# Patient Record
Sex: Female | Born: 1960 | Race: White | Hispanic: No | Marital: Married | Smoking: Former smoker
Health system: Southern US, Community
[De-identification: ages and names within clinical notes are randomized; demographics above are authoritative.]

## PROBLEM LIST (undated history)

## (undated) ENCOUNTER — Emergency Department (HOSPITAL_COMMUNITY): Discharge status: Absent without leave | Payer: Commercial Managed Care - HMO

## (undated) DIAGNOSIS — J45909 Unspecified asthma, uncomplicated: Secondary | ICD-10-CM

## (undated) DIAGNOSIS — E785 Hyperlipidemia, unspecified: Secondary | ICD-10-CM

## (undated) DIAGNOSIS — T8859XA Other complications of anesthesia, initial encounter: Secondary | ICD-10-CM

## (undated) DIAGNOSIS — G709 Myoneural disorder, unspecified: Secondary | ICD-10-CM

## (undated) DIAGNOSIS — K219 Gastro-esophageal reflux disease without esophagitis: Secondary | ICD-10-CM

## (undated) DIAGNOSIS — J449 Chronic obstructive pulmonary disease, unspecified: Secondary | ICD-10-CM

## (undated) DIAGNOSIS — G4730 Sleep apnea, unspecified: Secondary | ICD-10-CM

## (undated) DIAGNOSIS — H269 Unspecified cataract: Secondary | ICD-10-CM

## (undated) DIAGNOSIS — R011 Cardiac murmur, unspecified: Secondary | ICD-10-CM

## (undated) DIAGNOSIS — R0600 Dyspnea, unspecified: Secondary | ICD-10-CM

## (undated) DIAGNOSIS — K754 Autoimmune hepatitis: Secondary | ICD-10-CM

## (undated) DIAGNOSIS — Z807 Family history of other malignant neoplasms of lymphoid, hematopoietic and related tissues: Secondary | ICD-10-CM

## (undated) DIAGNOSIS — K7581 Nonalcoholic steatohepatitis (NASH): Secondary | ICD-10-CM

## (undated) DIAGNOSIS — K7460 Unspecified cirrhosis of liver: Secondary | ICD-10-CM

## (undated) DIAGNOSIS — Z860100 Personal history of colon polyps, unspecified: Secondary | ICD-10-CM

## (undated) DIAGNOSIS — Z8049 Family history of malignant neoplasm of other genital organs: Secondary | ICD-10-CM

## (undated) DIAGNOSIS — I2699 Other pulmonary embolism without acute cor pulmonale: Secondary | ICD-10-CM

## (undated) DIAGNOSIS — Z7901 Long term (current) use of anticoagulants: Secondary | ICD-10-CM

## (undated) DIAGNOSIS — Z801 Family history of malignant neoplasm of trachea, bronchus and lung: Secondary | ICD-10-CM

## (undated) DIAGNOSIS — Z800 Family history of malignant neoplasm of digestive organs: Secondary | ICD-10-CM

## (undated) DIAGNOSIS — E119 Type 2 diabetes mellitus without complications: Secondary | ICD-10-CM

## (undated) DIAGNOSIS — Z87442 Personal history of urinary calculi: Secondary | ICD-10-CM

## (undated) DIAGNOSIS — D649 Anemia, unspecified: Secondary | ICD-10-CM

## (undated) DIAGNOSIS — M1990 Unspecified osteoarthritis, unspecified site: Secondary | ICD-10-CM

## (undated) DIAGNOSIS — Z5189 Encounter for other specified aftercare: Secondary | ICD-10-CM

## (undated) DIAGNOSIS — Z806 Family history of leukemia: Secondary | ICD-10-CM

## (undated) HISTORY — DX: Cardiac murmur, unspecified: R01.1

## (undated) HISTORY — PX: SPINE SURGERY: SHX786

## (undated) HISTORY — DX: Unspecified cirrhosis of liver: K74.60

## (undated) HISTORY — DX: Unspecified osteoarthritis, unspecified site: M19.90

## (undated) HISTORY — DX: Chronic obstructive pulmonary disease, unspecified: J44.9

## (undated) HISTORY — DX: Family history of malignant neoplasm of digestive organs: Z80.0

## (undated) HISTORY — DX: Personal history of colonic polyps: Z86.010

## (undated) HISTORY — PX: EYE SURGERY: SHX253

## (undated) HISTORY — PX: WRIST SURGERY: SHX841

## (undated) HISTORY — DX: Hyperlipidemia, unspecified: E78.5

## (undated) HISTORY — DX: Family history of leukemia: Z80.6

## (undated) HISTORY — DX: Type 2 diabetes mellitus without complications: E11.9

## (undated) HISTORY — DX: Family history of other malignant neoplasms of lymphoid, hematopoietic and related tissues: Z80.7

## (undated) HISTORY — DX: Anemia, unspecified: D64.9

## (undated) HISTORY — PX: CATARACT EXTRACTION, BILATERAL: SHX1313

## (undated) HISTORY — DX: Gastro-esophageal reflux disease without esophagitis: K21.9

## (undated) HISTORY — DX: Unspecified cataract: H26.9

## (undated) HISTORY — DX: Other pulmonary embolism without acute cor pulmonale: I26.99

## (undated) HISTORY — DX: Myoneural disorder, unspecified: G70.9

## (undated) HISTORY — DX: Family history of malignant neoplasm of trachea, bronchus and lung: Z80.1

## (undated) HISTORY — DX: Autoimmune hepatitis: K75.4

## (undated) HISTORY — PX: CHOLECYSTECTOMY: SHX55

## (undated) HISTORY — DX: Nonalcoholic steatohepatitis (NASH): K75.81

## (undated) HISTORY — PX: BREAST BIOPSY: SHX20

## (undated) HISTORY — PX: TUBAL LIGATION: SHX77

## (undated) HISTORY — DX: Personal history of colon polyps, unspecified: Z86.0100

## (undated) HISTORY — DX: Family history of malignant neoplasm of other genital organs: Z80.49

## (undated) HISTORY — DX: Long term (current) use of anticoagulants: Z79.01

## (undated) HISTORY — DX: Encounter for other specified aftercare: Z51.89

---

## 1998-05-31 ENCOUNTER — Other Ambulatory Visit: Admission: RE | Admit: 1998-05-31 | Discharge: 1998-05-31 | Payer: Self-pay | Admitting: Obstetrics and Gynecology

## 1999-07-04 ENCOUNTER — Encounter: Payer: Self-pay | Admitting: Surgery

## 1999-07-04 ENCOUNTER — Ambulatory Visit (HOSPITAL_COMMUNITY): Admission: RE | Admit: 1999-07-04 | Discharge: 1999-07-04 | Payer: Self-pay | Admitting: Surgery

## 2000-01-12 ENCOUNTER — Encounter: Payer: Self-pay | Admitting: Surgery

## 2000-01-18 ENCOUNTER — Observation Stay (HOSPITAL_COMMUNITY): Admission: RE | Admit: 2000-01-18 | Discharge: 2000-01-18 | Payer: Self-pay | Admitting: Surgery

## 2000-01-18 ENCOUNTER — Encounter: Payer: Self-pay | Admitting: Surgery

## 2000-01-18 ENCOUNTER — Encounter (INDEPENDENT_AMBULATORY_CARE_PROVIDER_SITE_OTHER): Payer: Self-pay | Admitting: Specialist

## 2000-02-06 ENCOUNTER — Encounter: Payer: Self-pay | Admitting: Family Medicine

## 2000-02-06 ENCOUNTER — Ambulatory Visit (HOSPITAL_COMMUNITY): Admission: RE | Admit: 2000-02-06 | Discharge: 2000-02-06 | Payer: Self-pay | Admitting: Family Medicine

## 2000-09-10 ENCOUNTER — Emergency Department (HOSPITAL_COMMUNITY): Admission: EM | Admit: 2000-09-10 | Discharge: 2000-09-10 | Payer: Self-pay

## 2000-12-10 ENCOUNTER — Encounter: Payer: Self-pay | Admitting: Gastroenterology

## 2000-12-10 ENCOUNTER — Encounter: Admission: RE | Admit: 2000-12-10 | Discharge: 2000-12-10 | Payer: Self-pay | Admitting: Gastroenterology

## 2001-02-08 ENCOUNTER — Ambulatory Visit (HOSPITAL_COMMUNITY): Admission: RE | Admit: 2001-02-08 | Discharge: 2001-02-08 | Payer: Self-pay | Admitting: Gastroenterology

## 2003-06-24 ENCOUNTER — Other Ambulatory Visit: Admission: RE | Admit: 2003-06-24 | Discharge: 2003-06-24 | Payer: Self-pay | Admitting: Family Medicine

## 2003-07-14 ENCOUNTER — Ambulatory Visit (HOSPITAL_COMMUNITY): Admission: RE | Admit: 2003-07-14 | Discharge: 2003-07-14 | Payer: Self-pay | Admitting: Cardiovascular Disease

## 2004-08-11 ENCOUNTER — Other Ambulatory Visit: Admission: RE | Admit: 2004-08-11 | Discharge: 2004-08-11 | Payer: Self-pay | Admitting: Family Medicine

## 2004-12-30 ENCOUNTER — Inpatient Hospital Stay (HOSPITAL_COMMUNITY): Admission: RE | Admit: 2004-12-30 | Discharge: 2005-01-01 | Payer: Self-pay | Admitting: Neurosurgery

## 2005-02-28 ENCOUNTER — Encounter: Admission: RE | Admit: 2005-02-28 | Discharge: 2005-04-11 | Payer: Self-pay | Admitting: Neurosurgery

## 2005-04-27 ENCOUNTER — Ambulatory Visit (HOSPITAL_COMMUNITY): Admission: RE | Admit: 2005-04-27 | Discharge: 2005-04-27 | Payer: Self-pay | Admitting: Neurosurgery

## 2005-05-16 ENCOUNTER — Inpatient Hospital Stay (HOSPITAL_COMMUNITY): Admission: RE | Admit: 2005-05-16 | Discharge: 2005-05-19 | Payer: Self-pay | Admitting: Neurosurgery

## 2005-09-04 ENCOUNTER — Other Ambulatory Visit: Admission: RE | Admit: 2005-09-04 | Discharge: 2005-09-04 | Payer: Self-pay | Admitting: Family Medicine

## 2005-09-25 ENCOUNTER — Ambulatory Visit: Payer: Self-pay | Admitting: Hematology and Oncology

## 2006-01-23 ENCOUNTER — Ambulatory Visit: Payer: Self-pay | Admitting: Hematology and Oncology

## 2006-02-20 ENCOUNTER — Ambulatory Visit (HOSPITAL_COMMUNITY): Admission: RE | Admit: 2006-02-20 | Discharge: 2006-02-20 | Payer: Self-pay | Admitting: Family Medicine

## 2006-07-25 ENCOUNTER — Ambulatory Visit (HOSPITAL_COMMUNITY): Admission: RE | Admit: 2006-07-25 | Discharge: 2006-07-25 | Payer: Self-pay | Admitting: Neurosurgery

## 2006-09-21 ENCOUNTER — Inpatient Hospital Stay (HOSPITAL_COMMUNITY): Admission: RE | Admit: 2006-09-21 | Discharge: 2006-09-22 | Payer: Self-pay | Admitting: Neurosurgery

## 2007-04-19 ENCOUNTER — Ambulatory Visit: Payer: Self-pay | Admitting: Hematology and Oncology

## 2007-04-24 LAB — CBC WITH DIFFERENTIAL/PLATELET
BASO%: 0.3 % (ref 0.0–2.0)
Basophils Absolute: 0 10*3/uL (ref 0.0–0.1)
EOS%: 0.4 % (ref 0.0–7.0)
Eosinophils Absolute: 0 10*3/uL (ref 0.0–0.5)
HCT: 41.7 % (ref 34.8–46.6)
HGB: 15 g/dL (ref 11.6–15.9)
LYMPH%: 20.7 % (ref 14.0–48.0)
MCH: 31.1 pg (ref 26.0–34.0)
MCHC: 35.8 g/dL (ref 32.0–36.0)
MCV: 86.7 fL (ref 81.0–101.0)
MONO#: 0.3 10*3/uL (ref 0.1–0.9)
MONO%: 2.7 % (ref 0.0–13.0)
NEUT#: 9.7 10*3/uL — ABNORMAL HIGH (ref 1.5–6.5)
NEUT%: 75.9 % (ref 39.6–76.8)
Platelets: 260 10*3/uL (ref 145–400)
RBC: 4.81 10*6/uL (ref 3.70–5.32)
RDW: 14.9 % — ABNORMAL HIGH (ref 11.3–14.5)
WBC: 12.8 10*3/uL — ABNORMAL HIGH (ref 3.9–10.0)
lymph#: 2.7 10*3/uL (ref 0.9–3.3)

## 2007-04-24 LAB — COMPREHENSIVE METABOLIC PANEL
ALT: 14 U/L (ref 0–35)
AST: 11 U/L (ref 0–37)
Albumin: 3.8 g/dL (ref 3.5–5.2)
Alkaline Phosphatase: 87 U/L (ref 39–117)
BUN: 9 mg/dL (ref 6–23)
CO2: 24 mEq/L (ref 19–32)
Calcium: 9.2 mg/dL (ref 8.4–10.5)
Chloride: 103 mEq/L (ref 96–112)
Creatinine, Ser: 0.66 mg/dL (ref 0.40–1.20)
Glucose, Bld: 90 mg/dL (ref 70–99)
Potassium: 3.9 mEq/L (ref 3.5–5.3)
Sodium: 137 mEq/L (ref 135–145)
Total Bilirubin: 0.4 mg/dL (ref 0.3–1.2)
Total Protein: 6.7 g/dL (ref 6.0–8.3)

## 2007-04-24 LAB — APTT: aPTT: 41 seconds — ABNORMAL HIGH (ref 24–37)

## 2007-04-24 LAB — PROTHROMBIN TIME
INR: 2.5 — ABNORMAL HIGH (ref 0.0–1.5)
Prothrombin Time: 28.2 seconds — ABNORMAL HIGH (ref 11.6–15.2)

## 2007-07-01 ENCOUNTER — Ambulatory Visit: Payer: Self-pay | Admitting: Hematology and Oncology

## 2007-08-22 HISTORY — PX: ABDOMINAL HYSTERECTOMY: SHX81

## 2007-08-22 HISTORY — PX: INCONTINENCE SURGERY: SHX676

## 2010-09-11 ENCOUNTER — Encounter: Payer: Self-pay | Admitting: Family Medicine

## 2011-01-06 NOTE — Op Note (Signed)
NAMESARIYAH, CORCINO           ACCOUNT NO.:  [id number]   MEDICAL RECORD NO.:  [id number]          PATIENT TYPE:  INP   LOCATION:  3005                         FACILITY:  [facility]   PHYSICIAN:  Leeroy Cha, M.D.   DATE OF BIRTH:  [birth year]   DATE OF PROCEDURE:  09/21/2006  DATE OF DISCHARGE:                               OPERATIVE REPORT   PREOPERATIVE DIAGNOSIS:  Chronic S1 extensive radiculopathy, status post  L5-S1 fusion.   POSTOPERATIVE DIAGNOSIS:  Chronic S1 extensive radiculopathy, status  post L5-S1 fusion.   PROCEDURES:  1. Removal of hardware at the level of L5-S1.  2. Decompression of the L5 and S1 nerve roots.  3. Microscope used.   SURGEON:  Leeroy Cha, M.D.   ASSISTANT:  Earleen Newport, M.D.   CLINICAL HISTORY:  The patient was admitted because of back and left leg  numbness.  The patient had a fusion 2 years ago.  The patient had no  pain and no weakness.  Surgery was advised and the risks were explained  at the History and Physical.   DESCRIPTION OF PROCEDURES:  The patient was taken to the OR, and after  intubation, she was positioned in a prone manner.  The skin was cleaned  with DuraPrep.  Then, a midline incision resecting the previous scar was  made, and the incision was carried down until we found the hardware.  We  were able to take the tissue away from the screws, as well as from the  rod.  There was quite a bit of bone surrounding the hardware.  With the  drill, we removed the bone, and then the rod was removed first.  From  then on, we went ahead and did a lysis of adhesions to enter the L5-S1  disk space.  With the microscope and the drill, we found that both the  L5 and S1 nerve roots were tight in the foramina and the foramina were  quite stenotic.  Using the drill, as well as 1 and 2-mm Kerrison  punches, we did a bilateral foraminotomy to decompress both the L5 and  S1 nerve roots.  Lysis of adhesions was accomplished.  The x-rays  showed  that, indeed, both areas were open.  After we accomplished this, we  removed the 4 screws.  From then on, the area was irrigated.  Fentanyl  and Depo-Medrol were left in the L5-S1 area, and the wound was closed  with Vicryl and Steri-Strips.           ______________________________  Leeroy Cha, M.D.     EB/MEDQ  D:  09/21/2006  T:  09/21/2006  Job:  [id number]

## 2011-01-06 NOTE — Discharge Summary (Signed)
NAMEZERENITY, BOWRON           ACCOUNT NO.:  [id number]   MEDICAL RECORD NO.:  [id number]          PATIENT TYPE:  INP   LOCATION:  3014                         FACILITY:  [facility]   PHYSICIAN:  Leeroy Cha, M.D.   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   CLINICAL HISTORY:  The patient was admitted because of back with radiation  to both legs.  In the past, she has had two lumbar interventions.  X-rays  showed degenerative disc disease at L5-S1.  Surgery was advised.   LABORATORY:  Normal.   COURSE IN THE HOSPITAL:  The patient was taken to surgery and L5-S1  diskectomy followed by fusion was done.  The patient was nauseated right  after and having some difficulty with her bowel movements, but today she is  feeling much better.  She is afebrile.  She has been ambulatory, and she  wants to go home.   CONDITION ON DISCHARGE:  Improving.   MEDICATIONS:  Mepergan Forte and baclofen.   DIET:  Regular.   ACTIVITY:  Not to drive for at least two weeks.   FOLLOWUP:  She has an appointment to see me in three weeks.           ______________________________  Leeroy Cha, M.D.     EB/MEDQ  D:  05/19/2005  T:  05/19/2005  Job:  [id number]

## 2011-01-06 NOTE — H&P (Signed)
NAMEKYLEIGHA, MARKERT NO.:  [id number]   MEDICAL RECORD NO.:  [id number]          PATIENT TYPE:  INP   LOCATION:  3172                         FACILITY:  [facility]   PHYSICIAN:  Leeroy Cha, M.D.   DATE OF BIRTH:  01-07-1961   DATE OF ADMISSION:  09/21/2006  DATE OF DISCHARGE:                              HISTORY & PHYSICAL   HISTORY OF PRESENT ILLNESS:  Ms. Claycomb is a lady who back in  September of 2006 underwent L5-S1 discectomy with fusion with pedicle  screws.  The patient did really well.  The pain is gone, but she has  been complaining of great deal of numbness in the left leg.  The right  leg is completely normal.  The patient has arthritis of the iliosacral  joint, but the numbness and the burning sensation is going to the  outside of the left foot when describing her __________.  We tried all  possible treatments, and at the end we decided to, since she already has  completed fusion, to remove the hardware.   PAST MEDICAL HISTORY:  Lumbar fusion, L5-S1.   FAMILY HISTORY:  Unremarkable.   REVIEW OF SYSTEMS:  Positive for back and left leg numbness.   PHYSICAL EXAMINATION:  HEENT:  The head, nose, and throat normal.  NECK:  Normal.  LUNGS:  Clear.  HEART:  Heart sounds normal.  ABDOMEN:  Normal.  EXTREMITIES:  Normal pulse.  NEURO EXAMINATION:  Is completely normal except that she has a numbness  sensation with burning pain in the left side of the left foot, which was  along with the S1 radiculopathy.   The x-ray showed that the screws are in good position.  There is no  evidence of any stenosis.   CLINICAL IMPRESSION:  Left S1 radiculopathy.   RECOMMENDATIONS:  The patient has failed every single conservative  treatment, we decided to proceed with surgery.  Surgery will be removal  of the hardware and do an L5-S1 foraminotomy.  She knows all the risks,  especially the possibility of no improvement whatsoever, infection, need  for  surgery.   ADDENDUM:  THE PATIENT IS ALLERGIC TO MORPHINE, DIFLUCAN, KEFLEX,  ACIPHEX, FLU VACCINE, AND NSAIDS.           ______________________________  Leeroy Cha, M.D.    EB/MEDQ  D:  09/21/2006  T:  09/21/2006  Job:  [id number]

## 2011-01-06 NOTE — Procedures (Signed)
[facility]  Patient:    Kimberly Obrien, Kimberly Obrien                  MRN: [id number] Proc. Date: 02/08/01 Adm. Date:  [id number] Attending:  Rafael Bihari CC:         Clois Dupes, M.D.   Procedure Report  PROCEDURE:  Esophagogastroduodenoscopy.  INDICATIONS FOR PROCEDURE:  Chronic abdominal pain with negative workup to date and no evidence of response to trials of Levbid and Vioxx. The procedure is to rule out acid peptic disease of the upper GI tract contributing to her symptoms.  DESCRIPTION OF PROCEDURE:  The patient was placed in the left lateral decubitus position then placed on the pulse monitor with continuous low flow oxygen delivered by nasal cannula. She was sedated with 40 mg of IV Demerol and 6 mg IV Versed. The Olympus video endoscope was advanced under direct vision into the oropharynx and esophagus. The esophagus was straight and of normal caliber at the squamocolumnar line at 38 cm. There was no visible hiatal hernia or ring, stricture or other abnormality of the gastroesophageal junction or distal esophagus. The stomach was entered a small amount of liquid secretions were suctioned from the fundus. Retroflexed view of the cardia was unremarkable. The fundus appeared normal. The body showed some punctate areas of erythema and some granularity with diffuse small raised areas consistent with a moderate gastritis. The appearance was most prominent in the body and less prominent in the antrum. The pylorus was nondeformed and easily allowed passage of the endoscope tip into the duodenum. Both the bulb and second portion were well inspected and appeared to be within normal limits. The scope was then withdrawn and the patient returned to the recovery room in stable condition. The patient tolerated the procedure well and there were no immediate complications.  IMPRESSION:  Mild gastritis involving the body of the stomach otherwise  normal endoscopy.  PLAN:  Await CLOtest and treat for eradication of Helicobacter if positive. DD:  02/08/01 TD:  02/08/01 Job: [id number]

## 2012-07-03 ENCOUNTER — Encounter (INDEPENDENT_AMBULATORY_CARE_PROVIDER_SITE_OTHER): Payer: Self-pay

## 2012-07-09 ENCOUNTER — Encounter (INDEPENDENT_AMBULATORY_CARE_PROVIDER_SITE_OTHER): Payer: Self-pay | Admitting: General Surgery

## 2012-07-11 ENCOUNTER — Ambulatory Visit (INDEPENDENT_AMBULATORY_CARE_PROVIDER_SITE_OTHER): Payer: Medicare Other | Admitting: General Surgery

## 2012-07-11 ENCOUNTER — Encounter (INDEPENDENT_AMBULATORY_CARE_PROVIDER_SITE_OTHER): Payer: Self-pay | Admitting: General Surgery

## 2012-07-11 VITALS — BP 122/76 | HR 72 | Temp 97.8°F | Resp 18 | Ht 66.0 in | Wt 180.6 lb

## 2012-07-11 DIAGNOSIS — M6200 Separation of muscle (nontraumatic), unspecified site: Secondary | ICD-10-CM

## 2012-07-11 DIAGNOSIS — M6208 Separation of muscle (nontraumatic), other site: Secondary | ICD-10-CM

## 2012-07-11 NOTE — Progress Notes (Signed)
Subjective:     Patient ID: Kimberly Obrien, female   DOB: February 23, 1961, 51 y.o.   MRN: [id number]  HPI The patient is a 51 year old female referred By Dr. Duane Boston for a ventral hernia. Patient states she's had this bulge for the last several months. She does state she has pain in this area. She is a previous history of having a cholecystectomy in the past currently is on a recent surgeries aside from back surgery. 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 Patient describes no signs or symptoms of constipation or nausea vomiting.  Review of Systems  Gastrointestinal: Positive for abdominal pain.  All other systems reviewed and are negative.       Objective:   Physical Exam  Constitutional: She is oriented to person, place, and time. She appears well-developed and well-nourished.  HENT:  Head: Normocephalic and atraumatic.  Eyes: Conjunctivae normal are normal. Pupils are equal, round, and reactive to light.  Neck: Neck supple.  Cardiovascular: Normal rate, regular rhythm and normal heart sounds.   Pulmonary/Chest: Effort normal and breath sounds normal.  Abdominal: Soft. Bowel sounds are normal. She exhibits no mass. There is no tenderness. There is no rebound and no guarding. Hernia confirmed negative in the ventral area.  Musculoskeletal: Normal range of motion.  Neurological: She is alert and oriented to person, place, and time.       Assessment:     The patient is a 51 year old female with rectus diastases. There is no ventral hernia.    Plan:     1. The patient is planned to continue with increased exercise activity to help with weight loss. I think this will help her to decrease the chance of her rectus diastasis widening. 2. Patient elected to have a abdominal binder to help support the anterior rectus diastases for exercise and. I think is a good idea. I will give her a prescription for abdominal binder which  she can feel at the DME store.

## 2012-09-12 ENCOUNTER — Other Ambulatory Visit: Payer: Self-pay | Admitting: Physician Assistant

## 2012-09-12 DIAGNOSIS — N6320 Unspecified lump in the left breast, unspecified quadrant: Secondary | ICD-10-CM

## 2012-09-25 ENCOUNTER — Ambulatory Visit (HOSPITAL_COMMUNITY)
Admission: RE | Admit: 2012-09-25 | Discharge: 2012-09-25 | Disposition: A | Discharge status: Home / Self Care | Payer: Medicare Other | Source: Ambulatory Visit | Attending: Physician Assistant | Admitting: Physician Assistant

## 2012-09-25 ENCOUNTER — Other Ambulatory Visit: Payer: Self-pay | Admitting: Physician Assistant

## 2012-09-25 DIAGNOSIS — N6320 Unspecified lump in the left breast, unspecified quadrant: Secondary | ICD-10-CM

## 2012-09-25 DIAGNOSIS — N63 Unspecified lump in unspecified breast: Secondary | ICD-10-CM | POA: Insufficient documentation

## 2012-09-25 DIAGNOSIS — R928 Other abnormal and inconclusive findings on diagnostic imaging of breast: Secondary | ICD-10-CM | POA: Insufficient documentation

## 2012-09-25 DIAGNOSIS — IMO0002 Reserved for concepts with insufficient information to code with codable children: Secondary | ICD-10-CM

## 2012-10-02 ENCOUNTER — Other Ambulatory Visit (HOSPITAL_COMMUNITY): Payer: Self-pay | Admitting: Physician Assistant

## 2012-10-02 ENCOUNTER — Ambulatory Visit (HOSPITAL_COMMUNITY): Payer: Medicare Other

## 2012-10-02 ENCOUNTER — Ambulatory Visit (HOSPITAL_COMMUNITY)
Admission: RE | Admit: 2012-10-02 | Discharge: 2012-10-02 | Disposition: A | Discharge status: Home / Self Care | Payer: Medicare Other | Source: Ambulatory Visit | Attending: Physician Assistant | Admitting: Physician Assistant

## 2012-10-02 DIAGNOSIS — IMO0002 Reserved for concepts with insufficient information to code with codable children: Secondary | ICD-10-CM

## 2012-10-02 DIAGNOSIS — N6009 Solitary cyst of unspecified breast: Secondary | ICD-10-CM | POA: Insufficient documentation

## 2012-10-02 NOTE — Progress Notes (Signed)
Pt for left breast biopsy 3ml of 2% lidocaine given by MD.

## 2012-11-21 ENCOUNTER — Ambulatory Visit (INDEPENDENT_AMBULATORY_CARE_PROVIDER_SITE_OTHER): Payer: Medicare Other | Admitting: Pharmacist

## 2012-11-21 DIAGNOSIS — J811 Chronic pulmonary edema: Secondary | ICD-10-CM

## 2012-11-21 DIAGNOSIS — Z7901 Long term (current) use of anticoagulants: Secondary | ICD-10-CM

## 2012-11-21 LAB — POCT INR: INR: 2.6

## 2012-11-21 NOTE — Patient Instructions (Signed)
Anticoagulation Dose Instructions as of 11/21/2012     Glynis Smiles Tue Wed Thu Fri Sat   New Dose 2.5 mg 2.5 mg 2.5 mg 2.5 mg 2.5 mg 2.5 mg 2.5 mg    Description       Continue 2.5mg  [=1/2 tablet] by mouth daily.  Recheck protime/INR in 4 weeks.

## 2012-12-10 ENCOUNTER — Ambulatory Visit (INDEPENDENT_AMBULATORY_CARE_PROVIDER_SITE_OTHER): Payer: Medicare Other

## 2012-12-10 ENCOUNTER — Encounter: Payer: Self-pay | Admitting: Family Medicine

## 2012-12-10 ENCOUNTER — Ambulatory Visit (INDEPENDENT_AMBULATORY_CARE_PROVIDER_SITE_OTHER): Payer: Medicare Other | Admitting: Family Medicine

## 2012-12-10 VITALS — BP 124/77 | HR 78 | Temp 98.1°F | Ht 66.0 in | Wt 189.8 lb

## 2012-12-10 DIAGNOSIS — R5383 Other fatigue: Secondary | ICD-10-CM

## 2012-12-10 DIAGNOSIS — R0609 Other forms of dyspnea: Secondary | ICD-10-CM

## 2012-12-10 DIAGNOSIS — M25519 Pain in unspecified shoulder: Secondary | ICD-10-CM

## 2012-12-10 DIAGNOSIS — M25512 Pain in left shoulder: Secondary | ICD-10-CM

## 2012-12-10 DIAGNOSIS — R0989 Other specified symptoms and signs involving the circulatory and respiratory systems: Secondary | ICD-10-CM

## 2012-12-10 DIAGNOSIS — R5381 Other malaise: Secondary | ICD-10-CM

## 2012-12-10 DIAGNOSIS — R0600 Dyspnea, unspecified: Secondary | ICD-10-CM

## 2012-12-10 LAB — POCT CBC
Granulocyte percent: 67.5 %G (ref 37–80)
HCT, POC: 43.6 % (ref 37.7–47.9)
Hemoglobin: 15.1 g/dL (ref 12.2–16.2)
Lymph, poc: 2.9 (ref 0.6–3.4)
MCH, POC: 29.4 pg (ref 27–31.2)
MCHC: 34.7 g/dL (ref 31.8–35.4)
MCV: 84.7 fL (ref 80–97)
MPV: 8.1 fL (ref 0–99.8)
POC Granulocyte: 6.8 (ref 2–6.9)
POC LYMPH PERCENT: 28.6 %L (ref 10–50)
Platelet Count, POC: 190 10*3/uL (ref 142–424)
RBC: 5.2 M/uL (ref 4.04–5.48)
RDW, POC: 12.7 %
WBC: 10 10*3/uL (ref 4.6–10.2)

## 2012-12-10 MED ORDER — BUDESONIDE-FORMOTEROL FUMARATE 160-4.5 MCG/ACT IN AERO: 2.0000 | INHALATION_SPRAY | Freq: Two times a day (BID) | RESPIRATORY_TRACT | Status: DC

## 2012-12-10 NOTE — Progress Notes (Signed)
Patient ID: Kimberly Obrien, female   DOB: Jan 09, 1961, 52 y.o.   MRN: [id number] SUBJECTIVE: HPI: Fatigue Patient complains of fatigue. Symptoms began several months ago. The patient feels the fatigue began with: gradually. Symptoms of her fatigue have been decreased libido, general malaise and poor athletic performance. Patient describes the following psychological symptoms: stress in the family. Patient denies change in hair texture, cold intolerance, excessive menstrual bleeding, fever, GI blood loss, symptoms of arthritis, unusual rashes and witnessed or suspected sleep apnea. Symptoms have stabilized. Symptom severity: struggles to carry out day to day responsibilities.. Previous visits for this problem: none.   Fatigue  Left shoulder pain x 1 week.  Ex-smoker.   PMH/PSH: reviewed/updated in Epic  SH/FH: reviewed/updated in Epic  Allergies: reviewed/updated in Epic  Medications: reviewed/updated in Epic  Immunizations: reviewed/updated in Epic  ROS: As above in the HPI. All other systems are stable or negative.  OBJECTIVE: APPEARANCE:  Patient in no acute distress.The patient appeared well nourished and normally developed. Acyanotic. Waist:obese VITAL SIGNS:BP 124/77  Pulse 78  Temp(Src) 98.1 F (36.7 C) (Oral)  Ht 5' 6"  (1.676 m)  Wt 189 lb 12.8 oz (86.093 kg)  BMI 30.65 kg/m2  SpO2 94%   SKIN: warm and  Dry without overt rashes, tattoos and scars  HEAD and Neck: without JVD, Head and scalp: normal Eyes:No scleral icterus. Fundi normal, eye movements normal. Ears: Auricle normal, canal normal, Tympanic membranes normal, insufflation normal. Nose: normal Throat: normal Neck & thyroid: normal  CHEST & LUNGS: Chest wall:left anterior shoulder swelling. Also swelling of the left pectoralis major.  Lungs: Clear  CVS: Reveals the PMI to be normally located. Regular rhythm, First and Second Heart sounds are normal,  absence of murmurs, rubs or  gallops. Peripheral vasculature: Radial pulses: normal Dorsal pedis pulses: normal Posterior pulses: normal  ABDOMEN:  Appearance: normal Benign,, no organomegaly, no masses, no Abdominal Aortic enlargement. No Guarding , no rebound. No Bruits. Bowel sounds: normal  RECTAL: N/A GU: N/A  EXTREMETIES: nonedematous. Both Femoral and Pedal pulses are normal.  MUSCULOSKELETAL:  Spine:scar with decreased ROM Joints: left shoulder with pain on internal rotation.  NEUROLOGIC: oriented to time,place and person; nonfocal. Strength is normal Sensory is normal Reflexes are normal Cranial Nerves are normal.  ASSESSMENT: Other malaise and fatigue - Plan: POCT CBC, COMPLETE METABOLIC PANEL WITH GFR, TSH, Vitamin D 25 hydroxy, Vitamin B12, Folate, EKG 12-Lead  Dyspnea - Plan: DG Chest 2 View, EKG 12-Lead  Pain in joint, shoulder region, left - Plan: DG Shoulder Left, Ambulatory referral to Physical Therapy   PLAN:  Orders Placed This Encounter  Procedures  . DG Chest 2 View    Standing Status: Future     Number of Occurrences: 1     Standing Expiration Date: 02/09/2014    Order Specific Question:  Reason for Exam (SYMPTOM  OR DIAGNOSIS REQUIRED)    Answer:  ex-smoker    Order Specific Question:  Reason for Exam (SYMPTOM  OR DIAGNOSIS REQUIRED)    Answer:  dyspnea    Order Specific Question:  Is the patient pregnant?    Answer:  No    Order Specific Question:  Preferred imaging location?    Answer:  Internal  . DG Shoulder Left    Standing Status: Future     Number of Occurrences: 1     Standing Expiration Date: 02/09/2014    Order Specific Question:  Reason for Exam (SYMPTOM  OR DIAGNOSIS REQUIRED)  Answer:  left shoulder pain    Order Specific Question:  Is the patient pregnant?    Answer:  No    Order Specific Question:  Preferred imaging location?    Answer:  Internal  . COMPLETE METABOLIC PANEL WITH GFR  . TSH  . Vitamin D 25 hydroxy  . Vitamin B12  . Folate  .  Ambulatory referral to Physical Therapy    Referral Priority:  Routine    Referral Type:  Physical Medicine    Referral Reason:  Specialty Services Required    Requested Specialty:  Physical Therapy    Number of Visits Requested:  1  . POCT CBC  . EKG 12-Lead   Results for orders placed in visit on 12/10/12 (from the past 24 hour(s))  POCT CBC     Status: None   Collection Time    12/10/12  4:46 PM      Result Value Range   WBC 10.0  4.6 - 10.2 K/uL   Lymph, poc 2.9  0.6 - 3.4   POC LYMPH PERCENT 28.6  10 - 50 %L   POC Granulocyte 6.8  2 - 6.9   Granulocyte percent 67.5  37 - 80 %G   RBC 5.2  4.04 - 5.48 M/uL   Hemoglobin 15.1  12.2 - 16.2 g/dL   HCT, POC 43.6  37.7 - 47.9 %   MCV 84.7  80 - 97 fL   MCH, POC 29.4  27 - 31.2 pg   MCHC 34.7  31.8 - 35.4 g/dL   RDW, POC 12.7     Platelet Count, POC 190.0  142 - 424 K/uL   MPV 8.1  0 - 99.8 fL   Meds ordered this encounter  Medications  . budesonide-formoterol (SYMBICORT) 160-4.5 MCG/ACT inhaler    Sig: Inhale 2 puffs into the lungs 2 (two) times daily.    Dispense:  1 Inhaler    Refill:  3  [facility] reading (PRIMARY) by  Dr.Keniya Schlotterbeck Jacelyn Grip: Chronic Changes on the CXRay,   Left shoulder Xray: mild AC joint degenerative changes.          Ice to the area,  PT referral.  Await work up.      Dr Paula Libra Recommendations  Diet and Exercise discussed with patient.  For nutrition information, I recommend books:  1).Eat to Live by Dr Excell Seltzer. 2).Prevent and Reverse Heart Disease by Dr Karl Luke.  Exercise recommendations are:  If unable to walk, then the patient can exercise in a chair 3 times a day. By flapping arms like a bird gently and raising legs outwards to the front.  If ambulatory, the patient can go for walks for 30 minutes 3 times a week. Then increase the intensity and duration as tolerated.  Goal is to try to attain exercise frequency to 5 times a week.  If applicable: Best to perform resistance  exercises (machines or weights) 2 days a week and cardio type exercises 3 days per week.  RTC in 4 weeks. Suspect underlying stress and  Depression?  Halston Kintz P. Jacelyn Grip, M.D.

## 2012-12-10 NOTE — Patient Instructions (Addendum)
      Dr Paula Libra Recommendations  Diet and Exercise discussed with patient.  For nutrition information, I recommend books:  1).Eat to Live by Dr Excell Seltzer. 2).Prevent and Reverse Heart Disease by Dr Karl Luke.  Exercise recommendations are:  If unable to walk, then the patient can exercise in a chair 3 times a day. By flapping arms like a bird gently and raising legs outwards to the front.  If ambulatory, the patient can go for walks for 30 minutes 3 times a week. Then increase the intensity and duration as tolerated.  Goal is to try to attain exercise frequency to 5 times a week.  If applicable: Best to perform resistance exercises (machines or weights) 2 days a week and cardio type exercises 3 days per week.

## 2012-12-11 LAB — COMPLETE METABOLIC PANEL WITH GFR
ALT: 57 U/L — ABNORMAL HIGH (ref 0–35)
AST: 42 U/L — ABNORMAL HIGH (ref 0–37)
Albumin: 4.4 g/dL (ref 3.5–5.2)
Alkaline Phosphatase: 95 U/L (ref 39–117)
BUN: 10 mg/dL (ref 6–23)
CO2: 27 mEq/L (ref 19–32)
Calcium: 9.5 mg/dL (ref 8.4–10.5)
Chloride: 104 mEq/L (ref 96–112)
Creat: 0.78 mg/dL (ref 0.50–1.10)
GFR, Est African American: >89 mL/min
GFR, Est Non African American: 88 mL/min
Glucose, Bld: 90 mg/dL (ref 70–99)
Potassium: 3.9 mEq/L (ref 3.5–5.3)
Sodium: 141 mEq/L (ref 135–145)
Total Bilirubin: 0.6 mg/dL (ref 0.3–1.2)
Total Protein: 6.8 g/dL (ref 6.0–8.3)

## 2012-12-11 LAB — TSH: TSH: 1.252 u[IU]/mL (ref 0.350–4.500)

## 2012-12-11 LAB — FOLATE: Folate: 7.4 ng/mL

## 2012-12-11 LAB — VITAMIN B12: Vitamin B-12: 382 pg/mL (ref 211–911)

## 2012-12-11 LAB — VITAMIN D 25 HYDROXY (VIT D DEFICIENCY, FRACTURES): Vit D, 25-Hydroxy: 17 ng/mL — ABNORMAL LOW (ref 30–89)

## 2012-12-13 ENCOUNTER — Other Ambulatory Visit: Payer: Self-pay | Admitting: Family Medicine

## 2012-12-13 DIAGNOSIS — R748 Abnormal levels of other serum enzymes: Secondary | ICD-10-CM

## 2012-12-13 NOTE — Progress Notes (Signed)
Quick Note:  Labs abnormal. Liver enzymes abnormal, needs additional labs.Ordered in Epic. Also Vit D still too low. Needs to increase the Vitamin D to 4000 IU daily.  ______

## 2012-12-16 ENCOUNTER — Other Ambulatory Visit: Payer: Medicare Other

## 2012-12-17 ENCOUNTER — Other Ambulatory Visit (INDEPENDENT_AMBULATORY_CARE_PROVIDER_SITE_OTHER): Payer: Medicare Other

## 2012-12-17 ENCOUNTER — Ambulatory Visit: Payer: Medicare Other | Attending: Family Medicine | Admitting: Physical Therapy

## 2012-12-17 DIAGNOSIS — R7402 Elevation of levels of lactic acid dehydrogenase (LDH): Secondary | ICD-10-CM

## 2012-12-17 DIAGNOSIS — M25519 Pain in unspecified shoulder: Secondary | ICD-10-CM | POA: Insufficient documentation

## 2012-12-17 DIAGNOSIS — R748 Abnormal levels of other serum enzymes: Secondary | ICD-10-CM

## 2012-12-17 DIAGNOSIS — IMO0001 Reserved for inherently not codable concepts without codable children: Secondary | ICD-10-CM | POA: Insufficient documentation

## 2012-12-17 DIAGNOSIS — R5381 Other malaise: Secondary | ICD-10-CM | POA: Insufficient documentation

## 2012-12-17 DIAGNOSIS — R7401 Elevation of levels of liver transaminase levels: Secondary | ICD-10-CM

## 2012-12-17 LAB — HEPATIC FUNCTION PANEL
ALT: 47 U/L — ABNORMAL HIGH (ref 0–35)
AST: 37 U/L (ref 0–37)
Albumin: 4.2 g/dL (ref 3.5–5.2)
Alkaline Phosphatase: 94 U/L (ref 39–117)
Bilirubin, Direct: 0.2 mg/dL (ref 0.0–0.3)
Indirect Bilirubin: 0.7 mg/dL (ref 0.0–0.9)
Total Bilirubin: 0.9 mg/dL (ref 0.3–1.2)
Total Protein: 6.8 g/dL (ref 6.0–8.3)

## 2012-12-17 LAB — FERRITIN: Ferritin: 347 ng/mL — ABNORMAL HIGH (ref 10–291)

## 2012-12-18 LAB — ANA: Anti Nuclear Antibody(ANA): POSITIVE — AB

## 2012-12-18 LAB — HEPATITIS PANEL, ACUTE
HCV Ab: NEGATIVE
Hep A IgM: NEGATIVE
Hep B C IgM: NEGATIVE
Hepatitis B Surface Ag: NEGATIVE

## 2012-12-18 LAB — CERULOPLASMIN: Ceruloplasmin: 28 mg/dL (ref 20–60)

## 2012-12-18 LAB — ALPHA-1-ANTITRYPSIN: A-1 Antitrypsin, Ser: 142 mg/dL (ref 90–200)

## 2012-12-18 LAB — ANTI-NUCLEAR AB-TITER (ANA TITER): ANA Titer 1: 1:40 {titer} — ABNORMAL HIGH

## 2012-12-19 ENCOUNTER — Other Ambulatory Visit: Payer: Self-pay | Admitting: Nurse Practitioner

## 2012-12-19 ENCOUNTER — Ambulatory Visit: Payer: Medicare Other | Admitting: Physical Therapy

## 2012-12-19 DIAGNOSIS — R768 Other specified abnormal immunological findings in serum: Secondary | ICD-10-CM

## 2012-12-23 ENCOUNTER — Ambulatory Visit (INDEPENDENT_AMBULATORY_CARE_PROVIDER_SITE_OTHER): Payer: Medicare Other | Admitting: Pharmacist

## 2012-12-23 DIAGNOSIS — J811 Chronic pulmonary edema: Secondary | ICD-10-CM

## 2012-12-23 LAB — POCT INR: INR: 2.2

## 2012-12-23 NOTE — Patient Instructions (Signed)
Anticoagulation Dose Instructions as of 12/23/2012     Kimberly Obrien Tue Wed Thu Fri Sat   New Dose 2.5 mg 2.5 mg 2.5 mg 2.5 mg 2.5 mg 2.5 mg 2.5 mg    Description       Continue 2.5mg  [=1/2 tablet] by mouth daily.  Recheck protime/INR in 6 weeks.       INR was 2.2 today

## 2013-01-09 ENCOUNTER — Ambulatory Visit: Payer: Medicare Other | Admitting: Family Medicine

## 2013-01-23 ENCOUNTER — Encounter: Payer: Self-pay | Admitting: *Deleted

## 2013-02-04 ENCOUNTER — Ambulatory Visit: Payer: Self-pay | Admitting: Family Medicine

## 2013-02-04 ENCOUNTER — Encounter: Payer: Self-pay | Admitting: Family Medicine

## 2013-02-04 ENCOUNTER — Ambulatory Visit (INDEPENDENT_AMBULATORY_CARE_PROVIDER_SITE_OTHER): Payer: Medicare Other | Admitting: Family Medicine

## 2013-02-04 VITALS — BP 132/79 | HR 80 | Temp 97.5°F | Wt 188.2 lb

## 2013-02-04 DIAGNOSIS — M25561 Pain in right knee: Secondary | ICD-10-CM | POA: Insufficient documentation

## 2013-02-04 DIAGNOSIS — R21 Rash and other nonspecific skin eruption: Secondary | ICD-10-CM | POA: Insufficient documentation

## 2013-02-04 DIAGNOSIS — E559 Vitamin D deficiency, unspecified: Secondary | ICD-10-CM | POA: Insufficient documentation

## 2013-02-04 DIAGNOSIS — F411 Generalized anxiety disorder: Secondary | ICD-10-CM | POA: Insufficient documentation

## 2013-02-04 DIAGNOSIS — Z86711 Personal history of pulmonary embolism: Secondary | ICD-10-CM | POA: Insufficient documentation

## 2013-02-04 DIAGNOSIS — J811 Chronic pulmonary edema: Secondary | ICD-10-CM

## 2013-02-04 DIAGNOSIS — M25569 Pain in unspecified knee: Secondary | ICD-10-CM

## 2013-02-04 DIAGNOSIS — R5383 Other fatigue: Secondary | ICD-10-CM | POA: Insufficient documentation

## 2013-02-04 DIAGNOSIS — G8929 Other chronic pain: Secondary | ICD-10-CM | POA: Insufficient documentation

## 2013-02-04 DIAGNOSIS — E785 Hyperlipidemia, unspecified: Secondary | ICD-10-CM | POA: Insufficient documentation

## 2013-02-04 DIAGNOSIS — M25559 Pain in unspecified hip: Secondary | ICD-10-CM

## 2013-02-04 DIAGNOSIS — G4700 Insomnia, unspecified: Secondary | ICD-10-CM | POA: Insufficient documentation

## 2013-02-04 DIAGNOSIS — R5381 Other malaise: Secondary | ICD-10-CM

## 2013-02-04 HISTORY — DX: Personal history of pulmonary embolism: Z86.711

## 2013-02-04 LAB — POCT CBC
Granulocyte percent: 71.3 %G (ref 37–80)
HCT, POC: 45.8 % (ref 37.7–47.9)
Hemoglobin: 16.2 g/dL (ref 12.2–16.2)
Lymph, poc: 2.4 (ref 0.6–3.4)
MCH, POC: 29.9 pg (ref 27–31.2)
MCHC: 35.4 g/dL (ref 31.8–35.4)
MCV: 84.4 fL (ref 80–97)
MPV: 7.9 fL (ref 0–99.8)
POC Granulocyte: 7.2 — AB (ref 2–6.9)
POC LYMPH PERCENT: 23.3 %L (ref 10–50)
Platelet Count, POC: 198 10*3/uL (ref 142–424)
RBC: 5.4 M/uL (ref 4.04–5.48)
RDW, POC: 14 %
WBC: 10.1 10*3/uL (ref 4.6–10.2)

## 2013-02-04 LAB — CK: Total CK: 65 U/L (ref 7–177)

## 2013-02-04 LAB — POCT INR: INR: 1.6

## 2013-02-04 MED ORDER — DOXEPIN HCL 6 MG PO TABS: 6.0000 mg | ORAL_TABLET | Freq: Every day | ORAL | Status: DC

## 2013-02-04 NOTE — Progress Notes (Signed)
Patient ID: Kimberly Obrien, female   DOB: [birth year], 52 y.o.   MRN: [id number] SUBJECTIVE: CC: Chief Complaint  Patient presents with  . Follow-up    ck protime  and follow up     HPI: Ate salads and it messed up her protime.Couldn't resist it Stress at home Brother-in-law died. Myalgias rheumatologist who is doing additional tests for her + ANA. He thinks it is not significant and that she has fibromyalgias. Awaiting that report Doesn't sleep well.   PMH/PSH: reviewed/updated in Epic  SH/FH: reviewed/updated in Epic  Allergies: reviewed/updated in Epic  Medications: reviewed/updated in Epic  Immunizations: reviewed/updated in Epic  ROS: As above in the HPI. All other systems are stable or negative.  OBJECTIVE: APPEARANCE:  Patient in no acute distress.The patient appeared well nourished and normally developed. Acyanotic. 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   SKIN: warm and  Dry without overt rashes, tattoos and scars  HEAD and Neck: without JVD, Head and scalp: normal Eyes:No scleral icterus. Fundi normal, eye movements normal. Ears: Auricle normal, canal normal, Tympanic membranes normal, insufflation normal. Nose: normal Throat: normal Neck & thyroid: normal  CHEST & LUNGS: Chest wall: normal Lungs: Clear  CVS: Reveals the PMI to be normally located. Regular rhythm, First and Second Heart sounds are normal,  absence of murmurs, rubs or gallops. Peripheral vasculature: Radial pulses: normal Dorsal pedis pulses: normal Posterior pulses: normal  ABDOMEN:  Appearance: normal Benign, no organomegaly, no masses, no Abdominal Aortic enlargement. No Guarding , no rebound. No Bruits. Bowel sounds: normal  RECTAL: N/A GU: N/A  EXTREMETIES: nonedematous. Both Femoral and Pedal pulses are normal.  MUSCULOSKELETAL:   Spine: normal Joints: left shoulder mild pain. Muscles sore with mild trigger positivity.  NEUROLOGIC: oriented to time,place and person; nonfocal. Strength is normal Sensory is normal Reflexes are normal Cranial Nerves are normal.  Results for orders placed in visit on 02/04/13  POCT INR      Result Value Range   INR 1.6    POCT CBC      Result Value Range   WBC 10.1  4.6 - 10.2 K/uL   Lymph, poc 2.4  0.6 - 3.4   POC LYMPH PERCENT 23.3  10 - 50 %L   POC Granulocyte 7.2 (*) 2 - 6.9   Granulocyte percent 71.3  37 - 80 %G   RBC 5.4  4.04 - 5.48 M/uL   Hemoglobin 16.2  12.2 - 16.2 g/dL   HCT, POC 45.8  37.7 - 47.9 %   MCV 84.4  80 - 97 fL   MCH, POC 29.9  27 - 31.2 pg   MCHC 35.4  31.8 - 35.4 g/dL   RDW, POC 14.0     Platelet Count, POC 198.0  142 - 424 K/uL   MPV 7.9  0 - 99.8 fL   Meds ordered this encounter  Medications  . Doxepin HCl 6 MG TABS    Sig: Take 1 tablet (6 mg total) by mouth at bedtime.    Dispense:  30 tablet    Refill:  3   Discussed coumadin adjustment, patient defers until 1 week when she will recheck and avoid the greens that she has been eating. Discussed risk for PE recurrence. She will consider Xarelto which was mentioned to her before.  Return in about  1 week (around 02/11/2013) for recheck protime.  Leobardo Granlund P. Jacelyn Grip, M.D.  ASSESSMENT: History of pulmonary embolism  Chronic pulmonary edema - Plan: POCT INR  Unspecified vitamin D deficiency - Plan: COMPLETE METABOLIC PANEL WITH GFR, Vitamin D 25 hydroxy  Rash and nonspecific skin eruption - Plan: Ambulatory referral to Dermatology  HLD (hyperlipidemia) - Plan: COMPLETE METABOLIC PANEL WITH GFR, NMR Lipoprofile with Lipids  Other malaise and fatigue - Plan: POCT CBC  Chronic arthralgias of knees and hips, unspecified laterality - Plan: COMPLETE METABOLIC PANEL WITH GFR, CK  Insomnia  Anxiety state, unspecified - Plan: Doxepin HCl 6 MG TABS   PLAN:  Orders Placed This Encounter   Procedures  . COMPLETE METABOLIC PANEL WITH GFR  . NMR Lipoprofile with Lipids  . Vitamin D 25 hydroxy  . CK  . Ambulatory referral to Dermatology    Referral Priority:  Routine    Referral Type:  Consultation    Referral Reason:  Specialty Services Required    Requested Specialty:  Dermatology    Number of Visits Requested:  1  . POCT CBC

## 2013-02-05 LAB — COMPLETE METABOLIC PANEL WITH GFR
ALT: 102 U/L — ABNORMAL HIGH (ref 0–35)
AST: 67 U/L — ABNORMAL HIGH (ref 0–37)
Albumin: 4.3 g/dL (ref 3.5–5.2)
Alkaline Phosphatase: 89 U/L (ref 39–117)
BUN: 10 mg/dL (ref 6–23)
CO2: 27 mEq/L (ref 19–32)
Calcium: 9.5 mg/dL (ref 8.4–10.5)
Chloride: 102 mEq/L (ref 96–112)
Creat: 0.8 mg/dL (ref 0.50–1.10)
GFR, Est African American: >89 mL/min
GFR, Est Non African American: 85 mL/min
Glucose, Bld: 118 mg/dL — ABNORMAL HIGH (ref 70–99)
Potassium: 3.8 mEq/L (ref 3.5–5.3)
Sodium: 141 mEq/L (ref 135–145)
Total Bilirubin: 0.8 mg/dL (ref 0.3–1.2)
Total Protein: 6.8 g/dL (ref 6.0–8.3)

## 2013-02-05 LAB — VITAMIN D 25 HYDROXY (VIT D DEFICIENCY, FRACTURES): Vit D, 25-Hydroxy: 46 ng/mL (ref 30–89)

## 2013-02-06 LAB — NMR LIPOPROFILE WITH LIPIDS
Cholesterol, Total: 208 mg/dL — ABNORMAL HIGH (ref <200)
HDL Particle Number: 25.7 umol/L — ABNORMAL LOW (ref >=30.5)
HDL Size: 8.8 nm — ABNORMAL LOW (ref >=9.2)
HDL-C: 40 mg/dL (ref >=40)
LDL (calc): 141 mg/dL — ABNORMAL HIGH (ref <100)
LDL Particle Number: 2128 nmol/L — ABNORMAL HIGH (ref <1000)
LDL Size: 20.8 nm (ref >20.5)
LP-IR Score: 63 — ABNORMAL HIGH (ref <=45)
Large HDL-P: 2.6 umol/L — ABNORMAL LOW (ref >=4.8)
Large VLDL-P: 2.1 nmol/L (ref <=2.7)
Small LDL Particle Number: 1207 nmol/L — ABNORMAL HIGH (ref <=527)
Triglycerides: 136 mg/dL (ref <150)
VLDL Size: 50.4 nm — ABNORMAL HIGH (ref <=46.6)

## 2013-02-07 NOTE — Progress Notes (Signed)
Quick Note:  Labs abnormal. Lipids high, liver transaminases high, patient on Coumadin, protime not at goal due to vegetable intake.  Recommend an appointment with the Clinical Pharmacist to review and Adjust meds and recommend meds to get to goals. Thanks. Jahree Dermody P. Jacelyn Grip, M.D.  ______

## 2013-02-24 ENCOUNTER — Ambulatory Visit (INDEPENDENT_AMBULATORY_CARE_PROVIDER_SITE_OTHER): Payer: Medicare Other | Admitting: Pharmacist

## 2013-02-24 DIAGNOSIS — Z86711 Personal history of pulmonary embolism: Secondary | ICD-10-CM

## 2013-02-24 DIAGNOSIS — J811 Chronic pulmonary edema: Secondary | ICD-10-CM

## 2013-02-24 LAB — POCT INR: INR: 1.9

## 2013-02-24 NOTE — Patient Instructions (Signed)
Anticoagulation Dose Instructions as of 02/24/2013     Kimberly Obrien Tue Wed Thu Fri Sat   New Dose 2.5 mg 2.5 mg 2.5 mg 2.5 mg 2.5 mg 2.5 mg 2.5 mg    Description       Take 1 tablet [= 5mg ] today.  Then continue 2.5mg  [=1/2 tablet] by mouth daily.  Recheck protime/INR in 6 weeks.       INR was 1.9 today

## 2013-03-24 ENCOUNTER — Other Ambulatory Visit: Payer: Self-pay | Admitting: *Deleted

## 2013-03-24 DIAGNOSIS — R928 Other abnormal and inconclusive findings on diagnostic imaging of breast: Secondary | ICD-10-CM

## 2013-03-27 ENCOUNTER — Ambulatory Visit (INDEPENDENT_AMBULATORY_CARE_PROVIDER_SITE_OTHER): Payer: Medicare Other | Admitting: Pharmacist

## 2013-03-27 DIAGNOSIS — J811 Chronic pulmonary edema: Secondary | ICD-10-CM

## 2013-03-27 LAB — POCT INR: INR: 2.2

## 2013-03-27 NOTE — Patient Instructions (Signed)
Anticoagulation Dose Instructions as of 03/27/2013     Kimberly Obrien Tue Wed Thu Fri Sat   New Dose 2.5 mg 2.5 mg 2.5 mg 2.5 mg 2.5 mg 2.5 mg 2.5 mg    Description       Continue 2.5mg  [=1/2 tablet] by mouth daily.  Recheck protime/INR in 6 weeks.       INR was 2.2 today

## 2013-03-27 NOTE — Progress Notes (Signed)
Discussed anticoagulation alternatives.  Gave written information about Xarelto.

## 2013-04-09 ENCOUNTER — Encounter (HOSPITAL_COMMUNITY): Payer: Medicare Other

## 2013-04-09 ENCOUNTER — Inpatient Hospital Stay (HOSPITAL_COMMUNITY): Admission: RE | Admit: 2013-04-09 | Payer: Medicare Other | Source: Ambulatory Visit

## 2013-04-16 ENCOUNTER — Encounter (HOSPITAL_COMMUNITY): Payer: Medicare Other

## 2013-04-16 ENCOUNTER — Ambulatory Visit (HOSPITAL_COMMUNITY)
Admission: RE | Admit: 2013-04-16 | Discharge: 2013-04-16 | Disposition: A | Discharge status: Home / Self Care | Payer: Medicare Other | Source: Ambulatory Visit | Attending: Family Medicine | Admitting: Family Medicine

## 2013-04-16 DIAGNOSIS — R928 Other abnormal and inconclusive findings on diagnostic imaging of breast: Secondary | ICD-10-CM

## 2013-04-16 DIAGNOSIS — Z09 Encounter for follow-up examination after completed treatment for conditions other than malignant neoplasm: Secondary | ICD-10-CM | POA: Insufficient documentation

## 2013-04-16 DIAGNOSIS — N6009 Solitary cyst of unspecified breast: Secondary | ICD-10-CM | POA: Insufficient documentation

## 2013-04-24 ENCOUNTER — Encounter: Payer: Self-pay | Admitting: *Deleted

## 2013-05-12 ENCOUNTER — Ambulatory Visit (INDEPENDENT_AMBULATORY_CARE_PROVIDER_SITE_OTHER): Payer: Medicare Other | Admitting: Pharmacist

## 2013-05-12 DIAGNOSIS — R7989 Other specified abnormal findings of blood chemistry: Secondary | ICD-10-CM

## 2013-05-12 DIAGNOSIS — J811 Chronic pulmonary edema: Secondary | ICD-10-CM

## 2013-05-12 LAB — POCT INR: INR: 2

## 2013-05-12 NOTE — Patient Instructions (Signed)
Anticoagulation Dose Instructions as of 05/12/2013     Kimberly Obrien Tue Wed Thu Fri Sat   New Dose 2.5 mg 2.5 mg 2.5 mg 2.5 mg 2.5 mg 2.5 mg 2.5 mg    Description       Continue 2.5mg  [=1/2 tablet] by mouth daily.  Recheck protime/INR in 6 weeks.      INR was 2.0 today

## 2013-05-12 NOTE — Progress Notes (Signed)
Patient c/o upper right quadrant pain in abdomen. History of elevated LFT's. Ordered ultrasound and patient to see Philomena Doheny in 2 weeks.  Also discussed changing to Xarelto.  Patient declined at this time.   Offered influenza vaccine - patient declined.

## 2013-05-20 ENCOUNTER — Other Ambulatory Visit: Payer: Self-pay | Admitting: *Deleted

## 2013-05-20 DIAGNOSIS — R7989 Other specified abnormal findings of blood chemistry: Secondary | ICD-10-CM

## 2013-05-21 ENCOUNTER — Ambulatory Visit (HOSPITAL_COMMUNITY)
Admission: RE | Admit: 2013-05-21 | Discharge: 2013-05-21 | Disposition: A | Discharge status: Home / Self Care | Payer: Medicare Other | Source: Ambulatory Visit | Attending: Family Medicine | Admitting: Family Medicine

## 2013-05-21 ENCOUNTER — Ambulatory Visit (HOSPITAL_COMMUNITY): Payer: Medicare Other

## 2013-05-21 DIAGNOSIS — R748 Abnormal levels of other serum enzymes: Secondary | ICD-10-CM | POA: Insufficient documentation

## 2013-05-21 DIAGNOSIS — R7989 Other specified abnormal findings of blood chemistry: Secondary | ICD-10-CM

## 2013-05-25 ENCOUNTER — Encounter: Payer: Self-pay | Admitting: Family Medicine

## 2013-05-25 DIAGNOSIS — K760 Fatty (change of) liver, not elsewhere classified: Secondary | ICD-10-CM | POA: Insufficient documentation

## 2013-05-25 NOTE — Progress Notes (Signed)
Quick Note:  Labs U/S: Fatty liver. This can cause cirrhosis. Did she see the rheumatologist for her arthralgias and positive ANA. Needs to see Tammy for dietary interventions and lipid management and weight loss. She sees her for coumadin management. I really think that she needs to consider the other medications for anticoagulation such as zarelto which was mentioned to her. So that she can go on a plant based diet. Due for follow up labs and visit.  ______

## 2013-05-27 ENCOUNTER — Other Ambulatory Visit: Payer: Medicare Other | Admitting: General Practice

## 2013-05-29 ENCOUNTER — Telehealth: Payer: Self-pay | Admitting: Family Medicine

## 2013-05-29 NOTE — Telephone Encounter (Signed)
Pt aware of labs and appt sch

## 2013-06-05 ENCOUNTER — Encounter: Payer: Self-pay | Admitting: *Deleted

## 2013-06-10 ENCOUNTER — Ambulatory Visit (INDEPENDENT_AMBULATORY_CARE_PROVIDER_SITE_OTHER): Payer: Medicare Other | Admitting: General Practice

## 2013-06-10 ENCOUNTER — Encounter (INDEPENDENT_AMBULATORY_CARE_PROVIDER_SITE_OTHER): Payer: Self-pay

## 2013-06-10 ENCOUNTER — Encounter: Payer: Self-pay | Admitting: General Practice

## 2013-06-10 VITALS — BP 111/65 | HR 85 | Temp 98.3°F | Ht <= 58 in | Wt 193.0 lb

## 2013-06-10 DIAGNOSIS — Z0000 Encounter for general adult medical examination without abnormal findings: Secondary | ICD-10-CM

## 2013-06-10 LAB — POCT URINALYSIS DIPSTICK
Bilirubin, UA: NEGATIVE
Glucose, UA: NEGATIVE
Ketones, UA: NEGATIVE
Leukocytes, UA: NEGATIVE
Nitrite, UA: NEGATIVE
Spec Grav, UA: 1.02
Urobilinogen, UA: NEGATIVE
pH, UA: 6

## 2013-06-10 LAB — POCT UA - MICROSCOPIC ONLY
Casts, Ur, LPF, POC: NEGATIVE
Crystals, Ur, HPF, POC: NEGATIVE
Yeast, UA: NEGATIVE

## 2013-06-10 NOTE — Progress Notes (Signed)
  Subjective:    Patient ID: Kimberly Obrien, female    DOB: 06/05/1961, 52 y.o.   MRN: [id number]  HPI Patient presents today for annual exam including pap. She denies having any problems or concerns at this time.     Review of Systems  Constitutional: Negative for fever and chills.  Respiratory: Negative for cough, chest tightness, shortness of breath and wheezing.   Cardiovascular: Negative for chest pain and palpitations.  Gastrointestinal: Negative for abdominal pain, diarrhea, constipation and blood in stool.  Genitourinary: Negative for dysuria, hematuria and difficulty urinating.  Musculoskeletal: Negative for back pain, neck pain and neck stiffness.  Neurological: Negative for dizziness, weakness and headaches.       Objective:   Physical Exam  Constitutional: She is oriented to person, place, and time. She appears well-developed and well-nourished.  HENT:  Head: Normocephalic and atraumatic.  Right Ear: External ear normal.  Left Ear: External ear normal.  Nose: Right sinus exhibits maxillary sinus tenderness and frontal sinus tenderness. Left sinus exhibits maxillary sinus tenderness and frontal sinus tenderness.  Mouth/Throat: Oropharynx is clear and moist.  Eyes: Conjunctivae and EOM are normal. Pupils are equal, round, and reactive to light.  Neck: Normal range of motion. Neck supple. No thyromegaly present.  Cardiovascular: Normal rate, regular rhythm, normal heart sounds and intact distal pulses.   Pulmonary/Chest: Effort normal and breath sounds normal. No respiratory distress. She exhibits no tenderness. Right breast exhibits no inverted nipple, no mass, no nipple discharge, no skin change and no tenderness. Left breast exhibits no inverted nipple, no mass, no nipple discharge, no skin change and no tenderness. Breasts are symmetrical.  Abdominal: Soft. Bowel sounds are normal. She exhibits no distension.  Genitourinary: Vagina normal. No breast swelling,  tenderness, discharge or bleeding. No labial fusion. There is no rash, tenderness, lesion or injury on the right labia. There is no rash, tenderness, lesion or injury on the left labia. No erythema, tenderness or bleeding around the vagina. No foreign body around the vagina. No signs of injury around the vagina. No vaginal discharge found.  Lymphadenopathy:    She has no cervical adenopathy.  Neurological: She is alert and oriented to person, place, and time.  Skin: Skin is warm and dry.  Psychiatric: She has a normal mood and affect.          Assessment & Plan:  1. Annual physical exam  - POCT urinalysis dipstick - POCT UA - Microscopic Only - Urine culture - Pap IG w/ reflex to HPV when ASC-U -RTO if symptoms develop -Patient verbalized understanding -Coralie Keens, FNP-C

## 2013-06-10 NOTE — Patient Instructions (Signed)

## 2013-06-10 NOTE — Addendum Note (Signed)
Addended by: Orma Render F on: 06/10/2013 06:11 PM   Modules accepted: Orders

## 2013-06-12 LAB — URINE CULTURE

## 2013-06-13 ENCOUNTER — Other Ambulatory Visit: Payer: Medicare Other

## 2013-06-13 LAB — PAP IG W/ RFLX HPV ASCU: PAP Smear Comment: 0

## 2013-06-13 NOTE — Addendum Note (Signed)
Addended by: Orma Render F on: 06/13/2013 05:07 PM   Modules accepted: Orders

## 2013-06-15 LAB — FECAL OCCULT BLOOD, IMMUNOCHEMICAL: Fecal Occult Bld: POSITIVE — AB

## 2013-06-19 ENCOUNTER — Other Ambulatory Visit: Payer: Self-pay | Admitting: General Practice

## 2013-06-19 ENCOUNTER — Other Ambulatory Visit (INDEPENDENT_AMBULATORY_CARE_PROVIDER_SITE_OTHER): Payer: Medicare Other

## 2013-06-19 DIAGNOSIS — R195 Other fecal abnormalities: Secondary | ICD-10-CM

## 2013-06-19 LAB — POCT CBC
Granulocyte percent: 69.7 %G (ref 37–80)
HCT, POC: 43.7 % (ref 37.7–47.9)
Hemoglobin: 14.6 g/dL (ref 12.2–16.2)
Lymph, poc: 3 (ref 0.6–3.4)
MCH, POC: 28.6 pg (ref 27–31.2)
MCHC: 33.4 g/dL (ref 31.8–35.4)
MCV: 85.6 fL (ref 80–97)
MPV: 7.9 fL (ref 0–99.8)
POC Granulocyte: 7.2 — AB (ref 2–6.9)
POC LYMPH PERCENT: 29 %L (ref 10–50)
Platelet Count, POC: 188 10*3/uL (ref 142–424)
RBC: 5.1 M/uL (ref 4.04–5.48)
RDW, POC: 13.5 %
WBC: 10.3 10*3/uL — AB (ref 4.6–10.2)

## 2013-06-19 NOTE — Progress Notes (Signed)
Patient came in for labs only.

## 2013-06-23 ENCOUNTER — Ambulatory Visit (INDEPENDENT_AMBULATORY_CARE_PROVIDER_SITE_OTHER): Payer: Medicare Other | Admitting: Family Medicine

## 2013-06-23 ENCOUNTER — Encounter: Payer: Self-pay | Admitting: Family Medicine

## 2013-06-23 VITALS — BP 120/61 | HR 70 | Temp 98.1°F | Ht 66.0 in | Wt 193.4 lb

## 2013-06-23 DIAGNOSIS — Z86711 Personal history of pulmonary embolism: Secondary | ICD-10-CM

## 2013-06-23 DIAGNOSIS — F411 Generalized anxiety disorder: Secondary | ICD-10-CM

## 2013-06-23 DIAGNOSIS — K760 Fatty (change of) liver, not elsewhere classified: Secondary | ICD-10-CM

## 2013-06-23 DIAGNOSIS — E559 Vitamin D deficiency, unspecified: Secondary | ICD-10-CM

## 2013-06-23 DIAGNOSIS — K7689 Other specified diseases of liver: Secondary | ICD-10-CM

## 2013-06-23 DIAGNOSIS — E785 Hyperlipidemia, unspecified: Secondary | ICD-10-CM

## 2013-06-23 DIAGNOSIS — Z1212 Encounter for screening for malignant neoplasm of rectum: Secondary | ICD-10-CM

## 2013-06-23 NOTE — Progress Notes (Signed)
SUBJECTIVE: CC: Chief Complaint  Patient presents with  . Follow-up    discuss labs and tests     HPI: Breakfast: sausage biscuit Lunch: salad, with dressing Supper:chicken tenders, salad at [employer].   patient had a U/S of the liver which showed Fatty liver. She has had at least 2 episodes of Pulmonary embolus and has lifelong anticoagulation. With the coumadin , it limits her from a plant based  Diet. Her diet is actually high in fat. She is obese and doesn't have an exercise program.   Past Medical History  Diagnosis Date  . Anticoagulation monitoring by pharmacist   . PE (pulmonary embolism)    Past Surgical History  Procedure Laterality Date  . Tubal ligation    . Wrist surgery Right     Had surgery twice to shave bone for blood circulation improvement.  . Cholecystectomy    . Spine surgery      L5 S1 fusion  . Abdominal hysterectomy    . Eye surgery      Cataracts   History   Social History  . Marital Status: Married    Spouse Name: N/A    Number of Children: N/A  . Years of Education: N/A   Occupational History  . Not on file.   Social History Main Topics  . Smoking status: Former Smoker    Quit date: 04/21/2012  . Smokeless tobacco: Not on file  . Alcohol Use: Not on file  . Drug Use: Not on file  . Sexual Activity: Not on file   Other Topics Concern  . Not on file   Social History Narrative  . No narrative on file   Family History  Problem Relation Age of Onset  . Cirrhosis Mother   . Hyperlipidemia Father    Current Outpatient Prescriptions on File Prior to Visit  Medication Sig Dispense Refill  . budesonide-formoterol (SYMBICORT) 160-4.5 MCG/ACT inhaler Inhale 2 puffs into the lungs 2 (two) times daily.  1 Inhaler  3  . cholecalciferol (VITAMIN D) 1000 UNITS tablet Take 1,000 Units by mouth daily. 4 week treatment.      . warfarin (COUMADIN) 5 MG tablet Take 5 mg by mouth daily.       No current facility-administered medications  on file prior to visit.   Allergies  Allergen Reactions  . Aciphex [Rabeprazole Sodium]   . Claritin [Loratadine]   . Diflucan [Fluconazole]   . Doxycycline   . Ibuprofen   . Keflet [Cephalexin]   . Macrobid [Nitrofurantoin Macrocrystal]   . Nsaids   . Septra [Sulfamethoxazole-Trimethoprim]     There is no immunization history on file for this patient. Prior to Admission medications   Medication Sig Start Date End Date Taking? Authorizing Provider  budesonide-formoterol (SYMBICORT) 160-4.5 MCG/ACT inhaler Inhale 2 puffs into the lungs 2 (two) times daily. 12/10/12   Vernie Shanks, MD  cholecalciferol (VITAMIN D) 1000 UNITS tablet Take 1,000 Units by mouth daily. 4 week treatment.    Historical Provider, MD  MILK THISTLE PO Take by mouth.    Historical Provider, MD  warfarin (COUMADIN) 5 MG tablet Take 5 mg by mouth daily.    Historical Provider, MD     ROS: As above in the HPI. All other systems are stable or negative.  OBJECTIVE: APPEARANCE:  Patient in no acute distress.The patient appeared well nourished and normally developed. Acyanotic. Waist: VITAL SIGNS:BP 120/61  Pulse 70  Temp(Src) 98.1 F (36.7 C) (Oral)  Ht 5' 6"  (  1.676 m)  Wt 193 lb 6.4 oz (87.726 kg)  BMI 31.23 kg/m2  Obese WF SKIN: warm and  Dry without overt rashes, tattoos and scars  HEAD and Neck: without JVD, Head and scalp: normal Eyes:No scleral icterus. Fundi normal, eye movements normal. Ears: Auricle normal, canal normal, Tympanic membranes normal, insufflation normal. Nose: normal Throat: normal Neck & thyroid: normal  CHEST & LUNGS: Chest wall: normal Lungs: Clear  CVS: Reveals the PMI to be normally located. Regular rhythm, First and Second Heart sounds are normal,  absence of murmurs, rubs or gallops. Peripheral vasculature: Radial pulses: normal Dorsal pedis pulses: normal Posterior pulses: normal  ABDOMEN:  Appearance: Obese Benign, no organomegaly, no masses, no  Abdominal Aortic enlargement. No Guarding , no rebound. No Bruits. Bowel sounds: normal  RECTAL: N/A GU: N/A  EXTREMETIES: nonedematous.  MUSCULOSKELETAL:  Spine: normal Joints: intact  NEUROLOGIC: oriented to time,place and person; nonfocal. Strength is normal Sensory is normal Reflexes are normal Cranial Nerves are normal.  ASSESSMENT: History of pulmonary embolism  Anxiety state, unspecified  HLD (hyperlipidemia)  Unspecified vitamin D deficiency  Fatty liver  Screening for malignant neoplasm of the rectum - Plan: Fecal occult blood, imunochemical  PLAN:  Orders Placed This Encounter  Procedures  . Fecal occult blood, imunochemical   No orders of the defined types were placed in this encounter.   Medications Discontinued During This Encounter  Medication Reason  . MILK THISTLE PO Patient has not taken in last 30 days       Dr Paula Libra Recommendations  For nutrition information, I recommend books:  1).Eat to Live by Dr Excell Seltzer. 2).Prevent and Reverse Heart Disease by Dr Karl Luke. 3) Dr Janene Harvey Book:  Program to Reverse Diabetes  Exercise recommendations are:  If unable to walk, then the patient can exercise in a chair 3 times a day. By flapping arms like a bird gently and raising legs outwards to the front.  If ambulatory, the patient can go for walks for 30 minutes 3 times a week. Then increase the intensity and duration as tolerated.  Goal is to try to attain exercise frequency to 5 times a week.  If applicable: Best to perform resistance exercises (machines or weights) 2 days a week and cardio type exercises 3 days per week.  Patient has an appointment with Tammy next week for Coumadin, and she is advised that she needs to discuss alternative medications for anticoagulation and dietary changes and statin choice and readjustment to the warfarin dose.   Return in about 7 days (around 06/30/2013) for has appointment on the  10th for Protime. needs additional appointment for dyslipidemia and fatty liv.  Jerl Munyan P. Jacelyn Grip, M.D.

## 2013-06-23 NOTE — Patient Instructions (Signed)
      Dr Sailor Hevia's Recommendations  For nutrition information, I recommend books:  1).Eat to Live by Dr Joel Fuhrman. 2).Prevent and Reverse Heart Disease by Dr Caldwell Esselstyn. 3) Dr Neal Barnard's Book:  Program to Reverse Diabetes  Exercise recommendations are:  If unable to walk, then the patient can exercise in a chair 3 times a day. By flapping arms like a bird gently and raising legs outwards to the front.  If ambulatory, the patient can go for walks for 30 minutes 3 times a week. Then increase the intensity and duration as tolerated.  Goal is to try to attain exercise frequency to 5 times a week.  If applicable: Best to perform resistance exercises (machines or weights) 2 days a week and cardio type exercises 3 days per week.  

## 2013-06-25 ENCOUNTER — Telehealth: Payer: Self-pay | Admitting: Pharmacist

## 2013-06-25 LAB — FECAL OCCULT BLOOD, IMMUNOCHEMICAL: Fecal Occult Bld: POSITIVE — AB

## 2013-06-25 NOTE — Telephone Encounter (Signed)
patietn called - she had appt about lipid appt.  Clarified.

## 2013-06-26 ENCOUNTER — Other Ambulatory Visit: Payer: Self-pay | Admitting: General Practice

## 2013-06-26 DIAGNOSIS — R710 Precipitous drop in hematocrit: Secondary | ICD-10-CM

## 2013-06-26 DIAGNOSIS — R195 Other fecal abnormalities: Secondary | ICD-10-CM

## 2013-06-30 ENCOUNTER — Ambulatory Visit (INDEPENDENT_AMBULATORY_CARE_PROVIDER_SITE_OTHER): Payer: Medicare Other | Admitting: Pharmacist

## 2013-06-30 DIAGNOSIS — J811 Chronic pulmonary edema: Secondary | ICD-10-CM

## 2013-06-30 DIAGNOSIS — K7689 Other specified diseases of liver: Secondary | ICD-10-CM

## 2013-06-30 DIAGNOSIS — E785 Hyperlipidemia, unspecified: Secondary | ICD-10-CM

## 2013-06-30 DIAGNOSIS — K760 Fatty (change of) liver, not elsewhere classified: Secondary | ICD-10-CM

## 2013-06-30 LAB — POCT INR: INR: 1.6

## 2013-06-30 MED ORDER — ROSUVASTATIN CALCIUM 5 MG PO TABS: 5.0000 mg | ORAL_TABLET | Freq: Every day | ORAL | Status: DC

## 2013-06-30 NOTE — Patient Instructions (Signed)
Anticoagulation Dose Instructions as of 06/30/2013     Glynis Smiles Tue Wed Thu Fri Sat   New Dose 2.5 mg 2.5 mg 2.5 mg 2.5 mg 2.5 mg 2.5 mg 2.5 mg    Description       Since starting Crestor which can increase  INR/Protime will have patient take 1 tablet of warfarin today, then continue current warfarin dose and recheck in 7 - 10 days.       INR was 1.6 (too thick)

## 2013-06-30 NOTE — Progress Notes (Signed)
Lipid Clinic Consultation  Chief Complaint:   Chief Complaint  Patient presents with  . Hyperlipidemia      HPI:  Patient with history of hyperlipidemia and fatty liver.  Has tried 2 statins in past but cannot remember names.   Current NCEP Goals: LDL Goal < 100 HDL Goal >/= 45 Tg Goal < 401 Non-HDL Goal < 130  Secondary cause of hyperlipidemia present:  none Low fat diet followed?  No  Low carb diet followed?  No Exercise?  No   Assessment: Hyperlipidemia  Fatty Liver - worsened by hyperlipidemia Sub therapeutic anticoagulation  Recommendations: Start crestor 5mg  daily Take 5mg  warfarin to day, then take 2.5mg  daily (not adjusting today because crestor may increase INR) Recheck protime in 9 days. Recheck Lipid Panel:  4 weeks Other labs needed:  LFTs  Time spent counseling patient:  20 minutes   Henrene Pastor, PharmD, CPP

## 2013-07-09 ENCOUNTER — Ambulatory Visit (INDEPENDENT_AMBULATORY_CARE_PROVIDER_SITE_OTHER): Payer: Medicare Other | Admitting: Pharmacist

## 2013-07-09 DIAGNOSIS — J811 Chronic pulmonary edema: Secondary | ICD-10-CM

## 2013-07-09 LAB — POCT INR: INR: 2.4

## 2013-07-09 NOTE — Patient Instructions (Signed)
Anticoagulation Dose Instructions as of 07/09/2013     Kimberly Obrien Tue Wed Thu Fri Sat   New Dose 2.5 mg 2.5 mg 2.5 mg 2.5 mg 2.5 mg 2.5 mg 2.5 mg    Description       Continue current warfarin dose of 1/2 tablet daily.      INR was 2.4 today

## 2013-07-14 ENCOUNTER — Telehealth: Payer: Self-pay | Admitting: Pharmacist

## 2013-07-14 NOTE — Telephone Encounter (Signed)
Called patient with appt since computers were down when she came in for appt last week.

## 2013-07-16 NOTE — Telephone Encounter (Signed)
Patient called with appt time for next week

## 2013-07-24 ENCOUNTER — Ambulatory Visit (INDEPENDENT_AMBULATORY_CARE_PROVIDER_SITE_OTHER): Payer: Medicare Other | Admitting: Pharmacist

## 2013-07-24 DIAGNOSIS — E785 Hyperlipidemia, unspecified: Secondary | ICD-10-CM

## 2013-07-24 DIAGNOSIS — J811 Chronic pulmonary edema: Secondary | ICD-10-CM

## 2013-07-24 LAB — POCT INR: INR: 2.2

## 2013-07-24 NOTE — Progress Notes (Signed)
CHADSVASC score was 3 - moderate risk. Patient has history of PE x 2 (previsou smoker and was taking BCP's)  OK to hold warfarin prior to colonoscopy.  When restart after colonoscopy patient instructed to take 1 tablet for 3 days then restart 1/2 tablet daily.

## 2013-07-24 NOTE — Patient Instructions (Signed)
Anticoagulation Dose Instructions as of 07/24/2013     Kimberly Obrien Tue Wed Thu Fri Sat   New Dose 2.5 mg 2.5 mg 2.5 mg 2.5 mg 2.5 mg 2.5 mg 2.5 mg    Description       Continue current warfarin dose of 1/2 tablet daily.       INR was 2.2 today  OK to hold warfarin prior to colonoscopy - last dose of warfarin 07/26/13.    When restart after colonoscopy patient to take 1 tablet for 3 days then restart 1/2 tablet daily.

## 2013-07-31 ENCOUNTER — Telehealth: Payer: Self-pay | Admitting: Pharmacist

## 2013-07-31 MED ORDER — ROSUVASTATIN CALCIUM 5 MG PO TABS: 5.0000 mg | ORAL_TABLET | Freq: Every day | ORAL | Status: DC

## 2013-07-31 NOTE — Telephone Encounter (Signed)
rx sent to [facility] - patient notified

## 2013-08-01 ENCOUNTER — Other Ambulatory Visit: Payer: Self-pay | Admitting: Gastroenterology

## 2013-08-04 ENCOUNTER — Other Ambulatory Visit (INDEPENDENT_AMBULATORY_CARE_PROVIDER_SITE_OTHER): Payer: Medicare Other

## 2013-08-04 DIAGNOSIS — E785 Hyperlipidemia, unspecified: Secondary | ICD-10-CM

## 2013-08-04 LAB — LIPID PANEL
HDL: 38 mg/dL (ref 35–70)
LDL Cholesterol: 65 mg/dL

## 2013-08-04 NOTE — Progress Notes (Signed)
Patient came in for labs only.

## 2013-08-06 LAB — CMP14+EGFR
ALT: 63 IU/L — ABNORMAL HIGH (ref 0–32)
AST: 43 IU/L — ABNORMAL HIGH (ref 0–40)
Albumin/Globulin Ratio: 1.9 (ref 1.1–2.5)
Albumin: 4 g/dL (ref 3.5–5.5)
Alkaline Phosphatase: 78 IU/L (ref 39–117)
BUN/Creatinine Ratio: 9 (ref 9–23)
BUN: 8 mg/dL (ref 6–24)
CO2: 25 mmol/L (ref 18–29)
Calcium: 9.1 mg/dL (ref 8.7–10.2)
Chloride: 104 mmol/L (ref 97–108)
Creatinine, Ser: 0.86 mg/dL (ref 0.57–1.00)
GFR calc Af Amer: 90 mL/min/{1.73_m2} (ref >59)
GFR calc non Af Amer: 78 mL/min/{1.73_m2} (ref >59)
Globulin, Total: 2.1 g/dL (ref 1.5–4.5)
Glucose: 91 mg/dL (ref 65–99)
Potassium: 3.8 mmol/L (ref 3.5–5.2)
Sodium: 143 mmol/L (ref 134–144)
Total Bilirubin: 0.8 mg/dL (ref 0.0–1.2)
Total Protein: 6.1 g/dL (ref 6.0–8.5)

## 2013-08-06 LAB — NMR, LIPOPROFILE
Cholesterol: 131 mg/dL (ref <200)
HDL Cholesterol by NMR: 38 mg/dL — ABNORMAL LOW (ref >=40)
HDL Particle Number: 21.9 umol/L — ABNORMAL LOW (ref >=30.5)
LDL Particle Number: 1470 nmol/L — ABNORMAL HIGH (ref <1000)
LDL Size: 20.3 nm — ABNORMAL LOW (ref >20.5)
LDLC SERPL CALC-MCNC: 65 mg/dL (ref <100)
LP-IR Score: 39 (ref <=45)
Small LDL Particle Number: 891 nmol/L — ABNORMAL HIGH (ref <=527)
Triglycerides by NMR: 140 mg/dL (ref <150)

## 2013-08-11 ENCOUNTER — Telehealth: Payer: Self-pay | Admitting: Pharmacist

## 2013-08-11 NOTE — Telephone Encounter (Signed)
Called and given laboratory results.  Continue crestor 5 mg daily

## 2013-08-25 ENCOUNTER — Ambulatory Visit (INDEPENDENT_AMBULATORY_CARE_PROVIDER_SITE_OTHER): Payer: Medicare HMO | Admitting: Pharmacist

## 2013-08-25 DIAGNOSIS — J811 Chronic pulmonary edema: Secondary | ICD-10-CM

## 2013-08-25 LAB — POCT INR: INR: 2

## 2013-08-25 NOTE — Patient Instructions (Signed)
Anticoagulation Dose Instructions as of 08/25/2013     Kimberly Obrien Tue Wed Thu Fri Sat   New Dose 2.5 mg 2.5 mg 2.5 mg 2.5 mg 2.5 mg 2.5 mg 2.5 mg    Description       Continue current warfarin dose of 1/2 tablet daily.       INR was 2.0 today

## 2013-09-01 ENCOUNTER — Other Ambulatory Visit: Payer: Self-pay

## 2013-09-01 MED ORDER — WARFARIN SODIUM 5 MG PO TABS: 5.0000 mg | ORAL_TABLET | Freq: Every day | ORAL | Status: DC

## 2013-09-17 ENCOUNTER — Other Ambulatory Visit: Payer: Self-pay | Admitting: Family Medicine

## 2013-09-17 DIAGNOSIS — Z09 Encounter for follow-up examination after completed treatment for conditions other than malignant neoplasm: Secondary | ICD-10-CM

## 2013-09-17 DIAGNOSIS — N6002 Solitary cyst of left breast: Secondary | ICD-10-CM

## 2013-09-29 ENCOUNTER — Ambulatory Visit (INDEPENDENT_AMBULATORY_CARE_PROVIDER_SITE_OTHER): Payer: Medicare HMO | Admitting: Pharmacist

## 2013-09-29 DIAGNOSIS — Z86711 Personal history of pulmonary embolism: Secondary | ICD-10-CM

## 2013-09-29 DIAGNOSIS — J811 Chronic pulmonary edema: Secondary | ICD-10-CM

## 2013-09-29 LAB — POCT INR: INR: 1.7

## 2013-09-29 NOTE — Patient Instructions (Signed)
Anticoagulation Dose Instructions as of 09/29/2013     Kimberly Obrien Tue Wed Thu Fri Sat   New Dose 2.5 mg 2.5 mg 2.5 mg 2.5 mg 2.5 mg 2.5 mg 2.5 mg    Description       Take 1 tablet today, then continue current warfarin dose of 1/2 tablet daily.       INR was 1.7 today

## 2013-10-01 ENCOUNTER — Encounter (HOSPITAL_COMMUNITY): Payer: Medicare Other

## 2013-10-03 ENCOUNTER — Other Ambulatory Visit: Payer: Self-pay | Admitting: Pharmacist

## 2013-10-23 ENCOUNTER — Encounter: Payer: Self-pay | Admitting: Family Medicine

## 2013-10-23 ENCOUNTER — Ambulatory Visit (INDEPENDENT_AMBULATORY_CARE_PROVIDER_SITE_OTHER): Payer: Medicare HMO | Admitting: Family Medicine

## 2013-10-23 ENCOUNTER — Ambulatory Visit (INDEPENDENT_AMBULATORY_CARE_PROVIDER_SITE_OTHER): Payer: Medicare HMO

## 2013-10-23 VITALS — BP 138/70 | HR 92 | Temp 97.4°F | Wt 195.4 lb

## 2013-10-23 DIAGNOSIS — R109 Unspecified abdominal pain: Secondary | ICD-10-CM

## 2013-10-23 DIAGNOSIS — M542 Cervicalgia: Secondary | ICD-10-CM

## 2013-10-23 DIAGNOSIS — Z7901 Long term (current) use of anticoagulants: Secondary | ICD-10-CM

## 2013-10-23 DIAGNOSIS — R0789 Other chest pain: Secondary | ICD-10-CM

## 2013-10-23 DIAGNOSIS — Z5181 Encounter for therapeutic drug level monitoring: Secondary | ICD-10-CM

## 2013-10-23 DIAGNOSIS — R071 Chest pain on breathing: Secondary | ICD-10-CM

## 2013-10-23 DIAGNOSIS — J069 Acute upper respiratory infection, unspecified: Secondary | ICD-10-CM

## 2013-10-23 LAB — POCT CBC
Granulocyte percent: 78.5 %G (ref 37–80)
HCT, POC: 42.3 % (ref 37.7–47.9)
Hemoglobin: 14 g/dL (ref 12.2–16.2)
Lymph, poc: 2.1 (ref 0.6–3.4)
MCH, POC: 28.1 pg (ref 27–31.2)
MCHC: 33.2 g/dL (ref 31.8–35.4)
MCV: 84.7 fL (ref 80–97)
MPV: 8.6 fL (ref 0–99.8)
POC Granulocyte: 7.8 — AB (ref 2–6.9)
POC LYMPH PERCENT: 20.7 %L (ref 10–50)
Platelet Count, POC: 178 10*3/uL (ref 142–424)
RBC: 5 M/uL (ref 4.04–5.48)
RDW, POC: 13.5 %
WBC: 10 10*3/uL (ref 4.6–10.2)

## 2013-10-23 LAB — POCT URINALYSIS DIPSTICK
Bilirubin, UA: NEGATIVE
Glucose, UA: NEGATIVE
Ketones, UA: NEGATIVE
Nitrite, UA: NEGATIVE
Protein, UA: NEGATIVE
Spec Grav, UA: 1.01
Urobilinogen, UA: NEGATIVE
pH, UA: 6.5

## 2013-10-23 LAB — POCT UA - MICROSCOPIC ONLY
Bacteria, U Microscopic: NEGATIVE
Casts, Ur, LPF, POC: NEGATIVE
Crystals, Ur, HPF, POC: NEGATIVE
Mucus, UA: NEGATIVE
Yeast, UA: NEGATIVE

## 2013-10-23 MED ORDER — AMOXICILLIN 875 MG PO TABS: 875.0000 mg | ORAL_TABLET | Freq: Two times a day (BID) | ORAL | Status: DC

## 2013-10-23 NOTE — Progress Notes (Signed)
Subjective:    Patient ID: Kimberly Obrien, female    DOB: [birth year], 53 y.o.   MRN: [id number]  HPI This 53 y.o. female presents for evaluation of abdominal pain.  She states she has had this Abdominal pain for 2 days and she wanted to get it checked out.  She states the pain is in the Epigastric region and radiates up into her chest and is also along the right rib cage and right flank. She states when she pushes down on the area where she hurts in her abdomen the pain radiates Up into her chest.  She is also c/o cervicalgia headache.  She is c/o right thumb numbness. .   Review of Systems C/o chest wall pain, abdominal pain. Right hand and arm numbness   No chest pain, SOB, HA, dizziness, vision change, N/V, diarrhea, constipation, dysuria, urinary urgency or frequency, myalgias, arthralgias or rash.  Objective:   Physical Exam  Vital signs noted  Well developed well nourished female.  HEENT - Head atraumatic Normocephalic                Eyes - PERRLA, Conjuctiva - clear Sclera- Clear EOMI                Ears - EAC's Wnl TM's Wnl Gross Hearing  Respiratory - Lungs CTA bilateral Cardiac - RRR S1 and S2 without murmur GI - Abdomen soft TTP epigastric region and TTP right costal region  Extremities - No edema. Neuro - Grossly intact. MS - TTP cervical spine paraspinous muscles  KUB - Normal gas pattern CXR - No infiltrates Cervical spine xray - DDD  Prelimnary reading by Iverson Alamin     Assessment & Plan:  Abdominal pain, unspecified site - Plan: POCT CBC, POCT UA - Microscopic Only, POCT urinalysis dipstick, CMP14+EGFR, Lipase, DG Abd 1 View, Sedimentation rate, CANCELED: POCT SEDIMENTATION RATE  Encounter for monitoring coumadin therapy - Plan: Sedimentation rate  Chest wall pain - Plan: DG Chest 2 View, Sedimentation rate  Neck pain - Plan: DG Cervical Spine Complete  URI (upper respiratory infection) - Plan: amoxicillin (AMOXIL) 875 MG tablet po bid x  10 days Push po fluids, rest, tylenol and motrin otc prn as directed for fever, arthralgias, and myalgias.  Follow up prn if sx's continue or persist.  Lysbeth Penner FNP

## 2013-10-24 ENCOUNTER — Telehealth: Payer: Self-pay | Admitting: Family Medicine

## 2013-10-24 ENCOUNTER — Other Ambulatory Visit: Payer: Self-pay | Admitting: Family Medicine

## 2013-10-24 LAB — CMP14+EGFR
ALT: 64 IU/L — ABNORMAL HIGH (ref 0–32)
AST: 56 IU/L — ABNORMAL HIGH (ref 0–40)
Albumin/Globulin Ratio: 1.6 (ref 1.1–2.5)
Albumin: 3.9 g/dL (ref 3.5–5.5)
Alkaline Phosphatase: 84 IU/L (ref 39–117)
BUN/Creatinine Ratio: 9 (ref 9–23)
BUN: 6 mg/dL (ref 6–24)
CO2: 26 mmol/L (ref 18–29)
Calcium: 9 mg/dL (ref 8.7–10.2)
Chloride: 102 mmol/L (ref 97–108)
Creatinine, Ser: 0.7 mg/dL (ref 0.57–1.00)
GFR calc Af Amer: 114 mL/min/{1.73_m2} (ref >59)
GFR calc non Af Amer: 99 mL/min/{1.73_m2} (ref >59)
Globulin, Total: 2.4 g/dL (ref 1.5–4.5)
Glucose: 114 mg/dL — ABNORMAL HIGH (ref 65–99)
Potassium: 3.4 mmol/L — ABNORMAL LOW (ref 3.5–5.2)
Sodium: 142 mmol/L (ref 134–144)
Total Bilirubin: 0.6 mg/dL (ref 0.0–1.2)
Total Protein: 6.3 g/dL (ref 6.0–8.5)

## 2013-10-24 LAB — SEDIMENTATION RATE: Sed Rate: 4 mm/hr (ref 0–40)

## 2013-10-24 LAB — LIPASE: Lipase: 41 U/L (ref 0–59)

## 2013-10-24 MED ORDER — OMEPRAZOLE 20 MG PO CPDR: 20.0000 mg | DELAYED_RELEASE_CAPSULE | Freq: Every day | ORAL | Status: DC

## 2013-10-28 ENCOUNTER — Encounter: Payer: Self-pay | Admitting: Family Medicine

## 2013-10-28 ENCOUNTER — Ambulatory Visit (INDEPENDENT_AMBULATORY_CARE_PROVIDER_SITE_OTHER): Payer: Medicare HMO | Admitting: Family Medicine

## 2013-10-28 VITALS — BP 115/72 | HR 79 | Temp 98.8°F | Ht 66.0 in | Wt 194.0 lb

## 2013-10-28 DIAGNOSIS — R945 Abnormal results of liver function studies: Secondary | ICD-10-CM

## 2013-10-28 DIAGNOSIS — K760 Fatty (change of) liver, not elsewhere classified: Secondary | ICD-10-CM

## 2013-10-28 DIAGNOSIS — R7989 Other specified abnormal findings of blood chemistry: Secondary | ICD-10-CM

## 2013-10-28 DIAGNOSIS — K7689 Other specified diseases of liver: Secondary | ICD-10-CM

## 2013-10-28 DIAGNOSIS — R1011 Right upper quadrant pain: Secondary | ICD-10-CM

## 2013-10-28 MED ORDER — GABAPENTIN 100 MG PO CAPS: 100.0000 mg | ORAL_CAPSULE | Freq: Three times a day (TID) | ORAL | Status: DC

## 2013-10-28 NOTE — Progress Notes (Signed)
   Subjective:    Patient ID: Kimberly Obrien, female    DOB: 1961/08/06, 53 y.o.   MRN: [id number]  HPI This 53 y.o. female presents for evaluation of abdominal pain in RUQ of abdomen. She has hx of PE and is on coumadin treatment.  She states the sx's she had with The RUQ abdomen pain is similar to the discomfort she had when she found out She had PE.  She has had cholycystectomy.  She has hx of elevated transaminases and Fatty liver disease. Her mother died from liver disease.  She does not drink ETOH.   Review of Systems C/o abdominal pain   No chest pain, SOB, HA, dizziness, vision change, N/V, diarrhea, constipation, dysuria, urinary urgency or frequency, myalgias, arthralgias or rash.  Objective:   Physical Exam Vital signs noted  Well developed well nourished  female.  HEENT - Head atraumatic Normocephalic                Eyes - PERRLA, Conjuctiva - clear Sclera- Clear EOMI                Ears - EAC's Wnl TM's Wnl Gross Hearing WNL                Nose - Nares patent                 Throat - oropharanx wnl Respiratory - Lungs CTA bilateral Cardiac - RRR S1 and S2 without murmur GI - Abdomen soft Nontender and bowel sounds active x 4 Extremities - No edema. Neuro - Grossly intact.       Assessment & Plan:  Abdominal pain, right upper quadrant - Plan: Ambulatory referral to Gastroenterology, gabapentin (NEURONTIN) 100 MG capsule.  The pain could be from her PE's and she may have some neuropathy from this.  Recommend referral to GI to r/o cirrhosis r/o GI etiology for abdominal pain.  Fatty liver - Plan: Ambulatory referral to Gastroenterology  Elevated LFTs - Plan: Ambulatory referral to Gastroenterology  Follow up in one month  Lysbeth Penner FNP

## 2013-11-25 ENCOUNTER — Ambulatory Visit: Payer: Medicare HMO | Admitting: Family Medicine

## 2013-12-04 ENCOUNTER — Ambulatory Visit (INDEPENDENT_AMBULATORY_CARE_PROVIDER_SITE_OTHER): Payer: Medicare PPO | Admitting: Cardiovascular Disease

## 2013-12-04 ENCOUNTER — Encounter: Payer: Self-pay | Admitting: Cardiovascular Disease

## 2013-12-04 VITALS — BP 119/70 | HR 91 | Ht 66.0 in | Wt 196.0 lb

## 2013-12-04 DIAGNOSIS — Z136 Encounter for screening for cardiovascular disorders: Secondary | ICD-10-CM

## 2013-12-04 DIAGNOSIS — R000 Tachycardia, unspecified: Secondary | ICD-10-CM | POA: Insufficient documentation

## 2013-12-04 DIAGNOSIS — R002 Palpitations: Secondary | ICD-10-CM

## 2013-12-04 DIAGNOSIS — R0989 Other specified symptoms and signs involving the circulatory and respiratory systems: Secondary | ICD-10-CM

## 2013-12-04 DIAGNOSIS — R0600 Dyspnea, unspecified: Secondary | ICD-10-CM

## 2013-12-04 DIAGNOSIS — R0609 Other forms of dyspnea: Secondary | ICD-10-CM

## 2013-12-04 DIAGNOSIS — I2699 Other pulmonary embolism without acute cor pulmonale: Secondary | ICD-10-CM

## 2013-12-04 MED ORDER — DILTIAZEM HCL 30 MG PO TABS: 30.0000 mg | ORAL_TABLET | Freq: Two times a day (BID) | ORAL | Status: DC

## 2013-12-04 NOTE — Patient Instructions (Signed)
   Begin Diltiazem 35m twice a day - new sent to pharm Continue all other medications.   Your physician has requested that you have an echocardiogram. Echocardiography is a painless test that uses sound waves to create images of your heart. It provides your doctor with information about the size and shape of your heart and how well your heart's chambers and valves are working. This procedure takes approximately one hour. There are no restrictions for this procedure. Follow up next week

## 2013-12-04 NOTE — Progress Notes (Signed)
Patient ID: Kimberly Obrien, female   DOB: 06/17/1961, 53 y.o.   MRN: [id number]       CARDIOLOGY CONSULT NOTE  Patient ID: Kimberly Obrien MRN: [id number] DOB/AGE: 1961-05-04 52 y.o.  Admit date: (Not on file) Primary Physician Lysbeth Penner, FNP  Reason for Consultation: tachycardia, shortness of breath  HPI: The patient is a 53 year old woman with a history of pulmonary embolism for which he takes warfarin. She was seeing Dr. Glenna Fellows earlier today and was complaining of palpitations radiating into her neck, and associated with shortness of breath after short walks. He had her walk up and down the hall 2 or 3 times and her resting heart rate had been in the mid 80 beat per minute range. After walking, her heart rate reportedly went up to 150 beats per minute and the patient complained of some shortness of breath. A 12-lead ECG performed in the office today shows normal sinus rhythm, heart rate 91 beats per minutes, with no acute diagnostic ST segment or T wave abnormalities. Intervals are also within normal limits. Blood pressure is 119/70.  She tells me she has had sensations of palpitations for the past one year. She will become dyspneic with minimal exertion, even walking from her living room to the kitchen. It is the worst when she has to walk her dog. He will also occur if she has to bend over. She denies chest pain, but occasionally experiences a pinprick sensation underneath her left breast and left axilla. She denies leg swelling, orthopnea, lightheadedness, dizziness and syncope.  I had her walk for a brief period of time and within 20-30 seconds, her heart rate elevated to 128 beats per minute.  She experienced her first large pulmonary embolism in the late 1990s, and then sustained smaller pulmonary emboli in 2008 or 2009.   Allergies  Allergen Reactions  . Aciphex [Rabeprazole Sodium]   . Claritin [Loratadine]   . Diflucan [Fluconazole]   . Doxycycline   .  Ibuprofen   . Keflet [Cephalexin]   . Macrobid [Nitrofurantoin Macrocrystal]   . Nsaids   . Septra [Sulfamethoxazole-Trimethoprim]     Current Outpatient Prescriptions  Medication Sig Dispense Refill  . budesonide-formoterol (SYMBICORT) 160-4.5 MCG/ACT inhaler Inhale 2 puffs into the lungs as needed.      . cholecalciferol (VITAMIN D) 1000 UNITS tablet Take 1,000 Units by mouth daily. 4 week treatment.      . warfarin (COUMADIN) 5 MG tablet Take 2.5 mg by mouth daily.       No current facility-administered medications for this visit.    Past Medical History  Diagnosis Date  . Anticoagulation monitoring by pharmacist   . PE (pulmonary embolism)   . Arthritis   . COPD (chronic obstructive pulmonary disease)     Past Surgical History  Procedure Laterality Date  . Tubal ligation    . Wrist surgery Right     Had surgery twice to shave bone for blood circulation improvement.  . Cholecystectomy    . Spine surgery      L5 S1 fusion  . Abdominal hysterectomy    . Eye surgery      Cataracts    History   Social History  . Marital Status: Married    Spouse Name: N/A    Number of Children: N/A  . Years of Education: N/A   Occupational History  . Not on file.   Social History Main Topics  . Smoking status: Former Smoker  Quit date: 04/21/2012  . Smokeless tobacco: Not on file  . Alcohol Use: No  . Drug Use: No  . Sexual Activity: Not on file   Other Topics Concern  . Not on file   Social History Narrative  . No narrative on file     No family history of premature CAD in 1st degree relatives.  Prior to Admission medications   Medication Sig Start Date End Date Taking? Authorizing Provider  budesonide-formoterol (SYMBICORT) 160-4.5 MCG/ACT inhaler Inhale 2 puffs into the lungs as needed. 12/10/12  Yes Vernie Shanks, MD  cholecalciferol (VITAMIN D) 1000 UNITS tablet Take 1,000 Units by mouth daily. 4 week treatment.   Yes Historical Provider, MD  warfarin  (COUMADIN) 5 MG tablet Take 2.5 mg by mouth daily. 09/01/13  Yes Vernie Shanks, MD     Review of systems complete and found to be negative unless listed above in HPI     Physical exam Blood pressure 119/70, height 5' 6"  (1.676 m), weight 196 lb (88.905 kg). General: NAD Neck: No JVD, no thyromegaly or thyroid nodule.  Lungs: Clear to auscultation bilaterally with normal respiratory effort. CV: Nondisplaced PMI.  Heart regular S1/S2, no S3/S4, no murmur.  No peripheral edema.  No carotid bruit.  Normal pedal pulses.  Abdomen: Soft, nontender, no hepatosplenomegaly, no distention.  Skin: Intact without lesions or rashes.  Neurologic: Alert and oriented x 3.  Psych: Normal affect. Extremities: No clubbing or cyanosis.  HEENT: Normal.   Labs:   Lab Results  Component Value Date   WBC 10.0 10/23/2013   HGB 14.0 10/23/2013   HCT 42.3 10/23/2013   MCV 84.7 10/23/2013   PLT 260 04/24/2007   No results found for this basename: NA, K, CL, CO2, BUN, CREATININE, CALCIUM, LABALBU, PROT, BILITOT, ALKPHOS, ALT, AST, GLUCOSE,  in the last 168 hours Lab Results  Component Value Date   CKTOTAL 65 02/04/2013    Lab Results  Component Value Date   CHOL 131 08/04/2013   No results found for this basename: HDL   Lab Results  Component Value Date   LDLCALC 141* 02/04/2013   Lab Results  Component Value Date   TRIG 136 02/04/2013   No results found for this basename: CHOLHDL   No results found for this basename: LDLDIRECT         Studies: No results found.  ASSESSMENT AND PLAN:  1. Tachycardia/palpitations: Again, after having her walk for 20-30 seconds, her heart rate elevated quickly to 128 beats per minute. It was regular and consistent with a sinus tachycardia. This may very well be due to cardiovascular deconditioning, and she has not had any exercise after undergoing four spine surgeries. I will obtain an echocardiogram to make certain she has not developed a tachycardia-mediated  cardiomyopathy. I will also evaluate right-sided function given her history of pulmonary embolism. In order to control her symptoms, I will start low-dose short-acting diltiazem 30 mg twice daily.  Dispo: f/u next week.   Signed: Kate Sable, M.D., F.A.C.C.  12/04/2013, 3:06 PM

## 2013-12-10 ENCOUNTER — Encounter: Payer: Self-pay | Admitting: Cardiovascular Disease

## 2013-12-10 ENCOUNTER — Ambulatory Visit (INDEPENDENT_AMBULATORY_CARE_PROVIDER_SITE_OTHER): Payer: Medicare PPO | Admitting: Cardiovascular Disease

## 2013-12-10 ENCOUNTER — Other Ambulatory Visit: Payer: Self-pay

## 2013-12-10 ENCOUNTER — Other Ambulatory Visit (INDEPENDENT_AMBULATORY_CARE_PROVIDER_SITE_OTHER): Payer: Medicare PPO

## 2013-12-10 VITALS — BP 117/73 | HR 79 | Ht 66.0 in | Wt 196.0 lb

## 2013-12-10 DIAGNOSIS — R0989 Other specified symptoms and signs involving the circulatory and respiratory systems: Secondary | ICD-10-CM

## 2013-12-10 DIAGNOSIS — R5383 Other fatigue: Secondary | ICD-10-CM

## 2013-12-10 DIAGNOSIS — R0600 Dyspnea, unspecified: Secondary | ICD-10-CM

## 2013-12-10 DIAGNOSIS — I059 Rheumatic mitral valve disease, unspecified: Secondary | ICD-10-CM

## 2013-12-10 DIAGNOSIS — R000 Tachycardia, unspecified: Secondary | ICD-10-CM

## 2013-12-10 DIAGNOSIS — I2699 Other pulmonary embolism without acute cor pulmonale: Secondary | ICD-10-CM

## 2013-12-10 DIAGNOSIS — R5381 Other malaise: Secondary | ICD-10-CM

## 2013-12-10 DIAGNOSIS — R0609 Other forms of dyspnea: Secondary | ICD-10-CM

## 2013-12-10 DIAGNOSIS — Z136 Encounter for screening for cardiovascular disorders: Secondary | ICD-10-CM

## 2013-12-10 DIAGNOSIS — R002 Palpitations: Secondary | ICD-10-CM

## 2013-12-10 MED ORDER — DILTIAZEM HCL 60 MG PO TABS: 60.0000 mg | ORAL_TABLET | Freq: Two times a day (BID) | ORAL | Status: DC

## 2013-12-10 NOTE — Progress Notes (Signed)
Patient ID: Kimberly Obrien, female   DOB: 1961/02/15, 53 y.o.   MRN: [id number]      SUBJECTIVE: The patient is here to followup for tachycardia and palpitations. Echocardiography today demonstrated normal left ventricular systolic function, EF 95-09%, normal diastolic function, and some suggestion for mild right ventricular dilatation, although there was suboptimal imaging of right-sided structures. Ventricular septal motion was suggestive of right ventricular pressure and volume overload, but again, this was suboptimally visualized. No tricuspid regurgitation was visualized.  Since starting diltiazem 30 mg twice daily, she does not feel that her symptoms have improved. She was out gardening with her husband yesterday, and she tired out very quickly. She does say that she has been fatigued over the past year and this is both with rest and exertion. She denies exertional chest pain. She says she does not sleep much at night.    Allergies  Allergen Reactions  . Aciphex [Rabeprazole Sodium]   . Claritin [Loratadine]   . Diflucan [Fluconazole]   . Doxycycline   . Ibuprofen   . Keflet [Cephalexin]   . Macrobid [Nitrofurantoin Macrocrystal]   . Nsaids   . Septra [Sulfamethoxazole-Trimethoprim]     Current Outpatient Prescriptions  Medication Sig Dispense Refill  . budesonide-formoterol (SYMBICORT) 160-4.5 MCG/ACT inhaler Inhale 2 puffs into the lungs as needed.      . cholecalciferol (VITAMIN D) 1000 UNITS tablet Take 1,000 Units by mouth daily. 4 week treatment.      Marland Kitchen diltiazem (CARDIZEM) 30 MG tablet Take 1 tablet (30 mg total) by mouth 2 (two) times daily.  60 tablet  6  . warfarin (COUMADIN) 5 MG tablet Take 2.5 mg by mouth daily.       No current facility-administered medications for this visit.    Past Medical History  Diagnosis Date  . Anticoagulation monitoring by pharmacist   . PE (pulmonary embolism)   . Arthritis   . COPD (chronic obstructive pulmonary disease)      Past Surgical History  Procedure Laterality Date  . Tubal ligation    . Wrist surgery Right     Had surgery twice to shave bone for blood circulation improvement.  . Cholecystectomy    . Spine surgery      L5 S1 fusion  . Abdominal hysterectomy    . Eye surgery      Cataracts    History   Social History  . Marital Status: Married    Spouse Name: N/A    Number of Children: N/A  . Years of Education: N/A   Occupational History  . Not on file.   Social History Main Topics  . Smoking status: Former Smoker    Quit date: 04/21/2012  . Smokeless tobacco: Not on file  . Alcohol Use: No  . Drug Use: No  . Sexual Activity: Not on file   Other Topics Concern  . Not on file   Social History Narrative  . No narrative on file     Filed Vitals:   12/10/13 1507  BP: 117/73  Pulse: 79  Height: 5' 6"  (1.676 m)  Weight: 196 lb (88.905 kg)  SpO2: 93%    PHYSICAL EXAM General: NAD Neck: No JVD, no thyromegaly. Lungs: Clear to auscultation bilaterally with normal respiratory effort. CV: Nondisplaced PMI.  Regular rate and rhythm, normal S1/S2, no S3/S4, no murmur. No pretibial or periankle edema.  No carotid bruit.  Normal pedal pulses.  Abdomen: Soft, nontender, no hepatosplenomegaly, no distention.  Neurologic: Alert and oriented  x 3.  Psych: Normal affect. Extremities: No clubbing or cyanosis.   ECG: reviewed and available in electronic records.      ASSESSMENT AND PLAN: 1. Tachycardia/palpitations: As symptoms persist,  I will increase short-acting diltiazem to 60 mg twice daily. We'll have to monitor blood pressure to avoid hypotension. 2. Fatigue: Given that her symptoms have been ongoing for a year, I would consider testing in the future with regards to a sleep study and possibly a stress test.  Dispo: f/u 1 month.  Kate Sable, M.D., F.A.C.C.

## 2013-12-10 NOTE — Patient Instructions (Signed)
   Increase Diltiazem to 64m twice a day - new sent to pharm Continue all other medications.   Follow up in  1 month

## 2013-12-15 ENCOUNTER — Ambulatory Visit (INDEPENDENT_AMBULATORY_CARE_PROVIDER_SITE_OTHER): Payer: Medicare HMO | Admitting: Family Medicine

## 2013-12-15 ENCOUNTER — Encounter: Payer: Self-pay | Admitting: Family Medicine

## 2013-12-15 VITALS — BP 135/73 | HR 85 | Temp 97.1°F | Ht 66.0 in | Wt 195.2 lb

## 2013-12-15 DIAGNOSIS — I4891 Unspecified atrial fibrillation: Secondary | ICD-10-CM

## 2013-12-15 DIAGNOSIS — J441 Chronic obstructive pulmonary disease with (acute) exacerbation: Secondary | ICD-10-CM

## 2013-12-15 DIAGNOSIS — J811 Chronic pulmonary edema: Secondary | ICD-10-CM

## 2013-12-15 LAB — POCT INR: INR: 2.5

## 2013-12-15 MED ORDER — ALBUTEROL SULFATE HFA 108 (90 BASE) MCG/ACT IN AERS: 2.0000 | INHALATION_SPRAY | Freq: Four times a day (QID) | RESPIRATORY_TRACT | Status: DC | PRN

## 2013-12-15 MED ORDER — BUDESONIDE-FORMOTEROL FUMARATE 160-4.5 MCG/ACT IN AERO: 2.0000 | INHALATION_SPRAY | RESPIRATORY_TRACT | Status: DC | PRN

## 2013-12-15 NOTE — Patient Instructions (Signed)
Anticoagulation Dose Instructions as of 12/15/2013     Dorene Grebe Tue Wed Thu Fri Sat   New Dose 2.5 mg 2.5 mg 2.5 mg 2.5 mg 2.5 mg 2.5 mg 2.5 mg    Description       Take 1 tablet today, then continue current warfarin dose of 1/2 tablet daily.

## 2013-12-15 NOTE — Progress Notes (Signed)
   Subjective:    Patient ID: Kimberly Obrien, female    DOB: 06/29/1961, 53 y.o.   MRN: [id number]  HPI This 53 y.o. female presents for evaluation of her palpitations and thumping in her head. She was tx'd for tachycardia by her cardiologist and has recently been tx'd with diltiazem and she  Is due to follow up.  She is out of her symbicort.  She states she notices she feels better with her breathing when she is using her symbicort.   Review of Systems C/o palpitations No chest pain, SOB, HA, dizziness, vision change, N/V, diarrhea, constipation, dysuria, urinary urgency or frequency, myalgias, arthralgias or rash.     Objective:   Physical Exam  Vital signs noted  Well developed well nourished female.  HEENT - Head atraumatic Normocephalic                Eyes - PERRLA, Conjuctiva - clear Sclera- Clear EOMI                Ears - EAC's Wnl TM's Wnl Gross Hearing WNL                Nose - Nares patent                 Throat - oropharanx wnl Respiratory - Lungs CTA bilateral Cardiac - RRR S1 and S2 without murmur GI - Abdomen soft Nontender and bowel sounds active x 4       Assessment & Plan:  Pulmonary embolism - Continue current anticoagulation regimen and follow up in one month with Kimberly Obrien Pharm D.  COPD exacerbation - Plan: budesonide-formoterol (SYMBICORT) 160-4.5 MCG/ACT inhaler, albuterol (PROVENTIL HFA;VENTOLIN HFA) 108 (90 BASE) MCG/ACT inhaler  Tachycardia - Continue to follow up with Cardiology.  Follow up in 3-4 months and prn.  Lysbeth Penner FNP

## 2013-12-16 ENCOUNTER — Other Ambulatory Visit: Payer: Self-pay | Admitting: Family Medicine

## 2014-01-02 ENCOUNTER — Ambulatory Visit (INDEPENDENT_AMBULATORY_CARE_PROVIDER_SITE_OTHER): Payer: Medicare PPO | Admitting: Cardiovascular Disease

## 2014-01-02 ENCOUNTER — Encounter: Payer: Self-pay | Admitting: *Deleted

## 2014-01-02 ENCOUNTER — Encounter: Payer: Self-pay | Admitting: Cardiovascular Disease

## 2014-01-02 VITALS — BP 107/69 | HR 80 | Ht 66.0 in | Wt 198.0 lb

## 2014-01-02 DIAGNOSIS — R5383 Other fatigue: Secondary | ICD-10-CM

## 2014-01-02 DIAGNOSIS — R002 Palpitations: Secondary | ICD-10-CM

## 2014-01-02 DIAGNOSIS — I447 Left bundle-branch block, unspecified: Secondary | ICD-10-CM

## 2014-01-02 DIAGNOSIS — R0602 Shortness of breath: Secondary | ICD-10-CM

## 2014-01-02 DIAGNOSIS — R079 Chest pain, unspecified: Secondary | ICD-10-CM

## 2014-01-02 DIAGNOSIS — R011 Cardiac murmur, unspecified: Secondary | ICD-10-CM

## 2014-01-02 DIAGNOSIS — R5381 Other malaise: Secondary | ICD-10-CM

## 2014-01-02 DIAGNOSIS — R0989 Other specified symptoms and signs involving the circulatory and respiratory systems: Secondary | ICD-10-CM

## 2014-01-02 MED ORDER — DILTIAZEM HCL 60 MG PO TABS: 60.0000 mg | ORAL_TABLET | Freq: Three times a day (TID) | ORAL | Status: DC

## 2014-01-02 NOTE — Progress Notes (Signed)
Patient ID: Kimberly Obrien, female   DOB: 1960-12-13, 53 y.o.   MRN: [id number]      SUBJECTIVE: The patient is here to followup for tachycardia and palpitations. Previous echocardiogram demonstrated normal left ventricular systolic function, EF 62-26%, normal diastolic function, and some suggestion for mild right ventricular dilatation, although there was suboptimal imaging of right-sided structures. Ventricular septal motion was suggestive of right ventricular pressure and volume overload, but again, this was suboptimally visualized. No tricuspid regurgitation was visualized.  While she continues to experience palpitations, they're somewhat improved. They are worse with exertion. She denies chest pain. She continues to complain of significant fatigue which has been progressive over the past few years.       Allergies  Allergen Reactions  . Aciphex [Rabeprazole Sodium]   . Claritin [Loratadine]   . Diflucan [Fluconazole]   . Doxycycline   . Ibuprofen   . Keflet [Cephalexin]   . Macrobid [Nitrofurantoin Macrocrystal]   . Nsaids   . Septra [Sulfamethoxazole-Trimethoprim]     Current Outpatient Prescriptions  Medication Sig Dispense Refill  . albuterol (PROVENTIL HFA;VENTOLIN HFA) 108 (90 BASE) MCG/ACT inhaler Inhale 2 puffs into the lungs every 6 (six) hours as needed for wheezing or shortness of breath.  1 Inhaler  0  . budesonide-formoterol (SYMBICORT) 160-4.5 MCG/ACT inhaler Inhale 2 puffs into the lungs as needed.  1 Inhaler  11  . cholecalciferol (VITAMIN D) 1000 UNITS tablet Take 1,000 Units by mouth daily. 4 week treatment.      Marland Kitchen diltiazem (CARDIZEM) 60 MG tablet Take 1 tablet (60 mg total) by mouth 2 (two) times daily.  60 tablet  6  . warfarin (COUMADIN) 5 MG tablet Take 2.5 mg by mouth daily.       No current facility-administered medications for this visit.    Past Medical History  Diagnosis Date  . Anticoagulation monitoring by pharmacist   . PE (pulmonary  embolism)   . Arthritis   . COPD (chronic obstructive pulmonary disease)     Past Surgical History  Procedure Laterality Date  . Tubal ligation    . Wrist surgery Right     Had surgery twice to shave bone for blood circulation improvement.  . Cholecystectomy    . Spine surgery      L5 S1 fusion  . Abdominal hysterectomy    . Eye surgery      Cataracts    History   Social History  . Marital Status: Married    Spouse Name: N/A    Number of Children: N/A  . Years of Education: N/A   Occupational History  . Not on file.   Social History Main Topics  . Smoking status: Former Smoker    Quit date: 04/21/2012  . Smokeless tobacco: Never Used  . Alcohol Use: No  . Drug Use: No  . Sexual Activity: Not on file   Other Topics Concern  . Not on file   Social History Narrative  . No narrative on file     Filed Vitals:   01/02/14 1520  BP: 107/69  Pulse: 80  Height: 5' 6"  (1.676 m)  Weight: 198 lb (89.812 kg)    PHYSICAL EXAM General: NAD Neck: No JVD, no thyromegaly. Lungs: Clear to auscultation bilaterally with normal respiratory effort. CV: Nondisplaced PMI.  Regular rate and rhythm, normal S1/S2, no S3/S4, no murmur. No pretibial or periankle edema.  No carotid bruit.  Normal pedal pulses.  Abdomen: Soft, nontender, no hepatosplenomegaly, no distention.  Neurologic: Alert and oriented x 3.  Psych: Normal affect. Extremities: No clubbing or cyanosis.   ECG: reviewed and available in electronic records.      ASSESSMENT AND PLAN: 1. Tachycardia/palpitations: As symptoms persist, I will increase short-acting diltiazem to 60 mg three times daily. Will have to monitor blood pressure to avoid hypotension.  2. Fatigue and SOB: Given that her symptoms have been ongoing for a year, I will proceed with a Lexiscan Cardiolite stress test in order to evaluate for occult ischemic heart disease. If this is unrevealing, I would consider a sleep study.  Dispo: f/u 1  month.   Kate Sable, M.D., F.A.C.C.

## 2014-01-02 NOTE — Patient Instructions (Signed)
   Increase Diltiazem 60mg  to three times per day - new sent to pharm Continue all other medications.   Your physician has requested that you have a lexiscan myoview. For further information please visit [email or web address]. Please follow instruction sheet, as given. Office will contact with results via phone or letter.   Follow up in  1 month

## 2014-01-06 ENCOUNTER — Telehealth: Payer: Self-pay | Admitting: Cardiovascular Disease

## 2014-01-06 NOTE — Telephone Encounter (Signed)
Patient states that for the past two days she feels like her heart is racing. States that she has had a headache for the past two days. Patient states that she feels like she is retaining fluid. States that she is experiencing some shortness of breath at times.  Please call # [phone]

## 2014-01-07 ENCOUNTER — Telehealth: Payer: Self-pay | Admitting: Family Medicine

## 2014-01-07 NOTE — Telephone Encounter (Signed)
Appt given for tomorrow afternoon per patients request

## 2014-01-07 NOTE — Telephone Encounter (Signed)
She may need to wear a Holter monitor but would recommend she be evaluated. I would have her come to office on 5/21 for nurse to check BP and HR initially, and can proceed from there.

## 2014-01-08 ENCOUNTER — Ambulatory Visit (INDEPENDENT_AMBULATORY_CARE_PROVIDER_SITE_OTHER): Payer: Medicare HMO

## 2014-01-08 ENCOUNTER — Ambulatory Visit (INDEPENDENT_AMBULATORY_CARE_PROVIDER_SITE_OTHER): Payer: Medicare HMO | Admitting: Family Medicine

## 2014-01-08 ENCOUNTER — Encounter: Payer: Self-pay | Admitting: Family Medicine

## 2014-01-08 VITALS — BP 105/66 | HR 72 | Temp 98.3°F | Ht 66.0 in | Wt 199.0 lb

## 2014-01-08 DIAGNOSIS — S93409A Sprain of unspecified ligament of unspecified ankle, initial encounter: Secondary | ICD-10-CM

## 2014-01-08 DIAGNOSIS — M79609 Pain in unspecified limb: Secondary | ICD-10-CM

## 2014-01-08 DIAGNOSIS — R52 Pain, unspecified: Secondary | ICD-10-CM

## 2014-01-08 DIAGNOSIS — S93401A Sprain of unspecified ligament of right ankle, initial encounter: Secondary | ICD-10-CM

## 2014-01-08 MED ORDER — TRAMADOL HCL 50 MG PO TABS: 50.0000 mg | ORAL_TABLET | Freq: Three times a day (TID) | ORAL | Status: DC | PRN

## 2014-01-08 NOTE — Progress Notes (Signed)
   Subjective:    Patient ID: Kimberly Obrien, female    DOB: 09-18-1960, 53 y.o.   MRN: [id number]  HPI  This 53 y.o. female presents for evaluation of right ankle pain after rolling her ankle yesterday.  She Is walking around ok without difficulties.  Review of Systems No chest pain, SOB, HA, dizziness, vision change, N/V, diarrhea, constipation, dysuria, urinary urgency or frequency, myalgias, arthralgias or rash.     Objective:   Physical Exam Right ankle with lateral swelling and TTP, no crepitus  Xray right foot - no fracture       Assessment & Plan:  Pain - Plan: DG Ankle Complete Right, traMADol (ULTRAM) 50 MG tablet  Right ankle sprain - Plan: traMADol (ULTRAM) 50 MG tablet  Lysbeth Penner FNP

## 2014-01-08 NOTE — Telephone Encounter (Signed)
Patient said her symptoms have all improved. Patient is seeing Dr. Carloyn Manner for spur in and bulging disc in her back and thinks her symptoms could have been coming from that. Patient informed that she didn't need nurse visit this time and to contact our office if her symptoms reoccur.

## 2014-01-09 ENCOUNTER — Encounter (HOSPITAL_COMMUNITY): Payer: Self-pay

## 2014-01-09 ENCOUNTER — Encounter (HOSPITAL_COMMUNITY)
Admission: RE | Admit: 2014-01-09 | Discharge: 2014-01-09 | Disposition: A | Discharge status: Home / Self Care | Payer: Medicare HMO | Source: Ambulatory Visit | Attending: Cardiovascular Disease | Admitting: Cardiovascular Disease

## 2014-01-09 ENCOUNTER — Ambulatory Visit (HOSPITAL_COMMUNITY)
Admission: RE | Admit: 2014-01-09 | Discharge: 2014-01-09 | Disposition: A | Discharge status: Home / Self Care | Payer: Medicare HMO | Source: Ambulatory Visit | Attending: Cardiovascular Disease | Admitting: Cardiovascular Disease

## 2014-01-09 DIAGNOSIS — R000 Tachycardia, unspecified: Secondary | ICD-10-CM | POA: Insufficient documentation

## 2014-01-09 DIAGNOSIS — R002 Palpitations: Secondary | ICD-10-CM

## 2014-01-09 DIAGNOSIS — R5381 Other malaise: Secondary | ICD-10-CM | POA: Insufficient documentation

## 2014-01-09 DIAGNOSIS — R0602 Shortness of breath: Secondary | ICD-10-CM | POA: Insufficient documentation

## 2014-01-09 DIAGNOSIS — R5383 Other fatigue: Secondary | ICD-10-CM

## 2014-01-09 DIAGNOSIS — R0989 Other specified symptoms and signs involving the circulatory and respiratory systems: Secondary | ICD-10-CM | POA: Insufficient documentation

## 2014-01-09 DIAGNOSIS — R0609 Other forms of dyspnea: Secondary | ICD-10-CM | POA: Insufficient documentation

## 2014-01-09 HISTORY — DX: Unspecified asthma, uncomplicated: J45.909

## 2014-01-09 MED ORDER — TECHNETIUM TC 99M SESTAMIBI - CARDIOLITE
10.0000 | Freq: Once | INTRAVENOUS | Status: AC | PRN
  Administered 2014-01-09: 10 via INTRAVENOUS

## 2014-01-09 MED ORDER — SODIUM CHLORIDE 0.9 % IJ SOLN
INTRAMUSCULAR | Status: AC
  Administered 2014-01-09: 10 mL via INTRAVENOUS
  Filled 2014-01-09: qty 10

## 2014-01-09 MED ORDER — TECHNETIUM TC 99M SESTAMIBI GENERIC - CARDIOLITE
30.0000 | Freq: Once | INTRAVENOUS | Status: AC | PRN
  Administered 2014-01-09: 30 via INTRAVENOUS

## 2014-01-09 MED ORDER — REGADENOSON 0.4 MG/5ML IV SOLN
INTRAVENOUS | Status: AC
  Administered 2014-01-09: 0.4 mg via INTRAVENOUS
  Filled 2014-01-09: qty 5

## 2014-01-09 NOTE — Progress Notes (Signed)
Stress Lab Nurses Notes - [facility]  ELIZAH LYDON 01/09/2014 Reason for doing test: Arrhythmia/Palpitations and Dyspnea Type of test: Leane Call Nurse performing test: Carvel Getting, RN Nuclear Medicine Tech: Redmond Baseman Echo Tech: Not Applicable MD performing test: Woodroe Chen NP Family MD: Moore/ [place] Test explained and consent signed: yes IV started: 22g jelco, Saline lock flushed, No redness or edema and Saline lock started in radiology Symptoms: funny feeling in stomach and SOB Treatment/Intervention: None Reason test stopped: protocol completed After recovery IV was: Discontinued via X-ray tech, No redness or edema and Saline Lock flushed Patient to return to [place]. Med at : Patient discharged: Home Patient's Condition upon discharge was: stable Comments: Lexiscan test preformed. Exercise HR109 and BP was 126/58 and Recovery HR 71 and Recovery BP 108/58. Symptoms resolved in recovery. Norlene Duel

## 2014-01-13 ENCOUNTER — Other Ambulatory Visit: Payer: Self-pay | Admitting: Family Medicine

## 2014-01-13 DIAGNOSIS — S82899A Other fracture of unspecified lower leg, initial encounter for closed fracture: Secondary | ICD-10-CM

## 2014-01-19 ENCOUNTER — Ambulatory Visit (INDEPENDENT_AMBULATORY_CARE_PROVIDER_SITE_OTHER): Payer: Medicare HMO | Admitting: Pharmacist

## 2014-01-19 DIAGNOSIS — J811 Chronic pulmonary edema: Secondary | ICD-10-CM

## 2014-01-19 DIAGNOSIS — I4891 Unspecified atrial fibrillation: Secondary | ICD-10-CM

## 2014-01-19 LAB — POCT INR: INR: 2.6

## 2014-01-19 NOTE — Patient Instructions (Signed)
Anticoagulation Dose Instructions as of 01/19/2014     Kimberly Obrien Tue Wed Thu Fri Sat   New Dose 2.5 mg 2.5 mg 2.5 mg 2.5 mg 2.5 mg 2.5 mg 2.5 mg    Description       Continue current warfarin dose of 1/2 tablet daily.       INR was 2.6 today

## 2014-01-20 ENCOUNTER — Encounter: Payer: Self-pay | Admitting: *Deleted

## 2014-01-21 ENCOUNTER — Telehealth: Payer: Self-pay | Admitting: *Deleted

## 2014-01-21 NOTE — Telephone Encounter (Signed)
Message left on voice mail requesting stress test results.    Returned call to patient to notify of normal results.  Keep already scheduled follow up for 02/18/2014 with Dr. Bronson Ing.

## 2014-01-26 ENCOUNTER — Telehealth: Payer: Self-pay

## 2014-01-26 NOTE — Telephone Encounter (Signed)
Patient does not know why she is being referred to ortho.  Was told Xray was OK

## 2014-01-26 NOTE — Telephone Encounter (Signed)
The patient has a fractured ankle and this is why she is seeing the ortho doctor and that is why I referred the patient to  orthopedics

## 2014-01-30 ENCOUNTER — Ambulatory Visit: Payer: Medicare PPO | Admitting: Cardiovascular Disease

## 2014-02-18 ENCOUNTER — Ambulatory Visit (INDEPENDENT_AMBULATORY_CARE_PROVIDER_SITE_OTHER): Payer: Medicare PPO | Admitting: Cardiovascular Disease

## 2014-02-18 ENCOUNTER — Encounter: Payer: Self-pay | Admitting: Cardiovascular Disease

## 2014-02-18 VITALS — BP 115/72 | HR 70 | Ht 66.0 in | Wt 201.0 lb

## 2014-02-18 DIAGNOSIS — G4733 Obstructive sleep apnea (adult) (pediatric): Secondary | ICD-10-CM

## 2014-02-18 DIAGNOSIS — R0602 Shortness of breath: Secondary | ICD-10-CM

## 2014-02-18 DIAGNOSIS — R002 Palpitations: Secondary | ICD-10-CM

## 2014-02-18 DIAGNOSIS — I2699 Other pulmonary embolism without acute cor pulmonale: Secondary | ICD-10-CM

## 2014-02-18 DIAGNOSIS — R000 Tachycardia, unspecified: Secondary | ICD-10-CM

## 2014-02-18 NOTE — Patient Instructions (Signed)
Your physician has recommended that you wear a 7 day event monitor. Event monitors are medical devices that record the heart's electrical activity. Doctors most often [place] these monitors to diagnose arrhythmias. Arrhythmias are problems with the speed or rhythm of the heartbeat. The monitor is a small, portable device. You can wear one while you do your normal daily activities. This is usually used to diagnose what is causing palpitations/syncope (passing out). Office will contact with results via phone or letter.   Referral to Dr. Redmond Pulling for sleep study Continue all current medications. Follow up in  4-6 weeks

## 2014-02-18 NOTE — Progress Notes (Signed)
Patient ID: Kimberly Obrien, female   DOB: 10-Oct-1960, 53 y.o.   MRN: [id number]      SUBJECTIVE: The patient is here to followup for tachycardia, palpitations, and fatigue. She underwent a normal Lexiscan Cardiolite stress test. She also has a history of a pulmonary emboli and takes warfarin. She has not felt any alleviation of her symptoms with diltiazem. If she is at rest, she does alright. However, with any significant activity, she feels palpitations and gets short of breath. She denies any chest pain whatsoever nor any symptoms similar to what she experienced at the time of her pulmonary emboli.  Previous echocardiogram demonstrated normal left ventricular systolic function, EF 40-98%, normal diastolic function, and some suggestion for mild right ventricular dilatation, although there was suboptimal imaging of right-sided structures. Ventricular septal motion was suggestive of right ventricular pressure and volume overload, but again, this was suboptimally visualized. No tricuspid regurgitation was visualized.   Allergies  Allergen Reactions  . Aciphex [Rabeprazole Sodium]   . Claritin [Loratadine]   . Diflucan [Fluconazole]   . Doxycycline   . Ibuprofen   . Keflet [Cephalexin]   . Macrobid [Nitrofurantoin Macrocrystal]   . Nsaids   . Septra [Sulfamethoxazole-Trimethoprim]   . Crestor [Rosuvastatin] Other (See Comments)    Elevated blood glucose and abdominal pain    Current Outpatient Prescriptions  Medication Sig Dispense Refill  . albuterol (PROVENTIL HFA;VENTOLIN HFA) 108 (90 BASE) MCG/ACT inhaler Inhale 2 puffs into the lungs every 6 (six) hours as needed for wheezing or shortness of breath.  1 Inhaler  0  . budesonide-formoterol (SYMBICORT) 160-4.5 MCG/ACT inhaler Inhale 2 puffs into the lungs as needed.  1 Inhaler  11  . cholecalciferol (VITAMIN D) 1000 UNITS tablet Take 1,000 Units by mouth daily. 4 week treatment.      Marland Kitchen diltiazem (CARDIZEM) 60 MG tablet Take 1 tablet  (60 mg total) by mouth 3 (three) times daily.  90 tablet  6  . traMADol (ULTRAM) 50 MG tablet Take 1 tablet (50 mg total) by mouth every 8 (eight) hours as needed.  30 tablet  0  . warfarin (COUMADIN) 5 MG tablet Take 2.5 mg by mouth daily.       No current facility-administered medications for this visit.    Past Medical History  Diagnosis Date  . Anticoagulation monitoring by pharmacist   . PE (pulmonary embolism)   . Arthritis   . COPD (chronic obstructive pulmonary disease)   . Asthma     Past Surgical History  Procedure Laterality Date  . Tubal ligation    . Wrist surgery Right     Had surgery twice to shave bone for blood circulation improvement.  . Cholecystectomy    . Spine surgery      L5 S1 fusion  . Abdominal hysterectomy    . Eye surgery      Cataracts    History   Social History  . Marital Status: Married    Spouse Name: N/A    Number of Children: N/A  . Years of Education: N/A   Occupational History  . Not on file.   Social History Main Topics  . Smoking status: Former Smoker    Quit date: 04/21/2012  . Smokeless tobacco: Never Used  . Alcohol Use: No  . Drug Use: No  . Sexual Activity: Not on file   Other Topics Concern  . Not on file   Social History Narrative  . No narrative on file  Filed Vitals:   02/18/14 0951  BP: 115/72  Pulse: 70  Height: 5' 6"  (1.676 m)  Weight: 201 lb (91.173 kg)    PHYSICAL EXAM General: NAD Neck: No JVD, no thyromegaly. Lungs: Clear to auscultation bilaterally with normal respiratory effort. CV: Nondisplaced PMI.  Regular rate and rhythm, normal S1/S2, no S3/S4, no murmur. No pretibial or periankle edema.  No carotid bruit.  Normal pedal pulses.  Abdomen: Soft, nontender, no hepatosplenomegaly, no distention.  Neurologic: Alert and oriented x 3.  Psych: Normal affect. Extremities: No clubbing or cyanosis.   ECG: reviewed and available in electronic records.      ASSESSMENT AND PLAN: 1.  Tachycardia/palpitations: Continue short-acting diltiazem 60 mg three times daily. I would consider increasing this to long-acting 240 mg daily but will wait for test results. Will have to monitor blood pressure to avoid hypotension. Will obtain a 1 week event monitor to assess for atrial fibrillation or inappropriate sinus tachycardia. 2. Fatigue and SOB: Lexiscan Cardiolite stress test was normal, not revealing any evidence of occult ischemic heart disease. I will proceed with a sleep study. I may consider a V/Q scan in the future to rule out chronic thromboembolic pulmonary hypertension. 3. Pulmonary emboli: Continue warfarin.  I may consider a V/Q scan in the future to rule out chronic thromboembolic pulmonary hypertension.  Dispo: f/u 4-6 weeks.   Kate Sable, M.D., F.A.C.C.

## 2014-02-19 ENCOUNTER — Other Ambulatory Visit: Payer: Self-pay | Admitting: *Deleted

## 2014-02-19 DIAGNOSIS — R002 Palpitations: Secondary | ICD-10-CM

## 2014-02-19 DIAGNOSIS — R000 Tachycardia, unspecified: Secondary | ICD-10-CM

## 2014-02-25 DIAGNOSIS — R002 Palpitations: Secondary | ICD-10-CM

## 2014-02-25 DIAGNOSIS — R000 Tachycardia, unspecified: Secondary | ICD-10-CM

## 2014-03-02 ENCOUNTER — Ambulatory Visit (INDEPENDENT_AMBULATORY_CARE_PROVIDER_SITE_OTHER): Payer: Medicare HMO | Admitting: Pharmacist

## 2014-03-02 DIAGNOSIS — I4891 Unspecified atrial fibrillation: Secondary | ICD-10-CM

## 2014-03-02 DIAGNOSIS — J811 Chronic pulmonary edema: Secondary | ICD-10-CM

## 2014-03-02 LAB — POCT INR: INR: 2.3

## 2014-03-02 NOTE — Patient Instructions (Signed)
Anticoagulation Dose Instructions as of 03/02/2014     Kimberly Obrien Tue Wed Thu Fri Sat   New Dose 2.5 mg 2.5 mg 2.5 mg 2.5 mg 2.5 mg 2.5 mg 2.5 mg    Description       Continue current warfarin dose of 1/2 tablet daily.      INR was 2.3 today

## 2014-03-17 ENCOUNTER — Ambulatory Visit (INDEPENDENT_AMBULATORY_CARE_PROVIDER_SITE_OTHER): Payer: Medicare HMO | Admitting: Family Medicine

## 2014-03-17 ENCOUNTER — Encounter: Payer: Self-pay | Admitting: Family Medicine

## 2014-03-17 VITALS — BP 120/70 | HR 86 | Temp 99.5°F | Ht 66.0 in | Wt 201.8 lb

## 2014-03-17 DIAGNOSIS — E559 Vitamin D deficiency, unspecified: Secondary | ICD-10-CM

## 2014-03-17 DIAGNOSIS — N3000 Acute cystitis without hematuria: Secondary | ICD-10-CM

## 2014-03-17 DIAGNOSIS — R3915 Urgency of urination: Secondary | ICD-10-CM

## 2014-03-17 DIAGNOSIS — I4891 Unspecified atrial fibrillation: Secondary | ICD-10-CM

## 2014-03-17 LAB — POCT URINALYSIS DIPSTICK
Bilirubin, UA: NEGATIVE
Glucose, UA: NEGATIVE
Ketones, UA: NEGATIVE
Nitrite, UA: NEGATIVE
Protein, UA: NEGATIVE
Spec Grav, UA: 1.025
Urobilinogen, UA: NEGATIVE
pH, UA: 5

## 2014-03-17 LAB — POCT CBC
Granulocyte percent: 70.1 %G (ref 37–80)
HCT, POC: 43.9 % (ref 37.7–47.9)
Hemoglobin: 14.5 g/dL (ref 12.2–16.2)
Lymph, poc: 2.8 (ref 0.6–3.4)
MCH, POC: 28.2 pg (ref 27–31.2)
MCHC: 33.2 g/dL (ref 31.8–35.4)
MCV: 85.2 fL (ref 80–97)
MPV: 8.2 fL (ref 0–99.8)
POC Granulocyte: 7.4 — AB (ref 2–6.9)
POC LYMPH PERCENT: 26.8 %L (ref 10–50)
Platelet Count, POC: 185 10*3/uL (ref 142–424)
RBC: 5.2 M/uL (ref 4.04–5.48)
RDW, POC: 13.4 %
WBC: 10.6 10*3/uL — AB (ref 4.6–10.2)

## 2014-03-17 LAB — POCT UA - MICROSCOPIC ONLY
Bacteria, U Microscopic: NEGATIVE
Casts, Ur, LPF, POC: NEGATIVE
Crystals, Ur, HPF, POC: NEGATIVE
Yeast, UA: NEGATIVE

## 2014-03-17 MED ORDER — CIPROFLOXACIN HCL 500 MG PO TABS: 500.0000 mg | ORAL_TABLET | Freq: Two times a day (BID) | ORAL | Status: DC

## 2014-03-17 MED ORDER — WARFARIN SODIUM 5 MG PO TABS: 2.5000 mg | ORAL_TABLET | Freq: Every day | ORAL | Status: DC

## 2014-03-17 NOTE — Progress Notes (Signed)
   Subjective:    Patient ID: Kimberly Obrien, female    DOB: 1961-01-05, 53 y.o.   MRN: [id number]  HPI  This 53 y.o. female presents for evaluation of urinary frequency and fatigue.  She has hx of atrial fibrillation and tachycardia.  She states she gets SOB when walking across the room.  She is still seeing Cardiology and she states they do not know why she is in Afib and have sent her for a sleep study.  She has been having some urinary frequency.  She has hx of vitamin D deficiency.  Review of Systems C/'o urine freq and tachcardia.   No chest pain, SOB, HA, dizziness, vision change, N/V, diarrhea, constipation, myalgias, arthralgias or rash.  Objective:   Physical Exam Vital signs noted  Well developed well nourished female.  HEENT - Head atraumatic Normocephalic                Eyes - PERRLA, Conjuctiva - clear Sclera- Clear EOMI                Ears - EAC's Wnl TM's Wnl Gross Hearing WNL                Throat - oropharanx wnl Respiratory - Lungs CTA bilateral Cardiac - RRR S1 and S2 without murmur GI - Abdomen soft Nontender and bowel sounds active x 4 Extremities - No edema. Neuro - Grossly intact.       Assessment & Plan:  Urinary urgency - Plan: POCT UA - Microscopic Only, POCT urinalysis dipstick  Atrial fibrillation with RVR - Plan: warfarin (COUMADIN) 5 MG tablet, BMP8+EGFR, POCT CBC. Follow up with Cardiology. Follow up for sleep study scheduled by cardiology.  Unspecified vitamin D deficiency - Plan: Vit D  25 hydroxy (rtn osteoporosis monitoring)  UTI - Cipro 545m one po bid x 7 days #14 UA cx  Follow in 3 months  WLysbeth PennerFNP

## 2014-03-18 ENCOUNTER — Other Ambulatory Visit: Payer: Self-pay | Admitting: Family Medicine

## 2014-03-18 LAB — URINE CULTURE

## 2014-03-18 LAB — BMP8+EGFR
BUN/Creatinine Ratio: 10 (ref 9–23)
BUN: 8 mg/dL (ref 6–24)
CO2: 25 mmol/L (ref 18–29)
Calcium: 9.3 mg/dL (ref 8.7–10.2)
Chloride: 101 mmol/L (ref 97–108)
Creatinine, Ser: 0.78 mg/dL (ref 0.57–1.00)
GFR calc Af Amer: 100 mL/min/{1.73_m2} (ref >59)
GFR calc non Af Amer: 87 mL/min/{1.73_m2} (ref >59)
Glucose: 99 mg/dL (ref 65–99)
Potassium: 3.6 mmol/L (ref 3.5–5.2)
Sodium: 142 mmol/L (ref 134–144)

## 2014-03-18 LAB — VITAMIN D 25 HYDROXY (VIT D DEFICIENCY, FRACTURES): Vit D, 25-Hydroxy: 21 ng/mL — ABNORMAL LOW (ref 30.0–100.0)

## 2014-03-18 MED ORDER — VITAMIN D (ERGOCALCIFEROL) 1.25 MG (50000 UNIT) PO CAPS: 50000.0000 [IU] | ORAL_CAPSULE | ORAL | Status: DC

## 2014-04-02 ENCOUNTER — Ambulatory Visit: Payer: Medicare HMO | Admitting: Cardiovascular Disease

## 2014-04-15 ENCOUNTER — Ambulatory Visit (INDEPENDENT_AMBULATORY_CARE_PROVIDER_SITE_OTHER): Payer: Medicare HMO | Admitting: Pharmacist

## 2014-04-15 DIAGNOSIS — I4891 Unspecified atrial fibrillation: Secondary | ICD-10-CM

## 2014-04-15 DIAGNOSIS — J811 Chronic pulmonary edema: Secondary | ICD-10-CM

## 2014-04-15 LAB — POCT INR: INR: 2.8

## 2014-04-15 MED ORDER — WARFARIN SODIUM 5 MG PO TABS: 2.5000 mg | ORAL_TABLET | Freq: Every day | ORAL | Status: DC

## 2014-04-15 NOTE — Patient Instructions (Signed)
Anticoagulation Dose Instructions as of 04/15/2014     Kimberly Obrien Tue Wed Thu Fri Sat   New Dose 2.5 mg 2.5 mg 2.5 mg 2.5 mg 2.5 mg 2.5 mg 2.5 mg    Description       Continue current warfarin dose of 1/2 tablet daily.       INR was 2.8 today

## 2014-04-23 ENCOUNTER — Encounter: Payer: Self-pay | Admitting: Cardiovascular Disease

## 2014-04-23 ENCOUNTER — Ambulatory Visit (INDEPENDENT_AMBULATORY_CARE_PROVIDER_SITE_OTHER): Payer: Commercial Managed Care - HMO | Admitting: Cardiovascular Disease

## 2014-04-23 ENCOUNTER — Telehealth: Payer: Self-pay | Admitting: Cardiovascular Disease

## 2014-04-23 VITALS — BP 122/75 | HR 78 | Ht 66.0 in | Wt 201.0 lb

## 2014-04-23 DIAGNOSIS — I2699 Other pulmonary embolism without acute cor pulmonale: Secondary | ICD-10-CM

## 2014-04-23 DIAGNOSIS — R0609 Other forms of dyspnea: Secondary | ICD-10-CM

## 2014-04-23 DIAGNOSIS — R0989 Other specified symptoms and signs involving the circulatory and respiratory systems: Secondary | ICD-10-CM

## 2014-04-23 DIAGNOSIS — R000 Tachycardia, unspecified: Secondary | ICD-10-CM

## 2014-04-23 DIAGNOSIS — R5383 Other fatigue: Secondary | ICD-10-CM

## 2014-04-23 DIAGNOSIS — R002 Palpitations: Secondary | ICD-10-CM

## 2014-04-23 DIAGNOSIS — R011 Cardiac murmur, unspecified: Secondary | ICD-10-CM

## 2014-04-23 DIAGNOSIS — R0600 Dyspnea, unspecified: Secondary | ICD-10-CM

## 2014-04-23 DIAGNOSIS — R5381 Other malaise: Secondary | ICD-10-CM

## 2014-04-23 DIAGNOSIS — G4733 Obstructive sleep apnea (adult) (pediatric): Secondary | ICD-10-CM

## 2014-04-23 MED ORDER — METOPROLOL TARTRATE 25 MG PO TABS: 25.0000 mg | ORAL_TABLET | Freq: Two times a day (BID) | ORAL | Status: DC

## 2014-04-23 NOTE — Telephone Encounter (Signed)
Checking percert for the CTA

## 2014-04-23 NOTE — Patient Instructions (Signed)
   Start Metoprolol 25mg  twice a day. Sent to pharmacy Continue all other medications.   Non-Cardiac CT Angiography (CTA), is a special type of CT scan that uses a computer to produce multi-dimensional views of major blood vessels throughout the body. In CT angiography, a contrast material is injected through an IV to help visualize the blood vessels. (CHEST) Office will contact with results via phone or letter.   Follow up in 2 months

## 2014-04-23 NOTE — Progress Notes (Signed)
Patient ID: Kimberly Obrien, female   DOB: 1960-09-27, 53 y.o.   MRN: [id number]      SUBJECTIVE: The patient is here to followup for tachycardia, dyspnea with exertion, fatigue and palpitations. One-week event monitoring demonstrated sinus rhythm and sinus tachycardia with one PVC. Symptoms correlated primarily with sinus tachycardia which occurred while patient was walking. There was no evidence of atrial fibrillation. Nuclear stress testing in May 2015 was normal. Echocardiogram in 11/2013 demonstrated normal left ventricular systolic function, EF 87-86%, normal diastolic function, and some suggestion for mild right ventricular dilatation, although there was suboptimal imaging of right-sided structures. Ventricular septal motion was suggestive of right ventricular pressure and volume overload, but again, this was suboptimally visualized. No tricuspid regurgitation was visualized.  As the diltiazem did not alleviate her symptoms she stopped taking it. She continues to feel dyspneic and fatigued with palpitations with minimal exertion. Her first large pulmonary embolism was reportedly in December 1999 with 3 small pulmonary emboli in 2003.     Review of Systems: As per "subjective", otherwise negative.  Allergies  Allergen Reactions  . Claritin [Loratadine]   . Diflucan [Fluconazole]   . Doxycycline   . Ibuprofen   . Keflet [Cephalexin]   . Macrobid [Nitrofurantoin Macrocrystal]   . Nsaids   . Septra [Sulfamethoxazole-Trimethoprim]   . Aciphex [Rabeprazole Sodium] Rash  . Crestor [Rosuvastatin] Other (See Comments)    Elevated blood glucose and abdominal pain    Current Outpatient Prescriptions  Medication Sig Dispense Refill  . albuterol (PROVENTIL HFA;VENTOLIN HFA) 108 (90 BASE) MCG/ACT inhaler Inhale 2 puffs into the lungs every 6 (six) hours as needed for wheezing or shortness of breath.  1 Inhaler  0  . budesonide-formoterol (SYMBICORT) 160-4.5 MCG/ACT inhaler Inhale 2 puffs  into the lungs as needed.  1 Inhaler  11  . omeprazole (PRILOSEC) 40 MG capsule Take 40 mg by mouth daily.      . traMADol (ULTRAM) 50 MG tablet Take 1 tablet (50 mg total) by mouth every 8 (eight) hours as needed.  30 tablet  0  . Vitamin D, Ergocalciferol, (DRISDOL) 50000 UNITS CAPS capsule Take 1 capsule (50,000 Units total) by mouth every 7 (seven) days.  18 capsule  0  . warfarin (COUMADIN) 5 MG tablet Take 0.5 tablets (2.5 mg total) by mouth daily. Take 1/2 to 1 tablet by mouth once daily (50 = 90 days supply)  50 tablet  0   No current facility-administered medications for this visit.    Past Medical History  Diagnosis Date  . Anticoagulation monitoring by pharmacist   . PE (pulmonary embolism)   . Arthritis   . COPD (chronic obstructive pulmonary disease)   . Asthma     Past Surgical History  Procedure Laterality Date  . Tubal ligation    . Wrist surgery Right     Had surgery twice to shave bone for blood circulation improvement.  . Cholecystectomy    . Spine surgery      L5 S1 fusion  . Abdominal hysterectomy    . Eye surgery      Cataracts    History   Social History  . Marital Status: Married    Spouse Name: N/A    Number of Children: N/A  . Years of Education: N/A   Occupational History  . Not on file.   Social History Main Topics  . Smoking status: Former Smoker -- 2.00 packs/day for 34 years    Types: Cigarettes  Start date: 08/22/1977    Quit date: 04/21/2012  . Smokeless tobacco: Never Used  . Alcohol Use: No  . Drug Use: No  . Sexual Activity: Not on file   Other Topics Concern  . Not on file   Social History Narrative  . No narrative on file     Filed Vitals:   04/23/14 1352  Height: 5' 6"  (1.676 m)  Weight: 201 lb (91.173 kg)   BP 122/75 Pulse 78   PHYSICAL EXAM General: NAD, overweight. HEENT: Normal. Poor dentition. Neck: No JVD, no thyromegaly. Lungs: Clear to auscultation bilaterally with normal respiratory effort. CV:  Nondisplaced PMI.  Regular rate and rhythm, normal S1/S2, no S3/S4, II/VI holosystolic murmur along left sternal border. No pretibial or periankle edema. Abdomen: Soft, obese, no distention.  Neurologic: Alert and oriented x 3.  Psych: Normal affect. Skin: Normal. Musculoskeletal: Normal range of motion, no gross deformities. Extremities: No clubbing or cyanosis.   ECG: Most recent ECG reviewed.      ASSESSMENT AND PLAN: 1. Tachycardia/palpitations: I will initiate metoprolol 25 mg bid. Will have to monitor blood pressure to avoid hypotension.  2. Fatigue and SOB: Lexiscan Cardiolite stress test was normal, not revealing any evidence of occult ischemic heart disease. Sleep study results are pending. I will proceed with CT angiography of the chest given her history of multiple pulmonary emboli. If this is unrevealing, I may obtain right heart catheterization. 3. Pulmonary emboli: Continue warfarin. I will proceed with CT angiography of the chest given her history of multiple pulmonary emboli.  Dispo: f/u 2 months.  Kate Sable, M.D., F.A.C.C.

## 2014-04-28 ENCOUNTER — Ambulatory Visit (HOSPITAL_COMMUNITY): Payer: Medicare HMO

## 2014-04-28 NOTE — Telephone Encounter (Signed)
[place], [place] Auth#[id number], CPT [phone], valid 04-28-14 to 10-27-14

## 2014-04-30 ENCOUNTER — Ambulatory Visit (HOSPITAL_COMMUNITY)
Admission: RE | Admit: 2014-04-30 | Discharge: 2014-04-30 | Disposition: A | Discharge status: Home / Self Care | Payer: Medicare HMO | Source: Ambulatory Visit | Attending: Cardiovascular Disease | Admitting: Cardiovascular Disease

## 2014-04-30 DIAGNOSIS — I5189 Other ill-defined heart diseases: Secondary | ICD-10-CM | POA: Diagnosis not present

## 2014-04-30 DIAGNOSIS — J8410 Pulmonary fibrosis, unspecified: Secondary | ICD-10-CM | POA: Insufficient documentation

## 2014-04-30 DIAGNOSIS — R0602 Shortness of breath: Secondary | ICD-10-CM | POA: Diagnosis present

## 2014-04-30 DIAGNOSIS — Z86711 Personal history of pulmonary embolism: Secondary | ICD-10-CM | POA: Diagnosis not present

## 2014-04-30 DIAGNOSIS — I2699 Other pulmonary embolism without acute cor pulmonale: Secondary | ICD-10-CM

## 2014-04-30 MED ORDER — IOHEXOL 350 MG/ML SOLN
100.0000 mL | Freq: Once | INTRAVENOUS | Status: AC | PRN
  Administered 2014-04-30: 100 mL via INTRAVENOUS

## 2014-05-04 ENCOUNTER — Telehealth: Payer: Self-pay | Admitting: *Deleted

## 2014-05-04 ENCOUNTER — Telehealth: Payer: Self-pay | Admitting: Pharmacist Clinician (PhC)/ Clinical Pharmacy Specialist

## 2014-05-04 NOTE — Telephone Encounter (Signed)
Called [facility] regarding coumadin bridging for heart cath. Tammy (Pharm D) is already out of the office and will not be in tomorrow. Person covering for her will return my call.

## 2014-05-05 ENCOUNTER — Telehealth: Payer: Self-pay | Admitting: *Deleted

## 2014-05-05 ENCOUNTER — Other Ambulatory Visit: Payer: Self-pay | Admitting: Cardiovascular Disease

## 2014-05-05 DIAGNOSIS — R0609 Other forms of dyspnea: Secondary | ICD-10-CM

## 2014-05-05 DIAGNOSIS — R0600 Dyspnea, unspecified: Secondary | ICD-10-CM

## 2014-05-05 DIAGNOSIS — R000 Tachycardia, unspecified: Secondary | ICD-10-CM

## 2014-05-05 DIAGNOSIS — R079 Chest pain, unspecified: Secondary | ICD-10-CM

## 2014-05-05 DIAGNOSIS — R5383 Other fatigue: Secondary | ICD-10-CM

## 2014-05-05 NOTE — Telephone Encounter (Signed)
Called Cardiologist in reference to patient going off warfarin for a heart cath.  She went of warfarin in December for 3 days for a colonoscopy with no complications.  Her [place] score is 3- moderate risk.  Both of her prior PEs were when she was smoking and taking OCs, which she is doing neither now.  Her cardiologist will make the final call on bridging with lovenox or not and call me back.  She has normal EF function.

## 2014-05-05 NOTE — Telephone Encounter (Signed)
Called pt regarding scheduling heart cath. Pt is advised to hold coumadin for 5 days. Due to previously scheduled sleep study, the earliest she can have it done will be 9/23. Pt does not wish to have early appointment.

## 2014-05-05 NOTE — Telephone Encounter (Signed)
Patient notified that cath is scheduled for 9/23 at 1130. Arrive at 24 With Dr. Burt Knack  Patient advised that last coumadin dose will be 9/17

## 2014-05-05 NOTE — Telephone Encounter (Signed)
I spoke with Sharyn Lull (PharmD filling in at [facility])  She stated that In december she was off of her coumadin for 3 days for a procedure and did fine. At that time her risk factors were higher because she was smoking and on birth control. She is no longer doing either of those and believes she will be fine to hold her coumadin for 5 days.  I will relay information to Dr. Bronson Ing.

## 2014-05-08 ENCOUNTER — Encounter (HOSPITAL_COMMUNITY): Payer: Self-pay | Admitting: Pharmacy Technician

## 2014-05-12 ENCOUNTER — Telehealth: Payer: Self-pay | Admitting: *Deleted

## 2014-05-12 NOTE — Telephone Encounter (Signed)
Pt had questions regarding her heart cath.

## 2014-05-13 ENCOUNTER — Ambulatory Visit (HOSPITAL_COMMUNITY)
Admission: RE | Admit: 2014-05-13 | Discharge: 2014-05-13 | Disposition: A | Discharge status: Home / Self Care | Payer: Medicare HMO | Source: Ambulatory Visit | Attending: Cardiovascular Disease | Admitting: Cardiovascular Disease

## 2014-05-13 ENCOUNTER — Encounter (HOSPITAL_COMMUNITY)
Admission: RE | Disposition: A | Discharge status: Home / Self Care | Payer: Self-pay | Source: Ambulatory Visit | Attending: Cardiovascular Disease

## 2014-05-13 DIAGNOSIS — R0609 Other forms of dyspnea: Secondary | ICD-10-CM | POA: Diagnosis not present

## 2014-05-13 DIAGNOSIS — Z7901 Long term (current) use of anticoagulants: Secondary | ICD-10-CM | POA: Diagnosis not present

## 2014-05-13 DIAGNOSIS — R0600 Dyspnea, unspecified: Secondary | ICD-10-CM

## 2014-05-13 DIAGNOSIS — R002 Palpitations: Secondary | ICD-10-CM | POA: Diagnosis not present

## 2014-05-13 DIAGNOSIS — Z87891 Personal history of nicotine dependence: Secondary | ICD-10-CM | POA: Diagnosis not present

## 2014-05-13 DIAGNOSIS — R079 Chest pain, unspecified: Secondary | ICD-10-CM

## 2014-05-13 DIAGNOSIS — M129 Arthropathy, unspecified: Secondary | ICD-10-CM | POA: Insufficient documentation

## 2014-05-13 DIAGNOSIS — Z86711 Personal history of pulmonary embolism: Secondary | ICD-10-CM | POA: Insufficient documentation

## 2014-05-13 DIAGNOSIS — R000 Tachycardia, unspecified: Secondary | ICD-10-CM

## 2014-05-13 DIAGNOSIS — R0989 Other specified symptoms and signs involving the circulatory and respiratory systems: Secondary | ICD-10-CM | POA: Insufficient documentation

## 2014-05-13 DIAGNOSIS — R5383 Other fatigue: Secondary | ICD-10-CM

## 2014-05-13 DIAGNOSIS — J449 Chronic obstructive pulmonary disease, unspecified: Secondary | ICD-10-CM | POA: Insufficient documentation

## 2014-05-13 DIAGNOSIS — J4489 Other specified chronic obstructive pulmonary disease: Secondary | ICD-10-CM | POA: Insufficient documentation

## 2014-05-13 HISTORY — PX: LEFT AND RIGHT HEART CATHETERIZATION WITH CORONARY ANGIOGRAM: SHX5449

## 2014-05-13 LAB — PROTIME-INR
INR: 1.23 (ref 0.00–1.49)
Prothrombin Time: 15.5 seconds — ABNORMAL HIGH (ref 11.6–15.2)

## 2014-05-13 LAB — CBC
HCT: 41.3 % (ref 36.0–46.0)
Hemoglobin: 14.4 g/dL (ref 12.0–15.0)
MCH: 29.1 pg (ref 26.0–34.0)
MCHC: 34.9 g/dL (ref 30.0–36.0)
MCV: 83.4 fL (ref 78.0–100.0)
Platelets: 161 10*3/uL (ref 150–400)
RBC: 4.95 MIL/uL (ref 3.87–5.11)
RDW: 13.6 % (ref 11.5–15.5)
WBC: 9.3 10*3/uL (ref 4.0–10.5)

## 2014-05-13 LAB — POCT I-STAT 3, ART BLOOD GAS (G3+)
Acid-Base Excess: 1 mmol/L (ref 0.0–2.0)
Bicarbonate: 26.4 mEq/L — ABNORMAL HIGH (ref 20.0–24.0)
O2 Saturation: 98 %
TCO2: 28 mmol/L (ref 0–100)
pCO2 arterial: 42.7 mmHg (ref 35.0–45.0)
pH, Arterial: 7.4 (ref 7.350–7.450)
pO2, Arterial: 101 mmHg — ABNORMAL HIGH (ref 80.0–100.0)

## 2014-05-13 LAB — BASIC METABOLIC PANEL
Anion gap: 11 (ref 5–15)
BUN: 10 mg/dL (ref 6–23)
CO2: 28 mEq/L (ref 19–32)
Calcium: 9.2 mg/dL (ref 8.4–10.5)
Chloride: 102 mEq/L (ref 96–112)
Creatinine, Ser: 0.64 mg/dL (ref 0.50–1.10)
GFR calc Af Amer: >90 mL/min (ref >90)
GFR calc non Af Amer: >90 mL/min (ref >90)
Glucose, Bld: 119 mg/dL — ABNORMAL HIGH (ref 70–99)
Potassium: 3.8 mEq/L (ref 3.7–5.3)
Sodium: 141 mEq/L (ref 137–147)

## 2014-05-13 LAB — POCT I-STAT 3, VENOUS BLOOD GAS (G3P V)
Acid-Base Excess: 2 mmol/L (ref 0.0–2.0)
Bicarbonate: 27.5 mEq/L — ABNORMAL HIGH (ref 20.0–24.0)
O2 Saturation: 73 %
TCO2: 29 mmol/L (ref 0–100)
pCO2, Ven: 46.7 mmHg (ref 45.0–50.0)
pH, Ven: 7.379 — ABNORMAL HIGH (ref 7.250–7.300)
pO2, Ven: 40 mmHg (ref 30.0–45.0)

## 2014-05-13 SURGERY — LEFT AND RIGHT HEART CATHETERIZATION WITH CORONARY ANGIOGRAM
Anesthesia: LOCAL

## 2014-05-13 MED ORDER — SODIUM CHLORIDE 0.9 % IV SOLN: 250.0000 mL | INTRAVENOUS | Status: DC | PRN

## 2014-05-13 MED ORDER — FENTANYL CITRATE 0.05 MG/ML IJ SOLN
INTRAMUSCULAR | Status: AC
  Filled 2014-05-13: qty 2

## 2014-05-13 MED ORDER — LIDOCAINE HCL (PF) 1 % IJ SOLN
INTRAMUSCULAR | Status: AC
  Filled 2014-05-13: qty 30

## 2014-05-13 MED ORDER — SODIUM CHLORIDE 0.9 % IJ SOLN: 3.0000 mL | Freq: Two times a day (BID) | INTRAMUSCULAR | Status: DC

## 2014-05-13 MED ORDER — ASPIRIN 81 MG PO CHEW: 81.0000 mg | CHEWABLE_TABLET | ORAL | Status: DC

## 2014-05-13 MED ORDER — NITROGLYCERIN 1 MG/10 ML FOR IR/CATH LAB
INTRA_ARTERIAL | Status: AC
  Filled 2014-05-13: qty 10

## 2014-05-13 MED ORDER — HEPARIN (PORCINE) IN NACL 2-0.9 UNIT/ML-% IJ SOLN
INTRAMUSCULAR | Status: AC
  Filled 2014-05-13: qty 1000

## 2014-05-13 MED ORDER — SODIUM CHLORIDE 0.9 % IV SOLN: INTRAVENOUS | Status: DC

## 2014-05-13 MED ORDER — MIDAZOLAM HCL 2 MG/2ML IJ SOLN
INTRAMUSCULAR | Status: AC
  Filled 2014-05-13: qty 2

## 2014-05-13 MED ORDER — SODIUM CHLORIDE 0.9 % IJ SOLN: 3.0000 mL | INTRAMUSCULAR | Status: DC | PRN

## 2014-05-13 MED ORDER — VERAPAMIL HCL 2.5 MG/ML IV SOLN
INTRAVENOUS | Status: AC
  Filled 2014-05-13: qty 2

## 2014-05-13 MED ORDER — HEPARIN SODIUM (PORCINE) 1000 UNIT/ML IJ SOLN
INTRAMUSCULAR | Status: AC
  Filled 2014-05-13: qty 1

## 2014-05-13 NOTE — Interval H&P Note (Signed)
History and Physical Interval Note:  05/13/2014 12:12 PM  Kimberly Obrien  has presented today for cardiac cath with the diagnosis of cp/fatigue,dyspnea. The various methods of treatment have been discussed with the patient and family. After consideration of risks, benefits and other options for treatment, the patient has consented to  Procedure(s): LEFT AND RIGHT HEART CATHETERIZATION WITH CORONARY ANGIOGRAM (N/A) as a surgical intervention .  The patient's history has been reviewed, patient examined, no change in status, stable for surgery.  I have reviewed the patient's chart and labs.  Questions were answered to the patient's satisfaction.    Cath Lab Visit (complete for each Cath Lab visit)  Clinical Evaluation Leading to the Procedure:   ACS: No.  Non-ACS:    Anginal Classification: CCS III  Anti-ischemic medical therapy: No Therapy  Non-Invasive Test Results: Low-risk stress test findings: cardiac mortality <1%/year  Prior CABG: No previous CABG        [name]

## 2014-05-13 NOTE — CV Procedure (Signed)
      Cardiac Catheterization Operative Report  Kimberly Obrien [id number] 9/23/201512:20 PM Redge Gainer, MD  Procedure Performed:  1. Left Heart Catheterization 2. Selective Coronary Angiography 3. Right Heart Catheterization 4. Left ventricular angiogram  Operator: Lauree Chandler, MD  Indication: 53 yo female with history of PE, COPD, asthma with recent worsened dyspnea. CTA negative for PE. Evidence of CAD on CTA of chest. Right and left heart cath today to exclude CAD and assess pulmonary pressures.                          Procedure Details: The risks, benefits, complications, treatment options, and expected outcomes were discussed with the patient. The patient and/or family concurred with the proposed plan, giving informed consent. The patient was brought to the cath lab after IV hydration was begun and oral premedication was given. The patient was further sedated with Versed and Fentanyl. There was an antecubital IV catheter present. This area was prepped and draped. I then inserted a wire and replaced the small IV with a 5 [place] sheath. A balloon tipped catheter was used to perform a right heart catheterization. The right wrist was prepped and draped in the usual manner. Reverse Allens test positive. 1% lidocaine used for local anesthesia. A 5 French sheath was placed in the right radial artery. 3 mg Verapamil given through the sheath.  Standard diagnostic catheters were used to perform selective coronary angiography. A pigtail catheter was used to perform a left ventricular angiogram. There were no immediate complications. The patient was taken to the recovery area in stable condition.   Hemodynamic Findings: Ao:  103/60             LV: 106/1/6 RA:  6           RV: 23/1/5 PA:  23/5 (mean 17)      PCWP: 5  Fick Cardiac Output: 5.3 L/min Fick Cardiac Index: 2.7 L/min/m2 Central Aortic Saturation: 98% Pulmonary Artery Saturation: 73%  Angiographic  Findings:  Left main: No obstructive disease.   Left Anterior Descending Artery: Large caliber vessel that courses to the apex. There are two small caliber diagonal branches. No obstructive disease.   Circumflex Artery: Moderate caliber vessel with small first and second obtuse marginal branches. No obstructive disease.   Right Coronary Artery: Large caliber dominant vessel with no obstructive disease.   Left Ventricular Angiogram: LVEF=60-65%.   Impression: 1. No angiographic evidence of CAD 2. Normal LV systolic function 3. Normal right sided pressures/PA pressure  Recommendations: No further ischemic evaluation.        Complications:  None; patient tolerated the procedure well.

## 2014-05-13 NOTE — Discharge Instructions (Signed)
Resume coumadin tonight. She will need to f/u with her primary care doctor for INR next week.    Radial Site Care Refer to this sheet in the next few weeks. These instructions provide you with information on caring for yourself after your procedure. Your caregiver may also give you more specific instructions. Your treatment has been planned according to current medical practices, but problems sometimes occur. Call your caregiver if you have any problems or questions after your procedure. HOME CARE INSTRUCTIONS  You may shower the day after the procedure.Remove the bandage (dressing) and gently wash the site with plain soap and water.Gently pat the site dry.  Do not apply powder or lotion to the site.  Do not submerge the affected site in water for 3 to 5 days.  Inspect the site at least twice daily.  Do not flex or bend the affected arm for 24 hours.  No lifting over 5 pounds (2.3 kg) for 5 days after your procedure.  Do not drive home if you are discharged the same day of the procedure. Have someone else drive you.  You may drive 24 hours after the procedure unless otherwise instructed by your caregiver.  Do not operate machinery or power tools for 24 hours.  A responsible adult should be with you for the first 24 hours after you arrive home. What to expect:  Any bruising will usually fade within 1 to 2 weeks.  Blood that collects in the tissue (hematoma) may be painful to the touch. It should usually decrease in size and tenderness within 1 to 2 weeks. SEEK IMMEDIATE MEDICAL CARE IF:  You have unusual pain at the radial site.  You have redness, warmth, swelling, or pain at the radial site.  You have drainage (other than a small amount of blood on the dressing).  You have chills.  You have a fever or persistent symptoms for more than 72 hours.  You have a fever and your symptoms suddenly get worse.  Your arm becomes pale, cool, tingly, or numb.  You have heavy  bleeding from the site. Hold pressure on the site. Document Released: 09/09/2010 Document Revised: 10/30/2011 Document Reviewed: 09/09/2010 [facility] Patient Information 2015 [place], [place]. This information is not intended to replace advice given to you by your health care provider. Make sure you discuss any questions you have with your health care provider.

## 2014-05-13 NOTE — H&P (View-Only) (Signed)
Patient ID: Kimberly Obrien, female   DOB: August 09, 1961, 53 y.o.   MRN: [id number]      SUBJECTIVE: The patient is here to followup for tachycardia, dyspnea with exertion, fatigue and palpitations. One-week event monitoring demonstrated sinus rhythm and sinus tachycardia with one PVC. Symptoms correlated primarily with sinus tachycardia which occurred while patient was walking. There was no evidence of atrial fibrillation. Nuclear stress testing in May 2015 was normal. Echocardiogram in 11/2013 demonstrated normal left ventricular systolic function, EF 44-01%, normal diastolic function, and some suggestion for mild right ventricular dilatation, although there was suboptimal imaging of right-sided structures. Ventricular septal motion was suggestive of right ventricular pressure and volume overload, but again, this was suboptimally visualized. No tricuspid regurgitation was visualized.  As the diltiazem did not alleviate her symptoms she stopped taking it. She continues to feel dyspneic and fatigued with palpitations with minimal exertion. Her first large pulmonary embolism was reportedly in December 1999 with 3 small pulmonary emboli in 2003.     Review of Systems: As per "subjective", otherwise negative.  Allergies  Allergen Reactions  . Claritin [Loratadine]   . Diflucan [Fluconazole]   . Doxycycline   . Ibuprofen   . Keflet [Cephalexin]   . Macrobid [Nitrofurantoin Macrocrystal]   . Nsaids   . Septra [Sulfamethoxazole-Trimethoprim]   . Aciphex [Rabeprazole Sodium] Rash  . Crestor [Rosuvastatin] Other (See Comments)    Elevated blood glucose and abdominal pain    Current Outpatient Prescriptions  Medication Sig Dispense Refill  . albuterol (PROVENTIL HFA;VENTOLIN HFA) 108 (90 BASE) MCG/ACT inhaler Inhale 2 puffs into the lungs every 6 (six) hours as needed for wheezing or shortness of breath.  1 Inhaler  0  . budesonide-formoterol (SYMBICORT) 160-4.5 MCG/ACT inhaler Inhale 2 puffs  into the lungs as needed.  1 Inhaler  11  . omeprazole (PRILOSEC) 40 MG capsule Take 40 mg by mouth daily.      . traMADol (ULTRAM) 50 MG tablet Take 1 tablet (50 mg total) by mouth every 8 (eight) hours as needed.  30 tablet  0  . Vitamin D, Ergocalciferol, (DRISDOL) 50000 UNITS CAPS capsule Take 1 capsule (50,000 Units total) by mouth every 7 (seven) days.  18 capsule  0  . warfarin (COUMADIN) 5 MG tablet Take 0.5 tablets (2.5 mg total) by mouth daily. Take 1/2 to 1 tablet by mouth once daily (50 = 90 days supply)  50 tablet  0   No current facility-administered medications for this visit.    Past Medical History  Diagnosis Date  . Anticoagulation monitoring by pharmacist   . PE (pulmonary embolism)   . Arthritis   . COPD (chronic obstructive pulmonary disease)   . Asthma     Past Surgical History  Procedure Laterality Date  . Tubal ligation    . Wrist surgery Right     Had surgery twice to shave bone for blood circulation improvement.  . Cholecystectomy    . Spine surgery      L5 S1 fusion  . Abdominal hysterectomy    . Eye surgery      Cataracts    History   Social History  . Marital Status: Married    Spouse Name: N/A    Number of Children: N/A  . Years of Education: N/A   Occupational History  . Not on file.   Social History Main Topics  . Smoking status: Former Smoker -- 2.00 packs/day for 34 years    Types: Cigarettes  Start date: 08/22/1977    Quit date: 04/21/2012  . Smokeless tobacco: Never Used  . Alcohol Use: No  . Drug Use: No  . Sexual Activity: Not on file   Other Topics Concern  . Not on file   Social History Narrative  . No narrative on file     Filed Vitals:   04/23/14 1352  Height: 5' 6"  (1.676 m)  Weight: 201 lb (91.173 kg)   BP 122/75 Pulse 78   PHYSICAL EXAM General: NAD, overweight. HEENT: Normal. Poor dentition. Neck: No JVD, no thyromegaly. Lungs: Clear to auscultation bilaterally with normal respiratory effort. CV:  Nondisplaced PMI.  Regular rate and rhythm, normal S1/S2, no S3/S4, II/VI holosystolic murmur along left sternal border. No pretibial or periankle edema. Abdomen: Soft, obese, no distention.  Neurologic: Alert and oriented x 3.  Psych: Normal affect. Skin: Normal. Musculoskeletal: Normal range of motion, no gross deformities. Extremities: No clubbing or cyanosis.   ECG: Most recent ECG reviewed.      ASSESSMENT AND PLAN: 1. Tachycardia/palpitations: I will initiate metoprolol 25 mg bid. Will have to monitor blood pressure to avoid hypotension.  2. Fatigue and SOB: Lexiscan Cardiolite stress test was normal, not revealing any evidence of occult ischemic heart disease. Sleep study results are pending. I will proceed with CT angiography of the chest given her history of multiple pulmonary emboli. If this is unrevealing, I may obtain right heart catheterization. 3. Pulmonary emboli: Continue warfarin. I will proceed with CT angiography of the chest given her history of multiple pulmonary emboli.  Dispo: f/u 2 months.  Kate Sable, M.D., F.A.C.C.

## 2014-05-15 ENCOUNTER — Ambulatory Visit (INDEPENDENT_AMBULATORY_CARE_PROVIDER_SITE_OTHER): Payer: Medicare HMO | Admitting: Family Medicine

## 2014-05-15 ENCOUNTER — Encounter: Payer: Self-pay | Admitting: Family Medicine

## 2014-05-15 VITALS — BP 118/70 | HR 90 | Temp 97.0°F | Ht 66.0 in | Wt 203.0 lb

## 2014-05-15 DIAGNOSIS — J069 Acute upper respiratory infection, unspecified: Secondary | ICD-10-CM

## 2014-05-15 MED ORDER — AMOXICILLIN 875 MG PO TABS: 875.0000 mg | ORAL_TABLET | Freq: Two times a day (BID) | ORAL | Status: DC

## 2014-05-15 MED ORDER — BENZONATATE 100 MG PO CAPS: 100.0000 mg | ORAL_CAPSULE | Freq: Three times a day (TID) | ORAL | Status: DC | PRN

## 2014-05-15 MED ORDER — METHYLPREDNISOLONE (PAK) 4 MG PO TABS: ORAL_TABLET | ORAL | Status: DC

## 2014-05-15 NOTE — Progress Notes (Signed)
   Subjective:    Patient ID: Kimberly Obrien, female    DOB: 1961/05/08, 53 y.o.   MRN: [id number]  HPI This 53 y.o. female presents for evaluation of uri and cough.  She is having wheezing and she Is using her SABA inhaler.   Review of Systems    No chest pain, SOB, HA, dizziness, vision change, N/V, diarrhea, constipation, dysuria, urinary urgency or frequency, myalgias, arthralgias or rash.  Objective:   Physical Exam Vital signs noted  Well developed well nourished female.  HEENT - Head atraumatic Normocephalic                Eyes - PERRLA, Conjuctiva - clear Sclera- Clear EOMI                Ears - EAC's Wnl TM's Wnl Gross Hearing WNL                Throat - oropharanx wnl Respiratory - Lungs CTA bilateral Cardiac - RRR S1 and S2 without murmur GI - Abdomen soft Nontender and bowel sounds active x 4 Extremities - No edema. Neuro - Grossly intact.       Assessment & Plan:  URI (upper respiratory infection) - Plan: amoxicillin (AMOXIL) 875 MG tablet, methylPREDNIsolone (MEDROL DOSPACK) 4 MG tablet, benzonatate (TESSALON PERLES) 100 MG capsule  Push po fluids, rest, tylenol and motrin otc prn as directed for fever, arthralgias, and myalgias.  Follow up prn if sx's continue or persist.  Lysbeth Penner FNP

## 2014-05-21 ENCOUNTER — Ambulatory Visit (INDEPENDENT_AMBULATORY_CARE_PROVIDER_SITE_OTHER): Payer: Medicare HMO | Admitting: Pharmacist

## 2014-05-21 ENCOUNTER — Encounter (INDEPENDENT_AMBULATORY_CARE_PROVIDER_SITE_OTHER): Payer: Self-pay

## 2014-05-21 DIAGNOSIS — I4891 Unspecified atrial fibrillation: Secondary | ICD-10-CM

## 2014-05-21 DIAGNOSIS — J811 Chronic pulmonary edema: Secondary | ICD-10-CM

## 2014-05-21 LAB — POCT INR: INR: 1.8

## 2014-05-21 NOTE — Patient Instructions (Signed)
Anticoagulation Dose Instructions as of 05/21/2014     Kimberly Obrien Tue Wed Thu Fri Sat   New Dose 2.5 mg 2.5 mg 2.5 mg 2.5 mg 2.5 mg 2.5 mg 2.5 mg    Description       Take 1 tablet today, then continue current warfarin dose of 1/2 tablet daily.       INR was 1.8 today

## 2014-06-09 ENCOUNTER — Telehealth: Payer: Self-pay | Admitting: *Deleted

## 2014-06-09 DIAGNOSIS — R0602 Shortness of breath: Secondary | ICD-10-CM

## 2014-06-09 NOTE — Telephone Encounter (Signed)
Appt with me can be postponed until 2016 given lack of CAD by coronary angiography and normal right heart pressures. Please make a pulmonary referral.

## 2014-06-09 NOTE — Telephone Encounter (Signed)
Patient questions if she should have follow up prior to 06/29/2014.  Recently had cath on 9/23.  Patient is also questioning if she needs Pulmonology referral, did not see this mentioned anywhere in the notes.

## 2014-06-19 NOTE — Telephone Encounter (Signed)
Patient notified.  Patient okay to go with [facility].    Please confirm what dx for referral (COPD, hx of PE) & when exactly should I put recall in for (3 or 6 months)?

## 2014-06-22 NOTE — Telephone Encounter (Signed)
Dx: SOB, h/o PE. Recall 6 months from now.

## 2014-06-23 ENCOUNTER — Ambulatory Visit (INDEPENDENT_AMBULATORY_CARE_PROVIDER_SITE_OTHER): Payer: Medicare HMO | Admitting: Pharmacist Clinician (PhC)/ Clinical Pharmacy Specialist

## 2014-06-23 DIAGNOSIS — I4891 Unspecified atrial fibrillation: Secondary | ICD-10-CM

## 2014-06-23 DIAGNOSIS — J811 Chronic pulmonary edema: Secondary | ICD-10-CM

## 2014-06-23 LAB — POCT INR: INR: 2.8

## 2014-06-23 NOTE — Telephone Encounter (Signed)
Referral entered & forwarded to [facility] for scheduling.  6 month recall put into EPIC.

## 2014-06-26 ENCOUNTER — Other Ambulatory Visit: Payer: Self-pay | Admitting: Pharmacist

## 2014-06-29 ENCOUNTER — Ambulatory Visit: Payer: Medicare HMO | Admitting: Cardiovascular Disease

## 2014-06-30 ENCOUNTER — Other Ambulatory Visit: Payer: Self-pay | Admitting: Family Medicine

## 2014-06-30 ENCOUNTER — Telehealth: Payer: Self-pay | Admitting: Family Medicine

## 2014-06-30 DIAGNOSIS — R0602 Shortness of breath: Secondary | ICD-10-CM

## 2014-06-30 NOTE — Telephone Encounter (Signed)
Please advise 

## 2014-07-02 ENCOUNTER — Institutional Professional Consult (permissible substitution): Payer: Medicare HMO | Admitting: Internal Medicine

## 2014-07-10 ENCOUNTER — Institutional Professional Consult (permissible substitution): Payer: Medicare HMO | Admitting: Internal Medicine

## 2014-07-30 ENCOUNTER — Encounter (HOSPITAL_COMMUNITY): Payer: Self-pay | Admitting: Cardiovascular Disease

## 2014-08-04 ENCOUNTER — Other Ambulatory Visit: Payer: Self-pay

## 2014-08-04 ENCOUNTER — Ambulatory Visit (INDEPENDENT_AMBULATORY_CARE_PROVIDER_SITE_OTHER): Payer: Medicare HMO | Admitting: Pharmacist Clinician (PhC)/ Clinical Pharmacy Specialist

## 2014-08-04 ENCOUNTER — Ambulatory Visit (INDEPENDENT_AMBULATORY_CARE_PROVIDER_SITE_OTHER): Payer: Medicare HMO

## 2014-08-04 ENCOUNTER — Other Ambulatory Visit: Payer: Self-pay | Admitting: Family Medicine

## 2014-08-04 ENCOUNTER — Telehealth: Payer: Self-pay | Admitting: Family Medicine

## 2014-08-04 DIAGNOSIS — J811 Chronic pulmonary edema: Secondary | ICD-10-CM

## 2014-08-04 DIAGNOSIS — R350 Frequency of micturition: Secondary | ICD-10-CM

## 2014-08-04 DIAGNOSIS — M25572 Pain in left ankle and joints of left foot: Secondary | ICD-10-CM

## 2014-08-04 DIAGNOSIS — I4891 Unspecified atrial fibrillation: Secondary | ICD-10-CM

## 2014-08-04 LAB — POCT URINALYSIS DIPSTICK
Bilirubin, UA: NEGATIVE
Glucose, UA: NEGATIVE
Ketones, UA: NEGATIVE
Nitrite, UA: NEGATIVE
Protein, UA: NEGATIVE
Spec Grav, UA: <=1.005
Urobilinogen, UA: NEGATIVE
pH, UA: 5

## 2014-08-04 LAB — POCT UA - MICROSCOPIC ONLY
Bacteria, U Microscopic: NEGATIVE
Casts, Ur, LPF, POC: NEGATIVE
Crystals, Ur, HPF, POC: NEGATIVE
Mucus, UA: NEGATIVE
Yeast, UA: NEGATIVE

## 2014-08-04 LAB — POCT INR: INR: 2.2

## 2014-08-04 MED ORDER — CIPROFLOXACIN HCL 500 MG PO TABS: 500.0000 mg | ORAL_TABLET | Freq: Two times a day (BID) | ORAL | Status: DC

## 2014-08-04 NOTE — Addendum Note (Signed)
Addended by: Zannie Cove on: 08/04/2014 05:48 PM   Modules accepted: Orders

## 2014-08-04 NOTE — Addendum Note (Signed)
Addended by: Thana Ates on: 08/04/2014 02:36 PM   Modules accepted: Orders

## 2014-08-04 NOTE — Addendum Note (Signed)
Addended by: Marin Olp on: 08/04/2014 02:00 PM   Modules accepted: Orders

## 2014-08-04 NOTE — Telephone Encounter (Signed)
Please advise 

## 2014-08-04 NOTE — Telephone Encounter (Signed)
Please call in prescription for Cipro 500 twice daily for 7 days and recheck urinalysis and 10-14 days

## 2014-08-05 LAB — URINE CULTURE

## 2014-08-05 NOTE — Progress Notes (Signed)
Referral to [facility] has been made per Dr Laurance Flatten

## 2014-08-05 NOTE — Telephone Encounter (Signed)
This was done yesterday.  

## 2014-08-06 ENCOUNTER — Telehealth: Payer: Self-pay

## 2014-08-06 NOTE — Telephone Encounter (Signed)
Pt aware of results 

## 2014-08-06 NOTE — Telephone Encounter (Signed)
-----   Message from Chipper Herb, MD sent at 08/05/2014  5:49 PM EST ----- There was minimal growth of mixed bacterial flora on the urine culture, however, the patient should continue and complete the antibiotic that was prescribed.

## 2014-08-07 ENCOUNTER — Ambulatory Visit (INDEPENDENT_AMBULATORY_CARE_PROVIDER_SITE_OTHER): Payer: Medicare HMO | Admitting: Pharmacist

## 2014-08-07 ENCOUNTER — Other Ambulatory Visit: Payer: Medicare HMO

## 2014-08-07 DIAGNOSIS — Z86711 Personal history of pulmonary embolism: Secondary | ICD-10-CM

## 2014-08-07 DIAGNOSIS — I4891 Unspecified atrial fibrillation: Secondary | ICD-10-CM

## 2014-08-07 DIAGNOSIS — J811 Chronic pulmonary edema: Secondary | ICD-10-CM

## 2014-08-07 LAB — POCT INR: INR: 2.6

## 2014-08-07 NOTE — Patient Instructions (Signed)
Anticoagulation Dose Instructions as of 08/07/2014      Dorene Grebe Tue Wed Thu Fri Sat   New Dose 2.5 mg 2.5 mg 2.5 mg 2.5 mg 2.5 mg 2.5 mg 2.5 mg    Description        Continue current warfarin dose of 1/2 tablet daily.       INR was 2.6 today

## 2014-08-07 NOTE — Progress Notes (Signed)
Lab only 

## 2014-08-18 ENCOUNTER — Ambulatory Visit: Payer: Medicare HMO | Admitting: Family Medicine

## 2014-08-25 ENCOUNTER — Other Ambulatory Visit (HOSPITAL_COMMUNITY): Payer: Self-pay | Admitting: Respiratory Therapy

## 2014-08-25 DIAGNOSIS — J441 Chronic obstructive pulmonary disease with (acute) exacerbation: Secondary | ICD-10-CM

## 2014-08-27 ENCOUNTER — Ambulatory Visit (HOSPITAL_COMMUNITY)
Admission: RE | Admit: 2014-08-27 | Discharge: 2014-08-27 | Disposition: A | Discharge status: Home / Self Care | Payer: Commercial Managed Care - HMO | Source: Ambulatory Visit | Attending: Pulmonary Disease | Admitting: Pulmonary Disease

## 2014-08-27 DIAGNOSIS — J441 Chronic obstructive pulmonary disease with (acute) exacerbation: Secondary | ICD-10-CM | POA: Insufficient documentation

## 2014-08-27 MED ORDER — ALBUTEROL SULFATE (2.5 MG/3ML) 0.083% IN NEBU
2.5000 mg | INHALATION_SOLUTION | Freq: Once | RESPIRATORY_TRACT | Status: AC
  Administered 2014-08-27: 2.5 mg via RESPIRATORY_TRACT

## 2014-09-06 LAB — PULMONARY FUNCTION TEST
DL/VA % pred: 70 %
DL/VA: 3.54 ml/min/mmHg/L
DLCO cor % pred: 53 %
DLCO cor: 14.29 ml/min/mmHg
DLCO unc % pred: 53 %
DLCO unc: 14.29 ml/min/mmHg
FEF 25-75 Post: 1.3 L/sec
FEF 25-75 Pre: 1.27 L/sec
FEF2575-%Change-Post: 2 %
FEF2575-%Pred-Post: 47 %
FEF2575-%Pred-Pre: 46 %
FEV1-%Change-Post: 0 %
FEV1-%Pred-Post: 65 %
FEV1-%Pred-Pre: 65 %
FEV1-Post: 1.89 L
FEV1-Pre: 1.89 L
FEV1FVC-%Change-Post: -2 %
FEV1FVC-%Pred-Pre: 89 %
FEV6-%Change-Post: 2 %
FEV6-%Pred-Post: 76 %
FEV6-%Pred-Pre: 74 %
FEV6-Post: 2.74 L
FEV6-Pre: 2.66 L
FEV6FVC-%Change-Post: 0 %
FEV6FVC-%Pred-Post: 102 %
FEV6FVC-%Pred-Pre: 103 %
FVC-%Change-Post: 2 %
FVC-%Pred-Post: 74 %
FVC-%Pred-Pre: 72 %
FVC-Post: 2.76 L
FVC-Pre: 2.68 L
Post FEV1/FVC ratio: 69 %
Post FEV6/FVC ratio: 99 %
Pre FEV1/FVC ratio: 71 %
Pre FEV6/FVC Ratio: 100 %
RV % pred: 115 %
RV: 2.27 L
TLC % pred: 92 %
TLC: 4.94 L

## 2014-09-15 DIAGNOSIS — H25812 Combined forms of age-related cataract, left eye: Secondary | ICD-10-CM | POA: Diagnosis not present

## 2014-09-16 ENCOUNTER — Encounter: Payer: Self-pay | Admitting: Family

## 2014-09-16 ENCOUNTER — Ambulatory Visit (INDEPENDENT_AMBULATORY_CARE_PROVIDER_SITE_OTHER): Payer: Commercial Managed Care - HMO | Admitting: Family

## 2014-09-16 VITALS — BP 118/72 | HR 73 | Temp 97.0°F | Ht 66.0 in | Wt 202.8 lb

## 2014-09-16 DIAGNOSIS — E559 Vitamin D deficiency, unspecified: Secondary | ICD-10-CM | POA: Diagnosis not present

## 2014-09-16 DIAGNOSIS — I482 Chronic atrial fibrillation, unspecified: Secondary | ICD-10-CM

## 2014-09-16 DIAGNOSIS — E785 Hyperlipidemia, unspecified: Secondary | ICD-10-CM | POA: Diagnosis not present

## 2014-09-16 DIAGNOSIS — R300 Dysuria: Secondary | ICD-10-CM

## 2014-09-16 DIAGNOSIS — Z0000 Encounter for general adult medical examination without abnormal findings: Secondary | ICD-10-CM

## 2014-09-16 DIAGNOSIS — F411 Generalized anxiety disorder: Secondary | ICD-10-CM

## 2014-09-16 DIAGNOSIS — J811 Chronic pulmonary edema: Secondary | ICD-10-CM

## 2014-09-16 DIAGNOSIS — Z86711 Personal history of pulmonary embolism: Secondary | ICD-10-CM

## 2014-09-16 DIAGNOSIS — Z01419 Encounter for gynecological examination (general) (routine) without abnormal findings: Secondary | ICD-10-CM | POA: Diagnosis not present

## 2014-09-16 DIAGNOSIS — I4891 Unspecified atrial fibrillation: Secondary | ICD-10-CM

## 2014-09-16 LAB — POCT URINALYSIS DIPSTICK
Bilirubin, UA: NEGATIVE
Glucose, UA: NEGATIVE
Ketones, UA: NEGATIVE
Nitrite, UA: NEGATIVE
Spec Grav, UA: >=1.03
Urobilinogen, UA: NEGATIVE
pH, UA: 5

## 2014-09-16 LAB — POCT UA - MICROSCOPIC ONLY
Casts, Ur, LPF, POC: NEGATIVE
Crystals, Ur, HPF, POC: NEGATIVE
Mucus, UA: NEGATIVE
Yeast, UA: NEGATIVE

## 2014-09-16 LAB — POCT INR: INR: 2.2

## 2014-09-16 NOTE — Patient Instructions (Signed)

## 2014-09-16 NOTE — Progress Notes (Signed)
Subjective:    Patient ID: Kimberly Obrien, female    DOB: 1961/07/21, 54 y.o.   MRN: [id number]  Pt presents for annual physical with pap. Pt states she has a hx of hyperlipidemia, but states the statins made her pancreases tender and enlarge and her blood sugar to increase. Pt states she does not want to take anything for her cholesterol.  Pt has a hx of 2 PE and is currently taking warfarin.  She also has mild COPD and see's a pulmonologist   who is managing this.    Gynecologic Exam Pertinent negatives include no headaches.      Review of Systems  Constitutional: Negative.   HENT: Negative.   Eyes: Negative.   Respiratory: Negative.  Negative for shortness of breath.   Cardiovascular: Negative.  Negative for palpitations.  Gastrointestinal: Negative.   Endocrine: Negative.   Genitourinary: Negative.   Musculoskeletal: Negative.   Neurological: Negative.  Negative for headaches.  Hematological: Negative.   Psychiatric/Behavioral: Negative.   All other systems reviewed and are negative.      Objective:   Physical Exam  Constitutional: She is oriented to person, place, and time. She appears well-developed and well-nourished. No distress.  HENT:  Head: Normocephalic and atraumatic.  Right Ear: External ear normal.  Mouth/Throat: Oropharynx is clear and moist.  Eyes: Pupils are equal, round, and reactive to light.  Neck: Normal range of motion. Neck supple. No thyromegaly present.  Cardiovascular: Normal rate, regular rhythm, normal heart sounds and intact distal pulses.   No murmur heard. Pulmonary/Chest: Effort normal and breath sounds normal. No respiratory distress. She has no wheezes. Right breast exhibits no inverted nipple, no mass, no nipple discharge, no skin change and no tenderness. Left breast exhibits no inverted nipple, no mass, no nipple discharge, no skin change and no tenderness. Breasts are symmetrical.  Abdominal: Soft. Bowel sounds are normal. She  exhibits no distension. There is no tenderness.  Genitourinary: Vagina normal.  Bimanual exam- no adnexal masses or tenderness, ovaries nonpalpable   Cervix parous and pink- No discharge  Pt had partial hysterectomy with cervix intact   Musculoskeletal: Normal range of motion. She exhibits no edema or tenderness.  Neurological: She is alert and oriented to person, place, and time. She has normal reflexes. No cranial nerve deficit.  Skin: Skin is warm and dry.  Psychiatric: She has a normal mood and affect. Her behavior is normal. Judgment and thought content normal.  Vitals reviewed.     BP 118/72 mmHg  Pulse 73  Temp(Src) 97 F (36.1 C) (Oral)  Ht '5\' 6"'  (1.676 m)  Wt 202 lb 12.8 oz (91.989 kg)  BMI 32.75 kg/m2     Assessment & Plan:  1. Atrial fibrillation - POCT INR - CMP14+EGFR  2. Annual physical exam - POCT urinalysis dipstick - POCT UA - Microscopic Only - CMP14+EGFR - Vit D  25 hydroxy (rtn osteoporosis monitoring) - Thyroid Panel With TSH - Lipid panel - Pap IG w/ reflex to HPV when ASC-U  3. Vitamin D deficiency - CMP14+EGFR - Vit D  25 hydroxy (rtn osteoporosis monitoring)  4. HLD (hyperlipidemia) - CMP14+EGFR - Lipid panel  5. History of pulmonary embolism - CMP14+EGFR  6. GAD (generalized anxiety disorder) - CMP14+EGFR  7. Chronic pulmonary edema - CMP14+EGFR  8. Encounter for routine gynecological examination - Pap IG w/ reflex to HPV when ASC-U   Continue all meds Labs pending Health Maintenance reviewed- hemoccult cards given to patient with directions  Diet and exercise encouraged RTO 1 year  Evelina Dun, FNP

## 2014-09-16 NOTE — Addendum Note (Signed)
Addended by: Pollyann Kennedy F on: 09/16/2014 12:18 PM   Modules accepted: Orders

## 2014-09-17 LAB — THYROID PANEL WITH TSH
Free Thyroxine Index: 2.6 (ref 1.2–4.9)
T3 Uptake Ratio: 26 % (ref 24–39)
T4, Total: 10.1 ug/dL (ref 4.5–12.0)
TSH: 1.16 u[IU]/mL (ref 0.450–4.500)

## 2014-09-17 LAB — CMP14+EGFR
ALT: 107 IU/L — ABNORMAL HIGH (ref 0–32)
AST: 80 IU/L — ABNORMAL HIGH (ref 0–40)
Albumin/Globulin Ratio: 1.8 (ref 1.1–2.5)
Albumin: 4.2 g/dL (ref 3.5–5.5)
Alkaline Phosphatase: 88 IU/L (ref 39–117)
BUN/Creatinine Ratio: 12 (ref 9–23)
BUN: 9 mg/dL (ref 6–24)
CO2: 25 mmol/L (ref 18–29)
Calcium: 9.2 mg/dL (ref 8.7–10.2)
Chloride: 102 mmol/L (ref 97–108)
Creatinine, Ser: 0.75 mg/dL (ref 0.57–1.00)
GFR calc Af Amer: 105 mL/min/{1.73_m2} (ref >59)
GFR calc non Af Amer: 91 mL/min/{1.73_m2} (ref >59)
Globulin, Total: 2.4 g/dL (ref 1.5–4.5)
Glucose: 91 mg/dL (ref 65–99)
Potassium: 3.8 mmol/L (ref 3.5–5.2)
Sodium: 141 mmol/L (ref 134–144)
Total Bilirubin: 1 mg/dL (ref 0.0–1.2)
Total Protein: 6.6 g/dL (ref 6.0–8.5)

## 2014-09-17 LAB — LIPID PANEL
Chol/HDL Ratio: 4.4 ratio units (ref 0.0–4.4)
Cholesterol, Total: 184 mg/dL (ref 100–199)
HDL: 42 mg/dL (ref >39)
LDL Calculated: 118 mg/dL — ABNORMAL HIGH (ref 0–99)
Triglycerides: 119 mg/dL (ref 0–149)
VLDL Cholesterol Cal: 24 mg/dL (ref 5–40)

## 2014-09-17 LAB — URINE CULTURE

## 2014-09-17 LAB — VITAMIN D 25 HYDROXY (VIT D DEFICIENCY, FRACTURES): Vit D, 25-Hydroxy: 25.9 ng/mL — ABNORMAL LOW (ref 30.0–100.0)

## 2014-09-18 ENCOUNTER — Other Ambulatory Visit: Payer: Self-pay | Admitting: Family

## 2014-09-18 DIAGNOSIS — R748 Abnormal levels of other serum enzymes: Secondary | ICD-10-CM

## 2014-09-18 LAB — PAP IG W/ RFLX HPV ASCU: PAP Smear Comment: 0

## 2014-09-30 DIAGNOSIS — J449 Chronic obstructive pulmonary disease, unspecified: Secondary | ICD-10-CM | POA: Diagnosis not present

## 2014-09-30 DIAGNOSIS — I4891 Unspecified atrial fibrillation: Secondary | ICD-10-CM | POA: Diagnosis not present

## 2014-10-19 ENCOUNTER — Ambulatory Visit (INDEPENDENT_AMBULATORY_CARE_PROVIDER_SITE_OTHER): Payer: Commercial Managed Care - HMO | Admitting: Pharmacist

## 2014-10-19 ENCOUNTER — Encounter (INDEPENDENT_AMBULATORY_CARE_PROVIDER_SITE_OTHER): Payer: Self-pay

## 2014-10-19 DIAGNOSIS — J811 Chronic pulmonary edema: Secondary | ICD-10-CM | POA: Diagnosis not present

## 2014-10-19 DIAGNOSIS — I4891 Unspecified atrial fibrillation: Secondary | ICD-10-CM | POA: Diagnosis not present

## 2014-10-19 LAB — POCT INR: INR: 2.2

## 2014-10-19 NOTE — Patient Instructions (Signed)
Anticoagulation Dose Instructions as of 10/19/2014      Dorene Grebe Tue Wed Thu Fri Sat   New Dose 2.5 mg 2.5 mg 2.5 mg 2.5 mg 2.5 mg 2.5 mg 2.5 mg    Description        Continue current warfarin dose of 1/2 tablet daily.      INR was 2.2 today

## 2014-10-21 DIAGNOSIS — R748 Abnormal levels of other serum enzymes: Secondary | ICD-10-CM | POA: Diagnosis not present

## 2014-10-26 DIAGNOSIS — H25812 Combined forms of age-related cataract, left eye: Secondary | ICD-10-CM | POA: Diagnosis not present

## 2014-10-26 DIAGNOSIS — H2512 Age-related nuclear cataract, left eye: Secondary | ICD-10-CM | POA: Diagnosis not present

## 2014-10-27 DIAGNOSIS — R748 Abnormal levels of other serum enzymes: Secondary | ICD-10-CM | POA: Diagnosis not present

## 2014-11-02 ENCOUNTER — Telehealth: Payer: Self-pay | Admitting: Family

## 2014-11-02 NOTE — Telephone Encounter (Signed)
Patient started on prednisone 10/30/14 for 3 weeks.   Advised her to hold warfarin today as prednisone can increase INR.   She is given appt for 11/05/14 to recheck INR.

## 2014-11-03 ENCOUNTER — Ambulatory Visit: Payer: Self-pay | Admitting: Family Medicine

## 2014-11-05 ENCOUNTER — Ambulatory Visit (INDEPENDENT_AMBULATORY_CARE_PROVIDER_SITE_OTHER): Payer: Commercial Managed Care - HMO | Admitting: Pharmacist

## 2014-11-05 DIAGNOSIS — J811 Chronic pulmonary edema: Secondary | ICD-10-CM

## 2014-11-05 DIAGNOSIS — I4891 Unspecified atrial fibrillation: Secondary | ICD-10-CM

## 2014-11-05 LAB — POCT INR: INR: 1.2

## 2014-11-05 NOTE — Patient Instructions (Signed)
Anticoagulation Dose Instructions as of 11/05/2014      Kimberly Obrien Tue Wed Thu Fri Sat   New Dose 2.5 mg 2.5 mg 2.5 mg 2.5 mg 2.5 mg 2.5 mg 2.5 mg    Description        Take 1 tablet today and tomorrow then restart warfarin 5mg  - take 1/2 tablet daily.      INR was 1.2 today

## 2014-11-16 ENCOUNTER — Ambulatory Visit (INDEPENDENT_AMBULATORY_CARE_PROVIDER_SITE_OTHER): Payer: Commercial Managed Care - HMO | Admitting: Pharmacist

## 2014-11-16 DIAGNOSIS — I4891 Unspecified atrial fibrillation: Secondary | ICD-10-CM | POA: Diagnosis not present

## 2014-11-16 DIAGNOSIS — J811 Chronic pulmonary edema: Secondary | ICD-10-CM

## 2014-11-16 LAB — POCT INR: INR: 1.7

## 2014-11-16 NOTE — Patient Instructions (Signed)
Anticoagulation Dose Instructions as of 11/16/2014      Kimberly Obrien Tue Wed Thu Fri Sat   New Dose 2.5 mg 2.5 mg 2.5 mg 2.5 mg 2.5 mg 2.5 mg 2.5 mg    Description        Take 1 tablet today - Monday, March 28th, then restart warfarin 5mg  - take 1/2 tablet daily.       INR was 1.7 today.

## 2014-11-17 ENCOUNTER — Other Ambulatory Visit: Payer: Self-pay | Admitting: Gastroenterology

## 2014-11-17 DIAGNOSIS — R748 Abnormal levels of other serum enzymes: Secondary | ICD-10-CM | POA: Diagnosis not present

## 2014-11-18 ENCOUNTER — Other Ambulatory Visit: Payer: Self-pay | Admitting: Gastroenterology

## 2014-11-18 DIAGNOSIS — R748 Abnormal levels of other serum enzymes: Secondary | ICD-10-CM

## 2014-11-23 ENCOUNTER — Telehealth: Payer: Self-pay | Admitting: Pharmacist

## 2014-11-23 ENCOUNTER — Ambulatory Visit
Admission: RE | Admit: 2014-11-23 | Discharge: 2014-11-23 | Disposition: A | Discharge status: Home / Self Care | Payer: Commercial Managed Care - HMO | Source: Ambulatory Visit | Attending: Gastroenterology | Admitting: Gastroenterology

## 2014-11-23 DIAGNOSIS — R161 Splenomegaly, not elsewhere classified: Secondary | ICD-10-CM | POA: Diagnosis not present

## 2014-11-23 DIAGNOSIS — Z9049 Acquired absence of other specified parts of digestive tract: Secondary | ICD-10-CM | POA: Diagnosis not present

## 2014-11-23 DIAGNOSIS — R748 Abnormal levels of other serum enzymes: Secondary | ICD-10-CM

## 2014-11-23 NOTE — Telephone Encounter (Signed)
Continue prednisone and current warfarin dose - recheck planned 12/03/2014.

## 2014-11-26 ENCOUNTER — Other Ambulatory Visit: Payer: Self-pay

## 2014-12-03 ENCOUNTER — Ambulatory Visit (INDEPENDENT_AMBULATORY_CARE_PROVIDER_SITE_OTHER): Payer: Commercial Managed Care - HMO | Admitting: Pharmacist

## 2014-12-03 VITALS — Wt 206.0 lb

## 2014-12-03 DIAGNOSIS — J811 Chronic pulmonary edema: Secondary | ICD-10-CM | POA: Diagnosis not present

## 2014-12-03 DIAGNOSIS — I4891 Unspecified atrial fibrillation: Secondary | ICD-10-CM

## 2014-12-03 LAB — POCT INR: INR: 2.3

## 2014-12-03 NOTE — Patient Instructions (Signed)
Anticoagulation Dose Instructions as of 12/03/2014      Kimberly Obrien Tue Wed Thu Fri Sat   New Dose 2.5 mg 2.5 mg 2.5 mg 2.5 mg 2.5 mg 2.5 mg 2.5 mg    Description        Continue warfarin 5mg  - take 1/2 tablet daily.       INR was 2.3 today

## 2014-12-10 ENCOUNTER — Other Ambulatory Visit: Payer: Commercial Managed Care - HMO

## 2014-12-10 ENCOUNTER — Other Ambulatory Visit: Payer: Self-pay

## 2014-12-10 DIAGNOSIS — Z1212 Encounter for screening for malignant neoplasm of rectum: Secondary | ICD-10-CM

## 2014-12-10 NOTE — Progress Notes (Signed)
Lab only 

## 2014-12-11 DIAGNOSIS — G4730 Sleep apnea, unspecified: Secondary | ICD-10-CM | POA: Diagnosis not present

## 2014-12-11 DIAGNOSIS — K751 Phlebitis of portal vein: Secondary | ICD-10-CM | POA: Diagnosis not present

## 2014-12-11 DIAGNOSIS — J45909 Unspecified asthma, uncomplicated: Secondary | ICD-10-CM | POA: Diagnosis not present

## 2014-12-11 DIAGNOSIS — I4891 Unspecified atrial fibrillation: Secondary | ICD-10-CM | POA: Diagnosis not present

## 2014-12-12 LAB — FECAL OCCULT BLOOD, IMMUNOCHEMICAL: Fecal Occult Bld: POSITIVE — AB

## 2014-12-15 DIAGNOSIS — R799 Abnormal finding of blood chemistry, unspecified: Secondary | ICD-10-CM | POA: Diagnosis not present

## 2014-12-15 DIAGNOSIS — K589 Irritable bowel syndrome without diarrhea: Secondary | ICD-10-CM | POA: Diagnosis not present

## 2014-12-17 ENCOUNTER — Telehealth: Payer: Self-pay | Admitting: Pharmacist

## 2014-12-17 NOTE — Telephone Encounter (Signed)
Positive hemocult. Recommend recheck and also check CBC - patient already has appt for 12/21/14 Patient notified by phone.

## 2014-12-18 ENCOUNTER — Ambulatory Visit (HOSPITAL_COMMUNITY): Payer: Commercial Managed Care - HMO | Attending: Pulmonary Disease

## 2014-12-18 DIAGNOSIS — R0602 Shortness of breath: Secondary | ICD-10-CM | POA: Diagnosis not present

## 2014-12-21 ENCOUNTER — Other Ambulatory Visit: Payer: Self-pay | Admitting: Family Medicine

## 2014-12-21 ENCOUNTER — Encounter: Payer: Self-pay | Admitting: Family Medicine

## 2014-12-21 ENCOUNTER — Ambulatory Visit (INDEPENDENT_AMBULATORY_CARE_PROVIDER_SITE_OTHER): Payer: Commercial Managed Care - HMO | Admitting: Family Medicine

## 2014-12-21 VITALS — BP 123/73 | HR 73 | Temp 98.3°F | Ht 66.0 in | Wt 206.8 lb

## 2014-12-21 DIAGNOSIS — E785 Hyperlipidemia, unspecified: Secondary | ICD-10-CM | POA: Diagnosis not present

## 2014-12-21 DIAGNOSIS — R5383 Other fatigue: Secondary | ICD-10-CM

## 2014-12-21 DIAGNOSIS — I4891 Unspecified atrial fibrillation: Secondary | ICD-10-CM

## 2014-12-21 DIAGNOSIS — J811 Chronic pulmonary edema: Secondary | ICD-10-CM | POA: Diagnosis not present

## 2014-12-21 DIAGNOSIS — E559 Vitamin D deficiency, unspecified: Secondary | ICD-10-CM

## 2014-12-21 LAB — POCT CBC
Granulocyte percent: 87.7 %G — AB (ref 37–80)
HCT, POC: 47.7 % (ref 37.7–47.9)
Hemoglobin: 15.1 g/dL (ref 12.2–16.2)
Lymph, poc: 1.4 (ref 0.6–3.4)
MCH, POC: 27.7 pg (ref 27–31.2)
MCHC: 31.7 g/dL — AB (ref 31.8–35.4)
MCV: 87.3 fL (ref 80–97)
MPV: 8.3 fL (ref 0–99.8)
POC Granulocyte: 11.5 — AB (ref 2–6.9)
POC LYMPH PERCENT: 10.4 %L (ref 10–50)
Platelet Count, POC: 194 10*3/uL (ref 142–424)
RBC: 5.46 M/uL (ref 4.04–5.48)
RDW, POC: 14.6 %
WBC: 13.1 10*3/uL — AB (ref 4.6–10.2)

## 2014-12-21 LAB — POCT INR: INR: 1.7

## 2014-12-21 MED ORDER — BUDESONIDE 180 MCG/ACT IN AEPB: 2.0000 | INHALATION_SPRAY | Freq: Two times a day (BID) | RESPIRATORY_TRACT | Status: DC

## 2014-12-21 NOTE — Progress Notes (Addendum)
Subjective:  Patient ID: Kimberly Obrien, female    DOB: 1961-01-09  Age: 54 y.o. 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 Mild COPD. Dyspnea for 1 year. Has used multiple inhalers. Having palpitations and then breath cuts off with exertion. She does not feel panic, anxiety or dysphoric mood. Recent Heart cath nml. Had normal MRA of brain after noting that palpitations caused a rush of pain into the head. Walking up a set of stairs or any incline. Med for heart rate didn't help either. Repeat lung scan for DOE done within the last few months was normal. Cardiorespiratory stress test performed 3 days ago. Suggestion of atrial fibrillation noted. Final result remains pending   History Kimberly Obrien has a past medical history of Anticoagulation monitoring by pharmacist; PE (pulmonary embolism); Arthritis; COPD (chronic obstructive pulmonary disease); and Asthma.   She has past surgical history that includes Tubal ligation; Wrist surgery (Right); Cholecystectomy; Spine surgery; Abdominal hysterectomy; Eye surgery; and left and right heart catheterization with coronary angiogram (N/A, 05/13/2014).   Her family history includes Cirrhosis in her mother; Hyperlipidemia in her father.She reports that she quit smoking about 2 years ago. Her smoking use included Cigarettes. She started smoking about 37 years ago. She has a 68 pack-year smoking history. She has never used smokeless tobacco. She reports that she does not drink alcohol or use illicit drugs.  Current Outpatient Prescriptions on File Prior to Visit  Medication Sig Dispense Refill  . warfarin (COUMADIN) 5 MG tablet Take 2.5 mg by mouth daily.    . Vitamin D, Ergocalciferol, (DRISDOL) 50000 UNITS CAPS capsule Take 50,000 Units by mouth every Tuesday.     No current facility-administered medications on file prior to visit.     ROS Review of Systems  Constitutional: Negative for fever, chills, diaphoresis, appetite change, fatigue and unexpected weight change.  HENT: Negative for congestion, ear pain, hearing loss, postnasal drip, rhinorrhea, sneezing, sore throat and trouble swallowing.   Eyes: Negative for pain.  Respiratory: Positive for shortness of breath. Negative for cough, chest tightness and wheezing.   Cardiovascular: Positive for palpitations. Negative for chest pain.  Gastrointestinal: Negative for nausea, vomiting, abdominal pain, diarrhea and constipation.  Genitourinary: Negative for dysuria, frequency and menstrual problem.  Musculoskeletal: Negative for joint swelling and arthralgias.  Skin: Negative for rash.  Neurological: Negative for dizziness, weakness, numbness and headaches.  Psychiatric/Behavioral: Negative for dysphoric mood and agitation.    Objective:  BP 123/73 mmHg  Pulse 73  Temp(Src) 98.3 F (36.8 C) (Oral)  Ht '5\' 6"'  (1.676 m)  Wt 206 lb 12.8 oz (93.804 kg)  BMI 33.39 kg/m2  BP Readings from Last 3 Encounters:  12/21/14 123/73  09/16/14 118/72  05/15/14 118/70    Wt Readings from Last 3 Encounters:  12/21/14 206 lb 12.8 oz (93.804 kg)  12/03/14 206 lb (93.441 kg)  09/16/14 202 lb 12.8 oz (91.989 kg)     Physical Exam  Constitutional: She is oriented to person, place, and time. She appears well-developed and well-nourished. No distress.  HENT:  Head: Normocephalic and atraumatic.  Right Ear: External ear normal.  Left Ear: External ear normal.  Nose: Nose normal.  Mouth/Throat: Oropharynx is clear and moist.  Eyes: Conjunctivae and EOM are normal. Pupils are equal, round, and reactive to light.  Neck: Normal range of motion. Neck supple. No thyromegaly present.  Cardiovascular:  Normal rate, regular rhythm and normal heart sounds.   No murmur heard. Pulmonary/Chest: Effort normal and breath sounds normal. No respiratory distress. She has no wheezes. She  has no rales.  Abdominal: Soft. Bowel sounds are normal. She exhibits no distension. There is no tenderness.  Lymphadenopathy:    She has no cervical adenopathy.  Neurological: She is alert and oriented to person, place, and time. She has normal reflexes.  Skin: Skin is warm and dry.  Psychiatric: She has a normal mood and affect. Her behavior is normal. Judgment and thought content normal.    No results found for: HGBA1C  Lab Results  Component Value Date   WBC 13.1* 12/21/2014   HGB 15.1 12/21/2014   HCT 47.7 12/21/2014   PLT 161 05/13/2014   GLUCOSE 91 09/16/2014   CHOL 184 09/16/2014   TRIG 119 09/16/2014   HDL 42 09/16/2014   LDLCALC 118* 09/16/2014   ALT 107* 09/16/2014   AST 80* 09/16/2014   NA 141 09/16/2014   K 3.8 09/16/2014   CL 102 09/16/2014   CREATININE 0.75 09/16/2014   BUN 9 09/16/2014   CO2 25 09/16/2014   TSH 1.160 09/16/2014   INR 1.7 12/21/2014    US Abdomen Complete  11/23/2014   CLINICAL DATA:  54 year old female with right upper quadrant pain and elevated alk phos. Previous cholecystectomy. Initial encounter.  EXAM: ULTRASOUND ABDOMEN COMPLETE  COMPARISON:  Chest CTA 04/30/2014. Lumbar spine CT myelogram 07/25/2006.  FINDINGS: Gallbladder: Surgically absent  Common bile duct: Diameter: 7 mm, within normal limits for the post cholecystectomy state  Liver: Echogenicity at the upper limits of normal to mildly increased. No intrahepatic biliary ductal dilatation. No discrete liver lesion.  IVC: No abnormality visualized.  Pancreas: Visualized portion unremarkable.  Spleen: Upper limits of normal to mildly enlarged, splenic length 13-14 cm. Echotexture within normal limits. No discrete splenic lesion.  Right Kidney: Length: 11.5 cm. Echogenicity within normal limits. No mass or hydronephrosis visualized.  Left Kidney: Length: 11.4 cm. Normal cortical echogenicity. Mild prominence of the left renal pelvis without strong evidence of hydronephrosis.  Abdominal aorta:  No aneurysm visualized.  Other findings: None.  IMPRESSION: 1. Liver echogenicity suggests mild fatty infiltration compatible with steatosis. 2. Surgically absent gallbladder with no evidence of biliary obstruction. 3. Mild nonspecific splenomegaly.   Electronically Signed   By: Genevie Ann M.D.   On: 11/23/2014 08:44    Assessment & Plan:   Ieshia was seen today for shortness of breath.  Diagnoses and all orders for this visit:  Vitamin D deficiency Orders: -     Cancel: POCT CBC; Standing -     Vit D  25 hydroxy (rtn osteoporosis monitoring); Standing -     POCT CBC -     POCT CBC -     Vit D  25 hydroxy (rtn osteoporosis monitoring)  Other fatigue Orders: -     Cancel: POCT CBC; Standing -     CMP14+EGFR; Standing -     Cancel: Thyroid Panel With TSH -     POCT CBC -     POCT CBC -     CMP14+EGFR  HLD (hyperlipidemia) Orders: -     CMP14+EGFR; Standing -     Lipid panel; Standing -     NMR, lipoprofile; Standing -     CMP14+EGFR -     Lipid panel  Atrial fibrillation, unspecified Orders: -     POCT INR  Other orders -  budesonide (PULMICORT FLEXHALER) 180 MCG/ACT inhaler; Inhale 2 puffs into the lungs 2 (two) times daily.   I have discontinued Ms. Raulston's albuterol and Umeclidinium-Vilanterol. I am also having her start on budesonide. Additionally, I am having her maintain her warfarin and Vitamin D (Ergocalciferol).  Meds ordered this encounter  Medications  . budesonide (PULMICORT FLEXHALER) 180 MCG/ACT inhaler    Sig: Inhale 2 puffs into the lungs 2 (two) times daily.    Dispense:  1 Inhaler    Refill:  11   Avoid using beta agonists due to palpitations. The preliminary report was reviewed from the cardiopulmonary stress test where awaiting the final interpretation. She already uses Coumadin due to her history of multiple pulmonary emboli. And she has enough respiratory component that these beta blocker for the time being remains questionable. She may be  an excellent candidate for an ablation procedure. In that case referral to electrophysiology cardiology specialist will be made  Follow-up: Return in about 3 weeks (around 01/11/2015) for palpitations and dyspnea.  Claretta Fraise, M.D.

## 2014-12-21 NOTE — Addendum Note (Signed)
Addended by: Claretta Fraise on: 12/21/2014 05:30 PM   Modules accepted: Orders, Medications

## 2014-12-21 NOTE — Addendum Note (Signed)
Addended by: Pollyann Kennedy F on: 12/21/2014 05:44 PM   Modules accepted: Orders

## 2014-12-21 NOTE — Addendum Note (Signed)
Addended by: Pollyann Kennedy F on: 12/21/2014 05:43 PM   Modules accepted: Orders

## 2014-12-22 LAB — NMR, LIPOPROFILE
Cholesterol: 206 mg/dL — ABNORMAL HIGH (ref 100–199)
HDL Cholesterol by NMR: 60 mg/dL (ref >39)
HDL Particle Number: 29.4 umol/L — ABNORMAL LOW (ref >=30.5)
LDL Particle Number: 1310 nmol/L — ABNORMAL HIGH (ref <1000)
LDL Size: 21.5 nm (ref >20.5)
LDL-C: 124 mg/dL — ABNORMAL HIGH (ref 0–99)
LP-IR Score: <25 (ref <=45)
Small LDL Particle Number: 252 nmol/L (ref <=527)
Triglycerides by NMR: 110 mg/dL (ref 0–149)

## 2014-12-23 LAB — VITAMIN D 25 HYDROXY (VIT D DEFICIENCY, FRACTURES): Vit D, 25-Hydroxy: 21 ng/mL — ABNORMAL LOW (ref 30.0–100.0)

## 2014-12-23 LAB — CMP14+EGFR
ALT: 60 IU/L — ABNORMAL HIGH (ref 0–32)
AST: 37 IU/L (ref 0–40)
Albumin/Globulin Ratio: 2 (ref 1.1–2.5)
Albumin: 4.3 g/dL (ref 3.5–5.5)
Alkaline Phosphatase: 71 IU/L (ref 39–117)
BUN/Creatinine Ratio: 13 (ref 9–23)
BUN: 10 mg/dL (ref 6–24)
Bilirubin Total: 0.8 mg/dL (ref 0.0–1.2)
CO2: 24 mmol/L (ref 18–29)
Calcium: 9.4 mg/dL (ref 8.7–10.2)
Chloride: 102 mmol/L (ref 97–108)
Creatinine, Ser: 0.75 mg/dL (ref 0.57–1.00)
GFR calc Af Amer: 105 mL/min/{1.73_m2} (ref >59)
GFR calc non Af Amer: 91 mL/min/{1.73_m2} (ref >59)
Globulin, Total: 2.1 g/dL (ref 1.5–4.5)
Glucose: 125 mg/dL — ABNORMAL HIGH (ref 65–99)
Potassium: 4.1 mmol/L (ref 3.5–5.2)
Sodium: 141 mmol/L (ref 134–144)
Total Protein: 6.4 g/dL (ref 6.0–8.5)

## 2014-12-23 LAB — LIPID PANEL
Chol/HDL Ratio: 3.3 ratio units (ref 0.0–4.4)
Cholesterol, Total: 203 mg/dL — ABNORMAL HIGH (ref 100–199)
HDL: 61 mg/dL (ref >39)
LDL Calculated: 120 mg/dL — ABNORMAL HIGH (ref 0–99)
Triglycerides: 109 mg/dL (ref 0–149)
VLDL Cholesterol Cal: 22 mg/dL (ref 5–40)

## 2014-12-24 ENCOUNTER — Other Ambulatory Visit: Payer: Self-pay | Admitting: Family Medicine

## 2014-12-24 MED ORDER — EZETIMIBE 10 MG PO TABS: 10.0000 mg | ORAL_TABLET | Freq: Every day | ORAL | Status: DC

## 2014-12-24 MED ORDER — VITAMIN D (ERGOCALCIFEROL) 1.25 MG (50000 UNIT) PO CAPS: 50000.0000 [IU] | ORAL_CAPSULE | ORAL | Status: DC

## 2014-12-25 ENCOUNTER — Other Ambulatory Visit: Payer: Self-pay

## 2014-12-25 ENCOUNTER — Other Ambulatory Visit: Payer: Commercial Managed Care - HMO

## 2014-12-25 ENCOUNTER — Telehealth: Payer: Self-pay | Admitting: *Deleted

## 2014-12-25 DIAGNOSIS — R739 Hyperglycemia, unspecified: Secondary | ICD-10-CM

## 2014-12-25 NOTE — Telephone Encounter (Signed)
-----   Message from Claretta Fraise, MD sent at 12/24/2014  6:20 PM EDT ----- Your cholesterol is too high. It would benefit from medication. Without treatment, risk for heart attack, stroke and other medical conditions is high. I recommend Zetia 10 mg daily with supper should reduce your risk significantly.   Vitamin D quite low. Needs supplement.Use 50,000 units twice weekly for two months. Then switch to OTC Vitamin D 50,000 units once daily as previous. Lips recheck her level in about 6 months.  If the patient was fasting for this blood test she will need an hemoglobin A1c drawn. If she was nonfasting blood glucose should be repeated in a few days as a fasting test. At that time a reflex to hemoglobin A1c should be performed if the glucose is again as high as 125.

## 2014-12-25 NOTE — Addendum Note (Signed)
Addended by: Ilean China on: 12/25/2014 03:48 PM   Modules accepted: Orders

## 2014-12-25 NOTE — Telephone Encounter (Signed)
Left message that results are available on my chart and to call back to discuss recommendations.

## 2014-12-25 NOTE — Telephone Encounter (Signed)
See result note.  

## 2014-12-28 ENCOUNTER — Telehealth (HOSPITAL_COMMUNITY): Payer: Self-pay | Admitting: Vascular Surgery

## 2014-12-28 NOTE — Telephone Encounter (Signed)
Dr. Luan Pulling office called they will like cpx results form 4/29 faxed [phone]

## 2014-12-29 ENCOUNTER — Ambulatory Visit: Payer: Self-pay | Admitting: Family Medicine

## 2014-12-29 ENCOUNTER — Telehealth: Payer: Self-pay

## 2014-12-29 MED ORDER — MOMETASONE FUROATE 220 MCG/INH IN AEPB: 2.0000 | INHALATION_SPRAY | Freq: Every day | RESPIRATORY_TRACT | Status: DC

## 2014-12-29 NOTE — Telephone Encounter (Signed)
Asmanex is an excellent choice as a substitute for Pulmicort. Therefore I sent in a prescription. Please let the patient know.

## 2014-12-29 NOTE — Telephone Encounter (Signed)
Insurance denied prior authorization for Pulmicort 180 mcg flexhaler  One medicine that needs to be tried is Careers information officer

## 2014-12-30 DIAGNOSIS — R748 Abnormal levels of other serum enzymes: Secondary | ICD-10-CM | POA: Diagnosis not present

## 2014-12-30 NOTE — Telephone Encounter (Signed)
Patient aware.

## 2014-12-31 NOTE — Telephone Encounter (Signed)
Prelim report sent

## 2015-01-11 ENCOUNTER — Ambulatory Visit (INDEPENDENT_AMBULATORY_CARE_PROVIDER_SITE_OTHER): Payer: Commercial Managed Care - HMO | Admitting: Family Medicine

## 2015-01-11 ENCOUNTER — Encounter: Payer: Self-pay | Admitting: Family Medicine

## 2015-01-11 VITALS — BP 104/63 | HR 76 | Temp 97.5°F | Wt 205.0 lb

## 2015-01-11 DIAGNOSIS — I482 Chronic atrial fibrillation, unspecified: Secondary | ICD-10-CM

## 2015-01-11 DIAGNOSIS — K439 Ventral hernia without obstruction or gangrene: Secondary | ICD-10-CM | POA: Diagnosis not present

## 2015-01-11 DIAGNOSIS — E559 Vitamin D deficiency, unspecified: Secondary | ICD-10-CM | POA: Diagnosis not present

## 2015-01-11 LAB — POCT INR: INR: 2.1

## 2015-01-11 NOTE — Progress Notes (Signed)
Subjective:  Patient ID: Kimberly Obrien, female    DOB: 10-15-1960  Age: 54 y.o. MRN: [id number]  CC: Shortness of Breath and Palpitations   HPI Kimberly Obrien presents for continued palpitations. Occurs several times a day. Last several minutes at a time. Sometimes having some chest pain and shortness of breath with that. No radiation. Chart review reveals that she's had multiple tests for this including a heart catheterization which was normal. She does have some suggestion of atrial fibrillation paroxysmally. She tried treatment for this with Cardizem and later with metoprolol last year but her blood pressure dropped too low and she became listless. No further treatment after the metoprolol was discontinued. 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 She is also on warfarin for ongoing prophylaxis of pulmonary embolism. It is not clear whether this was also meant to help with her atrial fibrillation.  History Pixie has a past medical history of Anticoagulation monitoring by pharmacist; PE (pulmonary embolism); Arthritis; COPD (chronic obstructive pulmonary disease); and Asthma.   She has past surgical history that includes Tubal ligation; Wrist surgery (Right); Cholecystectomy; Spine surgery; Abdominal hysterectomy; Eye surgery; and left and right heart catheterization with coronary angiogram (N/A, 05/13/2014).   Her family history includes Cirrhosis in her mother; Hyperlipidemia in her father.She reports that she quit smoking about 2 years ago. Her smoking use included Cigarettes. She started smoking about 37 years ago. She has a 68 pack-year smoking history. She has never used smokeless tobacco. She reports that she does not drink alcohol or use illicit drugs.  Outpatient Prescriptions Prior to Visit  Medication Sig Dispense Refill  . Vitamin D, Ergocalciferol, (DRISDOL) 50000 UNITS CAPS capsule Take 50,000 Units by mouth  every Tuesday.    . warfarin (COUMADIN) 5 MG tablet Take 2.5 mg by mouth daily.    Marland Kitchen ezetimibe (ZETIA) 10 MG tablet Take 1 tablet (10 mg total) by mouth daily. For cholesterol (Patient not taking: Reported on 01/11/2015) 90 tablet 3  . mometasone (ASMANEX 60 METERED DOSES) 220 MCG/INH inhaler Inhale 2 puffs into the lungs daily. (Patient not taking: Reported on 01/11/2015) 1 Inhaler 11  . Vitamin D, Ergocalciferol, (DRISDOL) 50000 UNITS CAPS capsule Take 1 capsule (50,000 Units total) by mouth 2 (two) times a week. (Patient not taking: Reported on 01/11/2015) 16 capsule 0   No facility-administered medications prior to visit.    ROS Review of Systems  Constitutional: Positive for fatigue. Negative for fever, chills, diaphoresis, appetite change and unexpected weight change.  HENT: Negative for congestion, ear pain, hearing loss, postnasal drip, rhinorrhea, sneezing, sore throat and trouble swallowing.   Eyes: Negative for pain.  Respiratory: Positive for chest tightness and shortness of breath. Negative for apnea and cough.   Cardiovascular: Negative for chest pain and palpitations.  Gastrointestinal: Negative for nausea, vomiting, abdominal pain, diarrhea and constipation.  Genitourinary: Negative for dysuria, frequency and menstrual problem.  Musculoskeletal: Negative for joint swelling and arthralgias.  Skin: Negative for rash.  Neurological: Positive for weakness. Negative for dizziness, numbness and headaches.  Psychiatric/Behavioral: Negative for dysphoric mood and agitation.    Objective:  BP 104/63 mmHg  Pulse 76  Temp(Src) 97.5 F (36.4 C) (Oral)  Wt 205 lb (92.987 kg)  BP Readings from Last 3 Encounters:  01/11/15 104/63  12/21/14 123/73  09/16/14 118/72    Wt Readings from Last 3 Encounters:  01/11/15 205 lb (92.987 kg)  12/21/14 206 lb 12.8 oz (93.804 kg)  12/03/14 206 lb (93.441 kg)     Physical Exam  Constitutional: She is oriented to person, place, and time.  She appears well-developed and well-nourished. No distress.  HENT:  Head: Normocephalic and atraumatic.  Right Ear: External ear normal.  Left Ear: External ear normal.  Nose: Nose normal.  Mouth/Throat: Oropharynx is clear and moist.  Eyes: Conjunctivae and EOM are normal. Pupils are equal, round, and reactive to light.  Neck: Normal range of motion. Neck supple. No thyromegaly present.  Cardiovascular: Normal rate, regular rhythm and normal heart sounds.   No murmur heard. Pulmonary/Chest: Effort normal and breath sounds normal. No respiratory distress. She has no wheezes. She has no rales.  Abdominal: Soft. Bowel sounds are normal. She exhibits distension ( obese) and mass (4 cm protuberant ventral hernia with Valsalva). There is no tenderness.  Lymphadenopathy:    She has no cervical adenopathy.  Neurological: She is alert and oriented to person, place, and time. She has normal reflexes.  Skin: Skin is warm and dry.  Psychiatric: She has a normal mood and affect. Her behavior is normal. Judgment and thought content normal.    No results found for: HGBA1C  Lab Results  Component Value Date   WBC 13.1* 12/21/2014   HGB 15.1 12/21/2014   HCT 47.7 12/21/2014   PLT 161 05/13/2014   GLUCOSE 125* 12/21/2014   CHOL 203* 12/21/2014   TRIG 109 12/21/2014   HDL 61 12/21/2014   LDLCALC 120* 12/21/2014   ALT 60* 12/21/2014   AST 37 12/21/2014   NA 141 12/21/2014   K 4.1 12/21/2014   CL 102 12/21/2014   CREATININE 0.75 12/21/2014   BUN 10 12/21/2014   CO2 24 12/21/2014   TSH 1.160 09/16/2014   INR 1.7 12/21/2014    US Abdomen Complete  11/23/2014   CLINICAL DATA:  54 year old female with right upper quadrant pain and elevated alk phos. Previous cholecystectomy. Initial encounter.  EXAM: ULTRASOUND ABDOMEN COMPLETE  COMPARISON:  Chest CTA 04/30/2014. Lumbar spine CT myelogram 07/25/2006.  FINDINGS: Gallbladder: Surgically absent  Common bile duct: Diameter: 7 mm, within normal  limits for the post cholecystectomy state  Liver: Echogenicity at the upper limits of normal to mildly increased. No intrahepatic biliary ductal dilatation. No discrete liver lesion.  IVC: No abnormality visualized.  Pancreas: Visualized portion unremarkable.  Spleen: Upper limits of normal to mildly enlarged, splenic length 13-14 cm. Echotexture within normal limits. No discrete splenic lesion.  Right Kidney: Length: 11.5 cm. Echogenicity within normal limits. No mass or hydronephrosis visualized.  Left Kidney: Length: 11.4 cm. Normal cortical echogenicity. Mild prominence of the left renal pelvis without strong evidence of hydronephrosis.  Abdominal aorta: No aneurysm visualized.  Other findings: None.  IMPRESSION: 1. Liver echogenicity suggests mild fatty infiltration compatible with steatosis. 2. Surgically absent gallbladder with no evidence of biliary obstruction. 3. Mild nonspecific splenomegaly.   Electronically Signed   By: Genevie Ann M.D.   On: 11/23/2014 08:44    Assessment & Plan:   Kristian was seen today for shortness of breath and palpitations.  Diagnoses and all orders for this visit:  Chronic atrial fibrillation Orders: -     Holter monitor - 48 hour -     POCT INR  Vitamin D deficiency  Ventral hernia without obstruction or gangrene   I am having Ms. Zehner maintain her warfarin, Vitamin D (Ergocalciferol), ezetimibe, and mometasone.  No orders of the defined types were placed in this encounter.  Follow-up: Return in about 1 month (around 02/11/2015).  Claretta Fraise, M.D.

## 2015-01-12 ENCOUNTER — Ambulatory Visit (INDEPENDENT_AMBULATORY_CARE_PROVIDER_SITE_OTHER): Payer: Commercial Managed Care - HMO | Admitting: *Deleted

## 2015-01-12 DIAGNOSIS — I482 Chronic atrial fibrillation, unspecified: Secondary | ICD-10-CM

## 2015-01-12 NOTE — Progress Notes (Signed)
Pt came in today to have holter monitor placed.

## 2015-01-12 NOTE — Patient Instructions (Signed)
Cardiac Event Monitoring A cardiac event monitor is a small recording device used to help detect abnormal heart rhythms (arrhythmias). The monitor is used to record heart rhythm when noticeable symptoms such as the following occur:  Fast heartbeats (palpitations), such as heart racing or fluttering.  Dizziness.  Fainting or light-headedness.  Unexplained weakness. The monitor is wired to two electrodes placed on your chest. Electrodes are flat, sticky disks that attach to your skin. The monitor can be worn for up to 30 days. You will wear the monitor at all times, except when bathing.  HOW TO USE YOUR CARDIAC EVENT MONITOR A technician will prepare your chest for the electrode placement. The technician will show you how to place the electrodes, how to work the monitor, and how to replace the batteries. Take time to practice using the monitor before you leave the office. Make sure you understand how to send the information from the monitor to your health care provider. This requires a telephone with a landline, not a cell phone. You need to:  Wear your monitor at all times, except when you are in water:  Do not get the monitor wet.  Take the monitor off when bathing. Do not swim or use a hot tub with it on.  Keep your skin clean. Do not put body lotion or moisturizer on your chest.  Change the electrodes daily or any time they stop sticking to your skin. You might need to use tape to keep them on.  It is possible that your skin under the electrodes could become irritated. To keep this from happening, try to put the electrodes in slightly different places on your chest. However, they must remain in the area under your left breast and in the upper right section of your chest.  Make sure the monitor is safely clipped to your clothing or in a location close to your body that your health care provider recommends.  Press the button to record when you feel symptoms of heart trouble, such as  dizziness, weakness, light-headedness, palpitations, thumping, shortness of breath, unexplained weakness, or a fluttering or racing heart. The monitor is always on and records what happened slightly before you pressed the button, so do not worry about being too late to get good information.  Keep a diary of your activities, such as walking, doing chores, and taking medicine. It is especially important to note what you were doing when you pushed the button to record your symptoms. This will help your health care provider determine what might be contributing to your symptoms. The information stored in your monitor will be reviewed by your health care provider alongside your diary entries.  Send the recorded information as recommended by your health care provider. It is important to understand that it will take some time for your health care provider to process the results.  Change the batteries as recommended by your health care provider. SEEK IMMEDIATE MEDICAL CARE IF:   You have chest pain.  You have extreme difficulty breathing or shortness of breath.  You develop a very fast heartbeat that persists.  You develop dizziness that does not go away.  You faint or constantly feel you are about to faint. Document Released: 05/16/2008 Document Revised: 12/22/2013 Document Reviewed: 02/03/2013 [facility] Patient Information 2015 [place], [place]. This information is not intended to replace advice given to you by your health care provider. Make sure you discuss any questions you have with your health care provider.

## 2015-01-15 DIAGNOSIS — I4891 Unspecified atrial fibrillation: Secondary | ICD-10-CM | POA: Diagnosis not present

## 2015-01-25 ENCOUNTER — Telehealth: Payer: Self-pay | Admitting: Family Medicine

## 2015-01-25 NOTE — Telephone Encounter (Signed)
I reviewed the report and it was normal.

## 2015-01-25 NOTE — Telephone Encounter (Signed)
Have you received these results yet? Please advise and route to Pool B

## 2015-01-26 NOTE — Telephone Encounter (Signed)
Patient aware of results.

## 2015-01-29 DIAGNOSIS — R748 Abnormal levels of other serum enzymes: Secondary | ICD-10-CM | POA: Diagnosis not present

## 2015-01-30 ENCOUNTER — Telehealth: Payer: Self-pay | Admitting: Internal Medicine

## 2015-01-30 DIAGNOSIS — R0600 Dyspnea, unspecified: Secondary | ICD-10-CM

## 2015-01-30 NOTE — Telephone Encounter (Addendum)
Hello Ed Jamesetta So Timmothy Sours - not sure wh referred her for CPST  I read this patient CPS t for [name]. It fits in with ILD - the reading says obstruction on what the tech did but with desats, outside PFT showing restriction this is more likely  ILD. In face 2015 CT chest shows some ILD. At at age 54 concern is autoimmune process. I will be happy to see her in within our ILD program if you feel it is appropriate  Thanks  Dr. Brand Males, M.D., [facility].C.P Pulmonary and Critical Care Medicine Staff Physician [place] [facility] Pager: [phone], If no answer or between  15:00h - 7:00h: call [phone]  01/30/2015 9:35 PM

## 2015-01-31 NOTE — Telephone Encounter (Signed)
Please make sure that this patient gets referred to pulmonary for further evaluation of her ILD as soon as possible

## 2015-02-01 NOTE — Telephone Encounter (Signed)
Pt called she sees Dr Luan Pulling and will cont to see Dr Luan Pulling. She has a appt with them on 02/08/15.  There is no referral in EPIC for Dr Chase Caller.

## 2015-02-01 NOTE — Telephone Encounter (Signed)
Dr Golden Pop first available is July 28th --  Called and spoke with pt- refused to schedule appt as she was not made aware by Dr Tawanna Sat office that she was being referred out.  I advised to her contact Dr Tawanna Sat office and discuss with them about the referral. I informed her that Dr Laurance Flatten did refer her to see MR for her lungs and she stated that she already has a "lung doctor", states that she will contact Dr Tawanna Sat office and will follow up with [place] after.  Will send to Dr Chase Caller and Dr Laurance Flatten as Juluis Rainier.

## 2015-02-01 NOTE — Telephone Encounter (Signed)
Triage  Dr Laurance Flatten referred this patient. Please set appt to see me first avail. Let me know if this too far out  Thanks  Dr. Brand Males, M.D., [facility].C.P Pulmonary and Critical Care Medicine Staff Physician [place] [facility] Pager: [phone], If no answer or between  15:00h - 7:00h: call [phone]  02/01/2015 12:03 PM

## 2015-02-01 NOTE — Telephone Encounter (Signed)
Please follow-up on this patient and who made the original referral and why and explain to the patient the reason that she is being referred is because the pulmonologist reviewed some of her work and was concerned about her having interstitial lung disease. Were only trying to do what is good for the patient.

## 2015-02-07 NOTE — Telephone Encounter (Signed)
Good enough. Will close note. As long as pulmonary is seeing her somehwere is fine

## 2015-02-08 DIAGNOSIS — G4730 Sleep apnea, unspecified: Secondary | ICD-10-CM | POA: Diagnosis not present

## 2015-02-08 DIAGNOSIS — J849 Interstitial pulmonary disease, unspecified: Secondary | ICD-10-CM | POA: Diagnosis not present

## 2015-02-08 DIAGNOSIS — J45909 Unspecified asthma, uncomplicated: Secondary | ICD-10-CM | POA: Diagnosis not present

## 2015-02-08 DIAGNOSIS — I4891 Unspecified atrial fibrillation: Secondary | ICD-10-CM | POA: Diagnosis not present

## 2015-02-16 ENCOUNTER — Ambulatory Visit (INDEPENDENT_AMBULATORY_CARE_PROVIDER_SITE_OTHER): Payer: Commercial Managed Care - HMO | Admitting: Pharmacist Clinician (PhC)/ Clinical Pharmacy Specialist

## 2015-02-16 DIAGNOSIS — J811 Chronic pulmonary edema: Secondary | ICD-10-CM | POA: Diagnosis not present

## 2015-02-16 DIAGNOSIS — I482 Chronic atrial fibrillation, unspecified: Secondary | ICD-10-CM

## 2015-02-16 LAB — POCT INR: INR: 2

## 2015-03-09 ENCOUNTER — Encounter (HOSPITAL_COMMUNITY)
Admission: RE | Admit: 2015-03-09 | Discharge: 2015-03-09 | Disposition: A | Discharge status: Home / Self Care | Payer: Commercial Managed Care - HMO | Source: Ambulatory Visit | Attending: Pulmonary Disease | Admitting: Pulmonary Disease

## 2015-03-09 ENCOUNTER — Encounter (HOSPITAL_COMMUNITY): Payer: Self-pay

## 2015-03-09 DIAGNOSIS — J449 Chronic obstructive pulmonary disease, unspecified: Secondary | ICD-10-CM | POA: Insufficient documentation

## 2015-03-09 NOTE — Progress Notes (Signed)
Patient arrived for 1st visit/orientation/education at 1430. Patient was referred to CR by Dr. 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 During orientation advised patient on arrival and appointment times what to wear, what to do before, during and after exercise. Reviewed attendance and class policy. Talked about inclement weather and class consultation policy. Pt is scheduled to return Cardiac Rehab on July 26 at 1045am. Pt was advised to come to class 5 minutes before class starts. She was also given instructions on meeting with the dietician and attending the Family Structure classes. Pt is eager to get started. Patient was able to complete 6 minute walk test. Patient was measured for the equipment. Discussed equipment safety with patient. Took patient pre-anthropometric measurements. Patient finished visit at 1545.

## 2015-03-09 NOTE — Patient Instructions (Signed)
Pt has finished orientation and is scheduled to start CR on July 26 at 1045. Pt has been instructed to arrive to class 15 minutes early for scheduled class. Pt has been instructed to wear comfortable clothing and shoes with rubber soles. Pt has been told to take their medications 1 hour prior to coming to class.  If the patient is not going to attend class, she has been instructed to call.

## 2015-03-10 NOTE — Progress Notes (Signed)
[facility] Pulmonary Rehabilitation Baseline Outcomes Assessment   Anthropometrics:  . Height (inches): 66 . Weight (kg): 95.7 . Grip strength was measured using a Dynamometer.  The patient's highest score was a 49.  Functional Status/Exercise Capacity: . Kimberly Obrien had a resting heart rate of 71 BPM, a resting blood pressure of 104/72, and an oxygen saturation of 93 % on 0 liters of O2.  Kimberly Obrien performed a 6-minute walk test on 03/09/15.  The patient completed 1200 feet in 6 minutes with 0 rest breaks.  This quantifies 2.74 METS.   Dyspnea Measures: . The [facility] is a simple and standardized method of classifying disability in patients with COPD.  The assessment correlates disability and dyspnea.  At entrance the patient scored a 3. The scale is provided below.   0= I only get breathless with strenuous exercise. 1= I get short of breath when hurrying on level ground or walking up a slight incline. 2= On level ground, I walk slower than people of the same age because of breathlessness, or have to stop for breath when walking at my own pace. 3= I stop for breath after walking 100 yards or after a few minutes on level ground. 4=I am too breathless to leave the house or I am breathless when dressing.   . The patient completed the [place] ([facility] [place]).  This questionnaire relates activities of daily living and shortness of breath.  The score ranges from 0-120, a higher score relates to severe shortness of breath during activities of daily living. The patient's score at entrance was 77.  Quality of Life: . Ferrans and Powers Quality of Life Index Pulmonary Version is used to assess the patients satisfaction in different domains of their life; health and functioning, socioeconomic, psychological/spiritual, and family. The overall score is recorded out of 30 points.  The patient's goal is to achieve an overall score of 21 or higher.   Kimberly Obrien received a 14.6 at entrance.  . The Patient Health Questionnaire (PHQ-2) is a first step approach for the screening of depression.  If the patient scores positive on the PHQ-2 the patient should be further assessed with the PHQ-9.  The Patient Health Questionnaire (PHQ-9) assesses the degree of depression.  Depression is important to monitor and track in pulmonary patients due to its prevalence in the population.  If the patient advances to the PHQ-9 the goal is to score less than 4 on this assessment.  Kimberly Obrien scored a 0 at entrance.  Clinical Assessment Tools: . The COPD Assessment Test (CAT) is a measurement tool to quantify how much of an impact the disease has on the patient's life.  This assessment aids the Pulmonary Rehab Team in designing the patients individualized treatment plan.  A CAT score ranges from 0-40.  A score of 10 or below indicates that COPD has a low impact on the patient's life whereas a score of 30 or higher indicates a severe impact. The patient's goal is a decrease of 1 point from entrance to discharge.  Kimberly Obrien had a CAT score of 28 at entrance.  Nutrition: . The "Rate My Plate" is a dietary assessment that quantifies the balance of a patient's diet.  This tool allows the Pulmonary Rehab Team to key in on the areas of the patient's diet that needs improving.  The team can then focus their nutritional education on those areas.  If the patient scores 24-40, this means there are many ways they  can make their eating habits healthier, 41-57 states that there are some ways they can make their eating habits healthier and a score of 58-72 states that they are making many healthy choices.  The patient's goal is to achieve a score of 49 or higher on this assessment.  Kimberly Obrien scored a 40 at entrance.  Oxygen Compliance: . Patient is currently on 0 liters at rest, 0 liters at night, and 0 liters for exercise.  Kimberly Obrien is not currently using a cpap/bipap at night.      Education: . Kimberly Obrien will attend education classes during the course of Pulmonary Rehab.  Education classes that will be offered to the patient are Activities of Daily Living and Energy Conservation, Pursed Lip Breathing and Diaphragmatic Breathing, Nutrition, Exercise for the Pulmonary Patient, Warning Signs of Infection, Chronic Lung Disease, Advanced Directives, Medications, and Stress and Meditation.  The patient completed an assessment at the entrance of the program and will complete it again upon discharge to demonstrate the level of understanding provided by the educational classes.  This assessment includes 14 questions regarding all of the education topics above.  Kimberly Obrien achieved a score of 7/14 at entrance.  Smoking Cessation: N/A  Exercise:  Kimberly Obrien will be provided with an individualized Home Exercise Prescription (HEP) at the entrance of the program.  The patient will be followed by the Pulmonary Exercise Physiologist throughout the program to assist with the progression of the frequency, intensity, time, and type of exercise. The patient's long-term goal is to be exercising 30-60 minutes, 3-5 days per week. At entrance, the patient was exercising 0 days at home.

## 2015-03-16 ENCOUNTER — Encounter (HOSPITAL_COMMUNITY)
Admission: RE | Admit: 2015-03-16 | Discharge: 2015-03-16 | Disposition: A | Discharge status: Home / Self Care | Payer: Commercial Managed Care - HMO | Source: Ambulatory Visit | Attending: Pulmonary Disease | Admitting: Pulmonary Disease

## 2015-03-16 DIAGNOSIS — J449 Chronic obstructive pulmonary disease, unspecified: Secondary | ICD-10-CM | POA: Diagnosis not present

## 2015-03-17 DIAGNOSIS — Z1231 Encounter for screening mammogram for malignant neoplasm of breast: Secondary | ICD-10-CM | POA: Diagnosis not present

## 2015-03-18 ENCOUNTER — Encounter (HOSPITAL_COMMUNITY)
Admission: RE | Admit: 2015-03-18 | Discharge: 2015-03-18 | Disposition: A | Discharge status: Home / Self Care | Payer: Commercial Managed Care - HMO | Source: Ambulatory Visit | Attending: Pulmonary Disease | Admitting: Pulmonary Disease

## 2015-03-18 DIAGNOSIS — J449 Chronic obstructive pulmonary disease, unspecified: Secondary | ICD-10-CM | POA: Diagnosis not present

## 2015-03-18 NOTE — Progress Notes (Signed)
Pulmonary Rehabilitation Program Outcomes Report   Orientation:  03/09/15 Graduate Date:  tbd Discharge Date:  tbd # of sessions completed: 3  Pulmonologist: Luan Pulling Family MD:  Stacks Class Time:  1045am  A.  Exercise Program:  Tolerates exercise @ 3.64 METS for 15 minutes and Walk Test Results:  Pre: 2.74 mets  B.  Mental Health:  Good mental attitude  C.  Education/Instruction/Skills  Knows THR for exercise  Uses Perceived Exertion Scale and/or Dyspnea Scale  D.  Nutrition/Weight Control/Body Composition:  Adherence to prescribed nutrition program: fair    E.  Blood Lipids    Lab Results  Component Value Date   CHOL 203* 12/21/2014   HDL 61 12/21/2014   LDLCALC 120* 12/21/2014   TRIG 109 12/21/2014   CHOLHDL 3.3 12/21/2014    F.  Lifestyle Changes:  Making positive lifestyle changes and Not smoking:  Quit 2013  G.  Symptoms noted with exercise:  Asymptomatic  Report Completed By:  Stevphen Rochester RN   Comments:  This is patients first week progress note for [facility]monary Rehab.

## 2015-03-19 ENCOUNTER — Ambulatory Visit: Payer: Self-pay | Admitting: Family Medicine

## 2015-03-23 ENCOUNTER — Encounter (HOSPITAL_COMMUNITY)
Admission: RE | Admit: 2015-03-23 | Discharge: 2015-03-23 | Disposition: A | Discharge status: Home / Self Care | Payer: Commercial Managed Care - HMO | Source: Ambulatory Visit | Attending: Pulmonary Disease | Admitting: Pulmonary Disease

## 2015-03-23 DIAGNOSIS — J449 Chronic obstructive pulmonary disease, unspecified: Secondary | ICD-10-CM | POA: Diagnosis not present

## 2015-03-23 NOTE — Progress Notes (Signed)
Cardiac/Pulmonary Rehab Medication Review by a Pharmacist  Does the patient  feel that his/her medications are working for him/her?  yes  Has the patient been experiencing any side effects to the medications prescribed?  no  Does the patient measure his/her own blood pressure or blood glucose at home?  no   Does the patient have any problems obtaining medications due to transportation or finances?   no  Understanding of regimen: good Understanding of indications: good Potential of compliance: good  Questions asked to Determine Patient Understanding of Medication Regimen:  1. What is the name of the medication?  2. What is the medication used for?  3. When should it be taken?  4. How much should be taken?  5. How will you take it?  6. What side effects should you report?  Understanding Defined as: Excellent: All questions above are correct Good: Questions 1-4 are correct Fair: Questions 1-2 are correct  Poor: 1 or none of the above questions are correct   Pharmacist comments:  This is a late entry as pt here for rehab appt.  Pt states she has difficulty breathing.  Has switched back to Symbicort inhaler but doesn't remember the strength and does not have medicines with her.  No side effects reported from medications.  States she is currently taking prednisone but should be d/c'd 8/10.  Hart Robinsons A 03/23/2015 11:36 AM

## 2015-03-25 ENCOUNTER — Encounter (HOSPITAL_COMMUNITY)
Admission: RE | Admit: 2015-03-25 | Discharge: 2015-03-25 | Disposition: A | Discharge status: Home / Self Care | Payer: Commercial Managed Care - HMO | Source: Ambulatory Visit | Attending: Pulmonary Disease | Admitting: Pulmonary Disease

## 2015-03-25 DIAGNOSIS — J449 Chronic obstructive pulmonary disease, unspecified: Secondary | ICD-10-CM | POA: Diagnosis not present

## 2015-03-30 ENCOUNTER — Encounter (HOSPITAL_COMMUNITY)
Admission: RE | Admit: 2015-03-30 | Discharge: 2015-03-30 | Disposition: A | Discharge status: Home / Self Care | Payer: Commercial Managed Care - HMO | Source: Ambulatory Visit | Attending: Pulmonary Disease | Admitting: Pulmonary Disease

## 2015-03-30 DIAGNOSIS — J449 Chronic obstructive pulmonary disease, unspecified: Secondary | ICD-10-CM | POA: Diagnosis not present

## 2015-03-31 DIAGNOSIS — R748 Abnormal levels of other serum enzymes: Secondary | ICD-10-CM | POA: Diagnosis not present

## 2015-04-01 ENCOUNTER — Encounter (HOSPITAL_COMMUNITY)
Admission: RE | Admit: 2015-04-01 | Discharge: 2015-04-01 | Disposition: A | Discharge status: Home / Self Care | Payer: Commercial Managed Care - HMO | Source: Ambulatory Visit | Attending: Pulmonary Disease | Admitting: Pulmonary Disease

## 2015-04-01 DIAGNOSIS — J449 Chronic obstructive pulmonary disease, unspecified: Secondary | ICD-10-CM | POA: Diagnosis not present

## 2015-04-01 NOTE — Progress Notes (Signed)
Patient was given individual home exercise plan. Handout was reviewed and discussed. Patient verbalized an understanding. 

## 2015-04-05 ENCOUNTER — Ambulatory Visit (INDEPENDENT_AMBULATORY_CARE_PROVIDER_SITE_OTHER): Payer: Commercial Managed Care - HMO | Admitting: Pharmacist

## 2015-04-05 ENCOUNTER — Encounter: Payer: Self-pay | Admitting: Family Medicine

## 2015-04-05 DIAGNOSIS — I482 Chronic atrial fibrillation, unspecified: Secondary | ICD-10-CM

## 2015-04-05 DIAGNOSIS — J811 Chronic pulmonary edema: Secondary | ICD-10-CM

## 2015-04-05 LAB — POCT INR: INR: 2.3

## 2015-04-05 MED ORDER — BUDESONIDE-FORMOTEROL FUMARATE 80-4.5 MCG/ACT IN AERO: 2.0000 | INHALATION_SPRAY | Freq: Every day | RESPIRATORY_TRACT | Status: DC

## 2015-04-05 NOTE — Patient Instructions (Signed)
Anticoagulation Dose Instructions as of 04/05/2015      Kimberly Obrien Tue Wed Thu Fri Sat   New Dose 2.5 mg 2.5 mg 2.5 mg 2.5 mg 2.5 mg 2.5 mg 2.5 mg    Description        Continue warfarin 5mg  - take 1/2 tablet daily.      INR was 2.3 today

## 2015-04-06 ENCOUNTER — Encounter (HOSPITAL_COMMUNITY)
Admission: RE | Admit: 2015-04-06 | Discharge: 2015-04-06 | Disposition: A | Discharge status: Home / Self Care | Payer: Commercial Managed Care - HMO | Source: Ambulatory Visit | Attending: Pulmonary Disease | Admitting: Pulmonary Disease

## 2015-04-06 DIAGNOSIS — J449 Chronic obstructive pulmonary disease, unspecified: Secondary | ICD-10-CM | POA: Diagnosis not present

## 2015-04-08 ENCOUNTER — Encounter (HOSPITAL_COMMUNITY)
Admission: RE | Admit: 2015-04-08 | Discharge: 2015-04-08 | Disposition: A | Discharge status: Home / Self Care | Payer: Commercial Managed Care - HMO | Source: Ambulatory Visit | Attending: Pulmonary Disease | Admitting: Pulmonary Disease

## 2015-04-08 DIAGNOSIS — J449 Chronic obstructive pulmonary disease, unspecified: Secondary | ICD-10-CM | POA: Diagnosis not present

## 2015-04-12 DIAGNOSIS — R748 Abnormal levels of other serum enzymes: Secondary | ICD-10-CM | POA: Diagnosis not present

## 2015-04-13 ENCOUNTER — Encounter (HOSPITAL_COMMUNITY)
Admission: RE | Admit: 2015-04-13 | Discharge: 2015-04-13 | Disposition: A | Discharge status: Home / Self Care | Payer: Commercial Managed Care - HMO | Source: Ambulatory Visit | Attending: Pulmonary Disease | Admitting: Pulmonary Disease

## 2015-04-13 DIAGNOSIS — J449 Chronic obstructive pulmonary disease, unspecified: Secondary | ICD-10-CM | POA: Diagnosis not present

## 2015-04-15 ENCOUNTER — Encounter (HOSPITAL_COMMUNITY)
Admission: RE | Admit: 2015-04-15 | Discharge: 2015-04-15 | Disposition: A | Discharge status: Home / Self Care | Payer: Commercial Managed Care - HMO | Source: Ambulatory Visit | Attending: Pulmonary Disease | Admitting: Pulmonary Disease

## 2015-04-15 DIAGNOSIS — J449 Chronic obstructive pulmonary disease, unspecified: Secondary | ICD-10-CM | POA: Diagnosis not present

## 2015-04-20 ENCOUNTER — Encounter (HOSPITAL_COMMUNITY)
Admission: RE | Admit: 2015-04-20 | Discharge: 2015-04-20 | Disposition: A | Discharge status: Home / Self Care | Payer: Commercial Managed Care - HMO | Source: Ambulatory Visit | Attending: Pulmonary Disease | Admitting: Pulmonary Disease

## 2015-04-20 DIAGNOSIS — J449 Chronic obstructive pulmonary disease, unspecified: Secondary | ICD-10-CM | POA: Diagnosis not present

## 2015-04-20 NOTE — Progress Notes (Addendum)
Kimberly Obrien 54 y.o. female  30 day Psychosocial Note  Patient psychosocial assessment reveals no barriers to participation in Pulmonary Rehab. Patient does continue to exhibit positive coping skills. Offered emotional support and reassurance. Patient does feel she is making progress toward Pulmonary Rehab goals. Patient reports her health and activity level has not improved in the past 30 days as evidenced by patient's report of unchanged ability to walk more without SOB.  Patient reports feeling positive about current and projected progression in Pulmonary Rehab. After reviewing the patient's treatment plan, the patient is making progress toward Pulmonary Rehab goals. Patient's rate of progress toward rehab goals is fair. Plan of action to help patient continue to work towards rehab goals include encouragement, support and education. Will continue to monitor and evaluate progress toward psychosocial goal(s).  Goal(s) in progress: Help patient work toward returning to meaningful activities that improve patient's QOL and are attainable with patient's lung disease Breathe better Lose weight Get into shape Get HR down lower

## 2015-04-22 ENCOUNTER — Encounter (HOSPITAL_COMMUNITY)
Admission: RE | Admit: 2015-04-22 | Discharge: 2015-04-22 | Disposition: A | Discharge status: Home / Self Care | Payer: Medicare HMO | Source: Ambulatory Visit | Attending: Pulmonary Disease | Admitting: Pulmonary Disease

## 2015-04-22 DIAGNOSIS — J449 Chronic obstructive pulmonary disease, unspecified: Secondary | ICD-10-CM | POA: Insufficient documentation

## 2015-04-27 ENCOUNTER — Encounter (HOSPITAL_COMMUNITY)
Admission: RE | Admit: 2015-04-27 | Discharge: 2015-04-27 | Disposition: A | Discharge status: Home / Self Care | Payer: Medicare HMO | Source: Ambulatory Visit | Attending: Pulmonary Disease | Admitting: Pulmonary Disease

## 2015-04-27 DIAGNOSIS — J449 Chronic obstructive pulmonary disease, unspecified: Secondary | ICD-10-CM | POA: Diagnosis not present

## 2015-04-29 ENCOUNTER — Encounter (HOSPITAL_COMMUNITY)
Admission: RE | Admit: 2015-04-29 | Discharge: 2015-04-29 | Disposition: A | Discharge status: Home / Self Care | Payer: Medicare HMO | Source: Ambulatory Visit | Attending: Pulmonary Disease | Admitting: Pulmonary Disease

## 2015-04-29 DIAGNOSIS — J449 Chronic obstructive pulmonary disease, unspecified: Secondary | ICD-10-CM | POA: Diagnosis not present

## 2015-04-30 DIAGNOSIS — R748 Abnormal levels of other serum enzymes: Secondary | ICD-10-CM | POA: Diagnosis not present

## 2015-05-04 ENCOUNTER — Encounter (HOSPITAL_COMMUNITY)
Admission: RE | Admit: 2015-05-04 | Discharge: 2015-05-04 | Disposition: A | Discharge status: Home / Self Care | Payer: Medicare HMO | Source: Ambulatory Visit | Attending: Pulmonary Disease | Admitting: Pulmonary Disease

## 2015-05-04 DIAGNOSIS — J449 Chronic obstructive pulmonary disease, unspecified: Secondary | ICD-10-CM | POA: Diagnosis not present

## 2015-05-06 ENCOUNTER — Encounter (HOSPITAL_COMMUNITY)
Admission: RE | Admit: 2015-05-06 | Discharge: 2015-05-06 | Disposition: A | Discharge status: Home / Self Care | Payer: Medicare HMO | Source: Ambulatory Visit | Attending: Pulmonary Disease | Admitting: Pulmonary Disease

## 2015-05-06 DIAGNOSIS — J449 Chronic obstructive pulmonary disease, unspecified: Secondary | ICD-10-CM | POA: Diagnosis not present

## 2015-05-10 ENCOUNTER — Ambulatory Visit (INDEPENDENT_AMBULATORY_CARE_PROVIDER_SITE_OTHER): Payer: Commercial Managed Care - HMO | Admitting: Pharmacist

## 2015-05-10 DIAGNOSIS — I482 Chronic atrial fibrillation, unspecified: Secondary | ICD-10-CM

## 2015-05-10 DIAGNOSIS — J811 Chronic pulmonary edema: Secondary | ICD-10-CM

## 2015-05-10 LAB — POCT INR: INR: 1.9

## 2015-05-10 NOTE — Patient Instructions (Signed)
Anticoagulation Dose Instructions as of 05/10/2015      Kimberly Obrien Tue Wed Thu Fri Sat   New Dose 2.5 mg 2.5 mg 2.5 mg 2.5 mg 2.5 mg 2.5 mg 2.5 mg    Description        Take 1 tablet today, then continue warfarin 29m - take 1/2 tablet daily.      INR was 1.9 today

## 2015-05-11 ENCOUNTER — Encounter (HOSPITAL_COMMUNITY)
Admission: RE | Admit: 2015-05-11 | Discharge: 2015-05-11 | Disposition: A | Discharge status: Home / Self Care | Payer: Medicare HMO | Source: Ambulatory Visit | Attending: Pulmonary Disease | Admitting: Pulmonary Disease

## 2015-05-11 DIAGNOSIS — J449 Chronic obstructive pulmonary disease, unspecified: Secondary | ICD-10-CM | POA: Diagnosis not present

## 2015-05-11 NOTE — Progress Notes (Signed)
[name] 54 y.o. female  60 day Psychosocial Note  Patient psychosocial assessment reveals no barriers to participation in Pulmonary Rehab.  Patient does continue to exhibit positive  Offered emotional support and reassurance. Patient does feel she is making progress toward Pulmonary Rehab goals. Patient reports her health and activity level has improved a little in the past 30 days as evidenced by patient's report of increased ability to do more things without SOB so quickly. Patient reports feeling positive about current and projected progression in Pulmonary Rehab. After reviewing the patient's treatment plan, the patient is making progress toward Pulmonary Rehab goals. Patient's rate of progress toward rehab goals is fair. Plan of action to help patient continue to work towards rehab goals include enouragement, support and education. Will continue to monitor and evaluate progress toward psychosocial goal(s).  Patient states she has not felt she has lost weight but felt she has gained muscle and strength.  HR resting is already WNL in 70s and advised patient of this.    Goal(s) in progress:  Help patient work toward returning to meaningful activities that improve patient's QOL and are attainable with patient's lung disease Breathe better Lose weight Get into shape Get HR down lower

## 2015-05-12 DIAGNOSIS — J811 Chronic pulmonary edema: Secondary | ICD-10-CM | POA: Diagnosis not present

## 2015-05-12 DIAGNOSIS — J45909 Unspecified asthma, uncomplicated: Secondary | ICD-10-CM | POA: Diagnosis not present

## 2015-05-12 DIAGNOSIS — I4891 Unspecified atrial fibrillation: Secondary | ICD-10-CM | POA: Diagnosis not present

## 2015-05-13 ENCOUNTER — Encounter (HOSPITAL_COMMUNITY)
Admission: RE | Admit: 2015-05-13 | Discharge: 2015-05-13 | Disposition: A | Discharge status: Home / Self Care | Payer: Medicare HMO | Source: Ambulatory Visit | Attending: Pulmonary Disease | Admitting: Pulmonary Disease

## 2015-05-13 DIAGNOSIS — J449 Chronic obstructive pulmonary disease, unspecified: Secondary | ICD-10-CM | POA: Diagnosis not present

## 2015-05-17 ENCOUNTER — Ambulatory Visit (INDEPENDENT_AMBULATORY_CARE_PROVIDER_SITE_OTHER): Payer: Commercial Managed Care - HMO | Admitting: Family Medicine

## 2015-05-17 ENCOUNTER — Encounter: Payer: Self-pay | Admitting: Family Medicine

## 2015-05-17 VITALS — BP 101/58 | HR 77 | Temp 97.4°F | Ht 66.0 in | Wt 211.2 lb

## 2015-05-17 DIAGNOSIS — R1031 Right lower quadrant pain: Secondary | ICD-10-CM | POA: Diagnosis not present

## 2015-05-17 DIAGNOSIS — I480 Paroxysmal atrial fibrillation: Secondary | ICD-10-CM

## 2015-05-17 DIAGNOSIS — R072 Precordial pain: Secondary | ICD-10-CM | POA: Diagnosis not present

## 2015-05-17 DIAGNOSIS — J45909 Unspecified asthma, uncomplicated: Secondary | ICD-10-CM

## 2015-05-17 DIAGNOSIS — K2100 Gastro-esophageal reflux disease with esophagitis, without bleeding: Secondary | ICD-10-CM

## 2015-05-17 DIAGNOSIS — F411 Generalized anxiety disorder: Secondary | ICD-10-CM | POA: Diagnosis not present

## 2015-05-17 DIAGNOSIS — R0600 Dyspnea, unspecified: Secondary | ICD-10-CM | POA: Diagnosis not present

## 2015-05-17 DIAGNOSIS — R002 Palpitations: Secondary | ICD-10-CM

## 2015-05-17 DIAGNOSIS — I272 Pulmonary hypertension, unspecified: Secondary | ICD-10-CM

## 2015-05-17 DIAGNOSIS — G8929 Other chronic pain: Secondary | ICD-10-CM | POA: Diagnosis not present

## 2015-05-17 DIAGNOSIS — I270 Primary pulmonary hypertension: Secondary | ICD-10-CM | POA: Diagnosis not present

## 2015-05-17 DIAGNOSIS — N941 Dyspareunia: Secondary | ICD-10-CM

## 2015-05-17 DIAGNOSIS — IMO0002 Reserved for concepts with insufficient information to code with codable children: Secondary | ICD-10-CM

## 2015-05-17 DIAGNOSIS — R1032 Left lower quadrant pain: Secondary | ICD-10-CM | POA: Diagnosis not present

## 2015-05-17 DIAGNOSIS — R1030 Lower abdominal pain, unspecified: Secondary | ICD-10-CM | POA: Diagnosis not present

## 2015-05-17 LAB — POCT UA - MICROSCOPIC ONLY
Casts, Ur, LPF, POC: NEGATIVE
Crystals, Ur, HPF, POC: NEGATIVE
Yeast, UA: NEGATIVE

## 2015-05-17 LAB — POCT URINALYSIS DIPSTICK
Bilirubin, UA: NEGATIVE
Blood, UA: NEGATIVE
Glucose, UA: NEGATIVE
Ketones, UA: NEGATIVE
Nitrite, UA: NEGATIVE
Spec Grav, UA: 1.02
Urobilinogen, UA: NEGATIVE
pH, UA: 6

## 2015-05-17 MED ORDER — CIPROFLOXACIN HCL 500 MG PO TABS: 500.0000 mg | ORAL_TABLET | Freq: Two times a day (BID) | ORAL | Status: DC

## 2015-05-17 NOTE — Progress Notes (Addendum)
Subjective:  Patient ID: Kimberly Obrien, female    DOB: [birth year]  Age: 54 y.o. MRN: [id number]  CC: Abdominal Pain   HPI Kimberly Obrien presents for started [place] last week. Suprapubic pain since hysterectomy 2 years ago. Bladder feels big, irritated. Dyspareunia. Feels swollen and has suprapubic pain for a couple of days after intercourse. She would like to have gynecological evaluation. Patient is also very concerned that no one has been able to find out why she has palpitations. She says her heart goes very rapidly at times. She says it occurred during her recent Holter monitor. However, review of the monitor report shows no evidence for serious dysrhythmia. Additionally she says she's had a heart cath and stress tests and nobody has ever found anything. However she knows that she is going to die soon because her heart just can't take this. She states that pulmonology has recently put her on oxygen due to her low pulse ox readings. It drops into the 80s with minimal exertion. Notefrom pulmonology reviewed showing that she does have evidence for asthma and for pulmonary hypertension.   Patient in for follow-up of atrial fibrillation. Patient denies any recent bouts of chest pain or palpitations. Additionally, patient is taking anticoagulants. Patient denies any recent excessive bleeding episodes including epistaxis, bleeding from the gums, genitalia, rectal bleeding or hematuria. Additionally there has been no excessive bruising.  History Kimberly Obrien has a past medical history of Anticoagulation monitoring by pharmacist; PE (pulmonary embolism); Arthritis; COPD (chronic obstructive pulmonary disease); and Asthma.   She has past surgical history that includes Tubal ligation; Wrist surgery (Right); Cholecystectomy; Spine surgery; Abdominal hysterectomy; Eye surgery; and left and right heart catheterization with coronary angiogram (N/A, 05/13/2014).   Her family history includes Cirrhosis  in her mother; Hyperlipidemia in her father.She reports that she quit smoking about 3 years ago. Her smoking use included Cigarettes. She started smoking about 37 years ago. She has a 68 pack-year smoking history. She has never used smokeless tobacco. She reports that she does not drink alcohol or use illicit drugs.  Outpatient Prescriptions Prior to Visit  Medication Sig Dispense Refill  . acetaminophen (TYLENOL) 325 MG tablet Take 650 mg by mouth every 6 (six) hours as needed for mild pain.    . budesonide-formoterol (SYMBICORT) 80-4.5 MCG/ACT inhaler Inhale 2 puffs into the lungs daily. 1 Inhaler 0  . Vitamin D, Ergocalciferol, (DRISDOL) 50000 UNITS CAPS capsule Take 50,000 Units by mouth every Tuesday.    . warfarin (COUMADIN) 5 MG tablet Take 2.5 mg by mouth daily.     No facility-administered medications prior to visit.    ROS Review of Systems  Constitutional: Negative for fever, chills, diaphoresis, appetite change, fatigue and unexpected weight change.  HENT: Negative for congestion, ear pain, hearing loss, postnasal drip, rhinorrhea, sneezing, sore throat and trouble swallowing.   Eyes: Negative for pain.  Respiratory: Positive for cough, chest tightness, shortness of breath and wheezing.   Cardiovascular: Positive for chest pain and palpitations. Negative for leg swelling.  Gastrointestinal: Positive for abdominal pain (uprapubic see history of present illness. This is moderate it feels like the mesh from her surgery is causing the pain.). Negative for nausea, vomiting, diarrhea and constipation.  Genitourinary: Positive for dysuria, vaginal pain and pelvic pain. Negative for frequency, hematuria, vaginal bleeding, vaginal discharge and menstrual problem.  Musculoskeletal: Negative for joint swelling and arthralgias.  Skin: Negative for rash.  Neurological: Negative for dizziness, weakness, numbness and headaches.  Psychiatric/Behavioral: Negative for  suicidal ideas, confusion,  self-injury, dysphoric mood, decreased concentration and agitation. The patient is not nervous/anxious.     Objective:  BP 101/58 mmHg  Pulse 77  Temp(Src) 97.4 F (36.3 C) (Oral)  Ht 5' 6" (1.676 m)  Wt 211 lb 3.2 oz (95.8 kg)  BMI 34.10 kg/m2  BP Readings from Last 3 Encounters:  05/17/15 101/58  03/09/15 104/72  01/11/15 104/63    Wt Readings from Last 3 Encounters:  05/17/15 211 lb 3.2 oz (95.8 kg)  03/09/15 211 lb 3.2 oz (95.8 kg)  01/11/15 205 lb (92.987 kg)     Physical Exam  Constitutional: She is oriented to person, place, and time. She appears well-developed and well-nourished. No distress.  HENT:  Head: Normocephalic and atraumatic.  Right Ear: External ear normal.  Left Ear: External ear normal.  Nose: Nose normal.  Mouth/Throat: Oropharynx is clear and moist.  Eyes: Conjunctivae and EOM are normal. Pupils are equal, round, and reactive to light.  Neck: Normal range of motion. Neck supple. No thyromegaly present.  Cardiovascular: Normal rate, regular rhythm and normal heart sounds.   No murmur heard. Pulmonary/Chest: Effort normal and breath sounds normal. No respiratory distress. She has no wheezes. She has no rales.  Abdominal: Soft. Bowel sounds are normal. She exhibits no distension. There is no tenderness.  Lymphadenopathy:    She has no cervical adenopathy.  Neurological: She is alert and oriented to person, place, and time. She has normal reflexes.  Skin: Skin is warm and dry.  Psychiatric:  Dysphoric, nervous anxious and distraught    No results found for: HGBA1C  Lab Results  Component Value Date   WBC 13.1* 12/21/2014   HGB 15.1 12/21/2014   HCT 47.7 12/21/2014   PLT 161 05/13/2014   GLUCOSE 125* 12/21/2014   CHOL 203* 12/21/2014   TRIG 109 12/21/2014   HDL 61 12/21/2014   LDLCALC 120* 12/21/2014   ALT 60* 12/21/2014   AST 37 12/21/2014   NA 141 12/21/2014   K 4.1 12/21/2014   CL 102 12/21/2014   CREATININE 0.75 12/21/2014   BUN  10 12/21/2014   CO2 24 12/21/2014   TSH 1.160 09/16/2014   INR 1.9 05/10/2015    No results found.  Assessment & Plan:   Kimberly Obrien was seen today for abdominal pain.  Diagnoses and all orders for this visit:  Abdominal pain, chronic, bilateral lower quadrant -     POCT urinalysis dipstick -     POCT UA - Microscopic Only -     Urine culture -     CT Abdomen Pelvis W Contrast; Future -     Ambulatory referral to Gynecology -     CBC with Differential/Platelet -     D-dimer, quantitative (not at [facility]) -     CMP14+EGFR  Dyspareunia -     CT Abdomen Pelvis W Contrast; Future -     Ambulatory referral to Gynecology -     CBC with Differential/Platelet -     D-dimer, quantitative (not at [facility]) -     CMP14+EGFR  Anxiety state  Intrinsic asthma, unspecified asthma severity, uncomplicated  Pulmonary hypertension  Dyspnea  Palpitations  Gastroesophageal reflux disease with esophagitis  Precordial pain  Paroxysmal atrial fibrillation  Other orders -     ciprofloxacin (CIPRO) 500 MG tablet; Take 1 tablet (500 mg total) by mouth 2 (two) times daily.   I am having Kimberly Obrien start on ciprofloxacin. I am also having her maintain her  warfarin, Vitamin D (Ergocalciferol), acetaminophen, budesonide-formoterol, and Aclidinium Bromide.  Meds ordered this encounter  Medications  . Aclidinium Bromide (TUDORZA PRESSAIR) 400 MCG/ACT AEPB    Sig: Inhale 1 puff into the lungs 2 (two) times daily.  . ciprofloxacin (CIPRO) 500 MG tablet    Sig: Take 1 tablet (500 mg total) by mouth 2 (two) times daily.    Dispense:  14 tablet    Refill:  0   although patient appears quite anxious and I believe she is very much in denial of this. She feels nobody has been able to pin this down even though she carries appropriate diagnoses related to her condition and has been prescribed oxygen as well as multiple inhalers. Patient declined a Holter monitor.  Follow-up: Return in about 3 months  (around 08/16/2015).  Claretta Fraise, M.D.

## 2015-05-18 ENCOUNTER — Encounter (HOSPITAL_COMMUNITY)
Admission: RE | Admit: 2015-05-18 | Discharge: 2015-05-18 | Disposition: A | Discharge status: Home / Self Care | Payer: Medicare HMO | Source: Ambulatory Visit | Attending: Pulmonary Disease | Admitting: Pulmonary Disease

## 2015-05-18 DIAGNOSIS — J449 Chronic obstructive pulmonary disease, unspecified: Secondary | ICD-10-CM | POA: Diagnosis not present

## 2015-05-20 ENCOUNTER — Encounter (HOSPITAL_COMMUNITY)
Admission: RE | Admit: 2015-05-20 | Discharge: 2015-05-20 | Disposition: A | Discharge status: Home / Self Care | Payer: Medicare HMO | Source: Ambulatory Visit | Attending: Pulmonary Disease | Admitting: Pulmonary Disease

## 2015-05-20 DIAGNOSIS — J449 Chronic obstructive pulmonary disease, unspecified: Secondary | ICD-10-CM | POA: Diagnosis not present

## 2015-05-20 LAB — URINE CULTURE

## 2015-05-25 ENCOUNTER — Encounter (HOSPITAL_COMMUNITY)
Admission: RE | Admit: 2015-05-25 | Discharge: 2015-05-25 | Disposition: A | Discharge status: Home / Self Care | Payer: Commercial Managed Care - HMO | Source: Ambulatory Visit | Attending: Pulmonary Disease | Admitting: Pulmonary Disease

## 2015-05-25 ENCOUNTER — Ambulatory Visit (HOSPITAL_COMMUNITY)
Admission: RE | Admit: 2015-05-25 | Discharge: 2015-05-25 | Disposition: A | Discharge status: Home / Self Care | Payer: Commercial Managed Care - HMO | Source: Ambulatory Visit | Attending: Family Medicine | Admitting: Family Medicine

## 2015-05-25 DIAGNOSIS — R102 Pelvic and perineal pain: Secondary | ICD-10-CM | POA: Diagnosis not present

## 2015-05-25 DIAGNOSIS — R1031 Right lower quadrant pain: Secondary | ICD-10-CM

## 2015-05-25 DIAGNOSIS — IMO0002 Reserved for concepts with insufficient information to code with codable children: Secondary | ICD-10-CM

## 2015-05-25 DIAGNOSIS — J449 Chronic obstructive pulmonary disease, unspecified: Secondary | ICD-10-CM | POA: Diagnosis not present

## 2015-05-25 DIAGNOSIS — G8929 Other chronic pain: Secondary | ICD-10-CM

## 2015-05-25 DIAGNOSIS — R1032 Left lower quadrant pain: Secondary | ICD-10-CM

## 2015-05-25 DIAGNOSIS — R1084 Generalized abdominal pain: Secondary | ICD-10-CM | POA: Insufficient documentation

## 2015-05-25 MED ORDER — IOHEXOL 300 MG/ML  SOLN
100.0000 mL | Freq: Once | INTRAMUSCULAR | Status: AC | PRN
  Administered 2015-05-25: 100 mL via INTRAVENOUS

## 2015-05-26 ENCOUNTER — Telehealth: Payer: Self-pay | Admitting: Family Medicine

## 2015-05-26 MED ORDER — NITROFURANTOIN MACROCRYSTAL 100 MG PO CAPS: 100.0000 mg | ORAL_CAPSULE | Freq: Two times a day (BID) | ORAL | Status: DC

## 2015-05-26 NOTE — Telephone Encounter (Signed)
Stp and reviewed her results and treatment options, pt voiced understanding on how to take her medications.

## 2015-05-26 NOTE — Telephone Encounter (Signed)
Patient aware Macrodantin called to [facility].   Pharmacist says taking food with each dose can help eliminate some of the side effects.  100 mg BID daily for 14 days would be easier to take than small doses of 50 mg QID.

## 2015-05-27 ENCOUNTER — Other Ambulatory Visit (INDEPENDENT_AMBULATORY_CARE_PROVIDER_SITE_OTHER): Payer: Commercial Managed Care - HMO

## 2015-05-27 ENCOUNTER — Encounter (HOSPITAL_COMMUNITY)
Admission: RE | Admit: 2015-05-27 | Discharge: 2015-05-27 | Disposition: A | Discharge status: Home / Self Care | Payer: Commercial Managed Care - HMO | Source: Ambulatory Visit | Attending: Pulmonary Disease | Admitting: Pulmonary Disease

## 2015-05-27 DIAGNOSIS — R1032 Left lower quadrant pain: Secondary | ICD-10-CM | POA: Diagnosis not present

## 2015-05-27 DIAGNOSIS — N941 Unspecified dyspareunia: Secondary | ICD-10-CM | POA: Diagnosis not present

## 2015-05-27 DIAGNOSIS — I270 Primary pulmonary hypertension: Secondary | ICD-10-CM | POA: Diagnosis not present

## 2015-05-27 DIAGNOSIS — G8929 Other chronic pain: Secondary | ICD-10-CM | POA: Diagnosis not present

## 2015-05-27 DIAGNOSIS — R002 Palpitations: Secondary | ICD-10-CM

## 2015-05-27 DIAGNOSIS — J449 Chronic obstructive pulmonary disease, unspecified: Secondary | ICD-10-CM | POA: Diagnosis not present

## 2015-05-27 DIAGNOSIS — R1031 Right lower quadrant pain: Secondary | ICD-10-CM | POA: Diagnosis not present

## 2015-05-27 NOTE — Progress Notes (Signed)
Lab from ov  On 05/17/15

## 2015-05-28 ENCOUNTER — Telehealth: Payer: Self-pay | Admitting: Family Medicine

## 2015-05-28 LAB — CMP14+EGFR
ALT: 114 IU/L — ABNORMAL HIGH (ref 0–32)
AST: 76 IU/L — ABNORMAL HIGH (ref 0–40)
Albumin/Globulin Ratio: 1.8 (ref 1.1–2.5)
Albumin: 4.2 g/dL (ref 3.5–5.5)
Alkaline Phosphatase: 76 IU/L (ref 39–117)
BUN/Creatinine Ratio: 10 (ref 9–23)
BUN: 9 mg/dL (ref 6–24)
Bilirubin Total: 0.9 mg/dL (ref 0.0–1.2)
CO2: 26 mmol/L (ref 18–29)
Calcium: 9.3 mg/dL (ref 8.7–10.2)
Chloride: 102 mmol/L (ref 97–108)
Creatinine, Ser: 0.91 mg/dL (ref 0.57–1.00)
GFR calc Af Amer: 83 mL/min/{1.73_m2} (ref >59)
GFR calc non Af Amer: 72 mL/min/{1.73_m2} (ref >59)
Globulin, Total: 2.3 g/dL (ref 1.5–4.5)
Glucose: 96 mg/dL (ref 65–99)
Potassium: 3.6 mmol/L (ref 3.5–5.2)
Sodium: 143 mmol/L (ref 134–144)
Total Protein: 6.5 g/dL (ref 6.0–8.5)

## 2015-05-28 LAB — CBC WITH DIFFERENTIAL/PLATELET
Basophils Absolute: 0 10*3/uL (ref 0.0–0.2)
Basos: 0 %
EOS (ABSOLUTE): 0.1 10*3/uL (ref 0.0–0.4)
Eos: 1 %
Hematocrit: 43.1 % (ref 34.0–46.6)
Hemoglobin: 14.5 g/dL (ref 11.1–15.9)
Immature Grans (Abs): 0 10*3/uL (ref 0.0–0.1)
Immature Granulocytes: 0 %
Lymphocytes Absolute: 2.6 10*3/uL (ref 0.7–3.1)
Lymphs: 22 %
MCH: 29 pg (ref 26.6–33.0)
MCHC: 33.6 g/dL (ref 31.5–35.7)
MCV: 86 fL (ref 79–97)
Monocytes Absolute: 0.6 10*3/uL (ref 0.1–0.9)
Monocytes: 5 %
Neutrophils Absolute: 8.7 10*3/uL — ABNORMAL HIGH (ref 1.4–7.0)
Neutrophils: 72 %
Platelets: 178 10*3/uL (ref 150–379)
RBC: 5 x10E6/uL (ref 3.77–5.28)
RDW: 14.1 % (ref 12.3–15.4)
WBC: 11.9 10*3/uL — ABNORMAL HIGH (ref 3.4–10.8)

## 2015-05-28 LAB — D-DIMER, QUANTITATIVE: D-DIMER: 0.25 mg/L FEU (ref 0.00–0.49)

## 2015-05-28 MED ORDER — PROMETHAZINE HCL 25 MG PO TABS: 25.0000 mg | ORAL_TABLET | Freq: Three times a day (TID) | ORAL | Status: DC | PRN

## 2015-05-28 NOTE — Telephone Encounter (Signed)
Stp and rx for phenergan sent over to pharmacy.

## 2015-06-01 ENCOUNTER — Encounter (HOSPITAL_COMMUNITY)
Admission: RE | Admit: 2015-06-01 | Discharge: 2015-06-01 | Disposition: A | Discharge status: Home / Self Care | Payer: Commercial Managed Care - HMO | Source: Ambulatory Visit | Attending: Pulmonary Disease | Admitting: Pulmonary Disease

## 2015-06-01 DIAGNOSIS — J449 Chronic obstructive pulmonary disease, unspecified: Secondary | ICD-10-CM | POA: Diagnosis not present

## 2015-06-03 ENCOUNTER — Encounter (HOSPITAL_COMMUNITY): Payer: Commercial Managed Care - HMO

## 2015-06-03 NOTE — Progress Notes (Signed)
Kimberly Obrien Pulmonary Rehabilitation                                                             Final/Discharge Outcome Results  Anthropometrics: . Height (inches): 66 . Weight (kg): 96.2 ? This is a change of 0.5kg from entrance. . Grip strength was measured using a Dynamometer.  The patient's discharge score was a 70.   ? This is a change of 7.69% from entrance.  Functional Status/Exercise Capacity: . Kimberly Obrien had a resting heart rate of 84 BPM, a resting blood pressure of 110/60, and an oxygen saturation of 95 % on 0 liters of O2.  Kimberly Obrien performed a discharge 6-minute walk test on 06/01/2015.  The patient completed 1300 feet in 6 minutes with 0 rest breaks.  This quantifies 2.88 METS. The patient's goal is to add 82 feet onto the baseline 6MWT.   ? The patient increased their 6-minute walk test distance by 100 feet and their MET level by 0.14 METs.  Dyspnea Measures: . The [employer] is a simple and standardized method of classifying disability in patients with COPD.  The assessment correlates disability and dyspnea.  Upon discharge the patients resting score was 3. The scale is provided below.  ? This is a change of 0 from entrance.  0= I only get breathless with strenuous exercise. 1= I get short of breath when hurrying on level ground or walking up a slight incline. 2= On level ground, I walk slower than people of the same age because of breathlessness, or have to stop for breath when walking at my own pace. 3= I stop for breath after walking 100 yards or after a few minutes on level ground. 4=I am too breathless to leave the house or I am breathless when dressing.   . The patient completed the Kimberly Obrien ([facility]).  This questionnaire relates activities of daily living and shortness of breath.  The score ranges from 0-120, a higher score relates to severe shortness of breath during  activities of daily living. The patient's score at discharge was 78. ? This is a change of 1.3% from entrance.  Quality of Life: . Kimberly Obrien and Powers Quality of Life Index Pulmonary Version is used to assess the patients satisfaction in different domains of their life; health and functioning, socioeconomic, psychological/spiritual, and family. The overall score is recorded out of 30 points.  The patient's goal is to achieve an overall score of 21 or higher.  Kimberly Obrien received a 15.74 upon discharge.  ? This is a change of 7.83% from entrance.  . The Patient Health Questionnaire (PHQ-2) is a first step approach for the screening of depression.  If the patient scores positive on the PHQ-2 the patient should be further assessed with the PHQ-9.  The Patient Health Questionnaire (PHQ-9) assesses the degree of depression.  Depression is important to monitor and track in pulmonary patients due to its prevalence in the population.  If the patient advances to the PHQ-9 the goal is to score less than 4 on this assessment.  Kimberly Obrien scored a 0 at discharge. ? This is a change of 0 from entrance.   Clinical Assessment Tools: . The COPD Assessment Test (CAT) is a measurement tool to quantify how much of an impact the disease has on the patient's life.  This assessment aids the Pulmonary Rehab Team in designing the patients individualized treatment plan.  A CAT score ranges from 0-40.  A score of 10 or below indicates that COPD has a low impact on the patient's life whereas a score of 30 or higher indicates a severe impact. The patient's goal is a decrease of 1 point from entrance to discharge.  Kimberly Obrien had a CAT score of 23 upon discharge.   ? This is a change of -17.86% from entrance.  Nutrition: . The "Rate My Plate" is a dietary assessment that quantifies the balance of a patient's diet.  This tool allows the Pulmonary Rehab Team to key in on the areas of the patient's diet that needs improving.  The team can  then focus their nutritional education on those areas.  If the patient scores 24-40, this means there are many ways they can make their eating habits healthier, 41-57 states that there are some ways they can make their eating habits healthier and a score of 58-72 states that they are making many healthy choices.  The patient's goal is to achieve a score of 49 or higher on this assessment.  Kimberly Obrien scored a 39 upon discharge.   ? This is a change of 12.5% from entrance.  Oxygen Compliance: . Patient is currently on 0 liters at rest, 0 liters at night, and 0 liters for exercise.  Kimberly Obrien is not currently using cpap/bipap at night.   ? This is a no change result from entrance.    Education: . Kimberly Obrien attended 13/13 education classes.  Kimberly Obrien completed a discharge educational assessment and achieved a score of 12/14.  ? This is a change of 71.4% from entrance.   Smoking Cessation:  N/A  Exercise: Kimberly Obrien was provided with an individualized Home Exercise Prescription (HEP) at the entrance of the program.  The patient's goal is to be exercising 30-60 minutes, 3-5 days per week. Upon discharge the patient is exercising 1-2 days at home.  This is a change of 100% from entrance.  After graduation from Pulmonary Rehab, Kimberly Obrien will continue exercising at home.

## 2015-06-04 DIAGNOSIS — R748 Abnormal levels of other serum enzymes: Secondary | ICD-10-CM | POA: Diagnosis not present

## 2015-06-04 NOTE — Progress Notes (Addendum)
Patient is discharged from [facility] Pulmonary program today, June 01, 2015  with 24 sessions.  She achieved LTG of 30 minutes of aerobic exercise at max met level of 3.65-.  All patient vitals are WNL.  Patient has met with dietician.  Discharge instructions have been reviewed in detail and patient expressed an understanding of material given.  Patient plans to exercise at home. Cardiac Rehab will make 1 month, 6 month and 1 year call backs.  Patient had no complaints of any abnormal S/S or pain on their exit visit.  Discharge PHQ 9 score was 0.  No counseling needed.  Patient finished post walk test.

## 2015-06-07 ENCOUNTER — Encounter: Payer: Self-pay | Admitting: Obstetrics & Gynecology

## 2015-06-07 ENCOUNTER — Ambulatory Visit (INDEPENDENT_AMBULATORY_CARE_PROVIDER_SITE_OTHER): Payer: Commercial Managed Care - HMO | Admitting: Obstetrics & Gynecology

## 2015-06-07 VITALS — BP 90/60 | HR 74 | Ht 66.0 in | Wt 214.0 lb

## 2015-06-07 DIAGNOSIS — R3989 Other symptoms and signs involving the genitourinary system: Secondary | ICD-10-CM

## 2015-06-07 DIAGNOSIS — N3289 Other specified disorders of bladder: Secondary | ICD-10-CM | POA: Diagnosis not present

## 2015-06-07 MED ORDER — FESOTERODINE FUMARATE ER 8 MG PO TB24: ORAL_TABLET | ORAL | Status: DC

## 2015-06-07 NOTE — Progress Notes (Signed)
Patient ID: Kimberly Obrien, female   DOB: [birth year], 54 y.o.   MRN: [id number] Chief Complaint  Patient presents with  . new gyn    pressure in bladder, had sling 2009 at [facility]    Blood pressure 90/60, pulse 74, height 5' 6"  (1.676 m), weight 214 lb (97.07 kg).  54 y.o. No obstetric history on file. No LMP recorded. Patient has had a hysterectomy. The current method of family planning is status post hysterectomy.  Subjective Pt had sling 2009 for [facility]I Has increasing sensation of pressure in the bladder, no incontinence or nocturia  Objective Good bladder support, cervix in situ interestingly, no lesions no abnormalities  Pertinent ROS No burning with urination, frequency or urgency No nausea, vomiting or diarrhea Nor fever chills or other constitutional symptoms   Labs or studies     Impression Diagnoses this Encounter::   ICD-9-CM ICD-10-CM   1. Sensation of pressure in bladder area 596.89 N32.89     Established relevant diagnosis(es): S/p sling 2009  Plan/Recommendations: Meds ordered this encounter  Medications  . predniSONE (DELTASONE) 2.5 MG tablet    Sig: Take 5 mg by mouth daily with breakfast.  . albuterol (PROVENTIL HFA;VENTOLIN HFA) 108 (90 BASE) MCG/ACT inhaler    Sig: Inhale into the lungs every 6 (six) hours as needed for wheezing or shortness of breath.  . fesoterodine (TOVIAZ) 8 MG TB24 tablet    Sig: Take 1 daily at bedtime    Dispense:  30 tablet    Refill:  11    Labs or Scans Ordered: No orders of the defined types were placed in this encounter.      Follow up Return in about 1 month (around 07/08/2015) for Follow up, with Dr Elonda Husky.      All questions were answered.  Past Medical History  Diagnosis Date  . Anticoagulation monitoring by pharmacist   . PE (pulmonary embolism)   . Arthritis   . COPD (chronic obstructive pulmonary disease) ([place])   . Asthma     Past Surgical History  Procedure Laterality Date  . Tubal ligation     . Wrist surgery Right     Had surgery twice to shave bone for blood circulation improvement.  . Cholecystectomy    . Spine surgery      L5 S1 fusion  . Abdominal hysterectomy    . Eye surgery      Cataracts  . Left and right heart catheterization with coronary angiogram N/A 05/13/2014    Procedure: LEFT AND RIGHT HEART CATHETERIZATION WITH CORONARY ANGIOGRAM;  Surgeon: [name], MD;  Location: [facility] CATH LAB;  Service: Cardiovascular;  Laterality: N/A;    OB History    No data available      Allergies  Allergen Reactions  . Aciphex [Rabeprazole Sodium] Rash  . Diflucan [Fluconazole] Rash  . Claritin [Loratadine]     unknown  . Lipitor [Atorvastatin] Other (See Comments)    Myalgias, leg pain.  Problems with pancreas, sugars went up also.  Santiago Bur [Nitrofurantoin Macrocrystal] Nausea And Vomiting  . Metoprolol Nausea And Vomiting  . Nsaids     Upset stomach  . Septra [Sulfamethoxazole-Trimethoprim] Nausea And Vomiting  . Crestor [Rosuvastatin] Other (See Comments)    Elevated blood glucose and abdominal pain  . Doxycycline Rash  . Ibuprofen     Upset stomach  . [place] [Cephalexin] Rash    Social History   Social History  . Marital Status: Married    Spouse Name:  N/A  . Number of Children: N/A  . Years of Education: N/A   Social History Main Topics  . Smoking status: Former Smoker -- 2.00 packs/day for 34 years    Types: Cigarettes    Start date: 08/22/1977    Quit date: 04/21/2012  . Smokeless tobacco: Never Used  . Alcohol Use: No  . Drug Use: No  . Sexual Activity: Not Asked   Other Topics Concern  . None   Social History Narrative    Family History  Problem Relation Age of Onset  . Cirrhosis Mother   . Hyperlipidemia Father

## 2015-06-09 ENCOUNTER — Ambulatory Visit (INDEPENDENT_AMBULATORY_CARE_PROVIDER_SITE_OTHER): Payer: Commercial Managed Care - HMO | Admitting: Pharmacist

## 2015-06-09 DIAGNOSIS — I482 Chronic atrial fibrillation, unspecified: Secondary | ICD-10-CM

## 2015-06-09 DIAGNOSIS — J811 Chronic pulmonary edema: Secondary | ICD-10-CM | POA: Diagnosis not present

## 2015-06-09 LAB — POCT INR: INR: 2.2

## 2015-06-09 NOTE — Patient Instructions (Signed)
Anticoagulation Dose Instructions as of 06/09/2015      Kimberly Obrien Tue Wed Thu Fri Sat   New Dose 2.5 mg 2.5 mg 2.5 mg 2.5 mg 2.5 mg 2.5 mg 2.5 mg    Description        Continue warfarin 5mg  - take 1/2 tablet daily.      INR was 2.2 today

## 2015-06-23 ENCOUNTER — Other Ambulatory Visit: Payer: Commercial Managed Care - HMO

## 2015-06-23 DIAGNOSIS — N390 Urinary tract infection, site not specified: Secondary | ICD-10-CM

## 2015-06-23 NOTE — Progress Notes (Signed)
Lab only- do urine culture per april

## 2015-06-25 LAB — URINE CULTURE: Organism ID, Bacteria: NO GROWTH

## 2015-07-05 DIAGNOSIS — R748 Abnormal levels of other serum enzymes: Secondary | ICD-10-CM | POA: Diagnosis not present

## 2015-07-08 ENCOUNTER — Ambulatory Visit: Payer: Commercial Managed Care - HMO | Admitting: Obstetrics & Gynecology

## 2015-07-12 DIAGNOSIS — I4891 Unspecified atrial fibrillation: Secondary | ICD-10-CM | POA: Diagnosis not present

## 2015-07-12 DIAGNOSIS — J9611 Chronic respiratory failure with hypoxia: Secondary | ICD-10-CM | POA: Diagnosis not present

## 2015-07-12 DIAGNOSIS — R091 Pleurisy: Secondary | ICD-10-CM | POA: Diagnosis not present

## 2015-07-12 DIAGNOSIS — J45909 Unspecified asthma, uncomplicated: Secondary | ICD-10-CM | POA: Diagnosis not present

## 2015-07-13 ENCOUNTER — Telehealth: Payer: Self-pay | Admitting: Family Medicine

## 2015-07-14 NOTE — Telephone Encounter (Signed)
Called pt and told her to come and sigh release for health port to copy. She will come Monday to fill out release/lc

## 2015-07-19 ENCOUNTER — Encounter: Payer: Self-pay | Admitting: Pharmacist

## 2015-07-19 ENCOUNTER — Ambulatory Visit (INDEPENDENT_AMBULATORY_CARE_PROVIDER_SITE_OTHER): Payer: Commercial Managed Care - HMO | Admitting: Pharmacist

## 2015-07-19 VITALS — BP 110/63 | HR 73 | Ht 66.0 in | Wt 215.8 lb

## 2015-07-19 DIAGNOSIS — Z0000 Encounter for general adult medical examination without abnormal findings: Secondary | ICD-10-CM

## 2015-07-19 DIAGNOSIS — I482 Chronic atrial fibrillation, unspecified: Secondary | ICD-10-CM

## 2015-07-19 DIAGNOSIS — J811 Chronic pulmonary edema: Secondary | ICD-10-CM

## 2015-07-19 DIAGNOSIS — K589 Irritable bowel syndrome without diarrhea: Secondary | ICD-10-CM

## 2015-07-19 DIAGNOSIS — R300 Dysuria: Secondary | ICD-10-CM

## 2015-07-19 LAB — POCT INR: INR: 1.9

## 2015-07-19 NOTE — Progress Notes (Signed)
Patient ID: Kimberly Obrien, female   DOB: [birth year], 54 y.o.   MRN: [id number]   Subjective:   Kimberly Obrien is a 54 y.o. married, female who presents for an Initial Medicare Annual Wellness Visit and to have INR rechecked.  Patient is pleasant and in NAD  She voices continued concerns about her shortness of breath and heartrate.  She has seen both a pulmonologist and cardiologist and is in the process of getting supplemental oxygen and also a second opinion from a cardiologist at [facility].   She is also concerned about her bladder mesh.  Was placed in 2009.  She reports that it is keeping her from being able to urinate freely and states that sometimes it take her awhile to urinate.  She has seen a  GYN about this and they prescribed Toviaz but she did not start because of cost and she felt she didn't need because she was not having urine leakage. She asks who she can see to have mesh evaluated and about possibility removing   Current Medications (verified) Outpatient Encounter Prescriptions as of 07/19/2015  Medication Sig  . acetaminophen (TYLENOL) 325 MG tablet Take 650 mg by mouth every 6 (six) hours as needed for mild pain.  Marland Kitchen albuterol (PROVENTIL HFA;VENTOLIN HFA) 108 (90 BASE) MCG/ACT inhaler Inhale into the lungs every 6 (six) hours as needed for wheezing or shortness of breath.  . budesonide-formoterol (SYMBICORT) 80-4.5 MCG/ACT inhaler Inhale 2 puffs into the lungs daily.  . predniSONE (DELTASONE) 2.5 MG tablet Take 5 mg by mouth daily with breakfast.  . warfarin (COUMADIN) 5 MG tablet Take 2.5 mg by mouth daily.  . Aclidinium Bromide (TUDORZA PRESSAIR) 400 MCG/ACT AEPB Inhale 1 puff into the lungs 2 (two) times daily.  . [DISCONTINUED] fesoterodine (TOVIAZ) 8 MG TB24 tablet Take 1 daily at bedtime (Patient not taking: Reported on 06/09/2015)   No facility-administered encounter medications on file as of 07/19/2015.    Allergies (verified) Aciphex; Diflucan;  Claritin; Lipitor; Macrobid; Metoprolol; Nsaids; Septra; Crestor; Doxycycline; Ibuprofen; and Keflet   History: Past Medical History  Diagnosis Date  . Anticoagulation monitoring by pharmacist   . PE (pulmonary embolism)   . Arthritis   . COPD (chronic obstructive pulmonary disease) ([place])   . Asthma    Past Surgical History  Procedure Laterality Date  . Tubal ligation    . Wrist surgery Right     Had surgery twice to shave bone for blood circulation improvement.  . Cholecystectomy    . Spine surgery      L5 S1 fusion  . Abdominal hysterectomy    . Eye surgery      Cataracts  . Left and right heart catheterization with coronary angiogram N/A 05/13/2014    Procedure: LEFT AND RIGHT HEART CATHETERIZATION WITH CORONARY ANGIOGRAM;  Surgeon: [name], MD;  Location: [facility] CATH LAB;  Service: Cardiovascular;  Laterality: N/A;  . Incontinence surgery  2009   Family History  Problem Relation Age of Onset  . Cirrhosis Mother   . Hyperlipidemia Father   . Obesity Daughter   . Hypertension Brother    Social History   Occupational History  . Not on file.   Social History Main Topics  . Smoking status: Former Smoker -- 2.00 packs/day for 34 years    Types: Cigarettes    Start date: 08/22/1977    Quit date: 04/21/2012  . Smokeless tobacco: Never Used  . Alcohol Use: No  . Drug Use:  No  . Sexual Activity: Yes    Do you feel safe at home?  Yes  Dietary issues and exercise activities: Current Exercise Habits:: The patient does not participate in regular exercise at present;Exercise is limited by, Limited by:: respiratory conditions(s)  Current Dietary habits:  She is not following any specific diet   Objective:    Today's Vitals   07/19/15 1555  BP: 110/63  Pulse: 73  Height: 5\' 6"  (1.676 m)  Weight: 215 lb 12.8 oz (97.886 kg)  PainSc: 2    Body mass index is 34.85 kg/(m^2).   INR was 1.9 today  Activities of Daily Living In your present state of health,  do you have any difficulty performing the following activities: 07/19/2015  Hearing? N  Vision? N  Difficulty concentrating or making decisions? N  Walking or climbing stairs? Y  Dressing or bathing? Y  Doing errands, shopping? Y  Preparing Food and eating ? N  Using the Toilet? N  In the past six months, have you accidently leaked urine? N  Do you have problems with loss of bowel control? N  Managing your Medications? N  Managing your Finances? N  Housekeeping or managing your Housekeeping? N    Are there smokers in your home (other than you)? No   Cardiac Risk Factors include: dyslipidemia;family history of premature cardiovascular disease;hypertension;obesity (BMI >30kg/m2);sedentary lifestyle  Depression Screen PHQ 2/9 Scores 07/19/2015 06/01/2015 05/17/2015 03/09/2015  PHQ - 2 Score 0 0 0 0    Fall Risk Fall Risk  07/19/2015 05/17/2015 03/09/2015 01/11/2015 05/15/2014  Falls in the past year? No No No No No    Cognitive Function: MMSE - Mini Mental State Exam 07/19/2015  Orientation to time 5  Orientation to Place 5  Registration 3  Attention/ Calculation 5  Recall 3  Language- name 2 objects 2  Language- repeat 1  Language- follow 3 step command 3  Language- read & follow direction 1  Write a sentence 1  Copy design 1  Total score 30    Immunizations and Health Maintenance  There is no immunization history on file for this patient. There are no preventive care reminders to display for this patient.  Patient Care Team: Claretta Fraise, MD as PCP - General (Family Medicine) Sinda Du, MD as Consulting Physician (Pulmonary Disease) Herminio Commons, MD as Attending Physician (Cardiology) Teena Irani, MD as Consulting Physician (Gastroenterology)  Indicate any recent Medical Services you may have received from other than [facility] providers in the past year (date may be approximate).    Assessment:    Annual Wellness Visit    Screening Tests Health  Maintenance  Topic Date Due  . HIV Screening  10/20/2015 (Originally [birth year]1975)  . INFLUENZA VACCINE  04/21/2016 (Originally 03/22/2015)  . MAMMOGRAM  03/16/2017  . PAP SMEAR  09/16/2017  . TETANUS/TDAP  08/21/2020  . COLONOSCOPY  08/02/2023  . Hepatitis C Screening  Completed        Plan:   During the course of the visit Danean was educated and counseled about the following appropriate screening and preventive services:   Vaccines to include Pneumoccal, Influenza, Hepatitis B, Td, Zostavax - required vaccines UTN  Colorectal cancer screening - done 2014, UTD  Cardiovascular disease screening - UTD   Diabetes screening - UTD  Bone Denisty / Osteoporosis Screening - will get at next appt since patient has taken prednisone for last few months  Mammogram - UTD  PAP - scheduled for 09/2015  Glaucoma screening /  Diabetic Eye Exam - UTD  Nutrition counseling - Discussed limiting serving sizes to help decrease weight / BMI  Encouraged to do chair exercise - handout given  Advanced Directives - copy of AD given and discussed.   Referral to urogynocolocist for evaluation of bladder mesh    Anticoagulation Dose Instructions as of 07/19/2015      Dorene Grebe Tue Wed Thu Fri Sat   New Dose 2.5 mg 2.5 mg 2.5 mg 2.5 mg 2.5 mg 2.5 mg 2.5 mg    Description        Take an extra 1/2 tablet today - Monday, November 28th, then continue warfarin 5mg  - take 1/2 tablet daily.        Patient Instructions (the written plan) were given to the patient.   Cherre Robins, PHARMD, CPP   07/19/2015

## 2015-07-19 NOTE — Patient Instructions (Addendum)
  Kimberly Obrien , Thank you for taking time to come for your Medicare Wellness Visit. I appreciate your ongoing commitment to your health goals. Please review the following plan we discussed and let me know if I can assist you in the future.   These are the goals we discussed: I am sending referral for evaluation by a urogynocologist.  Fill out Advanced directives and return to office Do chair exercises as able.    This is a list of the screening recommended for you and due dates:  Health Maintenance  Topic Date Due  . HIV Screening  10/20/2015*  . Flu Shot  04/21/2016*  . Mammogram  03/16/2017  . Pap Smear  09/16/2017  . Tetanus Vaccine  08/21/2020  . Colon Cancer Screening  08/02/2023  .  Hepatitis C: One time screening is recommended by Center for Disease Control  (CDC) for  adults born from [birth year] through 1965.   Completed  *Topic was postponed. The date shown is not the original due date.   Anticoagulation Dose Instructions as of 07/19/2015      Dorene Grebe Tue Wed Thu Fri Sat   New Dose 2.5 mg 2.5 mg 2.5 mg 2.5 mg 2.5 mg 2.5 mg 2.5 mg    Description        Take an extra 1/2 tablet today - Monday, November 28th, then continue warfarin 5mg  - take 1/2 tablet daily.      INR was 1.9 today

## 2015-07-23 ENCOUNTER — Other Ambulatory Visit: Payer: Self-pay | Admitting: Family Medicine

## 2015-07-30 DIAGNOSIS — R748 Abnormal levels of other serum enzymes: Secondary | ICD-10-CM | POA: Diagnosis not present

## 2015-08-04 DIAGNOSIS — J811 Chronic pulmonary edema: Secondary | ICD-10-CM | POA: Diagnosis not present

## 2015-08-04 DIAGNOSIS — J45909 Unspecified asthma, uncomplicated: Secondary | ICD-10-CM | POA: Diagnosis not present

## 2015-08-17 ENCOUNTER — Ambulatory Visit (INDEPENDENT_AMBULATORY_CARE_PROVIDER_SITE_OTHER): Payer: Commercial Managed Care - HMO | Admitting: Family Medicine

## 2015-08-17 ENCOUNTER — Encounter: Payer: Self-pay | Admitting: Family Medicine

## 2015-08-17 VITALS — BP 109/79 | HR 91 | Temp 98.0°F | Ht 66.0 in | Wt 213.0 lb

## 2015-08-17 DIAGNOSIS — J019 Acute sinusitis, unspecified: Secondary | ICD-10-CM | POA: Diagnosis not present

## 2015-08-17 MED ORDER — AMOXICILLIN 500 MG PO CAPS: 500.0000 mg | ORAL_CAPSULE | Freq: Three times a day (TID) | ORAL | Status: DC

## 2015-08-17 NOTE — Patient Instructions (Signed)
Drink plenty of fluids Stay well hydrated Take Mucinex maximum strength, blue and white in color, 1 twice daily with a large glass of water for cough and congestion Use nasal saline frequently in each nostril Take antibiotic as directed until completed

## 2015-08-17 NOTE — Progress Notes (Signed)
Subjective:    Patient ID: Kimberly Obrien, female    DOB: [birth year], 54 y.o.   MRN: [id number]  HPI Patient here today for congestion, cough, and SOB that started about 4 days ago. The patient has had facial pressure, yellow to white drainage, cough and some shortness of breath and side pain. This is been going on for about 4 days. She has had some sweats but is not sure she's been running any fever or not.      Patient Active Problem List   Diagnosis Date Noted  . IBS (irritable bowel syndrome) 07/19/2015  . Chronic atrial fibrillation ([place]) 01/11/2015  . Tachycardia 12/04/2013  . Fatty liver 05/25/2013  . Vitamin D deficiency 02/04/2013  . History of pulmonary embolism 02/04/2013  . Other fatigue 02/04/2013  . Chronic arthralgias of knees and hips 02/04/2013  . Insomnia 02/04/2013  . GAD (generalized anxiety disorder) 02/04/2013  . HLD (hyperlipidemia) 02/04/2013  . Chronic pulmonary edema 11/21/2012   Outpatient Encounter Prescriptions as of 08/17/2015  Medication Sig  . acetaminophen (TYLENOL) 325 MG tablet Take 650 mg by mouth every 6 (six) hours as needed for mild pain.  . Aclidinium Bromide (TUDORZA PRESSAIR) 400 MCG/ACT AEPB Inhale 1 puff into the lungs 2 (two) times daily.  Marland Kitchen albuterol (PROVENTIL HFA;VENTOLIN HFA) 108 (90 BASE) MCG/ACT inhaler Inhale into the lungs every 6 (six) hours as needed for wheezing or shortness of breath.  . budesonide-formoterol (SYMBICORT) 80-4.5 MCG/ACT inhaler Inhale 2 puffs into the lungs daily.  . predniSONE (DELTASONE) 10 MG tablet Take 2 tablets by mouth daily.  Marland Kitchen warfarin (COUMADIN) 5 MG tablet Take 2.5 mg by mouth daily.  Marland Kitchen warfarin (COUMADIN) 5 MG tablet TAKE ONE-HALF TO ONE TABLET BY MOUTH ONCE DAILY  . [DISCONTINUED] predniSONE (DELTASONE) 2.5 MG tablet Take 5 mg by mouth daily with breakfast.   No facility-administered encounter medications on file as of 08/17/2015.      Review of Systems  Constitutional: Negative.   Fever: unknown.  HENT: Positive for congestion (white - yellow) and sinus pressure (facial pressure and swelling).   Eyes: Negative.   Respiratory: Positive for cough (slight) and shortness of breath.   Cardiovascular: Negative.   Gastrointestinal: Positive for abdominal pain (right side pain).  Endocrine: Negative.   Genitourinary: Negative.   Musculoskeletal: Negative.   Skin: Negative.   Allergic/Immunologic: Negative.   Neurological: Negative.   Hematological: Negative.   Psychiatric/Behavioral: Negative.        Objective:   Physical Exam  Constitutional: She is oriented to person, place, and time. She appears well-developed and well-nourished. No distress.  HENT:  Head: Normocephalic and atraumatic.  Right Ear: External ear normal.  Left Ear: External ear normal.  Mouth/Throat: Oropharynx is clear and moist. No oropharyngeal exudate.  Nasal congestion left greater than right  Eyes: Conjunctivae and EOM are normal. Pupils are equal, round, and reactive to light. Right eye exhibits no discharge. Left eye exhibits no discharge. No scleral icterus.  Neck: Normal range of motion. Neck supple. No JVD present. No thyromegaly present.  Left greater than right anterior cervical adenopathy  Cardiovascular: Normal rate, regular rhythm and normal heart sounds.   No murmur heard. Pulmonary/Chest: Effort normal and breath sounds normal. She has no wheezes. She has no rales.  Dry cough  Abdominal: Soft. Bowel sounds are normal. She exhibits no mass. There is tenderness. There is no rebound and no guarding.  There is generalized tenderness across the upper abdomen  Musculoskeletal: Normal  range of motion. She exhibits no edema.  Lymphadenopathy:    She has cervical adenopathy.  Neurological: She is alert and oriented to person, place, and time.  Skin: Skin is warm and dry. No rash noted.  Psychiatric: She has a normal mood and affect. Her behavior is normal. Judgment and thought content  normal.  Nursing note and vitals reviewed.   BP 109/79 mmHg  Pulse 91  Temp(Src) 98 F (36.7 C) (Oral)  Ht 5\' 6"  (1.676 m)  Wt 213 lb (96.616 kg)  BMI 34.40 kg/m2       Assessment & Plan:  1. Acute rhinosinusitis -Take antibiotic as directed and drink plenty of fluids and use Mucinex and nasal saline  Meds ordered this encounter  Medications  . predniSONE (DELTASONE) 10 MG tablet    Sig: Take 2 tablets by mouth daily.  Marland Kitchen amoxicillin (AMOXIL) 500 MG capsule    Sig: Take 1 capsule (500 mg total) by mouth 3 (three) times daily.    Dispense:  30 capsule    Refill:  0   Patient Instructions  Drink plenty of fluids Stay well hydrated Take Mucinex maximum strength, blue and white in color, 1 twice daily with a large glass of water for cough and congestion Use nasal saline frequently in each nostril Take antibiotic as directed until completed   Arrie Senate MD

## 2015-08-25 ENCOUNTER — Encounter: Payer: Self-pay | Admitting: Pharmacist

## 2015-08-26 ENCOUNTER — Ambulatory Visit (INDEPENDENT_AMBULATORY_CARE_PROVIDER_SITE_OTHER): Payer: Commercial Managed Care - HMO | Admitting: Family Medicine

## 2015-08-26 VITALS — BP 105/66 | HR 88 | Temp 97.0°F | Ht 66.0 in | Wt 213.6 lb

## 2015-08-26 DIAGNOSIS — J0101 Acute recurrent maxillary sinusitis: Secondary | ICD-10-CM | POA: Diagnosis not present

## 2015-08-26 MED ORDER — FLUTICASONE PROPIONATE 50 MCG/ACT NA SUSP: 2.0000 | Freq: Every day | NASAL | Status: DC

## 2015-08-26 MED ORDER — CEFDINIR 300 MG PO CAPS: 300.0000 mg | ORAL_CAPSULE | Freq: Two times a day (BID) | ORAL | Status: DC

## 2015-08-26 NOTE — Patient Instructions (Addendum)
Great to meet you!  I have prescribed omnicef, It is a strong antibiotic, you should eat yogurt daily or take a pro-biotic  Also try flonase once daily for 2 weeks.   Sinusitis, Adult Sinusitis is redness, soreness, and inflammation of the paranasal sinuses. Paranasal sinuses are air pockets within the bones of your face. They are located beneath your eyes, in the middle of your forehead, and above your eyes. In healthy paranasal sinuses, mucus is able to drain out, and air is able to circulate through them by way of your nose. However, when your paranasal sinuses are inflamed, mucus and air can become trapped. This can allow bacteria and other germs to grow and cause infection. Sinusitis can develop quickly and last only a short time (acute) or continue over a long period (chronic). Sinusitis that lasts for more than 12 weeks is considered chronic. CAUSES Causes of sinusitis include:  Allergies.  Structural abnormalities, such as displacement of the cartilage that separates your nostrils (deviated septum), which can decrease the air flow through your nose and sinuses and affect sinus drainage.  Functional abnormalities, such as when the small hairs (cilia) that line your sinuses and help remove mucus do not work properly or are not present. SIGNS AND SYMPTOMS Symptoms of acute and chronic sinusitis are the same. The primary symptoms are pain and pressure around the affected sinuses. Other symptoms include:  Upper toothache.  Earache.  Headache.  Bad breath.  Decreased sense of smell and taste.  A cough, which worsens when you are lying flat.  Fatigue.  Fever.  Thick drainage from your nose, which often is green and may contain pus (purulent).  Swelling and warmth over the affected sinuses. DIAGNOSIS Your health care provider will perform a physical exam. During your exam, your health care provider may perform any of the following to help determine if you have acute sinusitis  or chronic sinusitis:  Look in your nose for signs of abnormal growths in your nostrils (nasal polyps).  Tap over the affected sinus to check for signs of infection.  View the inside of your sinuses using an imaging device that has a light attached (endoscope). If your health care provider suspects that you have chronic sinusitis, one or more of the following tests may be recommended:  Allergy tests.  Nasal culture. A sample of mucus is taken from your nose, sent to a lab, and screened for bacteria.  Nasal cytology. A sample of mucus is taken from your nose and examined by your health care provider to determine if your sinusitis is related to an allergy. TREATMENT Most cases of acute sinusitis are related to a viral infection and will resolve on their own within 10 days. Sometimes, medicines are prescribed to help relieve symptoms of both acute and chronic sinusitis. These may include pain medicines, decongestants, nasal steroid sprays, or saline sprays. However, for sinusitis related to a bacterial infection, your health care provider will prescribe antibiotic medicines. These are medicines that will help kill the bacteria causing the infection. Rarely, sinusitis is caused by a fungal infection. In these cases, your health care provider will prescribe antifungal medicine. For some cases of chronic sinusitis, surgery is needed. Generally, these are cases in which sinusitis recurs more than 3 times per year, despite other treatments. HOME CARE INSTRUCTIONS  Drink plenty of water. Water helps thin the mucus so your sinuses can drain more easily.  Use a humidifier.  Inhale steam 3-4 times a day (for example, sit in  the bathroom with the shower running).  Apply a warm, moist washcloth to your face 3-4 times a day, or as directed by your health care provider.  Use saline nasal sprays to help moisten and clean your sinuses.  Take medicines only as directed by your health care provider.  If  you were prescribed either an antibiotic or antifungal medicine, finish it all even if you start to feel better. SEEK IMMEDIATE MEDICAL CARE IF:  You have increasing pain or severe headaches.  You have nausea, vomiting, or drowsiness.  You have swelling around your face.  You have vision problems.  You have a stiff neck.  You have difficulty breathing.   This information is not intended to replace advice given to you by your health care provider. Make sure you discuss any questions you have with your health care provider.   Document Released: 08/07/2005 Document Revised: 08/28/2014 Document Reviewed: 08/22/2011 Elsevier Interactive Patient Education [employer].

## 2015-08-26 NOTE — Progress Notes (Signed)
   HPI  Patient presents today here today to discuss acute illness.  Patient explains that she's been sick for about 2 weeks, she was seen on December 27 for similar illness. She was treated with prednisone and amoxicillin and did not have improvement. She's describes shortness of breath which is stable long-term, she also has fatigue, worsening cough, and left ear pain. She also complains of bilateral maxillary sinus pressure. She also has nasal congestion and malaise. She has normal food and fluid intolerance.  She is stable chest pain that she states has been going on for several months  PMH: Smoking status noted ROS: Per HPI  Objective: BP 105/66 mmHg  Pulse 88  Temp(Src) 97 F (36.1 C) (Oral)  Ht 5' 6"  (1.676 m)  Wt 213 lb 9.6 oz (96.888 kg)  BMI 34.49 kg/m2 Gen: NAD, alert, cooperative with exam HEENT: NCAT, maxillary sinus tenderness to palpation, TMs normal bilaterally, oropharynx clear CV: RRR, good S1/S2, no murmur  Resp: CTABL, no wheezes, non-labored Ext: No edema, warm Neuro: Alert and oriented, No gross deficits  Assessment and plan:  # Acute maxillary sinusitis Broadening coverage with Omnicef Recommended probiotic or yogurt daily while taking. Follow-up if worsening or does not improve as expected Dyspnea and chest pain syndrome a stable and long-term problems that are not particularly associated with this illness.   Meds ordered this encounter  Medications  . cefdinir (OMNICEF) 300 MG capsule    Sig: Take 1 capsule (300 mg total) by mouth 2 (two) times daily. 1 po BID    Dispense:  20 capsule    Refill:  0  . fluticasone (FLONASE) 50 MCG/ACT nasal spray    Sig: Place 2 sprays into both nostrils daily.    Dispense:  16 g    Refill:  Banks, MD [facility] 08/26/2015, 6:54 PM

## 2015-09-01 ENCOUNTER — Encounter: Payer: Commercial Managed Care - HMO | Admitting: Pharmacist

## 2015-09-02 ENCOUNTER — Encounter: Payer: Commercial Managed Care - HMO | Admitting: Pharmacist

## 2015-09-03 ENCOUNTER — Encounter: Payer: Self-pay | Admitting: Family Medicine

## 2015-09-04 DIAGNOSIS — J45909 Unspecified asthma, uncomplicated: Secondary | ICD-10-CM | POA: Diagnosis not present

## 2015-09-04 DIAGNOSIS — J811 Chronic pulmonary edema: Secondary | ICD-10-CM | POA: Diagnosis not present

## 2015-09-15 ENCOUNTER — Ambulatory Visit (INDEPENDENT_AMBULATORY_CARE_PROVIDER_SITE_OTHER): Payer: Commercial Managed Care - HMO | Admitting: Pharmacist

## 2015-09-15 ENCOUNTER — Encounter: Payer: Self-pay | Admitting: Pharmacist

## 2015-09-15 VITALS — BP 134/67 | HR 94

## 2015-09-15 DIAGNOSIS — I482 Chronic atrial fibrillation, unspecified: Secondary | ICD-10-CM

## 2015-09-15 DIAGNOSIS — J811 Chronic pulmonary edema: Secondary | ICD-10-CM

## 2015-09-15 LAB — POCT INR: INR: 1.8

## 2015-09-15 NOTE — Progress Notes (Signed)
See anticoagulation notes.

## 2015-09-15 NOTE — Patient Instructions (Signed)
Anticoagulation Dose Instructions as of 09/15/2015      Dorene Grebe Tue Wed Thu Fri Sat   New Dose 2.5 mg 2.5 mg 2.5 mg 2.5 mg 2.5 mg 2.5 mg 2.5 mg    Description        Take 1 tablet today - Wednesday, January 25th.  Then continue warfarin 5mg  - take 1/2 tablet daily.      INR was 1.8 today

## 2015-09-21 ENCOUNTER — Ambulatory Visit (INDEPENDENT_AMBULATORY_CARE_PROVIDER_SITE_OTHER): Payer: Commercial Managed Care - HMO | Admitting: Urology

## 2015-09-21 DIAGNOSIS — R102 Pelvic and perineal pain: Secondary | ICD-10-CM | POA: Diagnosis not present

## 2015-09-21 DIAGNOSIS — R300 Dysuria: Secondary | ICD-10-CM

## 2015-09-23 ENCOUNTER — Ambulatory Visit (INDEPENDENT_AMBULATORY_CARE_PROVIDER_SITE_OTHER): Payer: Commercial Managed Care - HMO

## 2015-09-23 ENCOUNTER — Encounter: Payer: Self-pay | Admitting: Family

## 2015-09-23 ENCOUNTER — Ambulatory Visit (INDEPENDENT_AMBULATORY_CARE_PROVIDER_SITE_OTHER): Payer: Commercial Managed Care - HMO | Admitting: Family

## 2015-09-23 VITALS — BP 133/72 | HR 102 | Temp 97.6°F | Ht 66.0 in | Wt 215.0 lb

## 2015-09-23 DIAGNOSIS — F411 Generalized anxiety disorder: Secondary | ICD-10-CM

## 2015-09-23 DIAGNOSIS — K754 Autoimmune hepatitis: Secondary | ICD-10-CM | POA: Diagnosis not present

## 2015-09-23 DIAGNOSIS — E785 Hyperlipidemia, unspecified: Secondary | ICD-10-CM

## 2015-09-23 DIAGNOSIS — I482 Chronic atrial fibrillation, unspecified: Secondary | ICD-10-CM

## 2015-09-23 DIAGNOSIS — J449 Chronic obstructive pulmonary disease, unspecified: Secondary | ICD-10-CM | POA: Insufficient documentation

## 2015-09-23 DIAGNOSIS — R000 Tachycardia, unspecified: Secondary | ICD-10-CM | POA: Diagnosis not present

## 2015-09-23 DIAGNOSIS — Z01419 Encounter for gynecological examination (general) (routine) without abnormal findings: Secondary | ICD-10-CM

## 2015-09-23 DIAGNOSIS — Z0000 Encounter for general adult medical examination without abnormal findings: Secondary | ICD-10-CM | POA: Diagnosis not present

## 2015-09-23 DIAGNOSIS — Z780 Asymptomatic menopausal state: Secondary | ICD-10-CM

## 2015-09-23 DIAGNOSIS — K589 Irritable bowel syndrome without diarrhea: Secondary | ICD-10-CM

## 2015-09-23 DIAGNOSIS — E559 Vitamin D deficiency, unspecified: Secondary | ICD-10-CM

## 2015-09-23 DIAGNOSIS — G4700 Insomnia, unspecified: Secondary | ICD-10-CM | POA: Diagnosis not present

## 2015-09-23 DIAGNOSIS — Z86711 Personal history of pulmonary embolism: Secondary | ICD-10-CM

## 2015-09-23 NOTE — Progress Notes (Addendum)
Subjective:    Patient ID: Kimberly Obrien, female    DOB: 11-22-1960, 55 y.o.   MRN: [id number]  Pt presents to the office today for CPE with pap. Pt has a history of two different pulmonary embolism and is taking warfarin. Pt is followed by our Clinical Pharmacists. Pt is followed by GI for autoimmune Hepatitis every 3 months.  Gynecologic Exam Pertinent negatives include no headaches.  Hyperlipidemia This is a chronic problem. The current episode started more than 1 year ago. The problem is uncontrolled. Recent lipid tests were reviewed and are high. Pertinent negatives include no leg pain or shortness of breath. Current antihyperlipidemic treatment includes diet change. The current treatment provides no improvement of lipids. Compliance problems include adherence to diet and adherence to exercise.  Risk factors for coronary artery disease include dyslipidemia, obesity, post-menopausal, a sedentary lifestyle and family history.  COPD PT currently taking Symbicort daily and albuterol as needed. Pt states she is using the albuterol maybe a few times a week. PT denies smoking at this time and states quit August 2013.    Review of Systems  Constitutional: Negative.   HENT: Negative.   Eyes: Negative.   Respiratory: Negative.  Negative for shortness of breath.   Cardiovascular: Negative.  Negative for palpitations.  Gastrointestinal: Negative.   Endocrine: Negative.   Genitourinary: Negative.   Musculoskeletal: Negative.   Neurological: Negative.  Negative for headaches.  Hematological: Negative.   Psychiatric/Behavioral: Negative.   All other systems reviewed and are negative.      Objective:   Physical Exam  Constitutional: She is oriented to person, place, and time. She appears well-developed and well-nourished. No distress.  HENT:  Head: Normocephalic and atraumatic.  Right Ear: External ear normal.  Left Ear: External ear normal.  Mouth/Throat: Oropharynx is clear and  moist. Dental caries present.  Nasal passage erythemas with mild swelling    Eyes: Pupils are equal, round, and reactive to light.  Neck: Normal range of motion. Neck supple. No thyromegaly present.  Cardiovascular: Normal rate, regular rhythm, normal heart sounds and intact distal pulses.   No murmur heard. Pulmonary/Chest: Effort normal and breath sounds normal. No respiratory distress. She has no wheezes. Right breast exhibits no inverted nipple, no mass, no nipple discharge, no skin change and no tenderness. Left breast exhibits no inverted nipple, no mass, no nipple discharge, no skin change and no tenderness. Breasts are symmetrical.  Abdominal: Soft. Bowel sounds are normal. She exhibits no distension. There is no tenderness.  Genitourinary: Vagina normal.  Bimanual exam- no adnexal masses or tenderness, ovaries nonpalpable   Cervix parous and pink- No discharge   Musculoskeletal: Normal range of motion. She exhibits no edema or tenderness.  Neurological: She is alert and oriented to person, place, and time. She has normal reflexes. No cranial nerve deficit.  Skin: Skin is warm and dry.  Psychiatric: She has a normal mood and affect. Her behavior is normal. Judgment and thought content normal.  Vitals reviewed.   BP 133/72 mmHg  Pulse 102  Temp(Src) 97.6 F (36.4 C) (Oral)  Ht '5\' 6"'  (1.676 m)  Wt 215 lb (97.523 kg)  BMI 34.72 kg/m2       Assessment & Plan:  1. Chronic atrial fibrillation (HCC)  - CMP14+EGFR  2. IBS (irritable bowel syndrome) - CMP14+EGFR  3. Tachycardia  - CMP14+EGFR  4. Vitamin D deficiency  - CMP14+EGFR - VITAMIN D 25 Hydroxy (Vit-D Deficiency, Fractures)  5. Insomnia  - CMP14+EGFR  6. GAD (generalized anxiety disorder) - CMP14+EGFR  7. HLD (hyperlipidemia) -Pt states she can not be on cholesterol medications because of her liver - CMP14+EGFR - Lipid panel  8. History of pulmonary embolism  - CMP14+EGFR  9. Annual physical  exam - Anemia Profile B - CMP14+EGFR - Lipid panel - Thyroid Panel With TSH - VITAMIN D 25 Hydroxy (Vit-D Deficiency, Fractures) - Pap IG w/ reflex to HPV when ASC-U  10. Encounter for routine gynecological examination - CMP14+EGFR - Pap IG w/ reflex to HPV when ASC-U  11. Chronic obstructive pulmonary disease, unspecified COPD type ([place])  12. Autoimmune hepatitis ([place])  13. Post menopause Bone Density Scan  Continue all meds Labs pending Health Maintenance reviewed Diet and exercise encouraged RTO 6 months  Evelina Dun, FNP

## 2015-09-23 NOTE — Addendum Note (Signed)
Addended by: Earlene Plater on: 09/23/2015 04:15 PM   Modules accepted: Miquel Dunn

## 2015-09-23 NOTE — Patient Instructions (Signed)
Health Maintenance, Female Adopting a healthy lifestyle and getting preventive care can go a long way to promote health and wellness. Talk with your health care provider about what schedule of regular examinations is right for you. This is a good chance for you to check in with your provider about disease prevention and staying healthy. In between checkups, there are plenty of things you can do on your own. Experts have done a lot of research about which lifestyle changes and preventive measures are most likely to keep you healthy. Ask your health care provider for more information. WEIGHT AND DIET  Eat a healthy diet  Be sure to include plenty of vegetables, fruits, low-fat dairy products, and lean protein.  Do not eat a lot of foods high in solid fats, added sugars, or salt.  Get regular exercise. This is one of the most important things you can do for your health.  Most adults should exercise for at least 150 minutes each week. The exercise should increase your heart rate and make you sweat (moderate-intensity exercise).  Most adults should also do strengthening exercises at least twice a week. This is in addition to the moderate-intensity exercise.  Maintain a healthy weight  Body mass index (BMI) is a measurement that can be used to identify possible weight problems. It estimates body fat based on height and weight. Your health care provider can help determine your BMI and help you achieve or maintain a healthy weight.  For females 20 years of age and older:   A BMI below 18.5 is considered underweight.  A BMI of 18.5 to 24.9 is normal.  A BMI of 25 to 29.9 is considered overweight.  A BMI of 30 and above is considered obese.  Watch levels of cholesterol and blood lipids  You should start having your blood tested for lipids and cholesterol at 55 years of age, then have this test every 5 years.  You may need to have your cholesterol levels checked more often if:  Your lipid  or cholesterol levels are high.  You are older than 55 years of age.  You are at high risk for heart disease.  CANCER SCREENING   Lung Cancer  Lung cancer screening is recommended for adults 55-80 years old who are at high risk for lung cancer because of a history of smoking.  A yearly low-dose CT scan of the lungs is recommended for people who:  Currently smoke.  Have quit within the past 15 years.  Have at least a 30-pack-year history of smoking. A pack year is smoking an average of one pack of cigarettes a day for 1 year.  Yearly screening should continue until it has been 15 years since you quit.  Yearly screening should stop if you develop a health problem that would prevent you from having lung cancer treatment.  Breast Cancer  Practice breast self-awareness. This means understanding how your breasts normally appear and feel.  It also means doing regular breast self-exams. Let your health care provider know about any changes, no matter how small.  If you are in your 20s or 30s, you should have a clinical breast exam (CBE) by a health care provider every 1-3 years as part of a regular health exam.  If you are 40 or older, have a CBE every year. Also consider having a breast X-ray (mammogram) every year.  If you have a family history of breast cancer, talk to your health care provider about genetic screening.  If you   are at high risk for breast cancer, talk to your health care provider about having an MRI and a mammogram every year.  Breast cancer gene (BRCA) assessment is recommended for women who have family members with BRCA-related cancers. BRCA-related cancers include:  Breast.  Ovarian.  Tubal.  Peritoneal cancers.  Results of the assessment will determine the need for genetic counseling and BRCA1 and BRCA2 testing. Cervical Cancer Your health care provider may recommend that you be screened regularly for cancer of the pelvic organs (ovaries, uterus, and  vagina). This screening involves a pelvic examination, including checking for microscopic changes to the surface of your cervix (Pap test). You may be encouraged to have this screening done every 3 years, beginning at age 21.  For women ages 30-65, health care providers may recommend pelvic exams and Pap testing every 3 years, or they may recommend the Pap and pelvic exam, combined with testing for human papilloma virus (HPV), every 5 years. Some types of HPV increase your risk of cervical cancer. Testing for HPV may also be done on women of any age with unclear Pap test results.  Other health care providers may not recommend any screening for nonpregnant women who are considered low risk for pelvic cancer and who do not have symptoms. Ask your health care provider if a screening pelvic exam is right for you.  If you have had past treatment for cervical cancer or a condition that could lead to cancer, you need Pap tests and screening for cancer for at least 20 years after your treatment. If Pap tests have been discontinued, your risk factors (such as having a new sexual partner) need to be reassessed to determine if screening should resume. Some women have medical problems that increase the chance of getting cervical cancer. In these cases, your health care provider may recommend more frequent screening and Pap tests. Colorectal Cancer  This type of cancer can be detected and often prevented.  Routine colorectal cancer screening usually begins at 55 years of age and continues through 55 years of age.  Your health care provider may recommend screening at an earlier age if you have risk factors for colon cancer.  Your health care provider may also recommend using home test kits to check for hidden blood in the stool.  A small camera at the end of a tube can be used to examine your colon directly (sigmoidoscopy or colonoscopy). This is done to check for the earliest forms of colorectal  cancer.  Routine screening usually begins at age 50.  Direct examination of the colon should be repeated every 5-10 years through 55 years of age. However, you may need to be screened more often if early forms of precancerous polyps or small growths are found. Skin Cancer  Check your skin from head to toe regularly.  Tell your health care provider about any new moles or changes in moles, especially if there is a change in a mole's shape or color.  Also tell your health care provider if you have a mole that is larger than the size of a pencil eraser.  Always use sunscreen. Apply sunscreen liberally and repeatedly throughout the day.  Protect yourself by wearing long sleeves, pants, a wide-brimmed hat, and sunglasses whenever you are outside. HEART DISEASE, DIABETES, AND HIGH BLOOD PRESSURE   High blood pressure causes heart disease and increases the risk of stroke. High blood pressure is more likely to develop in:  People who have blood pressure in the high end   of the normal range (130-139/85-89 mm Hg).  People who are overweight or obese.  People who are African American.  If you are 38-23 years of age, have your blood pressure checked every 3-5 years. If you are 61 years of age or older, have your blood pressure checked every year. You should have your blood pressure measured twice--once when you are at a hospital or clinic, and once when you are not at a hospital or clinic. Record the average of the two measurements. To check your blood pressure when you are not at a hospital or clinic, you can use:  An automated blood pressure machine at a pharmacy.  A home blood pressure monitor.  If you are between 45 years and 39 years old, ask your health care provider if you should take aspirin to prevent strokes.  Have regular diabetes screenings. This involves taking a blood sample to check your fasting blood sugar level.  If you are at a normal weight and have a low risk for diabetes,  have this test once every three years after 55 years of age.  If you are overweight and have a high risk for diabetes, consider being tested at a younger age or more often. PREVENTING INFECTION  Hepatitis B  If you have a higher risk for hepatitis B, you should be screened for this virus. You are considered at high risk for hepatitis B if:  You were born in a country where hepatitis B is common. Ask your health care provider which countries are considered high risk.  Your parents were born in a high-risk country, and you have not been immunized against hepatitis B (hepatitis B vaccine).  You have HIV or AIDS.  You use needles to inject street drugs.  You live with someone who has hepatitis B.  You have had sex with someone who has hepatitis B.  You get hemodialysis treatment.  You take certain medicines for conditions, including cancer, organ transplantation, and autoimmune conditions. Hepatitis C  Blood testing is recommended for:  Everyone born from [birth year] through 1965.  Anyone with known risk factors for hepatitis C. Sexually transmitted infections (STIs)  You should be screened for sexually transmitted infections (STIs) including gonorrhea and chlamydia if:  You are sexually active and are younger than 55 years of age.  You are older than 55 years of age and your health care provider tells you that you are at risk for this type of infection.  Your sexual activity has changed since you were last screened and you are at an increased risk for chlamydia or gonorrhea. Ask your health care provider if you are at risk.  If you do not have HIV, but are at risk, it may be recommended that you take a prescription medicine daily to prevent HIV infection. This is called pre-exposure prophylaxis (PrEP). You are considered at risk if:  You are sexually active and do not regularly use condoms or know the HIV status of your partner(s).  You take drugs by injection.  You are sexually  active with a partner who has HIV. Talk with your health care provider about whether you are at high risk of being infected with HIV. If you choose to begin PrEP, you should first be tested for HIV. You should then be tested every 3 months for as long as you are taking PrEP.  PREGNANCY   If you are premenopausal and you may become pregnant, ask your health care provider about preconception counseling.  If you may  become pregnant, take 400 to 800 micrograms (mcg) of folic acid every day.  If you want to prevent pregnancy, talk to your health care provider about birth control (contraception). OSTEOPOROSIS AND MENOPAUSE   Osteoporosis is a disease in which the bones lose minerals and strength with aging. This can result in serious bone fractures. Your risk for osteoporosis can be identified using a bone density scan.  If you are 61 years of age or older, or if you are at risk for osteoporosis and fractures, ask your health care provider if you should be screened.  Ask your health care provider whether you should take a calcium or vitamin D supplement to lower your risk for osteoporosis.  Menopause may have certain physical symptoms and risks.  Hormone replacement therapy may reduce some of these symptoms and risks. Talk to your health care provider about whether hormone replacement therapy is right for you.  HOME CARE INSTRUCTIONS   Schedule regular health, dental, and eye exams.  Stay current with your immunizations.   Do not use any tobacco products including cigarettes, chewing tobacco, or electronic cigarettes.  If you are pregnant, do not drink alcohol.  If you are breastfeeding, limit how much and how often you drink alcohol.  Limit alcohol intake to no more than 1 drink per day for nonpregnant women. One drink equals 12 ounces of beer, 5 ounces of wine, or 1 ounces of hard liquor.  Do not use street drugs.  Do not share needles.  Ask your health care provider for help if  you need support or information about quitting drugs.  Tell your health care provider if you often feel depressed.  Tell your health care provider if you have ever been abused or do not feel safe at home.   This information is not intended to replace advice given to you by your health care provider. Make sure you discuss any questions you have with your health care provider.   Document Released: 02/20/2011 Document Revised: 08/28/2014 Document Reviewed: 07/09/2013 Elsevier Interactive Patient Education [employer].

## 2015-09-23 NOTE — Addendum Note (Signed)
Addended by: Evelina Dun A on: 09/23/2015 03:59 PM   Modules accepted: Orders, SmartSet

## 2015-09-24 LAB — CMP14+EGFR
ALT: 119 IU/L — ABNORMAL HIGH (ref 0–32)
AST: 71 IU/L — ABNORMAL HIGH (ref 0–40)
Albumin/Globulin Ratio: 2 (ref 1.1–2.5)
Albumin: 4.2 g/dL (ref 3.5–5.5)
Alkaline Phosphatase: 76 IU/L (ref 39–117)
BUN/Creatinine Ratio: 13 (ref 9–23)
BUN: 10 mg/dL (ref 6–24)
Bilirubin Total: 0.8 mg/dL (ref 0.0–1.2)
CO2: 23 mmol/L (ref 18–29)
Calcium: 9.3 mg/dL (ref 8.7–10.2)
Chloride: 101 mmol/L (ref 96–106)
Creatinine, Ser: 0.79 mg/dL (ref 0.57–1.00)
GFR calc Af Amer: 97 mL/min/{1.73_m2} (ref >59)
GFR calc non Af Amer: 85 mL/min/{1.73_m2} (ref >59)
Globulin, Total: 2.1 g/dL (ref 1.5–4.5)
Glucose: 163 mg/dL — ABNORMAL HIGH (ref 65–99)
Potassium: 4.2 mmol/L (ref 3.5–5.2)
Sodium: 143 mmol/L (ref 134–144)
Total Protein: 6.3 g/dL (ref 6.0–8.5)

## 2015-09-24 LAB — LIPID PANEL
Chol/HDL Ratio: 3.8 ratio units (ref 0.0–4.4)
Cholesterol, Total: 192 mg/dL (ref 100–199)
HDL: 51 mg/dL (ref >39)
LDL Calculated: 108 mg/dL — ABNORMAL HIGH (ref 0–99)
Triglycerides: 165 mg/dL — ABNORMAL HIGH (ref 0–149)
VLDL Cholesterol Cal: 33 mg/dL (ref 5–40)

## 2015-09-24 LAB — ANEMIA PROFILE B
Basophils Absolute: 0 10*3/uL (ref 0.0–0.2)
Basos: 0 %
EOS (ABSOLUTE): 0 10*3/uL (ref 0.0–0.4)
Eos: 0 %
Ferritin: 400 ng/mL — ABNORMAL HIGH (ref 15–150)
Folate: 11.9 ng/mL (ref >3.0)
Hematocrit: 42.1 % (ref 34.0–46.6)
Hemoglobin: 14.8 g/dL (ref 11.1–15.9)
Immature Grans (Abs): 0 10*3/uL (ref 0.0–0.1)
Immature Granulocytes: 0 %
Iron Saturation: 25 % (ref 15–55)
Iron: 74 ug/dL (ref 27–159)
Lymphocytes Absolute: 0.9 10*3/uL (ref 0.7–3.1)
Lymphs: 7 %
MCH: 29.6 pg (ref 26.6–33.0)
MCHC: 35.2 g/dL (ref 31.5–35.7)
MCV: 84 fL (ref 79–97)
Monocytes Absolute: 0.2 10*3/uL (ref 0.1–0.9)
Monocytes: 2 %
Neutrophils Absolute: 11.3 10*3/uL — ABNORMAL HIGH (ref 1.4–7.0)
Neutrophils: 91 %
Platelets: 177 10*3/uL (ref 150–379)
RBC: 5 x10E6/uL (ref 3.77–5.28)
RDW: 14.4 % (ref 12.3–15.4)
Retic Ct Pct: 2.4 % (ref 0.6–2.6)
Total Iron Binding Capacity: 301 ug/dL (ref 250–450)
UIBC: 227 ug/dL (ref 131–425)
Vitamin B-12: 402 pg/mL (ref 211–946)
WBC: 12.4 10*3/uL — ABNORMAL HIGH (ref 3.4–10.8)

## 2015-09-24 LAB — THYROID PANEL WITH TSH
Free Thyroxine Index: 2.4 (ref 1.2–4.9)
T3 Uptake Ratio: 27 % (ref 24–39)
T4, Total: 8.9 ug/dL (ref 4.5–12.0)
TSH: 0.441 u[IU]/mL — ABNORMAL LOW (ref 0.450–4.500)

## 2015-09-24 LAB — VITAMIN D 25 HYDROXY (VIT D DEFICIENCY, FRACTURES): Vit D, 25-Hydroxy: 17.3 ng/mL — ABNORMAL LOW (ref 30.0–100.0)

## 2015-09-27 ENCOUNTER — Other Ambulatory Visit: Payer: Self-pay | Admitting: Family

## 2015-09-27 LAB — PAP IG W/ RFLX HPV ASCU: PAP Smear Comment: 0

## 2015-09-27 MED ORDER — VITAMIN D (ERGOCALCIFEROL) 1.25 MG (50000 UNIT) PO CAPS: 50000.0000 [IU] | ORAL_CAPSULE | ORAL | Status: DC

## 2015-09-27 NOTE — Addendum Note (Signed)
Addended by: Pollyann Kennedy F on: 09/27/2015 03:50 PM   Modules accepted: Orders, SmartSet

## 2015-09-28 ENCOUNTER — Other Ambulatory Visit: Payer: Commercial Managed Care - HMO

## 2015-09-28 LAB — POCT GLYCOSYLATED HEMOGLOBIN (HGB A1C): Hemoglobin A1C: 5.6

## 2015-09-28 NOTE — Progress Notes (Signed)
Lab only 

## 2015-10-05 DIAGNOSIS — J811 Chronic pulmonary edema: Secondary | ICD-10-CM | POA: Diagnosis not present

## 2015-10-05 DIAGNOSIS — J45909 Unspecified asthma, uncomplicated: Secondary | ICD-10-CM | POA: Diagnosis not present

## 2015-10-11 DIAGNOSIS — I4891 Unspecified atrial fibrillation: Secondary | ICD-10-CM | POA: Diagnosis not present

## 2015-10-11 DIAGNOSIS — J811 Chronic pulmonary edema: Secondary | ICD-10-CM | POA: Diagnosis not present

## 2015-10-11 DIAGNOSIS — J45909 Unspecified asthma, uncomplicated: Secondary | ICD-10-CM | POA: Diagnosis not present

## 2015-10-11 DIAGNOSIS — J9611 Chronic respiratory failure with hypoxia: Secondary | ICD-10-CM | POA: Diagnosis not present

## 2015-10-12 ENCOUNTER — Encounter: Payer: Self-pay | Admitting: Cardiovascular Disease

## 2015-10-12 ENCOUNTER — Ambulatory Visit (INDEPENDENT_AMBULATORY_CARE_PROVIDER_SITE_OTHER): Payer: Commercial Managed Care - HMO | Admitting: Cardiovascular Disease

## 2015-10-12 VITALS — BP 108/78 | HR 70 | Ht 66.0 in | Wt 217.8 lb

## 2015-10-12 DIAGNOSIS — R0602 Shortness of breath: Secondary | ICD-10-CM

## 2015-10-12 DIAGNOSIS — R748 Abnormal levels of other serum enzymes: Secondary | ICD-10-CM | POA: Diagnosis not present

## 2015-10-12 DIAGNOSIS — R002 Palpitations: Secondary | ICD-10-CM | POA: Diagnosis not present

## 2015-10-12 DIAGNOSIS — F17201 Nicotine dependence, unspecified, in remission: Secondary | ICD-10-CM | POA: Diagnosis not present

## 2015-10-12 NOTE — Progress Notes (Signed)
Chief Complaint  Patient presents with  . New Evaluation    pt c/o SOB   History of Present Illness: 55 yo female with history of COPD/asthma, arthritis and pulmonary embolism who is here today to establish in our [place] office. She has been followed by Dr. Jacinta Shoe in the [facility] Cardiology office in [facility]. She has been evaluated over the years for tachycardia/palpitations/fatigue/dyspnea. Echo showed normal LV systolic and diastolic function with no valve issues. Right and left heart cath 2015 with no evidence of CAD and normal PA pressures. Cardiac monitor 2016 with rare PVCS, PACs. CTA chest 2015 with no PE. Her first large pulmonary embolism was reportedly in December 1999 with 3 small pulmonary emboli in 2003. She is on coumadin. She completed 24 weeks of cardiac rehab this year.   She tells me that she has continued feelings of her heart racing after meals and with exertion. She can walk to the mailbox and she feels her heart pounding. She has associated dyspnea. She is trying to exercise. She does have mild COPD. She stopped smoking in 2013. She has no chest pain.   Primary Care Physician: Claretta Fraise, MD [facility])   Past Medical History  Diagnosis Date  . Anticoagulation monitoring by pharmacist   . PE (pulmonary embolism)   . Arthritis   . COPD (chronic obstructive pulmonary disease) ([place])   . Asthma   . Autoimmune hepatitis [facility])     Past Surgical History  Procedure Laterality Date  . Tubal ligation    . Wrist surgery Right     Had surgery twice to shave bone for blood circulation improvement.  . Cholecystectomy    . Spine surgery      L5 S1 fusion  . Abdominal hysterectomy    . Eye surgery      Cataracts  . Left and right heart catheterization with coronary angiogram N/A 05/13/2014    Procedure: LEFT AND RIGHT HEART CATHETERIZATION WITH CORONARY ANGIOGRAM;  Surgeon: [name], MD;  Location: [facility] CATH LAB;  Service: Cardiovascular;   Laterality: N/A;  . Incontinence surgery  2009    Current Outpatient Prescriptions  Medication Sig Dispense Refill  . acetaminophen (TYLENOL) 325 MG tablet Take 650 mg by mouth every 6 (six) hours as needed for mild pain.    . Aclidinium Bromide (TUDORZA PRESSAIR) 400 MCG/ACT AEPB Inhale 1 puff into the lungs 2 (two) times daily.    Marland Kitchen albuterol (PROVENTIL HFA;VENTOLIN HFA) 108 (90 BASE) MCG/ACT inhaler Inhale into the lungs every 6 (six) hours as needed for wheezing or shortness of breath.    . budesonide-formoterol (SYMBICORT) 80-4.5 MCG/ACT inhaler Inhale 2 puffs into the lungs daily. 1 Inhaler 0  . fluticasone (FLONASE) 50 MCG/ACT nasal spray Place 2 sprays into both nostrils daily. 16 g 6  . predniSONE (DELTASONE) 10 MG tablet Take 2 tablets by mouth daily.    . Vitamin D, Ergocalciferol, (DRISDOL) 50000 units CAPS capsule Take 1 capsule (50,000 Units total) by mouth every 7 (seven) days. 12 capsule 3  . warfarin (COUMADIN) 5 MG tablet TAKE ONE-HALF TO ONE TABLET BY MOUTH ONCE DAILY 50 tablet 2   No current facility-administered medications for this visit.    Allergies  Allergen Reactions  . Aciphex [Rabeprazole Sodium] Rash  . Diflucan [Fluconazole] Rash  . Claritin [Loratadine]     unknown  . Lipitor [Atorvastatin] Other (See Comments)    Myalgias, leg pain.  Problems with pancreas, sugars went up also.  Santiago Bur [  Nitrofurantoin Macrocrystal] Nausea And Vomiting  . Metoprolol Nausea And Vomiting  . Nsaids     Upset stomach  . Septra [Sulfamethoxazole-Trimethoprim] Nausea And Vomiting  . Crestor [Rosuvastatin] Other (See Comments)    Elevated blood glucose and abdominal pain  . Doxycycline Rash  . Ibuprofen     Upset stomach  . Keflet [Cephalexin] Rash    Pt states that she is not allergic to this and states she has took it before with no problem. 08/26/15    Social History   Social History  . Marital Status: Married    Spouse Name: N/A  . Number of Children: 4  .  Years of Education: N/A   Occupational History  . Unemployed    Social History Main Topics  . Smoking status: Former Smoker -- 2.00 packs/day for 34 years    Types: Cigarettes    Start date: 08/22/1977    Quit date: 04/21/2012  . Smokeless tobacco: Never Used  . Alcohol Use: No  . Drug Use: No  . Sexual Activity: Yes   Other Topics Concern  . Not on file   Social History Narrative    Family History  Problem Relation Age of Onset  . Cirrhosis Mother   . Hyperlipidemia Father   . Obesity Daughter   . Hypertension Brother   . Heart failure Father     Review of Systems:  As stated in the HPI and otherwise negative.   BP 108/78 mmHg  Pulse 70  Ht 5' 6"  (1.676 m)  Wt 217 lb 12.8 oz (98.793 kg)  BMI 35.17 kg/m2  Physical Examination: General: Well developed, well nourished, NAD HEENT: OP clear, mucus membranes moist SKIN: warm, dry. No rashes. Neuro: No focal deficits Musculoskeletal: Muscle strength 5/5 all ext Psychiatric: Mood and affect normal Neck: No JVD, no carotid bruits, no thyromegaly, no lymphadenopathy. Lungs:Clear bilaterally, no wheezes, rhonci, crackles Cardiovascular: Regular rate and rhythm. No murmurs, gallops or rubs. Abdomen:Soft. Bowel sounds present. Non-tender.  Extremities: No lower extremity edema. Pulses are 2 + in the bilateral DP/PT.  EKG:  EKG is ordered today. The ekg ordered today demonstrates NSR, rate 67 bpm.   Recent Labs: 12/21/2014: Hemoglobin 15.1 09/23/2015: ALT 119*; BUN 10; Creatinine, Ser 0.79; Platelets 177; Potassium 4.2; Sodium 143; TSH 0.441*   Lipid Panel    Component Value Date/Time   CHOL 192 09/23/2015 1615   CHOL 206* 12/21/2014 0000   CHOL 208* 02/04/2013 1317   TRIG 165* 09/23/2015 1615   TRIG 110 12/21/2014 0000   TRIG 136 02/04/2013 1317   HDL 51 09/23/2015 1615   HDL 60 12/21/2014 0000   HDL 38 08/04/2013   HDL 40 02/04/2013 1317   CHOLHDL 3.8 09/23/2015 1615   LDLCALC 108* 09/23/2015 1615   LDLCALC  65 08/04/2013 1332   LDLCALC 65 08/04/2013   LDLCALC 141* 02/04/2013 1317     Wt Readings from Last 3 Encounters:  10/12/15 217 lb 12.8 oz (98.793 kg)  09/23/15 215 lb (97.523 kg)  08/26/15 213 lb 9.6 oz (96.888 kg)     Other studies Reviewed: Additional studies/ records that were reviewed today include: . Review of the above records demonstrates:    Assessment and Plan:   1. Dyspnea: I have personally reviewed her echo images, cath images, EKG and holter report today. I suspect that her dyspnea is related to obesity, deconditioning and underlying lung disease. She has normal LV systolic function on echo in 2015. There is no valve disease. Right  heart cath with normal pressures. Left heart cath with no evidence of CAD. I have encouraged weight loss and daily exercise. I have spent 15 minutes of the visit on counseling regarding weight loss, diet and exercise.   2. Palpitations: Likely due to premature beats. I think her tachycardia with exertion is due to appropriate heart rate response to exertion given her deconditioned states. Will not start any therapy at this time. She will call with worsened symptoms.   3. Obesity: We discussed weight loss at length today. She has a plan to eat better and lose weight.  4. Former tobacco abuse: She stopped smoking in 2013.   Current medicines are reviewed at length with the patient today.  The patient does not have concerns regarding medicines.  The following changes have been made:  no change  Labs/ tests ordered today include: None  Orders Placed This Encounter  Procedures  . EKG 12-Lead     Disposition:   FU with me in 12  months   Signed, Lauree Chandler, MD 10/12/2015 11:42 AM    [facility] [place], [place], [place]  [address] Phone: [phone]; Fax: [phone]

## 2015-10-12 NOTE — Patient Instructions (Signed)

## 2015-10-20 ENCOUNTER — Ambulatory Visit (INDEPENDENT_AMBULATORY_CARE_PROVIDER_SITE_OTHER): Payer: Commercial Managed Care - HMO | Admitting: Pharmacist

## 2015-10-20 DIAGNOSIS — I482 Chronic atrial fibrillation, unspecified: Secondary | ICD-10-CM

## 2015-10-20 DIAGNOSIS — J811 Chronic pulmonary edema: Secondary | ICD-10-CM | POA: Diagnosis not present

## 2015-10-20 LAB — POCT INR: INR: 1.8

## 2015-10-20 NOTE — Patient Instructions (Signed)
Anticoagulation Dose Instructions as of 10/20/2015      Dorene Grebe Tue Wed Thu Fri Sat   New Dose 2.5 mg 2.5 mg 2.5 mg 5 mg 2.5 mg 2.5 mg 2.5 mg    Description        Take warfarin 5mg  - 1 tablet on Wednesdays and 1/2 tablet all other days.      INR was 1.8 today.  Icy Hot patches or Salonpas patches

## 2015-10-25 DIAGNOSIS — R748 Abnormal levels of other serum enzymes: Secondary | ICD-10-CM | POA: Diagnosis not present

## 2015-10-28 ENCOUNTER — Ambulatory Visit (INDEPENDENT_AMBULATORY_CARE_PROVIDER_SITE_OTHER): Payer: Commercial Managed Care - HMO | Admitting: Family

## 2015-10-28 ENCOUNTER — Encounter: Payer: Self-pay | Admitting: Family

## 2015-10-28 VITALS — BP 126/75 | HR 88 | Temp 98.0°F | Ht 66.0 in | Wt 219.0 lb

## 2015-10-28 DIAGNOSIS — J0100 Acute maxillary sinusitis, unspecified: Secondary | ICD-10-CM

## 2015-10-28 MED ORDER — AMOXICILLIN-POT CLAVULANATE 875-125 MG PO TABS
1.0000 | ORAL_TABLET | Freq: Two times a day (BID) | ORAL | Status: DC
Start: 2015-10-28 — End: 2015-11-18

## 2015-10-28 NOTE — Progress Notes (Signed)
   Subjective:    Patient ID: Kimberly Obrien, female    DOB: 1960-11-28, 55 y.o.   MRN: [id number]  Sinus Problem This is a new problem. The current episode started in the past 7 days. The problem has been gradually worsening since onset. There has been no fever. The pain is mild. Associated symptoms include congestion, coughing, ear pain, headaches, sinus pressure and sneezing. Pertinent negatives include no chills, hoarse voice or shortness of breath. Past treatments include nothing. The treatment provided no relief.      Review of Systems  Constitutional: Negative.  Negative for chills.  HENT: Positive for congestion, ear pain, sinus pressure and sneezing. Negative for hoarse voice.   Eyes: Negative.   Respiratory: Positive for cough. Negative for shortness of breath.   Cardiovascular: Negative.  Negative for palpitations.  Gastrointestinal: Negative.   Endocrine: Negative.   Genitourinary: Negative.   Musculoskeletal: Negative.   Neurological: Positive for headaches.  Hematological: Negative.   Psychiatric/Behavioral: Negative.   All other systems reviewed and are negative.      Objective:   Physical Exam  Constitutional: She is oriented to person, place, and time. She appears well-developed and well-nourished. No distress.  HENT:  Head: Normocephalic and atraumatic.  Right Ear: External ear normal.  Left Ear: External ear normal.  Nose: Right sinus exhibits maxillary sinus tenderness. Right sinus exhibits no frontal sinus tenderness. Left sinus exhibits maxillary sinus tenderness and frontal sinus tenderness.  Nasal passage erythemas with mild swelling  Oropharynx erythemas  Eyes: Pupils are equal, round, and reactive to light.  Neck: Normal range of motion. Neck supple. No thyromegaly present.  Cardiovascular: Normal rate, regular rhythm, normal heart sounds and intact distal pulses.   No murmur heard. Pulmonary/Chest: Effort normal and breath sounds normal. No  respiratory distress. She has no wheezes.  Abdominal: Soft. Bowel sounds are normal. She exhibits no distension. There is no tenderness.  Musculoskeletal: Normal range of motion. She exhibits no edema or tenderness.  Neurological: She is alert and oriented to person, place, and time. She has normal reflexes. No cranial nerve deficit.  Skin: Skin is warm and dry.  Psychiatric: She has a normal mood and affect. Her behavior is normal. Judgment and thought content normal.  Vitals reviewed.     BP 126/75 mmHg  Pulse 88  Temp(Src) 98 F (36.7 C) (Oral)  Ht 5\' 6"  (1.676 m)  Wt 219 lb (99.338 kg)  BMI 35.36 kg/m2     Assessment & Plan:  1. Acute maxillary sinusitis, recurrence not specified -- Take meds as prescribed - Use a cool mist humidifier  -Use saline nose sprays frequently -Saline irrigations of the nose can be very helpful if done frequently.  * 4X daily for 1 week*  * Use of a nettie pot can be helpful with this. Follow directions with this* -Force fluids -For any cough or congestion  Use plain Mucinex- regular strength or max strength is fine   * Children- consult with Pharmacist for dosing -For fever or aces or pains- take tylenol or ibuprofen appropriate for age and weight.  * for fevers greater than 101 orally you may alternate ibuprofen and tylenol every  3 hours. -Throat lozenges if help - amoxicillin-clavulanate (AUGMENTIN) 875-125 MG tablet; Take 1 tablet by mouth 2 (two) times daily.  Dispense: 14 tablet; Refill: 0  Evelina Dun, FNP

## 2015-10-28 NOTE — Patient Instructions (Signed)

## 2015-11-02 DIAGNOSIS — J45909 Unspecified asthma, uncomplicated: Secondary | ICD-10-CM | POA: Diagnosis not present

## 2015-11-02 DIAGNOSIS — J811 Chronic pulmonary edema: Secondary | ICD-10-CM | POA: Diagnosis not present

## 2015-11-09 DIAGNOSIS — R748 Abnormal levels of other serum enzymes: Secondary | ICD-10-CM | POA: Diagnosis not present

## 2015-11-12 ENCOUNTER — Ambulatory Visit: Payer: Commercial Managed Care - HMO | Admitting: Cardiology

## 2015-11-18 ENCOUNTER — Telehealth: Payer: Self-pay | Admitting: Cardiovascular Disease

## 2015-11-18 ENCOUNTER — Ambulatory Visit (INDEPENDENT_AMBULATORY_CARE_PROVIDER_SITE_OTHER): Payer: Commercial Managed Care - HMO | Admitting: Pharmacist

## 2015-11-18 DIAGNOSIS — J811 Chronic pulmonary edema: Secondary | ICD-10-CM

## 2015-11-18 LAB — COAGUCHEK XS/INR WAIVED
INR: 1.4 — ABNORMAL HIGH (ref 0.9–1.1)
Prothrombin Time: 17.4 s

## 2015-11-18 NOTE — Telephone Encounter (Signed)
New message   Pt is calling for an order for ECHO

## 2015-11-18 NOTE — Patient Instructions (Signed)
Anticoagulation Dose Instructions as of 11/18/2015      Kimberly Obrien Tue Wed Thu Fri Sat   New Dose 2.5 mg 5 mg 2.5 mg 2.5 mg 2.5 mg 5 mg 2.5 mg    Description        Take 1 tablet  - Thursday, March 30th  Then change dose to 1 tablet on mondays and fridays and 1/2 tablet all other days.     INR was 1.7 today

## 2015-11-18 NOTE — Telephone Encounter (Signed)
Spoke with pt. She reports shortness of breath with any exertion. Happens with walking, cleaning, folding clothes. Also has pain which begins on right lower side and goes up to under right breast. Describes as cramping feeling and then feels sore afterwards.   Feels heart pounding with any activity. Also feels like heart skips at times.   Symptoms go away with rest.  She reports she was told right side of heart not seen well on last echo and she feels like she needs repeat echo.  Has had cath since echo done.  Last office visit notes heart pounding and shortness of breath.  Pt does not feel these symptoms have worsened since last office visit but she is concerned because they are continuing.  Will forward to Dr. Angelena Form for recommendations.

## 2015-11-19 NOTE — Telephone Encounter (Signed)
Called pt per Dr. Camillia Herter note to have her see an app this week.  Dr. Julianne Handler is in office Wed 4/5.  Made her an appointment to see Sandria Senter on Wed 4/5 while he is in the office for SOB w/exertion and palpitations.

## 2015-11-19 NOTE — Telephone Encounter (Signed)
Can we get her in to see an office APP next week? chris

## 2015-11-23 NOTE — Progress Notes (Signed)
Cardiology Office Note   Date:  11/24/2015   ID:  Kimberly Obrien, DOB 1961/07/01, MRN [id number]  PCP:  Claretta Fraise, MD  Cardiologist:  Dr. Angelena Form    Chief Complaint  Patient presents with  . Shortness of Breath    with exertion      History of Present Illness: Kimberly Obrien is a 55 y.o. female who presents for DOE and palpitations.   She history of COPD/asthma, arthritis and pulmonary embolism who is here today to establish in our [place] office. She has been followed by Dr. Jacinta Shoe in the [facility] Cardiology office in [facility]. She has been evaluated over the years for tachycardia/palpitations/fatigue/dyspnea. Echo showed normal LV systolic and diastolic function with no valve issues. Right and left heart cath 2015 with no evidence of CAD and normal PA pressures. Cardiac monitor 2016 with rare PVCS, PACs. CTA chest 2015 with no PE. Her first large pulmonary embolism was reportedly in December 1999 with 3 small pulmonary emboli in 2003. She is on coumadin. She completed 24 weeks of cardiac rehab this year.    With cardiopulmonary test read by Dr. Chase Caller with desat during exercise and SOB EKG remained stable.  Dr. Chase Caller noted "It fits in with ILD - the reading says obstruction on what the tech did but with desats, outside PFT showing restriction this is more likely ILD. In face 2015 CT chest shows some ILD. At at age 23 concern is autoimmune process"  Here in the office her sp02 dropped to 87% and HR to 132.  All previous monitoring noted ST.  Pt does have home oxygen but no small tanks to carry.   Past Medical History  Diagnosis Date  . Anticoagulation monitoring by pharmacist   . PE (pulmonary embolism)   . Arthritis   . COPD (chronic obstructive pulmonary disease) ([place])   . Asthma   . Autoimmune hepatitis [facility])     Past Surgical History  Procedure Laterality Date  . Tubal ligation    . Wrist surgery Right     Had surgery twice to shave bone  for blood circulation improvement.  . Cholecystectomy    . Spine surgery      L5 S1 fusion  . Abdominal hysterectomy    . Eye surgery      Cataracts  . Left and right heart catheterization with coronary angiogram N/A 05/13/2014    Procedure: LEFT AND RIGHT HEART CATHETERIZATION WITH CORONARY ANGIOGRAM;  Surgeon: [name], MD;  Location: [facility] CATH LAB;  Service: Cardiovascular;  Laterality: N/A;  . Incontinence surgery  2009     Current Outpatient Prescriptions  Medication Sig Dispense Refill  . acetaminophen (TYLENOL) 325 MG tablet Take 650 mg by mouth every 6 (six) hours as needed for mild pain.    . Aclidinium Bromide (TUDORZA PRESSAIR) 400 MCG/ACT AEPB Inhale 1 puff into the lungs 2 (two) times daily.    Marland Kitchen albuterol (PROVENTIL HFA;VENTOLIN HFA) 108 (90 BASE) MCG/ACT inhaler Inhale into the lungs every 6 (six) hours as needed for wheezing or shortness of breath.    . budesonide-formoterol (SYMBICORT) 80-4.5 MCG/ACT inhaler Inhale 2 puffs into the lungs daily. 1 Inhaler 0  . fluticasone (FLONASE) 50 MCG/ACT nasal spray Place 2 sprays into both nostrils daily. 16 g 6  . predniSONE (DELTASONE) 20 MG tablet Take 20 mg by mouth daily with breakfast.    . Vitamin D, Ergocalciferol, (DRISDOL) 50000 units CAPS capsule Take 1 capsule (50,000 Units total) by mouth every  7 (seven) days. 12 capsule 3  . warfarin (COUMADIN) 5 MG tablet TAKE ONE-HALF TO ONE TABLET BY MOUTH ONCE DAILY (Patient taking differently: TAKE ONE-HALF TO ONE TABLET BY MOUTH ONCE DAILY OR AS DIRECTED) 50 tablet 2   No current facility-administered medications for this visit.    Allergies:   Aciphex; Diflucan; Claritin; Lipitor; Macrobid; Metoprolol; Nsaids; Septra; Crestor; Doxycycline; and Ibuprofen    Social History:  The patient  reports that she quit smoking about 3 years ago. Her smoking use included Cigarettes. She started smoking about 38 years ago. She has a 68 pack-year smoking history. She has never used  smokeless tobacco. She reports that she does not drink alcohol or use illicit drugs.  FAmily History:  The patient's family history includes Cirrhosis in her mother; Heart failure in her father; Hyperlipidemia in her father; Hypertension in her brother; Obesity in her daughter. There is no history of Heart attack.    ROS:  General:no colds or fevers, no weight changes Skin:no rashes or ulcers HEENT:no blurred vision, no congestion CV:see HPI PUL:see HPI GI:no diarrhea constipation or melena, no indigestion GU:no hematuria, no dysuria MS:no joint pain, no claudication Neuro:no syncope, no lightheadedness Endo:no diabetes, no thyroid disease  Wt Readings from Last 3 Encounters:  11/24/15 219 lb 12.8 oz (99.701 kg)  10/28/15 219 lb (99.338 kg)  10/12/15 217 lb 12.8 oz (98.793 kg)     PHYSICAL EXAM: VS:  BP 118/70 mmHg  Pulse 67  Ht 5\' 6"  (1.676 m)  Wt 219 lb 12.8 oz (99.701 kg)  BMI 35.49 kg/m2 , BMI Body mass index is 35.49 kg/(m^2). General:Pleasant affect, NAD Skin:Warm and dry, brisk capillary refill HEENT:normocephalic, sclera clear, mucus membranes moist Neck:supple, no JVD, no bruits  Heart:S1S2 RRR without murmur, gallup, rub or click Lungs:clear without rales, rhonchi, or wheezes [id number], non tender, + BS, do not palpate liver spleen or masses Ext:no lower ext edema, 2+ pedal pulses, 2+ radial pulses Neuro:alert and oriented, MAE, follows commands, + facial symmetry    EKG:  EKG is ordered today. The ekg ordered today demonstrates normal SR   Recent Labs: 12/21/2014: Hemoglobin 15.1 09/23/2015: ALT 119*; BUN 10; Creatinine, Ser 0.79; Platelets 177; Potassium 4.2; Sodium 143; TSH 0.441*    Lipid Panel    Component Value Date/Time   CHOL 192 09/23/2015 1615   CHOL 206* 12/21/2014 0000   CHOL 208* 02/04/2013 1317   TRIG 165* 09/23/2015 1615   TRIG 110 12/21/2014 0000   TRIG 136 02/04/2013 1317   HDL 51 09/23/2015 1615   HDL 60 12/21/2014 0000   HDL 38  08/04/2013   HDL 40 02/04/2013 1317   CHOLHDL 3.8 09/23/2015 1615   LDLCALC 108* 09/23/2015 1615   LDLCALC 65 08/04/2013 1332   LDLCALC 65 08/04/2013   LDLCALC 141* 02/04/2013 1317       Other studies Reviewed: Additional studies/ records that were reviewed today include: previous notes, heart cath and cardiopulmonary test. .   ASSESSMENT AND PLAN:  1.  Dyspnea with oxygen desaturation with ambulation and tachycardia to 132 today.  She will discuss with Dr. Luan Pulling and I will send note to him as well.  But she will need to wear her oxygen with exertion, I believe her increase in HR is due to pulmonary issue.  I discussed with Dr. Angelena Form and he believes the same. + COPD and stopped smoking in 2013.   2. Cardiac cath with normal coronary arteries and normal EF    She  will follow up with Dr. Angelena Form in 1 year unless further problems.  All questions answered.    Current medicines are reviewed with the patient today.  The patient Has no concerns regarding medicines.  The following changes have been made:  See above Labs/ tests ordered today include:see above  Disposition:   FU:  see above  Signed, Isaiah Serge, NP  11/24/2015 1:01 PM    [facility] [place], [place], [place], [place] Phone: [phone]; Fax: [phone]

## 2015-11-24 ENCOUNTER — Ambulatory Visit (INDEPENDENT_AMBULATORY_CARE_PROVIDER_SITE_OTHER): Payer: Commercial Managed Care - HMO | Admitting: Cardiology

## 2015-11-24 ENCOUNTER — Encounter: Payer: Self-pay | Admitting: Cardiology

## 2015-11-24 VITALS — BP 118/70 | HR 67 | Ht 66.0 in | Wt 219.8 lb

## 2015-11-24 DIAGNOSIS — E785 Hyperlipidemia, unspecified: Secondary | ICD-10-CM | POA: Diagnosis not present

## 2015-11-24 DIAGNOSIS — I480 Paroxysmal atrial fibrillation: Secondary | ICD-10-CM

## 2015-11-24 DIAGNOSIS — I482 Chronic atrial fibrillation, unspecified: Secondary | ICD-10-CM

## 2015-11-24 DIAGNOSIS — R000 Tachycardia, unspecified: Secondary | ICD-10-CM | POA: Diagnosis not present

## 2015-11-24 DIAGNOSIS — R0902 Hypoxemia: Secondary | ICD-10-CM

## 2015-11-24 NOTE — Patient Instructions (Signed)
Medication Instructions:  Your physician recommends that you continue on your current medications as directed. Please refer to the Current Medication list given to you today.   Labwork: None orderred  Testing/Procedures: None ordered  Follow-Up: Your physician wants you to follow-up in: [place] will receive a reminder letter in the mail two months in advance. If you don't receive a letter, please call our office to schedule the follow-up appointment.  YOU WILL NEED TO MAKE AN APPOINTMENT WITH DR. Luan Pulling   Any Other Special Instructions Will Be Listed Below (If Applicable).   MAKE SURE YOU WEAR YOUR OXYGEN WITH ANY TYPE OF EXERCISE  If you need a refill on your cardiac medications before your next appointment, please call your pharmacy.

## 2015-11-29 ENCOUNTER — Other Ambulatory Visit: Payer: Commercial Managed Care - HMO

## 2015-11-29 DIAGNOSIS — Z139 Encounter for screening, unspecified: Secondary | ICD-10-CM

## 2015-11-29 DIAGNOSIS — R748 Abnormal levels of other serum enzymes: Secondary | ICD-10-CM | POA: Diagnosis not present

## 2015-12-01 ENCOUNTER — Ambulatory Visit (INDEPENDENT_AMBULATORY_CARE_PROVIDER_SITE_OTHER): Payer: Commercial Managed Care - HMO | Admitting: Nurse Practitioner

## 2015-12-01 ENCOUNTER — Encounter: Payer: Self-pay | Admitting: Nurse Practitioner

## 2015-12-01 VITALS — BP 117/66 | HR 75 | Temp 97.4°F | Ht 66.0 in | Wt 221.0 lb

## 2015-12-01 DIAGNOSIS — J0101 Acute recurrent maxillary sinusitis: Secondary | ICD-10-CM

## 2015-12-01 MED ORDER — AZITHROMYCIN 250 MG PO TABS: ORAL_TABLET | ORAL | Status: DC

## 2015-12-01 NOTE — Patient Instructions (Signed)

## 2015-12-01 NOTE — Progress Notes (Signed)
  Subjective:     Kimberly Obrien is a 55 y.o. female who presents for evaluation of sinus pain. Symptoms include: congestion, facial pain and left ear pain and noise. Onset of symptoms was 1 week ago. Symptoms have been unchanged since that time. Past history is significant for no history of pneumonia or bronchitis. Patient is a former smoker, quit 4 years ago.  The following portions of the patient's history were reviewed and updated as appropriate: allergies, current medications, past family history, past medical history, past social history, past surgical history and problem list.  Review of Systems Pertinent items are noted in HPI.   Objective:    BP 117/66 mmHg  Pulse 75  Temp(Src) 97.4 F (36.3 C) (Oral)  Ht 5\' 6"  (1.676 m)  Wt 221 lb (100.245 kg)  BMI 35.69 kg/m2 General appearance: alert, cooperative and no distress Eyes: conjunctivae/corneas clear. PERRL, EOM's intact. Fundi benign. Ears: normal TM's and external ear canals both ears Nose: purulent discharge, moderate congestion, turbinates normal, moderate maxillary sinus tenderness left, moderate frontal sinus tenderness left Throat: lips, mucosa, and tongue normal; teeth and gums normal Neck: no adenopathy, no carotid bruit, no JVD, supple, symmetrical, trachea midline and thyroid not enlarged, symmetric, no tenderness/mass/nodules Lungs: clear to auscultation bilaterally Heart: regular rate and rhythm, S1, S2 normal, no murmur, click, rub or gallop    Assessment:    Acute bacterial sinusitis.    Plan:  1. Take meds as prescribed 2. Use a cool mist humidifier especially during the winter months and when heat has been humid. 3. Use saline nose sprays frequently 4. Saline irrigations of the nose can be very helpful if done frequently.  * 4X daily for 1 week*  * Use of a nettie pot can be helpful with this. Follow directions with this* 5. Drink plenty of fluids 6. Keep thermostat turn down low 7.For any cough or  congestion  Use plain Mucinex- regular strength or max strength is fine   * Children- consult with Pharmacist for dosing 8. For fever or aces or pains- take tylenol or ibuprofen appropriate for age and weight.  * for fevers greater than 101 orally you may alternate ibuprofen and tylenol every  3 hours.   Meds ordered this encounter  Medications  . azithromycin (ZITHROMAX) 250 MG tablet    Sig: Two tablets day one, then one tablet daily next 4 days.    Dispense:  6 tablet    Refill:  0    Order Specific Question:  Supervising Provider    Answer:  Chipper Herb [id number]   Mary-Margaret Hassell Done, FNP

## 2015-12-03 DIAGNOSIS — J811 Chronic pulmonary edema: Secondary | ICD-10-CM | POA: Diagnosis not present

## 2015-12-03 DIAGNOSIS — J45909 Unspecified asthma, uncomplicated: Secondary | ICD-10-CM | POA: Diagnosis not present

## 2015-12-04 LAB — FECAL OCCULT BLOOD, IMMUNOCHEMICAL: Fecal Occult Bld: POSITIVE — AB

## 2015-12-05 ENCOUNTER — Emergency Department (HOSPITAL_COMMUNITY)
Admission: EM | Admit: 2015-12-05 | Discharge: 2015-12-06 | Disposition: A | Discharge status: Home / Self Care | Payer: Commercial Managed Care - HMO | Attending: Emergency Medicine | Admitting: Emergency Medicine

## 2015-12-05 ENCOUNTER — Encounter (HOSPITAL_COMMUNITY): Payer: Self-pay | Admitting: Family Medicine

## 2015-12-05 DIAGNOSIS — Z90710 Acquired absence of both cervix and uterus: Secondary | ICD-10-CM | POA: Insufficient documentation

## 2015-12-05 DIAGNOSIS — Z7951 Long term (current) use of inhaled steroids: Secondary | ICD-10-CM | POA: Insufficient documentation

## 2015-12-05 DIAGNOSIS — X58XXXA Exposure to other specified factors, initial encounter: Secondary | ICD-10-CM | POA: Insufficient documentation

## 2015-12-05 DIAGNOSIS — Y9389 Activity, other specified: Secondary | ICD-10-CM | POA: Insufficient documentation

## 2015-12-05 DIAGNOSIS — Z86711 Personal history of pulmonary embolism: Secondary | ICD-10-CM | POA: Diagnosis not present

## 2015-12-05 DIAGNOSIS — Z9049 Acquired absence of other specified parts of digestive tract: Secondary | ICD-10-CM | POA: Insufficient documentation

## 2015-12-05 DIAGNOSIS — Z7901 Long term (current) use of anticoagulants: Secondary | ICD-10-CM | POA: Insufficient documentation

## 2015-12-05 DIAGNOSIS — Z87891 Personal history of nicotine dependence: Secondary | ICD-10-CM | POA: Insufficient documentation

## 2015-12-05 DIAGNOSIS — J449 Chronic obstructive pulmonary disease, unspecified: Secondary | ICD-10-CM | POA: Diagnosis not present

## 2015-12-05 DIAGNOSIS — Y998 Other external cause status: Secondary | ICD-10-CM | POA: Diagnosis not present

## 2015-12-05 DIAGNOSIS — R109 Unspecified abdominal pain: Secondary | ICD-10-CM | POA: Diagnosis not present

## 2015-12-05 DIAGNOSIS — Z9889 Other specified postprocedural states: Secondary | ICD-10-CM | POA: Diagnosis not present

## 2015-12-05 DIAGNOSIS — Z79899 Other long term (current) drug therapy: Secondary | ICD-10-CM | POA: Insufficient documentation

## 2015-12-05 DIAGNOSIS — Y9289 Other specified places as the place of occurrence of the external cause: Secondary | ICD-10-CM | POA: Insufficient documentation

## 2015-12-05 DIAGNOSIS — M1990 Unspecified osteoarthritis, unspecified site: Secondary | ICD-10-CM | POA: Diagnosis not present

## 2015-12-05 DIAGNOSIS — Z7952 Long term (current) use of systemic steroids: Secondary | ICD-10-CM | POA: Insufficient documentation

## 2015-12-05 DIAGNOSIS — K029 Dental caries, unspecified: Secondary | ICD-10-CM | POA: Diagnosis not present

## 2015-12-05 DIAGNOSIS — N200 Calculus of kidney: Secondary | ICD-10-CM | POA: Diagnosis not present

## 2015-12-05 DIAGNOSIS — S39011A Strain of muscle, fascia and tendon of abdomen, initial encounter: Secondary | ICD-10-CM | POA: Insufficient documentation

## 2015-12-05 DIAGNOSIS — R52 Pain, unspecified: Secondary | ICD-10-CM

## 2015-12-05 DIAGNOSIS — T148XXA Other injury of unspecified body region, initial encounter: Secondary | ICD-10-CM

## 2015-12-05 NOTE — ED Notes (Signed)
Patient reports she is experiencing mid back pain that radiates around to abd and lower back pain. Symptoms started intermittently last week but has got worse. Nausea with no vomiting. Denies fever or urinary systems. Pt reports she has diarrhea but this occurs intermittently.

## 2015-12-06 ENCOUNTER — Emergency Department (HOSPITAL_COMMUNITY): Payer: Commercial Managed Care - HMO

## 2015-12-06 ENCOUNTER — Encounter (HOSPITAL_COMMUNITY): Payer: Self-pay | Admitting: Emergency Medicine

## 2015-12-06 DIAGNOSIS — R109 Unspecified abdominal pain: Secondary | ICD-10-CM | POA: Diagnosis not present

## 2015-12-06 DIAGNOSIS — N200 Calculus of kidney: Secondary | ICD-10-CM | POA: Diagnosis not present

## 2015-12-06 LAB — CBC WITH DIFFERENTIAL/PLATELET
Basophils Absolute: 0 10*3/uL (ref 0.0–0.1)
Basophils Relative: 0 %
Eosinophils Absolute: 0.1 10*3/uL (ref 0.0–0.7)
Eosinophils Relative: 0 %
HCT: 41.3 % (ref 36.0–46.0)
Hemoglobin: 14.4 g/dL (ref 12.0–15.0)
Lymphocytes Relative: 20 %
Lymphs Abs: 2.9 10*3/uL (ref 0.7–4.0)
MCH: 29.8 pg (ref 26.0–34.0)
MCHC: 34.9 g/dL (ref 30.0–36.0)
MCV: 85.3 fL (ref 78.0–100.0)
Monocytes Absolute: 0.7 10*3/uL (ref 0.1–1.0)
Monocytes Relative: 5 %
Neutro Abs: 10.9 10*3/uL — ABNORMAL HIGH (ref 1.7–7.7)
Neutrophils Relative %: 75 %
Platelets: 143 10*3/uL — ABNORMAL LOW (ref 150–400)
RBC: 4.84 MIL/uL (ref 3.87–5.11)
RDW: 14.2 % (ref 11.5–15.5)
WBC: 14.5 10*3/uL — ABNORMAL HIGH (ref 4.0–10.5)

## 2015-12-06 LAB — URINE MICROSCOPIC-ADD ON: RBC / HPF: NONE SEEN RBC/hpf (ref 0–5)

## 2015-12-06 LAB — I-STAT CHEM 8, ED
BUN: 11 mg/dL (ref 6–20)
Calcium, Ion: 1.17 mmol/L (ref 1.12–1.23)
Chloride: 103 mmol/L (ref 101–111)
Creatinine, Ser: 0.6 mg/dL (ref 0.44–1.00)
Glucose, Bld: 101 mg/dL — ABNORMAL HIGH (ref 65–99)
HCT: 44 % (ref 36.0–46.0)
Hemoglobin: 15 g/dL (ref 12.0–15.0)
Potassium: 3.6 mmol/L (ref 3.5–5.1)
Sodium: 142 mmol/L (ref 135–145)
TCO2: 27 mmol/L (ref 0–100)

## 2015-12-06 LAB — URINALYSIS, ROUTINE W REFLEX MICROSCOPIC
Bilirubin Urine: NEGATIVE
Glucose, UA: NEGATIVE mg/dL
Hgb urine dipstick: NEGATIVE
Ketones, ur: NEGATIVE mg/dL
Nitrite: NEGATIVE
Protein, ur: NEGATIVE mg/dL
Specific Gravity, Urine: 1.016 (ref 1.005–1.030)
pH: 6 (ref 5.0–8.0)

## 2015-12-06 LAB — PROTIME-INR
INR: 1.45 (ref 0.00–1.49)
INR: 1.46 (ref 0.00–1.49)
Prothrombin Time: 17.7 seconds — ABNORMAL HIGH (ref 11.6–15.2)
Prothrombin Time: 17.8 seconds — ABNORMAL HIGH (ref 11.6–15.2)

## 2015-12-06 MED ORDER — KETOROLAC TROMETHAMINE 30 MG/ML IJ SOLN
30.0000 mg | Freq: Once | INTRAMUSCULAR | Status: AC
  Administered 2015-12-06: 30 mg via INTRAVENOUS
  Filled 2015-12-06: qty 1

## 2015-12-06 MED ORDER — METHOCARBAMOL 500 MG PO TABS: 500.0000 mg | ORAL_TABLET | Freq: Two times a day (BID) | ORAL | Status: DC

## 2015-12-06 MED ORDER — WARFARIN SODIUM 3 MG PO TABS
3.0000 mg | ORAL_TABLET | ORAL | Status: AC
  Administered 2015-12-06: 3 mg via ORAL
  Filled 2015-12-06: qty 1

## 2015-12-06 MED ORDER — ENOXAPARIN SODIUM 100 MG/ML ~~LOC~~ SOLN
1.0000 mg/kg | Freq: Once | SUBCUTANEOUS | Status: AC
  Administered 2015-12-06: 100 mg via SUBCUTANEOUS
  Filled 2015-12-06: qty 1

## 2015-12-06 MED ORDER — WARFARIN - PHARMACIST DOSING INPATIENT: Freq: Every day | Status: DC

## 2015-12-06 MED ORDER — IOPAMIDOL (ISOVUE-370) INJECTION 76%
100.0000 mL | Freq: Once | INTRAVENOUS | Status: AC | PRN
  Administered 2015-12-06: 100 mL via INTRAVENOUS

## 2015-12-06 NOTE — Progress Notes (Signed)
ANTICOAGULATION CONSULT NOTE - Initial Consult  Pharmacy Consult for Warfarin Indication: atrial fibrillation  Allergies  Allergen Reactions  . Aciphex [Rabeprazole Sodium] Rash  . Diflucan [Fluconazole] Rash  . Claritin [Loratadine] Other (See Comments)    Tired   . Lipitor [Atorvastatin] Other (See Comments)    Myalgias, leg pain.  Problems with pancreas, sugars went up also.  Santiago Bur [Nitrofurantoin Macrocrystal] Nausea And Vomiting  . Metoprolol Nausea And Vomiting  . Nsaids Other (See Comments)    Upset stomach  . Septra [Sulfamethoxazole-Trimethoprim] Nausea And Vomiting  . Crestor [Rosuvastatin] Other (See Comments)    Elevated blood glucose and abdominal pain  . Doxycycline Rash  . Ibuprofen Other (See Comments)    Upset stomach    Patient Measurements: Height: 5\' 6"  (167.6 cm) Weight: 220 lb (99.791 kg) IBW/kg (Calculated) : 59.3 Heparin Dosing Weight:   Vital Signs: Temp: 97.9 F (36.6 C) (04/16 2255) Temp Source: Oral (04/16 2255) BP: 114/75 mmHg (04/17 0131) Pulse Rate: 65 (04/17 0131)  Labs:  Recent Labs  12/06/15 0050 12/06/15 0057  HGB 14.4 15.0  HCT 41.3 44.0  PLT 143*  --   LABPROT 17.7*  --   INR 1.45  --   CREATININE  --  0.60    Estimated Creatinine Clearance: 94.7 mL/min (by C-G formula based on Cr of 0.6).   Medical History: Past Medical History  Diagnosis Date  . Anticoagulation monitoring by pharmacist   . PE (pulmonary embolism)   . Arthritis   . COPD (chronic obstructive pulmonary disease) ([place])   . Asthma   . Autoimmune hepatitis ([place])     Medications:   (Not in a hospital admission) Scheduled:  . warfarin  3 mg Oral NOW  . Warfarin - Pharmacist Dosing Inpatient   Does not apply q1800    Assessment: Patient with chronic warfarin for afib.  INR < 2 on admit.  Last dose noted to be 4/16.  Will give extra dose now, due to low INR  Goal of Therapy:  INR 2-3    Plan:  Warfarin 3mg  po x1 now Daily  INR  Nani Skillern Crowford 12/06/2015,2:29 AM

## 2015-12-06 NOTE — ED Provider Notes (Signed)
CSN: [id number]     Arrival date & time 12/05/15  2238 History   By signing my name below, I, Kimberly Obrien, attest that this documentation has been prepared under the direction and in the presence of Shamar Engelmann, MD.  Electronically Signed: Forrestine Obrien, ED Scribe. 12/06/2015. 2:02 AM.   Chief Complaint  Patient presents with  . Flank Pain   Patient is a 55 y.o. female presenting with flank pain. The history is provided by the patient. No language interpreter was used.  Flank Pain This is a new problem. The current episode started more than 2 days ago. The problem occurs constantly. The problem has not changed since onset.Associated symptoms include abdominal pain. Pertinent negatives include no chest pain, no headaches and no shortness of breath. Nothing aggravates the symptoms. Nothing relieves the symptoms.    HPI Comments: Kimberly Obrien is a 55 y.o. female with a PMHx of COPD and autoimmune hepatitis who presents to the Emergency Department complaining of intermittent, ongoing L flank pain that radiates into the LLQ x 1 week; worsened in last 3 days. No aggravating or alleviating factors. She also reports foul smelling urine and intermittent diarrhea. However, diarrhea is not new for her. OTC Ibuprofen attempted at home without any improvement. No recent fever, chills, nausea, vomiting, shortness of breath or chest pain. She is currently on Coumadin with last PT-INR 1.5. She is due to have her Coumadin checked in the coming weeks.  PCP: Claretta Fraise, MD    Past Medical History  Diagnosis Date  . Anticoagulation monitoring by pharmacist   . PE (pulmonary embolism)   . Arthritis   . COPD (chronic obstructive pulmonary disease) ([place])   . Asthma   . Autoimmune hepatitis [facility])    Past Surgical History  Procedure Laterality Date  . Tubal ligation    . Wrist surgery Right     Had surgery twice to shave bone for blood circulation improvement.  . Cholecystectomy    . Spine  surgery      L5 S1 fusion  . Abdominal hysterectomy    . Eye surgery      Cataracts  . Left and right heart catheterization with coronary angiogram N/A 05/13/2014    Procedure: LEFT AND RIGHT HEART CATHETERIZATION WITH CORONARY ANGIOGRAM;  Surgeon: [name], MD;  Location: [facility] CATH LAB;  Service: Cardiovascular;  Laterality: N/A;  . Incontinence surgery  2009   Family History  Problem Relation Age of Onset  . Cirrhosis Mother   . Hyperlipidemia Father   . Obesity Daughter   . Hypertension Brother   . Heart failure Father   . Heart attack Neg Hx    Social History  Substance Use Topics  . Smoking status: Former Smoker -- 2.00 packs/day for 34 years    Types: Cigarettes    Start date: 08/22/1977    Quit date: 04/21/2012  . Smokeless tobacco: Never Used  . Alcohol Use: No   OB History    No data available     Review of Systems  Constitutional: Negative for fever and chills.  Respiratory: Negative for cough and shortness of breath.   Cardiovascular: Negative for chest pain.  Gastrointestinal: Positive for abdominal pain.  Genitourinary: Positive for flank pain.  Skin: Negative for rash.  Neurological: Negative for headaches.  Psychiatric/Behavioral: Negative for confusion.  All other systems reviewed and are negative.     Allergies  Aciphex; Diflucan; Claritin; Lipitor; Macrobid; Metoprolol; Nsaids; Septra; Crestor; Doxycycline; and Ibuprofen  Home  Medications   Prior to Admission medications   Medication Sig Start Date End Date Taking? Authorizing Provider  albuterol (PROVENTIL HFA;VENTOLIN HFA) 108 (90 BASE) MCG/ACT inhaler Inhale into the lungs every 6 (six) hours as needed for wheezing or shortness of breath.   Yes Historical Provider, MD  azithromycin (ZITHROMAX) 250 MG tablet Two tablets day one, then one tablet daily next 4 days. 12/01/15  Yes Mary-Margaret Hassell Done, FNP  budesonide-formoterol (SYMBICORT) 80-4.5 MCG/ACT inhaler Inhale 2 puffs into the  lungs daily. 04/05/15  Yes Claretta Fraise, MD  fluticasone (FLONASE) 50 MCG/ACT nasal spray Place 2 sprays into both nostrils daily. 08/26/15  Yes Timmothy Euler, MD  ibuprofen (ADVIL,MOTRIN) 200 MG tablet Take 200 mg by mouth every 6 (six) hours as needed for moderate pain.   Yes Historical Provider, MD  Multiple Vitamin (MULTIVITAMIN WITH MINERALS) TABS tablet Take 1 tablet by mouth daily.   Yes Historical Provider, MD  predniSONE (DELTASONE) 20 MG tablet Take 20 mg by mouth daily with breakfast.   Yes Historical Provider, MD  Vitamin D, Ergocalciferol, (DRISDOL) 50000 units CAPS capsule Take 1 capsule (50,000 Units total) by mouth every 7 (seven) days. Patient taking differently: Take 50,000 Units by mouth every Wednesday.  09/27/15  Yes Sharion Balloon, FNP  warfarin (COUMADIN) 5 MG tablet TAKE ONE-HALF TO ONE TABLET BY MOUTH ONCE DAILY Patient taking differently: Take 5mg  Monday and Friday. Take 2.5mg  all other days of the week 07/23/15  Yes Claretta Fraise, MD   Triage Vitals: BP 114/75 mmHg  Pulse 65  Temp(Src) 97.9 F (36.6 C) (Oral)  Resp 19  Ht 5\' 6"  (1.676 m)  Wt 220 lb (99.791 kg)  BMI 35.53 kg/m2  SpO2 95%   Physical Exam  Constitutional: She is oriented to person, place, and time. She appears well-developed and well-nourished. No distress.  HENT:  Head: Normocephalic and atraumatic.  Mouth/Throat: Oropharynx is clear and moist. No oropharyngeal exudate.  Diffuse dental decay noted  Eyes: EOM are normal.  Neck: Normal range of motion.  Cardiovascular: Normal rate, regular rhythm and normal heart sounds.   Pulmonary/Chest: Effort normal and breath sounds normal. She has no wheezes. She has no rales.  Abdominal: Soft. She exhibits no distension and no mass. There is no tenderness. There is no rebound and no guarding.  Musculoskeletal: Normal range of motion.  No cords Negative Homans sign   Neurological: She is alert and oriented to person, place, and time.  Skin: Skin is warm  and dry.  Psychiatric: She has a normal mood and affect. Judgment normal.  Nursing note and vitals reviewed.   ED Course  Procedures (including critical care time)  DIAGNOSTIC STUDIES: Oxygen Saturation is 96% on RA, Normal by my interpretation.    COORDINATION OF CARE: 1:52 AM- Will order imaging, blood work, and PT-INR. Will give Toradol. Discussed treatment plan with pt at bedside and pt agreed to plan.     Labs Review Labs Reviewed  URINALYSIS, ROUTINE W REFLEX MICROSCOPIC (NOT AT [facility]) - Abnormal; Notable for the following:    APPearance CLOUDY (*)    Leukocytes, UA TRACE (*)    All other components within normal limits  URINE MICROSCOPIC-ADD ON - Abnormal; Notable for the following:    Squamous Epithelial / LPF 6-30 (*)    Bacteria, UA RARE (*)    All other components within normal limits  CBC WITH DIFFERENTIAL/PLATELET - Abnormal; Notable for the following:    WBC 14.5 (*)    Platelets  143 (*)    Neutro Abs 10.9 (*)    All other components within normal limits  PROTIME-INR - Abnormal; Notable for the following:    Prothrombin Time 17.7 (*)    All other components within normal limits  I-STAT CHEM 8, ED - Abnormal; Notable for the following:    Glucose, Bld 101 (*)    All other components within normal limits    Imaging Review No results found. I have personally reviewed and evaluated these images and lab results as part of my medical decision-making.   EKG Interpretation None      MDM   Final diagnoses:  None    Results for orders placed or performed during the hospital encounter of 12/05/15  Urinalysis, Routine w reflex microscopic (not at [facility])  Result Value Ref Range   Color, Urine YELLOW YELLOW   APPearance CLOUDY (A) CLEAR   Specific Gravity, Urine 1.016 1.005 - 1.030   pH 6.0 5.0 - 8.0   Glucose, UA NEGATIVE NEGATIVE mg/dL   Hgb urine dipstick NEGATIVE NEGATIVE   Bilirubin Urine NEGATIVE NEGATIVE   Ketones, ur NEGATIVE NEGATIVE mg/dL    Protein, ur NEGATIVE NEGATIVE mg/dL   Nitrite NEGATIVE NEGATIVE   Leukocytes, UA TRACE (A) NEGATIVE  Urine microscopic-add on  Result Value Ref Range   Squamous Epithelial / LPF 6-30 (A) NONE SEEN   WBC, UA 0-5 0 - 5 WBC/hpf   RBC / HPF NONE SEEN 0 - 5 RBC/hpf   Bacteria, UA RARE (A) NONE SEEN  CBC with Differential/Platelet  Result Value Ref Range   WBC 14.5 (H) 4.0 - 10.5 K/uL   RBC 4.84 3.87 - 5.11 MIL/uL   Hemoglobin 14.4 12.0 - 15.0 g/dL   HCT 41.3 36.0 - 46.0 %   MCV 85.3 78.0 - 100.0 fL   MCH 29.8 26.0 - 34.0 pg   MCHC 34.9 30.0 - 36.0 g/dL   RDW 14.2 11.5 - 15.5 %   Platelets 143 (L) 150 - 400 K/uL   Neutrophils Relative % 75 %   Neutro Abs 10.9 (H) 1.7 - 7.7 K/uL   Lymphocytes Relative 20 %   Lymphs Abs 2.9 0.7 - 4.0 K/uL   Monocytes Relative 5 %   Monocytes Absolute 0.7 0.1 - 1.0 K/uL   Eosinophils Relative 0 %   Eosinophils Absolute 0.1 0.0 - 0.7 K/uL   Basophils Relative 0 %   Basophils Absolute 0.0 0.0 - 0.1 K/uL  Protime-INR  Result Value Ref Range   Prothrombin Time 17.7 (H) 11.6 - 15.2 seconds   INR 1.45 0.00 - 1.49  Protime-INR  Result Value Ref Range   Prothrombin Time 17.8 (H) 11.6 - 15.2 seconds   INR 1.46 0.00 - 1.49  I-Stat Chem 8, ED  Result Value Ref Range   Sodium 142 135 - 145 mmol/L   Potassium 3.6 3.5 - 5.1 mmol/L   Chloride 103 101 - 111 mmol/L   BUN 11 6 - 20 mg/dL   Creatinine, Ser 0.60 0.44 - 1.00 mg/dL   Glucose, Bld 101 (H) 65 - 99 mg/dL   Calcium, Ion 1.17 1.12 - 1.23 mmol/L   TCO2 27 0 - 100 mmol/L   Hemoglobin 15.0 12.0 - 15.0 g/dL   HCT 44.0 36.0 - 46.0 %   Ct Renal Stone Study  12/06/2015  CLINICAL DATA:  Initial evaluation for acute intermittent left flank pain for 1 week, radiating to the left lower quadrant, worse in last 3 days. Foul-smelling urine.  Mild leukocytosis. EXAM: CT ABDOMEN AND PELVIS WITHOUT CONTRAST TECHNIQUE: Multidetector CT imaging of the abdomen and pelvis was performed following the standard protocol  without IV contrast. COMPARISON:  Prior CT from 05/25/2015. FINDINGS: Mild bibasilar atelectasis/scarring noted within the visualized lung bases. Visualized lungs are otherwise clear. Limited noncontrast evaluation of the liver is unremarkable. Gallbladder surgically absent. No biliary dilatation. Spleen, adrenal glands, and pancreas demonstrate a normal unenhanced appearance. Right kidney unremarkable without evidence of nephrolithiasis or hydronephrosis. No radiopaque calculi seen along the course of the right renal collecting system. There is no right-sided hydroureter. 2 mm nonobstructive left renal calculus within the interpolar left kidney. No left-sided hydronephrosis. No radiopaque calculi seen along the course of the left renal collecting system. No left-sided hydroureter. Stomach within normal limits. No evidence for bowel obstruction. No abnormal wall thickening or inflammatory fat stranding seen about the bowels. Appendix is normal. Bladder partially distended but otherwise unremarkable. Uterus is absent. Ovaries grossly unremarkable. No free air or fluid. No pathologically enlarged intra-abdominal or pelvic lymph nodes. Mild aorto bi-iliac atherosclerotic disease. No aneurysm. No acute osseus abnormality. Postsurgical changes noted within the lower lumbar spine. No worrisome lytic or blastic osseous lesions. IMPRESSION: 1. 2 mm nonobstructive left renal nephrolithiasis. No CT evidence for obstructive uropathy. No significant inflammatory changes about the left kidney on this noncontrast examination. 2. No other acute intra-abdominal or pelvic process. 3. Status post cholecystectomy. Electronically Signed   By: Jeannine Boga M.D.   On: 12/06/2015 03:19    Filed Vitals:   12/06/15 0436 12/06/15 0639  BP: 92/60 119/63  Pulse: 62 70  Temp: 97.6 F (36.4 C) 97.5 F (36.4 C)  Resp: 20 19   Medications  Warfarin - Pharmacist Dosing Inpatient (not administered)  ketorolac (TORADOL) 30 MG/ML  injection 30 mg (30 mg Intravenous Given 12/06/15 0216)  warfarin (COUMADIN) tablet 3 mg (3 mg Oral Given 12/06/15 0348)  iopamidol (ISOVUE-370) 76 % injection 100 mL (100 mLs Intravenous Contrast Given 12/06/15 0643)   I personally performed the services described in this documentation, which was scribed in my presence. The recorded information has been reviewed and is accurate.   Given an additional dose of coumadin in the ED.  Will give a dose of lovenox and increase patient's coumadin and have patient follow up for INR recheck in 48 hours with PMD.  No green leafy vegetables.    I personally performed the services described in this documentation, which was scribed in my presence. The recorded information has been reviewed and is accurate.     Veatrice Kells, MD 12/06/15 [phone]

## 2015-12-06 NOTE — Discharge Instructions (Signed)

## 2015-12-08 ENCOUNTER — Other Ambulatory Visit: Payer: Self-pay | Admitting: Family Medicine

## 2015-12-08 ENCOUNTER — Ambulatory Visit (INDEPENDENT_AMBULATORY_CARE_PROVIDER_SITE_OTHER): Payer: Commercial Managed Care - HMO | Admitting: Pharmacist

## 2015-12-08 ENCOUNTER — Ambulatory Visit: Payer: Commercial Managed Care - HMO | Admitting: Cardiology

## 2015-12-08 DIAGNOSIS — J811 Chronic pulmonary edema: Secondary | ICD-10-CM | POA: Diagnosis not present

## 2015-12-08 DIAGNOSIS — M5442 Lumbago with sciatica, left side: Secondary | ICD-10-CM | POA: Diagnosis not present

## 2015-12-08 DIAGNOSIS — M779 Enthesopathy, unspecified: Secondary | ICD-10-CM | POA: Diagnosis not present

## 2015-12-08 DIAGNOSIS — K921 Melena: Secondary | ICD-10-CM

## 2015-12-08 DIAGNOSIS — M542 Cervicalgia: Secondary | ICD-10-CM | POA: Diagnosis not present

## 2015-12-08 DIAGNOSIS — M5030 Other cervical disc degeneration, unspecified cervical region: Secondary | ICD-10-CM | POA: Diagnosis not present

## 2015-12-08 LAB — COAGUCHEK XS/INR WAIVED
INR: 1.7 — ABNORMAL HIGH (ref 0.9–1.1)
Prothrombin Time: 20.6 s

## 2015-12-08 NOTE — Patient Instructions (Signed)
Anticoagulation Dose Instructions as of 12/08/2015      Kimberly Obrien Tue Wed Thu Fri Sat   New Dose 2.5 mg 5 mg 2.5 mg 5 mg 2.5 mg 5 mg 2.5 mg    Description        Increase dose 1 tablet on mondays, wednesdays and fridays and 1/2 tablet all other days - start today - Wednesday, April 19th.      INR was 1.7 today

## 2015-12-09 ENCOUNTER — Encounter (INDEPENDENT_AMBULATORY_CARE_PROVIDER_SITE_OTHER): Payer: Self-pay | Admitting: *Deleted

## 2015-12-22 DIAGNOSIS — R748 Abnormal levels of other serum enzymes: Secondary | ICD-10-CM | POA: Diagnosis not present

## 2015-12-24 ENCOUNTER — Encounter: Payer: Self-pay | Admitting: Family Medicine

## 2015-12-24 ENCOUNTER — Ambulatory Visit (INDEPENDENT_AMBULATORY_CARE_PROVIDER_SITE_OTHER): Payer: Commercial Managed Care - HMO | Admitting: Family Medicine

## 2015-12-24 DIAGNOSIS — J811 Chronic pulmonary edema: Secondary | ICD-10-CM

## 2015-12-24 LAB — COAGUCHEK XS/INR WAIVED
INR: 2.6 — ABNORMAL HIGH (ref 0.9–1.1)
Prothrombin Time: 31.4 s

## 2015-12-24 MED ORDER — AMOXICILLIN-POT CLAVULANATE 875-125 MG PO TABS: 1.0000 | ORAL_TABLET | Freq: Two times a day (BID) | ORAL | Status: DC

## 2015-12-24 MED ORDER — MELOXICAM 7.5 MG PO TABS: 7.5000 mg | ORAL_TABLET | Freq: Every day | ORAL | Status: DC

## 2015-12-24 NOTE — Progress Notes (Signed)
Subjective:  Patient ID: Kimberly Obrien, female    DOB: 1960/09/12  Age: 55 y.o. MRN: [id number]  CC: Back Pain and Coagulation Disorder   HPI Kimberly Obrien presents for left ear fluttering.  2 months of left L1 region pain radiating downward iinto the hip when very active. Relief with rest.  Worse with lifting, carrying even 5-10 lb.  Pt. Has bulging disc and radicular pain in neck. Feels she needs a neurosurgeon to consider surgery. She has had surgery with Dr. Hermelinda Dellen but insurance change blocks her from seeing him. Dr. Carloyn Manner has seen her but not offerred surgery.   Patient in for follow-up of atrial fibrillation. Patient denies any recent bouts of chest pain or palpitations. Additionally, patient is taking anticoagulants. Patient denies any recent excessive bleeding episodes including epistaxis, bleeding from the gums, genitalia, rectal bleeding or hematuria. Additionally there has been no excessive bruising.   History Kimberly Obrien has a past medical history of Anticoagulation monitoring by pharmacist; PE (pulmonary embolism); Arthritis; COPD (chronic obstructive pulmonary disease) ([place]); Asthma; and Autoimmune hepatitis ([place]).   She has past surgical history that includes Tubal ligation; Wrist surgery (Right); Cholecystectomy; Spine surgery; Abdominal hysterectomy; Eye surgery; left and right heart catheterization with coronary angiogram (N/A, 05/13/2014); and Incontinence surgery (2009).   Her family history includes Cirrhosis in her mother; Heart failure in her father; Hyperlipidemia in her father; Hypertension in her brother; Obesity in her daughter. There is no history of Heart attack.She reports that she quit smoking about 3 years ago. Her smoking use included Cigarettes. She started smoking about 38 years ago. She has a 68 pack-year smoking history. She has never used smokeless tobacco. She reports that she does not drink alcohol or use illicit drugs.    ROS Review of  Systems  Constitutional: Negative for fever, activity change and appetite change.  HENT: Negative for congestion, rhinorrhea and sore throat.   Eyes: Negative for visual disturbance.  Respiratory: Negative for cough and shortness of breath.   Cardiovascular: Negative for chest pain and palpitations.  Gastrointestinal: Negative for nausea, abdominal pain and diarrhea.  Genitourinary: Negative for dysuria.  Musculoskeletal: Positive for myalgias, back pain and arthralgias.    Objective:  BP 134/72 mmHg  Pulse 90  Temp(Src) 97.6 F (36.4 C) (Oral)  Ht 5\' 6"  (1.676 m)  Wt 223 lb 3.2 oz (101.243 kg)  BMI 36.04 kg/m2  SpO2 95%  BP Readings from Last 3 Encounters:  12/24/15 134/72  12/06/15 123/59  12/01/15 117/66    Wt Readings from Last 3 Encounters:  12/24/15 223 lb 3.2 oz (101.243 kg)  12/05/15 220 lb (99.791 kg)  12/01/15 221 lb (100.245 kg)     Physical Exam  Constitutional: She is oriented to person, place, and time. She appears well-developed and well-nourished. No distress.  HENT:  Head: Normocephalic and atraumatic.  Right Ear: External ear normal.  Left Ear: External ear normal.  Nose: Nose normal.  Mouth/Throat: Oropharynx is clear and moist.  Eyes: Conjunctivae and EOM are normal. Pupils are equal, round, and reactive to light.  Neck: Normal range of motion. Neck supple. No thyromegaly present.  Cardiovascular: Normal rate, regular rhythm and normal heart sounds.   No murmur heard. Pulmonary/Chest: Effort normal and breath sounds normal. No respiratory distress. She has no wheezes. She has no rales.  Abdominal: Soft. Bowel sounds are normal. She exhibits no distension. There is no tenderness.  Lymphadenopathy:    She has no cervical adenopathy.  Neurological: She is  alert and oriented to person, place, and time. She has normal reflexes.  Skin: Skin is warm and dry.  Psychiatric: She has a normal mood and affect. Her behavior is normal. Judgment and thought  content normal.     Lab Results  Component Value Date   WBC 14.5* 12/06/2015   HGB 15.0 12/06/2015   HCT 44.0 12/06/2015   PLT 143* 12/06/2015   GLUCOSE 101* 12/06/2015   CHOL 192 09/23/2015   TRIG 165* 09/23/2015   HDL 51 09/23/2015   LDLCALC 108* 09/23/2015   ALT 119* 09/23/2015   AST 71* 09/23/2015   NA 142 12/06/2015   K 3.6 12/06/2015   CL 103 12/06/2015   CREATININE 0.60 12/06/2015   BUN 11 12/06/2015   CO2 23 09/23/2015   TSH 0.441* 09/23/2015   INR 2.6* 12/24/2015   HGBA1C 5.6 09/28/2015    Ct Angio Chest Pe W/cm &/or Wo Cm  12/06/2015  CLINICAL DATA:  Left flank pain.  Non therapeutic INR. EXAM: CT ANGIOGRAPHY CHEST WITH CONTRAST TECHNIQUE: s. IMPRESSION: No pulmonary emboli or other acute abnormalities. Extensive emphysema. Electronically Signed   By: Lorriane Shire M.D.   On: 12/06/2015 07:25   Ct Renal Stone Study  12/06/2015  CLINICAL DATA:  Initial evaluation for acute intermittent left flank pain for 1 week, radiating to the left lower quadrant, worse in last 3 days. Foul-smelling urine. Mild leukocytosis. EXAM: CT ABDOMEN AND PELVIS WITHOUT CONTRAST  IMPRESSION: 1. 2 mm nonobstructive left renal nephrolithiasis. No CT evidence for obstructive uropathy. No significant inflammatory changes about the left kidney on this noncontrast examination. 2. No other acute intra-abdominal or pelvic process. 3. Status post cholecystectomy. Electronically Signed   By: Jeannine Boga M.D.   On: 12/06/2015 03:19    Assessment & Plan:   Sunday was seen today for back pain and coagulation disorder.  Diagnoses and all orders for this visit:  Chronic pulmonary edema -     CoaguChek XS/INR Waived  Other orders -     meloxicam (MOBIC) 7.5 MG tablet; Take 1 tablet (7.5 mg total) by mouth daily. -     amoxicillin-clavulanate (AUGMENTIN) 875-125 MG tablet; Take 1 tablet by mouth 2 (two) times daily. Take all of this medication    I have discontinued Kimberly Obrien's  azithromycin. I am also having her start on meloxicam and amoxicillin-clavulanate. Additionally, I am having her maintain her budesonide-formoterol, albuterol, warfarin, fluticasone, Vitamin D (Ergocalciferol), predniSONE, multivitamin with minerals, ibuprofen, and methocarbamol.  Meds ordered this encounter  Medications  . meloxicam (MOBIC) 7.5 MG tablet    Sig: Take 1 tablet (7.5 mg total) by mouth daily.    Dispense:  30 tablet    Refill:  0  . amoxicillin-clavulanate (AUGMENTIN) 875-125 MG tablet    Sig: Take 1 tablet by mouth 2 (two) times daily. Take all of this medication    Dispense:  60 tablet    Refill:  0     Follow-up: No Follow-up on file.  Claretta Fraise, M.D.

## 2015-12-30 ENCOUNTER — Ambulatory Visit (INDEPENDENT_AMBULATORY_CARE_PROVIDER_SITE_OTHER): Payer: Commercial Managed Care - HMO | Admitting: Internal Medicine

## 2015-12-30 ENCOUNTER — Encounter (INDEPENDENT_AMBULATORY_CARE_PROVIDER_SITE_OTHER): Payer: Self-pay | Admitting: Internal Medicine

## 2015-12-30 VITALS — BP 130/60 | HR 72 | Temp 97.9°F | Ht 66.0 in | Wt 221.3 lb

## 2015-12-30 DIAGNOSIS — K921 Melena: Secondary | ICD-10-CM

## 2015-12-30 DIAGNOSIS — R195 Other fecal abnormalities: Secondary | ICD-10-CM | POA: Diagnosis not present

## 2015-12-30 LAB — CBC WITH DIFFERENTIAL/PLATELET
Basophils Absolute: 0 cells/uL (ref 0–200)
Basophils Relative: 0 %
Eosinophils Absolute: 0 cells/uL — ABNORMAL LOW (ref 15–500)
Eosinophils Relative: 0 %
HCT: 44.6 % (ref 35.0–45.0)
Hemoglobin: 15.4 g/dL (ref 11.7–15.5)
Lymphocytes Relative: 15 %
Lymphs Abs: 1740 cells/uL (ref 850–3900)
MCH: 30 pg (ref 27.0–33.0)
MCHC: 34.5 g/dL (ref 32.0–36.0)
MCV: 86.9 fL (ref 80.0–100.0)
MPV: 9.8 fL (ref 7.5–12.5)
Monocytes Absolute: 464 cells/uL (ref 200–950)
Monocytes Relative: 4 %
Neutro Abs: 9396 cells/uL — ABNORMAL HIGH (ref 1500–7800)
Neutrophils Relative %: 81 %
Platelets: 174 10*3/uL (ref 140–400)
RBC: 5.13 MIL/uL — ABNORMAL HIGH (ref 3.80–5.10)
RDW: 14.3 % (ref 11.0–15.0)
WBC: 11.6 10*3/uL — ABNORMAL HIGH (ref 3.8–10.8)

## 2015-12-30 MED ORDER — DICYCLOMINE HCL 10 MG PO CAPS: 10.0000 mg | ORAL_CAPSULE | Freq: Three times a day (TID) | ORAL | Status: DC

## 2015-12-30 NOTE — Patient Instructions (Signed)
OV in 3 months. CBC today.

## 2015-12-30 NOTE — Progress Notes (Signed)
Subjective:    Patient ID: Kimberly Obrien, female    DOB: Sep 27, 1960, 55 y.o.   MRN: [id number]  HPI Referred by Dr. Quinn Axe for rectal bleeding. She says for the last couple of years she had been guaiac positive.  Her last colonoscopy was by Dr. Amedeo Plenty in 2014 for positive stool card.  Two polyps removed. One 12 mm and one was 12m. Both were tubular adenomas.  She sometimes has left upper quadrant pain.  Her appetite is good. No weight loss. She usually has a BM daily. She occasionally has constipation. She occasionally has acid reflux.  Hx of atrial fib paroxysmal and PE and maintained on Coumadin.  Hx of auto immune hepatitis and is followed by Dr. Amedeo Plenty.  She has appt with Dr. Amedeo Plenty in June. Maintained on Prednisone. Diagnosed with Auto immune hepatitis 1 year ago . She also tells me at times her stools are black. Last black stool over the weekend.     Review of Systems Past Medical History  Diagnosis Date  . Anticoagulation monitoring by pharmacist   . PE (pulmonary embolism)   . Arthritis   . COPD (chronic obstructive pulmonary disease) ([place])   . Asthma   . Autoimmune hepatitis [facility])     Past Surgical History  Procedure Laterality Date  . Tubal ligation    . Wrist surgery Right     Had surgery twice to shave bone for blood circulation improvement.  . Cholecystectomy    . Spine surgery      L5 S1 fusion  . Abdominal hysterectomy    . Eye surgery      Cataracts  . Left and right heart catheterization with coronary angiogram N/A 05/13/2014    Procedure: LEFT AND RIGHT HEART CATHETERIZATION WITH CORONARY ANGIOGRAM;  Surgeon: [name], MD;  Location: [facility] CATH LAB;  Service: Cardiovascular;  Laterality: N/A;  . Incontinence surgery  2009    Allergies  Allergen Reactions  . Aciphex [Rabeprazole Sodium] Rash  . Diflucan [Fluconazole] Rash  . Claritin [Loratadine] Other (See Comments)    Tired   . Lipitor [Atorvastatin] Other (See Comments)    Myalgias,  leg pain.  Problems with pancreas, sugars went up also.  Santiago Bur [Nitrofurantoin Macrocrystal] Nausea And Vomiting  . Metoprolol Nausea And Vomiting  . Nsaids Other (See Comments)    Upset stomach  . Septra [Sulfamethoxazole-Trimethoprim] Nausea And Vomiting  . Crestor [Rosuvastatin] Other (See Comments)    Elevated blood glucose and abdominal pain  . Doxycycline Rash  . Ibuprofen Other (See Comments)    Upset stomach    Current Outpatient Prescriptions on File Prior to Visit  Medication Sig Dispense Refill  . albuterol (PROVENTIL HFA;VENTOLIN HFA) 108 (90 BASE) MCG/ACT inhaler Inhale into the lungs every 6 (six) hours as needed for wheezing or shortness of breath.    Marland Kitchen amoxicillin-clavulanate (AUGMENTIN) 875-125 MG tablet Take 1 tablet by mouth 2 (two) times daily. Take all of this medication 60 tablet 0  . budesonide-formoterol (SYMBICORT) 80-4.5 MCG/ACT inhaler Inhale 2 puffs into the lungs daily. (Patient taking differently: Inhale 2 puffs into the lungs as needed. ) 1 Inhaler 0  . fluticasone (FLONASE) 50 MCG/ACT nasal spray Place 2 sprays into both nostrils daily. 16 g 6  . ibuprofen (ADVIL,MOTRIN) 200 MG tablet Take 200 mg by mouth every 6 (six) hours as needed for moderate pain.    . predniSONE (DELTASONE) 20 MG tablet Take 15 mg by mouth daily with breakfast.     .  Vitamin D, Ergocalciferol, (DRISDOL) 50000 units CAPS capsule Take 1 capsule (50,000 Units total) by mouth every 7 (seven) days. (Patient taking differently: Take 50,000 Units by mouth every Wednesday. ) 12 capsule 3  . warfarin (COUMADIN) 5 MG tablet TAKE ONE-HALF TO ONE TABLET BY MOUTH ONCE DAILY (Patient taking differently: 64m M-W- Friday. Take 2.5mg  all other days of the week) 50 tablet 2   No current facility-administered medications on file prior to visit.        Objective:   Physical Exam Blood pressure 130/60, pulse 72, temperature 97.9 F (36.6 C), height 5\' 6"  (1.676 m), weight 221 lb 4.8 oz (100.381  kg).  Alert and oriented. Skin warm and dry. Oral mucosa is moist.   . Sclera anicteric, conjunctivae is pink. Thyroid not enlarged. No cervical lymphadenopathy. Lungs clear. Heart regular rate and rhythm.  Abdomen is soft. Bowel sounds are positive. No hepatomegaly. No abdominal masses felt. No tenderness.  No edema to lower extremities.  Stool brown and guaiac negative     Assessment & Plan:  Guaiac positive stool. She was negative today. Am going to give her 3 stool cards to take home. If any of her stools are black she will get a specimen and bring back to office.  CBC today.   OV in 3 months. Am going to call in Dicyclomine 10mg  three times a day to her pharmacy.

## 2016-01-02 DIAGNOSIS — J811 Chronic pulmonary edema: Secondary | ICD-10-CM | POA: Diagnosis not present

## 2016-01-02 DIAGNOSIS — J45909 Unspecified asthma, uncomplicated: Secondary | ICD-10-CM | POA: Diagnosis not present

## 2016-01-10 ENCOUNTER — Telehealth (INDEPENDENT_AMBULATORY_CARE_PROVIDER_SITE_OTHER): Payer: Self-pay | Admitting: *Deleted

## 2016-01-10 DIAGNOSIS — J9611 Chronic respiratory failure with hypoxia: Secondary | ICD-10-CM | POA: Diagnosis not present

## 2016-01-10 DIAGNOSIS — K921 Melena: Secondary | ICD-10-CM | POA: Diagnosis not present

## 2016-01-10 DIAGNOSIS — I4891 Unspecified atrial fibrillation: Secondary | ICD-10-CM | POA: Diagnosis not present

## 2016-01-10 DIAGNOSIS — J45909 Unspecified asthma, uncomplicated: Secondary | ICD-10-CM | POA: Diagnosis not present

## 2016-01-10 NOTE — Telephone Encounter (Signed)
   Diagnosis:    Result(s)   Card 1: Negative: collected 01/05/2016    Card 2: Positive: collected 01/07/2016   Card 3: Negative:collected 01/09/2016    Completed by: Cheyne Boulden,LPN   HEMOCCULT SENSA DEVELOPER: LOT#: [id number]  EXPIRATION DATE: 9-17   HEMOCCULT SENSA CARD:  LOT#:  [id number] 12 R EXPIRATION DATE: 12-18   CARD CONTROL RESULTS:  POSITIVE: Positive NEGATIVE: Negative    ADDITIONAL COMMENTS: Forwarded to Deberah Castle , NP - C for review.

## 2016-01-11 DIAGNOSIS — R1012 Left upper quadrant pain: Secondary | ICD-10-CM | POA: Diagnosis not present

## 2016-01-14 ENCOUNTER — Telehealth (INDEPENDENT_AMBULATORY_CARE_PROVIDER_SITE_OTHER): Payer: Self-pay | Admitting: Internal Medicine

## 2016-01-14 NOTE — Telephone Encounter (Signed)
If he does not want to do a colonoscopy, one will be scheduled here.

## 2016-01-14 NOTE — Telephone Encounter (Signed)
Results of hemocults given to patient. She tells me she saw Dr. Amedeo Plenty yesterday and she is scheduled for an EGD on 01/28/2016. I told her I thought she needed a colonoscopy also. Since she is seeing Dr. Amedeo Plenty, I advised her she may want to call his office and let them know she had blood in her stool. He may want to add a colonoscopy. If not we would be happy to proceed with a colonoscopy.  Patient is agreeable.

## 2016-01-18 NOTE — Telephone Encounter (Signed)
error 

## 2016-01-20 ENCOUNTER — Encounter: Payer: Self-pay | Admitting: Pharmacist

## 2016-01-21 ENCOUNTER — Ambulatory Visit (INDEPENDENT_AMBULATORY_CARE_PROVIDER_SITE_OTHER): Payer: Commercial Managed Care - HMO | Admitting: Pharmacist

## 2016-01-21 DIAGNOSIS — J811 Chronic pulmonary edema: Secondary | ICD-10-CM

## 2016-01-21 NOTE — Patient Instructions (Signed)
Anticoagulation Dose Instructions as of 01/21/2016      Kimberly Obrien Tue Wed Thu Fri Sat   New Dose 2.5 mg 5 mg 2.5 mg 2.5 mg 2.5 mg 5 mg 2.5 mg    Description        Hold warfarin today - Friday, June 2nd, 2017. Then decrease dose 1 tablet on mondays and fridays and 1/2 tablet all other days.     INR was 3.4 today

## 2016-01-28 ENCOUNTER — Other Ambulatory Visit: Payer: Self-pay | Admitting: Gastroenterology

## 2016-01-28 DIAGNOSIS — R1012 Left upper quadrant pain: Secondary | ICD-10-CM | POA: Diagnosis not present

## 2016-01-28 DIAGNOSIS — K2950 Unspecified chronic gastritis without bleeding: Secondary | ICD-10-CM | POA: Diagnosis not present

## 2016-01-28 DIAGNOSIS — K2970 Gastritis, unspecified, without bleeding: Secondary | ICD-10-CM | POA: Diagnosis not present

## 2016-02-02 DIAGNOSIS — J811 Chronic pulmonary edema: Secondary | ICD-10-CM | POA: Diagnosis not present

## 2016-02-02 DIAGNOSIS — J45909 Unspecified asthma, uncomplicated: Secondary | ICD-10-CM | POA: Diagnosis not present

## 2016-02-03 DIAGNOSIS — R1012 Left upper quadrant pain: Secondary | ICD-10-CM | POA: Diagnosis not present

## 2016-02-08 ENCOUNTER — Telehealth: Payer: Self-pay | Admitting: Pharmacist

## 2016-02-08 ENCOUNTER — Ambulatory Visit (INDEPENDENT_AMBULATORY_CARE_PROVIDER_SITE_OTHER): Payer: Commercial Managed Care - HMO | Admitting: Pharmacist

## 2016-02-08 DIAGNOSIS — I482 Chronic atrial fibrillation, unspecified: Secondary | ICD-10-CM

## 2016-02-08 DIAGNOSIS — J811 Chronic pulmonary edema: Secondary | ICD-10-CM

## 2016-02-08 LAB — COAGUCHEK XS/INR WAIVED
INR: 1.9 — ABNORMAL HIGH (ref 0.9–1.1)
Prothrombin Time: 23.2 s

## 2016-02-08 NOTE — Telephone Encounter (Signed)
Patient was started on augmentin 875mg  bid 12/24/2015 for chronic sinsusitis (??).  She was suppose to take bid for 4 weeks but only tood for 2 weeks because she was advised by GI to hold prior to endoscopy.  She is planning to restart Augementin today but is not sure how long to take - she only has 2 weeks left but feels like she should take for a full 4 week course per Dr Livia Snellen instructions 12/24/15.  Please advise and send in Rx in 4 weeks course is recommended.

## 2016-02-08 NOTE — Patient Instructions (Signed)
Anticoagulation Dose Instructions as of 02/08/2016      Dorene Grebe Tue Wed Thu Fri Sat   New Dose 2.5 mg 5 mg 2.5 mg 2.5 mg 2.5 mg 5 mg 2.5 mg    Description        Take 1 tablet today (Tuesday, June 20th), then continue current warfarin 5mg  - 1 tablet on mondays and fridays and 1/2 tablet all other days.     INR was 1.9 today

## 2016-02-09 MED ORDER — AMOXICILLIN-POT CLAVULANATE 875-125 MG PO TABS: 1.0000 | ORAL_TABLET | Freq: Two times a day (BID) | ORAL | Status: DC

## 2016-02-09 NOTE — Telephone Encounter (Signed)
The requested med has been sent to the pharmacy.  Please let the patient know. Thanks, WS 

## 2016-02-10 NOTE — Telephone Encounter (Signed)
Patient aware rx sent to pharmacy.

## 2016-02-23 ENCOUNTER — Ambulatory Visit (INDEPENDENT_AMBULATORY_CARE_PROVIDER_SITE_OTHER): Payer: Commercial Managed Care - HMO | Admitting: Pharmacist

## 2016-02-23 ENCOUNTER — Telehealth: Payer: Self-pay | Admitting: Pharmacist

## 2016-02-23 DIAGNOSIS — J811 Chronic pulmonary edema: Secondary | ICD-10-CM

## 2016-02-23 LAB — COAGUCHEK XS/INR WAIVED
INR: 2.7 — ABNORMAL HIGH (ref 0.9–1.1)
Prothrombin Time: 32.2 s

## 2016-02-23 NOTE — Patient Instructions (Signed)
Anticoagulation Dose Instructions as of 02/23/2016      Kimberly Obrien Tue Wed Thu Fri Sat   New Dose 2.5 mg 5 mg 2.5 mg 2.5 mg 2.5 mg 5 mg 2.5 mg    Description        Continue current warfarin 5mg  - 1 tablet on mondays and fridays and 1/2 tablet all other days.     INR was 2.7 today

## 2016-02-28 ENCOUNTER — Other Ambulatory Visit: Payer: Self-pay | Admitting: Family Medicine

## 2016-02-28 DIAGNOSIS — M5440 Lumbago with sciatica, unspecified side: Secondary | ICD-10-CM

## 2016-03-01 LAB — COAGUCHEK XS/INR WAIVED
INR: 3.4 — ABNORMAL HIGH (ref 0.9–1.1)
Prothrombin Time: 41 s

## 2016-03-03 DIAGNOSIS — J45909 Unspecified asthma, uncomplicated: Secondary | ICD-10-CM | POA: Diagnosis not present

## 2016-03-03 DIAGNOSIS — J811 Chronic pulmonary edema: Secondary | ICD-10-CM | POA: Diagnosis not present

## 2016-03-03 NOTE — Telephone Encounter (Signed)
Appears referral was sent by Dr Lyn Records 02/28/16

## 2016-03-10 DIAGNOSIS — K2970 Gastritis, unspecified, without bleeding: Secondary | ICD-10-CM | POA: Diagnosis not present

## 2016-03-10 DIAGNOSIS — R1012 Left upper quadrant pain: Secondary | ICD-10-CM | POA: Diagnosis not present

## 2016-03-10 DIAGNOSIS — R748 Abnormal levels of other serum enzymes: Secondary | ICD-10-CM | POA: Diagnosis not present

## 2016-03-16 ENCOUNTER — Encounter: Payer: Self-pay | Admitting: Family Medicine

## 2016-03-20 ENCOUNTER — Encounter: Payer: Commercial Managed Care - HMO | Admitting: *Deleted

## 2016-03-20 ENCOUNTER — Telehealth: Payer: Self-pay | Admitting: Family Medicine

## 2016-03-20 DIAGNOSIS — N6489 Other specified disorders of breast: Secondary | ICD-10-CM | POA: Diagnosis not present

## 2016-03-20 DIAGNOSIS — Z1231 Encounter for screening mammogram for malignant neoplasm of breast: Secondary | ICD-10-CM | POA: Diagnosis not present

## 2016-03-27 ENCOUNTER — Ambulatory Visit (INDEPENDENT_AMBULATORY_CARE_PROVIDER_SITE_OTHER): Payer: Commercial Managed Care - HMO | Admitting: Pharmacist

## 2016-03-27 DIAGNOSIS — J811 Chronic pulmonary edema: Secondary | ICD-10-CM

## 2016-03-27 DIAGNOSIS — Z86711 Personal history of pulmonary embolism: Secondary | ICD-10-CM | POA: Diagnosis not present

## 2016-03-27 LAB — COAGUCHEK XS/INR WAIVED
INR: 3.9 — ABNORMAL HIGH (ref 0.9–1.1)
Prothrombin Time: 46.9 s

## 2016-04-03 DIAGNOSIS — J811 Chronic pulmonary edema: Secondary | ICD-10-CM | POA: Diagnosis not present

## 2016-04-03 DIAGNOSIS — J45909 Unspecified asthma, uncomplicated: Secondary | ICD-10-CM | POA: Diagnosis not present

## 2016-04-04 ENCOUNTER — Ambulatory Visit (INDEPENDENT_AMBULATORY_CARE_PROVIDER_SITE_OTHER): Payer: Commercial Managed Care - HMO | Admitting: Internal Medicine

## 2016-04-04 ENCOUNTER — Encounter (INDEPENDENT_AMBULATORY_CARE_PROVIDER_SITE_OTHER): Payer: Self-pay

## 2016-04-04 ENCOUNTER — Encounter (INDEPENDENT_AMBULATORY_CARE_PROVIDER_SITE_OTHER): Payer: Self-pay | Admitting: Internal Medicine

## 2016-04-04 VITALS — BP 150/80 | HR 72 | Temp 97.9°F | Ht 66.0 in | Wt 222.5 lb

## 2016-04-04 DIAGNOSIS — R195 Other fecal abnormalities: Secondary | ICD-10-CM

## 2016-04-04 NOTE — Patient Instructions (Addendum)
OV as needed 

## 2016-04-04 NOTE — Progress Notes (Signed)
Subjective:    Patient ID: Kimberly Obrien, female    DOB: 31-Jul-1961, 55 y.o.   MRN: [id number]  HPI Here today for f/u. She was last seen in May for positive stool cards. Her last colonoscopy was in 2014. She underwent an EGD by Dr Yolanda Bonine  ( see below). She tells me she has some bloating. She still says has some blood in her stool. At her last OV in May she said she had a couple of black stools.  Her last colonoscopy was by Dr. Amedeo Plenty in 2014 for positive stool card.  Two polyps removed. One 12 mm and one was 66m. Both were tubular adenomas.  Hx of auto immune hepatitis and maintained on Prednisone.   hx of PE and maintained on Coumadin   01/28/2016 EGD: Dr. Amedeo Plenty. Abdominal pain: Esophagus normal. Scattered moderate inflammation characterized by Adherent blood, erosions, erythema, friability and granularity was found in the gastric antrum.   Review of Systems Past Medical History:  Diagnosis Date  . Anticoagulation monitoring by pharmacist   . Arthritis   . Asthma   . Autoimmune hepatitis ([place])   . COPD (chronic obstructive pulmonary disease) ([place])   . PE (pulmonary embolism)     Past Surgical History:  Procedure Laterality Date  . ABDOMINAL HYSTERECTOMY    . CHOLECYSTECTOMY    . EYE SURGERY     Cataracts  . INCONTINENCE SURGERY  2009  . LEFT AND RIGHT HEART CATHETERIZATION WITH CORONARY ANGIOGRAM N/A 05/13/2014   Procedure: LEFT AND RIGHT HEART CATHETERIZATION WITH CORONARY ANGIOGRAM;  Surgeon: [name], MD;  Location: [facility] CATH LAB;  Service: Cardiovascular;  Laterality: N/A;  . SPINE SURGERY     L5 S1 fusion  . TUBAL LIGATION    . WRIST SURGERY Right    Had surgery twice to shave bone for blood circulation improvement.    Allergies  Allergen Reactions  . Aciphex [Rabeprazole Sodium] Rash  . Diflucan [Fluconazole] Rash  . Claritin [Loratadine] Other (See Comments)    Tired   . Lipitor [Atorvastatin] Other (See Comments)    Myalgias, leg pain.   Problems with pancreas, sugars went up also.  Santiago Bur [Nitrofurantoin Macrocrystal] Nausea And Vomiting  . Metoprolol Nausea And Vomiting  . Nsaids Other (See Comments)    Upset stomach  . Omeprazole     bloating  . Septra [Sulfamethoxazole-Trimethoprim] Nausea And Vomiting  . Crestor [Rosuvastatin] Other (See Comments)    Elevated blood glucose and abdominal pain  . Doxycycline Rash  . Ibuprofen Other (See Comments)    Upset stomach    Current Outpatient Prescriptions on File Prior to Visit  Medication Sig Dispense Refill  . albuterol (PROVENTIL HFA;VENTOLIN HFA) 108 (90 BASE) MCG/ACT inhaler Inhale into the lungs every 6 (six) hours as needed for wheezing or shortness of breath.    . B Complex-C (B-COMPLEX WITH VITAMIN C) tablet Take 1 tablet by mouth daily.    . budesonide-formoterol (SYMBICORT) 80-4.5 MCG/ACT inhaler Inhale 2 puffs into the lungs daily. (Patient taking differently: Inhale 2 puffs into the lungs as needed. ) 1 Inhaler 0  . fluticasone (FLONASE) 50 MCG/ACT nasal spray Place 2 sprays into both nostrils daily. 16 g 6  . predniSONE (DELTASONE) 20 MG tablet Take 10 mg by mouth daily with breakfast.     . Vitamin D, Ergocalciferol, (DRISDOL) 50000 units CAPS capsule Take 1 capsule (50,000 Units total) by mouth every 7 (seven) days. (Patient taking differently: Take 50,000 Units by mouth  every Wednesday. ) 12 capsule 3  . warfarin (COUMADIN) 5 MG tablet TAKE ONE-HALF TO ONE TABLET BY MOUTH ONCE DAILY (Patient taking differently: 74m M-W- Friday. Take 2.5mg  all other days of the week) 50 tablet 2  . amoxicillin-clavulanate (AUGMENTIN) 875-125 MG tablet Take 1 tablet by mouth 2 (two) times daily. For one month. Take all of this medication (Patient not taking: Reported on 04/04/2016) 60 tablet 0  . dicyclomine (BENTYL) 10 MG capsule Take 1 capsule (10 mg total) by mouth 3 (three) times daily before meals. (Patient not taking: Reported on 04/04/2016) 90 capsule 1  . ibuprofen  (ADVIL,MOTRIN) 200 MG tablet Take 200 mg by mouth every 6 (six) hours as needed for moderate pain. Reported on 01/21/2016    . meloxicam (MOBIC) 7.5 MG tablet Take 7.5 mg by mouth daily. Reported on 01/21/2016    . ranitidine (ZANTAC) 300 MG tablet Take 300 mg by mouth daily.     No current facility-administered medications on file prior to visit.        Objective:   Physical Exam Blood pressure (!) 150/80, pulse 72, temperature 97.9 F (36.6 C), height 5\' 6"  (1.676 m), weight 222 lb 8 oz (100.9 kg).  Alert and oriented. Skin warm and dry. Oral mucosa is moist.   . Sclera anicteric, conjunctivae is pink. Thyroid not enlarged. No cervical lymphadenopathy. Lungs clear. Heart regular rate and rhythm.  Abdomen is soft. Bowel sounds are positive. No hepatomegaly. No abdominal masses felt. No tenderness.  No edema to lower extremities.         Assessment & Plan:  GERD. Last EGD in June by Dr. Yolanda Bonine. She will follow up with Dr. Amedeo Plenty.  OV as needed.

## 2016-04-11 DIAGNOSIS — J45909 Unspecified asthma, uncomplicated: Secondary | ICD-10-CM | POA: Diagnosis not present

## 2016-04-11 DIAGNOSIS — G4733 Obstructive sleep apnea (adult) (pediatric): Secondary | ICD-10-CM | POA: Diagnosis not present

## 2016-04-11 DIAGNOSIS — R109 Unspecified abdominal pain: Secondary | ICD-10-CM | POA: Diagnosis not present

## 2016-04-11 DIAGNOSIS — I4891 Unspecified atrial fibrillation: Secondary | ICD-10-CM | POA: Diagnosis not present

## 2016-04-17 ENCOUNTER — Ambulatory Visit (INDEPENDENT_AMBULATORY_CARE_PROVIDER_SITE_OTHER): Payer: Commercial Managed Care - HMO | Admitting: Pharmacist

## 2016-04-17 DIAGNOSIS — I482 Chronic atrial fibrillation, unspecified: Secondary | ICD-10-CM

## 2016-04-17 DIAGNOSIS — J811 Chronic pulmonary edema: Secondary | ICD-10-CM

## 2016-04-17 DIAGNOSIS — Z86711 Personal history of pulmonary embolism: Secondary | ICD-10-CM

## 2016-04-17 DIAGNOSIS — I2699 Other pulmonary embolism without acute cor pulmonale: Secondary | ICD-10-CM | POA: Insufficient documentation

## 2016-04-17 LAB — COAGUCHEK XS/INR WAIVED
INR: 2.4 — ABNORMAL HIGH (ref 0.9–1.1)
Prothrombin Time: 28.3 s

## 2016-05-04 DIAGNOSIS — J45909 Unspecified asthma, uncomplicated: Secondary | ICD-10-CM | POA: Diagnosis not present

## 2016-05-04 DIAGNOSIS — J811 Chronic pulmonary edema: Secondary | ICD-10-CM | POA: Diagnosis not present

## 2016-05-05 ENCOUNTER — Ambulatory Visit: Payer: Commercial Managed Care - HMO | Attending: Pulmonary Disease | Admitting: Neurology

## 2016-05-05 DIAGNOSIS — G4761 Periodic limb movement disorder: Secondary | ICD-10-CM | POA: Diagnosis not present

## 2016-05-05 DIAGNOSIS — G4733 Obstructive sleep apnea (adult) (pediatric): Secondary | ICD-10-CM | POA: Diagnosis not present

## 2016-05-05 DIAGNOSIS — G4730 Sleep apnea, unspecified: Secondary | ICD-10-CM | POA: Diagnosis present

## 2016-05-09 ENCOUNTER — Other Ambulatory Visit (HOSPITAL_BASED_OUTPATIENT_CLINIC_OR_DEPARTMENT_OTHER): Payer: Self-pay

## 2016-05-09 DIAGNOSIS — G4730 Sleep apnea, unspecified: Secondary | ICD-10-CM

## 2016-05-11 NOTE — Procedures (Signed)
[place] A. Merlene Laughter, MD     [email or web address]             NOCTURNAL POLYSOMNOGRAPHY   LOCATION: [facility]   Patient Name: Kimberly Obrien, Kimberly Obrien Date: 05/05/2016 Gender: Female D.O.B: 30-Apr-1961 Age (years): 55 Referring Provider: Lennox Solders Height (inches): 66 Interpreting Physician: Phillips Odor MD, ABSM Weight (lbs): 222 RPSGT: Peak, Robert BMI: 36 MRN: [id number] Neck Size: 18.00 CLINICAL INFORMATION The patient is referred for a split night study with BPAP. MEDICATIONS Medications taken by the patient : N/A  Medications administered by patient during sleep study : No sleep medicine administered.  Current Outpatient Prescriptions:  .  albuterol (PROVENTIL HFA;VENTOLIN HFA) 108 (90 BASE) MCG/ACT inhaler, Inhale into the lungs every 6 (six) hours as needed for wheezing or shortness of breath., Disp: , Rfl:  .  B Complex-C (B-COMPLEX WITH VITAMIN C) tablet, Take 1 tablet by mouth daily., Disp: , Rfl:  .  budesonide-formoterol (SYMBICORT) 80-4.5 MCG/ACT inhaler, Inhale 2 puffs into the lungs daily. (Patient taking differently: Inhale 2 puffs into the lungs as needed. ), Disp: 1 Inhaler, Rfl: 0 .  dicyclomine (BENTYL) 10 MG capsule, Take 1 capsule (10 mg total) by mouth 3 (three) times daily before meals. (Patient not taking: Reported on 04/04/2016), Disp: 90 capsule, Rfl: 1 .  fluticasone (FLONASE) 50 MCG/ACT nasal spray, Place 2 sprays into both nostrils daily., Disp: 16 g, Rfl: 6 .  predniSONE (DELTASONE) 20 MG tablet, Take 10 mg by mouth daily with breakfast. , Disp: , Rfl:  .  Vitamin D, Ergocalciferol, (DRISDOL) 50000 units CAPS capsule, Take 1 capsule (50,000 Units total) by mouth every 7 (seven) days. (Patient taking differently: Take 50,000 Units by mouth every Wednesday. ), Disp: 12 capsule, Rfl: 3 .  warfarin (COUMADIN) 5 MG tablet, TAKE ONE-HALF TO ONE TABLET BY MOUTH ONCE DAILY (Patient taking differently: 85mM-W- Friday. Take 2.53mall  other days of the week), Disp: 50 tablet, Rfl: 2  SLEEP STUDY TECHNIQUE As per the AASM Manual for the Scoring of Sleep and Associated Events v2.3 (April 2016) with a hypopnea requiring 4% desaturations. The channels recorded and monitored were frontal, central and occipital EEG, electrooculogram (EOG), submentalis EMG (chin), nasal and oral airflow, thoracic and abdominal wall motion, anterior tibialis EMG, snore microphone, electrocardiogram, and pulse oximetry. Bi-level positive airway pressure (BiPAP) was initiated when the patient met split night criteria and was titrated according to treat sleep-disordered breathing. RESPIRATORY PARAMETERS Diagnostic Total AHI (/hr): 37.9 RDI (/hr): 46.6 OA Index (/hr): 10.6 CA Index (/hr): 1.9 REM AHI (/hr): N/A NREM AHI (/hr): 37.9 Supine AHI (/hr): 114.5 Non-supine AHI (/hr): 20.76 Min O2 Sat (%): 80.00 Mean O2 (%): 86.67 Time below 88% (min): 110.5   Titration Optimal IPAP Pressure (cm): 12 Optimal EPAP Pressure (cm): 8 AHI at Optimal Pressure (/hr): 4.5 Min O2 at Optimal Pressure (%): 86.0 Sleep % at Optimal (%): 99 Supine % at Optimal (%): 0     SLEEP ARCHITECTURE The study was initiated at 9:47:50 PM and terminated at 4:34:20 AM. The total recorded time was 406.5 minutes. EEG confirmed total sleep time was 245.5 minutes yielding a sleep efficiency of 60.4%. Sleep onset after lights out was 35.4 minutes with a REM latency of 317.5 minutes. The patient spent 12.02% of the night in stage N1 sleep, 53.77% in stage N2 sleep, 12.63% in stage N3 and 21.59% in REM. Wake after sleep onset (WASO) was 125.6 minutes. The Arousal Index was 27.4/hour. LEG MOVEMENT DATA The  total Periodic Limb Movements of Sleep (PLMS) were 72. The PLMS index was 17.60 . CARDIAC DATA The 2 lead EKG demonstrated sinus rhythm. The mean heart rate was 61.95 beats per minute. Other EKG findings include: None.    IMPRESSIONS - Severe obstructive sleep apnea occurred during the  diagnostic portion of the study (AHI = 37.9 /hour). Conventional CPAP was initiated buty the patient developed the emergent central sleep apnea. Consequently, bilevel pressures were initiated. She did well on 12/8. - Moderate periodic limb movements of sleep occurred during the study.      Delano Metz, MD Diplomate, American Board of Sleep Medicine.

## 2016-05-22 ENCOUNTER — Ambulatory Visit (INDEPENDENT_AMBULATORY_CARE_PROVIDER_SITE_OTHER): Payer: Commercial Managed Care - HMO | Admitting: Pharmacist

## 2016-05-22 DIAGNOSIS — J811 Chronic pulmonary edema: Secondary | ICD-10-CM | POA: Diagnosis not present

## 2016-05-22 DIAGNOSIS — Z86711 Personal history of pulmonary embolism: Secondary | ICD-10-CM | POA: Diagnosis not present

## 2016-05-22 DIAGNOSIS — Z7901 Long term (current) use of anticoagulants: Secondary | ICD-10-CM

## 2016-05-22 LAB — COAGUCHEK XS/INR WAIVED
INR: 3.5 — ABNORMAL HIGH (ref 0.9–1.1)
Prothrombin Time: 42.3 s

## 2016-05-29 ENCOUNTER — Other Ambulatory Visit: Payer: Self-pay | Admitting: Family Medicine

## 2016-06-03 DIAGNOSIS — J811 Chronic pulmonary edema: Secondary | ICD-10-CM | POA: Diagnosis not present

## 2016-06-03 DIAGNOSIS — J45909 Unspecified asthma, uncomplicated: Secondary | ICD-10-CM | POA: Diagnosis not present

## 2016-06-14 DIAGNOSIS — R748 Abnormal levels of other serum enzymes: Secondary | ICD-10-CM | POA: Diagnosis not present

## 2016-06-14 DIAGNOSIS — R1012 Left upper quadrant pain: Secondary | ICD-10-CM | POA: Diagnosis not present

## 2016-06-14 DIAGNOSIS — K754 Autoimmune hepatitis: Secondary | ICD-10-CM | POA: Diagnosis not present

## 2016-06-26 ENCOUNTER — Encounter: Payer: Self-pay | Admitting: Pharmacist

## 2016-07-04 DIAGNOSIS — J45909 Unspecified asthma, uncomplicated: Secondary | ICD-10-CM | POA: Diagnosis not present

## 2016-07-04 DIAGNOSIS — J811 Chronic pulmonary edema: Secondary | ICD-10-CM | POA: Diagnosis not present

## 2016-07-07 ENCOUNTER — Telehealth: Payer: Self-pay | Admitting: Family Medicine

## 2016-07-07 NOTE — Telephone Encounter (Signed)
appt made for Tuesday, November 21st at 11:30 - please notify patient

## 2016-07-07 NOTE — Telephone Encounter (Signed)
Patient informed of appointment

## 2016-07-11 ENCOUNTER — Ambulatory Visit (INDEPENDENT_AMBULATORY_CARE_PROVIDER_SITE_OTHER): Payer: Commercial Managed Care - HMO | Admitting: Pharmacist

## 2016-07-11 DIAGNOSIS — J811 Chronic pulmonary edema: Secondary | ICD-10-CM

## 2016-07-11 LAB — COAGUCHEK XS/INR WAIVED
INR: 1.8 — ABNORMAL HIGH (ref 0.9–1.1)
Prothrombin Time: 22 s

## 2016-07-12 DIAGNOSIS — I4891 Unspecified atrial fibrillation: Secondary | ICD-10-CM | POA: Diagnosis not present

## 2016-07-12 DIAGNOSIS — J45909 Unspecified asthma, uncomplicated: Secondary | ICD-10-CM | POA: Diagnosis not present

## 2016-07-12 DIAGNOSIS — G4731 Primary central sleep apnea: Secondary | ICD-10-CM | POA: Diagnosis not present

## 2016-07-12 DIAGNOSIS — G4733 Obstructive sleep apnea (adult) (pediatric): Secondary | ICD-10-CM | POA: Diagnosis not present

## 2016-07-24 ENCOUNTER — Encounter: Payer: Self-pay | Admitting: Family Medicine

## 2016-07-24 ENCOUNTER — Ambulatory Visit (INDEPENDENT_AMBULATORY_CARE_PROVIDER_SITE_OTHER): Payer: Commercial Managed Care - HMO | Admitting: Family Medicine

## 2016-07-24 ENCOUNTER — Ambulatory Visit (INDEPENDENT_AMBULATORY_CARE_PROVIDER_SITE_OTHER): Payer: Commercial Managed Care - HMO

## 2016-07-24 VITALS — BP 138/88 | HR 76 | Temp 97.3°F | Ht 66.0 in | Wt 222.0 lb

## 2016-07-24 DIAGNOSIS — I482 Chronic atrial fibrillation, unspecified: Secondary | ICD-10-CM

## 2016-07-24 DIAGNOSIS — M545 Low back pain, unspecified: Secondary | ICD-10-CM

## 2016-07-24 DIAGNOSIS — H6502 Acute serous otitis media, left ear: Secondary | ICD-10-CM | POA: Diagnosis not present

## 2016-07-24 DIAGNOSIS — N390 Urinary tract infection, site not specified: Secondary | ICD-10-CM | POA: Diagnosis not present

## 2016-07-24 LAB — URINALYSIS, COMPLETE
Bilirubin, UA: NEGATIVE
Glucose, UA: NEGATIVE
Nitrite, UA: NEGATIVE
Protein, UA: NEGATIVE
Specific Gravity, UA: >1.03 — ABNORMAL HIGH (ref 1.005–1.030)
Urobilinogen, Ur: 0.2 mg/dL (ref 0.2–1.0)
pH, UA: 5 (ref 5.0–7.5)

## 2016-07-24 LAB — MICROSCOPIC EXAMINATION
Epithelial Cells (non renal): >10 /hpf — AB (ref 0–10)
Renal Epithel, UA: NONE SEEN /hpf

## 2016-07-24 MED ORDER — AMOXICILLIN-POT CLAVULANATE 875-125 MG PO TABS: 1.0000 | ORAL_TABLET | Freq: Two times a day (BID) | ORAL | 0 refills | Status: DC

## 2016-07-24 MED ORDER — CYCLOBENZAPRINE HCL 10 MG PO TABS: 10.0000 mg | ORAL_TABLET | Freq: Three times a day (TID) | ORAL | 1 refills | Status: DC | PRN

## 2016-07-24 MED ORDER — TRAMADOL HCL 50 MG PO TABS: 50.0000 mg | ORAL_TABLET | Freq: Four times a day (QID) | ORAL | Status: DC | PRN

## 2016-07-24 MED ORDER — PREDNISONE 10 MG PO TABS: ORAL_TABLET | ORAL | 0 refills | Status: DC

## 2016-07-24 NOTE — Progress Notes (Signed)
Subjective:  Patient ID: Kimberly Obrien, female    DOB: [birth year]  Age: 55 y.o. MRN: [id number]  CC: Back Pain (left sided radiates around to lower abd for the last five days, no urinary issues) and Sinusitis (started yesterday, constant pressure, no known fever)   HPI TERY HORIGAN presents for 5 days of 8/10 low back pain. It is a deep ache. It radiates to the left groin area and inguinal area. It is made better temporarily by sitting. That then it'll flare again and she'll have to change positions again. She denies any frequency or dysuria. She has had for back surgeries.. She took ibuprofen for pain in spite of her anticoagulant due to the severity of the pain. She denies bleeding.  Symptoms include congestion, facial pain, nasal congestion, no  fever, non productive cough, post nasal drip and sinus pressure with no fever, chills, night sweats or weight loss. Onset of symptoms was a few days ago, gradually worsening since that time. Pt.is drinking moderate amounts of fluids.     Additionally, patient is taking anticoagulants. Patient denies any recent excessive bleeding episodes including epistaxis, bleeding from the gums, genitalia, rectal bleeding or hematuria. Additionally there has been no excessive bruising. History Shelagh has a past medical history of Anticoagulation monitoring by pharmacist; Arthritis; Asthma; Autoimmune hepatitis ([place]); COPD (chronic obstructive pulmonary disease) ([place]); and PE (pulmonary embolism).   She has a past surgical history that includes Tubal ligation; Wrist surgery (Right); Cholecystectomy; Spine surgery; Abdominal hysterectomy; Eye surgery; left and right heart catheterization with coronary angiogram (N/A, 05/13/2014); and Incontinence surgery (2009).   Her family history includes Cirrhosis in her mother; Heart failure in her father; Hyperlipidemia in her father; Hypertension in her brother; Obesity in her daughter.She reports that she quit  smoking about 4 years ago. Her smoking use included Cigarettes. She started smoking about 38 years ago. She has a 68.00 pack-year smoking history. She has never used smokeless tobacco. She reports that she does not drink alcohol or use drugs.    ROS Review of Systems  Constitutional: Negative for activity change, appetite change, chills and fever.  HENT: Positive for congestion, ear pain (left, onset 4 days ago), postnasal drip, rhinorrhea and sinus pressure. Negative for ear discharge, hearing loss, nosebleeds, sneezing and trouble swallowing.   Respiratory: Negative for chest tightness and shortness of breath.   Cardiovascular: Negative for chest pain and palpitations.  Musculoskeletal: Positive for back pain and myalgias.  Skin: Negative for rash.    Objective:  Temp 97.3 F (36.3 C) (Oral)   Ht 5\' 6"  (1.676 m)   Wt 222 lb (100.7 kg)   BMI 35.83 kg/m   BP Readings from Last 3 Encounters:  04/04/16 (!) 150/80  12/30/15 130/60  12/24/15 134/72    Wt Readings from Last 3 Encounters:  07/24/16 222 lb (100.7 kg)  04/04/16 222 lb 8 oz (100.9 kg)  12/30/15 221 lb 4.8 oz (100.4 kg)     Physical Exam  Constitutional: She is oriented to person, place, and time. She appears well-developed and well-nourished. No distress.  HENT:  Head: Normocephalic and atraumatic.  Right Ear: Tympanic membrane and external ear normal. No decreased hearing is noted.  Left Ear: Tympanic membrane and external ear normal. No decreased hearing is noted.  Nose: Mucosal edema present. Right sinus exhibits no frontal sinus tenderness. Left sinus exhibits no frontal sinus tenderness.  Mouth/Throat: No oropharyngeal exudate or posterior oropharyngeal erythema.  Eyes: Conjunctivae and EOM are normal. Pupils  are equal, round, and reactive to light.  Neck: Normal range of motion. Neck supple. No Brudzinski's sign noted.  Cardiovascular: Normal rate, regular rhythm and normal heart sounds.   No murmur  heard. Pulmonary/Chest: Effort normal and breath sounds normal. No respiratory distress. She has no wheezes. She has no rales.  Abdominal: Soft. Bowel sounds are normal. She exhibits no distension and no mass. There is tenderness (mild to moderate at left inguinal region). There is no rebound and no guarding.  Musculoskeletal: She exhibits tenderness (left L4 - 5 region). She exhibits no edema.       Lumbar back: She exhibits decreased range of motion, tenderness and spasm. She exhibits no deformity and normal pulse.  Lymphadenopathy:       Head (right side): No preauricular adenopathy present.       Head (left side): No preauricular adenopathy present.       Right cervical: No superficial cervical adenopathy present.      Left cervical: No superficial cervical adenopathy present.  Neurological: She is alert and oriented to person, place, and time. She has normal reflexes.  Skin: Skin is warm and dry.  Psychiatric: She has a normal mood and affect. Her behavior is normal. Thought content normal.     Lab Results  Component Value Date   WBC 11.6 (H) 12/30/2015   HGB 15.4 12/30/2015   HCT 44.6 12/30/2015   PLT 174 12/30/2015   GLUCOSE 101 (H) 12/06/2015   CHOL 192 09/23/2015   TRIG 165 (H) 09/23/2015   HDL 51 09/23/2015   LDLCALC 108 (H) 09/23/2015   ALT 119 (H) 09/23/2015   AST 71 (H) 09/23/2015   NA 142 12/06/2015   K 3.6 12/06/2015   CL 103 12/06/2015   CREATININE 0.60 12/06/2015   BUN 11 12/06/2015   CO2 23 09/23/2015   TSH 0.441 (L) 09/23/2015   INR 1.8 (H) 07/11/2016   HGBA1C 5.6 09/28/2015    Ct Angio Chest Pe W/cm &/or Wo Cm  Result Date: 12/06/2015 CLINICAL DATA:  Left flank pain.  Non therapeutic INR. EXAM: CT ANGIOGRAPHY CHEST WITH CONTRAST TECHNIQUE: Multidetector CT imaging of the chest was performed using the standard protocol during bolus administration of intravenous contrast. Multiplanar CT image reconstructions and MIPs were obtained to evaluate the vascular  anatomy. CONTRAST:  100 cc Isovue 370 COMPARISON:  CT angiogram dated 04/30/2014 FINDINGS: Mediastinum/Lymph Nodes: No pulmonary emboli or thoracic aortic dissection identified. No masses or pathologically enlarged lymph nodes identified. Minimal calcification of the coronary arteries and thoracic aorta. Lungs/Pleura: No pulmonary mass, infiltrate, or effusion. Extensive chronic obstructive changes bilaterally particularly in the upper lobes. Minimal linear scarring at both lung bases, stable. Upper abdomen: No acute findings. Musculoskeletal: No chest wall mass or suspicious bone lesions identified. Review of the MIP images confirms the above findings. IMPRESSION: No pulmonary emboli or other acute abnormalities. Extensive emphysema. Electronically Signed   By: Lorriane Shire M.D.   On: 12/06/2015 07:25   Ct Renal Stone Study  Result Date: 12/06/2015 CLINICAL DATA:  Initial evaluation for acute intermittent left flank pain for 1 week, radiating to the left lower quadrant, worse in last 3 days. Foul-smelling urine. Mild leukocytosis. EXAM: CT ABDOMEN AND PELVIS WITHOUT CONTRAST TECHNIQUE: Multidetector CT imaging of the abdomen and pelvis was performed following the standard protocol without IV contrast. COMPARISON:  Prior CT from 05/25/2015. FINDINGS: Mild bibasilar atelectasis/scarring noted within the visualized lung bases. Visualized lungs are otherwise clear. Limited noncontrast evaluation of the liver  is unremarkable. Gallbladder surgically absent. No biliary dilatation. Spleen, adrenal glands, and pancreas demonstrate a normal unenhanced appearance. Right kidney unremarkable without evidence of nephrolithiasis or hydronephrosis. No radiopaque calculi seen along the course of the right renal collecting system. There is no right-sided hydroureter. 2 mm nonobstructive left renal calculus within the interpolar left kidney. No left-sided hydronephrosis. No radiopaque calculi seen along the course of the left  renal collecting system. No left-sided hydroureter. Stomach within normal limits. No evidence for bowel obstruction. No abnormal wall thickening or inflammatory fat stranding seen about the bowels. Appendix is normal. Bladder partially distended but otherwise unremarkable. Uterus is absent. Ovaries grossly unremarkable. No free air or fluid. No pathologically enlarged intra-abdominal or pelvic lymph nodes. Mild aorto bi-iliac atherosclerotic disease. No aneurysm. No acute osseus abnormality. Postsurgical changes noted within the lower lumbar spine. No worrisome lytic or blastic osseous lesions. IMPRESSION: 1. 2 mm nonobstructive left renal nephrolithiasis. No CT evidence for obstructive uropathy. No significant inflammatory changes about the left kidney on this noncontrast examination. 2. No other acute intra-abdominal or pelvic process. 3. Status post cholecystectomy. Electronically Signed   By: Jeannine Boga M.D.   On: 12/06/2015 03:19    Assessment & Plan:   Rychelle was seen today for back pain and sinusitis.  Diagnoses and all orders for this visit:  Acute left-sided low back pain without sciatica -     Urinalysis, Complete -     DG Abd 1 View; Future -     Urine culture  Acute serous otitis media of left ear, recurrence not specified  Chronic atrial fibrillation (HCC)  Other orders -     amoxicillin-clavulanate (AUGMENTIN) 875-125 MG tablet; Take 1 tablet by mouth 2 (two) times daily. Take all of this medication -     cyclobenzaprine (FLEXERIL) 10 MG tablet; Take 1 tablet (10 mg total) by mouth 3 (three) times daily as needed for muscle spasms. -     predniSONE (DELTASONE) 10 MG tablet; Take 5 daily for 3 days followed by 4,3,2 and 1 for 3 days each. -     traMADol (ULTRAM) 50 MG tablet; Take 1 tablet (50 mg total) by mouth 4 (four) times daily as needed for moderate pain. -     Microscopic Examination    KUB - no stone noted UA elevated WBC, bacteria, minimal RBC  I have  discontinued Ms. Morad's dicyclomine, B-complex with vitamin C, and SYMBICORT. I am also having her start on amoxicillin-clavulanate, cyclobenzaprine, predniSONE, and traMADol. Additionally, I am having her maintain her albuterol, fluticasone, Vitamin D (Ergocalciferol), warfarin, predniSONE, predniSONE, and tiotropium.  Meds ordered this encounter  Medications  . tiotropium (SPIRIVA) 18 MCG inhalation capsule    Sig: Place 18 mcg into inhaler and inhale daily.  Marland Kitchen amoxicillin-clavulanate (AUGMENTIN) 875-125 MG tablet    Sig: Take 1 tablet by mouth 2 (two) times daily. Take all of this medication    Dispense:  20 tablet    Refill:  0  . cyclobenzaprine (FLEXERIL) 10 MG tablet    Sig: Take 1 tablet (10 mg total) by mouth 3 (three) times daily as needed for muscle spasms.    Dispense:  90 tablet    Refill:  1  . predniSONE (DELTASONE) 10 MG tablet    Sig: Take 5 daily for 3 days followed by 4,3,2 and 1 for 3 days each.    Dispense:  45 tablet    Refill:  0  . traMADol (ULTRAM) 50 MG tablet  Sig: Take 1 tablet (50 mg total) by mouth 4 (four) times daily as needed for moderate pain.    Dispense:  60 tablet    Refill:  02     Follow-up: Return in about 2 weeks (around 08/07/2016).  Claretta Fraise, M.D.

## 2016-07-24 NOTE — Patient Instructions (Signed)

## 2016-07-26 LAB — URINE CULTURE

## 2016-07-31 ENCOUNTER — Telehealth: Payer: Self-pay | Admitting: Family Medicine

## 2016-07-31 DIAGNOSIS — M545 Low back pain: Secondary | ICD-10-CM

## 2016-07-31 NOTE — Telephone Encounter (Signed)
Advised pt her culture didn't show anything significant suggesting no UTI. Pt is still c/o a lot of back pain. She said something about an MRI or going to a specialist. Please advise?

## 2016-07-31 NOTE — Telephone Encounter (Signed)
Referral placed and pt is aware.

## 2016-07-31 NOTE — Telephone Encounter (Signed)
Refer to ortho please

## 2016-08-01 ENCOUNTER — Encounter: Payer: Self-pay | Admitting: Family Medicine

## 2016-08-01 ENCOUNTER — Ambulatory Visit (INDEPENDENT_AMBULATORY_CARE_PROVIDER_SITE_OTHER): Payer: Commercial Managed Care - HMO | Admitting: Family Medicine

## 2016-08-01 VITALS — BP 114/67 | HR 63 | Temp 96.9°F | Ht 66.0 in | Wt 219.0 lb

## 2016-08-01 DIAGNOSIS — R1032 Left lower quadrant pain: Secondary | ICD-10-CM

## 2016-08-01 MED ORDER — LEVOFLOXACIN 500 MG PO TABS
500.0000 mg | ORAL_TABLET | Freq: Every day | ORAL | 0 refills | Status: DC
Start: 2016-08-01 — End: 2016-08-09

## 2016-08-01 MED ORDER — METRONIDAZOLE 500 MG PO TABS: 500.0000 mg | ORAL_TABLET | Freq: Three times a day (TID) | ORAL | 0 refills | Status: DC

## 2016-08-01 NOTE — Progress Notes (Signed)
Subjective:  Patient ID: Kimberly Obrien, female    DOB: Jun 24, 1961  Age: 55 y.o. MRN: [id number]  CC: Back Pain (pt here today c/o lower back pain that radiates to the abdomen)   HPI [name] presents for back pain continues. Ortho appt. Pending. Referral energy yesterday. Today the patient is in because she has had diarrhea starting 2 days after she left here. It lasted 3 days. She describes it as bilious yellow. Her stool is still loose. It is less frequent than it had been during the three-day episode. She says the prednisone helped when she was taking 40 mg daily. But it seems to quit helping when she dropped to 3 and now to 2 tablets daily. She says the pain radiates to the left lower quadrant and points to the left inguinal region. It is an ache without a cramp. She denies any vaginal discharge. There has been no blood in the stool.   History Neli has a past medical history of Anticoagulation monitoring by pharmacist; Arthritis; Asthma; Autoimmune hepatitis ([place]); COPD (chronic obstructive pulmonary disease) ([place]); and PE (pulmonary embolism).   She has a past surgical history that includes Tubal ligation; Wrist surgery (Right); Cholecystectomy; Spine surgery; Abdominal hysterectomy; Eye surgery; left and right heart catheterization with coronary angiogram (N/A, 05/13/2014); and Incontinence surgery (2009).   Her family history includes Cirrhosis in her mother; Heart failure in her father; Hyperlipidemia in her father; Hypertension in her brother; Obesity in her daughter.She reports that she quit smoking about 4 years ago. Her smoking use included Cigarettes. She started smoking about 38 years ago. She has a 68.00 pack-year smoking history. She has never used smokeless tobacco. She reports that she does not drink alcohol or use drugs.    ROS Review of Systems  Constitutional: Negative for activity change, appetite change and fever.  HENT: Negative for congestion,  rhinorrhea and sore throat.   Eyes: Negative for visual disturbance.  Respiratory: Negative for cough and shortness of breath.   Cardiovascular: Negative for chest pain and palpitations.  Gastrointestinal: Negative for abdominal pain, diarrhea and nausea.  Genitourinary: Positive for flank pain. Negative for dysuria.  Musculoskeletal: Positive for arthralgias, back pain and myalgias.    Objective:  BP 114/67   Pulse 63   Temp (!) 96.9 F (36.1 C) (Oral)   Ht _0  (1.676 m)   Wt 219 lb (99.3 kg)   BMI 35.35 kg/m   BP Readings from Last 3 Encounters:  08/01/16 114/67  07/24/16 138/88  04/04/16 (!) 150/80    Wt Readings from Last 3 Encounters:  08/01/16 219 lb (99.3 kg)  07/24/16 222 lb (100.7 kg)  04/04/16 222 lb 8 oz (100.9 kg)     Physical Exam  Constitutional: She is oriented to person, place, and time. She appears well-developed and well-nourished. No distress.  HENT:  Head: Normocephalic and atraumatic.  Eyes: Conjunctivae are normal. Pupils are equal, round, and reactive to light.  Neck: Normal range of motion. Neck supple. No thyromegaly present.  Cardiovascular: Normal rate, regular rhythm and normal heart sounds.   No murmur heard. Pulmonary/Chest: Effort normal and breath sounds normal. No respiratory distress. She has no wheezes. She has no rales.  Abdominal: Soft. Bowel sounds are normal. She exhibits no distension. Tenderness: severe, left inguinal region - midligament nd just superior.  Musculoskeletal: Normal range of motion. She exhibits tenderness (at left l5 musculature).  Lymphadenopathy:    She has no cervical adenopathy.  Neurological: She is  alert and oriented to person, place, and time.  Skin: Skin is warm and dry.  Psychiatric: She has a normal mood and affect. Her behavior is normal. Judgment and thought content normal.     Lab Results  Component Value Date   WBC 11.6 (H) 12/30/2015   HGB 15.4 12/30/2015   HCT 44.6 12/30/2015   PLT 174  12/30/2015   GLUCOSE 101 (H) 12/06/2015   CHOL 192 09/23/2015   TRIG 165 (H) 09/23/2015   HDL 51 09/23/2015   LDLCALC 108 (H) 09/23/2015   ALT 119 (H) 09/23/2015   AST 71 (H) 09/23/2015   NA 142 12/06/2015   K 3.6 12/06/2015   CL 103 12/06/2015   CREATININE 0.60 12/06/2015   BUN 11 12/06/2015   CO2 23 09/23/2015   TSH 0.441 (L) 09/23/2015   INR 1.8 (H) 07/11/2016   HGBA1C 5.6 09/28/2015    Ct Angio Chest Pe W/cm &/or Wo Cm  Result Date: 12/06/2015 CLINICAL DATA:  Left flank pain.  Non therapeutic INR. EXAM: CT ANGIOGRAPHY CHEST WITH CONTRAST TECHNIQUE: Multidetector CT imaging of the chest was performed using the standard protocol during bolus administration of intravenous contrast. Multiplanar CT image reconstructions and MIPs were obtained to evaluate the vascular anatomy. CONTRAST:  100 cc Isovue 370 COMPARISON:  CT angiogram dated 04/30/2014 FINDINGS: Mediastinum/Lymph Nodes: No pulmonary emboli or thoracic aortic dissection identified. No masses or pathologically enlarged lymph nodes identified. Minimal calcification of the coronary arteries and thoracic aorta. Lungs/Pleura: No pulmonary mass, infiltrate, or effusion. Extensive chronic obstructive changes bilaterally particularly in the upper lobes. Minimal linear scarring at both lung bases, stable. Upper abdomen: No acute findings. Musculoskeletal: No chest wall mass or suspicious bone lesions identified. Review of the MIP images confirms the above findings. IMPRESSION: No pulmonary emboli or other acute abnormalities. Extensive emphysema. Electronically Signed   By: Lorriane Shire M.D.   On: 12/06/2015 07:25   Ct Renal Stone Study  Result Date: 12/06/2015 CLINICAL DATA:  Initial evaluation for acute intermittent left flank pain for 1 week, radiating to the left lower quadrant, worse in last 3 days. Foul-smelling urine. Mild leukocytosis. EXAM: CT ABDOMEN AND PELVIS WITHOUT CONTRAST TECHNIQUE: Multidetector CT imaging of the abdomen  and pelvis was performed following the standard protocol without IV contrast. COMPARISON:  Prior CT from 05/25/2015. FINDINGS: Mild bibasilar atelectasis/scarring noted within the visualized lung bases. Visualized lungs are otherwise clear. Limited noncontrast evaluation of the liver is unremarkable. Gallbladder surgically absent. No biliary dilatation. Spleen, adrenal glands, and pancreas demonstrate a normal unenhanced appearance. Right kidney unremarkable without evidence of nephrolithiasis or hydronephrosis. No radiopaque calculi seen along the course of the right renal collecting system. There is no right-sided hydroureter. 2 mm nonobstructive left renal calculus within the interpolar left kidney. No left-sided hydronephrosis. No radiopaque calculi seen along the course of the left renal collecting system. No left-sided hydroureter. Stomach within normal limits. No evidence for bowel obstruction. No abnormal wall thickening or inflammatory fat stranding seen about the bowels. Appendix is normal. Bladder partially distended but otherwise unremarkable. Uterus is absent. Ovaries grossly unremarkable. No free air or fluid. No pathologically enlarged intra-abdominal or pelvic lymph nodes. Mild aorto bi-iliac atherosclerotic disease. No aneurysm. No acute osseus abnormality. Postsurgical changes noted within the lower lumbar spine. No worrisome lytic or blastic osseous lesions. IMPRESSION: 1. 2 mm nonobstructive left renal nephrolithiasis. No CT evidence for obstructive uropathy. No significant inflammatory changes about the left kidney on this noncontrast examination. 2. No other acute  intra-abdominal or pelvic process. 3. Status post cholecystectomy. Electronically Signed   By: Jeannine Boga M.D.   On: 12/06/2015 03:19    Assessment & Plan:   Layan was seen today for back pain.  Diagnoses and all orders for this visit:  Abdominal pain, LLQ -     Cdiff NAA+O+P+Stool Culture -      Giardia/Cryptosporidium EIA -     CBC with Differential/Platelet -     CT ABDOMEN PELVIS W WO CONTRAST; Future -     CMP14+EGFR  Other orders -     metroNIDAZOLE (FLAGYL) 500 MG tablet; Take 1 tablet (500 mg total) by mouth 3 (three) times daily. -     levofloxacin (LEVAQUIN) 500 MG tablet; Take 1 tablet (500 mg total) by mouth daily. For 10 days    I am having Ms. Goodpasture start on metroNIDAZOLE and levofloxacin. I am also having her maintain her albuterol, fluticasone, Vitamin D (Ergocalciferol), warfarin, tiotropium, amoxicillin-clavulanate, cyclobenzaprine, predniSONE, and traMADol.  Meds ordered this encounter  Medications  . metroNIDAZOLE (FLAGYL) 500 MG tablet    Sig: Take 1 tablet (500 mg total) by mouth 3 (three) times daily.    Dispense:  21 tablet    Refill:  0  . levofloxacin (LEVAQUIN) 500 MG tablet    Sig: Take 1 tablet (500 mg total) by mouth daily. For 10 days    Dispense:  10 tablet    Refill:  0     Follow-up: Return in about 3 days (around 08/04/2016).  Claretta Fraise, M.D.

## 2016-08-02 ENCOUNTER — Other Ambulatory Visit: Payer: Self-pay

## 2016-08-02 ENCOUNTER — Other Ambulatory Visit: Payer: Commercial Managed Care - HMO

## 2016-08-02 DIAGNOSIS — R1032 Left lower quadrant pain: Secondary | ICD-10-CM | POA: Diagnosis not present

## 2016-08-02 LAB — CBC WITH DIFFERENTIAL/PLATELET
Basophils Absolute: 0 10*3/uL (ref 0.0–0.2)
Basos: 0 %
EOS (ABSOLUTE): 0 10*3/uL (ref 0.0–0.4)
Eos: 0 %
Hematocrit: 44.6 % (ref 34.0–46.6)
Hemoglobin: 14.8 g/dL (ref 11.1–15.9)
Immature Grans (Abs): 0 10*3/uL (ref 0.0–0.1)
Immature Granulocytes: 0 %
Lymphocytes Absolute: 1.1 10*3/uL (ref 0.7–3.1)
Lymphs: 9 %
MCH: 28.6 pg (ref 26.6–33.0)
MCHC: 33.2 g/dL (ref 31.5–35.7)
MCV: 86 fL (ref 79–97)
Monocytes Absolute: 0.3 10*3/uL (ref 0.1–0.9)
Monocytes: 2 %
Neutrophils Absolute: 11.5 10*3/uL — ABNORMAL HIGH (ref 1.4–7.0)
Neutrophils: 89 %
Platelets: 181 10*3/uL (ref 150–379)
RBC: 5.18 x10E6/uL (ref 3.77–5.28)
RDW: 14 % (ref 12.3–15.4)
WBC: 13 10*3/uL — ABNORMAL HIGH (ref 3.4–10.8)

## 2016-08-02 LAB — CMP14+EGFR
ALT: 92 IU/L — ABNORMAL HIGH (ref 0–32)
AST: 64 IU/L — ABNORMAL HIGH (ref 0–40)
Albumin/Globulin Ratio: 1.7 (ref 1.2–2.2)
Albumin: 3.9 g/dL (ref 3.5–5.5)
Alkaline Phosphatase: 81 IU/L (ref 39–117)
BUN/Creatinine Ratio: 15 (ref 9–23)
BUN: 13 mg/dL (ref 6–24)
Bilirubin Total: 0.7 mg/dL (ref 0.0–1.2)
CO2: 26 mmol/L (ref 18–29)
Calcium: 9.3 mg/dL (ref 8.7–10.2)
Chloride: 100 mmol/L (ref 96–106)
Creatinine, Ser: 0.86 mg/dL (ref 0.57–1.00)
GFR calc Af Amer: 88 mL/min/{1.73_m2} (ref >59)
GFR calc non Af Amer: 76 mL/min/{1.73_m2} (ref >59)
Globulin, Total: 2.3 g/dL (ref 1.5–4.5)
Glucose: 118 mg/dL — ABNORMAL HIGH (ref 65–99)
Potassium: 3.6 mmol/L (ref 3.5–5.2)
Sodium: 142 mmol/L (ref 134–144)
Total Protein: 6.2 g/dL (ref 6.0–8.5)

## 2016-08-03 ENCOUNTER — Telehealth: Payer: Self-pay | Admitting: Family Medicine

## 2016-08-03 DIAGNOSIS — J45909 Unspecified asthma, uncomplicated: Secondary | ICD-10-CM | POA: Diagnosis not present

## 2016-08-03 DIAGNOSIS — J811 Chronic pulmonary edema: Secondary | ICD-10-CM | POA: Diagnosis not present

## 2016-08-03 LAB — GIARDIA/CRYPTOSPORIDIUM EIA
Cryptosporidium EIA: NEGATIVE
Giardia Ag, Stl: NEGATIVE

## 2016-08-03 NOTE — Telephone Encounter (Signed)
Please review and advise.

## 2016-08-04 ENCOUNTER — Telehealth: Payer: Self-pay | Admitting: Family Medicine

## 2016-08-04 ENCOUNTER — Other Ambulatory Visit: Payer: Self-pay | Admitting: Family Medicine

## 2016-08-04 DIAGNOSIS — M545 Low back pain, unspecified: Secondary | ICD-10-CM

## 2016-08-04 MED ORDER — PREDNISONE 10 MG PO TABS: ORAL_TABLET | ORAL | 0 refills | Status: DC

## 2016-08-04 NOTE — Telephone Encounter (Signed)
Pt notified of RX 

## 2016-08-04 NOTE — Telephone Encounter (Signed)
Pt notified of results and of RXs Pt verbalizes understanding

## 2016-08-04 NOTE — Telephone Encounter (Signed)
I sent in the requested prescription 

## 2016-08-07 ENCOUNTER — Other Ambulatory Visit: Payer: Self-pay | Admitting: Family Medicine

## 2016-08-07 LAB — CDIFF NAA+O+P+STOOL CULTURE
E coli, Shiga toxin Assay: NEGATIVE
Toxigenic C. Difficile by PCR: POSITIVE — AB

## 2016-08-07 MED ORDER — METRONIDAZOLE 500 MG PO TABS: 500.0000 mg | ORAL_TABLET | Freq: Three times a day (TID) | ORAL | 0 refills | Status: DC

## 2016-08-09 ENCOUNTER — Ambulatory Visit (INDEPENDENT_AMBULATORY_CARE_PROVIDER_SITE_OTHER): Payer: Commercial Managed Care - HMO | Admitting: Pharmacist

## 2016-08-09 ENCOUNTER — Encounter: Payer: Self-pay | Admitting: Pharmacist

## 2016-08-09 VITALS — BP 122/70 | HR 74 | Ht 66.0 in | Wt 217.5 lb

## 2016-08-09 DIAGNOSIS — I482 Chronic atrial fibrillation, unspecified: Secondary | ICD-10-CM

## 2016-08-09 DIAGNOSIS — Z0000 Encounter for general adult medical examination without abnormal findings: Secondary | ICD-10-CM

## 2016-08-09 DIAGNOSIS — J811 Chronic pulmonary edema: Secondary | ICD-10-CM

## 2016-08-09 DIAGNOSIS — Z86711 Personal history of pulmonary embolism: Secondary | ICD-10-CM

## 2016-08-09 LAB — COAGUCHEK XS/INR WAIVED
INR: 2.6 — ABNORMAL HIGH (ref 0.9–1.1)
Prothrombin Time: 31 s

## 2016-08-09 NOTE — Progress Notes (Signed)
Patient ID: Kimberly Obrien, female   DOB: [birth year], 55 y.o.   MRN: [id number]    Subjective:   Kimberly Obrien is a 55 y.o. married, female who presents for a subsequent Medicare Annual Wellness Visit and to have INR rechecked.   Kimberly Obrien is married and lives with he husband in [place].  She use to work in [employer] but since 2009 she has been on disability.  She does care for her grandchildren about 3 days per week.  She has 4 adult children.   Kimberly Obrien has had a lot of abdominal pain in the last month.  Recently started metronidazole for C-Diff infection.  She is scheduled for abdominal CT tomorrow.   Current Medications (verified) Outpatient Encounter Prescriptions as of 08/09/2016  Medication Sig  . albuterol (PROVENTIL HFA;VENTOLIN HFA) 108 (90 BASE) MCG/ACT inhaler Inhale into the lungs every 6 (six) hours as needed for wheezing or shortness of breath.  . fluticasone (FLONASE) 50 MCG/ACT nasal spray Place 2 sprays into both nostrils daily.  . metroNIDAZOLE (FLAGYL) 500 MG tablet Take 1 tablet (500 mg total) by mouth 3 (three) times daily.  Marland Kitchen tiotropium (SPIRIVA) 18 MCG inhalation capsule Place 18 mcg into inhaler and inhale daily.  . traMADol (ULTRAM) 50 MG tablet Take 1 tablet (50 mg total) by mouth 4 (four) times daily as needed for moderate pain.  . Vitamin D, Ergocalciferol, (DRISDOL) 50000 units CAPS capsule Take 1 capsule (50,000 Units total) by mouth every 7 (seven) days.  Marland Kitchen warfarin (COUMADIN) 5 MG tablet TAKE ONE-HALF TO ONE TABLET BY MOUTH ONCE DAILY  . [DISCONTINUED] amoxicillin-clavulanate (AUGMENTIN) 875-125 MG tablet Take 1 tablet by mouth 2 (two) times daily. Take all of this medication (Patient not taking: Reported on 08/09/2016)  . [DISCONTINUED] cyclobenzaprine (FLEXERIL) 10 MG tablet Take 1 tablet (10 mg total) by mouth 3 (three) times daily as needed for muscle spasms. (Patient not taking: Reported on 08/09/2016)  . [DISCONTINUED] levofloxacin  (LEVAQUIN) 500 MG tablet Take 1 tablet (500 mg total) by mouth daily. For 10 days (Patient not taking: Reported on 08/09/2016)  . [DISCONTINUED] predniSONE (DELTASONE) 10 MG tablet Take 5 daily for 3 days followed by 4,3,2 and 1 for 3 days each. (Patient not taking: Reported on 08/09/2016)   No facility-administered encounter medications on file as of 08/09/2016.     Allergies (verified) Aciphex [rabeprazole sodium]; Diflucan [fluconazole]; Claritin [loratadine]; Crestor [rosuvastatin]; Doxycycline; Ibuprofen; Lipitor [atorvastatin]; Macrobid [nitrofurantoin macrocrystal]; Metoprolol; Nsaids; Omeprazole; Rabeprazole; and Septra [sulfamethoxazole-trimethoprim]   History: Past Medical History:  Diagnosis Date  . Anticoagulation monitoring by pharmacist   . Arthritis   . Asthma   . Autoimmune hepatitis ([place])   . Cataract   . COPD (chronic obstructive pulmonary disease) ([place])   . Hyperlipidemia   . PE (pulmonary embolism)    Past Surgical History:  Procedure Laterality Date  . ABDOMINAL HYSTERECTOMY    . CATARACT EXTRACTION, BILATERAL Bilateral   . CHOLECYSTECTOMY    . EYE SURGERY     Cataracts  . INCONTINENCE SURGERY  2009  . LEFT AND RIGHT HEART CATHETERIZATION WITH CORONARY ANGIOGRAM N/A 05/13/2014   Procedure: LEFT AND RIGHT HEART CATHETERIZATION WITH CORONARY ANGIOGRAM;  Surgeon: [name], MD;  Location: [facility] CATH LAB;  Service: Cardiovascular;  Laterality: N/A;  . SPINE SURGERY     L5 S1 fusion  . TUBAL LIGATION    . WRIST SURGERY Right    Had surgery twice to shave bone for blood circulation improvement.  Family History  Problem Relation Age of Onset  . Cirrhosis Mother   . Hyperlipidemia Father   . Heart failure Father   . Obesity Daughter   . Hypertension Brother   . Heart attack Neg Hx    Social History   Occupational History  . Unemployed    Social History Main Topics  . Smoking status: Former Smoker    Packs/day: 2.00    Years: 50.00     Types: Cigarettes    Start date: 08/22/1977    Quit date: 04/21/2012  . Smokeless tobacco: Never Used  . Alcohol use No  . Drug use: No  . Sexual activity: Yes   CT of chest done 11/2015  Do you feel safe at home?  Yes  Dietary issues and exercise activities: Current Exercise Habits: The patient does not participate in regular exercise at present, Exercise limited by: orthopedic condition(s);respiratory conditions(s) Current Dietary habits:  She is not following any specific diet   Objective:    Today's Vitals   08/09/16 1147  BP: 122/70  Pulse: 74  Weight: 217 lb 8 oz (98.7 kg)  Height: 5\' 6"  (1.676 m)  PainSc: 4   PainLoc: Abdomen   Body mass index is 35.11 kg/m.   INR was 2.6 today  Activities of Daily Living In your present state of health, do you have any difficulty performing the following activities: 08/09/2016  Hearing? N  Vision? N  Difficulty concentrating or making decisions? N  Walking or climbing stairs? Y  Dressing or bathing? N  Doing errands, shopping? Y  Preparing Food and eating ? N  Using the Toilet? N  In the past six months, have you accidently leaked urine? N  Do you have problems with loss of bowel control? N  Managing your Medications? N  Managing your Finances? N  Housekeeping or managing your Housekeeping? Y  Some recent data might be hidden    Are there smokers in your home (other than you)? No   Cardiac Risk Factors include: dyslipidemia;family history of premature cardiovascular disease;hypertension;obesity (BMI >30kg/m2);sedentary lifestyle;smoking/ tobacco exposure  Depression Screen PHQ 2/9 Scores 08/09/2016 08/01/2016 07/24/2016 10/28/2015  PHQ - 2 Score 1 0 0 0    Fall Risk Fall Risk  08/09/2016 08/01/2016 10/28/2015 08/17/2015 07/19/2015  Falls in the past year? No No No No No    Cognitive Function: MMSE - Mini Mental State Exam 08/09/2016 07/19/2015  Orientation to time 5 5  Orientation to Place 5 5  Registration 3 3    Attention/ Calculation 5 5  Recall 3 3  Language- name 2 objects 2 2  Language- repeat 1 1  Language- follow 3 step command 3 3  Language- read & follow direction 1 1  Write a sentence 1 1  Copy design 1 1  Total score 30 30    Immunizations and Health Maintenance  There is no immunization history on file for this patient. Health Maintenance Due  Topic Date Due  . HIV Screening  08/23/1975    Patient Care Team: Claretta Fraise, MD as PCP - General (Family Medicine) Sinda Du, MD as Consulting Physician (Pulmonary Disease) Teena Irani, MD as Consulting Physician (Gastroenterology) [name], MD as Consulting Physician (Cardiology)  Indicate any recent Medical Services you may have received from other than [facility] providers in the past year (date may be approximate).    Assessment:    Annual Wellness Visit  Therapeutic anticoagulation   Screening Tests Health Maintenance  Topic Date Due  .  HIV Screening  08/23/1975  . INFLUENZA VACCINE  11/18/2016 (Originally 03/21/2016)  . MAMMOGRAM  03/20/2018  . PAP SMEAR  09/22/2018  . TETANUS/TDAP  08/21/2020  . COLONOSCOPY  08/02/2023  . Hepatitis C Screening  Completed        Plan:   During the course of the visit Tilisa was educated and counseled about the following appropriate screening and preventive services:   Vaccines to include Pneumoccal, Influenza, Hepatitis B, Td, Zostavax - patient declined all vaccines including influcenza  Colorectal cancer screening - done 2014, UTD  Cardiovascular disease screening - UTD  Lipids were WNL and BP at goal today   Diabetes screening - last BG was 118 but this was not fasting - last A1c = 5.6% 09/2015  Bone Denisty / Osteoporosis Screening - UTD  Mammogram - UTD  PAP - UTD  Glaucoma screening / Diabetic Eye Exam - UTD  Nutrition counseling - Discussed limiting serving sizes to help decrease weight / BMI  Past smoker - CT of lung done  11/2015  Encouraged to do chair exercise - handout given  Advanced Directives - copy of AD given and discussed.   RTC in 1 week to check INR  RTC in 2 weeks to see PCP   Anticoagulation Dose Instructions as of 08/09/2016      Dorene Grebe Tue Wed Thu Fri Sat   New Dose 2.5 mg 2.5 mg 2.5 mg Hold 2.5 mg 2.5 mg Hold   Alt Week 2.5 mg 2.5 mg Hold 2.5 mg 2.5 mg 2.5 mg 2.5 mg    Description   While taking metronidazole - no warfarin 12/20, 12/23 or 12/26.  Then resume usual warfarin dose of 2.5mg  daily      Patient Instructions (the written plan) were given to the patient.   Cherre Robins, PharmD, CPP   08/09/2016

## 2016-08-09 NOTE — Patient Instructions (Addendum)
Anticoagulation Dose Instructions as of 08/09/2016      Dorene Grebe Tue Wed Thu Fri Sat   12/20 thru 12/23    Hold 2.5 mg 2.5 mg Hold   12/24 thru 12/30 2.5 mg 2.5 mg Hold 2.5 mg 2.5 mg 2.5 mg 2.5 mg    Description   While taking metronidazole - no warfarin 12/20, 12/23 or 12/26.  Then resume usual warfarin dose of 2.72m daily    INR was 2.6 today   Ms. MAlroy Dust, Thank you for taking time to come for your Medicare Wellness Visit. I appreciate your ongoing commitment to your health goals. Please review the following plan we discussed and let me know if I can assist you in the future.   These are the goals we discussed:  Increase non-starchy vegetables - carrots, green bean, squash, zucchini, tomatoes, onions, peppers, spinach and other green leafy vegetables, cabbage, lettuce, cucumbers, asparagus, okra (not fried), eggplant Limit sugar and processed foods (cakes, cookies, ice cream, crackers and chips) Increase fresh fruit but limit serving sizes 1/2 cup or about the size of tennis or baseball Limit red meat to no more than 1-2 times per week (serving size about the size of your palm) Choose whole grains / lean proteins - whole wheat bread, quinoa, whole grain rice (1/2 cup), fish, chicken, tKu[place]void sugar and calorie containing beverages - soda, sweet tea and juice.  Choose water or unsweetened tea instead.   This is a list of the screening recommended for you and due dates:  Health Maintenance  Topic Date Due  . HIV Screening  08/23/1975  . Flu Shot  11/18/2016*  . Mammogram  03/20/2018  . Pap Smear  09/22/2018  . Tetanus Vaccine  08/21/2020  . Colon Cancer Screening  08/02/2023  .  Hepatitis C: One time screening is recommended by Center for Disease Control  (CDC) for  adults born from [birth year]through 1965.   Completed  *Topic was postponed. The date shown is not the original due date.    Health Maintenance for Postmenopausal Women Introduction Menopause is a normal process  in which your reproductive ability comes to an end. This process happens gradually over a span of months to years, usually between the ages of 480and 544 Menopause is complete when you have missed 12 consecutive menstrual periods. It is important to talk with your health care provider about some of the most common conditions that affect postmenopausal women, such as heart disease, cancer, and bone loss (osteoporosis). Adopting a healthy lifestyle and getting preventive care can help to promote your health and wellness. Those actions can also lower your chances of developing some of these common conditions. What should I know about menopause? During menopause, you may experience a number of symptoms, such as:  Moderate-to-severe hot flashes.  Night sweats.  Decrease in sex drive.  Mood swings.  Headaches.  Tiredness.  Irritability.  Memory problems.  Insomnia. Choosing to treat or not to treat menopausal changes is an individual decision that you make with your health care provider. What should I know about hormone replacement therapy and supplements? Hormone therapy products are effective for treating symptoms that are associated with menopause, such as hot flashes and night sweats. Hormone replacement carries certain risks, especially as you become older. If you are thinking about using estrogen or estrogen with progestin treatments, discuss the benefits and risks with your health care provider. What should I know about heart disease and stroke? Heart disease, heart attack,  and stroke become more likely as you age. This may be due, in part, to the hormonal changes that your body experiences during menopause. These can affect how your body processes dietary fats, triglycerides, and cholesterol. Heart attack and stroke are both medical emergencies. There are many things that you can do to help prevent heart disease and stroke:  Have your blood pressure checked at least every 1-2 years.  High blood pressure causes heart disease and increases the risk of stroke.  If you are 13-22 years old, ask your health care provider if you should take aspirin to prevent a heart attack or a stroke.  Do not use any tobacco products, including cigarettes, chewing tobacco, or electronic cigarettes. If you need help quitting, ask your health care provider.  It is important to eat a healthy diet and maintain a healthy weight.  Be sure to include plenty of vegetables, fruits, low-fat dairy products, and lean protein.  Avoid eating foods that are high in solid fats, added sugars, or salt (sodium).  Get regular exercise. This is one of the most important things that you can do for your health.  Try to exercise for at least 150 minutes each week. The type of exercise that you do should increase your heart rate and make you sweat. This is known as moderate-intensity exercise.  Try to do strengthening exercises at least twice each week. Do these in addition to the moderate-intensity exercise.  Know your numbers.Ask your health care provider to check your cholesterol and your blood glucose. Continue to have your blood tested as directed by your health care provider. What should I know about cancer screening? There are several types of cancer. Take the following steps to reduce your risk and to catch any cancer development as early as possible. Breast Cancer  Practice breast self-awareness.  This means understanding how your breasts normally appear and feel.  It also means doing regular breast self-exams. Let your health care provider know about any changes, no matter how small.  If you are 2 or older, have a clinician do a breast exam (clinical breast exam or CBE) every year. Depending on your age, family history, and medical history, it may be recommended that you also have a yearly breast X-ray (mammogram).  If you have a family history of breast cancer, talk with your health care provider  about genetic screening.  If you are at high risk for breast cancer, talk with your health care provider about having an MRI and a mammogram every year.  Breast cancer (BRCA) gene test is recommended for women who have family members with BRCA-related cancers. Results of the assessment will determine the need for genetic counseling and BRCA1 and for BRCA2 testing. BRCA-related cancers include these types:  Breast. This occurs in males or females.  Ovarian.  Tubal. This may also be called fallopian tube cancer.  Cancer of the abdominal or pelvic lining (peritoneal cancer).  Prostate.  Pancreatic. Cervical, Uterine, and Ovarian Cancer  Your health care provider may recommend that you be screened regularly for cancer of the pelvic organs. These include your ovaries, uterus, and vagina. This screening involves a pelvic exam, which includes checking for microscopic changes to the surface of your cervix (Pap test).  For women ages 21-65, health care providers may recommend a pelvic exam and a Pap test every three years. For women ages 39-65, they may recommend the Pap test and pelvic exam, combined with testing for human papilloma virus (HPV), every five years.  Some types of HPV increase your risk of cervical cancer. Testing for HPV may also be done on women of any age who have unclear Pap test results.  Other health care providers may not recommend any screening for nonpregnant women who are considered low risk for pelvic cancer and have no symptoms. Ask your health care provider if a screening pelvic exam is right for you.  If you have had past treatment for cervical cancer or a condition that could lead to cancer, you need Pap tests and screening for cancer for at least 20 years after your treatment. If Pap tests have been discontinued for you, your risk factors (such as having a new sexual partner) need to be reassessed to determine if you should start having screenings again. Some women have  medical problems that increase the chance of getting cervical cancer. In these cases, your health care provider may recommend that you have screening and Pap tests more often.  If you have a family history of uterine cancer or ovarian cancer, talk with your health care provider about genetic screening.  If you have vaginal bleeding after reaching menopause, tell your health care provider.  There are currently no reliable tests available to screen for ovarian cancer. Lung Cancer  Lung cancer screening is recommended for adults 60-58 years old who are at high risk for lung cancer because of a history of smoking. A yearly low-dose CT scan of the lungs is recommended if you:  Currently smoke.  Have a history of at least 30 pack-years of smoking and you currently smoke or have quit within the past 15 years. A pack-year is smoking an average of one pack of cigarettes per day for one year. Yearly screening should:  Continue until it has been 15 years since you quit.  Stop if you develop a health problem that would prevent you from having lung cancer treatment. Colorectal Cancer  This type of cancer can be detected and can often be prevented.  Routine colorectal cancer screening usually begins at age 57 and continues through age 28.  If you have risk factors for colon cancer, your health care provider may recommend that you be screened at an earlier age.  If you have a family history of colorectal cancer, talk with your health care provider about genetic screening.  Your health care provider may also recommend using home test kits to check for hidden blood in your stool.  A small camera at the end of a tube can be used to examine your colon directly (sigmoidoscopy or colonoscopy). This is done to check for the earliest forms of colorectal cancer.  Direct examination of the colon should be repeated every 5-10 years until age 50. However, if early forms of precancerous polyps or small growths  are found or if you have a family history or genetic risk for colorectal cancer, you may need to be screened more often. Skin Cancer  Check your skin from head to toe regularly.  Monitor any moles. Be sure to tell your health care provider:  About any new moles or changes in moles, especially if there is a change in a mole's shape or color.  If you have a mole that is larger than the size of a pencil eraser.  If any of your family members has a history of skin cancer, especially at a young age, talk with your health care provider about genetic screening.  Always use sunscreen. Apply sunscreen liberally and repeatedly throughout the day.  Whenever  you are outside, protect yourself by wearing long sleeves, pants, a wide-brimmed hat, and sunglasses. What should I know about osteoporosis? Osteoporosis is a condition in which bone destruction happens more quickly than new bone creation. After menopause, you may be at an increased risk for osteoporosis. To help prevent osteoporosis or the bone fractures that can happen because of osteoporosis, the following is recommended:  If you are 63-58 years old, get at least 1,000 mg of calcium and at least 600 mg of vitamin D per day.  If you are older than age 57 but younger than age 24, get at least 1,200 mg of calcium and at least 600 mg of vitamin D per day.  If you are older than age 29, get at least 1,200 mg of calcium and at least 800 mg of vitamin D per day. Smoking and excessive alcohol intake increase the risk of osteoporosis. Eat foods that are rich in calcium and vitamin D, and do weight-bearing exercises several times each week as directed by your health care provider. What should I know about how menopause affects my mental health? Depression may occur at any age, but it is more common as you become older. Common symptoms of depression include:  Low or sad mood.  Changes in sleep patterns.  Changes in appetite or eating  patterns.  Feeling an overall lack of motivation or enjoyment of activities that you previously enjoyed.  Frequent crying spells. Talk with your health care provider if you think that you are experiencing depression. What should I know about immunizations? It is important that you get and maintain your immunizations. These include:  Tetanus, diphtheria, and pertussis (Tdap) booster vaccine.  Influenza every year before the flu season begins.  Pneumonia vaccine.  Shingles vaccine. Your health care provider may also recommend other immunizations. This information is not intended to replace advice given to you by your health care provider. Make sure you discuss any questions you have with your health care provider. Document Released: 09/29/2005 Document Revised: 02/25/2016 Document Reviewed: 05/11/2015  2017 Elsevier

## 2016-08-10 ENCOUNTER — Ambulatory Visit (HOSPITAL_COMMUNITY)
Admission: RE | Admit: 2016-08-10 | Discharge: 2016-08-10 | Disposition: A | Discharge status: Home / Self Care | Payer: Commercial Managed Care - HMO | Source: Ambulatory Visit | Attending: Family Medicine | Admitting: Family Medicine

## 2016-08-10 ENCOUNTER — Telehealth: Payer: Self-pay | Admitting: Family Medicine

## 2016-08-10 DIAGNOSIS — N200 Calculus of kidney: Secondary | ICD-10-CM | POA: Diagnosis not present

## 2016-08-10 DIAGNOSIS — R1032 Left lower quadrant pain: Secondary | ICD-10-CM | POA: Diagnosis not present

## 2016-08-10 MED ORDER — IOPAMIDOL (ISOVUE-300) INJECTION 61%
100.0000 mL | Freq: Once | INTRAVENOUS | Status: AC | PRN
  Administered 2016-08-10: 100 mL via INTRAVENOUS

## 2016-08-10 NOTE — Telephone Encounter (Signed)
Per pt she can not take the Metrodiazinole It is causing nausea and dizzines Please review and advise

## 2016-08-11 ENCOUNTER — Other Ambulatory Visit: Payer: Self-pay | Admitting: Family Medicine

## 2016-08-11 MED ORDER — VANCOMYCIN HCL 125 MG PO CAPS: 125.0000 mg | ORAL_CAPSULE | Freq: Four times a day (QID) | ORAL | 0 refills | Status: DC

## 2016-08-11 NOTE — Telephone Encounter (Signed)
Patient aware that medication has been sent to pharmacy and that she will need to start taking it right away.  Patient verbalized understanding.

## 2016-08-11 NOTE — Telephone Encounter (Signed)
Can sustitute vancomycin. Can be very expensive. I heard [facility] can get it cheaper. Se if she wants me to send it there or somewhere else

## 2016-08-11 NOTE — Telephone Encounter (Signed)
Please contact the patient to let her know that it is critical that there be no delay getting this medicine and that she take start taking it right away. I sent the prescription to [facility].

## 2016-08-11 NOTE — Telephone Encounter (Signed)
Patient would like to try and see how much vancomycin will cost. Lets send it to [facility] for now and see how her insurance covers it. She will let us know if it's too expensive. If so then we can send it to an independent pharmacy and see what their cash price is.

## 2016-08-15 DIAGNOSIS — J811 Chronic pulmonary edema: Secondary | ICD-10-CM | POA: Diagnosis not present

## 2016-08-15 DIAGNOSIS — G4733 Obstructive sleep apnea (adult) (pediatric): Secondary | ICD-10-CM | POA: Diagnosis not present

## 2016-08-15 DIAGNOSIS — J45909 Unspecified asthma, uncomplicated: Secondary | ICD-10-CM | POA: Diagnosis not present

## 2016-08-15 DIAGNOSIS — J9611 Chronic respiratory failure with hypoxia: Secondary | ICD-10-CM | POA: Diagnosis not present

## 2016-08-18 ENCOUNTER — Ambulatory Visit (INDEPENDENT_AMBULATORY_CARE_PROVIDER_SITE_OTHER): Payer: Commercial Managed Care - HMO | Admitting: Pharmacist

## 2016-08-18 ENCOUNTER — Telehealth: Payer: Self-pay

## 2016-08-18 DIAGNOSIS — J811 Chronic pulmonary edema: Secondary | ICD-10-CM | POA: Diagnosis not present

## 2016-08-18 DIAGNOSIS — I482 Chronic atrial fibrillation, unspecified: Secondary | ICD-10-CM

## 2016-08-18 DIAGNOSIS — Z86711 Personal history of pulmonary embolism: Secondary | ICD-10-CM

## 2016-08-18 LAB — COAGUCHEK XS/INR WAIVED
INR: 2.1 — ABNORMAL HIGH (ref 0.9–1.1)
Prothrombin Time: 25 s

## 2016-08-18 NOTE — Telephone Encounter (Signed)
Patient states that 2nd med for c-diff that was send to pharmacy is too expensive and she cannot afford. As of now she is taking nothing for c-diff. Can another antibiotic be called in. Please advise and route to [facility] so nurse can call patient today.

## 2016-08-18 NOTE — Patient Instructions (Signed)
Anticoagulation Dose Instructions as of 08/18/2016      Dorene Grebe Tue Wed Thu Fri Sat   New Dose 2.5 mg 2.5 mg 2.5 mg 2.5 mg 2.5 mg 2.5 mg 2.5 mg    Description   Continue same dose of warfarin at 2.5mg  (1/2 tablet) daily. If you get started back on metronidazole then call so we can adjust warfarin again.  INR today 2.1

## 2016-08-18 NOTE — Telephone Encounter (Signed)
Called back, limited options for c diff treatment. Vancomycin too expensive for pt. Metronidazole made her feel nauseous, took it for 4-5 days before stopping, but did not have rash. Thinks she could tolerate it to finish treatment. Avoid alcohol with med.

## 2016-08-24 ENCOUNTER — Telehealth: Payer: Self-pay | Admitting: Family Medicine

## 2016-08-24 NOTE — Telephone Encounter (Signed)
Unable to finish metronidazole due to dizziness, nausea, short of breath feelings and dry mouth.  She has taken four days of medication and unsure if she will try to finish.  She has follow up appointment next week.

## 2016-08-26 DIAGNOSIS — Z86711 Personal history of pulmonary embolism: Secondary | ICD-10-CM | POA: Diagnosis not present

## 2016-08-26 DIAGNOSIS — J449 Chronic obstructive pulmonary disease, unspecified: Secondary | ICD-10-CM | POA: Diagnosis not present

## 2016-08-26 DIAGNOSIS — M7989 Other specified soft tissue disorders: Secondary | ICD-10-CM | POA: Diagnosis not present

## 2016-08-26 DIAGNOSIS — G4730 Sleep apnea, unspecified: Secondary | ICD-10-CM | POA: Diagnosis not present

## 2016-08-26 DIAGNOSIS — Z888 Allergy status to other drugs, medicaments and biological substances status: Secondary | ICD-10-CM | POA: Diagnosis not present

## 2016-08-26 DIAGNOSIS — S9002XA Contusion of left ankle, initial encounter: Secondary | ICD-10-CM | POA: Diagnosis not present

## 2016-08-26 DIAGNOSIS — M25572 Pain in left ankle and joints of left foot: Secondary | ICD-10-CM | POA: Diagnosis not present

## 2016-08-26 DIAGNOSIS — Z883 Allergy status to other anti-infective agents status: Secondary | ICD-10-CM | POA: Diagnosis not present

## 2016-08-29 ENCOUNTER — Encounter: Payer: Self-pay | Admitting: Family Medicine

## 2016-08-29 ENCOUNTER — Ambulatory Visit (INDEPENDENT_AMBULATORY_CARE_PROVIDER_SITE_OTHER): Payer: Medicare HMO | Admitting: Family Medicine

## 2016-08-29 VITALS — BP 107/66 | HR 86 | Temp 97.4°F | Ht 66.0 in | Wt 221.0 lb

## 2016-08-29 DIAGNOSIS — J811 Chronic pulmonary edema: Secondary | ICD-10-CM | POA: Diagnosis not present

## 2016-08-29 DIAGNOSIS — A0472 Enterocolitis due to Clostridium difficile, not specified as recurrent: Secondary | ICD-10-CM

## 2016-08-29 DIAGNOSIS — Z86711 Personal history of pulmonary embolism: Secondary | ICD-10-CM

## 2016-08-29 DIAGNOSIS — I482 Chronic atrial fibrillation, unspecified: Secondary | ICD-10-CM

## 2016-08-29 DIAGNOSIS — M25579 Pain in unspecified ankle and joints of unspecified foot: Secondary | ICD-10-CM

## 2016-08-29 DIAGNOSIS — M25572 Pain in left ankle and joints of left foot: Secondary | ICD-10-CM | POA: Diagnosis not present

## 2016-08-29 LAB — COAGUCHEK XS/INR WAIVED
INR: 1.6 — ABNORMAL HIGH (ref 0.9–1.1)
Prothrombin Time: 19.3 s

## 2016-08-29 NOTE — Progress Notes (Signed)
Subjective:  Patient ID: Kimberly Obrien, female    DOB: [birth year]  Age: 56 y.o. MRN: [id number]  CC: c diff (pt here today following up after positive for c diff, she didn't finish the flagyl as it made her really sick. She also fell this past Saturday and went to ED in [place] and xray was negative for fracture per pt. She also needs protime today)   HPI Kimberly Obrien presents for For the most part symptoms from the abdomen have cleared. She took the Flagyl for about 5 days. After that the nausea was so severe she could not tolerate it any further. She was given a prescription for vancomycin but that was not filled due to cost of $320 for the course.   Patient is also having significant pain in the ankle in spite of x-ray showing no fracture. Able to bear weight, but pain is severe. Mild with elevation & ice.   History Kimberly Obrien has a past medical history of Anticoagulation monitoring by pharmacist; Arthritis; Asthma; Autoimmune hepatitis ([place]); Cataract; COPD (chronic obstructive pulmonary disease) ([place]); Hyperlipidemia; and PE (pulmonary embolism).   She has a past surgical history that includes Tubal ligation; Wrist surgery (Right); Cholecystectomy; Spine surgery; Abdominal hysterectomy; Eye surgery; left and right heart catheterization with coronary angiogram (N/A, 05/13/2014); Incontinence surgery (2009); and Cataract extraction, bilateral (Bilateral).   Her family history includes Cirrhosis in her mother; Heart failure in her father; Hyperlipidemia in her father; Hypertension in her brother; Obesity in her daughter.She reports that she quit smoking about 4 years ago. Her smoking use included Cigarettes. She started smoking about 39 years ago. She has a 100.00 pack-year smoking history. She has never used smokeless tobacco. She reports that she does not drink alcohol or use drugs.    ROS Review of Systems  HENT: Positive for congestion, rhinorrhea and sore throat.     Respiratory: Negative for shortness of breath.   Cardiovascular: Negative for chest pain.  Gastrointestinal: Positive for abdominal pain (mild, intermittent). Negative for blood in stool, diarrhea, nausea, rectal pain and vomiting.  Genitourinary: Negative for dysuria.  Musculoskeletal: Positive for arthralgias.  Psychiatric/Behavioral: Negative for agitation and dysphoric mood. The patient is not nervous/anxious.     Objective:  BP 107/66   Pulse 86   Temp 97.4 F (36.3 C) (Oral)   Ht 5\' 6"  (1.676 m)   Wt 221 lb (100.2 kg)   BMI 35.67 kg/m   BP Readings from Last 3 Encounters:  08/29/16 107/66  08/09/16 122/70  08/01/16 114/67    Wt Readings from Last 3 Encounters:  08/29/16 221 lb (100.2 kg)  08/09/16 217 lb 8 oz (98.7 kg)  08/01/16 219 lb (99.3 kg)     Physical Exam  Constitutional: She is oriented to person, place, and time. She appears well-developed and well-nourished. No distress.  HENT:  Head: Normocephalic and atraumatic.  Right Ear: External ear normal.  Left Ear: External ear normal.  Nose: Nose normal.  Mouth/Throat: Oropharynx is clear and moist.  Eyes: Conjunctivae and EOM are normal. Pupils are equal, round, and reactive to light.  Neck: Normal range of motion. Neck supple. No thyromegaly present.  Cardiovascular: Normal rate, regular rhythm and normal heart sounds.   No murmur heard. Pulmonary/Chest: Effort normal and breath sounds normal. No respiratory distress. She has no wheezes. She has no rales.  Abdominal: Soft. Bowel sounds are normal. She exhibits no distension. There is no tenderness.  Musculoskeletal: She exhibits edema (at ankle, grade 2  opening with soft endpoint for inversion).  Lymphadenopathy:    She has no cervical adenopathy.  Neurological: She is alert and oriented to person, place, and time. She has normal reflexes.  Skin: Skin is warm and dry.  Psychiatric: She has a normal mood and affect. Her behavior is normal. Judgment and  thought content normal.     Lab Results  Component Value Date   WBC 13.0 (H) 08/01/2016   HGB 15.4 12/30/2015   HCT 44.6 08/01/2016   PLT 181 08/01/2016   GLUCOSE 118 (H) 08/01/2016   CHOL 192 09/23/2015   TRIG 165 (H) 09/23/2015   HDL 51 09/23/2015   LDLCALC 108 (H) 09/23/2015   ALT 92 (H) 08/01/2016   AST 64 (H) 08/01/2016   NA 142 08/01/2016   K 3.6 08/01/2016   CL 100 08/01/2016   CREATININE 0.86 08/01/2016   BUN 13 08/01/2016   CO2 26 08/01/2016   TSH 0.441 (L) 09/23/2015   INR 1.6 (H) 08/29/2016   HGBA1C 5.6 09/28/2015    Ct Abdomen Pelvis W Contrast  Result Date: 08/10/2016 CLINICAL DATA:  Abdominal pain for 1 month EXAM: CT ABDOMEN AND PELVIS WITH CONTRAST TECHNIQUE: Multidetector CT imaging of the abdomen and pelvis was performed using the standard protocol following bolus administration of intravenous contrast. CONTRAST:  172mL ISOVUE-300 IOPAMIDOL (ISOVUE-300) INJECTION 61% COMPARISON:  12/06/2015 FINDINGS: Lower chest: Emphysematous changes are again seen and stable. Hepatobiliary: Fatty liver is noted. The gallbladder has been surgically removed. Pancreas: Unremarkable. No pancreatic ductal dilatation or surrounding inflammatory changes. Spleen: Normal in size without focal abnormality. Adrenals/Urinary Tract: Small nonobstructing left renal stone is again seen. The adrenal glands are stable. The kidneys are otherwise within normal limits. No obstructive changes are seen. The bladder is partially distended. Stomach/Bowel: Stomach is within normal limits. Appendix appears normal. No evidence of bowel wall thickening, distention, or inflammatory changes. Scattered diverticular changes noted without acute abnormality. Vascular/Lymphatic: Aortic atherosclerosis. No enlarged abdominal or pelvic lymph nodes. Reproductive: Status post hysterectomy. No adnexal masses. Other: No abdominal wall hernia or abnormality. No abdominopelvic ascites. Musculoskeletal: No acute or  significant osseous findings. Postsurgical changes are again noted. IMPRESSION: Nonobstructing left renal stone is seen. No other acute abnormality is noted. Electronically Signed   By: Inez Catalina M.D.   On: 08/10/2016 13:36    Assessment & Plan:   Kimberly Obrien was seen today for c diff.  Diagnoses and all orders for this visit:  Pain in joint involving ankle and foot, unspecified laterality  Chronic pulmonary edema -     CoaguChek XS/INR Waived  Enteritis due to Clostridium difficile    Cast boot dispensed to patient. INR per warfarin clinic  We discussed the need to treat the c. Dif. Pt will notify me if sx return. May require hospitalization.  I have discontinued Kimberly Obrien's metroNIDAZOLE and vancomycin. I am also having her maintain her albuterol, fluticasone, Vitamin D (Ergocalciferol), warfarin, tiotropium, and traMADol.  No orders of the defined types were placed in this encounter.    Follow-up: Return in about 2 weeks (around 09/12/2016).  Claretta Fraise, M.D.

## 2016-08-30 ENCOUNTER — Telehealth: Payer: Self-pay | Admitting: Pharmacist

## 2016-08-30 ENCOUNTER — Other Ambulatory Visit: Payer: Self-pay | Admitting: Family Medicine

## 2016-08-30 MED ORDER — VANCOMYCIN HCL 125 MG PO CAPS: 125.0000 mg | ORAL_CAPSULE | Freq: Four times a day (QID) | ORAL | 0 refills | Status: DC

## 2016-08-30 NOTE — Telephone Encounter (Signed)
I sent in the requested prescription 

## 2016-08-30 NOTE — Telephone Encounter (Signed)
Patient aware.

## 2016-08-31 ENCOUNTER — Telehealth: Payer: Self-pay | Admitting: Family Medicine

## 2016-08-31 DIAGNOSIS — M25572 Pain in left ankle and joints of left foot: Secondary | ICD-10-CM

## 2016-09-01 ENCOUNTER — Other Ambulatory Visit: Payer: Self-pay | Admitting: Family Medicine

## 2016-09-01 NOTE — Telephone Encounter (Signed)
Referral to Ortho entered in [facility] per Dr Livia Snellen

## 2016-09-01 NOTE — Telephone Encounter (Signed)
Refer as requested. Thanks, WS

## 2016-09-03 DIAGNOSIS — J811 Chronic pulmonary edema: Secondary | ICD-10-CM | POA: Diagnosis not present

## 2016-09-03 DIAGNOSIS — J45909 Unspecified asthma, uncomplicated: Secondary | ICD-10-CM | POA: Diagnosis not present

## 2016-09-08 ENCOUNTER — Encounter: Payer: Self-pay | Admitting: Family Medicine

## 2016-09-08 ENCOUNTER — Ambulatory Visit (INDEPENDENT_AMBULATORY_CARE_PROVIDER_SITE_OTHER): Payer: Medicare HMO | Admitting: Family Medicine

## 2016-09-08 VITALS — BP 116/64 | HR 96 | Temp 98.1°F | Ht 66.0 in | Wt 220.0 lb

## 2016-09-08 DIAGNOSIS — A0472 Enterocolitis due to Clostridium difficile, not specified as recurrent: Secondary | ICD-10-CM

## 2016-09-08 DIAGNOSIS — M25572 Pain in left ankle and joints of left foot: Secondary | ICD-10-CM | POA: Diagnosis not present

## 2016-09-08 DIAGNOSIS — J029 Acute pharyngitis, unspecified: Secondary | ICD-10-CM

## 2016-09-08 DIAGNOSIS — R1032 Left lower quadrant pain: Secondary | ICD-10-CM | POA: Diagnosis not present

## 2016-09-08 MED ORDER — BETAMETHASONE SOD PHOS & ACET 6 (3-3) MG/ML IJ SUSP: 6.0000 mg | Freq: Once | INTRAMUSCULAR | Status: DC

## 2016-09-08 NOTE — Progress Notes (Signed)
Subjective:  Patient ID: Kimberly Obrien, female    DOB: 04-05-1961  Age: 56 y.o. MRN: [id number]  CC: Sore Throat (pt here today c/o sore throat, hurts to swallow anything)   HPI Kimberly Obrien presents for concern that vanc for her c. Dif infection is causing LLQ pain. It seems her diarrhea gets worse if she takes the vanc. She only took it for three days due to expense. She would like a shot of antibiotic for the sore throat. She also has a great deal of pain at the left ankle. She could not tolerate the cast boot. It caused her back pain to worsen. She is staying off of the foot as much as possible.   History Kimberly Obrien has a past medical history of Anticoagulation monitoring by pharmacist; Arthritis; Asthma; Autoimmune hepatitis ([place]); Cataract; COPD (chronic obstructive pulmonary disease) ([place]); Hyperlipidemia; and PE (pulmonary embolism).   She has a past surgical history that includes Tubal ligation; Wrist surgery (Right); Cholecystectomy; Spine surgery; Abdominal hysterectomy; Eye surgery; left and right heart catheterization with coronary angiogram (N/A, 05/13/2014); Incontinence surgery (2009); and Cataract extraction, bilateral (Bilateral).   Her family history includes Cirrhosis in her mother; Heart failure in her father; Hyperlipidemia in her father; Hypertension in her brother; Obesity in her daughter.She reports that she quit smoking about 4 years ago. Her smoking use included Cigarettes. She started smoking about 39 years ago. She has a 100.00 pack-year smoking history. She has never used smokeless tobacco. She reports that she does not drink alcohol or use drugs.  Current Outpatient Prescriptions on File Prior to Visit  Medication Sig Dispense Refill  . albuterol (PROVENTIL HFA;VENTOLIN HFA) 108 (90 BASE) MCG/ACT inhaler Inhale into the lungs every 6 (six) hours as needed for wheezing or shortness of breath.    . fluticasone (FLONASE) 50 MCG/ACT nasal spray Place 2 sprays  into both nostrils daily. 16 g 6  . tiotropium (SPIRIVA) 18 MCG inhalation capsule Place 18 mcg into inhaler and inhale daily.    . traMADol (ULTRAM) 50 MG tablet Take 1 tablet (50 mg total) by mouth 4 (four) times daily as needed for moderate pain. 60 tablet 02  . Vitamin D, Ergocalciferol, (DRISDOL) 50000 units CAPS capsule Take 1 capsule (50,000 Units total) by mouth every 7 (seven) days. 12 capsule 3  . warfarin (COUMADIN) 5 MG tablet TAKE ONE-HALF TO ONE TABLET BY MOUTH ONCE DAILY 50 tablet 0   No current facility-administered medications on file prior to visit.     ROS Review of Systems  Constitutional: Negative for activity change, appetite change and fever.  HENT: Positive for congestion and sore throat. Negative for rhinorrhea.   Eyes: Negative for visual disturbance.  Respiratory: Negative for cough and shortness of breath.   Cardiovascular: Negative for chest pain and palpitations.  Gastrointestinal: Positive for diarrhea. Negative for abdominal pain and nausea.  Genitourinary: Negative for dysuria.  Musculoskeletal: Positive for arthralgias, back pain and joint swelling. Negative for myalgias.    Objective:  BP 116/64   Pulse 96   Temp 98.1 F (36.7 C) (Oral)   Ht 5\' 6"  (1.676 m)   Wt 220 lb (99.8 kg)   BMI 35.51 kg/m   Physical Exam  Constitutional: She is oriented to person, place, and time. She appears well-developed and well-nourished. No distress.  HENT:  Head: Normocephalic and atraumatic.  Right Ear: External ear normal.  Left Ear: External ear normal.  Nose: Nose normal.  Mouth/Throat: Oropharynx is clear and moist.  Eyes: Conjunctivae and EOM are normal. Pupils are equal, round, and reactive to light.  Neck: Normal range of motion. Neck supple. No thyromegaly present.  Cardiovascular: Normal rate, regular rhythm and normal heart sounds.   No murmur heard. Pulmonary/Chest: Effort normal and breath sounds normal. No respiratory distress. She has no  wheezes. She has no rales.  Abdominal: Soft. Bowel sounds are normal. She exhibits no distension. There is no tenderness (left inguinal ).  Lymphadenopathy:    She has no cervical adenopathy.  Neurological: She is alert and oriented to person, place, and time. She has normal reflexes.  Skin: Skin is warm and dry.  Psychiatric: Thought content normal. Her mood appears anxious. She is agitated. She expresses impulsivity.    Assessment & Plan:   Kimberly Obrien was seen today for sore throat.  Diagnoses and all orders for this visit:  Abdominal pain, LLQ  Acute left ankle pain  Enteritis due to Clostridium difficile -     betamethasone acetate-betamethasone sodium phosphate (CELESTONE) injection 6 mg; Inject 1 mL (6 mg total) into the muscle once.  Acute pharyngitis, unspecified etiology   I have discontinued Kimberly Obrien's vancomycin. I am also having her maintain her albuterol, fluticasone, Vitamin D (Ergocalciferol), tiotropium, traMADol, and warfarin. We will continue to administer betamethasone acetate-betamethasone sodium phosphate.  Meds ordered this encounter  Medications  . betamethasone acetate-betamethasone sodium phosphate (CELESTONE) injection 6 mg     Follow-up: Return if symptoms worsen or fail to improve.  Claretta Fraise, M.D.

## 2016-09-12 ENCOUNTER — Encounter: Payer: Self-pay | Admitting: Pharmacist

## 2016-09-13 ENCOUNTER — Encounter: Payer: Self-pay | Admitting: Family Medicine

## 2016-09-15 DIAGNOSIS — J9611 Chronic respiratory failure with hypoxia: Secondary | ICD-10-CM | POA: Diagnosis not present

## 2016-09-15 DIAGNOSIS — J45909 Unspecified asthma, uncomplicated: Secondary | ICD-10-CM | POA: Diagnosis not present

## 2016-09-15 DIAGNOSIS — J811 Chronic pulmonary edema: Secondary | ICD-10-CM | POA: Diagnosis not present

## 2016-09-15 DIAGNOSIS — G4733 Obstructive sleep apnea (adult) (pediatric): Secondary | ICD-10-CM | POA: Diagnosis not present

## 2016-10-02 DIAGNOSIS — M25572 Pain in left ankle and joints of left foot: Secondary | ICD-10-CM | POA: Diagnosis not present

## 2016-10-02 DIAGNOSIS — R748 Abnormal levels of other serum enzymes: Secondary | ICD-10-CM | POA: Diagnosis not present

## 2016-10-02 DIAGNOSIS — S82832A Other fracture of upper and lower end of left fibula, initial encounter for closed fracture: Secondary | ICD-10-CM | POA: Diagnosis not present

## 2016-10-04 DIAGNOSIS — J811 Chronic pulmonary edema: Secondary | ICD-10-CM | POA: Diagnosis not present

## 2016-10-04 DIAGNOSIS — J45909 Unspecified asthma, uncomplicated: Secondary | ICD-10-CM | POA: Diagnosis not present

## 2016-10-05 ENCOUNTER — Ambulatory Visit (INDEPENDENT_AMBULATORY_CARE_PROVIDER_SITE_OTHER): Payer: Medicare HMO | Admitting: Pharmacist

## 2016-10-05 DIAGNOSIS — J811 Chronic pulmonary edema: Secondary | ICD-10-CM

## 2016-10-05 DIAGNOSIS — I482 Chronic atrial fibrillation, unspecified: Secondary | ICD-10-CM

## 2016-10-05 DIAGNOSIS — Z86711 Personal history of pulmonary embolism: Secondary | ICD-10-CM

## 2016-10-05 LAB — COAGUCHEK XS/INR WAIVED
INR: 3.4 — ABNORMAL HIGH (ref 0.9–1.1)
Prothrombin Time: 41.3 s

## 2016-10-06 LAB — COAGUCHEK XS/INR WAIVED
INR: 3.4 — ABNORMAL HIGH (ref 0.9–1.1)
Prothrombin Time: 41.3 s

## 2016-10-12 ENCOUNTER — Ambulatory Visit (INDEPENDENT_AMBULATORY_CARE_PROVIDER_SITE_OTHER): Payer: Medicare HMO | Admitting: Family Medicine

## 2016-10-12 ENCOUNTER — Encounter: Payer: Self-pay | Admitting: Family Medicine

## 2016-10-12 VITALS — BP 111/72 | HR 89 | Temp 97.6°F | Ht 66.0 in | Wt 220.0 lb

## 2016-10-12 DIAGNOSIS — L0292 Furuncle, unspecified: Secondary | ICD-10-CM

## 2016-10-12 DIAGNOSIS — N3090 Cystitis, unspecified without hematuria: Secondary | ICD-10-CM

## 2016-10-12 DIAGNOSIS — A0472 Enterocolitis due to Clostridium difficile, not specified as recurrent: Secondary | ICD-10-CM

## 2016-10-12 LAB — URINALYSIS, COMPLETE
Bilirubin, UA: NEGATIVE
Glucose, UA: NEGATIVE
Ketones, UA: NEGATIVE
Nitrite, UA: NEGATIVE
Protein, UA: NEGATIVE
Specific Gravity, UA: 1.02 (ref 1.005–1.030)
Urobilinogen, Ur: 1 mg/dL (ref 0.2–1.0)
pH, UA: 5.5 (ref 5.0–7.5)

## 2016-10-12 LAB — MICROSCOPIC EXAMINATION
Epithelial Cells (non renal): >10 /hpf — AB (ref 0–10)
Renal Epithel, UA: NONE SEEN /hpf
WBC, UA: >30 /hpf — AB (ref 0–?)

## 2016-10-12 MED ORDER — NITROFURANTOIN MONOHYD MACRO 100 MG PO CAPS: 100.0000 mg | ORAL_CAPSULE | Freq: Two times a day (BID) | ORAL | 0 refills | Status: DC

## 2016-10-12 NOTE — Progress Notes (Signed)
Subjective:  Patient ID: Kimberly Obrien, female    DOB: 05/03/1961  Age: 56 y.o. MRN: [id number]  CC: Urinary Tract Infection (pt here today c/o urinary urgency and dysuria. She also has questions regarding her recent diagnosis of C diff.)   HPI Kimberly Obrien presents for burning with urination and frequency for several days. Denies fever . No flank pain. No nausea, vomiting. No longer having diarrhea. She has some residual left lower quadrant pain right above the inguinal ligament. Mild cramping sensation. She wants to be cleared from her C. difficile. She'd like to repeat her stool specimen she states that she took the entire course of vancomycin.  She also has some lesions on the arms that have been going on for years off and on. The become red and swollen then she gets a core out of them and then they leave a sore spot. She has multiple white depigmented regions on her arms that are 2-3 mm each & too numerous to count.    History Marquesha has a past medical history of Anticoagulation monitoring by pharmacist; Arthritis; Asthma; Autoimmune hepatitis ([place]); Cataract; COPD (chronic obstructive pulmonary disease) ([place]); Hyperlipidemia; and PE (pulmonary embolism).   She has a past surgical history that includes Tubal ligation; Wrist surgery (Right); Cholecystectomy; Spine surgery; Abdominal hysterectomy; Eye surgery; left and right heart catheterization with coronary angiogram (N/A, 05/13/2014); Incontinence surgery (2009); and Cataract extraction, bilateral (Bilateral).   Her family history includes Cirrhosis in her mother; Heart failure in her father; Hyperlipidemia in her father; Hypertension in her brother; Obesity in her daughter.She reports that she quit smoking about 4 years ago. Her smoking use included Cigarettes. She started smoking about 39 years ago. She has a 100.00 pack-year smoking history. She has never used smokeless tobacco. She reports that she does not drink alcohol  or use drugs.    ROS Review of Systems  Constitutional: Negative for chills, diaphoresis and fever.  HENT: Negative for congestion, rhinorrhea and sore throat.   Eyes: Negative for visual disturbance.  Respiratory: Negative for cough and shortness of breath.   Cardiovascular: Negative for chest pain and palpitations.  Gastrointestinal: Negative for abdominal pain, constipation, diarrhea and nausea.  Genitourinary: Positive for dysuria, frequency and urgency. Negative for decreased urine volume, flank pain, hematuria, menstrual problem and pelvic pain.  Musculoskeletal: Negative for arthralgias, joint swelling and myalgias.  Skin: Positive for rash.  Neurological: Negative for dizziness and numbness.    Objective:  BP 111/72   Pulse 89   Temp 97.6 F (36.4 C) (Oral)   Ht 5\' 6"  (1.676 m)   Wt 220 lb (99.8 kg)   BMI 35.51 kg/m   BP Readings from Last 3 Encounters:  10/12/16 111/72  09/08/16 116/64  08/29/16 107/66    Wt Readings from Last 3 Encounters:  10/12/16 220 lb (99.8 kg)  09/08/16 220 lb (99.8 kg)  08/29/16 221 lb (100.2 kg)     Physical Exam  Constitutional: She is oriented to person, place, and time. She appears well-developed and well-nourished. No distress.  HENT:  Head: Normocephalic and atraumatic.  Eyes: Conjunctivae are normal. Pupils are equal, round, and reactive to light.  Neck: Normal range of motion. Neck supple. No thyromegaly present.  Cardiovascular: Normal rate, regular rhythm and normal heart sounds.   No murmur heard. Pulmonary/Chest: Effort normal and breath sounds normal. No respiratory distress. She has no wheezes. She has no rales.  Abdominal: Soft. Bowel sounds are normal. She exhibits no distension and  no mass. There is no tenderness. There is no rebound and no guarding.  Musculoskeletal: Normal range of motion. She exhibits no tenderness.  Lymphadenopathy:    She has no cervical adenopathy.  Neurological: She is alert and oriented  to person, place, and time.  Skin: Skin is warm and dry.  Multiple lesions 6-8 mm each covering much of the upper and lower arms. Each of these is erythematous. There is a central head on several of them. Some are excoriated. Additionally there are multiple healed lesions represented by 2-3 mm white depigmented lesions.  Psychiatric: She has a normal mood and affect. Her behavior is normal. Judgment and thought content normal.      Assessment & Plan:   Sanika was seen today for urinary tract infection.  Diagnoses and all orders for this visit:  Cystitis -     Urine culture -     Urinalysis, Complete  Enteritis due to Clostridium difficile -     C difficile Toxins A+B W/Rflx  Furunculosis of multiple sites -     Ambulatory referral to Dermatology  Other orders -     nitrofurantoin, macrocrystal-monohydrate, (MACROBID) 100 MG capsule; Take 1 capsule (100 mg total) by mouth 2 (two) times daily.    I am having Ms. Lovern start on nitrofurantoin (macrocrystal-monohydrate). I am also having her maintain her albuterol, fluticasone, Vitamin D (Ergocalciferol), tiotropium, traMADol, and warfarin. We will continue to administer betamethasone acetate-betamethasone sodium phosphate.  Allergies as of 10/12/2016      Reactions   Aciphex [rabeprazole Sodium] Rash   Diflucan [fluconazole] Rash   Lansoprazole Hives   Metronidazole Other (See Comments)   Dizziness, nausea, dry mouth and some shortness of breath   Vancomycin Diarrhea   Claritin [loratadine] Other (See Comments)   Tired   Crestor [rosuvastatin] Other (See Comments)   Elevated blood glucose and abdominal pain   Doxycycline Rash   Ibuprofen Other (See Comments)   Upset stomach   Lipitor [atorvastatin] Other (See Comments)   Myalgias, leg pain.  Problems with pancreas, sugars went up also.   Macrobid [nitrofurantoin Macrocrystal] Nausea And Vomiting   Metoprolol Nausea And Vomiting   Nsaids Other (See Comments)   Upset  stomach   Omeprazole    bloating   Rabeprazole Rash   Septra [sulfamethoxazole-trimethoprim] Nausea And Vomiting      Medication List       Accurate as of 10/12/16 12:07 PM. Always use your most recent med list.          albuterol 108 (90 Base) MCG/ACT inhaler Commonly known as:  PROVENTIL HFA;VENTOLIN HFA Inhale into the lungs every 6 (six) hours as needed for wheezing or shortness of breath.   fluticasone 50 MCG/ACT nasal spray Commonly known as:  FLONASE Place 2 sprays into both nostrils daily.   nitrofurantoin (macrocrystal-monohydrate) 100 MG capsule Commonly known as:  MACROBID Take 1 capsule (100 mg total) by mouth 2 (two) times daily.   tiotropium 18 MCG inhalation capsule Commonly known as:  SPIRIVA Place 18 mcg into inhaler and inhale daily.   traMADol 50 MG tablet Commonly known as:  ULTRAM Take 1 tablet (50 mg total) by mouth 4 (four) times daily as needed for moderate pain.   Vitamin D (Ergocalciferol) 50000 units Caps capsule Commonly known as:  DRISDOL Take 1 capsule (50,000 Units total) by mouth every 7 (seven) days.   warfarin 5 MG tablet Commonly known as:  COUMADIN TAKE ONE-HALF TO ONE TABLET BY MOUTH ONCE DAILY  Follow-up: Return in about 1 month (around 11/09/2016).  Claretta Fraise, M.D.

## 2016-10-13 LAB — URINE CULTURE

## 2016-10-17 ENCOUNTER — Other Ambulatory Visit: Payer: Medicare HMO

## 2016-10-17 ENCOUNTER — Other Ambulatory Visit: Payer: Self-pay | Admitting: Family Medicine

## 2016-10-17 DIAGNOSIS — K529 Noninfective gastroenteritis and colitis, unspecified: Secondary | ICD-10-CM

## 2016-10-18 DIAGNOSIS — L281 Prurigo nodularis: Secondary | ICD-10-CM | POA: Diagnosis not present

## 2016-10-18 DIAGNOSIS — L820 Inflamed seborrheic keratosis: Secondary | ICD-10-CM | POA: Diagnosis not present

## 2016-10-18 DIAGNOSIS — L280 Lichen simplex chronicus: Secondary | ICD-10-CM | POA: Diagnosis not present

## 2016-10-22 LAB — GIARDIA/CRYPTOSPORIDIUM EIA
Cryptosporidium EIA: NEGATIVE
Giardia Ag, Stl: NEGATIVE

## 2016-10-22 LAB — CDIFF NAA+O+P+STOOL CULTURE
E coli, Shiga toxin Assay: NEGATIVE
Toxigenic C. Difficile by PCR: NEGATIVE

## 2016-11-01 DIAGNOSIS — J45909 Unspecified asthma, uncomplicated: Secondary | ICD-10-CM | POA: Diagnosis not present

## 2016-11-01 DIAGNOSIS — J811 Chronic pulmonary edema: Secondary | ICD-10-CM | POA: Diagnosis not present

## 2016-11-02 ENCOUNTER — Ambulatory Visit (INDEPENDENT_AMBULATORY_CARE_PROVIDER_SITE_OTHER): Payer: Medicare HMO | Admitting: Pharmacist

## 2016-11-02 DIAGNOSIS — I482 Chronic atrial fibrillation, unspecified: Secondary | ICD-10-CM

## 2016-11-02 DIAGNOSIS — Z86711 Personal history of pulmonary embolism: Secondary | ICD-10-CM

## 2016-11-02 DIAGNOSIS — J811 Chronic pulmonary edema: Secondary | ICD-10-CM | POA: Diagnosis not present

## 2016-11-02 LAB — COAGUCHEK XS/INR WAIVED
INR: 2.1 — ABNORMAL HIGH (ref 0.9–1.1)
Prothrombin Time: 25.7 s

## 2016-11-20 DIAGNOSIS — S82832D Other fracture of upper and lower end of left fibula, subsequent encounter for closed fracture with routine healing: Secondary | ICD-10-CM | POA: Diagnosis not present

## 2016-11-20 DIAGNOSIS — M542 Cervicalgia: Secondary | ICD-10-CM | POA: Diagnosis not present

## 2016-11-20 DIAGNOSIS — M25572 Pain in left ankle and joints of left foot: Secondary | ICD-10-CM | POA: Diagnosis not present

## 2016-11-20 DIAGNOSIS — G8929 Other chronic pain: Secondary | ICD-10-CM | POA: Diagnosis not present

## 2016-11-23 DIAGNOSIS — M5442 Lumbago with sciatica, left side: Secondary | ICD-10-CM | POA: Diagnosis not present

## 2016-11-23 DIAGNOSIS — G8929 Other chronic pain: Secondary | ICD-10-CM | POA: Diagnosis not present

## 2016-11-23 DIAGNOSIS — M50323 Other cervical disc degeneration at C6-C7 level: Secondary | ICD-10-CM | POA: Diagnosis not present

## 2016-11-23 DIAGNOSIS — M542 Cervicalgia: Secondary | ICD-10-CM | POA: Diagnosis not present

## 2016-11-30 DIAGNOSIS — I4891 Unspecified atrial fibrillation: Secondary | ICD-10-CM | POA: Diagnosis not present

## 2016-11-30 DIAGNOSIS — J45909 Unspecified asthma, uncomplicated: Secondary | ICD-10-CM | POA: Diagnosis not present

## 2016-11-30 DIAGNOSIS — J9611 Chronic respiratory failure with hypoxia: Secondary | ICD-10-CM | POA: Diagnosis not present

## 2016-11-30 DIAGNOSIS — G4733 Obstructive sleep apnea (adult) (pediatric): Secondary | ICD-10-CM | POA: Diagnosis not present

## 2016-12-01 ENCOUNTER — Other Ambulatory Visit: Payer: Self-pay | Admitting: Family Medicine

## 2016-12-02 DIAGNOSIS — J45909 Unspecified asthma, uncomplicated: Secondary | ICD-10-CM | POA: Diagnosis not present

## 2016-12-02 DIAGNOSIS — J811 Chronic pulmonary edema: Secondary | ICD-10-CM | POA: Diagnosis not present

## 2016-12-05 ENCOUNTER — Other Ambulatory Visit: Payer: Self-pay | Admitting: Family

## 2016-12-06 DIAGNOSIS — M50323 Other cervical disc degeneration at C6-C7 level: Secondary | ICD-10-CM | POA: Diagnosis not present

## 2016-12-06 DIAGNOSIS — M5442 Lumbago with sciatica, left side: Secondary | ICD-10-CM | POA: Diagnosis not present

## 2016-12-15 ENCOUNTER — Other Ambulatory Visit: Payer: Self-pay | Admitting: Family

## 2016-12-15 DIAGNOSIS — M50323 Other cervical disc degeneration at C6-C7 level: Secondary | ICD-10-CM | POA: Diagnosis not present

## 2016-12-15 DIAGNOSIS — M5442 Lumbago with sciatica, left side: Secondary | ICD-10-CM | POA: Diagnosis not present

## 2016-12-15 DIAGNOSIS — M542 Cervicalgia: Secondary | ICD-10-CM | POA: Diagnosis not present

## 2016-12-15 DIAGNOSIS — G8929 Other chronic pain: Secondary | ICD-10-CM | POA: Diagnosis not present

## 2016-12-21 ENCOUNTER — Ambulatory Visit (INDEPENDENT_AMBULATORY_CARE_PROVIDER_SITE_OTHER): Payer: Medicare HMO | Admitting: Family

## 2016-12-21 ENCOUNTER — Encounter: Payer: Self-pay | Admitting: Family

## 2016-12-21 VITALS — BP 111/66 | HR 91 | Temp 97.3°F | Ht 66.0 in | Wt 217.2 lb

## 2016-12-21 DIAGNOSIS — Z1211 Encounter for screening for malignant neoplasm of colon: Secondary | ICD-10-CM

## 2016-12-21 DIAGNOSIS — E785 Hyperlipidemia, unspecified: Secondary | ICD-10-CM | POA: Diagnosis not present

## 2016-12-21 DIAGNOSIS — F411 Generalized anxiety disorder: Secondary | ICD-10-CM

## 2016-12-21 DIAGNOSIS — J811 Chronic pulmonary edema: Secondary | ICD-10-CM | POA: Diagnosis not present

## 2016-12-21 DIAGNOSIS — Z86711 Personal history of pulmonary embolism: Secondary | ICD-10-CM | POA: Diagnosis not present

## 2016-12-21 DIAGNOSIS — I482 Chronic atrial fibrillation, unspecified: Secondary | ICD-10-CM

## 2016-12-21 DIAGNOSIS — Z9189 Other specified personal risk factors, not elsewhere classified: Secondary | ICD-10-CM | POA: Diagnosis not present

## 2016-12-21 DIAGNOSIS — R5383 Other fatigue: Secondary | ICD-10-CM | POA: Diagnosis not present

## 2016-12-21 DIAGNOSIS — J449 Chronic obstructive pulmonary disease, unspecified: Secondary | ICD-10-CM | POA: Diagnosis not present

## 2016-12-21 DIAGNOSIS — Z01419 Encounter for gynecological examination (general) (routine) without abnormal findings: Secondary | ICD-10-CM

## 2016-12-21 LAB — COAGUCHEK XS/INR WAIVED
INR: 2.7 — ABNORMAL HIGH (ref 0.9–1.1)
Prothrombin Time: 31.8 s

## 2016-12-21 NOTE — Addendum Note (Signed)
Addended by: Liliane Bade on: 12/21/2016 12:44 PM   Modules accepted: Orders

## 2016-12-21 NOTE — Patient Instructions (Signed)
Health Maintenance, Female Adopting a healthy lifestyle and getting preventive care can go a long way to promote health and wellness. Talk with your health care provider about what schedule of regular examinations is right for you. This is a good chance for you to check in with your provider about disease prevention and staying healthy. In between checkups, there are plenty of things you can do on your own. Experts have done a lot of research about which lifestyle changes and preventive measures are most likely to keep you healthy. Ask your health care provider for more information. Weight and diet Eat a healthy diet  Be sure to include plenty of vegetables, fruits, low-fat dairy products, and lean protein.  Do not eat a lot of foods high in solid fats, added sugars, or salt.  Get regular exercise. This is one of the most important things you can do for your health.  Most adults should exercise for at least 150 minutes each week. The exercise should increase your heart rate and make you sweat (moderate-intensity exercise).  Most adults should also do strengthening exercises at least twice a week. This is in addition to the moderate-intensity exercise. Maintain a healthy weight  Body mass index (BMI) is a measurement that can be used to identify possible weight problems. It estimates body fat based on height and weight. Your health care provider can help determine your BMI and help you achieve or maintain a healthy weight.  For females 76 years of age and older:  A BMI below 18.5 is considered underweight.  A BMI of 18.5 to 24.9 is normal.  A BMI of 25 to 29.9 is considered overweight.  A BMI of 30 and above is considered obese. Watch levels of cholesterol and blood lipids  You should start having your blood tested for lipids and cholesterol at 56 years of age, then have this test every 5 years.  You may need to have your cholesterol levels checked more often if:  Your lipid or  cholesterol levels are high.  You are older than 56 years of age.  You are at high risk for heart disease. Cancer screening Lung Cancer  Lung cancer screening is recommended for adults 64-42 years old who are at high risk for lung cancer because of a history of smoking.  A yearly low-dose CT scan of the lungs is recommended for people who:  Currently smoke.  Have quit within the past 15 years.  Have at least a 30-pack-year history of smoking. A pack year is smoking an average of one pack of cigarettes a day for 1 year.  Yearly screening should continue until it has been 15 years since you quit.  Yearly screening should stop if you develop a health problem that would prevent you from having lung cancer treatment. Breast Cancer  Practice breast self-awareness. This means understanding how your breasts normally appear and feel.  It also means doing regular breast self-exams. Let your health care provider know about any changes, no matter how small.  If you are in your 20s or 30s, you should have a clinical breast exam (CBE) by a health care provider every 1-3 years as part of a regular health exam.  If you are 34 or older, have a CBE every year. Also consider having a breast X-ray (mammogram) every year.  If you have a family history of breast cancer, talk to your health care provider about genetic screening.  If you are at high risk for breast cancer, talk  to your health care provider about having an MRI and a mammogram every year.  Breast cancer gene (BRCA) assessment is recommended for women who have family members with BRCA-related cancers. BRCA-related cancers include:  Breast.  Ovarian.  Tubal.  Peritoneal cancers.  Results of the assessment will determine the need for genetic counseling and BRCA1 and BRCA2 testing. Cervical Cancer  Your health care provider may recommend that you be screened regularly for cancer of the pelvic organs (ovaries, uterus, and vagina).  This screening involves a pelvic examination, including checking for microscopic changes to the surface of your cervix (Pap test). You may be encouraged to have this screening done every 3 years, beginning at age 24.  For women ages 66-65, health care providers may recommend pelvic exams and Pap testing every 3 years, or they may recommend the Pap and pelvic exam, combined with testing for human papilloma virus (HPV), every 5 years. Some types of HPV increase your risk of cervical cancer. Testing for HPV may also be done on women of any age with unclear Pap test results.  Other health care providers may not recommend any screening for nonpregnant women who are considered low risk for pelvic cancer and who do not have symptoms. Ask your health care provider if a screening pelvic exam is right for you.  If you have had past treatment for cervical cancer or a condition that could lead to cancer, you need Pap tests and screening for cancer for at least 20 years after your treatment. If Pap tests have been discontinued, your risk factors (such as having a new sexual partner) need to be reassessed to determine if screening should resume. Some women have medical problems that increase the chance of getting cervical cancer. In these cases, your health care provider may recommend more frequent screening and Pap tests. Colorectal Cancer  This type of cancer can be detected and often prevented.  Routine colorectal cancer screening usually begins at 56 years of age and continues through 56 years of age.  Your health care provider may recommend screening at an earlier age if you have risk factors for colon cancer.  Your health care provider may also recommend using home test kits to check for hidden blood in the stool.  A small camera at the end of a tube can be used to examine your colon directly (sigmoidoscopy or colonoscopy). This is done to check for the earliest forms of colorectal cancer.  Routine  screening usually begins at age 41.  Direct examination of the colon should be repeated every 5-10 years through 56 years of age. However, you may need to be screened more often if early forms of precancerous polyps or small growths are found. Skin Cancer  Check your skin from head to toe regularly.  Tell your health care provider about any new moles or changes in moles, especially if there is a change in a mole's shape or color.  Also tell your health care provider if you have a mole that is larger than the size of a pencil eraser.  Always use sunscreen. Apply sunscreen liberally and repeatedly throughout the day.  Protect yourself by wearing long sleeves, pants, a wide-brimmed hat, and sunglasses whenever you are outside. Heart disease, diabetes, and high blood pressure  High blood pressure causes heart disease and increases the risk of stroke. High blood pressure is more likely to develop in:  People who have blood pressure in the high end of the normal range (130-139/85-89 mm Hg).  People who are overweight or obese.  People who are African American.  If you are 59-24 years of age, have your blood pressure checked every 3-5 years. If you are 34 years of age or older, have your blood pressure checked every year. You should have your blood pressure measured twice-once when you are at a hospital or clinic, and once when you are not at a hospital or clinic. Record the average of the two measurements. To check your blood pressure when you are not at a hospital or clinic, you can use:  An automated blood pressure machine at a pharmacy.  A home blood pressure monitor.  If you are between 29 years and 60 years old, ask your health care provider if you should take aspirin to prevent strokes.  Have regular diabetes screenings. This involves taking a blood sample to check your fasting blood sugar level.  If you are at a normal weight and have a low risk for diabetes, have this test once  every three years after 56 years of age.  If you are overweight and have a high risk for diabetes, consider being tested at a younger age or more often. Preventing infection Hepatitis B  If you have a higher risk for hepatitis B, you should be screened for this virus. You are considered at high risk for hepatitis B if:  You were born in a country where hepatitis B is common. Ask your health care provider which countries are considered high risk.  Your parents were born in a high-risk country, and you have not been immunized against hepatitis B (hepatitis B vaccine).  You have HIV or AIDS.  You use needles to inject street drugs.  You live with someone who has hepatitis B.  You have had sex with someone who has hepatitis B.  You get hemodialysis treatment.  You take certain medicines for conditions, including cancer, organ transplantation, and autoimmune conditions. Hepatitis C  Blood testing is recommended for:  Everyone born from [birth year] through 1965.  Anyone with known risk factors for hepatitis C. Sexually transmitted infections (STIs)  You should be screened for sexually transmitted infections (STIs) including gonorrhea and chlamydia if:  You are sexually active and are younger than 56 years of age.  You are older than 56 years of age and your health care provider tells you that you are at risk for this type of infection.  Your sexual activity has changed since you were last screened and you are at an increased risk for chlamydia or gonorrhea. Ask your health care provider if you are at risk.  If you do not have HIV, but are at risk, it may be recommended that you take a prescription medicine daily to prevent HIV infection. This is called pre-exposure prophylaxis (PrEP). You are considered at risk if:  You are sexually active and do not regularly use condoms or know the HIV status of your partner(s).  You take drugs by injection.  You are sexually active with a partner  who has HIV. Talk with your health care provider about whether you are at high risk of being infected with HIV. If you choose to begin PrEP, you should first be tested for HIV. You should then be tested every 3 months for as long as you are taking PrEP. Pregnancy  If you are premenopausal and you may become pregnant, ask your health care provider about preconception counseling.  If you may become pregnant, take 400 to 800 micrograms (mcg) of folic acid  every day.  If you want to prevent pregnancy, talk to your health care provider about birth control (contraception). Osteoporosis and menopause  Osteoporosis is a disease in which the bones lose minerals and strength with aging. This can result in serious bone fractures. Your risk for osteoporosis can be identified using a bone density scan.  If you are 4 years of age or older, or if you are at risk for osteoporosis and fractures, ask your health care provider if you should be screened.  Ask your health care provider whether you should take a calcium or vitamin D supplement to lower your risk for osteoporosis.  Menopause may have certain physical symptoms and risks.  Hormone replacement therapy may reduce some of these symptoms and risks. Talk to your health care provider about whether hormone replacement therapy is right for you. Follow these instructions at home:  Schedule regular health, dental, and eye exams.  Stay current with your immunizations.  Do not use any tobacco products including cigarettes, chewing tobacco, or electronic cigarettes.  If you are pregnant, do not drink alcohol.  If you are breastfeeding, limit how much and how often you drink alcohol.  Limit alcohol intake to no more than 1 drink per day for nonpregnant women. One drink equals 12 ounces of beer, 5 ounces of wine, or 1 ounces of hard liquor.  Do not use street drugs.  Do not share needles.  Ask your health care provider for help if you need support  or information about quitting drugs.  Tell your health care provider if you often feel depressed.  Tell your health care provider if you have ever been abused or do not feel safe at home. This information is not intended to replace advice given to you by your health care provider. Make sure you discuss any questions you have with your health care provider. Document Released: 02/20/2011 Document Revised: 01/13/2016 Document Reviewed: 05/11/2015 Elsevier Interactive Patient Education  2017 [employer].

## 2016-12-21 NOTE — Progress Notes (Signed)
Subjective:    Patient ID: Kimberly Obrien, female    DOB: 1960/12/16, 56 y.o.   MRN: [id number]  PT presents to the office today for CPE with pap.  Gynecologic Exam  The patient's pertinent negatives include no genital lesions, vaginal bleeding or vaginal discharge. This is a chronic problem. The current episode started more than 1 year ago.  Hyperlipidemia  This is a chronic problem. The current episode started more than 1 year ago. The problem is uncontrolled. Recent lipid tests were reviewed and are high. Exacerbating diseases include obesity. Pertinent negatives include no shortness of breath. Current antihyperlipidemic treatment includes diet change. The current treatment provides mild improvement of lipids.  Anxiety  Presents for follow-up visit. Symptoms include excessive worry and nervous/anxious behavior. Patient reports no confusion, depressed mood or shortness of breath. Symptoms occur occasionally.    COPD PT currently taking Spiriva daily, and albuterol inhaler 2-3 times a week with increased pollen. A Fib/ PE Taking warfarin daily. Stable.     Review of Systems  Respiratory: Negative for shortness of breath.   Genitourinary: Negative for vaginal discharge.  Psychiatric/Behavioral: Negative for confusion. The patient is nervous/anxious.   All other systems reviewed and are negative.      Objective:   Physical Exam  Constitutional: She is oriented to person, place, and time. She appears well-developed and well-nourished. No distress.  HENT:  Head: Normocephalic and atraumatic.  Right Ear: External ear normal.  Left Ear: External ear normal.  Nose: Nose normal.  Mouth/Throat: Oropharynx is clear and moist.  Eyes: Pupils are equal, round, and reactive to light.  Neck: Normal range of motion. Neck supple. No thyromegaly present.  Cardiovascular: Normal rate, regular rhythm, normal heart sounds and intact distal pulses.   No murmur heard. Pulmonary/Chest: Effort  normal and breath sounds normal. No respiratory distress. She has no wheezes. Right breast exhibits no inverted nipple, no mass, no nipple discharge, no skin change and no tenderness. Left breast exhibits no inverted nipple, no mass, no nipple discharge, no skin change and no tenderness. Breasts are symmetrical.  Abdominal: Soft. Bowel sounds are normal. She exhibits no distension. There is no tenderness.  Genitourinary: Vagina normal.  Genitourinary Comments: Bimanual exam- no adnexal masses or tenderness, ovaries nonpalpable   Cervix parous and pink- No discharge   Musculoskeletal: Normal range of motion. She exhibits no edema or tenderness.  Neurological: She is alert and oriented to person, place, and time.  Skin: Skin is warm and dry.  Psychiatric: She has a normal mood and affect. Her behavior is normal. Judgment and thought content normal.  Vitals reviewed.    BP 111/66   Pulse 91   Temp 97.3 F (36.3 C) (Oral)   Ht '5\' 6"'  (1.676 m)   Wt 217 lb 3.2 oz (98.5 kg)   BMI 35.06 kg/m      Assessment & Plan:  1. Chronic atrial fibrillation (HCC) - CMP14+EGFR - CBC with Differential/Platelet  2. Chronic obstructive pulmonary disease, unspecified COPD type ([place]) - CMP14+EGFR - CBC with Differential/Platelet  3. Hyperlipidemia, unspecified hyperlipidemia type - CMP14+EGFR - Lipid panel - CBC with Differential/Platelet  4. History of pulmonary embolism - CMP14+EGFR - CBC with Differential/Platelet  5. GAD (generalized anxiety disorder) - CMP14+EGFR - CBC with Differential/Platelet  6. GYN exam for high-risk Medicare patient - CMP14+EGFR - CBC with Differential/Platelet - Pap IG (Image Guided)  7. Other fatigue - CMP14+EGFR - CBC with Differential/Platelet - Thyroid Panel With TSH - VITAMIN D  25 Hydroxy (Vit-D Deficiency, Fractures)  8. Colon cancer screening - Fecal occult blood, imunochemical; Future   Continue all meds Labs pending Health Maintenance  reviewed Diet and exercise encouraged RTO 6 months   Evelina Dun, FNP

## 2016-12-22 LAB — CBC WITH DIFFERENTIAL/PLATELET
Basophils Absolute: 0 10*3/uL (ref 0.0–0.2)
Basos: 0 %
EOS (ABSOLUTE): 0.1 10*3/uL (ref 0.0–0.4)
Eos: 1 %
Hematocrit: 42.1 % (ref 34.0–46.6)
Hemoglobin: 14.1 g/dL (ref 11.1–15.9)
Immature Grans (Abs): 0 10*3/uL (ref 0.0–0.1)
Immature Granulocytes: 0 %
Lymphocytes Absolute: 1.6 10*3/uL (ref 0.7–3.1)
Lymphs: 24 %
MCH: 27.9 pg (ref 26.6–33.0)
MCHC: 33.5 g/dL (ref 31.5–35.7)
MCV: 83 fL (ref 79–97)
Monocytes Absolute: 0.3 10*3/uL (ref 0.1–0.9)
Monocytes: 4 %
Neutrophils Absolute: 4.7 10*3/uL (ref 1.4–7.0)
Neutrophils: 71 %
Platelets: 157 10*3/uL (ref 150–379)
RBC: 5.05 x10E6/uL (ref 3.77–5.28)
RDW: 14.7 % (ref 12.3–15.4)
WBC: 6.8 10*3/uL (ref 3.4–10.8)

## 2016-12-22 LAB — THYROID PANEL WITH TSH
Free Thyroxine Index: 2.9 (ref 1.2–4.9)
T3 Uptake Ratio: 28 % (ref 24–39)
T4, Total: 10.4 ug/dL (ref 4.5–12.0)
TSH: 1.58 u[IU]/mL (ref 0.450–4.500)

## 2016-12-22 LAB — LIPID PANEL
Chol/HDL Ratio: 4.9 ratio — ABNORMAL HIGH (ref 0.0–4.4)
Cholesterol, Total: 172 mg/dL (ref 100–199)
HDL: 35 mg/dL — ABNORMAL LOW (ref >39)
LDL Calculated: 106 mg/dL — ABNORMAL HIGH (ref 0–99)
Triglycerides: 153 mg/dL — ABNORMAL HIGH (ref 0–149)
VLDL Cholesterol Cal: 31 mg/dL (ref 5–40)

## 2016-12-22 LAB — CMP14+EGFR
ALT: 132 IU/L — ABNORMAL HIGH (ref 0–32)
AST: 171 IU/L — ABNORMAL HIGH (ref 0–40)
Albumin/Globulin Ratio: 1.4 (ref 1.2–2.2)
Albumin: 3.7 g/dL (ref 3.5–5.5)
Alkaline Phosphatase: 121 IU/L — ABNORMAL HIGH (ref 39–117)
BUN/Creatinine Ratio: 9 (ref 9–23)
BUN: 7 mg/dL (ref 6–24)
Bilirubin Total: 1 mg/dL (ref 0.0–1.2)
CO2: 25 mmol/L (ref 18–29)
Calcium: 8.9 mg/dL (ref 8.7–10.2)
Chloride: 104 mmol/L (ref 96–106)
Creatinine, Ser: 0.8 mg/dL (ref 0.57–1.00)
GFR calc Af Amer: 95 mL/min/{1.73_m2} (ref >59)
GFR calc non Af Amer: 83 mL/min/{1.73_m2} (ref >59)
Globulin, Total: 2.6 g/dL (ref 1.5–4.5)
Glucose: 147 mg/dL — ABNORMAL HIGH (ref 65–99)
Potassium: 3.6 mmol/L (ref 3.5–5.2)
Sodium: 145 mmol/L — ABNORMAL HIGH (ref 134–144)
Total Protein: 6.3 g/dL (ref 6.0–8.5)

## 2016-12-22 LAB — VITAMIN D 25 HYDROXY (VIT D DEFICIENCY, FRACTURES): Vit D, 25-Hydroxy: 22 ng/mL — ABNORMAL LOW (ref 30.0–100.0)

## 2016-12-22 LAB — PAP IG (IMAGE GUIDED): PAP Smear Comment: 0

## 2016-12-23 ENCOUNTER — Other Ambulatory Visit: Payer: Self-pay | Admitting: Family

## 2016-12-25 ENCOUNTER — Telehealth: Payer: Self-pay | Admitting: Family Medicine

## 2016-12-26 ENCOUNTER — Encounter: Payer: Self-pay | Admitting: Cardiovascular Disease

## 2016-12-26 NOTE — Telephone Encounter (Signed)
Faxed labs to [facility] gastro

## 2017-01-01 DIAGNOSIS — J45909 Unspecified asthma, uncomplicated: Secondary | ICD-10-CM | POA: Diagnosis not present

## 2017-01-01 DIAGNOSIS — J811 Chronic pulmonary edema: Secondary | ICD-10-CM | POA: Diagnosis not present

## 2017-01-02 ENCOUNTER — Telehealth: Payer: Self-pay | Admitting: Family Medicine

## 2017-01-02 MED ORDER — VITAMIN D (ERGOCALCIFEROL) 1.25 MG (50000 UNIT) PO CAPS: 50000.0000 [IU] | ORAL_CAPSULE | ORAL | 1 refills | Status: DC

## 2017-01-02 NOTE — Telephone Encounter (Signed)
Prescription for Vitamin D was sent to [facility], patient aware.

## 2017-01-03 DIAGNOSIS — K754 Autoimmune hepatitis: Secondary | ICD-10-CM | POA: Diagnosis not present

## 2017-01-09 ENCOUNTER — Encounter: Payer: Self-pay | Admitting: Pharmacist

## 2017-01-11 ENCOUNTER — Encounter: Payer: Self-pay | Admitting: Cardiovascular Disease

## 2017-01-11 ENCOUNTER — Ambulatory Visit (INDEPENDENT_AMBULATORY_CARE_PROVIDER_SITE_OTHER): Payer: Medicare HMO | Admitting: Cardiovascular Disease

## 2017-01-11 VITALS — BP 110/70 | HR 73 | Ht 66.0 in | Wt 216.8 lb

## 2017-01-11 DIAGNOSIS — R0602 Shortness of breath: Secondary | ICD-10-CM | POA: Diagnosis not present

## 2017-01-11 DIAGNOSIS — R000 Tachycardia, unspecified: Secondary | ICD-10-CM

## 2017-01-11 NOTE — Progress Notes (Signed)
Chief Complaint  Patient presents with  . Shortness of Breath   History of Present Illness: 56 yo female with history of COPD/asthma, arthritis and pulmonary embolism who is here today for cardiac follow up. She had been followed in our [facility] office by Dr. Jacinta Shoe. I met her in February 2017. She has been evaluated over the years for tachycardia/palpitations/fatigue/dyspnea. Echo April 2015 showed normal LV systolic and diastolic function with no valve issues. Right and left heart cath 2015 with no evidence of CAD and normal PA pressures. Cardiac monitor 2016 with rare PVCS, PACs. CTA chest 2015 with no PE. She did have a PE in 1999 with recurrence in 2003. She has remained on coumadin. She has a history of prior tobacco abuse and COPD. She has seen Pulmonary and is felt to have interstitial lung disease.  She is seeing Dr. Luan Pulling in [place].  Of note, she does not have atrial fibrillation. She is on coumadin because of her history of PE.   She is here today for follow up. The patient denies any chest pain, dyspnea, palpitations, lower extremity edema, orthopnea, PND, dizziness, near syncope or syncope.    Primary Care Physician: Claretta Fraise, MD [facility])   Past Medical History:  Diagnosis Date  . Anticoagulation monitoring by pharmacist   . Arthritis   . Asthma   . Autoimmune hepatitis ([place])   . Cataract   . COPD (chronic obstructive pulmonary disease) ([place])   . Hyperlipidemia   . PE (pulmonary embolism)     Past Surgical History:  Procedure Laterality Date  . ABDOMINAL HYSTERECTOMY    . CATARACT EXTRACTION, BILATERAL Bilateral   . CHOLECYSTECTOMY    . EYE SURGERY     Cataracts  . INCONTINENCE SURGERY  2009  . LEFT AND RIGHT HEART CATHETERIZATION WITH CORONARY ANGIOGRAM N/A 05/13/2014   Procedure: LEFT AND RIGHT HEART CATHETERIZATION WITH CORONARY ANGIOGRAM;  Surgeon: [name], MD;  Location: [facility] CATH LAB;  Service: Cardiovascular;  Laterality:  N/A;  . SPINE SURGERY     L5 S1 fusion  . TUBAL LIGATION    . WRIST SURGERY Right    Had surgery twice to shave bone for blood circulation improvement.    Current Outpatient Prescriptions  Medication Sig Dispense Refill  . albuterol (PROVENTIL HFA;VENTOLIN HFA) 108 (90 BASE) MCG/ACT inhaler Inhale into the lungs every 6 (six) hours as needed for wheezing or shortness of breath.    . fluticasone (FLONASE) 50 MCG/ACT nasal spray Place 2 sprays into both nostrils daily. 16 g 6  . predniSONE (DELTASONE) 10 MG tablet Take 10 mg by mouth daily.    Marland Kitchen tiotropium (SPIRIVA) 18 MCG inhalation capsule Place 18 mcg into inhaler and inhale daily.    . Vitamin D, Ergocalciferol, (DRISDOL) 50000 units CAPS capsule Take 1 capsule (50,000 Units total) by mouth every 7 (seven) days. 12 capsule 1  . warfarin (COUMADIN) 5 MG tablet TAKE ONE-HALF TO ONE TABLET BY MOUTH ONCE DAILY 50 tablet 2   No current facility-administered medications for this visit.     Allergies  Allergen Reactions  . Aciphex [Rabeprazole Sodium] Rash  . Diflucan [Fluconazole] Rash  . Lansoprazole Hives  . Metronidazole Other (See Comments)    Dizziness, nausea, dry mouth and some shortness of breath  . Vancomycin Diarrhea  . Claritin [Loratadine] Other (See Comments)    Tired   . Crestor [Rosuvastatin] Other (See Comments)    Elevated blood glucose and abdominal pain  . Doxycycline  Rash  . Ibuprofen Other (See Comments)    Upset stomach  . Lipitor [Atorvastatin] Other (See Comments)    Myalgias, leg pain.  Problems with pancreas, sugars went up also.  Santiago Bur [Nitrofurantoin Macrocrystal] Nausea And Vomiting  . Metoprolol Nausea And Vomiting  . Nsaids Other (See Comments)    Upset stomach  . Omeprazole     bloating  . Rabeprazole Rash  . Septra [Sulfamethoxazole-Trimethoprim] Nausea And Vomiting    Social History   Social History  . Marital status: Married    Spouse name: N/A  . Number of children: 4  . Years  of education: N/A   Occupational History  . Unemployed    Social History Main Topics  . Smoking status: Former Smoker    Packs/day: 2.00    Years: 50.00    Types: Cigarettes    Start date: 08/22/1977    Quit date: 04/21/2012  . Smokeless tobacco: Never Used  . Alcohol use No  . Drug use: No  . Sexual activity: Yes   Other Topics Concern  . Not on file   Social History Narrative  . No narrative on file    Family History  Problem Relation Age of Onset  . Cirrhosis Mother   . Hyperlipidemia Father   . Heart failure Father   . Obesity Daughter   . Hypertension Brother   . Heart attack Neg Hx     Review of Systems:  As stated in the HPI and otherwise negative.   BP 110/70   Pulse 73   Ht _0  (1.676 m)   Wt 216 lb 12.8 oz (98.3 kg)   SpO2 94%   BMI 34.99 kg/m   Physical Examination: General: Well developed, well nourished, NAD  HEENT: OP clear, mucus membranes moist  SKIN: warm, dry. No rashes. Neuro: No focal deficits  Musculoskeletal: Muscle strength 5/5 all ext  Psychiatric: Mood and affect normal  Neck: No JVD, no carotid bruits, no thyromegaly, no lymphadenopathy.  Lungs:Clear bilaterally, no wheezes, rhonci, crackles Cardiovascular: Regular rate and rhythm. No murmurs, gallops or rubs. Abdomen:Soft. Bowel sounds present. Non-tender.  Extremities: No lower extremity edema. Pulses are 2 + in the bilateral DP/PT.  EKG:  EKG is  ordered today. The ekg ordered today demonstrates NSR, rate 67 bpm  Recent Labs: 12/21/2016: ALT 132; BUN 7; Creatinine, Ser 0.80; Platelets 157; Potassium 3.6; Sodium 145; TSH 1.580   Lipid Panel    Component Value Date/Time   CHOL 172 12/21/2016 1244   CHOL 208 (H) 02/04/2013 1317   TRIG 153 (H) 12/21/2016 1244   TRIG 110 12/21/2014 0000   TRIG 136 02/04/2013 1317   HDL 35 (L) 12/21/2016 1244   HDL 60 12/21/2014 0000   HDL 40 02/04/2013 1317   CHOLHDL 4.9 (H) 12/21/2016 1244   LDLCALC 106 (H) 12/21/2016 1244   LDLCALC 65  08/04/2013 1332   LDLCALC 141 (H) 02/04/2013 1317     Wt Readings from Last 3 Encounters:  01/11/17 216 lb 12.8 oz (98.3 kg)  12/21/16 217 lb 3.2 oz (98.5 kg)  10/12/16 220 lb (99.8 kg)     Other studies Reviewed: Additional studies/ records that were reviewed today include: . Review of the above records demonstrates:    Assessment and Plan:   1. Dyspnea: Her dyspnea is felt to be related to her lung disease. She has ILD. She has normal LV function, no valve disease, no CAD. She will need to continue to follow with pulmonary.  2. Sinus Tachycardia: Elevated heart rates due to exertion with underlying lung disease. She does not have atrial fibrillation. This is documented in the chart as the reason for her coumadin but this is inaccurate. I have reviewed all old EKGs today and these all shows sinus. She clearly remembers being started on coumadin for her history of PE.   Current medicines are reviewed at length with the patient today.  The patient does not have concerns regarding medicines.  The following changes have been made:  no change  Labs/ tests ordered today include: None  Orders Placed This Encounter  Procedures  . EKG 12-Lead   Disposition:   FU with me in 12  months  Signed, Lauree Chandler, MD 01/11/2017 1:20 PM    [facility] [address], [place], [place]  [address] Phone: [phone]; Fax: [phone]

## 2017-01-11 NOTE — Patient Instructions (Signed)
Medication Instructions:  Your physician recommends that you continue on your current medications as directed. Please refer to the Current Medication list given to you today.   Labwork: none  Testing/Procedures: none  Follow-Up: Your physician wants you to follow-up in: 2 years.  You will receive a reminder letter in the mail two months in advance. If you don't receive a letter, please call our office to schedule the follow-up appointment.   Any Other Special Instructions Will Be Listed Below (If Applicable).     If you need a refill on your cardiac medications before your next appointment, please call your pharmacy.   

## 2017-01-16 ENCOUNTER — Ambulatory Visit (INDEPENDENT_AMBULATORY_CARE_PROVIDER_SITE_OTHER): Payer: Medicare HMO | Admitting: Pharmacist Clinician (PhC)/ Clinical Pharmacy Specialist

## 2017-01-16 DIAGNOSIS — Z86711 Personal history of pulmonary embolism: Secondary | ICD-10-CM

## 2017-01-16 DIAGNOSIS — I482 Chronic atrial fibrillation, unspecified: Secondary | ICD-10-CM

## 2017-01-16 DIAGNOSIS — M5442 Lumbago with sciatica, left side: Secondary | ICD-10-CM | POA: Diagnosis not present

## 2017-01-16 LAB — POCT INR: INR: 1.2

## 2017-01-19 DIAGNOSIS — H5210 Myopia, unspecified eye: Secondary | ICD-10-CM | POA: Diagnosis not present

## 2017-01-19 DIAGNOSIS — Z0100 Encounter for examination of eyes and vision without abnormal findings: Secondary | ICD-10-CM | POA: Diagnosis not present

## 2017-01-30 ENCOUNTER — Encounter: Payer: Self-pay | Admitting: Family Medicine

## 2017-01-30 ENCOUNTER — Ambulatory Visit (INDEPENDENT_AMBULATORY_CARE_PROVIDER_SITE_OTHER): Payer: Medicare HMO | Admitting: Family Medicine

## 2017-01-30 ENCOUNTER — Ambulatory Visit (INDEPENDENT_AMBULATORY_CARE_PROVIDER_SITE_OTHER): Payer: Medicare HMO

## 2017-01-30 VITALS — BP 114/67 | HR 74 | Temp 97.0°F | Ht 66.0 in | Wt 215.0 lb

## 2017-01-30 DIAGNOSIS — Z7901 Long term (current) use of anticoagulants: Secondary | ICD-10-CM | POA: Insufficient documentation

## 2017-01-30 DIAGNOSIS — K580 Irritable bowel syndrome with diarrhea: Secondary | ICD-10-CM

## 2017-01-30 DIAGNOSIS — R1011 Right upper quadrant pain: Secondary | ICD-10-CM

## 2017-01-30 DIAGNOSIS — R748 Abnormal levels of other serum enzymes: Secondary | ICD-10-CM | POA: Diagnosis not present

## 2017-01-30 DIAGNOSIS — I482 Chronic atrial fibrillation, unspecified: Secondary | ICD-10-CM

## 2017-01-30 DIAGNOSIS — J811 Chronic pulmonary edema: Secondary | ICD-10-CM | POA: Diagnosis not present

## 2017-01-30 LAB — MICROSCOPIC EXAMINATION
Epithelial Cells (non renal): >10 /hpf — AB (ref 0–10)
Renal Epithel, UA: NONE SEEN /hpf

## 2017-01-30 LAB — URINALYSIS, COMPLETE
Bilirubin, UA: NEGATIVE
Glucose, UA: NEGATIVE
Ketones, UA: NEGATIVE
Nitrite, UA: NEGATIVE
Specific Gravity, UA: >1.03 — ABNORMAL HIGH (ref 1.005–1.030)
Urobilinogen, Ur: 0.2 mg/dL (ref 0.2–1.0)
pH, UA: 5 (ref 5.0–7.5)

## 2017-01-30 LAB — COAGUCHEK XS/INR WAIVED
INR: 2 — ABNORMAL HIGH (ref 0.9–1.1)
Prothrombin Time: 23.6 s

## 2017-01-30 MED ORDER — LEVOFLOXACIN 500 MG PO TABS: 500.0000 mg | ORAL_TABLET | Freq: Every day | ORAL | 0 refills | Status: DC

## 2017-01-30 MED ORDER — DEXLANSOPRAZOLE 60 MG PO CPDR: 60.0000 mg | DELAYED_RELEASE_CAPSULE | Freq: Every day | ORAL | 5 refills | Status: DC

## 2017-01-30 NOTE — Progress Notes (Signed)
Subjective:  Patient ID: Kimberly Obrien, female    DOB: Sep 27, 1960  Age: 56 y.o. MRN: [id number]  CC: Abdominal Pain (pt here today c/o stomach pains on the right side and she is worried about the possibility of a "blood clot" because she had to stop her Coumadin for 6 days )    HPI Kimberly Obrien presents for 5 days of RUQ burning & gurgling. Has had cholecystectomy. Not responsive to either making it better or worse. She's had some loose bowel movements 2-3 times that day. That started a couple days later. She says that her stomach feels like it's on fire at times. She is concerned that she had to come off of her warfarin for 6 days prior to getting a cervical epidural steroid injection. However she's been back on the INR back on the warfarin for 2 weeks as of today. Patient noted to have a history of irritable bowel syndrome. She says that she has burning when she takes lansoprazole that is even worse than her symptom. Omeprazole causes severe nausea and rabeprazole has caused a severe rash. She can take Nexium.   History Kimberly Obrien has a past medical history of Anticoagulation monitoring by pharmacist; Arthritis; Asthma; Autoimmune hepatitis (Kimberly Obrien); Cataract; COPD (chronic obstructive pulmonary disease) (Kimberly Obrien); Hyperlipidemia; and PE (pulmonary embolism).   She has a past surgical history that includes Tubal ligation; Wrist surgery (Right); Cholecystectomy; Spine surgery; Abdominal hysterectomy; Eye surgery; left and right heart catheterization with coronary angiogram (N/A, 05/13/2014); Incontinence surgery (2009); and Cataract extraction, bilateral (Bilateral).   Her family history includes Cirrhosis in her mother; Heart failure in her father; Hyperlipidemia in her father; Hypertension in her brother; Obesity in her daughter.She reports that she quit smoking about 4 years ago. Her smoking use included Cigarettes. She started smoking about 39 years ago. She has a 100.00 pack-year smoking  history. She has never used smokeless tobacco. She reports that she does not drink alcohol or use drugs.    ROS Review of Systems  Constitutional: Negative for activity change, appetite change and fever.  HENT: Negative for congestion, rhinorrhea and sore throat.   Eyes: Negative for visual disturbance.  Respiratory: Negative for cough and shortness of breath.   Cardiovascular: Negative for chest pain and palpitations.  Gastrointestinal: Positive for abdominal distention, abdominal pain, diarrhea and nausea. Negative for blood in stool, constipation, rectal pain and vomiting.  Genitourinary: Negative for dysuria.  Musculoskeletal: Negative for arthralgias and myalgias.    Objective:  BP 114/67   Pulse 74   Temp 97 F (36.1 C) (Oral)   Ht '5\' 6"'  (1.676 m)   Wt 215 lb (97.5 kg)   BMI 34.70 kg/m   BP Readings from Last 3 Encounters:  01/30/17 114/67  01/11/17 110/70  12/21/16 111/66    Wt Readings from Last 3 Encounters:  01/30/17 215 lb (97.5 kg)  01/11/17 216 lb 12.8 oz (98.3 kg)  12/21/16 217 lb 3.2 oz (98.5 kg)     Physical Exam  Constitutional: She is oriented to person, place, and time. She appears well-developed and well-nourished.  HENT:  Head: Normocephalic and atraumatic.  Cardiovascular: Normal rate and regular rhythm.   No murmur heard. Pulmonary/Chest: Effort normal and breath sounds normal.  Abdominal: Soft. She exhibits distension (with constant borborigmi). She exhibits no mass. There is tenderness (RUQ). There is no rebound and no guarding.  Musculoskeletal: Normal range of motion.  Neurological: She is alert and oriented to person, place, and time.  Skin: Skin  is warm and dry.  Psychiatric: She has a normal mood and affect. Her behavior is normal.      Assessment & Plan:   Kimberly Obrien was seen today for abdominal pain.  Diagnoses and all orders for this visit:  Irritable bowel syndrome with diarrhea  RUQ pain -     Amylase -     CBC with  Differential/Platelet -     CMP14+EGFR -     Lipase -     DG Abd 2 Views; Future -     Urinalysis, Complete -     H. pylori antigen, stool; Future -     Fecal occult blood, imunochemical -     Urine Culture  Chronic atrial fibrillation (HCC) -     CoaguChek XS/INR Waived  Current use of long term anticoagulation -     CoaguChek XS/INR Waived  Chronic pulmonary edema -     CoaguChek XS/INR Waived  Other orders -     dexlansoprazole (DEXILANT) 60 MG capsule; Take 1 capsule (60 mg total) by mouth daily. On an empty stomach, then do not eat for 1 hour -     levofloxacin (LEVAQUIN) 500 MG tablet; Take 1 tablet (500 mg total) by mouth daily. -     Microscopic Examination       I am having Kimberly Obrien start on dexlansoprazole and levofloxacin. I am also having her maintain her albuterol, fluticasone, tiotropium, warfarin, Vitamin D (Ergocalciferol), and predniSONE.  Allergies as of 01/30/2017      Reactions   Aciphex [rabeprazole Sodium] Rash   Diflucan [fluconazole] Rash   Lansoprazole Hives   Metronidazole Other (See Comments)   Dizziness, nausea, dry mouth and some shortness of breath   Vancomycin Diarrhea   Claritin [loratadine] Other (See Comments)   Tired   Crestor [rosuvastatin] Other (See Comments)   Elevated blood glucose and abdominal pain   Doxycycline Rash   Ibuprofen Other (See Comments)   Upset stomach   Lipitor [atorvastatin] Other (See Comments)   Myalgias, leg pain.  Problems with pancreas, sugars went up also.   Macrobid [nitrofurantoin Macrocrystal] Nausea And Vomiting   Metoprolol Nausea And Vomiting   Nsaids Other (See Comments)   Upset stomach   Omeprazole    bloating   Rabeprazole Rash   Septra [sulfamethoxazole-trimethoprim] Nausea And Vomiting      Medication List       Accurate as of 01/30/17  9:46 PM. Always use your most recent med list.          albuterol 108 (90 Base) MCG/ACT inhaler Commonly known as:  PROVENTIL HFA;VENTOLIN  HFA Inhale into the lungs every 6 (six) hours as needed for wheezing or shortness of breath.   dexlansoprazole 60 MG capsule Commonly known as:  DEXILANT Take 1 capsule (60 mg total) by mouth daily. On an empty stomach, then do not eat for 1 hour   fluticasone 50 MCG/ACT nasal spray Commonly known as:  FLONASE Place 2 sprays into both nostrils daily.   levofloxacin 500 MG tablet Commonly known as:  LEVAQUIN Take 1 tablet (500 mg total) by mouth daily.   predniSONE 10 MG tablet Commonly known as:  DELTASONE Take 10 mg by mouth daily.   tiotropium 18 MCG inhalation capsule Commonly known as:  SPIRIVA Place 18 mcg into inhaler and inhale daily.   Vitamin D (Ergocalciferol) 50000 units Caps capsule Commonly known as:  DRISDOL Take 1 capsule (50,000 Units total) by mouth every 7 (seven) days.  warfarin 5 MG tablet Commonly known as:  COUMADIN TAKE ONE-HALF TO ONE TABLET BY MOUTH ONCE DAILY        Follow-up: Return in about 3 months (around 05/02/2017).  Claretta Fraise, M.D.

## 2017-01-31 ENCOUNTER — Telehealth: Payer: Self-pay | Admitting: Family Medicine

## 2017-01-31 ENCOUNTER — Other Ambulatory Visit: Payer: Self-pay | Admitting: Family Medicine

## 2017-01-31 ENCOUNTER — Other Ambulatory Visit: Payer: Medicare HMO

## 2017-01-31 DIAGNOSIS — R1011 Right upper quadrant pain: Secondary | ICD-10-CM | POA: Diagnosis not present

## 2017-01-31 LAB — CBC WITH DIFFERENTIAL/PLATELET
Basophils Absolute: 0 10*3/uL (ref 0.0–0.2)
Basos: 0 %
EOS (ABSOLUTE): 0.1 10*3/uL (ref 0.0–0.4)
Eos: 1 %
Hematocrit: 43.6 % (ref 34.0–46.6)
Hemoglobin: 14.2 g/dL (ref 11.1–15.9)
Immature Grans (Abs): 0 10*3/uL (ref 0.0–0.1)
Immature Granulocytes: 0 %
Lymphocytes Absolute: 2.2 10*3/uL (ref 0.7–3.1)
Lymphs: 25 %
MCH: 28.2 pg (ref 26.6–33.0)
MCHC: 32.6 g/dL (ref 31.5–35.7)
MCV: 87 fL (ref 79–97)
Monocytes Absolute: 0.4 10*3/uL (ref 0.1–0.9)
Monocytes: 5 %
Neutrophils Absolute: 5.8 10*3/uL (ref 1.4–7.0)
Neutrophils: 69 %
Platelets: 135 10*3/uL — ABNORMAL LOW (ref 150–379)
RBC: 5.04 x10E6/uL (ref 3.77–5.28)
RDW: 14.4 % (ref 12.3–15.4)
WBC: 8.5 10*3/uL (ref 3.4–10.8)

## 2017-01-31 LAB — AMYLASE: Amylase: 76 U/L (ref 31–124)

## 2017-01-31 LAB — CMP14+EGFR
ALT: 90 IU/L — ABNORMAL HIGH (ref 0–32)
AST: 76 IU/L — ABNORMAL HIGH (ref 0–40)
Albumin/Globulin Ratio: 1.7 (ref 1.2–2.2)
Albumin: 3.8 g/dL (ref 3.5–5.5)
Alkaline Phosphatase: 96 IU/L (ref 39–117)
BUN/Creatinine Ratio: 13 (ref 9–23)
BUN: 9 mg/dL (ref 6–24)
Bilirubin Total: 1 mg/dL (ref 0.0–1.2)
CO2: 24 mmol/L (ref 20–29)
Calcium: 9.2 mg/dL (ref 8.7–10.2)
Chloride: 103 mmol/L (ref 96–106)
Creatinine, Ser: 0.68 mg/dL (ref 0.57–1.00)
GFR calc Af Amer: 113 mL/min/{1.73_m2} (ref >59)
GFR calc non Af Amer: 98 mL/min/{1.73_m2} (ref >59)
Globulin, Total: 2.2 g/dL (ref 1.5–4.5)
Glucose: 98 mg/dL (ref 65–99)
Potassium: 3.5 mmol/L (ref 3.5–5.2)
Sodium: 143 mmol/L (ref 134–144)
Total Protein: 6 g/dL (ref 6.0–8.5)

## 2017-01-31 LAB — LIPASE: Lipase: 60 U/L (ref 14–72)

## 2017-01-31 MED ORDER — SUCRALFATE 1 G PO TABS: 1.0000 g | ORAL_TABLET | Freq: Three times a day (TID) | ORAL | 2 refills | Status: DC

## 2017-01-31 NOTE — Telephone Encounter (Signed)
Pt aware.

## 2017-01-31 NOTE — Telephone Encounter (Signed)
Patient already has appt to recheck protime 02/05/17 - discussed with patient in office today 01/31/17

## 2017-01-31 NOTE — Telephone Encounter (Signed)
Please advise 

## 2017-01-31 NOTE — Telephone Encounter (Signed)
Please contact the patient she should try sucralfate. Hopefully it will be less expensive, but has to be taken with each meal and at bedtime. Scrip sent in.

## 2017-01-31 NOTE — Addendum Note (Signed)
Addended by: Liliane Bade on: 01/31/2017 01:13 PM   Modules accepted: Orders

## 2017-02-01 ENCOUNTER — Encounter: Payer: Medicare HMO | Admitting: Pharmacist

## 2017-02-01 DIAGNOSIS — J811 Chronic pulmonary edema: Secondary | ICD-10-CM | POA: Diagnosis not present

## 2017-02-01 DIAGNOSIS — J45909 Unspecified asthma, uncomplicated: Secondary | ICD-10-CM | POA: Diagnosis not present

## 2017-02-01 LAB — FECAL OCCULT BLOOD, IMMUNOCHEMICAL: Fecal Occult Bld: NEGATIVE

## 2017-02-02 ENCOUNTER — Encounter: Payer: Medicare HMO | Admitting: Pharmacist Clinician (PhC)/ Clinical Pharmacy Specialist

## 2017-02-02 LAB — H. PYLORI ANTIGEN, STOOL: H pylori Ag, Stl: NEGATIVE

## 2017-02-05 ENCOUNTER — Encounter: Payer: Medicare HMO | Admitting: Pharmacist

## 2017-02-05 DIAGNOSIS — M50322 Other cervical disc degeneration at C5-C6 level: Secondary | ICD-10-CM | POA: Diagnosis not present

## 2017-02-05 DIAGNOSIS — G8929 Other chronic pain: Secondary | ICD-10-CM | POA: Diagnosis not present

## 2017-02-05 DIAGNOSIS — M542 Cervicalgia: Secondary | ICD-10-CM | POA: Diagnosis not present

## 2017-02-05 DIAGNOSIS — M50323 Other cervical disc degeneration at C6-C7 level: Secondary | ICD-10-CM | POA: Diagnosis not present

## 2017-02-06 ENCOUNTER — Ambulatory Visit (INDEPENDENT_AMBULATORY_CARE_PROVIDER_SITE_OTHER): Payer: Medicare HMO | Admitting: Pharmacist

## 2017-02-06 DIAGNOSIS — Z86711 Personal history of pulmonary embolism: Secondary | ICD-10-CM

## 2017-02-06 DIAGNOSIS — J811 Chronic pulmonary edema: Secondary | ICD-10-CM

## 2017-02-06 DIAGNOSIS — I482 Chronic atrial fibrillation, unspecified: Secondary | ICD-10-CM

## 2017-02-06 DIAGNOSIS — Z7901 Long term (current) use of anticoagulants: Secondary | ICD-10-CM | POA: Diagnosis not present

## 2017-02-06 LAB — COAGUCHEK XS/INR WAIVED
INR: 2.3 — ABNORMAL HIGH (ref 0.9–1.1)
Prothrombin Time: 27.6 s

## 2017-02-26 ENCOUNTER — Encounter: Payer: Self-pay | Admitting: *Deleted

## 2017-03-07 ENCOUNTER — Encounter: Payer: Self-pay | Admitting: Family Medicine

## 2017-03-07 ENCOUNTER — Ambulatory Visit (INDEPENDENT_AMBULATORY_CARE_PROVIDER_SITE_OTHER): Payer: Medicare HMO | Admitting: Family Medicine

## 2017-03-07 DIAGNOSIS — J811 Chronic pulmonary edema: Secondary | ICD-10-CM

## 2017-03-07 LAB — COAGUCHEK XS/INR WAIVED
INR: 2.3 — ABNORMAL HIGH (ref 0.9–1.1)
Prothrombin Time: 28.1 s

## 2017-03-07 MED ORDER — ESOMEPRAZOLE MAGNESIUM 40 MG PO CPDR: 40.0000 mg | DELAYED_RELEASE_CAPSULE | Freq: Every day | ORAL | 3 refills | Status: DC

## 2017-03-07 MED ORDER — LUBIPROSTONE 8 MCG PO CAPS: 8.0000 ug | ORAL_CAPSULE | Freq: Two times a day (BID) | ORAL | 2 refills | Status: DC

## 2017-03-07 NOTE — Progress Notes (Signed)
Subjective:  Patient ID: Kimberly Obrien, female    DOB: 01/11/1961  Age: 56 y.o. MRN: [id number]  CC: Gastroesophageal Reflux (pt here today c/o acid reflux and started taking Nexium OTC 3 days ago which is helping slightly.)   HPI PARLEE AMESCUA presents for Increasing reflux and substernal to right upper quadrant pain. No vomiting. She could not tolerate the sucralfate due to excessive nausea. The pain is similar to what has been present in the past. This time the reflux feels like bile coming up she points to the gallbladder fossa as the point of maximal pain. Of note is that she has had a cholecystectomy in the past.    Patient in for follow-up of chronic pulmonary edema for which she takes anticoagulants. Patient denies any recent excessive bleeding episodes including epistaxis, bleeding from the gums, genitalia, rectal bleeding or hematuria. Additionally there has been no excessive bruising.  Depression screen [facility] 2/9 03/07/2017 01/30/2017 12/21/2016  Decreased Interest 0 0 2  Down, Depressed, Hopeless 0 0 1  PHQ - 2 Score 0 0 3  Altered sleeping - - 3  Tired, decreased energy - - 3  Change in appetite - - 0  Feeling bad or failure about yourself  - - 0  Trouble concentrating - - 2  Moving slowly or fidgety/restless - - 0  Suicidal thoughts - - 0  PHQ-9 Score - - 11    History Hayes has a past medical history of Anticoagulation monitoring by pharmacist; Arthritis; Asthma; Autoimmune hepatitis ([place]); Cataract; COPD (chronic obstructive pulmonary disease) ([place]); Hyperlipidemia; and PE (pulmonary embolism).   She has a past surgical history that includes Tubal ligation; Wrist surgery (Right); Cholecystectomy; Spine surgery; Abdominal hysterectomy; Eye surgery; left and right heart catheterization with coronary angiogram (N/A, 05/13/2014); Incontinence surgery (2009); and Cataract extraction, bilateral (Bilateral).   Her family history includes Cirrhosis in her mother; Heart  failure in her father; Hyperlipidemia in her father; Hypertension in her brother; Obesity in her daughter.She reports that she quit smoking about 4 years ago. Her smoking use included Cigarettes. She started smoking about 39 years ago. She has a 100.00 pack-year smoking history. She has never used smokeless tobacco. She reports that she does not drink alcohol or use drugs.    ROS Review of Systems  Constitutional: Negative for activity change, appetite change and fever.  HENT: Negative for congestion, rhinorrhea and sore throat.   Eyes: Negative for visual disturbance.  Respiratory: Negative for cough and shortness of breath.   Cardiovascular: Negative for chest pain and palpitations.  Gastrointestinal: Positive for abdominal distention, abdominal pain and nausea. Negative for constipation and diarrhea.  Genitourinary: Negative for dysuria.  Musculoskeletal: Negative for arthralgias and myalgias.    Objective:  BP 109/65   Pulse 88   Temp (!) 97.4 F (36.3 C) (Oral)   Ht 5\' 6"  (1.676 m)   Wt 215 lb (97.5 kg)   BMI 34.70 kg/m   BP Readings from Last 3 Encounters:  03/07/17 109/65  01/30/17 114/67  01/11/17 110/70    Wt Readings from Last 3 Encounters:  03/07/17 215 lb (97.5 kg)  01/30/17 215 lb (97.5 kg)  01/11/17 216 lb 12.8 oz (98.3 kg)     Physical Exam  Constitutional: She is oriented to person, place, and time. She appears well-developed and well-nourished.  HENT:  Head: Normocephalic and atraumatic.  Cardiovascular: Normal rate and regular rhythm.   No murmur heard. Pulmonary/Chest: Effort normal and breath sounds normal.  Abdominal:  Soft. Bowel sounds are normal. She exhibits no mass. There is tenderness. There is no rebound and no guarding.  Neurological: She is alert and oriented to person, place, and time.  Skin: Skin is warm and dry.  Psychiatric: She has a normal mood and affect. Her behavior is normal.      Assessment & Plan:   Courtnie was seen  today for gastroesophageal reflux.  Diagnoses and all orders for this visit:  Chronic pulmonary edema -     CoaguChek XS/INR Waived  Other orders -     esomeprazole (NEXIUM) 40 MG capsule; Take 1 capsule (40 mg total) by mouth daily. -     lubiprostone (AMITIZA) 8 MCG capsule; Take 1 capsule (8 mcg total) by mouth 2 (two) times daily with a meal. For abdominal pain       I have discontinued Ms. Breece's levofloxacin and sucralfate. I am also having her start on esomeprazole and lubiprostone. Additionally, I am having her maintain her albuterol, fluticasone, tiotropium, warfarin, Vitamin D (Ergocalciferol), and predniSONE.  Allergies as of 03/07/2017      Reactions   Aciphex [rabeprazole Sodium] Rash   Diflucan [fluconazole] Rash   Lansoprazole Hives   Sucralfate Nausea Only   Metronidazole Other (See Comments)   Dizziness, nausea, dry mouth and some shortness of breath   Vancomycin Diarrhea   Claritin [loratadine] Other (See Comments)   Tired   Crestor [rosuvastatin] Other (See Comments)   Elevated blood glucose and abdominal pain   Doxycycline Rash   Ibuprofen Other (See Comments)   Upset stomach   Lipitor [atorvastatin] Other (See Comments)   Myalgias, leg pain.  Problems with pancreas, sugars went up also.   Macrobid [nitrofurantoin Macrocrystal] Nausea And Vomiting   Metoprolol Nausea And Vomiting   Nsaids Other (See Comments)   Upset stomach   Omeprazole    bloating   Rabeprazole Rash   Septra [sulfamethoxazole-trimethoprim] Nausea And Vomiting      Medication List       Accurate as of 03/07/17  6:25 PM. Always use your most recent med list.          albuterol 108 (90 Base) MCG/ACT inhaler Commonly known as:  PROVENTIL HFA;VENTOLIN HFA Inhale into the lungs every 6 (six) hours as needed for wheezing or shortness of breath.   esomeprazole 40 MG capsule Commonly known as:  NEXIUM Take 1 capsule (40 mg total) by mouth daily.   fluticasone 50 MCG/ACT  nasal spray Commonly known as:  FLONASE Place 2 sprays into both nostrils daily.   lubiprostone 8 MCG capsule Commonly known as:  AMITIZA Take 1 capsule (8 mcg total) by mouth 2 (two) times daily with a meal. For abdominal pain   predniSONE 10 MG tablet Commonly known as:  DELTASONE Take 10 mg by mouth daily.   tiotropium 18 MCG inhalation capsule Commonly known as:  SPIRIVA Place 18 mcg into inhaler and inhale daily.   Vitamin D (Ergocalciferol) 50000 units Caps capsule Commonly known as:  DRISDOL Take 1 capsule (50,000 Units total) by mouth every 7 (seven) days.   warfarin 5 MG tablet Commonly known as:  COUMADIN TAKE ONE-HALF TO ONE TABLET BY MOUTH ONCE DAILY        Follow-up: No Follow-up on file.  Claretta Fraise, M.D.

## 2017-03-08 ENCOUNTER — Telehealth: Payer: Self-pay | Admitting: Family Medicine

## 2017-03-08 NOTE — Telephone Encounter (Signed)
Will need to get something from [facility] faxed to initiate this prior authorization

## 2017-03-09 ENCOUNTER — Encounter: Payer: Self-pay | Admitting: Pharmacist Clinician (PhC)/ Clinical Pharmacy Specialist

## 2017-03-09 ENCOUNTER — Encounter: Payer: Self-pay | Admitting: Pharmacist

## 2017-03-09 ENCOUNTER — Telehealth: Payer: Self-pay | Admitting: Family Medicine

## 2017-03-11 ENCOUNTER — Other Ambulatory Visit: Payer: Self-pay | Admitting: Family Medicine

## 2017-03-11 MED ORDER — HYOSCYAMINE SULFATE ER 0.375 MG PO TB12: 0.3750 mg | ORAL_TABLET | Freq: Two times a day (BID) | ORAL | 2 refills | Status: DC

## 2017-03-11 NOTE — Telephone Encounter (Signed)
I sent in the requested prescription 

## 2017-03-12 ENCOUNTER — Ambulatory Visit (INDEPENDENT_AMBULATORY_CARE_PROVIDER_SITE_OTHER): Payer: Medicare HMO | Admitting: Family Medicine

## 2017-03-12 ENCOUNTER — Telehealth: Payer: Self-pay | Admitting: Family Medicine

## 2017-03-12 VITALS — BP 109/67 | HR 86 | Temp 98.0°F | Ht 66.0 in | Wt 216.0 lb

## 2017-03-12 DIAGNOSIS — Z7901 Long term (current) use of anticoagulants: Secondary | ICD-10-CM

## 2017-03-12 DIAGNOSIS — R319 Hematuria, unspecified: Secondary | ICD-10-CM

## 2017-03-12 DIAGNOSIS — K921 Melena: Secondary | ICD-10-CM | POA: Diagnosis not present

## 2017-03-12 DIAGNOSIS — J811 Chronic pulmonary edema: Secondary | ICD-10-CM | POA: Diagnosis not present

## 2017-03-12 NOTE — Progress Notes (Signed)
   HPI  Patient presents today here with reported hematuria and black stools.  Patient explains that she's had a small amount of blood found on her toilet paper after wiping her urethra. She denies any concern for vaginal bleeding after hysterectomy. She is mostly concerned about her INR given that she recently started omeprazole and is on Coumadin.  No dysuria. No fever, chills, sweats. Right upper quadrant pain previously which is now helped by PPIs.  Patient does state that she's had 2 to 5 loose black sticky stools for the last week or so.  PMH: Smoking status noted ROS: Per HPI  Objective: BP 109/67   Pulse 86   Temp 98 F (36.7 C) (Oral)   Ht 5' 6"  (1.676 m)   Wt 216 lb (98 kg)   BMI 34.86 kg/m  Gen: NAD, alert, cooperative with exam HEENT: NCAT CV: RRR, good S1/S2, no murmur Resp: CTABL, no wheezes, non-labored Ext: No edema, warm Neuro: Alert and oriented, No gross deficits  Assessment and plan:  # Hematuria None seen on a urinalysis tonight, no discrete symptoms of UTI Culture Likely urethral irritation  # Black stools Unclear etiology, however does not sound necessarily frequent enough to be a significant upper GI bleed. Patient has close follow-up with GI coming up in just a few days. CBC Reviewed red flags and reasons to seek [place] care. Continue PPI, patient has not taken any pepto bismuth  Current use of long-term anticoagulation INR is therapeutic tonight, patient is relieved when I explained this. CBC tonight    Orders Placed This Encounter  Procedures  . Urine Culture  . Urinalysis  . CBC with [place], MD [facility] 03/12/2017, 6:43 PM

## 2017-03-12 NOTE — Telephone Encounter (Signed)
Pt aware new rx sent into the pharmacy.

## 2017-03-12 NOTE — Telephone Encounter (Signed)
appt made

## 2017-03-14 LAB — URINALYSIS
Bilirubin, UA: NEGATIVE
Glucose, UA: NEGATIVE
Ketones, UA: NEGATIVE
Nitrite, UA: NEGATIVE
Protein, UA: NEGATIVE
RBC, UA: NEGATIVE
Specific Gravity, UA: 1.02 (ref 1.005–1.030)
Urobilinogen, Ur: 2 mg/dL — ABNORMAL HIGH (ref 0.2–1.0)
pH, UA: 7 (ref 5.0–7.5)

## 2017-03-14 LAB — CBC WITH DIFFERENTIAL/PLATELET
Basophils Absolute: 0 10*3/uL (ref 0.0–0.2)
Basos: 0 %
EOS (ABSOLUTE): 0 10*3/uL (ref 0.0–0.4)
Eos: 0 %
Hematocrit: 38.8 % (ref 34.0–46.6)
Hemoglobin: 12.8 g/dL (ref 11.1–15.9)
Immature Grans (Abs): 0 10*3/uL (ref 0.0–0.1)
Immature Granulocytes: 0 %
Lymphocytes Absolute: 1.7 10*3/uL (ref 0.7–3.1)
Lymphs: 21 %
MCH: 28.4 pg (ref 26.6–33.0)
MCHC: 33 g/dL (ref 31.5–35.7)
MCV: 86 fL (ref 79–97)
Monocytes Absolute: 0.2 10*3/uL (ref 0.1–0.9)
Monocytes: 2 %
Neutrophils Absolute: 6.3 10*3/uL (ref 1.4–7.0)
Neutrophils: 77 %
Platelets: 131 10*3/uL — ABNORMAL LOW (ref 150–379)
RBC: 4.51 x10E6/uL (ref 3.77–5.28)
RDW: 15 % (ref 12.3–15.4)
WBC: 8.2 10*3/uL (ref 3.4–10.8)

## 2017-03-14 LAB — COAGUCHEK XS/INR WAIVED
INR: 2.4 — ABNORMAL HIGH (ref 0.9–1.1)
Prothrombin Time: 28.9 s

## 2017-03-14 LAB — URINE CULTURE

## 2017-03-16 DIAGNOSIS — K754 Autoimmune hepatitis: Secondary | ICD-10-CM | POA: Diagnosis not present

## 2017-03-23 DIAGNOSIS — R1012 Left upper quadrant pain: Secondary | ICD-10-CM | POA: Diagnosis not present

## 2017-03-23 DIAGNOSIS — R195 Other fecal abnormalities: Secondary | ICD-10-CM | POA: Diagnosis not present

## 2017-03-23 DIAGNOSIS — R748 Abnormal levels of other serum enzymes: Secondary | ICD-10-CM | POA: Diagnosis not present

## 2017-04-06 DIAGNOSIS — K754 Autoimmune hepatitis: Secondary | ICD-10-CM | POA: Diagnosis not present

## 2017-04-09 DIAGNOSIS — R195 Other fecal abnormalities: Secondary | ICD-10-CM | POA: Diagnosis not present

## 2017-04-17 ENCOUNTER — Other Ambulatory Visit: Payer: Self-pay | Admitting: Gastroenterology

## 2017-04-17 DIAGNOSIS — K754 Autoimmune hepatitis: Secondary | ICD-10-CM | POA: Diagnosis not present

## 2017-04-17 DIAGNOSIS — R1013 Epigastric pain: Secondary | ICD-10-CM

## 2017-04-17 DIAGNOSIS — K921 Melena: Secondary | ICD-10-CM | POA: Diagnosis not present

## 2017-04-18 DIAGNOSIS — Z1231 Encounter for screening mammogram for malignant neoplasm of breast: Secondary | ICD-10-CM | POA: Diagnosis not present

## 2017-04-19 ENCOUNTER — Ambulatory Visit
Admission: RE | Admit: 2017-04-19 | Discharge: 2017-04-19 | Disposition: A | Discharge status: Home / Self Care | Payer: Medicare HMO | Source: Ambulatory Visit | Attending: Gastroenterology | Admitting: Gastroenterology

## 2017-04-19 DIAGNOSIS — R1013 Epigastric pain: Secondary | ICD-10-CM | POA: Diagnosis not present

## 2017-04-19 MED ORDER — IOPAMIDOL (ISOVUE-300) INJECTION 61%
100.0000 mL | Freq: Once | INTRAVENOUS | Status: AC | PRN
  Administered 2017-04-19: 100 mL via INTRAVENOUS

## 2017-04-27 ENCOUNTER — Ambulatory Visit (INDEPENDENT_AMBULATORY_CARE_PROVIDER_SITE_OTHER): Payer: Medicare HMO | Admitting: Pharmacist Clinician (PhC)/ Clinical Pharmacy Specialist

## 2017-04-27 DIAGNOSIS — I482 Chronic atrial fibrillation, unspecified: Secondary | ICD-10-CM

## 2017-04-27 DIAGNOSIS — Z7901 Long term (current) use of anticoagulants: Secondary | ICD-10-CM | POA: Diagnosis not present

## 2017-04-27 DIAGNOSIS — Z86711 Personal history of pulmonary embolism: Secondary | ICD-10-CM

## 2017-04-27 DIAGNOSIS — J811 Chronic pulmonary edema: Secondary | ICD-10-CM | POA: Diagnosis not present

## 2017-04-27 LAB — COAGUCHEK XS/INR WAIVED
INR: 2.2 — ABNORMAL HIGH (ref 0.9–1.1)
Prothrombin Time: 26.2 s

## 2017-04-27 NOTE — Patient Instructions (Signed)
Anticoagulation Warfarin Dose Instructions as of 04/27/2017      Kimberly Obrien Tue Wed Thu Fri Sat   New Dose 2.5 mg 2.5 mg 2.5 mg 2.5 mg 2.5 mg 2.5 mg 2.5 mg    Description   Continue current warfarin 5mg  tablets - take 1/2 tablet (= 2.5mg ) daily  INR was 2.2 today (goal is 2.0 to 3.0)

## 2017-04-30 ENCOUNTER — Ambulatory Visit (INDEPENDENT_AMBULATORY_CARE_PROVIDER_SITE_OTHER): Payer: Medicare HMO | Admitting: Family Medicine

## 2017-04-30 ENCOUNTER — Encounter: Payer: Self-pay | Admitting: Family Medicine

## 2017-04-30 ENCOUNTER — Other Ambulatory Visit: Payer: Self-pay | Admitting: Family Medicine

## 2017-04-30 ENCOUNTER — Encounter (HOSPITAL_COMMUNITY)
Admission: RE | Admit: 2017-04-30 | Discharge: 2017-04-30 | Disposition: A | Discharge status: Home / Self Care | Payer: Medicare HMO | Source: Ambulatory Visit | Attending: Family Medicine | Admitting: Family Medicine

## 2017-04-30 VITALS — BP 115/64 | HR 96 | Temp 98.0°F | Ht 66.0 in | Wt 220.0 lb

## 2017-04-30 DIAGNOSIS — D508 Other iron deficiency anemias: Secondary | ICD-10-CM

## 2017-04-30 DIAGNOSIS — R6889 Other general symptoms and signs: Secondary | ICD-10-CM | POA: Diagnosis not present

## 2017-04-30 DIAGNOSIS — R1013 Epigastric pain: Secondary | ICD-10-CM | POA: Diagnosis not present

## 2017-04-30 DIAGNOSIS — R42 Dizziness and giddiness: Secondary | ICD-10-CM | POA: Diagnosis not present

## 2017-04-30 DIAGNOSIS — D649 Anemia, unspecified: Secondary | ICD-10-CM | POA: Insufficient documentation

## 2017-04-30 DIAGNOSIS — K922 Gastrointestinal hemorrhage, unspecified: Secondary | ICD-10-CM | POA: Diagnosis not present

## 2017-04-30 LAB — HEMOGLOBIN AND HEMATOCRIT, BLOOD
HCT: 27.1 % — ABNORMAL LOW (ref 36.0–46.0)
Hemoglobin: 7.7 g/dL — ABNORMAL LOW (ref 12.0–15.0)

## 2017-04-30 LAB — PREPARE RBC (CROSSMATCH)

## 2017-04-30 LAB — ABO/RH: ABO/RH(D): AB NEG

## 2017-04-30 LAB — HEMOGLOBIN, FINGERSTICK: Hemoglobin: 7.4 g/dL — ABNORMAL LOW (ref 11.1–15.9)

## 2017-04-30 MED ORDER — LEVOFLOXACIN 500 MG PO TABS: 500.0000 mg | ORAL_TABLET | Freq: Every day | ORAL | 0 refills | Status: DC

## 2017-04-30 NOTE — Progress Notes (Signed)
Subjective:  Patient ID: Kimberly Obrien, female    DOB: 12-15-1960  Age: 56 y.o. MRN: [id number]  CC: Fatigue (pt here today c/o general weakness and dizzy spells, her Hgb was 9 at the GI doctor last time checked and is concerned about this)   HPI Kimberly Obrien presents for Patient states that the dizziness and weakness that has been there for about week to 10 days has made her baseline problems with palpitations worse. The dizziness is a lightheaded feeling like she might pass out. She reports that her stools are dark. She saw GI and had 3 stool tests all of which were positive for blood. She had a hemoglobin drawn there on 8/28 that was 9. A month prior to that it had been 12-1/2. In several weeks to months before that it had been 14. Patient reports that she's having epigastric discomfort. A report from previous endoscopy two years ago was reviewed. This showed evidence for gastritis and diverticulosis. Pt. Reports recent positive test for C. Diff. Denies diarrhea  Depression screen [facility] 2/9 04/30/2017 03/12/2017 03/07/2017  Decreased Interest 0 0 0  Down, Depressed, Hopeless 0 0 0  PHQ - 2 Score 0 0 0  Altered sleeping - - -  Tired, decreased energy - - -  Change in appetite - - -  Feeling bad or failure about yourself  - - -  Trouble concentrating - - -  Moving slowly or fidgety/restless - - -  Suicidal thoughts - - -  PHQ-9 Score - - -    History Kimberly Obrien has a past medical history of Anticoagulation monitoring by pharmacist; Arthritis; Asthma; Autoimmune hepatitis ([place]); Cataract; COPD (chronic obstructive pulmonary disease) ([place]); Hyperlipidemia; and PE (pulmonary embolism).   She has a past surgical history that includes Tubal ligation; Wrist surgery (Right); Cholecystectomy; Spine surgery; Abdominal hysterectomy; Eye surgery; left and right heart catheterization with coronary angiogram (N/A, 05/13/2014); Incontinence surgery (2009); and Cataract extraction, bilateral  (Bilateral).   Her family history includes Cirrhosis in her mother; Heart failure in her father; Hyperlipidemia in her father; Hypertension in her brother; Obesity in her daughter.She reports that she quit smoking about 5 years ago. Her smoking use included Cigarettes. She started smoking about 39 years ago. She has a 100.00 pack-year smoking history. She has never used smokeless tobacco. She reports that she does not drink alcohol or use drugs.    ROS Review of Systems  Constitutional: Positive for fatigue. Negative for chills, diaphoresis and fever.  HENT: Negative for congestion, rhinorrhea and sore throat.   Eyes: Negative for visual disturbance.  Respiratory: Positive for shortness of breath. Negative for cough.   Cardiovascular: Positive for palpitations. Negative for chest pain.  Gastrointestinal: Negative for abdominal pain, diarrhea and nausea.  Genitourinary: Negative for dysuria.  Musculoskeletal: Negative for arthralgias and myalgias.  Neurological: Positive for dizziness and weakness.    Objective:  BP 115/64   Pulse 96   Temp 98 F (36.7 C) (Oral)   Ht '5\' 6"'  (1.676 m)   Wt 220 lb (99.8 kg)   SpO2 98%   BMI 35.51 kg/m   BP Readings from Last 3 Encounters:  04/30/17 115/64  03/12/17 109/67  03/07/17 109/65    Wt Readings from Last 3 Encounters:  04/30/17 220 lb (99.8 kg)  03/12/17 216 lb (98 kg)  03/07/17 215 lb (97.5 kg)     Physical Exam  Constitutional: She is oriented to person, place, and time. She appears well-developed and well-nourished. No  distress.  HENT:  Head: Normocephalic and atraumatic.  Right Ear: External ear normal.  Left Ear: External ear normal.  Nose: Nose normal.  Mouth/Throat: Oropharynx is clear and moist.  Eyes: Pupils are equal, round, and reactive to light. Conjunctivae and EOM are normal.  Neck: Normal range of motion. Neck supple. No thyromegaly present.  Cardiovascular: Normal rate, regular rhythm and normal heart sounds.    No murmur heard. Pulmonary/Chest: Effort normal and breath sounds normal. No respiratory distress. She has no wheezes. She has no rales.  Abdominal: Soft. Bowel sounds are normal. She exhibits no distension. Tenderness: moderate at epigastrum, mild at LLQ.  Musculoskeletal: Normal range of motion.  Lymphadenopathy:    She has no cervical adenopathy.  Neurological: She is alert and oriented to person, place, and time. She has normal reflexes.  Skin: Skin is warm and dry.  Psychiatric: She has a normal mood and affect. Her behavior is normal.      Assessment & Plan:   Kimberly Obrien was seen today for fatigue.  Diagnoses and all orders for this visit:  Dizziness -     Hemoglobin, fingerstick -     CBC with Differential/Platelet -     CMP14+EGFR -     Lipase  Other iron deficiency anemia -     CBC with Differential/Platelet -     CMP14+EGFR -     Lipase -     Anemia Profile B  Epigastric pain  Other orders -     levofloxacin (LEVAQUIN) 500 MG tablet; Take 1 tablet (500 mg total) by mouth daily. For 10 days   Patient has significant blood loss anemia likely from diverticulosis and or gastric bleeding. Either way she needs a transfusion today due to her hemoglobin of 7.4. She has follow-up with her GI physician in 4 days. We'll forward this report to him and see if he wants to see her sooner and/or consider endoscopy.    I am having Kimberly Obrien start on levofloxacin. I am also having her maintain her albuterol, fluticasone, tiotropium, warfarin, Vitamin D (Ergocalciferol), predniSONE, esomeprazole, and hyoscyamine.  Allergies as of 04/30/2017      Reactions   Aciphex [rabeprazole Sodium] Rash   Diflucan [fluconazole] Rash   Lansoprazole Hives   Sucralfate Nausea Only   Metronidazole Other (See Comments)   Dizziness, nausea, dry mouth and some shortness of breath   Vancomycin Diarrhea   Claritin [loratadine] Other (See Comments)   Tired   Crestor [rosuvastatin] Other (See  Comments)   Elevated blood glucose and abdominal pain   Doxycycline Rash   Ibuprofen Other (See Comments)   Upset stomach   Lipitor [atorvastatin] Other (See Comments)   Myalgias, leg pain.  Problems with pancreas, sugars went up also.   Macrobid [nitrofurantoin Macrocrystal] Nausea And Vomiting   Metoprolol Nausea And Vomiting   Nsaids Other (See Comments)   Upset stomach   Omeprazole    bloating   Rabeprazole Rash   Septra [sulfamethoxazole-trimethoprim] Nausea And Vomiting      Medication List       Accurate as of 04/30/17  1:22 PM. Always use your most recent med list.          albuterol 108 (90 Base) MCG/ACT inhaler Commonly known as:  PROVENTIL HFA;VENTOLIN HFA Inhale into the lungs every 6 (six) hours as needed for wheezing or shortness of breath.   esomeprazole 40 MG capsule Commonly known as:  NEXIUM Take 1 capsule (40 mg total) by mouth daily.  fluticasone 50 MCG/ACT nasal spray Commonly known as:  FLONASE Place 2 sprays into both nostrils daily.   hyoscyamine 0.375 MG 12 hr tablet Commonly known as:  LEVBID Take 1 tablet (0.375 mg total) by mouth 2 (two) times daily.   levofloxacin 500 MG tablet Commonly known as:  LEVAQUIN Take 1 tablet (500 mg total) by mouth daily. For 10 days   predniSONE 10 MG tablet Commonly known as:  DELTASONE Take 10 mg by mouth daily.   tiotropium 18 MCG inhalation capsule Commonly known as:  SPIRIVA Place 18 mcg into inhaler and inhale daily.   Vitamin D (Ergocalciferol) 50000 units Caps capsule Commonly known as:  DRISDOL Take 1 capsule (50,000 Units total) by mouth every 7 (seven) days.   warfarin 5 MG tablet Commonly known as:  COUMADIN TAKE ONE-HALF TO ONE TABLET BY MOUTH ONCE DAILY            Discharge Care Instructions        Start     Ordered   04/30/17 0000  Hemoglobin, fingerstick     04/30/17 1117   04/30/17 0000  CBC with Differential/Platelet     04/30/17 1142   04/30/17 0000  CMP14+EGFR      Question:  Has the patient fasted?  Answer:  Yes   04/30/17 1142   04/30/17 0000  Lipase     04/30/17 1142   04/30/17 0000  levofloxacin (LEVAQUIN) 500 MG tablet  Daily     04/30/17 1142   04/30/17 0000  Anemia Profile B     04/30/17 1143       Follow-up: Return in about 1 week (around 05/07/2017).  Claretta Fraise, M.D.

## 2017-04-30 NOTE — Progress Notes (Signed)
Results for Kimberly Obrien, Kimberly Obrien (MRN [id number]) as of 04/30/2017 14:15  Ref. Range 04/30/2017 11:23  Hemoglobin Latest Ref Range: 11.1 - 15.9 g/dL 7.4 (L)

## 2017-04-30 NOTE — Telephone Encounter (Signed)
Pharmacy wanted to make sure patient was going to be followed closely with her protime. Pharmacy aware patient is being seen again next week.

## 2017-04-30 NOTE — Progress Notes (Signed)
Blood drawn and sent to lab for type and crossmatch and H/H. For transfusion in AM.

## 2017-05-01 ENCOUNTER — Encounter (HOSPITAL_COMMUNITY)
Admission: RE | Admit: 2017-05-01 | Discharge: 2017-05-01 | Disposition: A | Discharge status: Home / Self Care | Payer: Medicare HMO | Source: Ambulatory Visit | Attending: Family Medicine | Admitting: Family Medicine

## 2017-05-01 DIAGNOSIS — D649 Anemia, unspecified: Secondary | ICD-10-CM | POA: Diagnosis not present

## 2017-05-01 DIAGNOSIS — K922 Gastrointestinal hemorrhage, unspecified: Secondary | ICD-10-CM | POA: Diagnosis not present

## 2017-05-01 LAB — ANEMIA PROFILE B
Basophils Absolute: 0 10*3/uL (ref 0.0–0.2)
Basos: 0 %
EOS (ABSOLUTE): 0 10*3/uL (ref 0.0–0.4)
Eos: 1 %
Ferritin: 12 ng/mL — ABNORMAL LOW (ref 15–150)
Folate: 3.2 ng/mL (ref >3.0)
Hematocrit: 24.8 % — ABNORMAL LOW (ref 34.0–46.6)
Hemoglobin: 7.7 g/dL — CL (ref 11.1–15.9)
Immature Grans (Abs): 0 10*3/uL (ref 0.0–0.1)
Immature Granulocytes: 0 %
Iron Saturation: 7 % — CL (ref 15–55)
Iron: 29 ug/dL (ref 27–159)
Lymphocytes Absolute: 1.7 10*3/uL (ref 0.7–3.1)
Lymphs: 20 %
MCH: 22.6 pg — ABNORMAL LOW (ref 26.6–33.0)
MCHC: 31 g/dL — ABNORMAL LOW (ref 31.5–35.7)
MCV: 73 fL — ABNORMAL LOW (ref 79–97)
Monocytes Absolute: 0.5 10*3/uL (ref 0.1–0.9)
Monocytes: 6 %
Neutrophils Absolute: 6.1 10*3/uL (ref 1.4–7.0)
Neutrophils: 73 %
Platelets: 202 10*3/uL (ref 150–379)
RBC: 3.4 x10E6/uL — ABNORMAL LOW (ref 3.77–5.28)
RDW: 16.5 % — ABNORMAL HIGH (ref 12.3–15.4)
Retic Ct Pct: 3.9 % — ABNORMAL HIGH (ref 0.6–2.6)
Total Iron Binding Capacity: 419 ug/dL (ref 250–450)
UIBC: 390 ug/dL (ref 131–425)
Vitamin B-12: 316 pg/mL (ref 232–1245)
WBC: 8.4 10*3/uL (ref 3.4–10.8)

## 2017-05-01 LAB — CMP14+EGFR
ALT: 46 IU/L — ABNORMAL HIGH (ref 0–32)
AST: 51 IU/L — ABNORMAL HIGH (ref 0–40)
Albumin/Globulin Ratio: 1.7 (ref 1.2–2.2)
Albumin: 3.5 g/dL (ref 3.5–5.5)
Alkaline Phosphatase: 89 IU/L (ref 39–117)
BUN/Creatinine Ratio: 16 (ref 9–23)
BUN: 14 mg/dL (ref 6–24)
Bilirubin Total: 0.5 mg/dL (ref 0.0–1.2)
CO2: 26 mmol/L (ref 20–29)
Calcium: 8.5 mg/dL — ABNORMAL LOW (ref 8.7–10.2)
Chloride: 104 mmol/L (ref 96–106)
Creatinine, Ser: 0.87 mg/dL (ref 0.57–1.00)
GFR calc Af Amer: 86 mL/min/{1.73_m2} (ref >59)
GFR calc non Af Amer: 75 mL/min/{1.73_m2} (ref >59)
Globulin, Total: 2.1 g/dL (ref 1.5–4.5)
Glucose: 115 mg/dL — ABNORMAL HIGH (ref 65–99)
Potassium: 3.7 mmol/L (ref 3.5–5.2)
Sodium: 142 mmol/L (ref 134–144)
Total Protein: 5.6 g/dL — ABNORMAL LOW (ref 6.0–8.5)

## 2017-05-01 LAB — LIPASE: Lipase: 85 U/L — ABNORMAL HIGH (ref 14–72)

## 2017-05-01 MED ORDER — SODIUM CHLORIDE 0.9% FLUSH
INTRAVENOUS | Status: AC
  Filled 2017-05-01: qty 10

## 2017-05-01 MED ORDER — SODIUM CHLORIDE 0.9 % IV SOLN: Freq: Once | INTRAVENOUS | Status: DC

## 2017-05-02 ENCOUNTER — Other Ambulatory Visit: Payer: Self-pay | Admitting: *Deleted

## 2017-05-02 LAB — TYPE AND SCREEN
ABO/RH(D): AB NEG
Antibody Screen: NEGATIVE
Unit division: 0
Unit division: 0

## 2017-05-02 LAB — BPAM RBC
Blood Product Expiration Date: 201809122359
Blood Product Expiration Date: 201809182359
ISSUE DATE / TIME: 201809110802
ISSUE DATE / TIME: 201809110939
Unit Type and Rh: 1700
Unit Type and Rh: 1700

## 2017-05-02 MED ORDER — FERROUS FUMARATE 325 (106 FE) MG PO TABS: 1.0000 | ORAL_TABLET | Freq: Two times a day (BID) | ORAL | 1 refills | Status: DC

## 2017-05-03 ENCOUNTER — Other Ambulatory Visit: Payer: Self-pay | Admitting: Gastroenterology

## 2017-05-07 ENCOUNTER — Ambulatory Visit (HOSPITAL_COMMUNITY): Payer: Medicare HMO | Admitting: Anesthesiology

## 2017-05-07 ENCOUNTER — Ambulatory Visit: Payer: Medicare HMO | Admitting: Family Medicine

## 2017-05-07 ENCOUNTER — Encounter (HOSPITAL_COMMUNITY): Payer: Self-pay

## 2017-05-07 ENCOUNTER — Ambulatory Visit (HOSPITAL_COMMUNITY): Admit: 2017-05-07 | Payer: Medicare HMO | Admitting: Gastroenterology

## 2017-05-07 ENCOUNTER — Encounter (HOSPITAL_COMMUNITY)
Admission: RE | Disposition: A | Discharge status: Home / Self Care | Payer: Self-pay | Source: Ambulatory Visit | Attending: Gastroenterology

## 2017-05-07 ENCOUNTER — Ambulatory Visit (HOSPITAL_COMMUNITY)
Admission: RE | Admit: 2017-05-07 | Discharge: 2017-05-07 | Disposition: A | Discharge status: Home / Self Care | Payer: Medicare HMO | Source: Ambulatory Visit | Attending: Gastroenterology | Admitting: Gastroenterology

## 2017-05-07 DIAGNOSIS — K3189 Other diseases of stomach and duodenum: Secondary | ICD-10-CM | POA: Diagnosis not present

## 2017-05-07 DIAGNOSIS — Z8249 Family history of ischemic heart disease and other diseases of the circulatory system: Secondary | ICD-10-CM | POA: Insufficient documentation

## 2017-05-07 DIAGNOSIS — Z981 Arthrodesis status: Secondary | ICD-10-CM | POA: Insufficient documentation

## 2017-05-07 DIAGNOSIS — K760 Fatty (change of) liver, not elsewhere classified: Secondary | ICD-10-CM | POA: Insufficient documentation

## 2017-05-07 DIAGNOSIS — Z6835 Body mass index (BMI) 35.0-35.9, adult: Secondary | ICD-10-CM | POA: Insufficient documentation

## 2017-05-07 DIAGNOSIS — K921 Melena: Secondary | ICD-10-CM | POA: Insufficient documentation

## 2017-05-07 DIAGNOSIS — K589 Irritable bowel syndrome without diarrhea: Secondary | ICD-10-CM | POA: Diagnosis not present

## 2017-05-07 DIAGNOSIS — M1990 Unspecified osteoarthritis, unspecified site: Secondary | ICD-10-CM | POA: Insufficient documentation

## 2017-05-07 DIAGNOSIS — Z90710 Acquired absence of both cervix and uterus: Secondary | ICD-10-CM | POA: Insufficient documentation

## 2017-05-07 DIAGNOSIS — Z87891 Personal history of nicotine dependence: Secondary | ICD-10-CM | POA: Diagnosis not present

## 2017-05-07 DIAGNOSIS — K640 First degree hemorrhoids: Secondary | ICD-10-CM | POA: Insufficient documentation

## 2017-05-07 DIAGNOSIS — Z79899 Other long term (current) drug therapy: Secondary | ICD-10-CM | POA: Insufficient documentation

## 2017-05-07 DIAGNOSIS — E785 Hyperlipidemia, unspecified: Secondary | ICD-10-CM | POA: Diagnosis not present

## 2017-05-07 DIAGNOSIS — Z7901 Long term (current) use of anticoagulants: Secondary | ICD-10-CM | POA: Insufficient documentation

## 2017-05-07 DIAGNOSIS — Z888 Allergy status to other drugs, medicaments and biological substances status: Secondary | ICD-10-CM | POA: Insufficient documentation

## 2017-05-07 DIAGNOSIS — I272 Pulmonary hypertension, unspecified: Secondary | ICD-10-CM | POA: Insufficient documentation

## 2017-05-07 DIAGNOSIS — Z8619 Personal history of other infectious and parasitic diseases: Secondary | ICD-10-CM | POA: Insufficient documentation

## 2017-05-07 DIAGNOSIS — J449 Chronic obstructive pulmonary disease, unspecified: Secondary | ICD-10-CM | POA: Diagnosis not present

## 2017-05-07 DIAGNOSIS — Z8379 Family history of other diseases of the digestive system: Secondary | ICD-10-CM | POA: Insufficient documentation

## 2017-05-07 DIAGNOSIS — Z9049 Acquired absence of other specified parts of digestive tract: Secondary | ICD-10-CM | POA: Insufficient documentation

## 2017-05-07 DIAGNOSIS — Z881 Allergy status to other antibiotic agents status: Secondary | ICD-10-CM | POA: Insufficient documentation

## 2017-05-07 DIAGNOSIS — D124 Benign neoplasm of descending colon: Secondary | ICD-10-CM | POA: Insufficient documentation

## 2017-05-07 DIAGNOSIS — R000 Tachycardia, unspecified: Secondary | ICD-10-CM | POA: Diagnosis not present

## 2017-05-07 DIAGNOSIS — Z86711 Personal history of pulmonary embolism: Secondary | ICD-10-CM | POA: Diagnosis not present

## 2017-05-07 DIAGNOSIS — G4700 Insomnia, unspecified: Secondary | ICD-10-CM | POA: Insufficient documentation

## 2017-05-07 DIAGNOSIS — K319 Disease of stomach and duodenum, unspecified: Secondary | ICD-10-CM | POA: Insufficient documentation

## 2017-05-07 DIAGNOSIS — F411 Generalized anxiety disorder: Secondary | ICD-10-CM | POA: Diagnosis not present

## 2017-05-07 DIAGNOSIS — I482 Chronic atrial fibrillation: Secondary | ICD-10-CM | POA: Diagnosis not present

## 2017-05-07 DIAGNOSIS — D120 Benign neoplasm of cecum: Secondary | ICD-10-CM | POA: Diagnosis not present

## 2017-05-07 DIAGNOSIS — E559 Vitamin D deficiency, unspecified: Secondary | ICD-10-CM | POA: Insufficient documentation

## 2017-05-07 DIAGNOSIS — K2970 Gastritis, unspecified, without bleeding: Secondary | ICD-10-CM | POA: Diagnosis not present

## 2017-05-07 HISTORY — PX: COLONOSCOPY WITH PROPOFOL: SHX5780

## 2017-05-07 HISTORY — PX: ESOPHAGOGASTRODUODENOSCOPY (EGD) WITH PROPOFOL: SHX5813

## 2017-05-07 SURGERY — ESOPHAGOGASTRODUODENOSCOPY (EGD) WITH PROPOFOL
Anesthesia: Monitor Anesthesia Care

## 2017-05-07 SURGERY — EGD (ESOPHAGOGASTRODUODENOSCOPY)
Anesthesia: Monitor Anesthesia Care

## 2017-05-07 MED ORDER — LIDOCAINE 2% (20 MG/ML) 5 ML SYRINGE
INTRAMUSCULAR | Status: AC
  Filled 2017-05-07: qty 5

## 2017-05-07 MED ORDER — LACTATED RINGERS IV SOLN
INTRAVENOUS | Status: DC
  Administered 2017-05-07: 1000 mL via INTRAVENOUS

## 2017-05-07 MED ORDER — PROPOFOL 500 MG/50ML IV EMUL
INTRAVENOUS | Status: DC | PRN
  Administered 2017-05-07: 200 ug/kg/min via INTRAVENOUS

## 2017-05-07 MED ORDER — PROPOFOL 10 MG/ML IV BOLUS
INTRAVENOUS | Status: AC
  Filled 2017-05-07: qty 20

## 2017-05-07 MED ORDER — SODIUM CHLORIDE 0.9 % IV SOLN
INTRAVENOUS | Status: DC
Start: 2017-05-07 — End: 2017-05-07

## 2017-05-07 MED ORDER — ONDANSETRON HCL 4 MG/2ML IJ SOLN
INTRAMUSCULAR | Status: DC | PRN
  Administered 2017-05-07: 4 mg via INTRAVENOUS

## 2017-05-07 MED ORDER — PROPOFOL 10 MG/ML IV BOLUS
INTRAVENOUS | Status: DC | PRN
  Administered 2017-05-07: 10 mg via INTRAVENOUS

## 2017-05-07 MED ORDER — PROPOFOL 10 MG/ML IV BOLUS
INTRAVENOUS | Status: AC
  Filled 2017-05-07: qty 40

## 2017-05-07 MED ORDER — LIDOCAINE 2% (20 MG/ML) 5 ML SYRINGE
INTRAMUSCULAR | Status: DC | PRN
  Administered 2017-05-07: 100 mg via INTRAVENOUS

## 2017-05-07 MED ORDER — ONDANSETRON HCL 4 MG/2ML IJ SOLN
INTRAMUSCULAR | Status: AC
  Filled 2017-05-07: qty 2

## 2017-05-07 SURGICAL SUPPLY — 24 items

## 2017-05-07 NOTE — Transfer of Care (Signed)
Immediate Anesthesia Transfer of Care Note  Patient: Kimberly Obrien  Procedure(s) Performed: Procedure(s): ESOPHAGOGASTRODUODENOSCOPY (EGD) WITH PROPOFOL (N/A) COLONOSCOPY WITH PROPOFOL (N/A)  Patient Location: PACU  Anesthesia Type:MAC  Level of Consciousness:  sedated, patient cooperative and responds to stimulation  Airway & Oxygen Therapy:Patient Spontanous Breathing and Patient connected to face mask oxgen  Post-op Assessment:  Report given to PACU RN and Post -op Vital signs reviewed and stable  Post vital signs:  Reviewed and stable  Last Vitals:  Vitals:   05/07/17 1007 05/07/17 1009  BP:    Pulse: 79   Resp: 13   Temp:  36.7 C  SpO2: 32%     Complications: No apparent anesthesia complications

## 2017-05-07 NOTE — Op Note (Signed)
[facility] Patient Name: Kimberly Obrien Procedure Date: 05/07/2017 MRN: [id number] Attending MD: Nancy Fetter Dr., MD Date of Birth: [birth year] CSN: [id number] Age: 56 Admit Type: Outpatient Procedure:                Colonoscopy Indications:              Melena Providers:                Jeneen Rinks L. Orlandus Borowski Dr., MD, Cleda Daub, RN,                            Alan Mulder, Technician Referring MD:              Medicines:                Monitored Anesthesia Care Complications:            No immediate complications. Estimated Blood Loss:     Estimated blood loss was minimal. Procedure:                Pre-Anesthesia Assessment:                           - Prior to the procedure, a History and Physical                            was performed, and patient medications and                            allergies were reviewed. The patient's tolerance of                            previous anesthesia was also reviewed. The risks                            and benefits of the procedure and the sedation                            options and risks were discussed with the patient.                            All questions were answered, and informed consent                            was obtained. Prior Anticoagulants: The patient has                            taken Coumadin (warfarin), last dose was 7 days                            prior to procedure. ASA Grade Assessment: IV - A                            patient with severe systemic disease that is a  constant threat to life. After reviewing the risks                            and benefits, the patient was deemed in                            satisfactory condition to undergo the procedure.                           After obtaining informed consent, the colonoscope                            was passed under direct vision. Throughout the                            procedure, the patient's blood  pressure, pulse, and                            oxygen saturations were monitored continuously. The                            EC-[id number] ([id number]) scope was introduced through                            the anus and advanced to the the cecum, identified                            by appendiceal orifice and ileocecal valve. The                            colonoscopy was performed without difficulty. The                            patient tolerated the procedure well. The quality                            of the bowel preparation was good. The ileocecal                            valve, appendiceal orifice, and rectum were                            photographed. Scope In: 9:59:30 AM Scope Out: 10:03:37 AM Total Procedure Duration: 0 hours 4 minutes 7 seconds  Findings:      The perianal and digital rectal examinations were normal.      Two sessile polyps were found in the cecum. The polyps were 3 to 4 mm in       size. These polyps were removed with a hot snare. Resection and       retrieval were complete. The pathology specimen was placed into Bottle       Number 1.      Two sessile polyps were found in the descending colon. The polyps were 4       to 7 mm in size. These polyps  were removed with a hot snare. Resection       and retrieval were complete. The pathology specimen was placed into       Bottle Number 2.      Non-bleeding internal hemorrhoids were found during retroflexion. The       hemorrhoids were small and Grade I (internal hemorrhoids that do not       prolapse). Impression:               - Two 3 to 4 mm polyps in the cecum, removed with a                            hot snare. Resected and retrieved.                           - Two 4 to 7 mm polyps in the descending colon,                            removed with a hot snare. Resected and retrieved.                           - Non-bleeding internal hemorrhoids. Moderate Sedation:      N/A- Per Anesthesia  Care Recommendation:           - Patient has a contact number available for                            emergencies. The signs and symptoms of potential                            delayed complications were discussed with the                            patient. Return to normal activities tomorrow.                            Written discharge instructions were provided to the                            patient.                           - Resume previous diet.                           - Resume Coumadin (warfarin) at prior dose in 5                            days.                           - No aspirin, ibuprofen, naproxen, or other                            non-steroidal anti-inflammatory drugs for 5 days  after polyp removal.                           - Repeat colonoscopy for surveillance based on                            pathology results.                           - Return to endoscopist in 3 weeks. Procedure Code(s):        --- Professional ---                           [phone], Colonoscopy, flexible; with removal of                            tumor(s), polyp(s), or other lesion(s) by snare                            technique Diagnosis Code(s):        --- Professional ---                           D12.0, Benign neoplasm of cecum                           D12.4, Benign neoplasm of descending colon                           K64.0, First degree hemorrhoids                           K92.1, Melena (includes Hematochezia) CPT copyright 2016 American Medical Association. All rights reserved. The codes documented in this report are preliminary and upon coder review may  be revised to meet current compliance requirements. Nancy Fetter Dr., MD 05/07/2017 10:16:31 AM This report has been signed electronically. Number of Addenda: 0

## 2017-05-07 NOTE — H&P (Signed)
Subjective:   Patient is a 56 y.o. female presents with heme positive stools and bleeding. She has a history of autoimmune hepatitis but has been treated with prednisone recently. She has not been on immunomodulators did not tolerate 6 MP very well. She has been on Coumadin because of a history of pulmonary embolus in the past Mrs. Been on hold now for about a week. She's had vague epigastric pain. Her stools were positive for blood. Due to the fact that she had positive stools and someday pain EGD and colonoscopies to be performed. This is been discussed with her in detail in the office.. Procedure including risks and benefits discussed in office.  Patient Active Problem List   Diagnosis Date Noted  . RUQ pain 01/30/2017  . Current use of long term anticoagulation 01/30/2017  . Enterocolitis 10/17/2016  . COPD (chronic obstructive pulmonary disease) ([place]) 09/23/2015  . Autoimmune hepatitis ([place]) 09/23/2015  . IBS (irritable bowel syndrome) 07/19/2015  . Chronic atrial fibrillation ([place]) 01/11/2015  . Tachycardia 12/04/2013  . Fatty liver 05/25/2013  . Vitamin D deficiency 02/04/2013  . History of pulmonary embolism 02/04/2013  . Other fatigue 02/04/2013  . Chronic arthralgias of knees and hips 02/04/2013  . Insomnia 02/04/2013  . GAD (generalized anxiety disorder) 02/04/2013  . HLD (hyperlipidemia) 02/04/2013  . Chronic pulmonary edema 11/21/2012   Past Medical History:  Diagnosis Date  . Anticoagulation monitoring by pharmacist   . Arthritis   . Asthma   . Autoimmune hepatitis ([place])   . Cataract   . COPD (chronic obstructive pulmonary disease) ([place])   . Hyperlipidemia   . PE (pulmonary embolism)     Past Surgical History:  Procedure Laterality Date  . ABDOMINAL HYSTERECTOMY    . CATARACT EXTRACTION, BILATERAL Bilateral   . CHOLECYSTECTOMY    . EYE SURGERY     Cataracts  . INCONTINENCE SURGERY  2009  . LEFT AND RIGHT HEART CATHETERIZATION WITH CORONARY ANGIOGRAM N/A  05/13/2014   Procedure: LEFT AND RIGHT HEART CATHETERIZATION WITH CORONARY ANGIOGRAM;  Surgeon: [name], MD;  Location: [facility] CATH LAB;  Service: Cardiovascular;  Laterality: N/A;  . SPINE SURGERY     L5 S1 fusion  . TUBAL LIGATION    . WRIST SURGERY Right    Had surgery twice to shave bone for blood circulation improvement.    Prescriptions Prior to Admission  Medication Sig Dispense Refill Last Dose  . acetaminophen (TYLENOL) 500 MG tablet Take 1,000 mg by mouth every 6 (six) hours as needed (for pain/headaches.).   Past Week at Unknown time  . albuterol (PROVENTIL HFA;VENTOLIN HFA) 108 (90 BASE) MCG/ACT inhaler Inhale into the lungs every 6 (six) hours as needed for wheezing or shortness of breath.   Past Week at Unknown time  . esomeprazole (NEXIUM) 20 MG capsule Take 20 mg by mouth 2 (two) times daily.   Past Week at Unknown time  . fluticasone (FLONASE) 50 MCG/ACT nasal spray Place 2 sprays into both nostrils daily. (Patient taking differently: Place 2 sprays into both nostrils daily as needed (for nasal congestion/sinus issues.). ) 16 g 6 Past Week at Unknown time  . ibuprofen (ADVIL,MOTRIN) 200 MG tablet Take 200 mg by mouth every 8 (eight) hours as needed (for pain/severe headaches.).   Past Week at Unknown time  . levofloxacin (LEVAQUIN) 500 MG tablet Take 1 tablet (500 mg total) by mouth daily. For 10 days 10 tablet 0 Past Week at Unknown time  . Polyethylene Glycol 400 (BLINK  TEARS) 0.25 % SOLN Place 1-2 drops into both eyes 3 (three) times daily as needed (for sinus irritation/dry eyes.).   Past Week at Unknown time  . predniSONE (DELTASONE) 10 MG tablet Take 10 mg by mouth daily with breakfast.    Past Week at Unknown time  . warfarin (COUMADIN) 5 MG tablet TAKE ONE-HALF TO ONE TABLET BY MOUTH ONCE DAILY (Patient taking differently: TAKE ONE-HALF BY MOUTH ONCE DAILY) 50 tablet 2 04/29/2017 at Unknown time  . esomeprazole (NEXIUM) 40 MG capsule Take 1 capsule (40 mg total) by  mouth daily. (Patient not taking: Reported on 05/03/2017) 30 capsule 3 Not Taking at Unknown time  . ferrous fumarate (HEMOCYTE - 106 MG FE) 325 (106 Fe) MG TABS tablet Take 1 tablet (106 mg of iron total) by mouth 2 (two) times daily. 180 each 1   . hyoscyamine (LEVBID) 0.375 MG 12 hr tablet Take 1 tablet (0.375 mg total) by mouth 2 (two) times daily. (Patient not taking: Reported on 05/03/2017) 60 tablet 2 Not Taking at Unknown time  . Vitamin D, Ergocalciferol, (DRISDOL) 50000 units CAPS capsule Take 1 capsule (50,000 Units total) by mouth every 7 (seven) days. (Patient not taking: Reported on 05/03/2017) 12 capsule 1 Not Taking at Unknown time   Allergies  Allergen Reactions  . Aciphex [Rabeprazole Sodium] Rash  . Diflucan [Fluconazole] Rash  . Lansoprazole Hives  . Sucralfate Nausea Only  . Metronidazole Other (See Comments)    Dizziness, nausea, dry mouth and some shortness of breath  . Vancomycin Diarrhea  . Claritin [Loratadine] Other (See Comments)    Tired   . Crestor [Rosuvastatin] Other (See Comments)    Elevated blood glucose and abdominal pain  . Doxycycline Rash  . Ibuprofen Other (See Comments)    Upset stomach  . Lipitor [Atorvastatin] Other (See Comments)    Myalgias, leg pain.  Problems with pancreas, sugars went up also.  Santiago Bur [Nitrofurantoin Macrocrystal] Nausea And Vomiting  . Metoprolol Nausea And Vomiting  . Nsaids Other (See Comments)    Upset stomach  . Omeprazole     bloating  . Rabeprazole Rash  . Septra [Sulfamethoxazole-Trimethoprim] Nausea And Vomiting    Social History  Substance Use Topics  . Smoking status: Former Smoker    Packs/day: 2.00    Years: 50.00    Types: Cigarettes    Start date: 08/22/1977    Quit date: 04/21/2012  . Smokeless tobacco: Never Used  . Alcohol use No    Family History  Problem Relation Age of Onset  . Cirrhosis Mother   . Hyperlipidemia Father   . Heart failure Father   . Obesity Daughter   . Hypertension  Brother   . Heart attack Neg Hx      Objective:   Patient Vitals for the past 8 hrs:  BP Temp Temp src Pulse Resp SpO2 Height Weight  05/07/17 0823 (!) 103/47 98 F (36.7 C) Oral 82 13 97 % 5\' 6"  (1.676 m) 99.8 kg (220 lb)   No intake/output data recorded. No intake/output data recorded.   See MD Preop evaluation      Assessment:   1. Blood in stool 2. History of pulmonary embolus. Has been anticoagulated has been off Coumadin now for about a week. 3. Autoimmune hepatitis has been in remission low-dose prednisone  Plan:   We will proceed with EGD colonoscopy to further evaluate her keen positive stool

## 2017-05-07 NOTE — Anesthesia Postprocedure Evaluation (Signed)
Anesthesia Post Note  Patient: Kimberly Obrien  Procedure(s) Performed: Procedure(s) (LRB): ESOPHAGOGASTRODUODENOSCOPY (EGD) WITH PROPOFOL (N/A) COLONOSCOPY WITH PROPOFOL (N/A)     Patient location during evaluation: Endoscopy Anesthesia Type: MAC Level of consciousness: awake and alert Pain management: pain level controlled Vital Signs Assessment: post-procedure vital signs reviewed and stable Respiratory status: spontaneous breathing, nonlabored ventilation, respiratory function stable and patient connected to nasal cannula oxygen Cardiovascular status: stable and blood pressure returned to baseline Postop Assessment: no apparent nausea or vomiting Anesthetic complications: no    Last Vitals:  Vitals:   05/07/17 1040 05/07/17 1043  BP: (!) 111/27 (!) 117/49  Pulse: 74 73  Resp: 15 18  Temp:    SpO2: 94% 94%    Last Pain:  Vitals:   05/07/17 1009  TempSrc: Oral  PainSc:                  [name],JAMES TERRILL

## 2017-05-07 NOTE — Op Note (Signed)
[facility] Patient Name: Kimberly Obrien Procedure Date: 05/07/2017 MRN: [id number] Attending MD: Nancy Fetter Dr., MD Date of Birth: [birth year] CSN: [id number] Age: 57 Admit Type: Outpatient Procedure:                Upper GI endoscopy Indications:              Melena Providers:                Jeneen Rinks L. Marqueta Pulley Dr., MD, Cleda Daub, RN,                            Alan Mulder, Technician Referring MD:              Medicines:                Monitored Anesthesia Care Complications:            No immediate complications. Estimated Blood Loss:     Estimated blood loss: none. Procedure:                Pre-Anesthesia Assessment:                           - Prior to the procedure, a History and Physical                            was performed, and patient medications and                            allergies were reviewed. The patient's tolerance of                            previous anesthesia was also reviewed. The risks                            and benefits of the procedure and the sedation                            options and risks were discussed with the patient.                            All questions were answered, and informed consent                            was obtained. Prior Anticoagulants: The patient has                            taken Coumadin (warfarin), last dose was 7 days                            prior to procedure. ASA Grade Assessment: IV - A                            patient with severe systemic disease that is a  constant threat to life. After reviewing the risks                            and benefits, the patient was deemed in                            satisfactory condition to undergo the procedure.                           After obtaining informed consent, the endoscope was                            passed under direct vision. Throughout the                            procedure, the patient's blood  pressure, pulse, and                            oxygen saturations were monitored continuously. The                            [id number] ([id number]) scope was introduced through the                            mouth, and advanced to the second part of duodenum.                            The upper GI endoscopy was accomplished without                            difficulty. The patient tolerated the procedure                            well. Scope In: Scope Out: Findings:      There is no endoscopic evidence of Barrett's esophagus, esophagitis,       stricture or varices in the entire esophagus.      Localized moderate inflammation characterized by congestion (edema),       erythema and friability was found in the gastric antrum. Biopsies were       taken with a cold forceps for histology.      The examined duodenum was normal. Impression:               - Gastritis. Biopsied.                           - Normal examined duodenum.                           - Blood found in stool Moderate Sedation:      N/A- Per Anesthesia Care Recommendation:           - Patient has a contact number available for                            emergencies. The signs and symptoms of  potential                            delayed complications were discussed with the                            patient. Return to normal activities tomorrow.                            Written discharge instructions were provided to the                            patient.                           - Resume previous diet.                           - Continue present medications.                           - Resume Coumadin (warfarin) at prior dose in 5                            days.                           - Return to endoscopist in 3 weeks. Procedure Code(s):        --- Professional ---                           [phone], Esophagogastroduodenoscopy, flexible,                            transoral; with biopsy, single or  multiple Diagnosis Code(s):        --- Professional ---                           K29.70, Gastritis, unspecified, without bleeding                           K92.1, Melena (includes Hematochezia) CPT copyright 2016 American [employer] Association. All rights reserved. The codes documented in this report are preliminary and upon coder review may  be revised to meet current compliance requirements. Nancy Fetter Dr., MD 05/07/2017 10:12:24 AM This report has been signed electronically. Number of Addenda: 0

## 2017-05-07 NOTE — Discharge Instructions (Signed)
YOU HAD AN ENDOSCOPIC PROCEDURE TODAY: Refer to the procedure report and other information in the discharge instructions given to you for any specific questions about what was found during the examination. If this information does not answer your questions, please call [facility] office at [phone] to clarify.   YOU SHOULD EXPECT: Some feelings of bloating in the abdomen. Passage of more gas than usual. Walking can help get rid of the air that was put into your GI tract during the procedure and reduce the bloating. If you had a lower endoscopy (such as a colonoscopy or flexible sigmoidoscopy) you may notice spotting of blood in your stool or on the toilet paper. Some abdominal soreness may be present for a day or two, also.  DIET: Your first meal following the procedure should be a light meal and then it is ok to progress to your normal diet. A half-sandwich or bowl of soup is an example of a good first meal. Heavy or fried foods are harder to digest and may make you feel nauseous or bloated. Drink plenty of fluids but you should avoid alcoholic beverages for 24 hours. If you had a esophageal dilation, please see attached instructions for diet.   ACTIVITY: Your care partner should take you home directly after the procedure. You should plan to take it easy, moving slowly for the rest of the day. You can resume normal activity the day after the procedure however YOU SHOULD NOT DRIVE, use power tools, machinery or perform tasks that involve climbing or major physical exertion for 24 hours (because of the sedation medicines used during the test).   SYMPTOMS TO REPORT IMMEDIATELY: A gastroenterologist can be reached at any hour. Please call [phone]  for any of the following symptoms:  Following lower endoscopy (colonoscopy, flexible sigmoidoscopy) Excessive amounts of blood in the stool  Significant tenderness, worsening of abdominal pains  Swelling of the abdomen that is new, acute  Fever of 100 or  higher  Following upper endoscopy (EGD, EUS, ERCP, esophageal dilation) Vomiting of blood or coffee ground material  New, significant abdominal pain  New, significant chest pain or pain under the shoulder blades  Painful or persistently difficult swallowing  New shortness of breath  Black, tarry-looking or red, bloody stools  FOLLOW UP:  If any biopsies were taken you will be contacted by phone or by letter within the next 1-3 weeks. Call [phone]  if you have not heard about the biopsies in 3 weeks.  Please also call with any specific questions about appointments or follow up tests.   No aspirin, ibuprofen or other NSAID medications for 5 days. Hold coumadin for 5 days then resume the previous dose. Office will send note or call when pathology results are obtained. Colonoscopy will be repeated based on the pathology results.

## 2017-05-07 NOTE — Anesthesia Preprocedure Evaluation (Addendum)
Anesthesia Evaluation  Patient identified by MRN, date of birth, ID band Patient awake    Reviewed: Allergy & Precautions, NPO status , Patient's Chart, lab work & pertinent test results  Airway Mallampati: III  TM Distance: <3 FB Neck ROM: Full    Dental  (+) Edentulous Upper, Poor Dentition, Missing, Dental Advisory Given   Pulmonary shortness of breath, asthma , COPD, former smoker,  Hx of pulm embolism   breath sounds clear to auscultation       Cardiovascular  Rhythm:Regular Rate:Normal  Pulm HTN   Neuro/Psych    GI/Hepatic   Endo/Other  Morbid obesity  Renal/GU      Musculoskeletal  (+) Arthritis ,   Abdominal (+) + obese,   Peds  Hematology   Anesthesia Other Findings   Reproductive/Obstetrics                            Anesthesia Physical Anesthesia Plan  ASA: IV  Anesthesia Plan: MAC   Post-op Pain Management:    Induction: Intravenous  PONV Risk Score and Plan: 2 and Ondansetron and Dexamethasone  Airway Management Planned: Natural Airway and Nasal Cannula  Additional Equipment:   Intra-op Plan:   Post-operative Plan:   Informed Consent: I have reviewed the patients History and Physical, chart, labs and discussed the procedure including the risks, benefits and alternatives for the proposed anesthesia with the patient or authorized representative who has indicated his/her understanding and acceptance.   Dental advisory given  Plan Discussed with: CRNA  Anesthesia Plan Comments:         Anesthesia Quick Evaluation

## 2017-05-08 ENCOUNTER — Ambulatory Visit (INDEPENDENT_AMBULATORY_CARE_PROVIDER_SITE_OTHER): Payer: Medicare HMO | Admitting: Family Medicine

## 2017-05-08 ENCOUNTER — Encounter: Payer: Self-pay | Admitting: Family Medicine

## 2017-05-08 VITALS — BP 98/57 | HR 81 | Temp 97.8°F | Ht 66.0 in

## 2017-05-08 DIAGNOSIS — D508 Other iron deficiency anemias: Secondary | ICD-10-CM | POA: Diagnosis not present

## 2017-05-08 DIAGNOSIS — I4891 Unspecified atrial fibrillation: Secondary | ICD-10-CM | POA: Diagnosis not present

## 2017-05-08 LAB — HEMOGLOBIN, FINGERSTICK: Hemoglobin: 9.2 g/dL — ABNORMAL LOW (ref 11.1–15.9)

## 2017-05-08 MED ORDER — PANTOPRAZOLE SODIUM 40 MG PO TBEC: 40.0000 mg | DELAYED_RELEASE_TABLET | Freq: Every day | ORAL | 3 refills | Status: DC

## 2017-05-08 NOTE — Progress Notes (Signed)
Subjective:  Patient ID: Kimberly Obrien, female    DOB: 1960-09-05  Age: 56 y.o. MRN: [id number]  CC: Follow-up (pt here today for follow up after having transfusion and also had colonoscopy and enoscopy)   HPI SERIAH BROTZMAN presents for  Recheck of anemia. Had EGD yesterday showing gastritis. Colonoscopy showed polyps and non-bleeding internal hemorrhoids. Transfusions done last week after hemoglobin dropped to 7.7. Feeling some improvement in energy. Denies melena. Now off coumadin for 1 week. 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   Depression screen [facility] 2/9 05/08/2017 04/30/2017 03/12/2017  Decreased Interest 0 0 0  Down, Depressed, Hopeless 0 0 0  PHQ - 2 Score 0 0 0  Altered sleeping - - -  Tired, decreased energy - - -  Change in appetite - - -  Feeling bad or failure about yourself  - - -  Trouble concentrating - - -  Moving slowly or fidgety/restless - - -  Suicidal thoughts - - -  PHQ-9 Score - - -    History Annamary has a past medical history of Anticoagulation monitoring by pharmacist; Arthritis; Asthma; Autoimmune hepatitis ([place]); Cataract; COPD (chronic obstructive pulmonary disease) ([place]); Hyperlipidemia; and PE (pulmonary embolism).   She has a past surgical history that includes Tubal ligation; Wrist surgery (Right); Cholecystectomy; Spine surgery; Abdominal hysterectomy; Eye surgery; left and right heart catheterization with coronary angiogram (N/A, 05/13/2014); Incontinence surgery (2009); Cataract extraction, bilateral (Bilateral); Esophagogastroduodenoscopy (egd) with propofol (N/A, 05/07/2017); and Colonoscopy with propofol (N/A, 05/07/2017).   Her family history includes Cirrhosis in her mother; Heart failure in her father; Hyperlipidemia in her father; Hypertension in her brother; Obesity in her daughter.She reports that she quit smoking about 5 years ago. Her smoking use included Cigarettes. She started smoking about 39 years ago. She has a 100.00  pack-year smoking history. She has never used smokeless tobacco. She reports that she does not drink alcohol or use drugs.    ROS Review of Systems  Constitutional: Negative for activity change, appetite change and fever.  HENT: Negative for congestion, rhinorrhea and sore throat.   Eyes: Negative for visual disturbance.  Respiratory: Negative for cough and shortness of breath.   Cardiovascular: Negative for chest pain and palpitations.  Gastrointestinal: Negative for abdominal pain, diarrhea and nausea.  Genitourinary: Negative for dysuria.  Musculoskeletal: Negative for arthralgias and myalgias.    Objective:  BP (!) 98/57   Pulse 81   Temp 97.8 F (36.6 C) (Oral)   Ht 5\' 6"  (1.676 m)   BP Readings from Last 3 Encounters:  05/08/17 (!) 98/57  05/07/17 (!) 117/49  05/01/17 (!) 92/56    Wt Readings from Last 3 Encounters:  05/07/17 220 lb (99.8 kg)  05/01/17 220 lb (99.8 kg)  04/30/17 220 lb (99.8 kg)     Physical Exam  Constitutional: She is oriented to person, place, and time. She appears well-developed and well-nourished. No distress.  HENT:  Head: Normocephalic and atraumatic.  Eyes: Pupils are equal, round, and reactive to light. Conjunctivae are normal.  Neck: Normal range of motion. Neck supple. No thyromegaly present.  Cardiovascular: Normal rate, regular rhythm and normal heart sounds.   No murmur heard. Pulmonary/Chest: Effort normal and breath sounds normal. No respiratory distress. She has no wheezes. She has no rales.  Abdominal: Soft. Bowel sounds are normal. She exhibits no distension. There is no tenderness.  Musculoskeletal: Normal range of motion.  Lymphadenopathy:    She has no cervical adenopathy.  Neurological:  She is alert and oriented to person, place, and time.  Skin: Skin is warm and dry.  Psychiatric: She has a normal mood and affect. Her behavior is normal. Judgment and thought content normal.   Hb= 9.2   Assessment & Plan:    Karee was seen today for follow-up.  Diagnoses and all orders for this visit:  Other iron deficiency anemia -     Hemoglobin, fingerstick  Atrial fibrillation, unspecified type ([place])  Other orders -     pantoprazole (PROTONIX) 40 MG tablet; Take 1 tablet (40 mg total) by mouth daily.       I have discontinued Ms. Pense's esomeprazole and esomeprazole. I am also having her start on pantoprazole. Additionally, I am having her maintain her albuterol, fluticasone, Vitamin D (Ergocalciferol), predniSONE, hyoscyamine, levofloxacin, ferrous fumarate, Polyethylene Glycol 400, ibuprofen, and acetaminophen.  Allergies as of 05/08/2017      Reactions   Aciphex [rabeprazole Sodium] Rash   Diflucan [fluconazole] Rash   Lansoprazole Hives   Sucralfate Nausea Only   Metronidazole Other (See Comments)   Dizziness, nausea, dry mouth and some shortness of breath   Vancomycin Diarrhea   Claritin [loratadine] Other (See Comments)   Tired   Crestor [rosuvastatin] Other (See Comments)   Elevated blood glucose and abdominal pain   Doxycycline Rash   Ibuprofen Other (See Comments)   Upset stomach   Lipitor [atorvastatin] Other (See Comments)   Myalgias, leg pain.  Problems with pancreas, sugars went up also.   Macrobid [nitrofurantoin Macrocrystal] Nausea And Vomiting   Metoprolol Nausea And Vomiting   Nsaids Other (See Comments)   Upset stomach   Omeprazole    bloating   Rabeprazole Rash   Septra [sulfamethoxazole-trimethoprim] Nausea And Vomiting      Medication List       Accurate as of 05/08/17  9:51 PM. Always use your most recent med list.          acetaminophen 500 MG tablet Commonly known as:  TYLENOL Take 1,000 mg by mouth every 6 (six) hours as needed (for pain/headaches.).   albuterol 108 (90 Base) MCG/ACT inhaler Commonly known as:  PROVENTIL HFA;VENTOLIN HFA Inhale into the lungs every 6 (six) hours as needed for wheezing or shortness of breath.   BLINK  TEARS 0.25 % Soln Generic drug:  Polyethylene Glycol 400 Place 1-2 drops into both eyes 3 (three) times daily as needed (for sinus irritation/dry eyes.).   ferrous fumarate 325 (106 Fe) MG Tabs tablet Commonly known as:  HEMOCYTE - 106 mg FE Take 1 tablet (106 mg of iron total) by mouth 2 (two) times daily.   fluticasone 50 MCG/ACT nasal spray Commonly known as:  FLONASE Place 2 sprays into both nostrils daily.   hyoscyamine 0.375 MG 12 hr tablet Commonly known as:  LEVBID Take 1 tablet (0.375 mg total) by mouth 2 (two) times daily.   ibuprofen 200 MG tablet Commonly known as:  ADVIL,MOTRIN Take 200 mg by mouth every 8 (eight) hours as needed (for pain/severe headaches.).   levofloxacin 500 MG tablet Commonly known as:  LEVAQUIN Take 1 tablet (500 mg total) by mouth daily. For 10 days   pantoprazole 40 MG tablet Commonly known as:  PROTONIX Take 1 tablet (40 mg total) by mouth daily.   predniSONE 10 MG tablet Commonly known as:  DELTASONE Take 10 mg by mouth daily with breakfast.   Vitamin D (Ergocalciferol) 50000 units Caps capsule Commonly known as:  DRISDOL Take 1 capsule (50,000  Units total) by mouth every 7 (seven) days.            Discharge Care Instructions        Start     Ordered   05/08/17 0000  Hemoglobin, fingerstick     05/08/17 0916   05/08/17 0000  pantoprazole (PROTONIX) 40 MG tablet  Daily     05/08/17 2151       Follow-up: Return in about 2 weeks (around 05/22/2017).  Claretta Fraise, M.D.

## 2017-05-09 DIAGNOSIS — R002 Palpitations: Secondary | ICD-10-CM | POA: Diagnosis not present

## 2017-05-14 ENCOUNTER — Telehealth: Payer: Self-pay | Admitting: Family Medicine

## 2017-05-14 NOTE — Telephone Encounter (Signed)
Pt is still following up with GI for losing blood and is getting frustrated because she doesn't know what is wrong with her. Advised pt to keep all appts and to call if she starts feeling dizzy, weak, lightheaded or SOB. Pt voiced understanding.

## 2017-05-22 ENCOUNTER — Ambulatory Visit: Payer: Medicare HMO | Admitting: Family Medicine

## 2017-05-23 ENCOUNTER — Encounter: Payer: Self-pay | Admitting: Family Medicine

## 2017-05-23 ENCOUNTER — Ambulatory Visit (INDEPENDENT_AMBULATORY_CARE_PROVIDER_SITE_OTHER): Payer: Medicare HMO | Admitting: Family Medicine

## 2017-05-23 VITALS — BP 109/63 | HR 87 | Temp 98.0°F | Ht 66.0 in | Wt 218.0 lb

## 2017-05-23 DIAGNOSIS — D509 Iron deficiency anemia, unspecified: Secondary | ICD-10-CM

## 2017-05-23 LAB — HEMOGLOBIN, FINGERSTICK: Hemoglobin: 9.7 g/dL — ABNORMAL LOW (ref 11.1–15.9)

## 2017-05-23 NOTE — Progress Notes (Signed)
Subjective:  Patient ID: Kimberly Obrien, female    DOB: 09-04-1960  Age: 56 y.o. MRN: [id number]  CC: Follow-up (pt here today for a follow up but has only been on her Hemocyte and Protonix for a couple days due to being out of town on vacation.)   HPI STOREY STANGELAND presents for Recheck of her anemia. She's been seen by GI.Colonoscopy revealed for tubular adenomas which were removed during the procedure.She was also noted to have gastritis on EGD. Her energy is quite a bit better than it was prior to transfusion. However, she does get tired easily still. She admits she did not start taking the iron until after she been at the beach a week. Therefore she's only been taking it for a few days. So far it has not caused any GI distress.She has been off Coumadin now for about 3 weeks and does not want to go back on. She has no evidence for TIA. She understands the risks involved.  Depression screen [facility] 2/9 05/23/2017 05/08/2017 04/30/2017  Decreased Interest 0 0 0  Down, Depressed, Hopeless 0 0 0  PHQ - 2 Score 0 0 0  Altered sleeping - - -  Tired, decreased energy - - -  Change in appetite - - -  Feeling bad or failure about yourself  - - -  Trouble concentrating - - -  Moving slowly or fidgety/restless - - -  Suicidal thoughts - - -  PHQ-9 Score - - -    History Mckenzey has a past medical history of Anticoagulation monitoring by pharmacist; Arthritis; Asthma; Autoimmune hepatitis ([place]); Cataract; COPD (chronic obstructive pulmonary disease) ([place]); Hyperlipidemia; and PE (pulmonary embolism).   She has a past surgical history that includes Tubal ligation; Wrist surgery (Right); Cholecystectomy; Spine surgery; Abdominal hysterectomy; Eye surgery; left and right heart catheterization with coronary angiogram (N/A, 05/13/2014); Incontinence surgery (2009); Cataract extraction, bilateral (Bilateral); Esophagogastroduodenoscopy (egd) with propofol (N/A, 05/07/2017); and Colonoscopy with  propofol (N/A, 05/07/2017).   Her family history includes Cirrhosis in her mother; Heart failure in her father; Hyperlipidemia in her father; Hypertension in her brother; Obesity in her daughter.She reports that she quit smoking about 5 years ago. Her smoking use included Cigarettes. She started smoking about 39 years ago. She has a 100.00 pack-year smoking history. She has never used smokeless tobacco. She reports that she does not drink alcohol or use drugs.    ROS Review of Systems  Constitutional: Positive for fatigue. Negative for fever.  HENT: Negative for congestion, rhinorrhea and sore throat.   Respiratory: Negative for cough and shortness of breath.   Cardiovascular: Negative for chest pain and palpitations.  Gastrointestinal: Negative for abdominal pain.  Musculoskeletal: Negative for arthralgias and myalgias.    Objective:  BP 109/63   Pulse 87   Temp 98 F (36.7 C) (Oral)   Ht 5\' 6"  (1.676 m)   Wt 218 lb (98.9 kg)   BMI 35.19 kg/m   BP Readings from Last 3 Encounters:  05/23/17 109/63  05/08/17 (!) 98/57  05/07/17 (!) 117/49    Wt Readings from Last 3 Encounters:  05/23/17 218 lb (98.9 kg)  05/07/17 220 lb (99.8 kg)  05/01/17 220 lb (99.8 kg)     Physical Exam  Constitutional: She is oriented to person, place, and time. She appears well-developed and well-nourished. No distress.  HENT:  Head: Normocephalic and atraumatic.  Right Ear: External ear normal.  Left Ear: External ear normal.  Nose: Nose normal.  Mouth/Throat: Oropharynx is clear and moist.  Eyes: Pupils are equal, round, and reactive to light. Conjunctivae and EOM are normal.  Neck: Normal range of motion. Neck supple. No thyromegaly present.  Cardiovascular: Normal rate, regular rhythm and normal heart sounds.   No murmur heard. Pulmonary/Chest: Effort normal and breath sounds normal. No respiratory distress. She has no wheezes. She has no rales.  Abdominal: Soft. Bowel sounds are normal.  She exhibits no distension. There is no tenderness.  Lymphadenopathy:    She has no cervical adenopathy.  Neurological: She is alert and oriented to person, place, and time. She has normal reflexes.  Skin: Skin is warm and dry.  Psychiatric: She has a normal mood and affect. Her behavior is normal. Judgment and thought content normal.      Assessment & Plan:   Luvern was seen today for follow-up.  Diagnoses and all orders for this visit:  Iron deficiency anemia, unspecified iron deficiency anemia type -     Hemoglobin, fingerstick       I have discontinued Ms. Vivian's levofloxacin. I am also having her maintain her albuterol, fluticasone, Vitamin D (Ergocalciferol), predniSONE, hyoscyamine, ferrous fumarate, Polyethylene Glycol 400, ibuprofen, acetaminophen, and pantoprazole.  Allergies as of 05/23/2017      Reactions   Aciphex [rabeprazole Sodium] Rash   Diflucan [fluconazole] Rash   Lansoprazole Hives   Sucralfate Nausea Only   Metronidazole Other (See Comments)   Dizziness, nausea, dry mouth and some shortness of breath   Vancomycin Diarrhea   Claritin [loratadine] Other (See Comments)   Tired   Crestor [rosuvastatin] Other (See Comments)   Elevated blood glucose and abdominal pain   Doxycycline Rash   Ibuprofen Other (See Comments)   Upset stomach   Lipitor [atorvastatin] Other (See Comments)   Myalgias, leg pain.  Problems with pancreas, sugars went up also.   Macrobid [nitrofurantoin Macrocrystal] Nausea And Vomiting   Metoprolol Nausea And Vomiting   Nsaids Other (See Comments)   Upset stomach   Omeprazole    bloating   Rabeprazole Rash   Septra [sulfamethoxazole-trimethoprim] Nausea And Vomiting      Medication List       Accurate as of 05/23/17  6:19 PM. Always use your most recent med list.          acetaminophen 500 MG tablet Commonly known as:  TYLENOL Take 1,000 mg by mouth every 6 (six) hours as needed (for pain/headaches.).     albuterol 108 (90 Base) MCG/ACT inhaler Commonly known as:  PROVENTIL HFA;VENTOLIN HFA Inhale into the lungs every 6 (six) hours as needed for wheezing or shortness of breath.   BLINK TEARS 0.25 % Soln Generic drug:  Polyethylene Glycol 400 Place 1-2 drops into both eyes 3 (three) times daily as needed (for sinus irritation/dry eyes.).   ferrous fumarate 325 (106 Fe) MG Tabs tablet Commonly known as:  HEMOCYTE - 106 mg FE Take 1 tablet (106 mg of iron total) by mouth 2 (two) times daily.   fluticasone 50 MCG/ACT nasal spray Commonly known as:  FLONASE Place 2 sprays into both nostrils daily.   hyoscyamine 0.375 MG 12 hr tablet Commonly known as:  LEVBID Take 1 tablet (0.375 mg total) by mouth 2 (two) times daily.   ibuprofen 200 MG tablet Commonly known as:  ADVIL,MOTRIN Take 200 mg by mouth every 8 (eight) hours as needed (for pain/severe headaches.).   pantoprazole 40 MG tablet Commonly known as:  PROTONIX Take 1 tablet (40 mg total) by  mouth daily.   predniSONE 10 MG tablet Commonly known as:  DELTASONE Take 10 mg by mouth daily with breakfast.   Vitamin D (Ergocalciferol) 50000 units Caps capsule Commonly known as:  DRISDOL Take 1 capsule (50,000 Units total) by mouth every 7 (seven) days.      We'll hold off a bit longer on the Coumadin.This is based on concern for further bleeding possible. I like for her to be on a PPI bit longer before considering that.  Follow-up: Return in about 1 month (around 06/23/2017).  Claretta Fraise, M.D.

## 2017-05-30 DIAGNOSIS — K754 Autoimmune hepatitis: Secondary | ICD-10-CM | POA: Diagnosis not present

## 2017-05-30 DIAGNOSIS — K921 Melena: Secondary | ICD-10-CM | POA: Diagnosis not present

## 2017-05-30 DIAGNOSIS — D509 Iron deficiency anemia, unspecified: Secondary | ICD-10-CM | POA: Diagnosis not present

## 2017-06-08 ENCOUNTER — Encounter: Payer: Self-pay | Admitting: Pharmacist Clinician (PhC)/ Clinical Pharmacy Specialist

## 2017-06-11 DIAGNOSIS — D509 Iron deficiency anemia, unspecified: Secondary | ICD-10-CM | POA: Diagnosis not present

## 2017-06-22 ENCOUNTER — Other Ambulatory Visit: Payer: Self-pay | Admitting: Family Medicine

## 2017-06-25 DIAGNOSIS — D509 Iron deficiency anemia, unspecified: Secondary | ICD-10-CM | POA: Diagnosis not present

## 2017-06-26 ENCOUNTER — Encounter: Payer: Self-pay | Admitting: Family Medicine

## 2017-06-26 ENCOUNTER — Ambulatory Visit (INDEPENDENT_AMBULATORY_CARE_PROVIDER_SITE_OTHER): Payer: Medicare HMO | Admitting: Family Medicine

## 2017-06-26 VITALS — BP 110/57 | HR 71 | Temp 97.2°F | Ht 66.0 in | Wt 219.0 lb

## 2017-06-26 DIAGNOSIS — D509 Iron deficiency anemia, unspecified: Secondary | ICD-10-CM

## 2017-06-26 DIAGNOSIS — F411 Generalized anxiety disorder: Secondary | ICD-10-CM

## 2017-06-26 DIAGNOSIS — K580 Irritable bowel syndrome with diarrhea: Secondary | ICD-10-CM | POA: Diagnosis not present

## 2017-06-26 LAB — HEMOGLOBIN, FINGERSTICK: Hemoglobin: 11.9 g/dL (ref 11.1–15.9)

## 2017-06-26 NOTE — Progress Notes (Signed)
Subjective:  Patient ID: Kimberly Obrien, female    DOB: Aug 10, 1961  Age: 56 y.o. MRN: [id number]  CC: Follow-up (pt here today for 1 month follow up and she has had 2 iron infusions ordered by Dr Oletta Lamas. )   HPI [name] presents for [name] presents for still having some right upper quadrant discomfort. She has been followed by GI and Dr. Oletta Lamas has given her 2 iron infusions. Her energy is somewhat better but not dramatically improved. She is here for recheck of her hemoglobin and pain. She is still off of Coumadin. She states that her reason for using anticoagulation is due to a coagulation defect and pulmonary embolism x2. She was told she have to be on it for life. However, she is concerned about the bleeding and the hemoglobin and prefers not to at this time.  She is followed by cardiology for tachycardia. She has no excessive shortness of breath and no chest pain. Fatigue has improved but again has not resolved. She still has problems with anxiety. She gets nervous and worried very easily.   Depression screen [facility] 2/9 06/26/2017 05/23/2017 05/08/2017  Decreased Interest 0 0 0  Down, Depressed, Hopeless 0 0 0  PHQ - 2 Score 0 0 0  Altered sleeping - - -  Tired, decreased energy - - -  Change in appetite - - -  Feeling bad or failure about yourself  - - -  Trouble concentrating - - -  Moving slowly or fidgety/restless - - -  Suicidal thoughts - - -  PHQ-9 Score - - -    History Shatyra has a past medical history of Anticoagulation monitoring by pharmacist, Arthritis, Asthma, Autoimmune hepatitis ([place]), Cataract, COPD (chronic obstructive pulmonary disease) ([place]), Hyperlipidemia, and PE (pulmonary embolism).   She has a past surgical history that includes Tubal ligation; Wrist surgery (Right); Cholecystectomy; Spine surgery; Abdominal hysterectomy; Eye surgery; Incontinence surgery (2009); Cataract extraction, bilateral (Bilateral);  ESOPHAGOGASTRODUODENOSCOPY (EGD) WITH PROPOFOL (N/A, 05/07/2017); COLONOSCOPY WITH PROPOFOL (N/A, 05/07/2017); and LEFT AND RIGHT HEART CATHETERIZATION WITH CORONARY ANGIOGRAM (N/A, 05/13/2014).   Her family history includes Cirrhosis in her mother; Heart failure in her father; Hyperlipidemia in her father; Hypertension in her brother; Obesity in her daughter.She reports that she quit smoking about 5 years ago. Her smoking use included cigarettes. She started smoking about 39 years ago. She has a 100.00 pack-year smoking history. she has never used smokeless tobacco. She reports that she does not drink alcohol or use drugs.    ROS Review of Systems  Constitutional: Negative for activity change, appetite change and fever.  HENT: Negative for congestion, rhinorrhea and sore throat.   Eyes: Negative for visual disturbance.  Respiratory: Negative for cough and shortness of breath.   Cardiovascular: Negative for chest pain and palpitations.  Gastrointestinal: Positive for abdominal pain and diarrhea. Negative for nausea and vomiting.  Genitourinary: Negative for dysuria.  Musculoskeletal: Negative for arthralgias and myalgias.    Objective:  BP (!) 110/57   Pulse 71   Temp (!) 97.2 F (36.2 C) (Oral)   Ht 5\' 6"  (1.676 m)   Wt 219 lb (99.3 kg)   BMI 35.35 kg/m   BP Readings from Last 3 Encounters:  06/26/17 (!) 110/57  05/23/17 109/63  05/08/17 (!) 98/57    Wt Readings from Last 3 Encounters:  06/26/17 219 lb (99.3 kg)  05/23/17 218 lb (98.9 kg)  05/07/17 220 lb (99.8 kg)     Physical Exam  Constitutional: She is oriented to person, place, and time. She appears well-developed and well-nourished. No distress.  HENT:  Head: Normocephalic and atraumatic.  Right Ear: External ear normal.  Left Ear: External ear normal.  Nose: Nose normal.  Mouth/Throat: Oropharynx is clear and moist.  Eyes: Conjunctivae and EOM are normal. Pupils are equal, round, and reactive to light.  Neck:  Normal range of motion. Neck supple. No thyromegaly present.  Cardiovascular: Normal rate, regular rhythm and normal heart sounds.  No murmur heard. Pulmonary/Chest: Effort normal and breath sounds normal. No respiratory distress. She has no wheezes. She has no rales.  Abdominal: Soft. Bowel sounds are normal. She exhibits no distension. There is no tenderness.  Lymphadenopathy:    She has no cervical adenopathy.  Neurological: She is alert and oriented to person, place, and time. She has normal reflexes.  Skin: Skin is warm and dry.  Psychiatric: She has a normal mood and affect. Her behavior is normal. Judgment and thought content normal.      Assessment & Plan:   Roberto was seen today for follow-up.  Diagnoses and all orders for this visit:  Iron deficiency anemia, unspecified iron deficiency anemia type -     Hemoglobin, fingerstick  GAD (generalized anxiety disorder)  Irritable bowel syndrome with diarrhea       I have discontinued Mardene Celeste [employer]'s ferrous fumarate. I am also having her maintain her albuterol, Vitamin D (Ergocalciferol), predniSONE, hyoscyamine, Polyethylene Glycol 400, ibuprofen, acetaminophen, pantoprazole, and fluticasone.  Allergies as of 06/26/2017      Reactions   Aciphex [rabeprazole Sodium] Rash   Diflucan [fluconazole] Rash   Lansoprazole Hives   Sucralfate Nausea Only   Metronidazole Other (See Comments)   Dizziness, nausea, dry mouth and some shortness of breath   Vancomycin Diarrhea   Claritin [loratadine] Other (See Comments)   Tired   Crestor [rosuvastatin] Other (See Comments)   Elevated blood glucose and abdominal pain   Doxycycline Rash   Ibuprofen Other (See Comments)   Upset stomach   Lipitor [atorvastatin] Other (See Comments)   Myalgias, leg pain.  Problems with pancreas, sugars went up also.   Macrobid [nitrofurantoin Macrocrystal] Nausea And Vomiting   Metoprolol Nausea And Vomiting   Nsaids Other (See Comments)    Upset stomach   Omeprazole    bloating   Rabeprazole Rash   Septra [sulfamethoxazole-trimethoprim] Nausea And Vomiting      Medication List        Accurate as of 06/26/17 11:59 PM. Always use your most recent med list.          acetaminophen 500 MG tablet Commonly known as:  TYLENOL Take 1,000 mg by mouth every 6 (six) hours as needed (for pain/headaches.).   albuterol 108 (90 Base) MCG/ACT inhaler Commonly known as:  PROVENTIL HFA;VENTOLIN HFA Inhale into the lungs every 6 (six) hours as needed for wheezing or shortness of breath.   BLINK TEARS 0.25 % Soln Generic drug:  Polyethylene Glycol 400 Place 1-2 drops into both eyes 3 (three) times daily as needed (for sinus irritation/dry eyes.).   fluticasone 50 MCG/ACT nasal spray Commonly known as:  FLONASE USE TWO SPRAY(S) IN EACH NOSTRIL ONCE DAILY   hyoscyamine 0.375 MG 12 hr tablet Commonly known as:  LEVBID Take 1 tablet (0.375 mg total) by mouth 2 (two) times daily.   ibuprofen 200 MG tablet Commonly known as:  ADVIL,MOTRIN Take 200 mg by mouth every 8 (eight) hours as needed (for pain/severe headaches.).  pantoprazole 40 MG tablet Commonly known as:  PROTONIX Take 1 tablet (40 mg total) by mouth daily.   predniSONE 10 MG tablet Commonly known as:  DELTASONE Take 10 mg by mouth daily with breakfast.   Vitamin D (Ergocalciferol) 50000 units Caps capsule Commonly known as:  DRISDOL Take 1 capsule (50,000 Units total) by mouth every 7 (seven) days.        Follow-up: Return if symptoms worsen or fail to improve.  Claretta Fraise, M.D.

## 2017-07-09 DIAGNOSIS — D509 Iron deficiency anemia, unspecified: Secondary | ICD-10-CM | POA: Diagnosis not present

## 2017-07-17 DIAGNOSIS — R197 Diarrhea, unspecified: Secondary | ICD-10-CM | POA: Diagnosis not present

## 2017-07-17 DIAGNOSIS — K754 Autoimmune hepatitis: Secondary | ICD-10-CM | POA: Diagnosis not present

## 2017-07-17 DIAGNOSIS — R1012 Left upper quadrant pain: Secondary | ICD-10-CM | POA: Diagnosis not present

## 2017-07-17 DIAGNOSIS — R195 Other fecal abnormalities: Secondary | ICD-10-CM | POA: Diagnosis not present

## 2017-08-22 DIAGNOSIS — K921 Melena: Secondary | ICD-10-CM | POA: Diagnosis not present

## 2017-08-23 ENCOUNTER — Ambulatory Visit (INDEPENDENT_AMBULATORY_CARE_PROVIDER_SITE_OTHER): Payer: Medicare HMO | Admitting: Family Medicine

## 2017-08-23 ENCOUNTER — Encounter: Payer: Self-pay | Admitting: Family Medicine

## 2017-08-23 VITALS — BP 111/68 | HR 90 | Temp 97.8°F | Ht 66.0 in | Wt 219.0 lb

## 2017-08-23 DIAGNOSIS — J0111 Acute recurrent frontal sinusitis: Secondary | ICD-10-CM | POA: Diagnosis not present

## 2017-08-23 MED ORDER — AMOXICILLIN-POT CLAVULANATE 875-125 MG PO TABS: 1.0000 | ORAL_TABLET | Freq: Two times a day (BID) | ORAL | 0 refills | Status: DC

## 2017-08-23 NOTE — Progress Notes (Signed)
BP 111/68   Pulse 90   Temp 97.8 F (36.6 C) (Oral)   Ht 5' 6"  (1.676 m)   Wt 219 lb (99.3 kg)   BMI 35.35 kg/m    Subjective:    Patient ID: Kimberly Obrien, female    DOB: 05/05/1961, 57 y.o.   MRN: [id number]  HPI: Kimberly Obrien is a 57 y.o. female presenting on 08/23/2017 for Sinusitis (sinus congestion and drainage, sore throat, runny nose, sinus pressure and headache, cough; symptoms began about 2 weeks ago, using Flonase but it does not seem to help)   HPI Cough and sinus congestion and sore throat Patient is having cough and sinus congestion and sore throat that has been going on for about 2 weeks.  She does have postnasal drainage and nasal drainage as well.  She has a lot of sinus pressure and a headache.  She has been using Flonase and Mucinex but has not seemed to improve it.  She is also using her albuterol for the occasional coughing spells that she gets.  She denies any shortness of breath or wheezing.  She denies any fevers or chills.  She is coming in today just because it is not getting any better  Relevant past medical, surgical, family and social history reviewed and updated as indicated. Interim medical history since our last visit reviewed. Allergies and medications reviewed and updated.  Review of Systems  Constitutional: Negative for chills and fever.  HENT: Positive for congestion, postnasal drip, rhinorrhea, sinus pressure, sneezing and sore throat. Negative for ear discharge and ear pain.   Eyes: Negative for pain, redness and visual disturbance.  Respiratory: Positive for cough. Negative for chest tightness and shortness of breath.   Cardiovascular: Negative for chest pain and leg swelling.  Genitourinary: Negative for difficulty urinating and dysuria.  Musculoskeletal: Negative for back pain and gait problem.  Skin: Negative for rash.  Neurological: Negative for light-headedness and headaches.  Psychiatric/Behavioral: Negative for agitation and  behavioral problems.  All other systems reviewed and are negative.   Per HPI unless specifically indicated above        Objective:    BP 111/68   Pulse 90   Temp 97.8 F (36.6 C) (Oral)   Ht 5' 6"  (1.676 m)   Wt 219 lb (99.3 kg)   BMI 35.35 kg/m   Wt Readings from Last 3 Encounters:  08/23/17 219 lb (99.3 kg)  06/26/17 219 lb (99.3 kg)  05/23/17 218 lb (98.9 kg)    Physical Exam  Constitutional: She is oriented to person, place, and time. She appears well-developed and well-nourished. No distress.  HENT:  Right Ear: Tympanic membrane, external ear and ear canal normal.  Left Ear: Tympanic membrane, external ear and ear canal normal.  Nose: Mucosal edema and rhinorrhea present. No epistaxis. Right sinus exhibits maxillary sinus tenderness. Right sinus exhibits no frontal sinus tenderness. Left sinus exhibits maxillary sinus tenderness. Left sinus exhibits no frontal sinus tenderness.  Mouth/Throat: Uvula is midline and mucous membranes are normal. Posterior oropharyngeal edema and posterior oropharyngeal erythema present. No oropharyngeal exudate or tonsillar abscesses.  Eyes: Conjunctivae are normal.  Neck: Neck supple. No thyromegaly present.  Cardiovascular: Normal rate, regular rhythm, normal heart sounds and intact distal pulses.  No murmur heard. Pulmonary/Chest: Effort normal and breath sounds normal. No respiratory distress. She has no wheezes.  Musculoskeletal: Normal range of motion. She exhibits no edema or tenderness.  Lymphadenopathy:    She has no cervical adenopathy.  Neurological: She is alert and oriented to person, place, and time. Coordination normal.  Skin: Skin is warm and dry. No rash noted. She is not diaphoretic.  Psychiatric: She has a normal mood and affect. Her behavior is normal.  Vitals reviewed.     Assessment & Plan:   Problem List Items Addressed This Visit    None    Visit Diagnoses    Acute recurrent frontal sinusitis    -  Primary     Relevant Medications   amoxicillin-clavulanate (AUGMENTIN) 875-125 MG tablet       Follow up plan: Return if symptoms worsen or fail to improve.  Counseling provided for all of the vaccine components No orders of the defined types were placed in this encounter.   Caryl Pina, MD [facility] 08/23/2017, 12:04 PM

## 2017-08-29 DIAGNOSIS — K754 Autoimmune hepatitis: Secondary | ICD-10-CM | POA: Diagnosis not present

## 2017-08-29 DIAGNOSIS — K580 Irritable bowel syndrome with diarrhea: Secondary | ICD-10-CM | POA: Diagnosis not present

## 2017-08-29 DIAGNOSIS — K921 Melena: Secondary | ICD-10-CM | POA: Diagnosis not present

## 2017-08-29 DIAGNOSIS — D509 Iron deficiency anemia, unspecified: Secondary | ICD-10-CM | POA: Diagnosis not present

## 2017-09-26 DIAGNOSIS — K5520 Angiodysplasia of colon without hemorrhage: Secondary | ICD-10-CM | POA: Diagnosis not present

## 2017-09-26 DIAGNOSIS — K921 Melena: Secondary | ICD-10-CM | POA: Diagnosis not present

## 2017-09-26 DIAGNOSIS — Z86711 Personal history of pulmonary embolism: Secondary | ICD-10-CM | POA: Diagnosis not present

## 2017-09-26 DIAGNOSIS — D509 Iron deficiency anemia, unspecified: Secondary | ICD-10-CM | POA: Diagnosis not present

## 2017-09-26 DIAGNOSIS — K754 Autoimmune hepatitis: Secondary | ICD-10-CM | POA: Diagnosis not present

## 2017-10-01 NOTE — Progress Notes (Signed)
CONSULT NOTE  Patient Care Team: Claretta Fraise, MD as PCP - General (Family Medicine) Sinda Du, MD as Consulting Physician (Pulmonary Disease) Teena Irani, MD (Inactive) as Consulting Physician (Gastroenterology) [name], MD as Consulting Physician (Cardiology)  CHIEF COMPLAINTS/PURPOSE OF CONSULTATION:  H/o Pulmonary Embolism  HISTORY OF PRESENTING ILLNESS:   Kimberly Obrien 57 y.o. female is here because of a referral from Dr. Laurence Spates regarding her history of pulmonary emboli with ?underlying coagultaion defect.   The pt reports that she is doing well overall. She reports seeing a Hematologist first after having a PE on her right side, damaging the lower lobe in 1999. She reports having a second in 2003 with 3 small PE in her right upper lobe. She reports having been on estrogen containing birth control in 1999 which she was told likely led to her PE, and was smoking at the time. She reports taking a diet pill with hormones in 2003 when she had her second PE. She denies having genetic testing regarding these Pes. She reports taking Warfarin 6 months after the first PE. She reports having taken Coumadin from 2003 until 3 months ago when she stopped due to her GI bleeding.  She denies having had any other blood clots, through 4 pregnancies and a few surgeries.  She denies a FHx of blood clotting or bleeding problems. She reports being SOB when she is losing lots of blood. She reports having a blood transfusion on 05/07/17, and receiving two iron infusions in the last 4 motnhs. She denies allergic reactions to IV iron, but does note stomach issues and drowsiness for a couple days following IV iron.   Dr. Oletta Lamas has been following her iron levels. She reports using an inhaler.  She reports that Dr. 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   Of note prior to  the patient's visit, pt has had an Abdomen CT completed on 04/19/17 with results revealing Mild splenomegaly.  On 05/07/17 she had a colonoscopy with 4 polyps removed, and non-bleeding internal hemorrhoids and upper endoscopy (05/07/17) with Gastritis. Biopsied. Normal examined duodenum. Blood found in stool.  Most recent lab results (08/30/17) of CBC, CMP is as follows: all values are WNL except for Glucose at 128, Total bilirubin at 1.4, AST at 68, ALT at 64, MCH at 26.4, Plt at 136k, Neut % at 75.5%.   On review of systems, pt reports occasional SOB, blood in the stools, abdominal pains, and denies mouth ulcers, and any other symptoms.   On PMHx the pt reports IBS, Positive hemoccult card, RRR with murmur, abdominal pain, respiratory disease, migraines, vertigo, COPD, arthritis, DJD, abnormal heart rhythm, fatty liver, pulmonary embolism, pulmonary edema, tachycardia, Vit D deficiency, arthralgia, insomnia, fatigue, hyperlipidemia, anxiety, dysuria, and sleep apnea.  On Surgical Hx the pt reports back surgery x4 (1997, 2006, 2006, 2007), tubal ligation (1985), wrist surgery x2 (1995 x2), cholecystectomy (2001), colonoscopy x2 (2007, 2014), EGD x2 (2002, 2017), hysterectomy partial (2009), rt eye cataract (2013), and cardiac catheterization (2015). On Social Hx the pt reports quitting smoking in 04/18/12 after 30 years, no ETOH use, and being occupationally disabled.    MEDICAL HISTORY:  Past Medical History:  Diagnosis Date  . Anticoagulation monitoring by pharmacist   . Arthritis   . Asthma   . Autoimmune hepatitis ([place])   . Cataract   . COPD (chronic obstructive pulmonary disease) ([place])   . Hyperlipidemia   .  PE (pulmonary embolism)     SURGICAL HISTORY: Past Surgical History:  Procedure Laterality Date  . ABDOMINAL HYSTERECTOMY    . CATARACT EXTRACTION, BILATERAL Bilateral   . CHOLECYSTECTOMY    . COLONOSCOPY WITH PROPOFOL N/A 05/07/2017   Procedure: COLONOSCOPY WITH PROPOFOL;   Surgeon: Laurence Spates, MD;  Location: [facility];  Service: Endoscopy;  Laterality: N/A;  . ESOPHAGOGASTRODUODENOSCOPY (EGD) WITH PROPOFOL N/A 05/07/2017   Procedure: ESOPHAGOGASTRODUODENOSCOPY (EGD) WITH PROPOFOL;  Surgeon: Laurence Spates, MD;  Location: [facility];  Service: Endoscopy;  Laterality: N/A;  . EYE SURGERY     Cataracts  . INCONTINENCE SURGERY  2009  . LEFT AND RIGHT HEART CATHETERIZATION WITH CORONARY ANGIOGRAM N/A 05/13/2014   Procedure: LEFT AND RIGHT HEART CATHETERIZATION WITH CORONARY ANGIOGRAM;  Surgeon: [name], MD;  Location: [facility] CATH LAB;  Service: Cardiovascular;  Laterality: N/A;  . SPINE SURGERY     L5 S1 fusion  . TUBAL LIGATION    . WRIST SURGERY Right    Had surgery twice to shave bone for blood circulation improvement.    SOCIAL HISTORY: Social History   Socioeconomic History  . Marital status: Married    Spouse name: Not on file  . Number of children: 4  . Years of education: Not on file  . Highest education level: Not on file  Social Needs  . Financial resource strain: Not on file  . Food insecurity - worry: Not on file  . Food insecurity - inability: Not on file  . Transportation needs - medical: Not on file  . Transportation needs - non-medical: Not on file  Occupational History  . Occupation: Unemployed  Tobacco Use  . Smoking status: Former Smoker    Packs/day: 2.00    Years: 50.00    Pack years: 100.00    Types: Cigarettes    Start date: 08/22/1977    Last attempt to quit: 04/21/2012    Years since quitting: 5.4  . Smokeless tobacco: Never Used  Substance and Sexual Activity  . Alcohol use: No  . Drug use: No  . Sexual activity: Yes  Other Topics Concern  . Not on file  Social History Narrative  . Not on file    FAMILY HISTORY: Family History  Problem Relation Age of Onset  . Cirrhosis Mother   . Hyperlipidemia Father   . Heart failure Father   . Obesity Daughter   . Hypertension Brother   . Heart attack  Neg Hx     ALLERGIES:  is allergic to aciphex [rabeprazole sodium]; diflucan [fluconazole]; lansoprazole; sucralfate; metronidazole; vancomycin; claritin [loratadine]; crestor [rosuvastatin]; doxycycline; ibuprofen; lipitor [atorvastatin]; macrobid [nitrofurantoin macrocrystal]; metoprolol; nsaids; omeprazole; rabeprazole; and septra [sulfamethoxazole-trimethoprim].  MEDICATIONS:  Current Outpatient Medications  Medication Sig Dispense Refill  . acetaminophen (TYLENOL) 500 MG tablet Take 1,000 mg by mouth every 6 (six) hours as needed (for pain/headaches.).    Marland Kitchen albuterol (PROVENTIL HFA;VENTOLIN HFA) 108 (90 BASE) MCG/ACT inhaler Inhale into the lungs every 6 (six) hours as needed for wheezing or shortness of breath.    Marland Kitchen amoxicillin-clavulanate (AUGMENTIN) 875-125 MG tablet Take 1 tablet by mouth 2 (two) times daily. 20 tablet 0  . esomeprazole (NEXIUM) 40 MG capsule Take 40 mg by mouth daily at 12 noon.    . fluticasone (FLONASE) 50 MCG/ACT nasal spray USE TWO SPRAY(S) IN EACH NOSTRIL ONCE DAILY 16 g 5  . ibuprofen (ADVIL,MOTRIN) 200 MG tablet Take 200 mg by mouth every 8 (eight) hours as needed (for pain/severe headaches.).    Marland Kitchen  predniSONE (DELTASONE) 10 MG tablet Take 10 mg by mouth daily with breakfast.     . Vitamin D, Ergocalciferol, (DRISDOL) 50000 units CAPS capsule Take 1 capsule (50,000 Units total) by mouth every 7 (seven) days. 12 capsule 1   No current facility-administered medications for this visit.     REVIEW OF SYSTEMS:   Constitutional: Denies fevers, chills or abnormal night sweats Eyes: Denies blurriness of vision, double vision or watery eyes Ears, nose, mouth, throat, and face: Denies mucositis or sore throat Respiratory: Denies cough, dyspnea or wheezes Cardiovascular: Denies palpitation, chest discomfort or lower extremity swelling Gastrointestinal:  Denies nausea, heartburn or change in bowel habits Skin: Denies abnormal skin rashes Lymphatics: Denies new  lymphadenopathy or easy bruising Neurological:Denies numbness, tingling or new weaknesses Behavioral/Psych: Mood is stable, no new changes  All other systems were reviewed with the patient and are negative.  PHYSICAL EXAMINATION:  Vitals:   10/03/17 0946  BP: (!) 130/50  Pulse: 84  Resp: 18  Temp: 97.7 F (36.5 C)  SpO2: 97%   Filed Weights   10/03/17 0946  Weight: 223 lb 14.4 oz (101.6 kg)    GENERAL:alert, no distress and comfortable SKIN: skin color, texture, turgor are normal, no rashes or significant lesions EYES: normal, conjunctiva are pink and non-injected, sclera clear OROPHARYNX:no exudate, no erythema and lips, buccal mucosa, and tongue normal  NECK: supple, thyroid normal size, non-tender, without nodularity LYMPH:  no palpable lymphadenopathy in the cervical, axillary or inguinal LUNGS: clear to auscultation and percussion with normal breathing effort HEART: regular rate & rhythm and no murmurs and no lower extremity edema ABDOMEN:abdomen soft, non-tender and normal bowel sounds Musculoskeletal:no cyanosis of digits and no clubbing  PSYCH: alert & oriented x 3 with fluent speech NEURO: no focal motor/sensory deficits  LABORATORY DATA:  I have reviewed the data as listed Recent Results (from the past 2160 hour(s))  Beta-2-glycoprotein i abs, IgG/M/A     Status: None   Collection Time: 10/03/17 12:02 PM  Result Value Ref Range   Beta-2 Glyco I IgG <9 0 - 20 GPI IgG units    Comment: (NOTE) The reference interval reflects a 3SD or 99th percentile interval, which is thought to represent a potentially clinically significant result in accordance with the International Consensus Statement on the classification criteria for definitive antiphospholipid syndrome (APS). J Thromb Haem 2006;4:295-306.    Beta-2-Glycoprotein I IgM 11 0 - 32 GPI IgM units    Comment: (NOTE) The reference interval reflects a 3SD or 99th percentile interval, which is thought to  represent a potentially clinically significant result in accordance with the International Consensus Statement on the classification criteria for definitive antiphospholipid syndrome (APS). J Thromb Haem 2006;4:295-306. Performed At: [facility] [place], [place] [id number] Rush Farmer MD [id number]    Beta-2-Glycoprotein I IgA <9 0 - 25 GPI IgA units    Comment: (NOTE) The reference interval reflects a 3SD or 99th percentile interval, which is thought to represent a potentially clinically significant result in accordance with the International Consensus Statement on the classification criteria for definitive antiphospholipid syndrome (APS). J Thromb Haem 2006;4:295-306. Performed at [facility], [place] [address] [place], [place] [address]   Cardiolipin antibodies, IgG, IgM, IgA     Status: Abnormal   Collection Time: 10/03/17 12:02 PM  Result Value Ref Range   Anticardiolipin IgG <9 0 - 14 GPL U/mL    Comment: (NOTE)  Negative:              <15                          Indeterminate:     15 - 20                          Low-Med Positive: >20 - 80                          High Positive:         >80    Anticardiolipin IgM 18 (H) 0 - 12 MPL U/mL    Comment: (NOTE)                          Negative:              <13                          Indeterminate:     13 - 20                          Low-Med Positive: >20 - 80                          High Positive:         >80    Anticardiolipin IgA <9 0 - 11 APL U/mL    Comment: (NOTE)                          Negative:              <12                          Indeterminate:     12 - 20                          Low-Med Positive: >20 - 80                          High Positive:         >80 Performed At: [facility] [address] [place], [place] [id number] Rush Farmer MD DQ:[id number] Performed at [facility], [place]  [address], [place], [place] [address]   Lupus anticoagulant panel     Status: None   Collection Time: 10/03/17 12:02 PM  Result Value Ref Range   PTT Lupus Anticoagulant 37.6 0.0 - 51.9 sec   DRVVT 42.7 0.0 - 47.0 sec   Lupus Anticoag Interp Comment:     Comment: (NOTE) No lupus anticoagulant was detected. Performed At: [facility] [place], [place] [id number] Rush Farmer MD [id number] Performed at [facility], [place] [address] [place], [place] [address]   Prothrombin gene mutation     Status: None   Collection Time: 10/03/17 12:02 PM  Result Value Ref Range   Recommendations-PTGENE: Comment     Comment: (NOTE) NEGATIVE No mutation identified. Comment: A point mutation ([id number]) in the factor II (prothrombin) gene is the second most common cause of inherited thrombophilia. The  incidence of this mutation in the [place] Caucasian population is about 2% and in the [place] American population it is approximately 0.5%. This mutation is rare in the [place] and Native American population. Being heterozygous for a prothrombin mutation increases the risk for developing venous thrombosis about 2 to 3 times above the general population risk. Being homozygous for the prothrombin gene mutation increases the relative risk for venous thrombosis further, although it is not yet known how much further the risk is increased. In women heterozygous for the prothrombin gene mutation, the use of estrogen containing oral contraceptives increases the relative risk of venous thrombosis about 16 times and the risk of developing cerebral thrombosis is also significantly increased. In pregnancy the pr othrombin gene mutation increases risk for venous thrombosis and may increase risk for stillbirth, placental abruption, pre-eclampsia and fetal growth restriction. If the patient possesses two or more congenital or acquired thrombophilic risk factors, the  risk for thrombosis may rise to more than the sum of the risk ratios for the individual mutations. This assay detects only the prothrombin G20210A mutation and does not measure genetic abnormalities elsewhere in the genome. Other thrombotic risk factors may be pursued through systematic clinical laboratory analysis. These factors include the R506Q ([place]) mutation in the Factor V gene, plasma homocysteine levels, as well as testing for deficiencies of antithrombin III, protein C and protein S. Genetic Counselors are available for health care providers to discuss results at [phone]). Methodology: DNA analysis of the Factor II gene was performed by PCR amplification followed by restriction analysis. The di agnostic sensitivity is >99% for both. All the tests must be combined with clinical information for the most accurate interpretation. Molecular-based testing is highly accurate, but as in any laboratory test, diagnostic errors may occur. This test was developed and its performance characteristics determined by [facility]. It has not been cleared or approved by the Food and Drug Administration. Poort SR, et al. Blood. 1996; 79:0240-9735. Varga EA. Circulation. 2004; 329:J24-Q68. Mervin Hack, et [place]; 19:700-703. Allison Quarry, PhD, [facility] Ruben Reason, PhD, [facility] Annetta Maw, M.S., PhD, [facility] Alfredo Bach, PhD, [facility] Norva Riffle, PhD, [facility] Earlean Polka, PhD, [facility] Performed At: [facility] [address] [place], [place] [id number] Nechama Guard MD [id number] Performed at [facility], [place] [address], [place], [place] [address]   Iron and TIBC     Status: Abnormal   Collection Time: 10/03/17 12:02 PM  Result Value Ref Range   Iron 35 (L) 41 - 142 ug/dL   TIBC 439 236 - 444 ug/dL   Saturation Ratios 8 (L) 21 - 57 %   UIBC 404 ug/dL    Comment: Performed at [facility], [place] [address], [place], [place] [address]  Ferritin     Status: None   Collection Time: 10/03/17 12:02 PM  Result Value Ref Range   Ferritin 27 9 - 269 ng/mL    Comment: Performed at [facility], [place] [address] [place], [place] [address]  CMP ([place] only)     Status: Abnormal   Collection Time: 10/03/17 12:02 PM  Result Value Ref Range   Sodium 141 136 - 145 mmol/L   Potassium 3.7 3.5 - 5.1 mmol/L   Chloride 106 98 - 109 mmol/L   CO2 25 22 - 29 mmol/L   Glucose, Bld 143 (H) 70 - 140 mg/dL   BUN 8 7 -  26 mg/dL   Creatinine 0.73 0.60 - 1.10 mg/dL   Calcium 8.8 8.4 - 10.4 mg/dL   Total Protein 6.3 (L) 6.4 - 8.3 g/dL   Albumin 3.4 (L) 3.5 - 5.0 g/dL   AST 66 (H) 5 - 34 U/L   ALT 52 0 - 55 U/L   Alkaline Phosphatase 143 40 - 150 U/L   Total Bilirubin 1.7 (H) 0.2 - 1.2 mg/dL   GFR, Est Non Af Am >60 >60 mL/min   GFR, Est AFR Am >60 >60 mL/min    Comment: (NOTE) The eGFR has been calculated using the CKD EPI equation. This calculation has not been validated in all clinical situations. eGFR's persistently <60 mL/min signify possible Chronic Kidney Disease.    Anion gap 10 3 - 11    Comment: Performed at [facility], [address], [place], [place] [address]  CBC with Differential ([place] Only)     Status: Abnormal   Collection Time: 10/03/17 12:02 PM  Result Value Ref Range   WBC Count 5.2 3.9 - 10.3 K/uL   RBC 4.35 3.70 - 5.45 MIL/uL   Hemoglobin 10.7 (L) 11.6 - 15.9 g/dL   HCT 35.1 34.8 - 46.6 %   MCV 80.7 79.5 - 101.0 fL   MCH 24.6 (L) 25.1 - 34.0 pg   MCHC 30.5 (L) 31.5 - 36.0 g/dL   RDW 14.2 11.2 - 14.5 %   Platelet Count 140 (L) 145 - 400 K/uL   Neutrophils Relative % 80 %   Neutro Abs 4.1 1.5 - 6.5 K/uL   Lymphocytes Relative 16 %   Lymphs Abs 0.8 (L) 0.9 - 3.3 K/uL   Monocytes Relative 3 %   Monocytes Absolute 0.2 0.1 - 0.9 K/uL   Eosinophils Relative 1 %   Eosinophils  Absolute 0.0 0.0 - 0.5 K/uL   Basophils Relative 0 %   Basophils Absolute 0.0 0.0 - 0.1 K/uL   nRBC 0 0 /100 WBC    Comment: Performed at [facility], [place] [address] [place], [place] [address]  Reticulocytes     Status: Abnormal   Collection Time: 10/03/17 12:02 PM  Result Value Ref Range   Retic Ct Pct 2.7 (H) 0.7 - 2.1 %   RBC. 4.35 3.70 - 5.45 MIL/uL   Retic Count, Absolute 117.5 (H) 33.7 - 90.7 K/uL    Comment: Performed at [facility], [place] [address], [place], [place] [address]    RADIOGRAPHIC STUDIES: I have personally reviewed the radiological images as listed and agreed with the findings in the report. No results found.  ASSESSMENT & PLAN:  57 y.o. female with   1.  History of recurrent Pulmonary Embolisms Her previous events appears to have been triggered by taking estrogen containing hormones and smoking. No overt Fhx of thrombophilias and no previous VTE with several pregnancies and surgeries - suggest against significant hereditary thrombophilia. Plan -currently patient is having active issues with GI bleeding and is off blood thinners though she was not these no long term to that. --Discussed limited labs testing to  Ruled out some common considering for VTE - FVL -pending  Prothrombin gene mutation neg, APLA AB- unrevealing. -no clear justification for restarting anticoagulation till her GI bleeding is completely control and then she could be consideration for prophylactic ASA. -discussion approaches to reduce risk factors associated with VTE risk.  2. GI bleeding -recent w/u done 3. Autoimmune hepatitis Plan -continue f/u with  Dr Oletta Lamas for further evaluation and mx of GI bleeding.  4. Iron deficiency Anemia related to GI bleeding. Has needed previous IV Iron and PRBC transfusions. . Lab Results  Component Value Date   IRON 35 (L) 10/03/2017   TIBC 439 10/03/2017   IRONPCTSAT 8 (L) 10/03/2017   (Iron and  TIBC)  Lab Results  Component Value Date   FERRITIN 27 10/03/2017   Plan -discussed need for IV Iron -will setup patient for IV Injectafer 750 weekly x 2 doses  Labs today RTC with Dr Irene Limbo in 60month with labs  All questions were answered. The patient knows to call the clinic with any problems, questions or concerns. I spent 45 minutes counseling the patient face to face. The total time spent in the appointment was 60 minutes and more than 50% was on counseling.  This document serves as a record of services personally performed by GSullivan Lone MD. It was created on his behalf by SBaldwin [place] a trained medical scribe. The creation of this record is based on the scribe's personal observations and the provider's statements to them.   .I have reviewed the above documentation for accuracy and completeness, and I agree with the above. .Brunetta GeneraMD MS

## 2017-10-03 ENCOUNTER — Inpatient Hospital Stay: Payer: Medicare HMO | Attending: Hematology | Admitting: Hematology

## 2017-10-03 ENCOUNTER — Telehealth: Payer: Self-pay | Admitting: Hematology

## 2017-10-03 ENCOUNTER — Inpatient Hospital Stay: Payer: Medicare HMO

## 2017-10-03 VITALS — BP 130/50 | HR 84 | Temp 97.7°F | Resp 18 | Ht 66.0 in | Wt 223.9 lb

## 2017-10-03 DIAGNOSIS — R300 Dysuria: Secondary | ICD-10-CM | POA: Insufficient documentation

## 2017-10-03 DIAGNOSIS — Z79899 Other long term (current) drug therapy: Secondary | ICD-10-CM | POA: Diagnosis not present

## 2017-10-03 DIAGNOSIS — D6859 Other primary thrombophilia: Secondary | ICD-10-CM | POA: Diagnosis not present

## 2017-10-03 DIAGNOSIS — J449 Chronic obstructive pulmonary disease, unspecified: Secondary | ICD-10-CM | POA: Diagnosis not present

## 2017-10-03 DIAGNOSIS — Z87891 Personal history of nicotine dependence: Secondary | ICD-10-CM | POA: Diagnosis not present

## 2017-10-03 DIAGNOSIS — Z86711 Personal history of pulmonary embolism: Secondary | ICD-10-CM | POA: Diagnosis not present

## 2017-10-03 DIAGNOSIS — M1990 Unspecified osteoarthritis, unspecified site: Secondary | ICD-10-CM

## 2017-10-03 DIAGNOSIS — K589 Irritable bowel syndrome without diarrhea: Secondary | ICD-10-CM | POA: Diagnosis not present

## 2017-10-03 DIAGNOSIS — R42 Dizziness and giddiness: Secondary | ICD-10-CM | POA: Diagnosis not present

## 2017-10-03 DIAGNOSIS — G4700 Insomnia, unspecified: Secondary | ICD-10-CM | POA: Diagnosis not present

## 2017-10-03 DIAGNOSIS — I2699 Other pulmonary embolism without acute cor pulmonale: Secondary | ICD-10-CM

## 2017-10-03 DIAGNOSIS — K754 Autoimmune hepatitis: Secondary | ICD-10-CM | POA: Diagnosis not present

## 2017-10-03 DIAGNOSIS — D500 Iron deficiency anemia secondary to blood loss (chronic): Secondary | ICD-10-CM

## 2017-10-03 DIAGNOSIS — K921 Melena: Secondary | ICD-10-CM | POA: Diagnosis not present

## 2017-10-03 DIAGNOSIS — E785 Hyperlipidemia, unspecified: Secondary | ICD-10-CM | POA: Diagnosis not present

## 2017-10-03 DIAGNOSIS — F419 Anxiety disorder, unspecified: Secondary | ICD-10-CM

## 2017-10-03 DIAGNOSIS — Z9049 Acquired absence of other specified parts of digestive tract: Secondary | ICD-10-CM

## 2017-10-03 DIAGNOSIS — E559 Vitamin D deficiency, unspecified: Secondary | ICD-10-CM | POA: Insufficient documentation

## 2017-10-03 LAB — CMP (CANCER CENTER ONLY)
ALT: 52 U/L (ref 0–55)
AST: 66 U/L — ABNORMAL HIGH (ref 5–34)
Albumin: 3.4 g/dL — ABNORMAL LOW (ref 3.5–5.0)
Alkaline Phosphatase: 143 U/L (ref 40–150)
Anion gap: 10 (ref 3–11)
BUN: 8 mg/dL (ref 7–26)
CO2: 25 mmol/L (ref 22–29)
Calcium: 8.8 mg/dL (ref 8.4–10.4)
Chloride: 106 mmol/L (ref 98–109)
Creatinine: 0.73 mg/dL (ref 0.60–1.10)
GFR, Est AFR Am: >60 mL/min (ref >60)
GFR, Estimated: >60 mL/min (ref >60)
Glucose, Bld: 143 mg/dL — ABNORMAL HIGH (ref 70–140)
Potassium: 3.7 mmol/L (ref 3.5–5.1)
Sodium: 141 mmol/L (ref 136–145)
Total Bilirubin: 1.7 mg/dL — ABNORMAL HIGH (ref 0.2–1.2)
Total Protein: 6.3 g/dL — ABNORMAL LOW (ref 6.4–8.3)

## 2017-10-03 LAB — CBC WITH DIFFERENTIAL (CANCER CENTER ONLY)
Basophils Absolute: 0 10*3/uL (ref 0.0–0.1)
Basophils Relative: 0 %
Eosinophils Absolute: 0 10*3/uL (ref 0.0–0.5)
Eosinophils Relative: 1 %
HCT: 35.1 % (ref 34.8–46.6)
Hemoglobin: 10.7 g/dL — ABNORMAL LOW (ref 11.6–15.9)
Lymphocytes Relative: 16 %
Lymphs Abs: 0.8 10*3/uL — ABNORMAL LOW (ref 0.9–3.3)
MCH: 24.6 pg — ABNORMAL LOW (ref 25.1–34.0)
MCHC: 30.5 g/dL — ABNORMAL LOW (ref 31.5–36.0)
MCV: 80.7 fL (ref 79.5–101.0)
Monocytes Absolute: 0.2 10*3/uL (ref 0.1–0.9)
Monocytes Relative: 3 %
Neutro Abs: 4.1 10*3/uL (ref 1.5–6.5)
Neutrophils Relative %: 80 %
Platelet Count: 140 10*3/uL — ABNORMAL LOW (ref 145–400)
RBC: 4.35 MIL/uL (ref 3.70–5.45)
RDW: 14.2 % (ref 11.2–14.5)
WBC Count: 5.2 10*3/uL (ref 3.9–10.3)
nRBC: 0 /100 WBC

## 2017-10-03 LAB — IRON AND TIBC
Iron: 35 ug/dL — ABNORMAL LOW (ref 41–142)
Saturation Ratios: 8 % — ABNORMAL LOW (ref 21–57)
TIBC: 439 ug/dL (ref 236–444)
UIBC: 404 ug/dL

## 2017-10-03 LAB — RETICULOCYTES
RBC.: 4.35 MIL/uL (ref 3.70–5.45)
Retic Count, Absolute: 117.5 10*3/uL — ABNORMAL HIGH (ref 33.7–90.7)
Retic Ct Pct: 2.7 % — ABNORMAL HIGH (ref 0.7–2.1)

## 2017-10-03 LAB — FERRITIN: Ferritin: 27 ng/mL (ref 9–269)

## 2017-10-03 NOTE — Telephone Encounter (Signed)
Scheduled appt per 2/13 los - gave patient aVS and calender per los.

## 2017-10-04 LAB — LUPUS ANTICOAGULANT PANEL
DRVVT: 42.7 s (ref 0.0–47.0)
PTT Lupus Anticoagulant: 37.6 s (ref 0.0–51.9)

## 2017-10-05 LAB — CARDIOLIPIN ANTIBODIES, IGG, IGM, IGA
Anticardiolipin IgA: <9 APL U/mL (ref 0–11)
Anticardiolipin IgG: <9 GPL U/mL (ref 0–14)
Anticardiolipin IgM: 18 MPL U/mL — ABNORMAL HIGH (ref 0–12)

## 2017-10-05 LAB — BETA-2-GLYCOPROTEIN I ABS, IGG/M/A
Beta-2 Glyco I IgG: <9 GPI IgG units (ref 0–20)
Beta-2-Glycoprotein I IgA: <9 GPI IgA units (ref 0–25)
Beta-2-Glycoprotein I IgM: 11 GPI IgM units (ref 0–32)

## 2017-10-08 LAB — PROTHROMBIN GENE MUTATION

## 2017-10-10 ENCOUNTER — Telehealth: Payer: Self-pay | Admitting: *Deleted

## 2017-10-10 DIAGNOSIS — D500 Iron deficiency anemia secondary to blood loss (chronic): Secondary | ICD-10-CM | POA: Insufficient documentation

## 2017-10-10 LAB — FACTOR 5 LEIDEN

## 2017-10-10 NOTE — Telephone Encounter (Signed)
Per staff message, LVM with patient stating Dr. Irene Limbo is recommending 2 doses IV iron.  Scheduling message sent.  Call back number provided for questions/concerns.

## 2017-10-10 NOTE — Telephone Encounter (Signed)
-----   Message from Brunetta Genera, MD sent at 10/10/2017  1:42 AM EST ----- Regarding: IV Iron Hi Dameka Younker, COuld you let Ms Luttrull know that she is still very iron deficient due to GI losses. Would recommend IV Iron weekly x 2 doses (ordered) Plz help schedule if patient agreeable. thx GK

## 2017-10-17 ENCOUNTER — Ambulatory Visit: Payer: Medicare HMO

## 2017-10-17 ENCOUNTER — Ambulatory Visit (HOSPITAL_COMMUNITY)
Admission: RE | Admit: 2017-10-17 | Discharge: 2017-10-17 | Disposition: A | Discharge status: Home / Self Care | Payer: Medicare HMO | Source: Ambulatory Visit | Attending: Hematology | Admitting: Hematology

## 2017-10-17 DIAGNOSIS — D509 Iron deficiency anemia, unspecified: Secondary | ICD-10-CM | POA: Diagnosis not present

## 2017-10-17 MED ORDER — SODIUM CHLORIDE 0.9 % IV SOLN
750.0000 mg | Freq: Once | INTRAVENOUS | Status: AC
  Administered 2017-10-17: 750 mg via INTRAVENOUS
  Filled 2017-10-17: qty 15

## 2017-10-17 NOTE — Discharge Instructions (Signed)
Ferric carboxymaltose injection What is this medicine? FERRIC CARBOXYMALTOSE (ferr-ik car-box-ee-mol-toes) is an iron complex. Iron is used to make healthy red blood cells, which carry oxygen and nutrients throughout the body. This medicine is used to treat anemia in people with chronic kidney disease or people who cannot take iron by mouth. This medicine may be used for other purposes; ask your health care provider or pharmacist if you have questions. COMMON BRAND NAME(S): Injectafer What should I tell my health care provider before I take this medicine? They need to know if you have any of these conditions: -anemia not caused by low iron levels -high levels of iron in the blood -liver disease -an unusual or allergic reaction to iron, other medicines, foods, dyes, or preservatives -pregnant or trying to get pregnant -breast-feeding How should I use this medicine? This medicine is for infusion into a vein. It is given by a health care professional in a hospital or clinic setting. Talk to your pediatrician regarding the use of this medicine in children. Special care may be needed. Overdosage: If you think you have taken too much of this medicine contact a poison control center or emergency room at once. NOTE: This medicine is only for you. Do not share this medicine with others. What if I miss a dose? It is important not to miss your dose. Call your doctor or health care professional if you are unable to keep an appointment. What may interact with this medicine? Do not take this medicine with any of the following medications: -deferoxamine -dimercaprol -other iron products This medicine may also interact with the following medications: -chloramphenicol -deferasirox This list may not describe all possible interactions. Give your health care provider a list of all the medicines, herbs, non-prescription drugs, or dietary supplements you use. Also tell them if you smoke, drink alcohol, or use  illegal drugs. Some items may interact with your medicine. What should I watch for while using this medicine? Visit your doctor or health care professional regularly. Tell your doctor if your symptoms do not start to get better or if they get worse. You may need blood work done while you are taking this medicine. You may need to follow a special diet. Talk to your doctor. Foods that contain iron include: whole grains/cereals, dried fruits, beans, or peas, leafy green vegetables, and organ meats (liver, kidney). What side effects may I notice from receiving this medicine? Side effects that you should report to your doctor or health care professional as soon as possible: -allergic reactions like skin rash, itching or hives, swelling of the face, lips, or tongue -breathing problems -changes in blood pressure -feeling faint or lightheaded, falls -flushing, sweating, or hot feelings Side effects that usually do not require medical attention (report to your doctor or health care professional if they continue or are bothersome): -changes in taste -constipation -dizziness -headache -nausea -pain, redness, or irritation at site where injected -vomiting This list may not describe all possible side effects. Call your doctor for medical advice about side effects. You may report side effects to FDA at 1-800-FDA-1088. Where should I keep my medicine? This drug is given in a hospital or clinic and will not be stored at home. NOTE: This sheet is a summary. It may not cover all possible information. If you have questions about this medicine, talk to your doctor, pharmacist, or health care provider.  2018 Elsevier/Gold Standard (2015-09-09 11:20:47)  

## 2017-10-17 NOTE — Progress Notes (Signed)
Patient received Injectifar via PIV. Tolerated well, vitals stable, discharge instructions given, verbalized understanding. Patient alert, oriented and ambulatory at the time of discharge.

## 2017-10-24 ENCOUNTER — Ambulatory Visit (HOSPITAL_COMMUNITY)
Admission: RE | Admit: 2017-10-24 | Discharge: 2017-10-24 | Disposition: A | Discharge status: Home / Self Care | Payer: Medicare HMO | Source: Ambulatory Visit | Attending: Hematology | Admitting: Hematology

## 2017-10-24 ENCOUNTER — Ambulatory Visit: Payer: Medicare HMO

## 2017-10-24 DIAGNOSIS — D500 Iron deficiency anemia secondary to blood loss (chronic): Secondary | ICD-10-CM | POA: Diagnosis not present

## 2017-10-24 MED ORDER — SODIUM CHLORIDE 0.9 % IV SOLN
750.0000 mg | Freq: Once | INTRAVENOUS | Status: AC
  Administered 2017-10-24: 750 mg via INTRAVENOUS
  Filled 2017-10-24: qty 15

## 2017-10-24 NOTE — Discharge Instructions (Signed)
Ferric carboxymaltose injection What is this medicine? FERRIC CARBOXYMALTOSE (ferr-ik car-box-ee-mol-toes) is an iron complex. Iron is used to make healthy red blood cells, which carry oxygen and nutrients throughout the body. This medicine is used to treat anemia in people with chronic kidney disease or people who cannot take iron by mouth. This medicine may be used for other purposes; ask your health care provider or pharmacist if you have questions. COMMON BRAND NAME(S): Injectafer What should I tell my health care provider before I take this medicine? They need to know if you have any of these conditions: -anemia not caused by low iron levels -high levels of iron in the blood -liver disease -an unusual or allergic reaction to iron, other medicines, foods, dyes, or preservatives -pregnant or trying to get pregnant -breast-feeding How should I use this medicine? This medicine is for infusion into a vein. It is given by a health care professional in a hospital or clinic setting. Talk to your pediatrician regarding the use of this medicine in children. Special care may be needed. Overdosage: If you think you have taken too much of this medicine contact a poison control center or emergency room at once. NOTE: This medicine is only for you. Do not share this medicine with others. What if I miss a dose? It is important not to miss your dose. Call your doctor or health care professional if you are unable to keep an appointment. What may interact with this medicine? Do not take this medicine with any of the following medications: -deferoxamine -dimercaprol -other iron products This medicine may also interact with the following medications: -chloramphenicol -deferasirox This list may not describe all possible interactions. Give your health care provider a list of all the medicines, herbs, non-prescription drugs, or dietary supplements you use. Also tell them if you smoke, drink alcohol, or use  illegal drugs. Some items may interact with your medicine. What should I watch for while using this medicine? Visit your doctor or health care professional regularly. Tell your doctor if your symptoms do not start to get better or if they get worse. You may need blood work done while you are taking this medicine. You may need to follow a special diet. Talk to your doctor. Foods that contain iron include: whole grains/cereals, dried fruits, beans, or peas, leafy green vegetables, and organ meats (liver, kidney). What side effects may I notice from receiving this medicine? Side effects that you should report to your doctor or health care professional as soon as possible: -allergic reactions like skin rash, itching or hives, swelling of the face, lips, or tongue -breathing problems -changes in blood pressure -feeling faint or lightheaded, falls -flushing, sweating, or hot feelings Side effects that usually do not require medical attention (report to your doctor or health care professional if they continue or are bothersome): -changes in taste -constipation -dizziness -headache -nausea -pain, redness, or irritation at site where injected -vomiting This list may not describe all possible side effects. Call your doctor for medical advice about side effects. You may report side effects to FDA at 1-800-FDA-1088. Where should I keep my medicine? This drug is given in a hospital or clinic and will not be stored at home. NOTE: This sheet is a summary. It may not cover all possible information. If you have questions about this medicine, talk to your doctor, pharmacist, or health care provider.  2018 Elsevier/Gold Standard (2015-09-09 11:20:47)  

## 2017-10-24 NOTE — Progress Notes (Addendum)
PATIENT CARE CENTER NOTE  Diagnosis: Iron deficiency anemia due to chronic blood loss  Provider: Dr. Carolyne Fiscal    Procedure: IV Iron infusion (Injectafer)   Note: Patient received infusion of Injectafer. Patient tolerated infusion well with no adverse reaction. Patient monitored 30 minutes after infusion and vitals taken. Discharge instructions given to patient. Patient alert, oriented and ambulatory.

## 2017-11-05 ENCOUNTER — Encounter: Payer: Self-pay | Admitting: Family Medicine

## 2017-11-05 ENCOUNTER — Ambulatory Visit (INDEPENDENT_AMBULATORY_CARE_PROVIDER_SITE_OTHER): Payer: Medicare HMO | Admitting: Family Medicine

## 2017-11-05 VITALS — BP 123/65 | HR 97 | Temp 97.8°F | Ht 66.0 in | Wt 221.0 lb

## 2017-11-05 DIAGNOSIS — J209 Acute bronchitis, unspecified: Secondary | ICD-10-CM | POA: Diagnosis not present

## 2017-11-05 DIAGNOSIS — J440 Chronic obstructive pulmonary disease with acute lower respiratory infection: Secondary | ICD-10-CM

## 2017-11-05 MED ORDER — BENZONATATE 200 MG PO CAPS: 200.0000 mg | ORAL_CAPSULE | Freq: Two times a day (BID) | ORAL | 0 refills | Status: DC | PRN

## 2017-11-05 MED ORDER — AZITHROMYCIN 250 MG PO TABS: ORAL_TABLET | ORAL | 0 refills | Status: DC

## 2017-11-05 NOTE — Progress Notes (Signed)
Subjective: CC: ?sinus infection PCP: Claretta Fraise, MD Kimberly Obrien is a 57 y.o. female presenting to clinic today for:  1. Cold symptoms  Patient was treated for acute bacterial sinusitis with Augmentin in January 2019.  She notes onset of "junky" productive cough on Wednesday evening.  She denies hemoptysis, fevers, chills, nausea, vomiting.  She does report some nonbloody diarrhea.  Reports shortness of breath and wheezing at baseline.  She notes that she has been using her Symbicort and albuterol inhaler intermittently for symptoms.  She reports that she does not use Symbicort regularly because of cost issues.  She is also been using DayQuil and NyQuil with little improvement in symptoms.  She is a former smoker.  Notes decreased appetite but fair fluid intake.  Her grandchild was sick recently, which may have been her sick contact.  ROS: Per HPI  Allergies  Allergen Reactions  . Aciphex [Rabeprazole Sodium] Rash  . Diflucan [Fluconazole] Rash  . Lansoprazole Hives  . Sucralfate Nausea Only  . Metronidazole Other (See Comments)    Dizziness, nausea, dry mouth and some shortness of breath  . Vancomycin Diarrhea  . Claritin [Loratadine] Other (See Comments)    Tired   . Crestor [Rosuvastatin] Other (See Comments)    Elevated blood glucose and abdominal pain  . Doxycycline Rash  . Ibuprofen Other (See Comments)    Upset stomach  . Lipitor [Atorvastatin] Other (See Comments)    Myalgias, leg pain.  Problems with pancreas, sugars went up also.  Santiago Bur [Nitrofurantoin Macrocrystal] Nausea And Vomiting  . Metoprolol Nausea And Vomiting  . Nsaids Other (See Comments)    Upset stomach  . Omeprazole     bloating  . Rabeprazole Rash  . Septra [Sulfamethoxazole-Trimethoprim] Nausea And Vomiting   Past Medical History:  Diagnosis Date  . Anticoagulation monitoring by pharmacist   . Arthritis   . Asthma   . Autoimmune hepatitis ([place])   . Cataract   . COPD  (chronic obstructive pulmonary disease) ([place])   . Hyperlipidemia   . PE (pulmonary embolism)     Current Outpatient Medications:  .  acetaminophen (TYLENOL) 500 MG tablet, Take 1,000 mg by mouth every 6 (six) hours as needed (for pain/headaches.)., Disp: , Rfl:  .  albuterol (PROVENTIL HFA;VENTOLIN HFA) 108 (90 BASE) MCG/ACT inhaler, Inhale into the lungs every 6 (six) hours as needed for wheezing or shortness of breath., Disp: , Rfl:  .  amoxicillin-clavulanate (AUGMENTIN) 875-125 MG tablet, Take 1 tablet by mouth 2 (two) times daily., Disp: 20 tablet, Rfl: 0 .  esomeprazole (NEXIUM) 40 MG capsule, Take 40 mg by mouth daily at 12 noon., Disp: , Rfl:  .  fluticasone (FLONASE) 50 MCG/ACT nasal spray, USE TWO SPRAY(S) IN EACH NOSTRIL ONCE DAILY, Disp: 16 g, Rfl: 5 .  ibuprofen (ADVIL,MOTRIN) 200 MG tablet, Take 200 mg by mouth every 8 (eight) hours as needed (for pain/severe headaches.)., Disp: , Rfl:  .  predniSONE (DELTASONE) 10 MG tablet, Take 10 mg by mouth daily with breakfast. , Disp: , Rfl:  .  Vitamin D, Ergocalciferol, (DRISDOL) 50000 units CAPS capsule, Take 1 capsule (50,000 Units total) by mouth every 7 (seven) days., Disp: 12 capsule, Rfl: 1 Social History   Socioeconomic History  . Marital status: Married    Spouse name: Not on file  . Number of children: 4  . Years of education: Not on file  . Highest education level: Not on file  Social Needs  . Financial  resource strain: Not on file  . Food insecurity - worry: Not on file  . Food insecurity - inability: Not on file  . Transportation needs - medical: Not on file  . Transportation needs - non-medical: Not on file  Occupational History  . Occupation: Unemployed  Tobacco Use  . Smoking status: Former Smoker    Packs/day: 2.00    Years: 50.00    Pack years: 100.00    Types: Cigarettes    Start date: 08/22/1977    Last attempt to quit: 04/21/2012    Years since quitting: 5.5  . Smokeless tobacco: Never Used  Substance and  Sexual Activity  . Alcohol use: No  . Drug use: No  . Sexual activity: Yes  Other Topics Concern  . Not on file  Social History Narrative  . Not on file   Family History  Problem Relation Age of Onset  . Cirrhosis Mother   . Hyperlipidemia Father   . Heart failure Father   . Obesity Daughter   . Hypertension Brother   . Heart attack Neg Hx     Objective: Office vital signs reviewed. BP 123/65   Pulse 97   Temp 97.8 F (36.6 C) (Oral)   Ht 5\' 6"  (1.676 m)   Wt 221 lb (100.2 kg)   SpO2 95%   BMI 35.67 kg/m   Physical Examination:  General: Awake, alert, ill appearing, No acute distress HEENT: Normal    Neck: No masses palpated. No lymphadenopathy    Ears: Tympanic membranes intact, normal light reflex, no erythema, no bulging    Eyes: PERRLA, extraocular membranes intact, sclera white    Nose: nasal turbinates moist, clear nasal discharge    Throat: moist mucus membranes, no erythema, no tonsillar exudate.  Airway is patent Cardio: regular rate and rhythm, S1S2 heard, no murmurs appreciated Pulm: clear to auscultation bilaterally; fair air movement. no wheezes, rhonchi or rales; normal work of breathing on room air  Assessment/ Plan: 57 y.o. female   1. Acute bronchitis with COPD ([place]) Patient is afebrile, nontoxic-appearing with normal vital signs, including oxygen saturation on room air.  Her cardiopulmonary exam was completely unremarkable except for intermittent coughing.  Given her history of COPD, will treat as bronchitis with antibiotics.  Z-Pak prescribed.  Tessalon Perles prescribed.  I do not think that she needs increased steroid at this time, as there was no wheezes noted on today's exam.  However, I did recommend that she regularly use her Symbicort and use albuterol inhaler 2 puffs every 6 hours for the next 2 days and as needed as directed.  Home care injections were reviewed with the patient.  Reasons for return discussed.  Reasons for emergent evaluation  emergency department also reviewed.  Patient was given a single follow-up as needed.   Meds ordered this encounter  Medications  . azithromycin (ZITHROMAX Z-PAK) 250 MG tablet    Sig: As directed    Dispense:  6 tablet    Refill:  0  . benzonatate (TESSALON) 200 MG capsule    Sig: Take 1 capsule (200 mg total) by mouth 2 (two) times daily as needed for cough.    Dispense:  20 capsule    Refill:  [place], DO [place] [phone]

## 2017-11-05 NOTE — Patient Instructions (Signed)

## 2017-11-07 ENCOUNTER — Other Ambulatory Visit: Payer: Self-pay | Admitting: Family Medicine

## 2017-11-07 ENCOUNTER — Telehealth: Payer: Self-pay | Admitting: Family Medicine

## 2017-11-07 MED ORDER — PREDNISONE 20 MG PO TABS: 40.0000 mg | ORAL_TABLET | Freq: Every day | ORAL | 0 refills | Status: AC

## 2017-11-07 NOTE — Telephone Encounter (Signed)
Pt is not any better, she is having bad coughing spells with wheezing at times

## 2017-11-07 NOTE — Telephone Encounter (Signed)
I have sent in a prednisone burst.

## 2017-11-07 NOTE — Progress Notes (Signed)
Please have patient hold her prednisone 10 mg tablets.  I prescribed her a 5-day prednisone burst with prednisone 40 mg daily.  When she is completed the burst, she may resume her 10 mg tablets daily.  Have her follow-up with her pulmonologist or PCP if she has ongoing symptoms.

## 2017-11-08 NOTE — Progress Notes (Signed)
Pt is aware.  

## 2017-11-12 ENCOUNTER — Ambulatory Visit (INDEPENDENT_AMBULATORY_CARE_PROVIDER_SITE_OTHER): Payer: Medicare HMO

## 2017-11-12 ENCOUNTER — Ambulatory Visit (INDEPENDENT_AMBULATORY_CARE_PROVIDER_SITE_OTHER): Payer: Medicare HMO | Admitting: Family

## 2017-11-12 ENCOUNTER — Encounter: Payer: Self-pay | Admitting: Family

## 2017-11-12 VITALS — BP 119/70 | HR 104 | Temp 97.4°F | Ht 66.0 in | Wt 220.0 lb

## 2017-11-12 DIAGNOSIS — R0602 Shortness of breath: Secondary | ICD-10-CM | POA: Diagnosis not present

## 2017-11-12 DIAGNOSIS — R05 Cough: Secondary | ICD-10-CM

## 2017-11-12 DIAGNOSIS — J189 Pneumonia, unspecified organism: Secondary | ICD-10-CM | POA: Diagnosis not present

## 2017-11-12 DIAGNOSIS — J449 Chronic obstructive pulmonary disease, unspecified: Secondary | ICD-10-CM

## 2017-11-12 DIAGNOSIS — J441 Chronic obstructive pulmonary disease with (acute) exacerbation: Secondary | ICD-10-CM | POA: Diagnosis not present

## 2017-11-12 DIAGNOSIS — R059 Cough, unspecified: Secondary | ICD-10-CM

## 2017-11-12 MED ORDER — LEVOFLOXACIN 500 MG PO TABS: 500.0000 mg | ORAL_TABLET | Freq: Every day | ORAL | 0 refills | Status: DC

## 2017-11-12 NOTE — Progress Notes (Signed)
   Subjective:    Patient ID: Kimberly Obrien, female    DOB: [birth year], 57 y.o.   MRN: [id number]  Cough  This is a recurrent problem. The current episode started 1 to 4 weeks ago. The problem has been waxing and waning. The problem occurs every few minutes. The cough is productive of sputum. Associated symptoms include chills, ear pain, a fever, headaches, myalgias, nasal congestion, postnasal drip, rhinorrhea, a sore throat, shortness of breath and wheezing. Pertinent negatives include no ear congestion. She has tried rest and OTC cough suppressant for the symptoms. The treatment provided mild relief.      Review of Systems  Constitutional: Positive for chills and fever.  HENT: Positive for ear pain, postnasal drip, rhinorrhea and sore throat.   Respiratory: Positive for cough, shortness of breath and wheezing.   Musculoskeletal: Positive for myalgias.  Neurological: Positive for headaches.  All other systems reviewed and are negative.      Objective:   Physical Exam  Constitutional: She is oriented to person, place, and time. She appears well-developed and well-nourished. She has a sickly appearance. No distress.  HENT:  Head: Normocephalic and atraumatic.  Right Ear: External ear normal.  Left Ear: External ear normal.  Mouth/Throat: Posterior oropharyngeal erythema present.  Eyes: Pupils are equal, round, and reactive to light.  Neck: Normal range of motion. Neck supple. No thyromegaly present.  Cardiovascular: Normal rate, regular rhythm, normal heart sounds and intact distal pulses.  No murmur heard. Pulmonary/Chest: Effort normal and breath sounds normal. No respiratory distress. She has no wheezes.  Intermittent productive purulent cough  Abdominal: Soft. Bowel sounds are normal. She exhibits no distension. There is no tenderness.  Musculoskeletal: Normal range of motion. She exhibits no edema or tenderness.  Neurological: She is alert and oriented to person, place,  and time. She has normal reflexes. No cranial nerve deficit.  Skin: Skin is warm and dry.  Psychiatric: She has a normal mood and affect. Her behavior is normal. Judgment and thought content normal.  Vitals reviewed.      BP 119/70   Pulse (!) 104   Temp (!) 97.4 F (36.3 C) (Oral)   Ht 5\' 6"  (1.676 m)   Wt 220 lb (99.8 kg)   SpO2 92%   BMI 35.51 kg/m      Assessment & Plan:  1. SOB (shortness of breath) - DG Chest 2 View; Future - levofloxacin (LEVAQUIN) 500 MG tablet; Take 1 tablet (500 mg total) by mouth daily.  Dispense: 7 tablet; Refill: 0  2. Cough - DG Chest 2 View; Future - levofloxacin (LEVAQUIN) 500 MG tablet; Take 1 tablet (500 mg total) by mouth daily.  Dispense: 7 tablet; Refill: 0  3. Community acquired pneumonia, unspecified laterality - levofloxacin (LEVAQUIN) 500 MG tablet; Take 1 tablet (500 mg total) by mouth daily.  Dispense: 7 tablet; Refill: 0  4. Chronic obstructive pulmonary disease, unspecified COPD type ([place])  5. COPD exacerbation ([facility])  Continue prednisone Will start Levaquin Force fluids Rest RTO in 4 weeks or if symptoms do not improve or worsen     Evelina Dun, FNP

## 2017-11-12 NOTE — Patient Instructions (Signed)

## 2017-11-19 ENCOUNTER — Encounter: Payer: Self-pay | Admitting: Family Medicine

## 2017-11-19 ENCOUNTER — Ambulatory Visit (INDEPENDENT_AMBULATORY_CARE_PROVIDER_SITE_OTHER): Payer: Medicare HMO

## 2017-11-19 ENCOUNTER — Ambulatory Visit (INDEPENDENT_AMBULATORY_CARE_PROVIDER_SITE_OTHER): Payer: Medicare HMO | Admitting: Family Medicine

## 2017-11-19 VITALS — BP 110/60 | HR 76 | Temp 97.0°F | Ht 66.0 in | Wt 220.4 lb

## 2017-11-19 DIAGNOSIS — J189 Pneumonia, unspecified organism: Secondary | ICD-10-CM

## 2017-11-19 DIAGNOSIS — R0602 Shortness of breath: Secondary | ICD-10-CM | POA: Diagnosis not present

## 2017-11-19 DIAGNOSIS — R05 Cough: Secondary | ICD-10-CM | POA: Diagnosis not present

## 2017-11-19 MED ORDER — BETAMETHASONE SOD PHOS & ACET 6 (3-3) MG/ML IJ SUSP
6.0000 mg | Freq: Once | INTRAMUSCULAR | Status: AC
  Administered 2017-11-19: 6 mg via INTRAMUSCULAR

## 2017-11-19 MED ORDER — LEVOFLOXACIN 500 MG PO TABS: 500.0000 mg | ORAL_TABLET | Freq: Every day | ORAL | 0 refills | Status: DC

## 2017-11-19 NOTE — Progress Notes (Signed)
Subjective:  Patient ID: Kimberly Obrien, female    DOB: 1960-12-24  Age: 57 y.o. MRN: [id number]  CC: Cough (pt here today c/o cough and not feeling any better )   HPI TAKENYA TRAVAGLINI presents for recheck of pneumonia.  She says that she is still feels short of breath when she just walks around the house just from bedroom to bathroom so that the kitchen etc.  She is coughing profusely.  The sputum is still yellow but it is not quite as prolific as it was last week.  She finished her antibiotic a couple days ago and the cough seems to have rebounded since then.  The Tessalon did not give her very much relief.  Review of her x-ray from last week shows that she had bibasilar infiltrates.  Today she is having some pain on the right at the posterior inferior lung field radiating to the right flank region. Depression screen [facility] 2/9 11/12/2017 08/23/2017 06/26/2017  Decreased Interest 0 0 0  Down, Depressed, Hopeless 0 0 0  PHQ - 2 Score 0 0 0  Altered sleeping - - -  Tired, decreased energy - - -  Change in appetite - - -  Feeling bad or failure about yourself  - - -  Trouble concentrating - - -  Moving slowly or fidgety/restless - - -  Suicidal thoughts - - -  PHQ-9 Score - - -  Some recent data might be hidden    History Areebah has a past medical history of Anticoagulation monitoring by pharmacist, Arthritis, Asthma, Autoimmune hepatitis ([place]), Cataract, COPD (chronic obstructive pulmonary disease) ([place]), Hyperlipidemia, and PE (pulmonary embolism).   She has a past surgical history that includes Tubal ligation; Wrist surgery (Right); Cholecystectomy; Spine surgery; Abdominal hysterectomy; Eye surgery; left and right heart catheterization with coronary angiogram (N/A, 05/13/2014); Incontinence surgery (2009); Cataract extraction, bilateral (Bilateral); Esophagogastroduodenoscopy (egd) with propofol (N/A, 05/07/2017); and Colonoscopy with propofol (N/A, 05/07/2017).   Her family history  includes Cirrhosis in her mother; Heart failure in her father; Hyperlipidemia in her father; Hypertension in her brother; Obesity in her daughter.She reports that she quit smoking about 5 years ago. Her smoking use included cigarettes. She started smoking about 40 years ago. She has a 100.00 pack-year smoking history. She has never used smokeless tobacco. She reports that she does not drink alcohol or use drugs.    ROS Review of Systems  Constitutional: Negative.   HENT: Negative.   Eyes: Negative for visual disturbance.  Respiratory: Positive for cough and shortness of breath.   Cardiovascular: Negative for chest pain and leg swelling.  Gastrointestinal: Positive for abdominal pain. Negative for diarrhea, nausea and vomiting.  Genitourinary: Negative for difficulty urinating.  Musculoskeletal: Positive for back pain. Negative for arthralgias and myalgias.  Neurological: Negative for weakness, numbness and headaches.  Psychiatric/Behavioral: Negative for sleep disturbance.   Chest x-ray reveals stable infiltrates primarily right lower lobe consistent with the area of pain described above. Objective:  BP 110/60   Pulse 76   Temp (!) 97 F (36.1 C) (Oral)   Ht 5\' 6"  (1.676 m)   Wt 220 lb 6 oz (100 kg)   BMI 35.57 kg/m   BP Readings from Last 3 Encounters:  11/19/17 110/60  11/12/17 119/70  11/05/17 123/65    Wt Readings from Last 3 Encounters:  11/19/17 220 lb 6 oz (100 kg)  11/12/17 220 lb (99.8 kg)  11/05/17 221 lb (100.2 kg)     Physical Exam  Constitutional: She is oriented to person, place, and time. She appears well-developed and well-nourished. No distress.  HENT:  Head: Normocephalic and atraumatic.  Eyes: Pupils are equal, round, and reactive to light. Conjunctivae are normal.  Neck: Normal range of motion.  Cardiovascular: Normal rate, regular rhythm and normal heart sounds.  No murmur heard. Pulmonary/Chest: Effort normal. No respiratory distress. She has no  wheezes. She has rales (.  Noted at the right base).  Abdominal: Soft. There is no tenderness.  Musculoskeletal: Normal range of motion.  Neurological: She is alert and oriented to person, place, and time.  Skin: Skin is warm and dry.  Psychiatric: She has a normal mood and affect. Her behavior is normal.      Assessment & Plan:   Barbar was seen today for cough.  Diagnoses and all orders for this visit:  Community acquired pneumonia, unspecified laterality -     DG Chest 2 View; Future -     betamethasone acetate-betamethasone sodium phosphate (CELESTONE) injection 6 mg  SOB (shortness of breath) -     DG Chest 2 View; Future -     betamethasone acetate-betamethasone sodium phosphate (CELESTONE) injection 6 mg  Other orders -     levofloxacin (LEVAQUIN) 500 MG tablet; Take 1 tablet (500 mg total) by mouth daily. For 10 days       I have discontinued Mardene Celeste B. Benak's benzonatate and levofloxacin. I am also having her start on levofloxacin. Additionally, I am having her maintain her albuterol, Vitamin D (Ergocalciferol), ibuprofen, acetaminophen, fluticasone, and predniSONE. We administered betamethasone acetate-betamethasone sodium phosphate.  Allergies as of 11/19/2017      Reactions   Aciphex [rabeprazole Sodium] Rash   Diflucan [fluconazole] Rash   Lansoprazole Hives   Sucralfate Nausea Only   Metronidazole Other (See Comments)   Dizziness, nausea, dry mouth and some shortness of breath   Vancomycin Diarrhea   Claritin [loratadine] Other (See Comments)   Tired   Crestor [rosuvastatin] Other (See Comments)   Elevated blood glucose and abdominal pain   Doxycycline Rash   Ibuprofen Other (See Comments)   Upset stomach   Lipitor [atorvastatin] Other (See Comments)   Myalgias, leg pain.  Problems with pancreas, sugars went up also.   Macrobid [nitrofurantoin Macrocrystal] Nausea And Vomiting   Metoprolol Nausea And Vomiting   Nsaids Other (See Comments)    Upset stomach   Omeprazole    bloating   Rabeprazole Rash   Septra [sulfamethoxazole-trimethoprim] Nausea And Vomiting      Medication List        Accurate as of 11/19/17  7:12 PM. Always use your most recent med list.          acetaminophen 500 MG tablet Commonly known as:  TYLENOL Take 1,000 mg by mouth every 6 (six) hours as needed (for pain/headaches.).   albuterol 108 (90 Base) MCG/ACT inhaler Commonly known as:  PROVENTIL HFA;VENTOLIN HFA Inhale into the lungs every 6 (six) hours as needed for wheezing or shortness of breath.   fluticasone 50 MCG/ACT nasal spray Commonly known as:  FLONASE USE TWO SPRAY(S) IN EACH NOSTRIL ONCE DAILY   ibuprofen 200 MG tablet Commonly known as:  ADVIL,MOTRIN Take 200 mg by mouth every 8 (eight) hours as needed (for pain/severe headaches.).   levofloxacin 500 MG tablet Commonly known as:  LEVAQUIN Take 1 tablet (500 mg total) by mouth daily. For 10 days   predniSONE 10 MG tablet Commonly known as:  DELTASONE   Vitamin D (  Ergocalciferol) 50000 units Caps capsule Commonly known as:  DRISDOL Take 1 capsule (50,000 Units total) by mouth every 7 (seven) days.        Follow-up: Return in about 1 week (around 11/26/2017).  Claretta Fraise, M.D.

## 2017-11-22 NOTE — Progress Notes (Signed)
CONSULT NOTE  Patient Care Team: Claretta Fraise, MD as PCP - General (Family Medicine) Sinda Du, MD as Consulting Physician (Pulmonary Disease) Teena Irani, MD (Inactive) as Consulting Physician (Gastroenterology) [name], MD as Consulting Physician (Cardiology)  CHIEF COMPLAINTS/PURPOSE OF CONSULTATION:  F/u for H/o Pulmonary Embolism  HISTORY OF PRESENTING ILLNESS:   Kimberly Obrien 57 y.o. female is here because of a referral from Dr. 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   The pt reports that she is doing well overall. She reports seeing a Hematologist first after having a PE on her right side, damaging the lower lobe in 1999. She reports having a second in 2003 with 3 small PE in her right upper lobe. She reports having been on estrogen containing birth control in 1999 which she was told likely led to her PE, and was smoking at the time. She reports taking a diet pill with hormones in 2003 when she had her second PE. She denies having genetic testing regarding these Pes. She reports taking Warfarin 6 months after the first PE. She reports having taken Coumadin from 2003 until 3 months ago when she stopped due to her GI bleeding.  She denies having had any other blood clots, through 4 pregnancies and a few surgeries.  She denies a FHx of blood clotting or bleeding problems. She reports being SOB when she is losing lots of blood. She reports having a blood transfusion on 05/07/17, and receiving two iron infusions in the last 4 months. She denies allergic reactions to IV iron, but does note stomach issues and drowsiness for a couple days following IV iron.   Dr. Oletta Lamas has been following her iron levels. She reports using an inhaler.  She reports that Dr. 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   Of note  prior to the patient's visit, pt has had an Abdomen CT completed on 04/19/17 with results revealing Mild splenomegaly.  On 05/07/17 she had a colonoscopy with 4 polyps removed, and non-bleeding internal hemorrhoids and upper endoscopy (05/07/17) with Gastritis. Biopsied. Normal examined duodenum. Blood found in stool.  Most recent lab results (08/30/17) of CBC, CMP is as follows: all values are WNL except for Glucose at 128, Total bilirubin at 1.4, AST at 68, ALT at 64, MCH at 26.4, Plt at 136k, Neut % at 75.5%.   On review of systems, pt reports occasional SOB, blood in the stools, abdominal pains, and denies mouth ulcers, and any other symptoms.   On PMHx the pt reports IBS, Positive hemoccult card, RRR with murmur, abdominal pain, respiratory disease, migraines, vertigo, COPD, arthritis, DJD, abnormal heart rhythm, fatty liver, pulmonary embolism, pulmonary edema, tachycardia, Vit D deficiency, arthralgia, insomnia, fatigue, hyperlipidemia, anxiety, dysuria, and sleep apnea.  On Surgical Hx the pt reports back surgery x4 (1997, 2006, 2006, 2007), tubal ligation (1985), wrist surgery x2 (1995 x2), cholecystectomy (2001), colonoscopy x2 (2007, 2014), EGD x2 (2002, 2017), hysterectomy partial (2009), rt eye cataract (2013), and cardiac catheterization (2015). On Social Hx the pt reports quitting smoking in 04/18/12 after 30 years, no ETOH use, and being occupationally disabled.    INTERVAL HISTORY   Kimberly Obrien is here for a follow up. She presents to the clinic today she has been fighting Pneumonia for the past 1.5 month. She is currently taking Levofloxacin.    She notes her IV iron procedure went  well for her. She responded well as her Hg increased to 14.6 today up from 10.7 prevously. She plans to see GI Dr. Oletta Lamas in a few weeks. She notes she has tried antacids but cause her diarrhea. She notes Nexium is the most tolerable for her. She is currently on 60m Prednisone.   On review of  symptoms, pt notes blood in her stool as black tarry stool in the last week and half. She is not taking blood thinner or NSAIDs. She notes abdominal pain. She notes GI found small vessel in small intestines that may be leaking but is not sure of this is related to her high levels of blood in stool.    MEDICAL HISTORY:  Past Medical History:  Diagnosis Date  . Anticoagulation monitoring by pharmacist   . Arthritis   . Asthma   . Autoimmune hepatitis ([place]   . Cataract   . COPD ([place]ronic obstructive [place] disease) [place]   . Hyperlipidemia   . PE (pulmonary embolism)     SURGICAL HISTORY: Past Surgical History:  Procedure Laterality Date  . ABDOMINAL HYSTERECTOMY    . CATARACT EXTRACTION, BILATERAL Bilateral   . CHOLECYSTECTOMY    . COLONOSCOPY WITH PROPOFOL N/A 05/07/2017   Procedure: COLONOSCOPY WITH PROPOFOL;  Surgeon: ELaurence Spates MD;  Location: [facility];  Service: Endoscopy;  Laterality: N/A;  . ESOPHAGOGASTRODUODENOSCOPY (EGD) WITH PROPOFOL N/A 05/07/2017   Procedure: ESOPHAGOGASTRODUODENOSCOPY (EGD) WITH PROPOFOL;  Surgeon: ELaurence Spates MD;  Location: [facility];  Service: Endoscopy;  Laterality: N/A;  . EYE SURGERY     Cataracts  . INCONTINENCE SURGERY  2009  . LEFT AND RIGHT HEART CATHETERIZATION WITH CORONARY ANGIOGRAM N/A 05/13/2014   Procedure: LEFT AND RIGHT HEART CATHETERIZATION WITH CORONARY ANGIOGRAM;  Surgeon: CBurnell Blanks MD;  [facility];  Service: Cardiovascular;  Laterality: N/A;  . SPINE SURGERY     L5 S1 fusion  . TUBAL LIGATION    . WRIST SURGERY Right    Had surgery twice to shave bone for blood circulation improvement.    SOCIAL HISTORY: Social History   Socioeconomic History  . Marital status: Married    Spouse name: Not on file  . Number of children: 4  . Years of education: Not on file  . Highest education level: Not on file  Occupational History  . Occupation: Unemployed  Social Needs  . Financial resource  strain: Not on file  . Food insecurity:    Worry: Not on file    Inability: Not on file  . Transportation needs:    Medical: Not on file    Non-medical: Not on file  Tobacco Use  . Smoking status: Former Smoker    Packs/day: 2.00    Years: 50.00    Pack years: 100.00    Types: Cigarettes    Start date: 08/22/1977    Last attempt to quit: 04/21/2012    Years since quitting: 5.6  . Smokeless tobacco: Never Used  Substance and Sexual Activity  . Alcohol use: No  . Drug use: No  . Sexual activity: Yes  Lifestyle  . Physical activity:    Days per week: Not on file    Minutes per session: Not on file  . Stress: Not on file  Relationships  . Social connections:    Talks on phone: Not on file    Gets together: Not on file    Attends religious service: Not on file    Active member of club or organization:  Not on file    Attends meetings of clubs or organizations: Not on file    Relationship status: Not on file  . Intimate partner violence:    Fear of current or ex partner: Not on file    Emotionally abused: Not on file    Physically abused: Not on file    Forced sexual activity: Not on file  Other Topics Concern  . Not on file  Social History Narrative  . Not on file    FAMILY HISTORY: Family History  Problem Relation Age of Onset  . Cirrhosis Mother   . Hyperlipidemia Father   . Heart failure Father   . Obesity Daughter   . Hypertension Brother   . Heart attack Neg Hx     ALLERGIES:  is allergic to aciphex [rabeprazole sodium]; diflucan [fluconazole]; lansoprazole; sucralfate; metronidazole; vancomycin; claritin [loratadine]; crestor [rosuvastatin]; doxycycline; ibuprofen; lipitor [atorvastatin]; macrobid [nitrofurantoin macrocrystal]; metoprolol; nsaids; omeprazole; rabeprazole; and septra [sulfamethoxazole-trimethoprim].  MEDICATIONS:  Current Outpatient Medications  Medication Sig Dispense Refill  . acetaminophen (TYLENOL) 500 MG tablet Take 1,000 mg by mouth every  6 (six) hours as needed (for pain/headaches.).    Marland Kitchen albuterol (PROVENTIL HFA;VENTOLIN HFA) 108 (90 BASE) MCG/ACT inhaler Inhale into the lungs every 6 (six) hours as needed for wheezing or shortness of breath.    . fluticasone (FLONASE) 50 MCG/ACT nasal spray USE TWO SPRAY(S) IN EACH NOSTRIL ONCE DAILY 16 g 5  . ibuprofen (ADVIL,MOTRIN) 200 MG tablet Take 200 mg by mouth every 8 (eight) hours as needed (for pain/severe headaches.).    Marland Kitchen levofloxacin (LEVAQUIN) 500 MG tablet Take 1 tablet (500 mg total) by mouth daily. 5 tablet 0  . predniSONE (DELTASONE) 10 MG tablet   5  . Vitamin D, Ergocalciferol, (DRISDOL) 50000 units CAPS capsule Take 1 capsule (50,000 Units total) by mouth every 7 (seven) days. 12 capsule 1   No current facility-administered medications for this visit.     REVIEW OF SYSTEMS:   .10 Point review of Systems was done is negative except as noted above.  PHYSICAL EXAMINATION:  Vitals:   11/27/17 1025  BP: 121/68  Pulse: 82  Resp: 17  Temp: 97.7 F (36.5 C)  SpO2: 92%   Filed Weights   11/27/17 1025  Weight: 219 lb 12.8 oz (99.7 kg)   . GENERAL:alert, in no acute distress and comfortable SKIN: no acute rashes, no significant lesions EYES: conjunctiva are pink and non-injected, sclera anicteric OROPHARYNX: MMM, no exudates, no oropharyngeal erythema or ulceration NECK: supple, no JVD LYMPH:  no palpable lymphadenopathy in the cervical, axillary or inguinal regions LUNGS: clear to auscultation b/l with normal respiratory effort HEART: regular rate & rhythm ABDOMEN:  normoactive bowel sounds , non tender, not distended. Extremity: no pedal edema PSYCH: alert & oriented x 3 with fluent speech NEURO: no focal motor/sensory deficits   LABORATORY DATA:  I have reviewed the data as listed Recent Results (from the past 2160 hour(s))  Beta-2-glycoprotein i abs, IgG/M/A     Status: None   Collection Time: 10/03/17 12:02 PM  Result Value Ref Range   Beta-2 Glyco  I IgG <9 0 - 20 GPI IgG units    Comment: (NOTE) The reference interval reflects a 3SD or 99th percentile interval, which is thought to represent a potentially clinically significant result in accordance with the International Consensus Statement on the classification criteria for definitive antiphospholipid syndrome (APS). J Thromb Haem 2006;4:295-306.    Beta-2-Glycoprotein I IgM 11 0 - 32  GPI IgM units    Comment: (NOTE) The reference interval reflects a 3SD or 99th percentile interval, which is thought to represent a potentially clinically significant result in accordance with the International Consensus Statement on the classification criteria for definitive antiphospholipid syndrome (APS). J Thromb Haem 2006;4:295-306. Performed At: [facility] [place], [place] [id number] Rush Farmer MD [id number]    Beta-2-Glycoprotein I IgA <9 0 - 25 GPI IgA units    Comment: (NOTE) The reference interval reflects a 3SD or 99th percentile interval, which is thought to represent a potentially clinically significant result in accordance with the International Consensus Statement on the classification criteria for definitive antiphospholipid syndrome (APS). J Thromb Haem 2006;4:295-306. Performed at [facility], [place] [address] [place], [place] [address]   Cardiolipin antibodies, IgG, IgM, IgA     Status: Abnormal   Collection Time: 10/03/17 12:02 PM  Result Value Ref Range   Anticardiolipin IgG <9 0 - 14 GPL U/mL    Comment: (NOTE)                          Negative:              <15                          Indeterminate:     15 - 20                          Low-Med Positive: >20 - 80                          High Positive:         >80    Anticardiolipin IgM 18 (H) 0 - 12 MPL U/mL    Comment: (NOTE)                          Negative:              <13                          Indeterminate:     13 - 20                           Low-Med Positive: >20 - 80                          High Positive:         >80    Anticardiolipin IgA <9 0 - 11 APL U/mL    Comment: (NOTE)                          Negative:              <12                          Indeterminate:     12 - 20                          Low-Med Positive: >20 - 80  High Positive:         >80 Performed At: [facility] [place], [place] [id number] Rush Farmer MD [id number] Performed at [facility], [place] [address], [place], [place] [address]   Lupus anticoagulant panel     Status: None   Collection Time: 10/03/17 12:02 PM  Result Value Ref Range   PTT Lupus Anticoagulant 37.6 0.0 - 51.9 sec   DRVVT 42.7 0.0 - 47.0 sec   Lupus Anticoag Interp Comment:     Comment: (NOTE) No lupus anticoagulant was detected. Performed At: [facility] [place], [place] [id number] Rush Farmer MD MN:[id number] Performed at [facility], [place] [address] [place], [place] [address]   Prothrombin gene mutation     Status: None   Collection Time: 10/03/17 12:02 PM  Result Value Ref Range   Recommendations-PTGENE: Comment     Comment: (NOTE) NEGATIVE No mutation identified. Comment: A point mutation (G20210A) in the factor II (prothrombin) gene is the second most common cause of inherited thrombophilia. The incidence of this mutation in the [place] Caucasian population is about 2% and in the [place] American population it is approximately 0.5%. This mutation is rare in the [place] and Native American population. Being heterozygous for a prothrombin mutation increases the risk for developing venous thrombosis about 2 to 3 times above the general population risk. Being homozygous for the prothrombin gene mutation increases the relative risk for venous thrombosis further, although it is not yet known how much further the risk is increased. In  women heterozygous for the prothrombin gene mutation, the use of estrogen containing oral contraceptives increases the relative risk of venous thrombosis about 16 times and the risk of developing cerebral thrombosis is also significantly increased. In pregnancy the pr othrombin gene mutation increases risk for venous thrombosis and may increase risk for stillbirth, placental abruption, pre-eclampsia and fetal growth restriction. If the patient possesses two or more congenital or acquired thrombophilic risk factors, the risk for thrombosis may rise to more than the sum of the risk ratios for the individual mutations. This assay detects only the prothrombin G20210A mutation and does not measure genetic abnormalities elsewhere in the genome. Other thrombotic risk factors may be pursued through systematic clinical laboratory analysis. These factors include the R506Q (Leiden) mutation in the Factor V gene, plasma homocysteine levels, as well as testing for deficiencies of antithrombin III, protein C and protein S. Genetic Counselors are available for health care providers to discuss results at [phone]-GENE [phone]). Methodology: DNA analysis of the Factor II gene was performed by PCR amplification followed by restriction analysis. The di agnostic sensitivity is >99% for both. All the tests must be combined with clinical information for the most accurate interpretation. Molecular-based testing is highly accurate, but as in any laboratory test, diagnostic errors may occur. This test was developed and its performance characteristics determined by [facility]. It has not been cleared or approved by the Food and Drug Administration. Poort SR, et al. Blood. 1996; 91:9166-0600. Varga EA. Circulation. 2004; 459:X77-S14. Mervin Hack, et [place]; 19:700-703. Allison Quarry, PhD, [facility] Ruben Reason, PhD, [facility] Annetta Maw, M.S., PhD, [facility] Alfredo Bach,  PhD, [place] Norva Riffle, PhD, [facility] Earlean Polka, PhD, [facility] Performed At: [facility] [address] [place], [place] [id number] Nechama Guard MD [id number] Performed at [facility], [place] [address], [place], [place] [address]   Factor 5 leiden  Status: None   Collection Time: 10/03/17 12:02 PM  Result Value Ref Range   Recommendations-F5LEID: Comment     Comment: (NOTE) Result:  Negative (no mutation found) Factor V Leiden is a specific mutation (R506Q) in the factor V gene that is associated with an increased risk of venous thrombosis. Factor V Leiden is more resistant to inactivation by activated protein C.  As a result, factor V persists in the circulation leading to a mild hyper- coagulable state.  The Leiden mutation accounts for 90% - 95% of APC resistance.  Factor V Leiden has been reported in patients with deep vein thrombosis, pulmonary embolus, central retinal vein occlusion, cerebral sinus thrombosis and hepatic vein thrombosis. Other risk factors to be considered in the workup for venous thrombosis include the G20210A mutation in the factor II (prothrombin) gene, protein S and C deficiency, and antithrombin deficiencies. Anticardiolipin antibody and lupus anticoagulant analysis may be appropriate for certain patients, as well as homocysteine levels. Contact your local [facility] for information on how to order additi onal testing if desired. **Genetic counselors are available for health care providers to**  discuss results at [phone] [phone]). Methodology: DNA analysis of the Factor V gene was performed by allele-specific PCR. The diagnostic sensitivity and specificity is >99% for both. Molecular-based testing is highly accurate, but as in any laboratory test, diagnostic errors may occur. All test results must be combined with clinical information for the most accurate interpretation. This test was developed and its  performance characteristics determined by [facility]. It has not been cleared or approved by the Food and Drug Administration. References: Voelkerding K (1996).  Clin Lab Med [phone]. Allison Quarry, PhD, [facility] Ruben Reason, PhD, [facility] Annetta Maw, M.S., PhD, [facility] Alfredo Bach, PhD, [facility] Norva Riffle, PhD, [facility] Earlean Polka PhD, [facility] Performed At: [facility] [address] [place], [place] [id number] Nechama Guard MD [id number] Performed at [facility], [place] [address] [place], [place] [address]   Iron and TIBC     Status: Abnormal   Collection Time: 10/03/17 12:02 PM  Result Value Ref Range   Iron 35 (L) 41 - 142 ug/dL   TIBC 439 236 - 444 ug/dL   Saturation Ratios 8 (L) 21 - 57 %   UIBC 404 ug/dL    Comment: Performed at [facility], [place] [address], [place], [place] [address]  Ferritin     Status: None   Collection Time: 10/03/17 12:02 PM  Result Value Ref Range   Ferritin 27 9 - 269 ng/mL    Comment: Performed at [facility], [place] [address] [place], [place] [address]  CMP ([place] only)     Status: Abnormal   Collection Time: 10/03/17 12:02 PM  Result Value Ref Range   Sodium 141 136 - 145 mmol/L   Potassium 3.7 3.5 - 5.1 mmol/L   Chloride 106 98 - 109 mmol/L   CO2 25 22 - 29 mmol/L   Glucose, Bld 143 (H) 70 - 140 mg/dL   BUN 8 7 - 26 mg/dL   Creatinine 0.73 0.60 - 1.10 mg/dL   Calcium 8.8 8.4 - 10.4 mg/dL   Total Protein 6.3 (L) 6.4 - 8.3 g/dL   Albumin 3.4 (L) 3.5 - 5.0 g/dL   AST 66 (H) 5 - 34 U/L   ALT 52 0 - 55 U/L   Alkaline Phosphatase 143 40 - 150 U/L   Total Bilirubin 1.7 (H)  0.2 - 1.2 mg/dL   GFR, Est Non Af Am >60 >60 mL/min   GFR, Est AFR Am >60 >60 mL/min    Comment: (NOTE) The eGFR has been calculated using the CKD EPI equation. This calculation has not been validated in all clinical situations. eGFR's persistently <60 mL/min  signify possible Chronic Kidney Disease.    Anion gap 10 3 - 11    Comment: Performed at [facility], [address], [place], [place] [address]  CBC with Differential ([place] Only)     Status: Abnormal   Collection Time: 10/03/17 12:02 PM  Result Value Ref Range   WBC Count 5.2 3.9 - 10.3 K/uL   RBC 4.35 3.70 - 5.45 MIL/uL   Hemoglobin 10.7 (L) 11.6 - 15.9 g/dL   HCT 35.1 34.8 - 46.6 %   MCV 80.7 79.5 - 101.0 fL   MCH 24.6 (L) 25.1 - 34.0 pg   MCHC 30.5 (L) 31.5 - 36.0 g/dL   RDW 14.2 11.2 - 14.5 %   Platelet Count 140 (L) 145 - 400 K/uL   Neutrophils Relative % 80 %   Neutro Abs 4.1 1.5 - 6.5 K/uL   Lymphocytes Relative 16 %   Lymphs Abs 0.8 (L) 0.9 - 3.3 K/uL   Monocytes Relative 3 %   Monocytes Absolute 0.2 0.1 - 0.9 K/uL   Eosinophils Relative 1 %   Eosinophils Absolute 0.0 0.0 - 0.5 K/uL   Basophils Relative 0 %   Basophils Absolute 0.0 0.0 - 0.1 K/uL   nRBC 0 0 /100 WBC    Comment: Performed at [facility], [place] [address] [place], [place] [address]  Reticulocytes     Status: Abnormal   Collection Time: 10/03/17 12:02 PM  Result Value Ref Range   Retic Ct Pct 2.7 (H) 0.7 - 2.1 %   RBC. 4.35 3.70 - 5.45 MIL/uL   Retic Count, Absolute 117.5 (H) 33.7 - 90.7 K/uL    Comment: Performed at [facility], [place] [address] [place], [place] [address]  Iron and TIBC     Status: None   Collection Time: 11/27/17 10:05 AM  Result Value Ref Range   Iron 114 41 - 142 ug/dL   TIBC 293 236 - 444 ug/dL   Saturation Ratios 39 21 - 57 %   UIBC 179 ug/dL    Comment: Performed at [facility], [place] [address] [place], [place] [address]  Ferritin     Status: None   Collection Time: 11/27/17 10:05 AM  Result Value Ref Range   Ferritin 260 9 - 269 ng/mL    Comment: Performed at [facility], [place] [address], [place], [place] [address]  CBC with Differential  ([place] Only)     Status: Abnormal   Collection Time: 11/27/17 10:05 AM  Result Value Ref Range   WBC Count 8.8 3.9 - 10.3 K/uL   RBC 5.26 3.70 - 5.45 MIL/uL   Hemoglobin 14.6 11.6 - 15.9 g/dL   HCT 45.3 34.8 - 46.6 %   MCV 86.1 79.5 - 101.0 fL   MCH 27.8 25.1 - 34.0 pg   MCHC 32.2 31.5 - 36.0 g/dL   RDW 20.4 (H) 11.2 - 14.5 %   Platelet Count 97 (L) 145 - 400 K/uL   Neutrophils Relative % 73 %   Neutro Abs 6.3 1.5 - 6.5 K/uL   Lymphocytes Relative 19 %  Lymphs Abs 1.7 0.9 - 3.3 K/uL   Monocytes Relative 7 %   Monocytes Absolute 0.6 0.1 - 0.9 K/uL   Eosinophils Relative 1 %   Eosinophils Absolute 0.1 0.0 - 0.5 K/uL   Basophils Relative 0 %   Basophils Absolute 0.0 0.0 - 0.1 K/uL    Comment: Performed at [facility], [place] [address] [place], [place] [address]  Reticulocytes     Status: Abnormal   Collection Time: 11/27/17 10:05 AM  Result Value Ref Range   Retic Ct Pct 2.1 0.7 - 2.1 %   RBC. 5.26 3.70 - 5.45 MIL/uL   Retic Count, Absolute 110.5 (H) 33.7 - 90.7 K/uL    Comment: Performed at [facility], [place] [address] [place], [place] [address]   . Lab Results  Component Value Date   IRON 114 11/27/2017   TIBC 293 11/27/2017   IRONPCTSAT 39 11/27/2017   (Iron and TIBC)  Lab Results  Component Value Date   FERRITIN 260 11/27/2017   . CBC Latest Ref Rng & Units 11/27/2017 10/03/2017 04/30/2017  WBC 3.9 - 10.3 K/uL 8.8 5.2 8.4  Hemoglobin 11.6 - 15.9 g/dL 14.6 10.7(L) 7.7(L)  Hematocrit 34.8 - 46.6 % 45.3 35.1 27.1(L)  Platelets 145 - 400 K/uL 97(L) 140(L) 202      Diagnosis 05/07/17 1. Stomach, biopsy, antrum - REACTIVE GASTROPATHY. - THERE IS NO EVIDENCE OF HELICOBACTER PYLORI, DYSPLASIA OR MALIGNANCY. - SEE COMMENT. 2. Colon, polyp(s), cecal x 2 - TUBULAR ADENOMA(S). - HIGH GRADE DYSPLASIA IS NOT IDENTIFIED. 3. Colon, polyp(s), descending x 2 - TUBULAR ADENOMA(S). - HIGH GRADE DYSPLASIA IS NOT  IDENTIFIED. Microscopic Comment 1. This pattern can be associated with non-steroidal anti-inflammatory drugs (NSAIDS), alcohol or with other causes of chemical gastropathy. Helicobacter pylori are not identified with Warthin-Starry stain.   ASSESSMENT & PLAN:  57 y.o. female with   1. History of recurrent Pulmonary Embolisms Her previous events appears to have been triggered by taking estrogen containing hormones and smoking. No overt Fhx of thrombophilias and no previous VTE with several pregnancies and surgeries - suggest against significant hereditary thrombophilia. Factor V Leiden was negative, Prothrombin gene mutation neg, APLA Ab panel - unrevealing.  PLAN:  -currently patient is having active issues with GI bleeding and is off blood thinners as a result -no clear justification for restarting anticoagulation till her GI bleeding is completely control and then she could be considered for prophylactic ASA. -discussed approaches to reduce risk factors associated with VTE risk.  2. GI bleeding -recent w/u done -05/07/17 Upper Endoscopy and Colonoscopy show gastritis with 4 large colon polyps which were removed which were found to be tubular adenomas.    3. Autoimmune hepatitis -continue f/u with Dr Oletta Lamas for further evaluation and mx of GI bleeding.  4. Iron deficiency Anemia related to GI bleeding. Has needed previous IV Iron and PRBC transfusions. . Lab Results  Component Value Date   IRON 114 11/27/2017   TIBC 293 11/27/2017   IRONPCTSAT 39 11/27/2017   (Iron and TIBC)  Lab Results  Component Value Date   FERRITIN 260 11/27/2017   Plan  -I reviewed her labs, Hg responded well to IV injectafer and increased to 14.6 today.  -Pt is currnetly being treated for Pneumonia with Levofloxacin. Likely the cause of her PLT ct at 97K.  -She still has GI bleeding even off blood thinners. She has had GI work up done with no clear indication of  Major bleeding source corresponding  with her blood loss.  -I discussed possible causes and exacerbations of her GI bleeding. I strongly encourage she see her GI as soon as she can. -We discussed IV Iron as needed with close monitoring of her blood levels as she continue to have blood loss.  -ferritin today adequate with no indication for additional IV iron at this time.   RTC with Dr Irene Limbo in 2 months with labs  All questions were answered. The patient knows to call the clinic with any problems, questions or concerns.  . The total time spent in the appointment was 20 minutes and more than 50% was on counseling and direct patient cares.   This document serves as a record of services personally performed by Sullivan Lone, MD. It was created on his behalf by Joslyn Devon, a trained medical scribe. The creation of this record is based on the scribe's personal observations and the provider's statements to them.    .I have reviewed the above documentation for accuracy and completeness, and I agree with the above. Brunetta Genera MD MS

## 2017-11-26 ENCOUNTER — Ambulatory Visit (INDEPENDENT_AMBULATORY_CARE_PROVIDER_SITE_OTHER): Payer: Medicare HMO | Admitting: Family Medicine

## 2017-11-26 ENCOUNTER — Encounter: Payer: Self-pay | Admitting: Family Medicine

## 2017-11-26 ENCOUNTER — Ambulatory Visit (INDEPENDENT_AMBULATORY_CARE_PROVIDER_SITE_OTHER): Payer: Medicare HMO

## 2017-11-26 VITALS — BP 106/66 | HR 78 | Temp 97.4°F | Ht 66.0 in | Wt 219.5 lb

## 2017-11-26 DIAGNOSIS — J189 Pneumonia, unspecified organism: Secondary | ICD-10-CM

## 2017-11-26 DIAGNOSIS — J441 Chronic obstructive pulmonary disease with (acute) exacerbation: Secondary | ICD-10-CM

## 2017-11-26 MED ORDER — LEVOFLOXACIN 500 MG PO TABS: 500.0000 mg | ORAL_TABLET | Freq: Every day | ORAL | 0 refills | Status: DC

## 2017-11-26 NOTE — Progress Notes (Signed)
Subjective:  Patient ID: UNKNOWN Kimberly Obrien, female    DOB: 09/29/1960  Age: 57 y.o. MRN: [id number]  CC: Follow-up (pt here today for 1 week f/u of pneumonia)   HPI [name] presents for recheck of her pneumonia.  She seems to have finally turned the corner.  She is feeling much better about 75% better.  She still has some cough is much less productive.  She does not have quite her normal energy.  She has some mild dyspnea on exertion but no dyspnea at rest.  Depression screen [facility] 2/9 11/26/2017 11/12/2017 08/23/2017  Decreased Interest 0 0 0  Down, Depressed, Hopeless 0 0 0  PHQ - 2 Score 0 0 0  Altered sleeping - - -  Tired, decreased energy - - -  Change in appetite - - -  Feeling bad or failure about yourself  - - -  Trouble concentrating - - -  Moving slowly or fidgety/restless - - -  Suicidal thoughts - - -  PHQ-9 Score - - -  Some recent data might be hidden    History [name] has a past medical history of Anticoagulation monitoring by pharmacist, Arthritis, Asthma, Autoimmune hepatitis ([place]), Cataract, COPD (chronic obstructive pulmonary disease) ([place]), Hyperlipidemia, and PE (pulmonary embolism).   She has a past surgical history that includes Tubal ligation; Wrist surgery (Right); Cholecystectomy; Spine surgery; Abdominal hysterectomy; Eye surgery; left and right heart catheterization with coronary angiogram (N/A, 05/13/2014); Incontinence surgery (2009); Cataract extraction, bilateral (Bilateral); Esophagogastroduodenoscopy (egd) with propofol (N/A, 05/07/2017); and Colonoscopy with propofol (N/A, 05/07/2017).   Her family history includes Cirrhosis in her mother; Heart failure in her father; Hyperlipidemia in her father; Hypertension in her brother; Obesity in her daughter.She reports that she quit smoking about 5 years ago. Her smoking use included cigarettes. She started smoking about 40 years ago. She has a 100.00 pack-year smoking history. She has never used  smokeless tobacco. She reports that she does not drink alcohol or use drugs.    ROS Review of Systems  Constitutional: Negative.   HENT: Negative.   Eyes: Negative for visual disturbance.  Respiratory: Positive for cough and shortness of breath.   Cardiovascular: Negative for chest pain.  Gastrointestinal: Negative for abdominal pain.  Musculoskeletal: Negative for arthralgias.    Objective:  BP 106/66   Pulse 78   Temp (!) 97.4 F (36.3 C) (Oral)   Ht 5\' 6"  (1.676 m)   Wt 219 lb 8 oz (99.6 kg)   BMI 35.43 kg/m   BP Readings from Last 3 Encounters:  11/26/17 106/66  11/19/17 110/60  11/12/17 119/70    Wt Readings from Last 3 Encounters:  11/26/17 219 lb 8 oz (99.6 kg)  11/19/17 220 lb 6 oz (100 kg)  11/12/17 220 lb (99.8 kg)     Physical Exam  Constitutional: She is oriented to person, place, and time. She appears well-developed and well-nourished. No distress.  HENT:  Head: Normocephalic and atraumatic.  Right Ear: External ear normal.  Left Ear: External ear normal.  Neck: Normal range of motion. Neck supple.  Cardiovascular: Normal rate, regular rhythm and normal heart sounds.  No murmur heard. Pulmonary/Chest: Effort normal and breath sounds normal. No respiratory distress. She has no wheezes. She has no rales.  Abdominal: Soft. There is no tenderness.  Musculoskeletal: Normal range of motion. She exhibits no edema or tenderness.  Neurological: She is alert and oriented to person, place, and time. She exhibits normal muscle tone. Coordination normal.  Skin: Skin is warm and dry.  Psychiatric: She has a normal mood and affect. Her behavior is normal.    Chest x-ray: Diminished left basilar infiltrate.  What slept may represent chronic scarring.  Assessment & Plan:   There are no diagnoses linked to this encounter.     I am having Mardene Celeste B. Munguia maintain her albuterol, Vitamin D (Ergocalciferol), ibuprofen, acetaminophen, fluticasone,  predniSONE, and levofloxacin.  Allergies as of 11/26/2017      Reactions   Aciphex [rabeprazole Sodium] Rash   Diflucan [fluconazole] Rash   Lansoprazole Hives   Sucralfate Nausea Only   Metronidazole Other (See Comments)   Dizziness, nausea, dry mouth and some shortness of breath   Vancomycin Diarrhea   Claritin [loratadine] Other (See Comments)   Tired   Crestor [rosuvastatin] Other (See Comments)   Elevated blood glucose and abdominal pain   Doxycycline Rash   Ibuprofen Other (See Comments)   Upset stomach   Lipitor [atorvastatin] Other (See Comments)   Myalgias, leg pain.  Problems with pancreas, sugars went up also.   Macrobid [nitrofurantoin Macrocrystal] Nausea And Vomiting   Metoprolol Nausea And Vomiting   Nsaids Other (See Comments)   Upset stomach   Omeprazole    bloating   Rabeprazole Rash   Septra [sulfamethoxazole-trimethoprim] Nausea And Vomiting      Medication List        Accurate as of 11/26/17  3:16 PM. Always use your most recent med list.          acetaminophen 500 MG tablet Commonly known as:  TYLENOL Take 1,000 mg by mouth every 6 (six) hours as needed (for pain/headaches.).   albuterol 108 (90 Base) MCG/ACT inhaler Commonly known as:  PROVENTIL HFA;VENTOLIN HFA Inhale into the lungs every 6 (six) hours as needed for wheezing or shortness of breath.   fluticasone 50 MCG/ACT nasal spray Commonly known as:  FLONASE USE TWO SPRAY(S) IN EACH NOSTRIL ONCE DAILY   ibuprofen 200 MG tablet Commonly known as:  ADVIL,MOTRIN Take 200 mg by mouth every 8 (eight) hours as needed (for pain/severe headaches.).   levofloxacin 500 MG tablet Commonly known as:  LEVAQUIN Take 1 tablet (500 mg total) by mouth daily. For 10 days   predniSONE 10 MG tablet Commonly known as:  DELTASONE   Vitamin D (Ergocalciferol) 50000 units Caps capsule Commonly known as:  DRISDOL Take 1 capsule (50,000 Units total) by mouth every 7 (seven) days.        Follow-up: No  follow-ups on file.  Claretta Fraise, M.D.

## 2017-11-27 ENCOUNTER — Encounter: Payer: Self-pay | Admitting: Hematology

## 2017-11-27 ENCOUNTER — Inpatient Hospital Stay: Payer: Medicare HMO | Attending: Hematology | Admitting: Hematology

## 2017-11-27 ENCOUNTER — Telehealth: Payer: Self-pay | Admitting: Hematology

## 2017-11-27 ENCOUNTER — Inpatient Hospital Stay: Payer: Medicare HMO

## 2017-11-27 VITALS — BP 121/68 | HR 82 | Temp 97.7°F | Resp 17 | Ht 66.0 in | Wt 219.8 lb

## 2017-11-27 DIAGNOSIS — I2699 Other pulmonary embolism without acute cor pulmonale: Secondary | ICD-10-CM | POA: Diagnosis not present

## 2017-11-27 DIAGNOSIS — Z87891 Personal history of nicotine dependence: Secondary | ICD-10-CM | POA: Insufficient documentation

## 2017-11-27 DIAGNOSIS — E785 Hyperlipidemia, unspecified: Secondary | ICD-10-CM | POA: Insufficient documentation

## 2017-11-27 DIAGNOSIS — Z86711 Personal history of pulmonary embolism: Secondary | ICD-10-CM | POA: Diagnosis not present

## 2017-11-27 DIAGNOSIS — M1990 Unspecified osteoarthritis, unspecified site: Secondary | ICD-10-CM | POA: Insufficient documentation

## 2017-11-27 DIAGNOSIS — R5383 Other fatigue: Secondary | ICD-10-CM | POA: Insufficient documentation

## 2017-11-27 DIAGNOSIS — Z7952 Long term (current) use of systemic steroids: Secondary | ICD-10-CM | POA: Insufficient documentation

## 2017-11-27 DIAGNOSIS — J449 Chronic obstructive pulmonary disease, unspecified: Secondary | ICD-10-CM | POA: Diagnosis not present

## 2017-11-27 DIAGNOSIS — Z792 Long term (current) use of antibiotics: Secondary | ICD-10-CM | POA: Diagnosis not present

## 2017-11-27 DIAGNOSIS — K921 Melena: Secondary | ICD-10-CM | POA: Insufficient documentation

## 2017-11-27 DIAGNOSIS — Z79899 Other long term (current) drug therapy: Secondary | ICD-10-CM | POA: Diagnosis not present

## 2017-11-27 DIAGNOSIS — J189 Pneumonia, unspecified organism: Secondary | ICD-10-CM | POA: Insufficient documentation

## 2017-11-27 DIAGNOSIS — D500 Iron deficiency anemia secondary to blood loss (chronic): Secondary | ICD-10-CM | POA: Insufficient documentation

## 2017-11-27 DIAGNOSIS — D6859 Other primary thrombophilia: Secondary | ICD-10-CM

## 2017-11-27 DIAGNOSIS — Z8719 Personal history of other diseases of the digestive system: Secondary | ICD-10-CM | POA: Diagnosis not present

## 2017-11-27 LAB — CBC WITH DIFFERENTIAL (CANCER CENTER ONLY)
Basophils Absolute: 0 10*3/uL (ref 0.0–0.1)
Basophils Relative: 0 %
Eosinophils Absolute: 0.1 10*3/uL (ref 0.0–0.5)
Eosinophils Relative: 1 %
HCT: 45.3 % (ref 34.8–46.6)
Hemoglobin: 14.6 g/dL (ref 11.6–15.9)
Lymphocytes Relative: 19 %
Lymphs Abs: 1.7 10*3/uL (ref 0.9–3.3)
MCH: 27.8 pg (ref 25.1–34.0)
MCHC: 32.2 g/dL (ref 31.5–36.0)
MCV: 86.1 fL (ref 79.5–101.0)
Monocytes Absolute: 0.6 10*3/uL (ref 0.1–0.9)
Monocytes Relative: 7 %
Neutro Abs: 6.3 10*3/uL (ref 1.5–6.5)
Neutrophils Relative %: 73 %
Platelet Count: 97 10*3/uL — ABNORMAL LOW (ref 145–400)
RBC: 5.26 MIL/uL (ref 3.70–5.45)
RDW: 20.4 % — ABNORMAL HIGH (ref 11.2–14.5)
WBC Count: 8.8 10*3/uL (ref 3.9–10.3)

## 2017-11-27 LAB — RETICULOCYTES
RBC.: 5.26 MIL/uL (ref 3.70–5.45)
Retic Count, Absolute: 110.5 10*3/uL — ABNORMAL HIGH (ref 33.7–90.7)
Retic Ct Pct: 2.1 % (ref 0.7–2.1)

## 2017-11-27 LAB — FERRITIN: Ferritin: 260 ng/mL (ref 9–269)

## 2017-11-27 LAB — IRON AND TIBC
Iron: 114 ug/dL (ref 41–142)
Saturation Ratios: 39 % (ref 21–57)
TIBC: 293 ug/dL (ref 236–444)
UIBC: 179 ug/dL

## 2017-11-27 NOTE — Telephone Encounter (Signed)
Appt scheduled AVS/Calendar printed per 4/9 los

## 2017-12-10 ENCOUNTER — Ambulatory Visit (INDEPENDENT_AMBULATORY_CARE_PROVIDER_SITE_OTHER): Payer: Medicare HMO | Admitting: Family Medicine

## 2017-12-10 ENCOUNTER — Encounter: Payer: Self-pay | Admitting: Family Medicine

## 2017-12-10 VITALS — BP 108/63 | HR 84 | Temp 97.3°F | Ht 66.0 in | Wt 220.0 lb

## 2017-12-10 DIAGNOSIS — R1030 Lower abdominal pain, unspecified: Secondary | ICD-10-CM | POA: Diagnosis not present

## 2017-12-10 DIAGNOSIS — R109 Unspecified abdominal pain: Secondary | ICD-10-CM | POA: Diagnosis not present

## 2017-12-10 LAB — URINALYSIS
Bilirubin, UA: NEGATIVE
Nitrite, UA: NEGATIVE
Specific Gravity, UA: >1.03 — ABNORMAL HIGH (ref 1.005–1.030)
Urobilinogen, Ur: >8 mg/dL — ABNORMAL HIGH (ref 0.2–1.0)
pH, UA: 6 (ref 5.0–7.5)

## 2017-12-10 MED ORDER — SUCRALFATE 1 G PO TABS: 1.0000 g | ORAL_TABLET | Freq: Two times a day (BID) | ORAL | 1 refills | Status: DC

## 2017-12-10 MED ORDER — CIPROFLOXACIN HCL 500 MG PO TABS: 500.0000 mg | ORAL_TABLET | Freq: Two times a day (BID) | ORAL | 0 refills | Status: DC

## 2017-12-10 MED ORDER — RANITIDINE HCL 300 MG PO TABS: 300.0000 mg | ORAL_TABLET | Freq: Two times a day (BID) | ORAL | 1 refills | Status: DC

## 2017-12-10 NOTE — Progress Notes (Signed)
Subjective:  Patient ID: Kimberly Obrien, female    DOB: 01/09/1961  Age: 57 y.o. MRN: [id number]  CC: Follow-up   HPI Kimberly Obrien presents for recheck of her pneumonia.  She is also having some lower abdominal pressure.  She denies any burning with urination except that her chest at the end of each time she urinates she will feel a bit of pain that is not a burning sensation.  This is been going on for a few weeks.  It is associated with some lower back pain in the flank regions bilaterally.  It is mild to moderate in intensity and intermittent.  With regard to the pneumonia her breathing has returned to normal.  The cough has resolved.  She denies shortness of breath.  Depression screen [facility] 2/9 12/10/2017 11/26/2017 11/12/2017  Decreased Interest 0 0 0  Down, Depressed, Hopeless 0 0 0  PHQ - 2 Score 0 0 0  Altered sleeping - - -  Tired, decreased energy - - -  Change in appetite - - -  Feeling bad or failure about yourself  - - -  Trouble concentrating - - -  Moving slowly or fidgety/restless - - -  Suicidal thoughts - - -  PHQ-9 Score - - -  Some recent data might be hidden    History Lynasia has a past medical history of Anticoagulation monitoring by pharmacist, Arthritis, Asthma, Autoimmune hepatitis ([place]), Cataract, COPD (chronic obstructive pulmonary disease) ([place]), Hyperlipidemia, and PE (pulmonary embolism).   She has a past surgical history that includes Tubal ligation; Wrist surgery (Right); Cholecystectomy; Spine surgery; Abdominal hysterectomy; Eye surgery; left and right heart catheterization with coronary angiogram (N/A, 05/13/2014); Incontinence surgery (2009); Cataract extraction, bilateral (Bilateral); Esophagogastroduodenoscopy (egd) with propofol (N/A, 05/07/2017); and Colonoscopy with propofol (N/A, 05/07/2017).   Her family history includes Cirrhosis in her mother; Heart failure in her father; Hyperlipidemia in her father; Hypertension in her brother;  Obesity in her daughter.She reports that she quit smoking about 5 years ago. Her smoking use included cigarettes. She started smoking about 40 years ago. She has a 100.00 pack-year smoking history. She has never used smokeless tobacco. She reports that she does not drink alcohol or use drugs.    ROS Review of Systems  Constitutional: Negative.  Negative for activity change.  HENT: Negative.   Eyes: Negative for visual disturbance.  Respiratory: Negative for cough, chest tightness, shortness of breath and wheezing.   Cardiovascular: Negative for chest pain and leg swelling.  Gastrointestinal: Positive for abdominal pain.  Genitourinary: Positive for difficulty urinating and flank pain. Negative for hematuria and pelvic pain.  Musculoskeletal: Negative for arthralgias.  Psychiatric/Behavioral: Negative for agitation. The patient is not nervous/anxious.     Objective:  BP 108/63   Pulse 84   Temp (!) 97.3 F (36.3 C) (Oral)   Ht 5\' 6"  (1.676 m)   Wt 220 lb (99.8 kg)   BMI 35.51 kg/m   BP Readings from Last 3 Encounters:  12/10/17 108/63  11/27/17 121/68  11/26/17 106/66    Wt Readings from Last 3 Encounters:  12/10/17 220 lb (99.8 kg)  11/27/17 219 lb 12.8 oz (99.7 kg)  11/26/17 219 lb 8 oz (99.6 kg)     Physical Exam  Constitutional: She is oriented to person, place, and time. She appears well-developed and well-nourished.  HENT:  Head: Normocephalic and atraumatic.  Cardiovascular: Normal rate and regular rhythm.  No murmur heard. Pulmonary/Chest: Effort normal and breath sounds normal.  Abdominal: Soft. Bowel sounds are normal. She exhibits no mass. There is tenderness (LLQ). There is no rebound and no guarding.  Neurological: She is alert and oriented to person, place, and time.  Skin: Skin is warm and dry.  Psychiatric: She has a normal mood and affect. Her behavior is normal.      Assessment & Plan:   Sabriya was seen today for follow-up.  Diagnoses and  all orders for this visit:  Lower abdominal pain -     Urine Culture -     Urinalysis  Other orders -     sucralfate (CARAFATE) 1 g tablet; Take 1 tablet (1 g total) by mouth 2 (two) times daily. -     ranitidine (ZANTAC) 300 MG tablet; Take 1 tablet (300 mg total) by mouth 2 (two) times daily. -     ciprofloxacin (CIPRO) 500 MG tablet; Take 1 tablet (500 mg total) by mouth 2 (two) times daily.       I have discontinued Mayo B. Grell's levofloxacin. I am also having her start on sucralfate, ranitidine, and ciprofloxacin. Additionally, I am having her maintain her albuterol, Vitamin D (Ergocalciferol), ibuprofen, acetaminophen, fluticasone, and predniSONE.  Allergies as of 12/10/2017      Reactions   Aciphex [rabeprazole Sodium] Rash   Diflucan [fluconazole] Rash   Lansoprazole Hives   Sucralfate Nausea Only   Metronidazole Other (See Comments)   Dizziness, nausea, dry mouth and some shortness of breath   Vancomycin Diarrhea   Claritin [loratadine] Other (See Comments)   Tired   Crestor [rosuvastatin] Other (See Comments)   Elevated blood glucose and abdominal pain   Doxycycline Rash   Ibuprofen Other (See Comments)   Upset stomach   Lipitor [atorvastatin] Other (See Comments)   Myalgias, leg pain.  Problems with pancreas, sugars went up also.   Macrobid [nitrofurantoin Macrocrystal] Nausea And Vomiting   Metoprolol Nausea And Vomiting   Nsaids Other (See Comments)   Upset stomach   Omeprazole    bloating   Rabeprazole Rash   Septra [sulfamethoxazole-trimethoprim] Nausea And Vomiting      Medication List        Accurate as of 12/10/17  9:50 PM. Always use your most recent med list.          acetaminophen 500 MG tablet Commonly known as:  TYLENOL Take 1,000 mg by mouth every 6 (six) hours as needed (for pain/headaches.).   albuterol 108 (90 Base) MCG/ACT inhaler Commonly known as:  PROVENTIL HFA;VENTOLIN HFA Inhale into the lungs every 6 (six) hours as  needed for wheezing or shortness of breath.   ciprofloxacin 500 MG tablet Commonly known as:  CIPRO Take 1 tablet (500 mg total) by mouth 2 (two) times daily.   fluticasone 50 MCG/ACT nasal spray Commonly known as:  FLONASE USE TWO SPRAY(S) IN EACH NOSTRIL ONCE DAILY   ibuprofen 200 MG tablet Commonly known as:  ADVIL,MOTRIN Take 200 mg by mouth every 8 (eight) hours as needed (for pain/severe headaches.).   predniSONE 10 MG tablet Commonly known as:  DELTASONE   ranitidine 300 MG tablet Commonly known as:  ZANTAC Take 1 tablet (300 mg total) by mouth 2 (two) times daily.   sucralfate 1 g tablet Commonly known as:  CARAFATE Take 1 tablet (1 g total) by mouth 2 (two) times daily.   Vitamin D (Ergocalciferol) 50000 units Caps capsule Commonly known as:  DRISDOL Take 1 capsule (50,000 Units total) by mouth every 7 (seven) days.  Follow-up: No follow-ups on file.  Claretta Fraise, M.D.

## 2017-12-11 LAB — URINE CULTURE

## 2017-12-20 ENCOUNTER — Encounter: Payer: Self-pay | Admitting: *Deleted

## 2017-12-20 ENCOUNTER — Ambulatory Visit (INDEPENDENT_AMBULATORY_CARE_PROVIDER_SITE_OTHER): Payer: Medicare HMO | Admitting: *Deleted

## 2017-12-20 VITALS — BP 117/66 | HR 73 | Ht 64.0 in | Wt 221.0 lb

## 2017-12-20 DIAGNOSIS — Z0000 Encounter for general adult medical examination without abnormal findings: Secondary | ICD-10-CM

## 2017-12-20 DIAGNOSIS — Z780 Asymptomatic menopausal state: Secondary | ICD-10-CM

## 2017-12-20 DIAGNOSIS — Z7952 Long term (current) use of systemic steroids: Secondary | ICD-10-CM

## 2017-12-20 NOTE — Patient Instructions (Signed)
  Ms. Bumgarner , Thank you for taking time to come for your Medicare Wellness Visit. I appreciate your ongoing commitment to your health goals. Please review the following plan we discussed and let me know if I can assist you in the future.   These are the goals we discussed: Goals    . Exercise 150 min/wk Moderate Activity       This is a list of the screening recommended for you and due dates:  Health Maintenance  Topic Date Due  . DEXA scan (bone density measurement)  09/22/2017  . Flu Shot  03/21/2018  . Mammogram  04/19/2019  . Pap Smear  12/22/2019  . Tetanus Vaccine  08/21/2020  . Colon Cancer Screening  05/08/2027  .  Hepatitis C: One time screening is recommended by Center for Disease Control  (CDC) for  adults born from [birth year] through 1965.   Completed  . HIV Screening  Completed

## 2017-12-20 NOTE — Progress Notes (Addendum)
Subjective:   Kimberly Obrien is a 57 y.o. female who presents for an Initial Medicare Annual Wellness Visit. Kimberly Obrien is married and lives at home with her husband. She is not working due to disability. Her husband works as a Administrator. She has 4 adult children and many grandchildren. She helps out with the grandchildren regularly. Her recent medical history is significant for iron deficiency anemia with blood transfusions. She is seeing a hematologist and has had labwork and imaging studies to determine the cause. GI workup so far has been negative for anything significant enough to cause bleeding. She also has a history of PEs with anticoagulant treatment but due to anemia coumadin has been discontinued.    Review of Systems    Reports that her health is about the same as last year.   GI: iron deficiency anemia. Denies any rectal bleeding or black/tarry stools.   Cardiac Risk Factors include: dyslipidemia;family history of premature cardiovascular disease;obesity (BMI >30kg/m2);sedentary lifestyle;smoking/ tobacco exposure;hypertension  Resp: some SOB with exertion    Objective:    Today's Vitals   12/20/17 1125  BP: 117/66  Pulse: 73  Weight: 221 lb (100.2 kg)  Height: 5' 4"  (1.626 m)   Body mass index is 37.93 kg/m.  Advanced Directives 11/27/2017 05/07/2017 08/09/2016 12/05/2015 07/19/2015 03/09/2015 05/13/2014  Does Patient Have a Medical Advance Directive? No No No No No No No  Would patient like information on creating a medical advance directive? No - Patient declined No - Patient declined Yes (MAU/Ambulatory/Procedural Areas - Information given) No - patient declined information Yes - Educational materials given No - patient declined information No - patient declined information    Current Medications (verified) Outpatient Encounter Medications as of 12/20/2017  Medication Sig  . acetaminophen (TYLENOL) 500 MG tablet Take 1,000 mg by mouth every 6 (six) hours as  needed (for pain/headaches.).  Marland Kitchen albuterol (PROVENTIL HFA;VENTOLIN HFA) 108 (90 BASE) MCG/ACT inhaler Inhale into the lungs every 6 (six) hours as needed for wheezing or shortness of breath.  . ciprofloxacin (CIPRO) 500 MG tablet Take 1 tablet (500 mg total) by mouth 2 (two) times daily.  . fluticasone (FLONASE) 50 MCG/ACT nasal spray USE TWO SPRAY(S) IN EACH NOSTRIL ONCE DAILY  . predniSONE (DELTASONE) 10 MG tablet   . ranitidine (ZANTAC) 300 MG tablet Take 1 tablet (300 mg total) by mouth 2 (two) times daily.  . sucralfate (CARAFATE) 1 g tablet Take 1 tablet (1 g total) by mouth 2 (two) times daily.  . Vitamin D, Ergocalciferol, (DRISDOL) 50000 units CAPS capsule Take 1 capsule (50,000 Units total) by mouth every 7 (seven) days.   No facility-administered encounter medications on file as of 12/20/2017.     Allergies (verified) Aciphex [rabeprazole sodium]; Fluconazole; Lansoprazole; Sucralfate; Vancomycin; Claritin [loratadine]; Crestor [rosuvastatin]; Doxycycline; Ibuprofen; Lipitor [atorvastatin]; Macrobid [nitrofurantoin macrocrystal]; Metoprolol; Metronidazole; Nsaids; Omeprazole; Rabeprazole; and Septra [sulfamethoxazole-trimethoprim]   History: Past Medical History:  Diagnosis Date  . Anticoagulation monitoring by pharmacist   . Arthritis   . Asthma   . Autoimmune hepatitis ([place])   . Cataract   . COPD (chronic obstructive pulmonary disease) ([place])   . Hyperlipidemia   . PE (pulmonary embolism)    Past Surgical History:  Procedure Laterality Date  . ABDOMINAL HYSTERECTOMY    . CATARACT EXTRACTION, BILATERAL Bilateral   . CHOLECYSTECTOMY    . COLONOSCOPY WITH PROPOFOL N/A 05/07/2017   Procedure: COLONOSCOPY WITH PROPOFOL;  Surgeon: Laurence Spates, MD;  Location: [facility] ENDOSCOPY;  Service: Endoscopy;  Laterality: N/A;  . ESOPHAGOGASTRODUODENOSCOPY (EGD) WITH PROPOFOL N/A 05/07/2017   Procedure: ESOPHAGOGASTRODUODENOSCOPY (EGD) WITH PROPOFOL;  Surgeon: Laurence Spates, MD;  Location: [facility]  ENDOSCOPY;  Service: Endoscopy;  Laterality: N/A;  . EYE SURGERY     Cataracts  . INCONTINENCE SURGERY  2009  . LEFT AND RIGHT HEART CATHETERIZATION WITH CORONARY ANGIOGRAM N/A 05/13/2014   Procedure: LEFT AND RIGHT HEART CATHETERIZATION WITH CORONARY ANGIOGRAM;  Surgeon: [name], MD;  Location: [facility] CATH LAB;  Service: Cardiovascular;  Laterality: N/A;  . SPINE SURGERY     L5 S1 fusion  . TUBAL LIGATION    . WRIST SURGERY Right    Had surgery twice to shave bone for blood circulation improvement.   Family History  Problem Relation Age of Onset  . Cirrhosis Mother   . Hyperlipidemia Father   . Heart failure Father   . Bipolar disorder Father   . Obesity Daughter   . Hypertension Brother   . Heart attack Neg Hx   . Miscarriages / Stillbirths Neg Hx    Social History   Socioeconomic History  . Marital status: Married    Spouse name: Not on file  . Number of children: 4  . Years of education: GED  . Highest education level: GED or equivalent  Occupational History  . Occupation: Unemployed  Social Needs  . Financial resource strain: Not hard at all  . Food insecurity:    Worry: Never true    Inability: Never true  . Transportation needs:    Medical: No    Non-medical: No  Tobacco Use  . Smoking status: Former Smoker    Packs/day: 2.00    Years: 50.00    Pack years: 100.00    Types: Cigarettes    Start date: 08/22/1977    Last attempt to quit: 04/21/2012    Years since quitting: 5.6  . Smokeless tobacco: Never Used  Substance and Sexual Activity  . Alcohol use: No  . Drug use: No  . Sexual activity: Yes  Lifestyle  . Physical activity:    Days per week: 0 days    Minutes per session: 0 min  . Stress: To some extent  Relationships  . Social connections:    Talks on phone: More than three times a week    Gets together: More than three times a week    Attends religious service: More than 4 times per year    Active member of club or organization: Yes     Attends meetings of clubs or organizations: More than 4 times per year    Relationship status: Married  Other Topics Concern  . Not on file  Social History Narrative  . Not on file    Tobacco Counseling Counseling given: Not Answered   Clinical Intake:  Pre-visit preparation completed: No  Pain : No/denies pain   Nutritional Status: BMI > 30  Obese  How often do you need to have someone help you when you read instructions, pamphlets, or other written materials from your doctor or pharmacy?: 1 - Never What is the last grade level you completed in school?: GED Information entered by :: Chong Sicilian, RN   Activities of Daily Living In your present state of health, do you have any difficulty performing the following activities: 12/20/2017  Hearing? N  Vision? N  Difficulty concentrating or making decisions? N  Walking or climbing stairs? N  Dressing or bathing? N  Doing errands, shopping? N  Preparing  Food and eating ? N  Using the Toilet? N  In the past six months, have you accidently leaked urine? N  Do you have problems with loss of bowel control? N  Managing your Medications? N  Managing your Finances? N  Housekeeping or managing your Housekeeping? N  Some recent data might be hidden     Immunizations and Health Maintenance  There is no immunization history on file for this patient. Health Maintenance Due  Topic Date Due  . DEXA SCAN  09/22/2017    Patient Care Team: Claretta Fraise, MD as PCP - General (Family Medicine) Sinda Du, MD as Consulting Physician (Pulmonary Disease) Teena Irani, MD (Inactive) as Consulting Physician (Gastroenterology) [name], MD as Consulting Physician (Cardiology)  No ER visits or hospital admissions. Patient did have an endoscopy and colonoscopy and outpatient infusions/transfusions for iron deficiency anemia.    Assessment:   This is a routine wellness examination for Kimberly Obrien.  Hearing/Vision  screen No deficits noted during visit.  Dietary issues and exercise activities discussed: Current Exercise Habits: The patient does not participate in regular exercise at present, Exercise limited by: orthopedic condition(s);respiratory conditions(s)  Goals    . Exercise 150 min/wk Moderate Activity      Depression Screen PHQ 2/9 Scores 12/20/2017 12/10/2017 11/26/2017 11/12/2017 08/23/2017 06/26/2017 05/23/2017  PHQ - 2 Score 0 0 0 0 0 0 0  PHQ- 9 Score - - - - - - -    Fall Risk Fall Risk  12/20/2017 12/10/2017 11/26/2017 11/12/2017 06/26/2017  Falls in the past year? No No No No No  Number falls in past yr: - - - - -  Injury with Fall? - - - - -    Is the patient's home free of loose throw rugs in walkways, pet beds, electrical cords, etc?   yes      Grab bars in the bathroom? no      Handrails on the stairs?   yes      Adequate lighting?   yes    Cognitive Function: MMSE - Mini Mental State Exam 12/20/2017 08/09/2016 07/19/2015  Orientation to time 5 5 5   Orientation to Place 5 5 5   Registration 3 3 3   Attention/ Calculation 5 5 5   Recall 3 3 3   Language- name 2 objects 2 2 2   Language- repeat 1 1 1   Language- follow 3 step command 3 3 3   Language- read & follow direction 1 1 1   Write a sentence 1 1 1   Copy design 1 1 1   Total score 30 30 30   normal exam,      Screening Tests Health Maintenance  Topic Date Due  . DEXA SCAN  09/22/2017  . INFLUENZA VACCINE  03/21/2018  . MAMMOGRAM  04/19/2019  . PAP SMEAR  12/22/2019  . TETANUS/TDAP  08/21/2020  . COLONOSCOPY  05/08/2027  . Hepatitis C Screening  Completed  . HIV Screening  Completed     Cancer Screenings: Lung: Low Dose CT Chest recommended if Age 33-80 years, 30 pack-year currently smoking OR have quit w/in 15years. Patient does qualify. 50 year pack history and quit 6 years ago. Breast: Up to date on Mammogram? Yes   Up to date of Bone Density/Dexa? Yes Colorectal: Yes-2018    Plan:  Keep f/u with PCP and  specialist Report any s/s of anemia or any bleeding Increase activity level as tolerated  I have personally reviewed and noted the following in the patient's chart:   .  Medical and social history . Use of alcohol, tobacco or illicit drugs  . Current medications and supplements . Functional ability and status . Nutritional status . Physical activity . Advanced directives . List of other physicians . Hospitalizations, surgeries, and ER visits in previous 12 months . Vitals . Screenings to include cognitive, depression, and falls . Referrals and appointments  In addition, I have reviewed and discussed with patient certain preventive protocols, quality metrics, and best practice recommendations. A written personalized care plan for preventive services as well as general preventive health recommendations were provided to patient.     Chong Sicilian, RN   12/20/17   I have reviewed and agree with the above AWV documentation.   Mary-Margaret Hassell Done, FNP

## 2017-12-25 ENCOUNTER — Other Ambulatory Visit: Payer: Medicare HMO | Admitting: Family

## 2017-12-25 DIAGNOSIS — Z86711 Personal history of pulmonary embolism: Secondary | ICD-10-CM | POA: Diagnosis not present

## 2017-12-25 DIAGNOSIS — E559 Vitamin D deficiency, unspecified: Secondary | ICD-10-CM | POA: Diagnosis not present

## 2017-12-25 DIAGNOSIS — R197 Diarrhea, unspecified: Secondary | ICD-10-CM | POA: Diagnosis not present

## 2017-12-25 DIAGNOSIS — K754 Autoimmune hepatitis: Secondary | ICD-10-CM | POA: Diagnosis not present

## 2017-12-25 DIAGNOSIS — K921 Melena: Secondary | ICD-10-CM | POA: Diagnosis not present

## 2017-12-26 ENCOUNTER — Telehealth: Payer: Self-pay | Admitting: Family Medicine

## 2017-12-26 NOTE — Telephone Encounter (Signed)
Yes this will work. We will do lab work at her Pap and I will forward to Dr. Livia Snellen.

## 2017-12-26 NOTE — Telephone Encounter (Signed)
Patient went to see [facility] yesterday and her glucose was 151. Wants to be checked for diabetes.  Patient is going to have the office fax over the lab results. Has apt with Christy for pap 5/13 and apt with [facility] 6/10. Can patient get her A1c done when she see's christy and then follow up with Dr. Livia Snellen? There was no sooner apt with Dr. Livia Snellen. Please advise and send back to the pools.

## 2017-12-26 NOTE — Telephone Encounter (Signed)
Patient aware.

## 2017-12-26 NOTE — Telephone Encounter (Signed)
Sounds great, but when seeing Alyse Low, out of respect, ask her what labs she wants to order. Thanks  WS

## 2017-12-31 ENCOUNTER — Ambulatory Visit (INDEPENDENT_AMBULATORY_CARE_PROVIDER_SITE_OTHER): Payer: Medicare HMO

## 2017-12-31 ENCOUNTER — Ambulatory Visit (INDEPENDENT_AMBULATORY_CARE_PROVIDER_SITE_OTHER): Payer: Medicare HMO | Admitting: Family

## 2017-12-31 ENCOUNTER — Encounter: Payer: Self-pay | Admitting: Family

## 2017-12-31 VITALS — BP 109/58 | HR 90 | Temp 97.3°F | Ht 64.0 in | Wt 221.6 lb

## 2017-12-31 DIAGNOSIS — J449 Chronic obstructive pulmonary disease, unspecified: Secondary | ICD-10-CM | POA: Diagnosis not present

## 2017-12-31 DIAGNOSIS — K754 Autoimmune hepatitis: Secondary | ICD-10-CM

## 2017-12-31 DIAGNOSIS — R7309 Other abnormal glucose: Secondary | ICD-10-CM

## 2017-12-31 DIAGNOSIS — Z0000 Encounter for general adult medical examination without abnormal findings: Secondary | ICD-10-CM

## 2017-12-31 DIAGNOSIS — Z01419 Encounter for gynecological examination (general) (routine) without abnormal findings: Secondary | ICD-10-CM

## 2017-12-31 DIAGNOSIS — D500 Iron deficiency anemia secondary to blood loss (chronic): Secondary | ICD-10-CM | POA: Diagnosis not present

## 2017-12-31 DIAGNOSIS — E559 Vitamin D deficiency, unspecified: Secondary | ICD-10-CM | POA: Diagnosis not present

## 2017-12-31 DIAGNOSIS — Z780 Asymptomatic menopausal state: Secondary | ICD-10-CM | POA: Diagnosis not present

## 2017-12-31 DIAGNOSIS — F411 Generalized anxiety disorder: Secondary | ICD-10-CM

## 2017-12-31 DIAGNOSIS — E785 Hyperlipidemia, unspecified: Secondary | ICD-10-CM

## 2017-12-31 DIAGNOSIS — Z86711 Personal history of pulmonary embolism: Secondary | ICD-10-CM | POA: Diagnosis not present

## 2017-12-31 DIAGNOSIS — Z13820 Encounter for screening for osteoporosis: Secondary | ICD-10-CM | POA: Diagnosis not present

## 2017-12-31 LAB — BAYER DCA HB A1C WAIVED: HB A1C (BAYER DCA - WAIVED): 5.5 % (ref <7.0)

## 2017-12-31 NOTE — Progress Notes (Signed)
Subjective:    Patient ID: Kimberly Obrien, female    DOB: 19-Jan-1961, 57 y.o.   MRN: [id number]  Chief Complaint  Patient presents with  . Gynecologic Exam    with pap   PT presents to the office today today for CPE with pap. PT is followed by liver specialists for autoimmune hepatitis every 3 months. States she takes prednisone daily for this and was told the last time she had lab work drawn her glucose was elevated. She is followed by Hemologists every 3 months for anemia.  Anxiety  Presents for follow-up visit. Symptoms include excessive worry, irritability, nervous/anxious behavior and restlessness. Symptoms occur occasionally. The severity of symptoms is moderate.   Her past medical history is significant for anemia.  Hyperlipidemia  This is a chronic problem. The current episode started more than 1 year ago. The problem is uncontrolled. Recent lipid tests were reviewed and are high. Exacerbating diseases include obesity. She is currently on no antihyperlipidemic treatment. The current treatment provides no improvement of lipids.  Anemia  Presents for follow-up visit. Symptoms include malaise/fatigue. There has been no bruising/bleeding easily or light-headedness.    COPD Takes albuterol inhaler three times a week. States she does not smoker and quit 2013.     Review of Systems  Constitutional: Positive for fatigue, irritability and malaise/fatigue.  Neurological: Negative for light-headedness.  Hematological: Does not bruise/bleed easily.  Psychiatric/Behavioral: The patient is nervous/anxious.   All other systems reviewed and are negative.      Objective:   Physical Exam  Constitutional: She is oriented to person, place, and time. She appears well-developed and well-nourished. No distress.  HENT:  Head: Normocephalic and atraumatic.  Right Ear: External ear normal.  Left Ear: External ear normal.  Mouth/Throat: Oropharynx is clear and moist.  Eyes: Pupils are  equal, round, and reactive to light.  Neck: Normal range of motion. Neck supple. No thyromegaly present.  Cardiovascular: Normal rate, regular rhythm, normal heart sounds and intact distal pulses.  No murmur heard. Pulmonary/Chest: Effort normal and breath sounds normal. No respiratory distress. She has no wheezes. Right breast exhibits no inverted nipple, no mass, no nipple discharge, no skin change and no tenderness. Left breast exhibits no inverted nipple, no mass, no nipple discharge, no skin change and no tenderness.  Abdominal: Soft. Bowel sounds are normal. She exhibits no distension. There is no tenderness.  Genitourinary: Vagina normal.  Genitourinary Comments: Bimanual exam- no adnexal masses or tenderness, ovaries nonpalpable   Cervix parous and pink- No discharge   Musculoskeletal: Normal range of motion. She exhibits no edema or tenderness.  Neurological: She is alert and oriented to person, place, and time. She has normal reflexes. No cranial nerve deficit.  Skin: Skin is warm and dry.  Psychiatric: She has a normal mood and affect. Her behavior is normal. Judgment and thought content normal.  Vitals reviewed.     BP (!) 109/58   Pulse 90   Temp (!) 97.3 F (36.3 C) (Oral)   Ht 5' 4" (1.626 m)   Wt 221 lb 9.6 oz (100.5 kg)   BMI 38.04 kg/m      Assessment & Plan:  Kimberly Obrien comes in today with chief complaint of Gynecologic Exam (with pap)   Diagnosis and orders addressed:  1. Chronic obstructive pulmonary disease, unspecified COPD type ([place]) - CMP14+EGFR  2. GAD (generalized anxiety disorder) - CMP14+EGFR  3. Hyperlipidemia, unspecified hyperlipidemia type - CMP14+EGFR  4. Iron deficiency anemia due  to chronic blood loss - CMP14+EGFR  5. Autoimmune hepatitis ([place]) - CMP14+EGFR  6. History of pulmonary embolism - CMP14+EGFR  7. Annual physical exam - Lipid panel - CMP14+EGFR - VITAMIN D 25 Hydroxy (Vit-D Deficiency, Fractures) - TSH -  Bayer DCA Hb A1c Waived - IGP, CtNg, rfx Aptima HPV ASCU  8. Encounter for gynecological examination without abnormal finding - CMP14+EGFR - IGP, CtNg, rfx Aptima HPV ASCU  9. Elevated glucose - CMP14+EGFR - Bayer DCA Hb A1c Waived   Labs pending Health Maintenance reviewed Diet and exercise encouraged  Follow up plan: 6 months    Evelina Dun, FNP

## 2017-12-31 NOTE — Patient Instructions (Signed)

## 2018-01-01 LAB — CMP14+EGFR
ALT: 64 IU/L — ABNORMAL HIGH (ref 0–32)
AST: 67 IU/L — ABNORMAL HIGH (ref 0–40)
Albumin/Globulin Ratio: 1.8 (ref 1.2–2.2)
Albumin: 3.8 g/dL (ref 3.5–5.5)
Alkaline Phosphatase: 145 IU/L — ABNORMAL HIGH (ref 39–117)
BUN/Creatinine Ratio: 10 (ref 9–23)
BUN: 7 mg/dL (ref 6–24)
Bilirubin Total: 1.5 mg/dL — ABNORMAL HIGH (ref 0.0–1.2)
CO2: 23 mmol/L (ref 20–29)
Calcium: 8.8 mg/dL (ref 8.7–10.2)
Chloride: 103 mmol/L (ref 96–106)
Creatinine, Ser: 0.72 mg/dL (ref 0.57–1.00)
GFR calc Af Amer: 108 mL/min/{1.73_m2} (ref >59)
GFR calc non Af Amer: 93 mL/min/{1.73_m2} (ref >59)
Globulin, Total: 2.1 g/dL (ref 1.5–4.5)
Glucose: 178 mg/dL — ABNORMAL HIGH (ref 65–99)
Potassium: 3.3 mmol/L — ABNORMAL LOW (ref 3.5–5.2)
Sodium: 142 mmol/L (ref 134–144)
Total Protein: 5.9 g/dL — ABNORMAL LOW (ref 6.0–8.5)

## 2018-01-01 LAB — LIPID PANEL
Chol/HDL Ratio: 2.9 ratio (ref 0.0–4.4)
Cholesterol, Total: 152 mg/dL (ref 100–199)
HDL: 53 mg/dL (ref >39)
LDL Calculated: 77 mg/dL (ref 0–99)
Triglycerides: 112 mg/dL (ref 0–149)
VLDL Cholesterol Cal: 22 mg/dL (ref 5–40)

## 2018-01-01 LAB — VITAMIN D 25 HYDROXY (VIT D DEFICIENCY, FRACTURES): Vit D, 25-Hydroxy: 18.4 ng/mL — ABNORMAL LOW (ref 30.0–100.0)

## 2018-01-01 LAB — TSH: TSH: 1.28 u[IU]/mL (ref 0.450–4.500)

## 2018-01-02 LAB — IGP, CTNG, RFX APTIMA HPV ASCU
Chlamydia, Nuc. Acid Amp: NEGATIVE
Gonococcus by Nucleic Acid Amp: NEGATIVE
PAP Smear Comment: 0

## 2018-01-03 ENCOUNTER — Other Ambulatory Visit: Payer: Self-pay | Admitting: Family

## 2018-01-03 MED ORDER — POTASSIUM CHLORIDE ER 20 MEQ PO TBCR: 20.0000 meq | EXTENDED_RELEASE_TABLET | Freq: Every day | ORAL | 3 refills | Status: DC

## 2018-01-03 MED ORDER — VITAMIN D (ERGOCALCIFEROL) 1.25 MG (50000 UNIT) PO CAPS: 50000.0000 [IU] | ORAL_CAPSULE | ORAL | 3 refills | Status: DC

## 2018-01-10 ENCOUNTER — Other Ambulatory Visit: Payer: Self-pay | Admitting: Family

## 2018-01-10 DIAGNOSIS — K754 Autoimmune hepatitis: Secondary | ICD-10-CM | POA: Diagnosis not present

## 2018-01-10 DIAGNOSIS — K921 Melena: Secondary | ICD-10-CM | POA: Diagnosis not present

## 2018-01-10 DIAGNOSIS — R197 Diarrhea, unspecified: Secondary | ICD-10-CM | POA: Diagnosis not present

## 2018-01-10 DIAGNOSIS — R748 Abnormal levels of other serum enzymes: Secondary | ICD-10-CM

## 2018-01-10 NOTE — Progress Notes (Signed)
Spoke to Dr. Laurence Spates pt's liver specialists. We will order CBC and CMP and have rechecked in one month.

## 2018-01-17 ENCOUNTER — Telehealth: Payer: Self-pay | Admitting: Hematology

## 2018-01-17 NOTE — Telephone Encounter (Signed)
Returned call to patient re rescheduling appointment with Dr. Irene Limbo. Gave patient new appointment for 6/24 @ 1 pm - date per pateint.

## 2018-01-18 DIAGNOSIS — K754 Autoimmune hepatitis: Secondary | ICD-10-CM | POA: Diagnosis not present

## 2018-01-25 ENCOUNTER — Other Ambulatory Visit: Payer: Medicare HMO

## 2018-01-25 ENCOUNTER — Ambulatory Visit: Payer: Medicare HMO | Admitting: Hematology

## 2018-01-28 ENCOUNTER — Ambulatory Visit: Payer: Medicare HMO | Admitting: Family Medicine

## 2018-02-05 ENCOUNTER — Telehealth: Payer: Self-pay | Admitting: Hematology

## 2018-02-05 NOTE — Telephone Encounter (Signed)
Patient called to reschedule

## 2018-02-11 ENCOUNTER — Ambulatory Visit: Payer: Medicare HMO | Admitting: Hematology

## 2018-02-11 ENCOUNTER — Other Ambulatory Visit: Payer: Medicare HMO

## 2018-02-11 ENCOUNTER — Ambulatory Visit: Payer: Medicare HMO | Admitting: Family Medicine

## 2018-02-15 DIAGNOSIS — K760 Fatty (change of) liver, not elsewhere classified: Secondary | ICD-10-CM | POA: Diagnosis not present

## 2018-02-15 DIAGNOSIS — K754 Autoimmune hepatitis: Secondary | ICD-10-CM | POA: Diagnosis not present

## 2018-02-15 DIAGNOSIS — D509 Iron deficiency anemia, unspecified: Secondary | ICD-10-CM | POA: Diagnosis not present

## 2018-02-15 DIAGNOSIS — D696 Thrombocytopenia, unspecified: Secondary | ICD-10-CM | POA: Diagnosis not present

## 2018-02-20 ENCOUNTER — Other Ambulatory Visit (HOSPITAL_COMMUNITY): Payer: Self-pay | Admitting: Nurse Practitioner

## 2018-02-20 DIAGNOSIS — D696 Thrombocytopenia, unspecified: Secondary | ICD-10-CM

## 2018-02-20 DIAGNOSIS — K760 Fatty (change of) liver, not elsewhere classified: Secondary | ICD-10-CM

## 2018-03-01 ENCOUNTER — Encounter: Payer: Self-pay | Admitting: Family Medicine

## 2018-03-01 DIAGNOSIS — R7309 Other abnormal glucose: Secondary | ICD-10-CM

## 2018-03-01 HISTORY — DX: Other abnormal glucose: R73.09

## 2018-03-05 ENCOUNTER — Telehealth: Payer: Self-pay | Admitting: Hematology

## 2018-03-05 DIAGNOSIS — K754 Autoimmune hepatitis: Secondary | ICD-10-CM | POA: Diagnosis not present

## 2018-03-05 DIAGNOSIS — K580 Irritable bowel syndrome with diarrhea: Secondary | ICD-10-CM | POA: Diagnosis not present

## 2018-03-05 DIAGNOSIS — D509 Iron deficiency anemia, unspecified: Secondary | ICD-10-CM | POA: Diagnosis not present

## 2018-03-05 NOTE — Telephone Encounter (Signed)
Patient called to reschedule

## 2018-03-14 ENCOUNTER — Other Ambulatory Visit: Payer: Self-pay | Admitting: Radiology

## 2018-03-15 ENCOUNTER — Other Ambulatory Visit: Payer: Self-pay

## 2018-03-15 ENCOUNTER — Ambulatory Visit (HOSPITAL_COMMUNITY)
Admission: RE | Admit: 2018-03-15 | Discharge: 2018-03-15 | Disposition: A | Discharge status: Home / Self Care | Payer: Medicare HMO | Source: Ambulatory Visit | Attending: Nurse Practitioner | Admitting: Nurse Practitioner

## 2018-03-15 ENCOUNTER — Other Ambulatory Visit (HOSPITAL_COMMUNITY): Payer: Self-pay | Admitting: Nurse Practitioner

## 2018-03-15 ENCOUNTER — Encounter (HOSPITAL_COMMUNITY): Payer: Self-pay

## 2018-03-15 ENCOUNTER — Inpatient Hospital Stay: Payer: Medicare HMO | Admitting: Hematology

## 2018-03-15 ENCOUNTER — Inpatient Hospital Stay: Payer: Medicare HMO

## 2018-03-15 DIAGNOSIS — Z882 Allergy status to sulfonamides status: Secondary | ICD-10-CM | POA: Insufficient documentation

## 2018-03-15 DIAGNOSIS — R7989 Other specified abnormal findings of blood chemistry: Secondary | ICD-10-CM | POA: Insufficient documentation

## 2018-03-15 DIAGNOSIS — Z86711 Personal history of pulmonary embolism: Secondary | ICD-10-CM | POA: Diagnosis not present

## 2018-03-15 DIAGNOSIS — Z9842 Cataract extraction status, left eye: Secondary | ICD-10-CM | POA: Diagnosis not present

## 2018-03-15 DIAGNOSIS — K760 Fatty (change of) liver, not elsewhere classified: Secondary | ICD-10-CM | POA: Diagnosis not present

## 2018-03-15 DIAGNOSIS — Z881 Allergy status to other antibiotic agents status: Secondary | ICD-10-CM | POA: Diagnosis not present

## 2018-03-15 DIAGNOSIS — M1990 Unspecified osteoarthritis, unspecified site: Secondary | ICD-10-CM | POA: Diagnosis not present

## 2018-03-15 DIAGNOSIS — Z9841 Cataract extraction status, right eye: Secondary | ICD-10-CM | POA: Diagnosis not present

## 2018-03-15 DIAGNOSIS — J449 Chronic obstructive pulmonary disease, unspecified: Secondary | ICD-10-CM | POA: Insufficient documentation

## 2018-03-15 DIAGNOSIS — Z90710 Acquired absence of both cervix and uterus: Secondary | ICD-10-CM | POA: Insufficient documentation

## 2018-03-15 DIAGNOSIS — Z9049 Acquired absence of other specified parts of digestive tract: Secondary | ICD-10-CM | POA: Diagnosis not present

## 2018-03-15 DIAGNOSIS — Z883 Allergy status to other anti-infective agents status: Secondary | ICD-10-CM | POA: Diagnosis not present

## 2018-03-15 DIAGNOSIS — Z886 Allergy status to analgesic agent status: Secondary | ICD-10-CM | POA: Insufficient documentation

## 2018-03-15 DIAGNOSIS — Z955 Presence of coronary angioplasty implant and graft: Secondary | ICD-10-CM | POA: Diagnosis not present

## 2018-03-15 DIAGNOSIS — Z981 Arthrodesis status: Secondary | ICD-10-CM | POA: Insufficient documentation

## 2018-03-15 DIAGNOSIS — H269 Unspecified cataract: Secondary | ICD-10-CM | POA: Diagnosis not present

## 2018-03-15 DIAGNOSIS — D696 Thrombocytopenia, unspecified: Secondary | ICD-10-CM

## 2018-03-15 DIAGNOSIS — Z9889 Other specified postprocedural states: Secondary | ICD-10-CM | POA: Diagnosis not present

## 2018-03-15 DIAGNOSIS — Z7951 Long term (current) use of inhaled steroids: Secondary | ICD-10-CM | POA: Insufficient documentation

## 2018-03-15 DIAGNOSIS — G4730 Sleep apnea, unspecified: Secondary | ICD-10-CM | POA: Insufficient documentation

## 2018-03-15 DIAGNOSIS — K7460 Unspecified cirrhosis of liver: Secondary | ICD-10-CM | POA: Insufficient documentation

## 2018-03-15 DIAGNOSIS — Z8379 Family history of other diseases of the digestive system: Secondary | ICD-10-CM | POA: Insufficient documentation

## 2018-03-15 DIAGNOSIS — Z79899 Other long term (current) drug therapy: Secondary | ICD-10-CM | POA: Insufficient documentation

## 2018-03-15 DIAGNOSIS — E785 Hyperlipidemia, unspecified: Secondary | ICD-10-CM | POA: Diagnosis not present

## 2018-03-15 DIAGNOSIS — K7581 Nonalcoholic steatohepatitis (NASH): Secondary | ICD-10-CM | POA: Diagnosis not present

## 2018-03-15 DIAGNOSIS — Z87891 Personal history of nicotine dependence: Secondary | ICD-10-CM | POA: Insufficient documentation

## 2018-03-15 DIAGNOSIS — Z8249 Family history of ischemic heart disease and other diseases of the circulatory system: Secondary | ICD-10-CM | POA: Insufficient documentation

## 2018-03-15 HISTORY — PX: IR TRANSCATHETER BX: IMG713

## 2018-03-15 HISTORY — DX: Sleep apnea, unspecified: G47.30

## 2018-03-15 HISTORY — DX: Dyspnea, unspecified: R06.00

## 2018-03-15 HISTORY — PX: IR VENOGRAM HEPATIC W HEMODYNAMIC EVALUATION: IMG692

## 2018-03-15 LAB — BASIC METABOLIC PANEL
Anion gap: 9 (ref 5–15)
BUN: 7 mg/dL (ref 6–20)
CO2: 26 mmol/L (ref 22–32)
Calcium: 8.9 mg/dL (ref 8.9–10.3)
Chloride: 109 mmol/L (ref 98–111)
Creatinine, Ser: 0.54 mg/dL (ref 0.44–1.00)
GFR calc Af Amer: >60 mL/min (ref >60)
GFR calc non Af Amer: >60 mL/min (ref >60)
Glucose, Bld: 124 mg/dL — ABNORMAL HIGH (ref 70–99)
Potassium: 3.3 mmol/L — ABNORMAL LOW (ref 3.5–5.1)
Sodium: 144 mmol/L (ref 135–145)

## 2018-03-15 LAB — CBC
HCT: 41.6 % (ref 36.0–46.0)
Hemoglobin: 14.1 g/dL (ref 12.0–15.0)
MCH: 29.5 pg (ref 26.0–34.0)
MCHC: 33.9 g/dL (ref 30.0–36.0)
MCV: 87 fL (ref 78.0–100.0)
Platelets: 114 10*3/uL — ABNORMAL LOW (ref 150–400)
RBC: 4.78 MIL/uL (ref 3.87–5.11)
RDW: 14.2 % (ref 11.5–15.5)
WBC: 6.3 10*3/uL (ref 4.0–10.5)

## 2018-03-15 LAB — PROTIME-INR
INR: 1.18
Prothrombin Time: 14.9 seconds (ref 11.4–15.2)

## 2018-03-15 MED ORDER — IOPAMIDOL (ISOVUE-300) INJECTION 61%
INTRAVENOUS | Status: AC
  Filled 2018-03-15: qty 50

## 2018-03-15 MED ORDER — FENTANYL CITRATE (PF) 100 MCG/2ML IJ SOLN
INTRAMUSCULAR | Status: AC | PRN
  Administered 2018-03-15 (×2): 50 ug via INTRAVENOUS

## 2018-03-15 MED ORDER — LIDOCAINE HCL 1 % IJ SOLN
INTRAMUSCULAR | Status: AC
  Filled 2018-03-15: qty 20

## 2018-03-15 MED ORDER — FLUMAZENIL 0.5 MG/5ML IV SOLN
INTRAVENOUS | Status: AC
  Filled 2018-03-15: qty 5

## 2018-03-15 MED ORDER — IOPAMIDOL (ISOVUE-300) INJECTION 61%: 50.0000 mL | Freq: Once | INTRAVENOUS | Status: DC | PRN

## 2018-03-15 MED ORDER — MIDAZOLAM HCL 2 MG/2ML IJ SOLN
INTRAMUSCULAR | Status: AC | PRN
  Administered 2018-03-15 (×2): 1 mg via INTRAVENOUS

## 2018-03-15 MED ORDER — FENTANYL CITRATE (PF) 100 MCG/2ML IJ SOLN
INTRAMUSCULAR | Status: AC
  Filled 2018-03-15: qty 6

## 2018-03-15 MED ORDER — NALOXONE HCL 0.4 MG/ML IJ SOLN
INTRAMUSCULAR | Status: AC
  Filled 2018-03-15: qty 1

## 2018-03-15 MED ORDER — SODIUM CHLORIDE 0.9 % IV SOLN
INTRAVENOUS | Status: DC
  Administered 2018-03-15: 13:00:00 via INTRAVENOUS

## 2018-03-15 MED ORDER — MIDAZOLAM HCL 2 MG/2ML IJ SOLN
INTRAMUSCULAR | Status: AC
  Filled 2018-03-15: qty 6

## 2018-03-15 NOTE — H&P (Signed)
Chief Complaint: Patient was seen in consultation today for elevated LFTs  Referring Physician(s): Drazek,Dawn  Supervising Physician: Daryll Brod  Patient Status: [facility] - Out-pt  History of Present Illness: Kimberly Obrien is a 57 y.o. female with past medical history of arthritis, asthma, COPD, and hepatitis with mildly elevated ANA, ASMA, splenomegaly, and thrombocytopenia.  Request for transjugular liver biopsy at the request of Roosevelt Locks, NP.   Patient presents to radiology department for procedure today in her usual state of health.  She has been NPO.  She does not take blood thinners.   Past Medical History:  Diagnosis Date  . Anticoagulation monitoring by pharmacist   . Arthritis   . Asthma   . Autoimmune hepatitis ([place])   . Cataract   . COPD (chronic obstructive pulmonary disease) ([place])   . Dyspnea    exertion  . Hyperlipidemia   . PE (pulmonary embolism)   . Sleep apnea     Past Surgical History:  Procedure Laterality Date  . ABDOMINAL HYSTERECTOMY    . CATARACT EXTRACTION, BILATERAL Bilateral   . CHOLECYSTECTOMY    . COLONOSCOPY WITH PROPOFOL N/A 05/07/2017   Procedure: COLONOSCOPY WITH PROPOFOL;  Surgeon: Laurence Spates, MD;  Location: [facility];  Service: Endoscopy;  Laterality: N/A;  . ESOPHAGOGASTRODUODENOSCOPY (EGD) WITH PROPOFOL N/A 05/07/2017   Procedure: ESOPHAGOGASTRODUODENOSCOPY (EGD) WITH PROPOFOL;  Surgeon: Laurence Spates, MD;  Location: [facility];  Service: Endoscopy;  Laterality: N/A;  . EYE SURGERY     Cataracts  . INCONTINENCE SURGERY  2009  . LEFT AND RIGHT HEART CATHETERIZATION WITH CORONARY ANGIOGRAM N/A 05/13/2014   Procedure: LEFT AND RIGHT HEART CATHETERIZATION WITH CORONARY ANGIOGRAM;  Surgeon: [name], MD;  Location: [facility] CATH LAB;  Service: Cardiovascular;  Laterality: N/A;  . SPINE SURGERY     L5 S1 fusion  . TUBAL LIGATION    . WRIST SURGERY Right    Had surgery twice to shave bone for blood  circulation improvement.    Allergies: Aciphex [rabeprazole sodium]; Fluconazole; Lansoprazole; Sucralfate; Vancomycin; Claritin [loratadine]; Crestor [rosuvastatin]; Doxycycline; Ibuprofen; Lipitor [atorvastatin]; Macrobid [nitrofurantoin macrocrystal]; Metoprolol; Metronidazole; Nsaids; Omeprazole; Rabeprazole; and Septra [sulfamethoxazole-trimethoprim]  Medications: Prior to Admission medications   Medication Sig Start Date End Date Taking? Authorizing Provider  acetaminophen (TYLENOL) 500 MG tablet Take 1,000 mg by mouth every 6 (six) hours as needed (for pain/headaches.).   Yes [provider]  albuterol (PROVENTIL HFA;VENTOLIN HFA) 108 (90 BASE) MCG/ACT inhaler Inhale into the lungs every 6 (six) hours as needed for wheezing or shortness of breath.   Yes [provider]  fluticasone (FLONASE) 50 MCG/ACT nasal spray USE TWO SPRAY(S) IN EACH NOSTRIL ONCE DAILY 06/22/17  Yes Claretta Fraise, MD  predniSONE (DELTASONE) 10 MG tablet  11/12/17  Yes [provider]  Vitamin D, Ergocalciferol, (DRISDOL) 50000 units CAPS capsule Take 1 capsule (50,000 Units total) by mouth every 7 (seven) days. 01/03/18  Yes Hawks, Christy A, FNP  Potassium Chloride ER 20 MEQ TBCR Take 20 mEq by mouth daily. 01/03/18   Sharion Balloon, FNP  ranitidine (ZANTAC) 300 MG tablet Take 1 tablet (300 mg total) by mouth 2 (two) times daily. 12/10/17   Claretta Fraise, MD     Family History  Problem Relation Age of Onset  . Cirrhosis Mother   . Hyperlipidemia Father   . Heart failure Father   . Bipolar disorder Father   . Obesity Daughter   . Hypertension Brother   . Heart attack Neg Hx   .  Miscarriages / Stillbirths Neg Hx     Social History   Socioeconomic History  . Marital status: Married    Spouse name: Not on file  . Number of children: 4  . Years of education: GED  . Highest education level: GED or equivalent  Occupational History  . Occupation: Unemployed  Social Needs  .  Financial resource strain: Not hard at all  . Food insecurity:    Worry: Never true    Inability: Never true  . Transportation needs:    Medical: No    Non-medical: No  Tobacco Use  . Smoking status: Former Smoker    Packs/day: 2.00    Years: 50.00    Pack years: 100.00    Types: Cigarettes    Start date: 08/22/1977    Last attempt to quit: 04/21/2012    Years since quitting: 5.9  . Smokeless tobacco: Never Used  Substance and Sexual Activity  . Alcohol use: No  . Drug use: No  . Sexual activity: Yes  Lifestyle  . Physical activity:    Days per week: 0 days    Minutes per session: 0 min  . Stress: To some extent  Relationships  . Social connections:    Talks on phone: More than three times a week    Gets together: More than three times a week    Attends religious service: More than 4 times per year    Active member of club or organization: Yes    Attends meetings of clubs or organizations: More than 4 times per year    Relationship status: Married  Other Topics Concern  . Not on file  Social History Narrative  . Not on file     Review of Systems: A 12 point ROS discussed and pertinent positives are indicated in the HPI above.  All other systems are negative.  Review of Systems  Constitutional: Negative for fatigue and fever.  Respiratory: Negative for cough and shortness of breath.   Cardiovascular: Negative for chest pain.  Gastrointestinal: Negative for abdominal pain.  Musculoskeletal: Negative for back pain.  Psychiatric/Behavioral: Negative for behavioral problems and confusion.    Vital Signs: BP 132/78 (BP Location: Right Arm)   Pulse 74   Temp 98.6 F (37 C) (Oral)   Resp 18   SpO2 92%   Physical Exam  Constitutional: She is oriented to person, place, and time. She appears well-developed. No distress.  Cardiovascular: Normal rate, regular rhythm and normal heart sounds.  Pulmonary/Chest: Effort normal and breath sounds normal. No respiratory  distress.  Abdominal: Soft. She exhibits no distension. There is no tenderness.  Neurological: She is alert and oriented to person, place, and time.  Skin: Skin is warm and dry. She is not diaphoretic.  Psychiatric: She has a normal mood and affect. Her behavior is normal. Judgment and thought content normal.  Nursing note and vitals reviewed.    MD Evaluation Airway: WNL Heart: WNL Abdomen: WNL Chest/ Lungs: WNL ASA  Classification: 3   Imaging: No results found.  Labs:  CBC: Recent Labs    04/30/17 1153 04/30/17 1342 10/03/17 1202 11/27/17 1005  WBC 8.4  --  5.2 8.8  HGB 7.7* 7.7* 10.7* 14.6  HCT 24.8* 27.1* 35.1 45.3  PLT 202  --  140* 97*    COAGS: Recent Labs    04/27/17 1142 03/15/18 1235  INR 2.2* 1.18    BMP: Recent Labs    04/30/17 1153 10/03/17 1202 12/31/17 1255 03/15/18 1235  NA 142 141 142 144  K 3.7 3.7 3.3* 3.3*  CL 104 106 103 109  CO2 26 25 23 26   GLUCOSE 115* 143* 178* 124*  BUN 14 8 7 7   CALCIUM 8.5* 8.8 8.8 8.9  CREATININE 0.87 0.73 0.72 0.54  GFRNONAA 75 >60 93 >60  GFRAA 86 >60 108 >60    LIVER FUNCTION TESTS: Recent Labs    04/30/17 1153 10/03/17 1202 12/31/17 1255  BILITOT 0.5 1.7* 1.5*  AST 51* 66* 67*  ALT 46* 52 64*  ALKPHOS 89 143 145*  PROT 5.6* 6.3* 5.9*  ALBUMIN 3.5 3.4* 3.8    TUMOR MARKERS: No results for input(s): AFPTM, CEA, CA199, CHROMGRNA in the last 8760 hours.  Assessment and Plan: Patient with past medical history of elevated LFTs presents with concern for autoimmune hepatitis and for screen for portal HTN.  IR consulted for transjugular liver biopsy at the request of Roosevelt Locks, NP. Case reviewed by Dr. Annamaria Boots who approves patient for procedure.  Patient presents today in their usual state of health.  She has been NPO and is not currently on blood thinners.   Risks and benefits discussed with the patient including, but not limited to bleeding, infection, damage to adjacent structures or low  yield requiring additional tests.  All of the patient's questions were answered, patient is agreeable to proceed. Consent signed and in chart.  Thank you for this interesting consult.  I greatly enjoyed meeting Kimberly Obrien and look forward to participating in their care.  A copy of this report was sent to the requesting provider on this date.  Electronically Signed: Docia Barrier, PA 03/15/2018, 1:29 PM   I spent a total of  30 Minutes   in face to face in clinical consultation, greater than 50% of which was counseling/coordinating care for elevated LFTs.

## 2018-03-15 NOTE — Discharge Instructions (Signed)
Liver Biopsy, Care After These instructions give you information on caring for yourself after your procedure. Your doctor may also give you more specific instructions. Call your doctor if you have any problems or questions after your procedure. Follow these instructions at home:  Rest at home for 1-2 days or as told by your doctor.  Have someone stay with you for at least 24 hours.  Do not do these things in the first 24 hours: ? Drive. ? Use machinery. ? Take care of other people. ? Sign legal documents. ? Take a bath or shower.  There are many different ways to close and cover a cut (incision). For example, a cut can be closed with stitches, skin glue, or adhesive strips. Follow your doctor's instructions on: ? Taking care of your cut. ? Changing and removing your bandage (dressing). ? Removing whatever was used to close your cut.  Do not drink alcohol in the first week.  Do not lift more than 5 pounds or play contact sports for the first 2 weeks.  Take medicines only as told by your doctor. For 1 week, do not take medicine that has aspirin in it or medicines like ibuprofen.  Get your test results. Contact a doctor if:  A cut bleeds and leaves more than just a small spot of blood.  A cut is red, puffs up (swells), or hurts more than before.  Fluid or something else comes from a cut.  A cut smells bad.  You have a fever or chills. Get help right away if:  You have swelling, bloating, or pain in your belly (abdomen).  You get dizzy or faint.  You have a rash.  You feel sick to your stomach (nauseous) or throw up (vomit).  You have trouble breathing, feel short of breath, or feel faint.  Your chest hurts.  You have problems talking or seeing.  You have trouble balancing or moving your arms or legs. This information is not intended to replace advice given to you by your health care provider. Make sure you discuss any questions you have with your health care  provider. Document Released: 05/16/2008 Document Revised: 01/13/2016 Document Reviewed: 10/03/2013 Elsevier Interactive Patient Education  2018 [employer].   Transjugular , Care After This sheet gives you information about how to care for yourself after your procedure. Your health care provider may also give you more specific instructions. If you have problems or questions, contact your health care provider. What can I expect after the procedure? After the procedure, it is common to have:  Mild stiffness in your neck.  Follow these instructions at home:  Do not drive for 24 hours if you were given a medicine to help you relax (sedative) during your procedure.  You may remove the bandage (dressing) from your neck as told by your health care provider.  Follow any instructions from your health care provider about eating or drinking.  Take over-the-counter and prescription medicines only as told by your health care provider.  Keep all follow-up visits as told by your health care provider. This is important. Contact a health care provider if:  You develop a rash.  You have bleeding from the place in your neck where the needle was inserted. Get help right away if:  You have shortness of breath.  You feel faint or you pass out.  You have chest pain.  You have difficulty breathing.  You have a cough or you cough up blood.  You have weakness.  You have difficulty moving your arms or legs.  You develop balance problems.  You have problems with speech or vision.  You become confused. This information is not intended to replace advice given to you by your health care provider. Make sure you discuss any questions you have with your health care provider. Document Released: 05/28/2013 Document Revised: 05/19/2016 Document Reviewed: 05/19/2016 Elsevier Interactive Patient Education  2018 [employer].    Moderate Conscious Sedation, Adult, Care After These instructions  provide you with information about caring for yourself after your procedure. Your health care provider may also give you more specific instructions. Your treatment has been planned according to current medical practices, but problems sometimes occur. Call your health care provider if you have any problems or questions after your procedure. What can I expect after the procedure? After your procedure, it is common:  To feel sleepy for several hours.  To feel clumsy and have poor balance for several hours.  To have poor judgment for several hours.  To vomit if you eat too soon.  Follow these instructions at home: For at least 24 hours after the procedure:   Do not: ? Participate in activities where you could fall or become injured. ? Drive. ? Use heavy machinery. ? Drink alcohol. ? Take sleeping pills or medicines that cause drowsiness. ? Make important decisions or sign legal documents. ? Take care of children on your own.  Rest. Eating and drinking  Follow the diet recommended by your health care provider.  If you vomit: ? Drink water, juice, or soup when you can drink without vomiting. ? Make sure you have little or no nausea before eating solid foods. General instructions  Have a responsible adult stay with you until you are awake and alert.  Take over-the-counter and prescription medicines only as told by your health care provider.  If you smoke, do not smoke without supervision.  Keep all follow-up visits as told by your health care provider. This is important. Contact a health care provider if:  You keep feeling nauseous or you keep vomiting.  You feel light-headed.  You develop a rash.  You have a fever. Get help right away if:  You have trouble breathing. This information is not intended to replace advice given to you by your health care provider. Make sure you discuss any questions you have with your health care provider. Document Released: 05/28/2013  Document Revised: 01/10/2016 Document Reviewed: 11/27/2015 Elsevier Interactive Patient Education  [employer].

## 2018-03-15 NOTE — Procedures (Signed)
inc'd LFts  Thrombocytopenia  S/p [id number] with hepatic pressures and venogram  No comp Stable Path pending Full report in pacs

## 2018-03-21 NOTE — Progress Notes (Signed)
CONSULT NOTE  Patient Care Team: Claretta Fraise, MD as PCP - General (Family Medicine) Sinda Du, MD as Consulting Physician (Pulmonary Disease) Teena Irani, MD (Inactive) as Consulting Physician (Gastroenterology) [name], MD as Consulting Physician (Cardiology)    CHIEF COMPLAINTS/PURPOSE OF CONSULTATION:  F/u for IDA  HISTORY OF PRESENTING ILLNESS:   Kimberly Obrien 57 y.o. female is here because of a referral from Dr. 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   The pt reports that she is doing well overall. She reports seeing a Hematologist first after having a PE on her right side, damaging the lower lobe in 1999. She reports having a second in 2003 with 3 small PE in her right upper lobe. She reports having been on estrogen containing birth control in 1999 which she was told likely led to her PE, and was smoking at the time. She reports taking a diet pill with hormones in 2003 when she had her second PE. She denies having genetic testing regarding these Pes. She reports taking Warfarin 6 months after the first PE. She reports having taken Coumadin from 2003 until 3 months ago when she stopped due to her GI bleeding.  She denies having had any other blood clots, through 4 pregnancies and a few surgeries.  She denies a FHx of blood clotting or bleeding problems. She reports being SOB when she is losing lots of blood. She reports having a blood transfusion on 05/07/17, and receiving two iron infusions in the last 4 months. She denies allergic reactions to IV iron, but does note stomach issues and drowsiness for a couple days following IV iron.   Dr. Oletta Lamas has been following her iron levels. She reports using an inhaler.  She reports that Dr. 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   Of note prior to the  patient's visit, pt has had an Abdomen CT completed on 04/19/17 with results revealing Mild splenomegaly.  On 05/07/17 she had a colonoscopy with 4 polyps removed, and non-bleeding internal hemorrhoids and upper endoscopy (05/07/17) with Gastritis. Biopsied. Normal examined duodenum. Blood found in stool.  Most recent lab results (08/30/17) of CBC, CMP is as follows: all values are WNL except for Glucose at 128, Total bilirubin at 1.4, AST at 68, ALT at 64, MCH at 26.4, Plt at 136k, Neut % at 75.5%.   On review of systems, pt reports occasional SOB, blood in the stools, abdominal pains, and denies mouth ulcers, and any other symptoms.   On PMHx the pt reports IBS, Positive hemoccult card, RRR with murmur, abdominal pain, respiratory disease, migraines, vertigo, COPD, arthritis, DJD, abnormal heart rhythm, fatty liver, pulmonary embolism, pulmonary edema, tachycardia, Vit D deficiency, arthralgia, insomnia, fatigue, hyperlipidemia, anxiety, dysuria, and sleep apnea.  On Surgical Hx the pt reports back surgery x4 (1997, 2006, 2006, 2007), tubal ligation (1985), wrist surgery x2 (1995 x2), cholecystectomy (2001), colonoscopy x2 (2007, 2014), EGD x2 (2002, 2017), hysterectomy partial (2009), rt eye cataract (2013), and cardiac catheterization (2015). On Social Hx the pt reports quitting smoking in 04/18/12 after 30 years, no ETOH use, and being occupationally disabled.    INTERVAL HISTORY   Kimberly Obrien is here for management and evaluation of her iron deficiency anemia. The patient's last visit with [place] was on 11/27/17. The pt reports that she is doing well overall.   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 She has seen a hepatologist in the interim as well.   The pt notes that her bowel movements have had less blood overall but she continues to have intermittent blood in the stools. She also notes occasional abdominal  swelling accompanied by yellow, liquid stools and painful abdomen.   She notes that she has continued on 8m Prednisone and Vitamin D. She has been on 588mPrednisone for one month, she was previously taking 1096mor a ocuple months, and one month of 4m30m start. She denies any concern for recent infections. She notes that she has previously seen a rheumatologist several years ago, and notes stable joint pains and swellings. She also notes itchy skin rashes which have increased alongside her prednisone taper. She is no longer taking Imuran and is not sure when she stopped taking this.   Lab results today (03/22/18) of CBC w/diff, CMP, and Reticulocytes is as follows: all values are WNL except for PLT at 87k, Retic ct pct at 3.5%, Retic ct abs at 164.9. Ferritin 03/22/18 at 119 Iron/TIBC 03/22/18 shows all values WNL.  On review of systems, pt reports stable energy levels, occasional blood in the stools, eating well, increased itchy skin rashes, increased join pain/swelling, intermittent abdominal pains, and denies concerns for recent infections, and any other symptoms.    MEDICAL HISTORY:  Past Medical History:  Diagnosis Date  . Anticoagulation monitoring by pharmacist   . Arthritis   . Asthma   . Autoimmune hepatitis (HCC)[place]. Cataract   . COPD (chronic obstructive pulmonary disease) (HCC)[place]. Dyspnea    exertion  . Hyperlipidemia   . PE (pulmonary embolism)   . Sleep apnea     SURGICAL HISTORY: Past Surgical History:  Procedure Laterality Date  . ABDOMINAL HYSTERECTOMY    . CATARACT EXTRACTION, BILATERAL Bilateral   . CHOLECYSTECTOMY    . COLONOSCOPY WITH PROPOFOL N/A 05/07/2017   Procedure: COLONOSCOPY WITH PROPOFOL;  Surgeon: EdwaLaurence Spates;  Location: [facility] ENDOSCOPY;  Service: Endoscopy;  Laterality: N/A;  . ESOPHAGOGASTRODUODENOSCOPY (EGD) WITH PROPOFOL N/A 05/07/2017   Procedure: ESOPHAGOGASTRODUODENOSCOPY (EGD) WITH PROPOFOL;  Surgeon: EdwaLaurence Spates;  Location: [facility]  ENDOSCOPY;  Service: Endoscopy;  Laterality: N/A;  . EYE SURGERY     Cataracts  . INCONTINENCE SURGERY  2009  . IR TRANSCATHETER BX  03/15/2018  . IR VENOGRAM HEPATIC W HEMODYNAMIC EVALUATION  03/15/2018  . LEFT AND RIGHT HEART CATHETERIZATION WITH CORONARY ANGIOGRAM N/A 05/13/2014   Procedure: LEFT AND RIGHT HEART CATHETERIZATION WITH CORONARY ANGIOGRAM;  Surgeon: ChriBurnell Blanks;  [facility];  Service: Cardiovascular;  Laterality: N/A;  . SPINE SURGERY     L5 S1 fusion  . TUBAL LIGATION    . WRIST SURGERY Right    Had surgery twice to shave bone for blood circulation improvement.    SOCIAL HISTORY: Social History   Socioeconomic History  . Marital status: Married    Spouse name: Not on file  . Number of children: 4  . Years of education: GED  . Highest education level: GED or equivalent  Occupational History  . Occupation: Unemployed  Social Needs  . Financial resource strain: Not hard at all  . Food insecurity:    Worry: Never true    Inability: Never true  . Transportation needs:    Medical: No    Non-medical: No  Tobacco Use  .  Smoking status: Former Smoker    Packs/day: 2.00    Years: 50.00    Pack years: 100.00    Types: Cigarettes    Start date: 08/22/1977    Last attempt to quit: 04/21/2012    Years since quitting: 5.9  . Smokeless tobacco: Never Used  Substance and Sexual Activity  . Alcohol use: No  . Drug use: No  . Sexual activity: Yes  Lifestyle  . Physical activity:    Days per week: 0 days    Minutes per session: 0 min  . Stress: To some extent  Relationships  . Social connections:    Talks on phone: More than three times a week    Gets together: More than three times a week    Attends religious service: More than 4 times per year    Active member of club or organization: Yes    Attends meetings of clubs or organizations: More than 4 times per year    Relationship status: Married  . Intimate partner violence:    Fear of  current or ex partner: No    Emotionally abused: No    Physically abused: No    Forced sexual activity: No  Other Topics Concern  . Not on file  Social History Narrative  . Not on file    FAMILY HISTORY: Family History  Problem Relation Age of Onset  . Cirrhosis Mother   . Hyperlipidemia Father   . Heart failure Father   . Bipolar disorder Father   . Obesity Daughter   . Hypertension Brother   . Heart attack Neg Hx   . Miscarriages / Stillbirths Neg Hx     ALLERGIES:  is allergic to aciphex [rabeprazole sodium]; fluconazole; lansoprazole; sucralfate; vancomycin; claritin [loratadine]; crestor [rosuvastatin]; doxycycline; ibuprofen; lipitor [atorvastatin]; macrobid [nitrofurantoin macrocrystal]; metoprolol; metronidazole; nsaids; omeprazole; rabeprazole; and septra [sulfamethoxazole-trimethoprim].  MEDICATIONS:  Current Outpatient Medications  Medication Sig Dispense Refill  . acetaminophen (TYLENOL) 500 MG tablet Take 1,000 mg by mouth every 6 (six) hours as needed (for pain/headaches.).    Marland Kitchen albuterol (PROVENTIL HFA;VENTOLIN HFA) 108 (90 BASE) MCG/ACT inhaler Inhale into the lungs every 6 (six) hours as needed for wheezing or shortness of breath.    . fluticasone (FLONASE) 50 MCG/ACT nasal spray USE TWO SPRAY(S) IN EACH NOSTRIL ONCE DAILY 16 g 5  . Potassium Chloride ER 20 MEQ TBCR Take 20 mEq by mouth daily. (Patient not taking: Reported on 03/22/2018) 30 tablet 3  . predniSONE (DELTASONE) 10 MG tablet   5  . ranitidine (ZANTAC) 300 MG tablet Take 1 tablet (300 mg total) by mouth 2 (two) times daily. 60 tablet 1  . Vitamin D, Ergocalciferol, (DRISDOL) 50000 units CAPS capsule Take 1 capsule (50,000 Units total) by mouth every 7 (seven) days. 12 capsule 3   No current facility-administered medications for this visit.     REVIEW OF SYSTEMS:   A 10+ POINT REVIEW OF SYSTEMS WAS OBTAINED including neurology, dermatology, psychiatry, cardiac, respiratory, lymph, extremities, GI,  GU, Musculoskeletal, constitutional, breasts, reproductive, HEENT.  All pertinent positives are noted in the HPI.  All others are negative.   PHYSICAL EXAMINATION:  Vitals:   03/22/18 1111  BP: (!) 121/49  Pulse: 88  Resp: 18  Temp: 98.2 F (36.8 C)  SpO2: 92%   Filed Weights   03/22/18 1111  Weight: 220 lb 12.8 oz (100.2 kg)    GENERAL:alert, in no acute distress and comfortable SKIN: multiple chronic crusted lesions on extremities and trunk  EYES: conjunctiva are pink and non-injected, sclera anicteric OROPHARYNX: MMM, no exudates, no oropharyngeal erythema or ulceration NECK: supple, no JVD LYMPH:  no palpable lymphadenopathy in the cervical, axillary or inguinal regions LUNGS: clear to auscultation b/l with normal respiratory effort HEART: regular rate & rhythm ABDOMEN:  normoactive bowel sounds , non tender, not distended. No palpable hepatosplenomegaly.  Extremity: no pedal edema PSYCH: alert & oriented x 3 with fluent speech NEURO: no focal motor/sensory deficits   LABORATORY DATA:  I have reviewed the data as listed Recent Results (from the past 2160 hour(s))  Lipid panel     Status: None   Collection Time: 12/31/17 12:55 PM  Result Value Ref Range   Cholesterol, Total 152 100 - 199 mg/dL   Triglycerides 112 0 - 149 mg/dL   HDL 53 >39 mg/dL   VLDL Cholesterol Cal 22 5 - 40 mg/dL   LDL Calculated 77 0 - 99 mg/dL   Chol/HDL Ratio 2.9 0.0 - 4.4 ratio    Comment:                                   T. Chol/HDL Ratio                                             Men  Women                               1/2 Avg.Risk  3.4    3.3                                   Avg.Risk  5.0    4.4                                2X Avg.Risk  9.6    7.1                                3X Avg.Risk 23.4   11.0   CMP14+EGFR     Status: Abnormal   Collection Time: 12/31/17 12:55 PM  Result Value Ref Range   Glucose 178 (H) 65 - 99 mg/dL   BUN 7 6 - 24 mg/dL   Creatinine, Ser 0.72 0.57 -  1.00 mg/dL   GFR calc non Af Amer 93 >59 mL/min/1.73   GFR calc Af Amer 108 >59 mL/min/1.73   BUN/Creatinine Ratio 10 9 - 23   Sodium 142 134 - 144 mmol/L   Potassium 3.3 (L) 3.5 - 5.2 mmol/L   Chloride 103 96 - 106 mmol/L   CO2 23 20 - 29 mmol/L   Calcium 8.8 8.7 - 10.2 mg/dL   Total Protein 5.9 (L) 6.0 - 8.5 g/dL   Albumin 3.8 3.5 - 5.5 g/dL   Globulin, Total 2.1 1.5 - 4.5 g/dL   Albumin/Globulin Ratio 1.8 1.2 - 2.2   Bilirubin Total 1.5 (H) 0.0 - 1.2 mg/dL   Alkaline Phosphatase 145 (H) 39 - 117 IU/L   AST 67 (H) 0 - 40 IU/L   ALT 64 (H) 0 -  32 IU/L  VITAMIN D 25 Hydroxy (Vit-D Deficiency, Fractures)     Status: Abnormal   Collection Time: 12/31/17 12:55 PM  Result Value Ref Range   Vit D, 25-Hydroxy 18.4 (L) 30.0 - 100.0 ng/mL    Comment: Vitamin D deficiency has been defined by the [place] practice guideline as a level of serum 25-OH vitamin D less than 20 ng/mL (1,2). The Endocrine Society went on to further define vitamin D insufficiency as a level between 21 and 29 ng/mL (2). 1. IOM (Institute of Medicine). 2010. Dietary reference    intakes for calcium and D. [place]: The    [employer]. 2. Holick MF, Binkley Tigard, Bischoff-Ferrari HA, et al.    Evaluation, treatment, and prevention of vitamin D    deficiency: an Endocrine Society clinical practice    guideline. JCEM. 2011 Jul; 96(7):1911-30.   TSH     Status: None   Collection Time: 12/31/17 12:55 PM  Result Value Ref Range   TSH 1.280 0.450 - 4.500 uIU/mL  Bayer DCA Hb A1c Waived     Status: None   Collection Time: 12/31/17 12:55 PM  Result Value Ref Range   HB A1C (BAYER DCA - WAIVED) 5.5 <7.0 %    Comment:                                       Diabetic Adult            <7.0                                       Healthy Adult        4.3 - 5.7                                                           (DCCT/NGSP) American Diabetes Association's Summary of  Glycemic Recommendations for Adults with Diabetes: Hemoglobin A1c <7.0%. More stringent glycemic goals (A1c <6.0%) may further reduce complications at the cost of increased risk of hypoglycemia.   IGP, CtNg, rfx Aptima HPV ASCU     Status: None   Collection Time: 12/31/17 12:55 PM  Result Value Ref Range   DIAGNOSIS: Comment     Comment: NEGATIVE FOR INTRAEPITHELIAL LESION OR MALIGNANCY.   Specimen adequacy: Comment     Comment: Satisfactory for evaluation. Endocervical and/or squamous metaplastic cells (endocervical component) are present.    Clinician Provided ICD10 Comment     Comment: Z00.00 Z01.419    Performed by: Comment     Comment: Chapman Moss, Cytotechnologist (ASCP)   PAP Smear Comment .    Note: Comment     Comment: The Pap smear is a screening test designed to aid in the detection of premalignant and malignant conditions of the uterine cervix.  It is not a diagnostic procedure and should not be used as the sole means of detecting cervical cancer.  Both false-positive and false-negative reports do occur.    Test Methodology Comment     Comment: This liquid based ThinPrep(R) pap test was screened with the use of an image guided  system.    PAP Reflex Comment     Comment: The HPV DNA reflex criteria were not met with this specimen result therefore, no HPV testing was performed.    Chlamydia, Nuc. Acid Amp Negative Negative   Gonococcus by Nucleic Acid Amp Negative Negative  Basic metabolic panel     Status: Abnormal   Collection Time: 03/15/18 12:35 PM  Result Value Ref Range   Sodium 144 135 - 145 mmol/L   Potassium 3.3 (L) 3.5 - 5.1 mmol/L   Chloride 109 98 - 111 mmol/L   CO2 26 22 - 32 mmol/L   Glucose, Bld 124 (H) 70 - 99 mg/dL   BUN 7 6 - 20 mg/dL   Creatinine, Ser 0.54 0.44 - 1.00 mg/dL   Calcium 8.9 8.9 - 10.3 mg/dL   GFR calc non Af Amer >60 >60 mL/min   GFR calc Af Amer >60 >60 mL/min    Comment: (NOTE) The eGFR has been calculated using the CKD  EPI equation. This calculation has not been validated in all clinical situations. eGFR's persistently <60 mL/min signify possible Chronic Kidney Disease.    Anion gap 9 5 - 15    Comment: Performed at [facility], [place] [address] [place], [place] [address]  CBC     Status: Abnormal   Collection Time: 03/15/18 12:35 PM  Result Value Ref Range   WBC 6.3 4.0 - 10.5 K/uL   RBC 4.78 3.87 - 5.11 MIL/uL   Hemoglobin 14.1 12.0 - 15.0 g/dL   HCT 41.6 36.0 - 46.0 %   MCV 87.0 78.0 - 100.0 fL   MCH 29.5 26.0 - 34.0 pg   MCHC 33.9 30.0 - 36.0 g/dL   RDW 14.2 11.5 - 15.5 %   Platelets 114 (L) 150 - 400 K/uL    Comment: REPEATED TO VERIFY SPECIMEN CHECKED FOR CLOTS PLATELET COUNT CONFIRMED BY SMEAR Performed at [place] [address], [place], [place] [address]   Protime-INR     Status: None   Collection Time: 03/15/18 12:35 PM  Result Value Ref Range   Prothrombin Time 14.9 11.4 - 15.2 seconds   INR 1.18     Comment: Performed at [facility], [place] [address] [place], [place] [address]  Ferritin     Status: None   Collection Time: 03/22/18  9:27 AM  Result Value Ref Range   Ferritin 119 11 - 307 ng/mL    Comment: Performed at [facility], [place] [address], [place], [place] [address]  Iron and TIBC     Status: None   Collection Time: 03/22/18  9:27 AM  Result Value Ref Range   Iron 90 41 - 142 ug/dL   TIBC 360 236 - 444 ug/dL   Saturation Ratios 25 21 - 57 %   UIBC 270 ug/dL    Comment: Performed at [facility], [place] [address] [place], [place] [address]  CBC with Differential ([place] Only)     Status: Abnormal   Collection Time: 03/22/18  9:27 AM  Result Value Ref Range   WBC Count 5.7 3.9 - 10.3 K/uL   RBC 4.71 3.70 - 5.45 MIL/uL   Hemoglobin 13.8 11.6 - 15.9 g/dL   HCT 41.2 34.8 - 46.6 %   MCV 87.5 79.5 - 101.0 fL   MCH 29.3 25.1 - 34.0 pg   MCHC 33.5 31.5  - 36.0 g/dL   RDW 14.4 11.2 -  14.5 %   Platelet Count 87 (L) 145 - 400 K/uL   Neutrophils Relative % 69 %   Neutro Abs 3.9 1.5 - 6.5 K/uL   Lymphocytes Relative 23 %   Lymphs Abs 1.3 0.9 - 3.3 K/uL   Monocytes Relative 6 %   Monocytes Absolute 0.3 0.1 - 0.9 K/uL   Eosinophils Relative 2 %   Eosinophils Absolute 0.1 0.0 - 0.5 K/uL   Basophils Relative 0 %   Basophils Absolute 0.0 0.0 - 0.1 K/uL    Comment: Performed at [facility], [place] [address], [place], [place] [address]  Reticulocytes     Status: Abnormal   Collection Time: 03/22/18  9:27 AM  Result Value Ref Range   Retic Ct Pct 3.5 (H) 0.7 - 2.1 %   RBC. 4.71 3.70 - 5.45 MIL/uL   Retic Count, Absolute 164.9 (H) 33.7 - 90.7 K/uL    Comment: Performed at [facility], [place] [address], [place], [place] [address]   . Lab Results  Component Value Date   IRON 90 03/22/2018   TIBC 360 03/22/2018   IRONPCTSAT 25 03/22/2018   (Iron and TIBC)  Lab Results  Component Value Date   FERRITIN 119 03/22/2018   . CBC Latest Ref Rng & Units 03/22/2018 03/15/2018 11/27/2017  WBC 3.9 - 10.3 K/uL 5.7 6.3 8.8  Hemoglobin 11.6 - 15.9 g/dL 13.8 14.1 14.6  Hematocrit 34.8 - 46.6 % 41.2 41.6 45.3  Platelets 145 - 400 K/uL 87(L) 114(L) 97(L)     Diagnosis 05/07/17 1. Stomach, biopsy, antrum - REACTIVE GASTROPATHY. - THERE IS NO EVIDENCE OF HELICOBACTER PYLORI, DYSPLASIA OR MALIGNANCY. - SEE COMMENT. 2. Colon, polyp(s), cecal x 2 - TUBULAR ADENOMA(S). - HIGH GRADE DYSPLASIA IS NOT IDENTIFIED. 3. Colon, polyp(s), descending x 2 - TUBULAR ADENOMA(S). - HIGH GRADE DYSPLASIA IS NOT IDENTIFIED. Microscopic Comment 1. This pattern can be associated with non-steroidal anti-inflammatory drugs (NSAIDS), alcohol or with other causes of chemical gastropathy. Helicobacter pylori are not identified with Warthin-Starry stain.   ASSESSMENT & PLAN:   57 y.o. female with   1. History of recurrent  Pulmonary Embolisms Her previous events appears to have been triggered by taking estrogen containing hormones and smoking. No overt Fhx of thrombophilias and no previous VTE with several pregnancies and surgeries - suggest against significant hereditary thrombophilia. Factor V Leiden was negative, Prothrombin gene mutation neg, APLA Ab panel - unrevealing.  PLAN:  -patient continues to have issues with GI bleeding and is off blood thinners as a result -no clear justification for restarting anticoagulation till her GI bleeding is completely control and then she could be considered for prophylactic ASA.  2. GI bleeding -recent w/u done -05/07/17 Upper Endoscopy and Colonoscopy show gastritis with 4 large colon polyps which were removed which were found to be tubular adenomas.    3. Autoimmune hepatitis -continue f/u with Dr Oletta Lamas for further evaluation and mx of GI bleeding.  4. Iron deficiency Anemia related to GI bleeding. Has needed previous IV Iron and PRBC transfusions. . Lab Results  Component Value Date   IRON 90 03/22/2018   TIBC 360 03/22/2018   IRONPCTSAT 25 03/22/2018   (Iron and TIBC)  Lab Results  Component Value Date   FERRITIN 119 03/22/2018   Plan  -continue f/u with PCP and GI regarding GI bleeding isseus -We discussed IV Iron as needed with close monitoring of her blood levels as she continue to have blood loss.  -  ferritin today adequate with no indication for additional IV iron at this time. 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 -Discussed that we will conduct further work up if her PLT drop beneath 50k -PLT have decreased, skin rashes have increased, joint pain and swelling have increased, all alongside her prednisone taper -Recommended that the pt's PCP Dr. Claretta Fraise consider referral to Rheumatologist for  concerns of a broader autoimmune process -No indication for IV Iron at this time -Will see pt back in 4 months   RTC with Dr Irene Limbo in 4 months with labs   All questions were answered. The patient knows to call the clinic with any problems, questions or concerns.  The total time spent in the appt was 25 minutes and more than 50% was on counseling and direct patient cares.   Sullivan Lone MD MS AAHIVMS [facility] Hematology/Oncology Physician [facility]  (Office):       [phone] (Work cell):  [phone] (Fax):           [phone]  I, Baldwin [place], am acting as a scribe for Dr. Irene Limbo  .I have reviewed the above documentation for accuracy and completeness, and I agree with the above. Brunetta Genera MD

## 2018-03-22 ENCOUNTER — Inpatient Hospital Stay: Payer: Medicare HMO | Attending: Hematology

## 2018-03-22 ENCOUNTER — Encounter: Payer: Self-pay | Admitting: Hematology

## 2018-03-22 ENCOUNTER — Inpatient Hospital Stay (HOSPITAL_BASED_OUTPATIENT_CLINIC_OR_DEPARTMENT_OTHER): Payer: Medicare HMO | Admitting: Hematology

## 2018-03-22 ENCOUNTER — Telehealth: Payer: Self-pay | Admitting: Hematology

## 2018-03-22 VITALS — BP 121/49 | HR 88 | Temp 98.2°F | Resp 18 | Ht 64.0 in | Wt 220.8 lb

## 2018-03-22 DIAGNOSIS — D500 Iron deficiency anemia secondary to blood loss (chronic): Secondary | ICD-10-CM

## 2018-03-22 DIAGNOSIS — D696 Thrombocytopenia, unspecified: Secondary | ICD-10-CM | POA: Diagnosis not present

## 2018-03-22 DIAGNOSIS — K754 Autoimmune hepatitis: Secondary | ICD-10-CM

## 2018-03-22 DIAGNOSIS — K922 Gastrointestinal hemorrhage, unspecified: Secondary | ICD-10-CM

## 2018-03-22 DIAGNOSIS — Z86711 Personal history of pulmonary embolism: Secondary | ICD-10-CM | POA: Insufficient documentation

## 2018-03-22 DIAGNOSIS — D6859 Other primary thrombophilia: Secondary | ICD-10-CM

## 2018-03-22 LAB — IRON AND TIBC
Iron: 90 ug/dL (ref 41–142)
Saturation Ratios: 25 % (ref 21–57)
TIBC: 360 ug/dL (ref 236–444)
UIBC: 270 ug/dL

## 2018-03-22 LAB — CBC WITH DIFFERENTIAL (CANCER CENTER ONLY)
Basophils Absolute: 0 10*3/uL (ref 0.0–0.1)
Basophils Relative: 0 %
Eosinophils Absolute: 0.1 10*3/uL (ref 0.0–0.5)
Eosinophils Relative: 2 %
HCT: 41.2 % (ref 34.8–46.6)
Hemoglobin: 13.8 g/dL (ref 11.6–15.9)
Lymphocytes Relative: 23 %
Lymphs Abs: 1.3 10*3/uL (ref 0.9–3.3)
MCH: 29.3 pg (ref 25.1–34.0)
MCHC: 33.5 g/dL (ref 31.5–36.0)
MCV: 87.5 fL (ref 79.5–101.0)
Monocytes Absolute: 0.3 10*3/uL (ref 0.1–0.9)
Monocytes Relative: 6 %
Neutro Abs: 3.9 10*3/uL (ref 1.5–6.5)
Neutrophils Relative %: 69 %
Platelet Count: 87 10*3/uL — ABNORMAL LOW (ref 145–400)
RBC: 4.71 MIL/uL (ref 3.70–5.45)
RDW: 14.4 % (ref 11.2–14.5)
WBC Count: 5.7 10*3/uL (ref 3.9–10.3)

## 2018-03-22 LAB — FERRITIN: Ferritin: 119 ng/mL (ref 11–307)

## 2018-03-22 LAB — RETICULOCYTES
RBC.: 4.71 MIL/uL (ref 3.70–5.45)
Retic Count, Absolute: 164.9 10*3/uL — ABNORMAL HIGH (ref 33.7–90.7)
Retic Ct Pct: 3.5 % — ABNORMAL HIGH (ref 0.7–2.1)

## 2018-03-22 NOTE — Telephone Encounter (Signed)
Appt scheduled AVS/Calendar printed per 8/2 los

## 2018-03-28 ENCOUNTER — Other Ambulatory Visit: Payer: Self-pay | Admitting: Nurse Practitioner

## 2018-03-28 DIAGNOSIS — K7581 Nonalcoholic steatohepatitis (NASH): Secondary | ICD-10-CM

## 2018-04-05 ENCOUNTER — Encounter: Payer: Self-pay | Admitting: Family Medicine

## 2018-04-05 ENCOUNTER — Ambulatory Visit (INDEPENDENT_AMBULATORY_CARE_PROVIDER_SITE_OTHER): Payer: Medicare HMO | Admitting: Family Medicine

## 2018-04-05 VITALS — BP 121/70 | HR 104 | Temp 97.7°F | Ht 64.0 in | Wt 221.5 lb

## 2018-04-05 DIAGNOSIS — M2550 Pain in unspecified joint: Secondary | ICD-10-CM | POA: Diagnosis not present

## 2018-04-05 DIAGNOSIS — J449 Chronic obstructive pulmonary disease, unspecified: Secondary | ICD-10-CM | POA: Diagnosis not present

## 2018-04-05 DIAGNOSIS — K7460 Unspecified cirrhosis of liver: Secondary | ICD-10-CM

## 2018-04-05 DIAGNOSIS — D500 Iron deficiency anemia secondary to blood loss (chronic): Secondary | ICD-10-CM

## 2018-04-05 MED ORDER — TRIAMCINOLONE ACETONIDE 0.1 % EX CREA: TOPICAL_CREAM | CUTANEOUS | 2 refills | Status: DC

## 2018-04-05 NOTE — Patient Instructions (Signed)

## 2018-04-05 NOTE — Progress Notes (Signed)
Subjective:  Patient ID: Kimberly Obrien, female    DOB: 02/11/1961  Age: 57 y.o. MRN: [id number]  CC: Medical Management of Chronic Issues   HPI ZOLA RUNION presents for continued chronic joint pain and abdominal pain. She swells a lot as well. She was recently told that her hepatitis is not autoimmune, but that she has 20% involvement of liver. She continues with chronic aches that are moderately severe in the back, hands, hips and knees. It interferes with her ability to perform IADLs. Also has periodic iron infusions for iron deficiency anemia. Unable to tolerate oral form.  Depression screen [facility] 2/9 04/05/2018 12/31/2017 12/20/2017  Decreased Interest 1 1 0  Down, Depressed, Hopeless 0 0 0  PHQ - 2 Score 1 1 0  Altered sleeping - - -  Tired, decreased energy - - -  Change in appetite - - -  Feeling bad or failure about yourself  - - -  Trouble concentrating - - -  Moving slowly or fidgety/restless - - -  Suicidal thoughts - - -  PHQ-9 Score - - -  Some recent data might be hidden    History [place] has a past medical history of Anticoagulation monitoring by pharmacist, Arthritis, Asthma, Autoimmune hepatitis ([place]), Cataract, COPD (chronic obstructive pulmonary disease) ([place]), Dyspnea, Hyperlipidemia, PE (pulmonary embolism), and Sleep apnea.   She has a past surgical history that includes Tubal ligation; Wrist surgery (Right); Cholecystectomy; Spine surgery; Abdominal hysterectomy; Eye surgery; left and right heart catheterization with coronary angiogram (N/A, 05/13/2014); Incontinence surgery (2009); Cataract extraction, bilateral (Bilateral); Esophagogastroduodenoscopy (egd) with propofol (N/A, 05/07/2017); Colonoscopy with propofol (N/A, 05/07/2017); IR Venogram Hepatic W Hemodynamic Evaluation (03/15/2018); and IR Transcatheter BX (03/15/2018).   Her family history includes Bipolar disorder in her father; Cirrhosis in her mother; Heart failure in her father; Hyperlipidemia  in her father; Hypertension in her brother; Obesity in her daughter.She reports that she quit smoking about 5 years ago. Her smoking use included cigarettes. She started smoking about 40 years ago. She has a 100.00 pack-year smoking history. She has never used smokeless tobacco. She reports that she does not drink alcohol or use drugs.    ROS Review of Systems  Constitutional: Negative.   HENT: Negative for congestion.   Eyes: Negative for visual disturbance.  Respiratory: Negative for shortness of breath.   Cardiovascular: Negative for chest pain.  Gastrointestinal: Positive for abdominal pain. Negative for constipation, diarrhea, nausea and vomiting.  Genitourinary: Negative for difficulty urinating.  Musculoskeletal: Positive for arthralgias. Negative for myalgias.  Neurological: Negative for headaches.  Psychiatric/Behavioral: Negative for sleep disturbance.    Objective:  BP 121/70   Pulse (!) 104   Temp 97.7 F (36.5 C) (Oral)   Ht 5' 4" (1.626 m)   Wt 221 lb 8 oz (100.5 kg)   SpO2 94% Comment: Room Air  BMI 38.02 kg/m   BP Readings from Last 3 Encounters:  04/05/18 121/70  03/22/18 (!) 121/49  03/15/18 117/69    Wt Readings from Last 3 Encounters:  04/05/18 221 lb 8 oz (100.5 kg)  03/22/18 220 lb 12.8 oz (100.2 kg)  12/31/17 221 lb 9.6 oz (100.5 kg)     Physical Exam  Constitutional: She is oriented to person, place, and time. She appears well-developed and well-nourished. No distress.  HENT:  Head: Normocephalic and atraumatic.  Eyes: Pupils are equal, round, and reactive to light. Conjunctivae are normal.  Neck: Normal range of motion. Neck supple. No thyromegaly present.  Cardiovascular: Normal rate, regular rhythm and normal heart sounds.  No murmur heard. Pulmonary/Chest: Effort normal and breath sounds normal. No respiratory distress. She has no wheezes. She has no rales.  Abdominal: Soft. Bowel sounds are normal. She exhibits no distension. There is no  tenderness.  Musculoskeletal: Normal range of motion.  Lymphadenopathy:    She has no cervical adenopathy.  Neurological: She is alert and oriented to person, place, and time.  Skin: Skin is warm and dry.  Psychiatric: She has a normal mood and affect. Her behavior is normal. Judgment and thought content normal.      Assessment & Plan:   Derriona was seen today for medical management of chronic issues.  Diagnoses and all orders for this visit:  Arthralgia, unspecified joint -     Ambulatory referral to Rheumatology  Chronic obstructive pulmonary disease, unspecified COPD type ([place]) -     CBC with Differential/Platelet -     CMP14+EGFR  Hepatic cirrhosis, unspecified hepatic cirrhosis type, unspecified whether ascites present ([place])  Iron deficiency anemia due to chronic blood loss  Other orders -     triamcinolone cream (KENALOG) 0.1 %; Apply to skin lesions three times daily       I have discontinued Mardene Celeste B. Elkhatib's ranitidine and Potassium Chloride ER. I am also having her start on triamcinolone cream. Additionally, I am having her maintain her albuterol, acetaminophen, fluticasone, Vitamin D (Ergocalciferol), and predniSONE.  Allergies as of 04/05/2018      Reactions   Aciphex [rabeprazole Sodium] Rash   Fluconazole Rash, Hives   Lansoprazole Hives   Sucralfate Nausea Only   Vancomycin Diarrhea   Claritin [loratadine] Other (See Comments)   Tired   Crestor [rosuvastatin] Other (See Comments)   Elevated blood glucose and abdominal pain   Doxycycline Rash   Ibuprofen Other (See Comments)   Upset stomach   Lipitor [atorvastatin] Other (See Comments)   Myalgias, leg pain.  Problems with pancreas, sugars went up also.   Macrobid [nitrofurantoin Macrocrystal] Nausea And Vomiting   Metoprolol Nausea And Vomiting   Metronidazole Other (See Comments), Nausea Only   Dizziness, nausea, dry mouth and some shortness of breath   Nsaids Other (See Comments)   Upset  stomach   Omeprazole    bloating   Rabeprazole Rash   Septra [sulfamethoxazole-trimethoprim] Nausea And Vomiting      Medication List        Accurate as of 04/05/18 11:51 AM. Always use your most recent med list.          acetaminophen 500 MG tablet Commonly known as:  TYLENOL Take 1,000 mg by mouth every 6 (six) hours as needed (for pain/headaches.).   albuterol 108 (90 Base) MCG/ACT inhaler Commonly known as:  PROVENTIL HFA;VENTOLIN HFA Inhale into the lungs every 6 (six) hours as needed for wheezing or shortness of breath.   fluticasone 50 MCG/ACT nasal spray Commonly known as:  FLONASE USE TWO SPRAY(S) IN EACH NOSTRIL ONCE DAILY   predniSONE 5 MG tablet Commonly known as:  DELTASONE Take 2.5 mg by mouth daily.   triamcinolone cream 0.1 % Commonly known as:  KENALOG Apply to skin lesions three times daily   Vitamin D (Ergocalciferol) 50000 units Caps capsule Commonly known as:  DRISDOL Take 1 capsule (50,000 Units total) by mouth every 7 (seven) days.        Follow-up: Return in about 6 months (around 10/06/2018).  Claretta Fraise, M.D.

## 2018-04-06 LAB — CMP14+EGFR
ALT: 55 IU/L — ABNORMAL HIGH (ref 0–32)
AST: 53 IU/L — ABNORMAL HIGH (ref 0–40)
Albumin/Globulin Ratio: 1.8 (ref 1.2–2.2)
Albumin: 3.7 g/dL (ref 3.5–5.5)
Alkaline Phosphatase: 115 IU/L (ref 39–117)
BUN/Creatinine Ratio: 10 (ref 9–23)
BUN: 6 mg/dL (ref 6–24)
Bilirubin Total: 1.6 mg/dL — ABNORMAL HIGH (ref 0.0–1.2)
CO2: 24 mmol/L (ref 20–29)
Calcium: 8.9 mg/dL (ref 8.7–10.2)
Chloride: 104 mmol/L (ref 96–106)
Creatinine, Ser: 0.62 mg/dL (ref 0.57–1.00)
GFR calc Af Amer: 116 mL/min/{1.73_m2} (ref >59)
GFR calc non Af Amer: 100 mL/min/{1.73_m2} (ref >59)
Globulin, Total: 2.1 g/dL (ref 1.5–4.5)
Glucose: 150 mg/dL — ABNORMAL HIGH (ref 65–99)
Potassium: 3.1 mmol/L — ABNORMAL LOW (ref 3.5–5.2)
Sodium: 144 mmol/L (ref 134–144)
Total Protein: 5.8 g/dL — ABNORMAL LOW (ref 6.0–8.5)

## 2018-04-06 LAB — CBC WITH DIFFERENTIAL/PLATELET
Basophils Absolute: 0 10*3/uL (ref 0.0–0.2)
Basos: 1 %
EOS (ABSOLUTE): 0.1 10*3/uL (ref 0.0–0.4)
Eos: 1 %
Hematocrit: 41.7 % (ref 34.0–46.6)
Hemoglobin: 13.9 g/dL (ref 11.1–15.9)
Immature Grans (Abs): 0 10*3/uL (ref 0.0–0.1)
Immature Granulocytes: 0 %
Lymphocytes Absolute: 1.3 10*3/uL (ref 0.7–3.1)
Lymphs: 21 %
MCH: 28.5 pg (ref 26.6–33.0)
MCHC: 33.3 g/dL (ref 31.5–35.7)
MCV: 86 fL (ref 79–97)
Monocytes Absolute: 0.4 10*3/uL (ref 0.1–0.9)
Monocytes: 6 %
Neutrophils Absolute: 4.4 10*3/uL (ref 1.4–7.0)
Neutrophils: 71 %
Platelets: 109 10*3/uL — ABNORMAL LOW (ref 150–450)
RBC: 4.87 x10E6/uL (ref 3.77–5.28)
RDW: 13.9 % (ref 12.3–15.4)
WBC: 6.2 10*3/uL (ref 3.4–10.8)

## 2018-04-08 ENCOUNTER — Ambulatory Visit
Admission: RE | Admit: 2018-04-08 | Discharge: 2018-04-08 | Disposition: A | Discharge status: Home / Self Care | Payer: Medicare HMO | Source: Ambulatory Visit | Attending: Nurse Practitioner | Admitting: Nurse Practitioner

## 2018-04-08 DIAGNOSIS — K7581 Nonalcoholic steatohepatitis (NASH): Secondary | ICD-10-CM

## 2018-04-08 DIAGNOSIS — K760 Fatty (change of) liver, not elsewhere classified: Secondary | ICD-10-CM | POA: Diagnosis not present

## 2018-04-16 NOTE — Addendum Note (Signed)
Encounter addended by: Serena Croissant on: 04/16/2018 10:40 AM  Actions taken: Imaging Exam ended, Narrator Event Log accessed

## 2018-04-24 DIAGNOSIS — Z0100 Encounter for examination of eyes and vision without abnormal findings: Secondary | ICD-10-CM | POA: Diagnosis not present

## 2018-05-20 DIAGNOSIS — Z1231 Encounter for screening mammogram for malignant neoplasm of breast: Secondary | ICD-10-CM | POA: Diagnosis not present

## 2018-05-20 LAB — HM MAMMOGRAPHY

## 2018-05-31 ENCOUNTER — Encounter: Payer: Self-pay | Admitting: Pediatrics

## 2018-05-31 ENCOUNTER — Ambulatory Visit (INDEPENDENT_AMBULATORY_CARE_PROVIDER_SITE_OTHER): Payer: Medicare HMO | Admitting: Pediatrics

## 2018-05-31 VITALS — BP 133/72 | HR 93 | Temp 97.0°F | Resp 20 | Ht 64.0 in | Wt 218.0 lb

## 2018-05-31 DIAGNOSIS — J441 Chronic obstructive pulmonary disease with (acute) exacerbation: Secondary | ICD-10-CM | POA: Diagnosis not present

## 2018-05-31 MED ORDER — AZITHROMYCIN 250 MG PO TABS: ORAL_TABLET | ORAL | 0 refills | Status: DC

## 2018-05-31 MED ORDER — PREDNISONE 20 MG PO TABS: ORAL_TABLET | ORAL | 0 refills | Status: DC

## 2018-05-31 NOTE — Patient Instructions (Addendum)
Albuterol at least three times a day while on medicines, will help with cough Symbicort twice a day  Fever reducer and headache: tylenol   Sinus pressure:  Nasal steroid such as flonase/fluticaone or nasocort daily Can also take daily antihistamine such as loratadine/claritin or cetirizine/zyrtec  Sinus rinses/irritation: Netipot or similar with distilled water 2-3 times a day to clear out sinuses or Normal saline nasal spray  Sore throat:  Throat lozenges chloroseptic spray  Stick with bland foods Drink lots of fluids

## 2018-05-31 NOTE — Progress Notes (Signed)
  Subjective:   Patient ID: Kimberly Obrien, female    DOB: Aug 06, 1961, 57 y.o.   MRN: [id number] CC: Ear Pain (Left ); Facial Pain; Cough; and Sore Throat  HPI: BROOKS STOTZ is a 57 y.o. female   Started having symptoms a couple weeks ago, has been getting worse the last 4 days.  Cough is been bothering her, has been wheezing some.  Lots of sinus drainage at night.  Low-grade temperatures.  Appetite is been down.  Is taking albuterol a couple days ago, taking symbicort for the last couple days.  Has been coughing up yellow sputum.  Feeling some shortness of breath.  Relevant past medical, surgical, family and social history reviewed. Allergies and medications reviewed and updated. Social History   Tobacco Use  Smoking Status Former Smoker  . Packs/day: 2.00  . Years: 50.00  . Pack years: 100.00  . Types: Cigarettes  . Start date: 08/22/1977  . Last attempt to quit: 04/21/2012  . Years since quitting: 6.1  Smokeless Tobacco Never Used   ROS: Per HPI   Objective:    BP 133/72   Pulse 93   Temp (!) 97 F (36.1 C) (Oral)   Resp 20   Ht 5\' 4"  (1.626 m)   Wt 218 lb (98.9 kg)   SpO2 96%   BMI 37.42 kg/m   Wt Readings from Last 3 Encounters:  05/31/18 218 lb (98.9 kg)  04/05/18 221 lb 8 oz (100.5 kg)  03/22/18 220 lb 12.8 oz (100.2 kg)    Gen: NAD, alert, cooperative with exam, NCAT EYES: EOMI, no conjunctival injection, or no icterus ENT:  TMs dull gray b/l, OP without erythema LYMPH: no cervical LAD CV: NRRR, normal S1/S2, no murmur, distal pulses 2+ b/l Resp: Moving air wheezing present bilaterally with forced exhalation, no crackles, comfortable WOB, dry cough Ext: No edema, warm Neuro: Alert and oriented MSK: normal muscle bulk  Assessment & Plan:  Valmai was seen today for ear pain, facial pain, cough and sore throat.  Diagnoses and all orders for this visit:  COPD exacerbation ([place]) Symptomatic careand return precautions discussed.  Start below. -      predniSONE (DELTASONE) 20 MG tablet; 2 po at same time daily for 3 days -     azithromycin (ZITHROMAX) 250 MG tablet; Take 2 the first day and then one each day after.   Follow up plan: Return if symptoms worsen or fail to improve. Assunta Found, MD [place]

## 2018-06-05 ENCOUNTER — Ambulatory Visit: Payer: Medicare HMO | Admitting: Physician Assistant

## 2018-06-21 DIAGNOSIS — Z6834 Body mass index (BMI) 34.0-34.9, adult: Secondary | ICD-10-CM | POA: Diagnosis not present

## 2018-06-21 DIAGNOSIS — E669 Obesity, unspecified: Secondary | ICD-10-CM | POA: Diagnosis not present

## 2018-06-21 DIAGNOSIS — G894 Chronic pain syndrome: Secondary | ICD-10-CM | POA: Diagnosis not present

## 2018-06-21 DIAGNOSIS — K760 Fatty (change of) liver, not elsewhere classified: Secondary | ICD-10-CM | POA: Diagnosis not present

## 2018-06-21 DIAGNOSIS — M545 Low back pain: Secondary | ICD-10-CM | POA: Diagnosis not present

## 2018-06-21 DIAGNOSIS — R21 Rash and other nonspecific skin eruption: Secondary | ICD-10-CM | POA: Diagnosis not present

## 2018-06-21 DIAGNOSIS — M150 Primary generalized (osteo)arthritis: Secondary | ICD-10-CM | POA: Diagnosis not present

## 2018-07-02 ENCOUNTER — Encounter: Payer: Self-pay | Admitting: Family Medicine

## 2018-07-02 ENCOUNTER — Ambulatory Visit (INDEPENDENT_AMBULATORY_CARE_PROVIDER_SITE_OTHER): Payer: Medicare HMO | Admitting: Family Medicine

## 2018-07-02 VITALS — BP 111/68 | HR 85 | Temp 97.4°F | Ht 64.0 in | Wt 212.0 lb

## 2018-07-02 DIAGNOSIS — M797 Fibromyalgia: Secondary | ICD-10-CM

## 2018-07-02 DIAGNOSIS — R21 Rash and other nonspecific skin eruption: Secondary | ICD-10-CM

## 2018-07-02 MED ORDER — PREGABALIN 50 MG PO CAPS: ORAL_CAPSULE | ORAL | 0 refills | Status: DC

## 2018-07-02 NOTE — Progress Notes (Signed)
Subjective:  Patient ID: Kimberly Obrien, female    DOB: Mar 11, 1961  Age: 57 y.o. MRN: [id number]  CC: Fibromyalgia   HPI Kimberly Obrien presents for recently seen by rheumatology.  A diagnosis given was fibromyalgia.  Patient was referred back to here for definitive treatment.  Patient would also like to see a dermatologist regarding the ongoing breaking out of lesions on her arms.  With regard to the fibromyalgia it manifests primarily as a constant aching that never goes away she hurts in her shoulders primarily but has a aching in her chest and back as well.  The aching extends down into her legs as well.  There is also shooting pain.  Shooting pains may occur anywhere in her body.  The pain is intense enough that it leads to frequent lassitude and fatigue.  She complains of just having no energy.  Of note is that she has been prescribed gabapentin in the past and could not tolerate it.  This was due apparently to dizziness.  Depression screen [facility] 2/9 07/02/2018 05/31/2018 04/05/2018  Decreased Interest 0 0 1  Down, Depressed, Hopeless 0 0 0  PHQ - 2 Score 0 0 1  Altered sleeping - - -  Tired, decreased energy - - -  Change in appetite - - -  Feeling bad or failure about yourself  - - -  Trouble concentrating - - -  Moving slowly or fidgety/restless - - -  Suicidal thoughts - - -  PHQ-9 Score - - -  Some recent data might be hidden    History Kimberly Obrien has a past medical history of Anticoagulation monitoring by pharmacist, Arthritis, Asthma, Autoimmune hepatitis ([place]), Cataract, COPD (chronic obstructive pulmonary disease) ([place]), Dyspnea, Hyperlipidemia, PE (pulmonary embolism), and Sleep apnea.   She has a past surgical history that includes Tubal ligation; Wrist surgery (Right); Cholecystectomy; Spine surgery; Abdominal hysterectomy; Eye surgery; left and right heart catheterization with coronary angiogram (N/A, 05/13/2014); Incontinence surgery (2009); Cataract extraction,  bilateral (Bilateral); Esophagogastroduodenoscopy (egd) with propofol (N/A, 05/07/2017); Colonoscopy with propofol (N/A, 05/07/2017); IR Venogram Hepatic W Hemodynamic Evaluation (03/15/2018); and IR Transcatheter BX (03/15/2018).   Her family history includes Bipolar disorder in her father; Cirrhosis in her mother; Heart failure in her father; Hyperlipidemia in her father; Hypertension in her brother; Obesity in her daughter.She reports that she quit smoking about 6 years ago. Her smoking use included cigarettes. She started smoking about 40 years ago. She has a 100.00 pack-year smoking history. She has never used smokeless tobacco. She reports that she does not drink alcohol or use drugs.    ROS Review of Systems  Constitutional: Negative.   HENT: Negative.   Eyes: Negative for visual disturbance.  Respiratory: Negative for shortness of breath.   Cardiovascular: Negative for chest pain.  Gastrointestinal: Negative for abdominal pain.  Musculoskeletal: Positive for arthralgias, back pain and myalgias.    Objective:  BP 111/68   Pulse 85   Temp (!) 97.4 F (36.3 C) (Oral)   Ht 5\' 4"  (1.626 m)   Wt 212 lb (96.2 kg)   BMI 36.39 kg/m   BP Readings from Last 3 Encounters:  07/02/18 111/68  05/31/18 133/72  04/05/18 121/70    Wt Readings from Last 3 Encounters:  07/02/18 212 lb (96.2 kg)  05/31/18 218 lb (98.9 kg)  04/05/18 221 lb 8 oz (100.5 kg)     Physical Exam  Constitutional: She is oriented to person, place, and time. She appears well-developed and well-nourished.  No distress.  Cardiovascular: Normal rate and regular rhythm.  Pulmonary/Chest: Breath sounds normal.  Neurological: She is alert and oriented to person, place, and time.  Skin: Skin is warm and dry.  Psychiatric: She has a normal mood and affect.      Assessment & Plan:   Kimberly Obrien was seen today for fibromyalgia.  Diagnoses and all orders for this visit:  Rash and nonspecific skin eruption -      Ambulatory referral to Dermatology  Other orders -     pregabalin (LYRICA) 50 MG capsule; 1 qhs X7 days , then 2 qhs X 7d, then 3 qhs X 7d, then 4 qhs       I have discontinued Mardene Celeste B. Middleton's Vitamin D (Ergocalciferol), triamcinolone cream, predniSONE, and azithromycin. I am also having her start on pregabalin. Additionally, I am having her maintain her albuterol, fluticasone, and budesonide-formoterol.  Allergies as of 07/02/2018      Reactions   Aciphex [rabeprazole Sodium] Rash   Fluconazole Rash, Hives   Lansoprazole Hives   Sucralfate Nausea Only   Vancomycin Diarrhea   Claritin [loratadine] Other (See Comments)   Tired   Crestor [rosuvastatin] Other (See Comments)   Elevated blood glucose and abdominal pain   Doxycycline Rash   Ibuprofen Other (See Comments)   Upset stomach   Lipitor [atorvastatin] Other (See Comments)   Myalgias, leg pain.  Problems with pancreas, sugars went up also.   Macrobid [nitrofurantoin Macrocrystal] Nausea And Vomiting   Metoprolol Nausea And Vomiting   Metronidazole Other (See Comments), Nausea Only   Dizziness, nausea, dry mouth and some shortness of breath   Nsaids Other (See Comments)   Upset stomach   Omeprazole    bloating   Rabeprazole Rash   Septra [sulfamethoxazole-trimethoprim] Nausea And Vomiting      Medication List        Accurate as of 07/02/18  1:40 PM. Always use your most recent med list.          albuterol 108 (90 Base) MCG/ACT inhaler Commonly known as:  PROVENTIL HFA;VENTOLIN HFA Inhale into the lungs every 6 (six) hours as needed for wheezing or shortness of breath.   budesonide-formoterol 80-4.5 MCG/ACT inhaler Commonly known as:  SYMBICORT Inhale 2 puffs into the lungs 2 (two) times daily.   fluticasone 50 MCG/ACT nasal spray Commonly known as:  FLONASE USE TWO SPRAY(S) IN EACH NOSTRIL ONCE DAILY   pregabalin 50 MG capsule Commonly known as:  LYRICA 1 qhs X7 days , then 2 qhs X 7d, then 3 qhs  X 7d, then 4 qhs        Follow-up: Return in about 1 month (around 08/01/2018).  Claretta Fraise, M.D.

## 2018-07-11 ENCOUNTER — Ambulatory Visit: Payer: Medicare HMO | Admitting: Cardiovascular Disease

## 2018-07-11 ENCOUNTER — Encounter: Payer: Self-pay | Admitting: Cardiovascular Disease

## 2018-07-11 VITALS — BP 118/70 | HR 92 | Ht 64.0 in | Wt 212.4 lb

## 2018-07-11 DIAGNOSIS — R0602 Shortness of breath: Secondary | ICD-10-CM

## 2018-07-11 DIAGNOSIS — R002 Palpitations: Secondary | ICD-10-CM

## 2018-07-11 NOTE — Patient Instructions (Signed)
Medication Instructions:  Your physician recommends that you continue on your current medications as directed. Please refer to the Current Medication list given to you today.  If you need a refill on your cardiac medications before your next appointment, please call your pharmacy.   Lab work: none If you have labs (blood work) drawn today and your tests are completely normal, you will receive your results only by: Marland Kitchen MyChart Message (if you have MyChart) OR . A paper copy in the mail If you have any lab test that is abnormal or we need to change your treatment, we will call you to review the results.  Testing/Procedures: Your physician has recommended that you wear an event monitor. Event monitors are medical devices that record the heart's electrical activity. Doctors most often [place] these monitors to diagnose arrhythmias. Arrhythmias are problems with the speed or rhythm of the heartbeat. The monitor is a small, portable device. You can wear one while you do your normal daily activities. This is usually used to diagnose what is causing palpitations/syncope (passing out).  Your physician has requested that you have an echocardiogram. Echocardiography is a painless test that uses sound waves to create images of your heart. It provides your doctor with information about the size and shape of your heart and how well your heart's chambers and valves are working. This procedure takes approximately one hour. There are no restrictions for this procedure.    Follow-Up: Your physician recommends that you schedule a follow-up appointment in: 3-4 months.

## 2018-07-11 NOTE — Progress Notes (Signed)
Chief Complaint  Patient presents with  . Follow-up    palpitations   History of Present Illness: 56 yo female with history of autoimmune hepatitis, COPD/asthma, arthritis and pulmonary embolism who is here today for cardiac follow up. She had been followed in our [facility] office by Dr. Bronson Ing. I met her in February 2017. She has been evaluated over the years for tachycardia/palpitations/fatigue/dyspnea. Echo April 2015 showed normal LV systolic and diastolic function with no valve issues. Right and left heart cath 2015 with no evidence of CAD and normal PA pressures. Cardiac monitor 2016 with rare PVCS, PACs. She has no history of atrial fibrillation. CTA chest 2015 with no PE. She did have a PE in 1999 with recurrence in 2003. She remained on coumadin because of history of recurrent PE but this was stopped earlier in September 2018 due to GI bleeding.  Colonoscopy September 2018 with 4 polyps removed and non bleeding internal hemorrhoids as well as gastritis seen on imaging. She has been seen in the Hematology office by Dr. Irene Limbo. Oncology office. She has a history of prior tobacco abuse and COPD. She has seen Pulmonary but she is only felt to have mild COPD per pt.   She is here today for follow up. The patient denies any chest pain, lower extremity edema, orthopnea, PND. She describes dyspnea with exertion followed by palpitations. She has palpitations every day. The episodes last for several minutes. No chest pain. She feels weak after these episodes but no dizziness. No syncope. No LE edema.   Primary Care Physician: Claretta Fraise, MD [facility])  Past Medical History:  Diagnosis Date  . Anticoagulation monitoring by pharmacist   . Arthritis   . Asthma   . Autoimmune hepatitis ([place])   . Cataract   . COPD (chronic obstructive pulmonary disease) ([place])   . Dyspnea    exertion  . Hyperlipidemia   . PE (pulmonary embolism)   . Sleep apnea     Past Surgical History:    Procedure Laterality Date  . ABDOMINAL HYSTERECTOMY    . CATARACT EXTRACTION, BILATERAL Bilateral   . CHOLECYSTECTOMY    . COLONOSCOPY WITH PROPOFOL N/A 05/07/2017   Procedure: COLONOSCOPY WITH PROPOFOL;  Surgeon: Laurence Spates, MD;  Location: [facility] ENDOSCOPY;  Service: Endoscopy;  Laterality: N/A;  . ESOPHAGOGASTRODUODENOSCOPY (EGD) WITH PROPOFOL N/A 05/07/2017   Procedure: ESOPHAGOGASTRODUODENOSCOPY (EGD) WITH PROPOFOL;  Surgeon: Laurence Spates, MD;  Location: [facility] ENDOSCOPY;  Service: Endoscopy;  Laterality: N/A;  . EYE SURGERY     Cataracts  . INCONTINENCE SURGERY  2009  . IR TRANSCATHETER BX  03/15/2018  . IR VENOGRAM HEPATIC W HEMODYNAMIC EVALUATION  03/15/2018  . LEFT AND RIGHT HEART CATHETERIZATION WITH CORONARY ANGIOGRAM N/A 05/13/2014   Procedure: LEFT AND RIGHT HEART CATHETERIZATION WITH CORONARY ANGIOGRAM;  Surgeon: [name], MD;  Location: [facility] CATH LAB;  Service: Cardiovascular;  Laterality: N/A;  . SPINE SURGERY     L5 S1 fusion  . TUBAL LIGATION    . WRIST SURGERY Right    Had surgery twice to shave bone for blood circulation improvement.    Current Outpatient Medications  Medication Sig Dispense Refill  . albuterol (PROVENTIL HFA;VENTOLIN HFA) 108 (90 BASE) MCG/ACT inhaler Inhale into the lungs every 6 (six) hours as needed for wheezing or shortness of breath.    . budesonide-formoterol (SYMBICORT) 80-4.5 MCG/ACT inhaler Inhale 2 puffs into the lungs 2 (two) times daily.    . fluticasone (FLONASE) 50 MCG/ACT nasal spray USE  TWO SPRAY(S) IN EACH NOSTRIL ONCE DAILY 16 g 5  . pregabalin (LYRICA) 50 MG capsule 1 qhs X7 days , then 2 qhs X 7d, then 3 qhs X 7d, then 4 qhs 120 capsule 0   No current facility-administered medications for this visit.     Allergies  Allergen Reactions  . Aciphex [Rabeprazole Sodium] Rash  . Fluconazole Rash and Hives  . Lansoprazole Hives  . Sucralfate Nausea Only  . Vancomycin Diarrhea  . Claritin [Loratadine] Other (See Comments)     Tired   . Crestor [Rosuvastatin] Other (See Comments)    Elevated blood glucose and abdominal pain  . Doxycycline Rash  . Ibuprofen Other (See Comments)    Upset stomach  . Lipitor [Atorvastatin] Other (See Comments)    Myalgias, leg pain.  Problems with pancreas, sugars went up also.  Santiago Bur [Nitrofurantoin Macrocrystal] Nausea And Vomiting  . Metoprolol Nausea And Vomiting  . Metronidazole Other (See Comments) and Nausea Only    Dizziness, nausea, dry mouth and some shortness of breath  . Nsaids Other (See Comments)    Upset stomach  . Omeprazole     bloating  . Rabeprazole Rash  . Septra [Sulfamethoxazole-Trimethoprim] Nausea And Vomiting    Social History   Socioeconomic History  . Marital status: Married    Spouse name: Not on file  . Number of children: 4  . Years of education: GED  . Highest education level: GED or equivalent  Occupational History  . Occupation: Unemployed  Social Needs  . Financial resource strain: Not hard at all  . Food insecurity:    Worry: Never true    Inability: Never true  . Transportation needs:    Medical: No    Non-medical: No  Tobacco Use  . Smoking status: Former Smoker    Packs/day: 2.00    Years: 50.00    Pack years: 100.00    Types: Cigarettes    Start date: 08/22/1977    Last attempt to quit: 04/21/2012    Years since quitting: 6.2  . Smokeless tobacco: Never Used  Substance and Sexual Activity  . Alcohol use: No  . Drug use: No  . Sexual activity: Yes  Lifestyle  . Physical activity:    Days per week: 0 days    Minutes per session: 0 min  . Stress: To some extent  Relationships  . Social connections:    Talks on phone: More than three times a week    Gets together: More than three times a week    Attends religious service: More than 4 times per year    Active member of club or organization: Yes    Attends meetings of clubs or organizations: More than 4 times per year    Relationship status: Married  .  Intimate partner violence:    Fear of current or ex partner: No    Emotionally abused: No    Physically abused: No    Forced sexual activity: No  Other Topics Concern  . Not on file  Social History Narrative  . Not on file    Family History  Problem Relation Age of Onset  . Cirrhosis Mother   . Hyperlipidemia Father   . Heart failure Father   . Bipolar disorder Father   . Obesity Daughter   . Hypertension Brother   . Heart attack Neg Hx   . Miscarriages / Stillbirths Neg Hx     Review of Systems:  As stated in the  HPI and otherwise negative.   BP 118/70   Pulse 92   Ht 5' 4"  (1.626 m)   Wt 212 lb 6.4 oz (96.3 kg)   SpO2 94%   BMI 36.46 kg/m   Physical Examination:  General: Well developed, well nourished, NAD  HEENT: OP clear, mucus membranes moist  SKIN: warm, dry. No rashes. Neuro: No focal deficits  Musculoskeletal: Muscle strength 5/5 all ext  Psychiatric: Mood and affect normal  Neck: No JVD, no carotid bruits, no thyromegaly, no lymphadenopathy.  Lungs:Clear bilaterally, no wheezes, rhonci, crackles Cardiovascular: Regular rate and rhythm. No murmurs, gallops or rubs. Abdomen:Soft. Bowel sounds present. Non-tender.  Extremities: No lower extremity edema. Pulses are 2 + in the bilateral DP/PT.  EKG:  EKG is  ordered today. The ekg ordered today demonstrates NSR, rate 92 bpm  Recent Labs: 12/31/2017: TSH 1.280 04/05/2018: ALT 55; BUN 6; Creatinine, Ser 0.62; Hemoglobin 13.9; Platelets 109; Potassium 3.1; Sodium 144   Lipid Panel    Component Value Date/Time   CHOL 152 12/31/2017 1255   CHOL 208 (H) 02/04/2013 1317   TRIG 112 12/31/2017 1255   TRIG 110 12/21/2014 0000   TRIG 136 02/04/2013 1317   HDL 53 12/31/2017 1255   HDL 60 12/21/2014 0000   HDL 40 02/04/2013 1317   CHOLHDL 2.9 12/31/2017 1255   LDLCALC 77 12/31/2017 1255   LDLCALC 65 08/04/2013 1332   LDLCALC 141 (H) 02/04/2013 1317     Wt Readings from Last 3 Encounters:  07/11/18 212 lb  6.4 oz (96.3 kg)  07/02/18 212 lb (96.2 kg)  05/31/18 218 lb (98.9 kg)     Other studies Reviewed: Additional studies/ records that were reviewed today include: . Review of the above records demonstrates:    Assessment and Plan:   1. Dyspnea: Her dyspnea is likely multi-factorial given COPD, anemia and deconditioned state. Will arrange echo to assess LV systolic function and exclude valve disease. No evidence of volume overload. She is known to have normal LV systolic function by echo in 2015 and normal coronary arteries by cath in 2015.    2. Palpitations: She feels her heart racing when she walks. No prior atrial fibrillation. These episodes make her feel weak and more dyspneic. Will arrange 30 day cardiac monitor to exclude arrhythmias.    Current medicines are reviewed at length with the patient today.  The patient does not have concerns regarding medicines.  The following changes have been made:  no change  Labs/ tests ordered today include: None  Orders Placed This Encounter  Procedures  . CARDIAC EVENT MONITOR  . EKG 12-Lead  . ECHOCARDIOGRAM COMPLETE   Disposition:   FU with me in 3-4  months  Signed, Lauree Chandler, MD 07/11/2018 9:45 AM    [address], [place], [place]  [address] Phone: [phone]; Fax: [phone]

## 2018-07-17 NOTE — Progress Notes (Signed)
CONSULT NOTE  Patient Care Team: Claretta Fraise, MD as PCP - General (Family Medicine) [name], MD as PCP - Cardiology (Cardiology) Sinda Du, MD as Consulting Physician (Pulmonary Disease) Teena Irani, MD (Inactive) as Consulting Physician (Gastroenterology) [name], MD as Consulting Physician (Cardiology)    CHIEF COMPLAINTS/PURPOSE OF CONSULTATION:  F/u for IDA  HISTORY OF PRESENTING ILLNESS:   Kimberly Obrien 57 y.o. female is here because of a referral from Dr. 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   The pt reports that she is doing well overall. She reports seeing a Hematologist first after having a PE on her right side, damaging the lower lobe in 1999. She reports having a second in 2003 with 3 small PE in her right upper lobe. She reports having been on estrogen containing birth control in 1999 which she was told likely led to her PE, and was smoking at the time. She reports taking a diet pill with hormones in 2003 when she had her second PE. She denies having genetic testing regarding these Pes. She reports taking Warfarin 6 months after the first PE. She reports having taken Coumadin from 2003 until 3 months ago when she stopped due to her GI bleeding.  She denies having had any other blood clots, through 4 pregnancies and a few surgeries.  She denies a FHx of blood clotting or bleeding problems. She reports being SOB when she is losing lots of blood. She reports having a blood transfusion on 05/07/17, and receiving two iron infusions in the last 4 months. She denies allergic reactions to IV iron, but does note stomach issues and drowsiness for a couple days following IV iron.   Dr. Oletta Lamas has been following her iron levels. She reports using an inhaler.  She reports that Dr. 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   Of note prior to the patient's visit, pt has had an Abdomen CT completed on 04/19/17 with results revealing Mild splenomegaly.  On 05/07/17 she had a colonoscopy with 4 polyps removed, and non-bleeding internal hemorrhoids and upper endoscopy (05/07/17) with Gastritis. Biopsied. Normal examined duodenum. Blood found in stool.  Most recent lab results (08/30/17) of CBC, CMP is as follows: all values are WNL except for Glucose at 128, Total bilirubin at 1.4, AST at 68, ALT at 64, MCH at 26.4, Plt at 136k, Neut % at 75.5%.   On review of systems, pt reports occasional SOB, blood in the stools, abdominal pains, and denies mouth ulcers, and any other symptoms.   On PMHx the pt reports IBS, Positive hemoccult card, RRR with murmur, abdominal pain, respiratory disease, migraines, vertigo, COPD, arthritis, DJD, abnormal heart rhythm, fatty liver, pulmonary embolism, pulmonary edema, tachycardia, Vit D deficiency, arthralgia, insomnia, fatigue, hyperlipidemia, anxiety, dysuria, and sleep apnea.  On Surgical Hx the pt reports back surgery x4 (1997, 2006, 2006, 2007), tubal ligation (1985), wrist surgery x2 (1995 x2), cholecystectomy (2001), colonoscopy x2 (2007, 2014), EGD x2 (2002, 2017), hysterectomy partial (2009), rt eye cataract (2013), and cardiac catheterization (2015). On Social Hx the pt reports quitting smoking in 04/18/12 after 30 years, no ETOH use, and being occupationally disabled.    INTERVAL HISTORY   Kimberly Obrien is here for management and evaluation of her iron deficiency anemia. The patient's last visit with [place] was on 03/22/18. The pt reports that she is doing well  overall.   The pt reports that she is now off of Prednisone for her auto-immune hepatitis treatment, and notes that she will be returning to care with her hepatologist in February. She is not currently taking any medications for her hepatitis.   The pt will be starting Lyrica soon for  concerns of Fibro myalgia, and is in conversation with Rheumatology.   The pt has not had any concerns for leg swelling and is not taking any blood thinner at this time. The pt also denies any blood in the stools or black stools.   The pt is no longer taking Nexium. She is expecting an echocardiogram later this week.  The pt remains in complete smoking cessation.   Of note since the patient's last visit, pt has had a US Abdomen completed on 04/08/18 with results revealing Hepatic steatosis without other significant abnormality.  Lab results today (07/22/18) of CBC w/diff is as follows: all values are WNL except for PLT at 93k.   On review of systems, pt reports feeling tired, and denies leg swelling, blood in the stools, black stools, difficulty breathing, and any other symptoms.   MEDICAL HISTORY:  Past Medical History:  Diagnosis Date  . Anticoagulation monitoring by pharmacist   . Arthritis   . Asthma   . Autoimmune hepatitis ([place])   . Cataract   . COPD (chronic obstructive pulmonary disease) ([place])   . Dyspnea    exertion  . Hyperlipidemia   . PE (pulmonary embolism)   . Sleep apnea     SURGICAL HISTORY: Past Surgical History:  Procedure Laterality Date  . ABDOMINAL HYSTERECTOMY    . CATARACT EXTRACTION, BILATERAL Bilateral   . CHOLECYSTECTOMY    . COLONOSCOPY WITH PROPOFOL N/A 05/07/2017   Procedure: COLONOSCOPY WITH PROPOFOL;  Surgeon: Laurence Spates, MD;  Location: [facility];  Service: Endoscopy;  Laterality: N/A;  . ESOPHAGOGASTRODUODENOSCOPY (EGD) WITH PROPOFOL N/A 05/07/2017   Procedure: ESOPHAGOGASTRODUODENOSCOPY (EGD) WITH PROPOFOL;  Surgeon: Laurence Spates, MD;  Location: [facility];  Service: Endoscopy;  Laterality: N/A;  . EYE SURGERY     Cataracts  . INCONTINENCE SURGERY  2009  . IR TRANSCATHETER BX  03/15/2018  . IR VENOGRAM HEPATIC W HEMODYNAMIC EVALUATION  03/15/2018  . LEFT AND RIGHT HEART CATHETERIZATION WITH CORONARY ANGIOGRAM N/A 05/13/2014    Procedure: LEFT AND RIGHT HEART CATHETERIZATION WITH CORONARY ANGIOGRAM;  Surgeon: [name], MD;  Location: [facility] CATH LAB;  Service: Cardiovascular;  Laterality: N/A;  . SPINE SURGERY     L5 S1 fusion  . TUBAL LIGATION    . WRIST SURGERY Right    Had surgery twice to shave bone for blood circulation improvement.    SOCIAL HISTORY: Social History   Socioeconomic History  . Marital status: Married    Spouse name: Not on file  . Number of children: 4  . Years of education: GED  . Highest education level: GED or equivalent  Occupational History  . Occupation: Unemployed  Social Needs  . Financial resource strain: Not hard at all  . Food insecurity:    Worry: Never true    Inability: Never true  . Transportation needs:    Medical: No    Non-medical: No  Tobacco Use  . Smoking status: Former Smoker    Packs/day: 2.00    Years: 50.00    Pack years: 100.00    Types: Cigarettes    Start date: 08/22/1977    Last attempt to quit: 04/21/2012    Years since  quitting: 6.2  . Smokeless tobacco: Never Used  Substance and Sexual Activity  . Alcohol use: No  . Drug use: No  . Sexual activity: Yes  Lifestyle  . Physical activity:    Days per week: 0 days    Minutes per session: 0 min  . Stress: To some extent  Relationships  . Social connections:    Talks on phone: More than three times a week    Gets together: More than three times a week    Attends religious service: More than 4 times per year    Active member of club or organization: Yes    Attends meetings of clubs or organizations: More than 4 times per year    Relationship status: Married  . Intimate partner violence:    Fear of current or ex partner: No    Emotionally abused: No    Physically abused: No    Forced sexual activity: No  Other Topics Concern  . Not on file  Social History Narrative  . Not on file    FAMILY HISTORY: Family History  Problem Relation Age of Onset  . Cirrhosis Mother   .  Hyperlipidemia Father   . Heart failure Father   . Bipolar disorder Father   . Obesity Daughter   . Hypertension Brother   . Heart attack Neg Hx   . Miscarriages / Stillbirths Neg Hx     ALLERGIES:  is allergic to aciphex [rabeprazole sodium]; fluconazole; lansoprazole; sucralfate; vancomycin; claritin [loratadine]; crestor [rosuvastatin]; doxycycline; ibuprofen; lipitor [atorvastatin]; macrobid [nitrofurantoin macrocrystal]; metoprolol; metronidazole; nsaids; omeprazole; rabeprazole; and septra [sulfamethoxazole-trimethoprim].  MEDICATIONS:  Current Outpatient Medications  Medication Sig Dispense Refill  . albuterol (PROVENTIL HFA;VENTOLIN HFA) 108 (90 BASE) MCG/ACT inhaler Inhale into the lungs every 6 (six) hours as needed for wheezing or shortness of breath.    . budesonide-formoterol (SYMBICORT) 80-4.5 MCG/ACT inhaler Inhale 2 puffs into the lungs 2 (two) times daily.    . fluticasone (FLONASE) 50 MCG/ACT nasal spray USE TWO SPRAY(S) IN EACH NOSTRIL ONCE DAILY 16 g 5  . pregabalin (LYRICA) 50 MG capsule 1 qhs X7 days , then 2 qhs X 7d, then 3 qhs X 7d, then 4 qhs 120 capsule 0   No current facility-administered medications for this visit.     REVIEW OF SYSTEMS:   A 10+ POINT REVIEW OF SYSTEMS WAS OBTAINED including neurology, dermatology, psychiatry, cardiac, respiratory, lymph, extremities, GI, GU, Musculoskeletal, constitutional, breasts, reproductive, HEENT.  All pertinent positives are noted in the HPI.  All others are negative.   PHYSICAL EXAMINATION:  Vitals:   07/22/18 1046  BP: (!) 157/65  Pulse: 83  Resp: 18  Temp: 97.8 F (36.6 C)  SpO2: 97%   Filed Weights   07/22/18 1046  Weight: 212 lb 12.8 oz (96.5 kg)    GENERAL:alert, in no acute distress and comfortable SKIN: multiple chronic crusted lesions on extremities and trunk EYES: conjunctiva are pink and non-injected, sclera anicteric OROPHARYNX: MMM, no exudates, no oropharyngeal erythema or  ulceration NECK: supple, no JVD LYMPH:  no palpable lymphadenopathy in the cervical, axillary or inguinal regions LUNGS: clear to auscultation b/l with normal respiratory effort HEART: regular rate & rhythm ABDOMEN:  normoactive bowel sounds , non tender, not distended. No palpable hepatosplenomegaly.  Extremity: no pedal edema PSYCH: alert & oriented x 3 with fluent speech NEURO: no focal motor/sensory deficits   LABORATORY DATA:  I have reviewed the data as listed Recent Results (from the past 2160 hour(s))  [facility] MAMMOGRAPHY     Status: Normal   Collection Time: 05/20/18 12:00 AM  Result Value Ref Range   HM Mammogram 0-4 Bi-Rad 0-4 Bi-Rad, Self Reported Normal  CBC with Differential/Platelet     Status: Abnormal   Collection Time: 07/22/18 10:28 AM  Result Value Ref Range   WBC 4.2 4.0 - 10.5 K/uL   RBC 4.64 3.87 - 5.11 MIL/uL   Hemoglobin 13.2 12.0 - 15.0 g/dL   HCT 39.5 36.0 - 46.0 %   MCV 85.1 80.0 - 100.0 fL   MCH 28.4 26.0 - 34.0 pg   MCHC 33.4 30.0 - 36.0 g/dL   RDW 14.1 11.5 - 15.5 %   Platelets 93 (L) 150 - 400 K/uL   nRBC 0.0 0.0 - 0.2 %   Neutrophils Relative % 72 %   Neutro Abs 3.0 1.7 - 7.7 K/uL   Lymphocytes Relative 21 %   Lymphs Abs 0.9 0.7 - 4.0 K/uL   Monocytes Relative 5 %   Monocytes Absolute 0.2 0.1 - 1.0 K/uL   Eosinophils Relative 1 %   Eosinophils Absolute 0.1 0.0 - 0.5 K/uL   Basophils Relative 1 %   Basophils Absolute 0.0 0.0 - 0.1 K/uL   Immature Granulocytes 0 %   Abs Immature Granulocytes 0.01 0.00 - 0.07 K/uL    Comment: Performed at [facility], [place] [address] [place], [place] [address]   . Lab Results  Component Value Date   IRON 90 03/22/2018   TIBC 360 03/22/2018   IRONPCTSAT 25 03/22/2018   (Iron and TIBC)  Lab Results  Component Value Date   FERRITIN 43 07/22/2018   . CBC Latest Ref Rng & Units 07/22/2018 04/05/2018 03/22/2018  WBC 4.0 - 10.5 K/uL 4.2 6.2 5.7  Hemoglobin 12.0 - 15.0 g/dL 13.2 13.9  13.8  Hematocrit 36.0 - 46.0 % 39.5 41.7 41.2  Platelets 150 - 400 K/uL 93(L) 109(L) 87(L)   . CMP Latest Ref Rng & Units 07/22/2018 04/05/2018 03/15/2018  Glucose 70 - 99 mg/dL 203(H) 150(H) 124(H)  BUN 6 - 20 mg/dL 7 6 7   Creatinine 0.44 - 1.00 mg/dL 0.71 0.62 0.54  Sodium 135 - 145 mmol/L 143 144 144  Potassium 3.5 - 5.1 mmol/L 3.3(L) 3.1(L) 3.3(L)  Chloride 98 - 111 mmol/L 110 104 109  CO2 22 - 32 mmol/L 25 24 26   Calcium 8.9 - 10.3 mg/dL 8.5(L) 8.9 8.9  Total Protein 6.5 - 8.1 g/dL 5.9(L) 5.8(L) -  Total Bilirubin 0.3 - 1.2 mg/dL 1.7(H) 1.6(H) -  Alkaline Phos 38 - 126 U/L 109 115 -  AST 15 - 41 U/L 45(H) 53(H) -  ALT 0 - 44 U/L 29 55(H) -      Diagnosis 05/07/17 1. Stomach, biopsy, antrum - REACTIVE GASTROPATHY. - THERE IS NO EVIDENCE OF HELICOBACTER PYLORI, DYSPLASIA OR MALIGNANCY. - SEE COMMENT. 2. Colon, polyp(s), cecal x 2 - TUBULAR ADENOMA(S). - HIGH GRADE DYSPLASIA IS NOT IDENTIFIED. 3. Colon, polyp(s), descending x 2 - TUBULAR ADENOMA(S). - HIGH GRADE DYSPLASIA IS NOT IDENTIFIED. Microscopic Comment 1. This pattern can be associated with non-steroidal anti-inflammatory drugs (NSAIDS), alcohol or with other causes of chemical gastropathy. Helicobacter pylori are not identified with Warthin-Starry stain.   ASSESSMENT & PLAN:   57 y.o. female with   1. History of recurrent Pulmonary Embolisms Her previous events appears to have been triggered by taking estrogen containing hormones and smoking. No overt Fhx of thrombophilias and no previous VTE with several  pregnancies and surgeries - suggest against significant hereditary thrombophilia. Factor V Leiden was negative, Prothrombin gene mutation neg, APLA Ab panel - unrevealing.  PLAN:  -patient continues to have issues with GI bleeding and is off blood thinners as a result -no clear justification for restarting anticoagulation till her GI bleeding is completely control and then she could be considered for  prophylactic ASA.  2. GI bleeding -recent w/u done -05/07/17 Upper Endoscopy and Colonoscopy show gastritis with 4 large colon polyps which were removed which were found to be tubular adenomas.    3. Autoimmune hepatitis -continue f/u with Dr Oletta Lamas for further evaluation and mx of GI bleeding.  4. Iron deficiency Anemia related to GI bleeding. Has needed previous IV Iron and PRBC transfusions. . Lab Results  Component Value Date   IRON 70 07/22/2018   TIBC 380 07/22/2018   IRONPCTSAT 18 (L) 07/22/2018   (Iron and TIBC)  Lab Results  Component Value Date   FERRITIN 43 07/22/2018   Plan  -continue f/u with PCP and GI regarding GI bleeding isseus -We discussed IV Iron as needed with close monitoring of her blood levels as she continue to have blood loss.  -Discussed pt labwork today, 07/22/18; HGB stable at 13.2, PLT stable at 93k -07/22/18 Ferritin is down to 43 -Discussed the 04/08/18 US Abdomen which revealed Hepatic steatosis without other significant abnormality. -Recommended that the pt continue to eat well, drink at least 48-64 oz of water each day, and walk 20-30 minutes each day.  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 No indication for treatment of this currently.   RTC with Dr Irene Limbo in 6 months with labs -will offer her additional IV Iron to maintain ferritin >100   All questions were answered. The patient knows to call the clinic with any problems, questions or concerns.  The total time spent in the appt was 25 minutes and more than 50% was on counseling and direct patient cares.   Sullivan Lone MD [place] AAHIVMS [facility] Hematology/Oncology Physician [facility]  (Office):       [phone] (Work cell):  [phone] (Fax):           [phone]  I, Baldwin [place], am acting as a scribe for Dr. Sullivan Lone.    .I have reviewed the above documentation for accuracy and completeness, and I agree with the above. Brunetta Genera MD

## 2018-07-22 ENCOUNTER — Inpatient Hospital Stay: Payer: Medicare HMO | Admitting: Hematology

## 2018-07-22 ENCOUNTER — Telehealth: Payer: Self-pay | Admitting: Hematology

## 2018-07-22 ENCOUNTER — Inpatient Hospital Stay: Payer: Medicare HMO | Attending: Hematology

## 2018-07-22 ENCOUNTER — Encounter: Payer: Self-pay | Admitting: Hematology

## 2018-07-22 VITALS — BP 157/65 | HR 83 | Temp 97.8°F | Resp 18 | Ht 64.0 in | Wt 212.8 lb

## 2018-07-22 DIAGNOSIS — Z87891 Personal history of nicotine dependence: Secondary | ICD-10-CM

## 2018-07-22 DIAGNOSIS — Z79899 Other long term (current) drug therapy: Secondary | ICD-10-CM | POA: Diagnosis not present

## 2018-07-22 DIAGNOSIS — Z86711 Personal history of pulmonary embolism: Secondary | ICD-10-CM | POA: Insufficient documentation

## 2018-07-22 DIAGNOSIS — D696 Thrombocytopenia, unspecified: Secondary | ICD-10-CM

## 2018-07-22 DIAGNOSIS — K754 Autoimmune hepatitis: Secondary | ICD-10-CM

## 2018-07-22 DIAGNOSIS — D500 Iron deficiency anemia secondary to blood loss (chronic): Secondary | ICD-10-CM | POA: Insufficient documentation

## 2018-07-22 LAB — CBC WITH DIFFERENTIAL/PLATELET
Abs Immature Granulocytes: 0.01 10*3/uL (ref 0.00–0.07)
Basophils Absolute: 0 10*3/uL (ref 0.0–0.1)
Basophils Relative: 1 %
Eosinophils Absolute: 0.1 10*3/uL (ref 0.0–0.5)
Eosinophils Relative: 1 %
HCT: 39.5 % (ref 36.0–46.0)
Hemoglobin: 13.2 g/dL (ref 12.0–15.0)
Immature Granulocytes: 0 %
Lymphocytes Relative: 21 %
Lymphs Abs: 0.9 10*3/uL (ref 0.7–4.0)
MCH: 28.4 pg (ref 26.0–34.0)
MCHC: 33.4 g/dL (ref 30.0–36.0)
MCV: 85.1 fL (ref 80.0–100.0)
Monocytes Absolute: 0.2 10*3/uL (ref 0.1–1.0)
Monocytes Relative: 5 %
Neutro Abs: 3 10*3/uL (ref 1.7–7.7)
Neutrophils Relative %: 72 %
Platelets: 93 10*3/uL — ABNORMAL LOW (ref 150–400)
RBC: 4.64 MIL/uL (ref 3.87–5.11)
RDW: 14.1 % (ref 11.5–15.5)
WBC: 4.2 10*3/uL (ref 4.0–10.5)
nRBC: 0 % (ref 0.0–0.2)

## 2018-07-22 LAB — CMP (CANCER CENTER ONLY)
ALT: 29 U/L (ref 0–44)
AST: 45 U/L — ABNORMAL HIGH (ref 15–41)
Albumin: 3.3 g/dL — ABNORMAL LOW (ref 3.5–5.0)
Alkaline Phosphatase: 109 U/L (ref 38–126)
Anion gap: 8 (ref 5–15)
BUN: 7 mg/dL (ref 6–20)
CO2: 25 mmol/L (ref 22–32)
Calcium: 8.5 mg/dL — ABNORMAL LOW (ref 8.9–10.3)
Chloride: 110 mmol/L (ref 98–111)
Creatinine: 0.71 mg/dL (ref 0.44–1.00)
GFR, Est AFR Am: >60 mL/min (ref >60)
GFR, Estimated: >60 mL/min (ref >60)
Glucose, Bld: 203 mg/dL — ABNORMAL HIGH (ref 70–99)
Potassium: 3.3 mmol/L — ABNORMAL LOW (ref 3.5–5.1)
Sodium: 143 mmol/L (ref 135–145)
Total Bilirubin: 1.7 mg/dL — ABNORMAL HIGH (ref 0.3–1.2)
Total Protein: 5.9 g/dL — ABNORMAL LOW (ref 6.5–8.1)

## 2018-07-22 LAB — IRON AND TIBC
Iron: 70 ug/dL (ref 41–142)
Saturation Ratios: 18 % — ABNORMAL LOW (ref 21–57)
TIBC: 380 ug/dL (ref 236–444)
UIBC: 310 ug/dL (ref 120–384)

## 2018-07-22 LAB — FERRITIN: Ferritin: 43 ng/mL (ref 11–307)

## 2018-07-22 NOTE — Patient Instructions (Signed)
Thank you for choosing [facility] to provide your oncology and hematology care.  To afford each patient quality time with our providers, please arrive 30 minutes before your scheduled appointment time.  If you arrive late for your appointment, you may be asked to reschedule.  We strive to give you quality time with our providers, and arriving late affects you and other patients whose appointments are after yours.    If you are a no show for multiple scheduled visits, you may be dismissed from the clinic at the providers discretion.     Again, thank you for choosing [facility], our hope is that these requests will decrease the amount of time that you wait before being seen by our physicians.  ______________________________________________________________________   Should you have questions after your visit to the [facility], please contact our office at [phone] between the hours of 8:30 and 4:30 p.m.    Voicemails left after 4:30p.m will not be returned until the following business day.     For prescription refill requests, please have your pharmacy contact us directly.  Please also try to allow 48 hours for prescription requests.     Please contact the scheduling department for questions regarding scheduling.  For scheduling of procedures such as PET scans, CT scans, MRI, Ultrasound, etc please contact central scheduling at [phone].     Resources For Cancer Patients and Caregivers:    Oncolink.org:  A wonderful resource for patients and healthcare providers for information regarding your disease, ways to tract your treatment, what to expect, etc.      American Cancer Society:  800-227-2345  Can help patients locate various types of support and financial assistance   Cancer Care: 1-800-813-HOPE (4673) Provides financial assistance, online support groups, medication/co-pay assistance.     [place] DSS:  336-641-3447 Where to apply  for food stamps, Medicaid, and utility assistance   Medicare Rights Center: 800-333-4114 Helps people with Medicare understand their rights and benefits, navigate the Medicare system, and secure the quality healthcare they deserve   SCAT: 336-333-6589 [place] [employer]'s shared-ride transportation service for eligible riders who have a disability that prevents them from riding the fixed route bus.     For additional information on assistance programs please contact our social worker:   Abigail [place]more:  [phone]  

## 2018-07-22 NOTE — Telephone Encounter (Signed)
Printed calendar and avs. °

## 2018-07-24 ENCOUNTER — Ambulatory Visit (HOSPITAL_COMMUNITY): Payer: Medicare HMO | Attending: Cardiology

## 2018-07-24 ENCOUNTER — Other Ambulatory Visit: Payer: Self-pay

## 2018-07-24 ENCOUNTER — Ambulatory Visit (INDEPENDENT_AMBULATORY_CARE_PROVIDER_SITE_OTHER): Payer: Medicare HMO

## 2018-07-24 DIAGNOSIS — R0602 Shortness of breath: Secondary | ICD-10-CM | POA: Insufficient documentation

## 2018-07-24 DIAGNOSIS — R002 Palpitations: Secondary | ICD-10-CM

## 2018-07-26 ENCOUNTER — Encounter: Payer: Self-pay | Admitting: Family Medicine

## 2018-07-26 ENCOUNTER — Ambulatory Visit (INDEPENDENT_AMBULATORY_CARE_PROVIDER_SITE_OTHER): Payer: Medicare HMO | Admitting: Family Medicine

## 2018-07-26 VITALS — BP 128/70 | HR 85 | Temp 96.6°F | Ht 64.0 in | Wt 211.0 lb

## 2018-07-26 DIAGNOSIS — M545 Low back pain, unspecified: Secondary | ICD-10-CM

## 2018-07-26 DIAGNOSIS — R6889 Other general symptoms and signs: Secondary | ICD-10-CM | POA: Diagnosis not present

## 2018-07-26 DIAGNOSIS — N3090 Cystitis, unspecified without hematuria: Secondary | ICD-10-CM | POA: Diagnosis not present

## 2018-07-26 DIAGNOSIS — M5459 Other low back pain: Secondary | ICD-10-CM

## 2018-07-26 LAB — MICROSCOPIC EXAMINATION

## 2018-07-26 LAB — URINALYSIS, COMPLETE
Bilirubin, UA: NEGATIVE
Glucose, UA: NEGATIVE
Ketones, UA: NEGATIVE
Nitrite, UA: NEGATIVE
Protein, UA: NEGATIVE
Specific Gravity, UA: 1.025 (ref 1.005–1.030)
Urobilinogen, Ur: >8 mg/dL — ABNORMAL HIGH (ref 0.2–1.0)
pH, UA: 5.5 (ref 5.0–7.5)

## 2018-07-26 MED ORDER — NABUMETONE 500 MG PO TABS: 500.0000 mg | ORAL_TABLET | Freq: Two times a day (BID) | ORAL | 1 refills | Status: DC

## 2018-07-26 MED ORDER — CYCLOBENZAPRINE HCL 10 MG PO TABS: 10.0000 mg | ORAL_TABLET | Freq: Three times a day (TID) | ORAL | 1 refills | Status: DC | PRN

## 2018-07-26 MED ORDER — LEVOFLOXACIN 250 MG PO TABS: 250.0000 mg | ORAL_TABLET | Freq: Every day | ORAL | 0 refills | Status: DC

## 2018-07-26 NOTE — Progress Notes (Signed)
Chief Complaint  Patient presents with  . Back Pain    Thinks she may have a UTI    HPI  Patient presents today for low back pain onset 1 week ago.  For for 5 days involves the left side.  She describes it as a cramp and a knot.  This resolved a couple of days ago but yesterday she noted similar symptoms on the right.  Pain is moderate 6-7/10.  Of note is that the patient's husband had a heart attack yesterday and the pain was bad enough to make her decided come on and today.  He is in the hospital and she is going there after she is here.  He is also had to have her dog put down recently and 2 of her cats have been sick as well.  PMH: Smoking status noted ROS: Per HPI  Objective: BP 128/70   Pulse 85   Temp (!) 96.6 F (35.9 C) (Oral)   Ht 5\' 4"  (1.626 m)   Wt 211 lb (95.7 kg)   BMI 36.22 kg/m  Gen: NAD, alert, cooperative with exam HEENT: NCAT, EOMI, PERRL CV: RRR, good S1/S2, no murmur Resp: CTABL, no wheezes, non-labored Abd: SNTND, BS present, no guarding or organomegaly Ext: No edema, warm.  There is some tenderness noted in the lumbar paraspinous musculature.  This is present bilaterally.  Some tightness noted as well. Neuro: Alert and oriented, No gross deficits  Assessment and plan:  1. Acute low back pain, unspecified back pain laterality, unspecified whether sciatica present   2. Mechanical low back pain   3. Cystitis     Meds ordered this encounter  Medications  . nabumetone (RELAFEN) 500 MG tablet    Sig: Take 1 tablet (500 mg total) by mouth 2 (two) times daily. For muscle and joint pain    Dispense:  60 tablet    Refill:  1  . cyclobenzaprine (FLEXERIL) 10 MG tablet    Sig: Take 1 tablet (10 mg total) by mouth 3 (three) times daily as needed for muscle spasms.    Dispense:  90 tablet    Refill:  1  . levofloxacin (LEVAQUIN) 250 MG tablet    Sig: Take 1 tablet (250 mg total) by mouth daily.    Dispense:  5 tablet    Refill:  0    Orders Placed This  Encounter  Procedures  . Urine Culture  . Urinalysis, Complete    Follow up as needed.  Claretta Fraise, MD

## 2018-07-26 NOTE — Patient Instructions (Signed)

## 2018-07-27 LAB — URINE CULTURE

## 2018-07-29 DIAGNOSIS — D485 Neoplasm of uncertain behavior of skin: Secondary | ICD-10-CM | POA: Diagnosis not present

## 2018-07-29 DIAGNOSIS — L570 Actinic keratosis: Secondary | ICD-10-CM | POA: Diagnosis not present

## 2018-07-29 DIAGNOSIS — L280 Lichen simplex chronicus: Secondary | ICD-10-CM | POA: Diagnosis not present

## 2018-07-29 DIAGNOSIS — L281 Prurigo nodularis: Secondary | ICD-10-CM | POA: Diagnosis not present

## 2018-07-31 ENCOUNTER — Telehealth: Payer: Self-pay | Admitting: *Deleted

## 2018-07-31 NOTE — Telephone Encounter (Signed)
Contacted patient at Dr. Irene Limbo request to inform patient of Ferritin level of 43 and tell her he recommends on dose of IV injectafer. Patient given information. Patient states husband had MI last week w/2 stents placed. Husband now out of work and she is on disability, so she needs time to consider if she can afford treatment. States she will call office if she decides to have injectafer.  Asked permission to share her contact information and recommended treatment with [facility] to see if assistance available. Patient gave verbal permission.

## 2018-08-01 ENCOUNTER — Telehealth: Payer: Self-pay | Admitting: Family Medicine

## 2018-08-02 ENCOUNTER — Ambulatory Visit: Payer: Medicare HMO | Admitting: Family Medicine

## 2018-08-02 NOTE — Telephone Encounter (Signed)
Result was made visible to patient via mychart by Dr. Livia Snellen

## 2018-08-05 ENCOUNTER — Ambulatory Visit: Payer: Medicare HMO | Admitting: Family Medicine

## 2018-08-05 ENCOUNTER — Telehealth: Payer: Self-pay | Admitting: *Deleted

## 2018-08-05 NOTE — Telephone Encounter (Signed)
Called patient per Dr. Irene Limbo instruction: Inform patient that if unable to have IV iron, due to cost, she can take OTC Iron 325 mg once daily with food. Left VM for patient with this information. Instructed patient to call back for questions or concerns.

## 2018-08-20 DIAGNOSIS — L570 Actinic keratosis: Secondary | ICD-10-CM | POA: Diagnosis not present

## 2018-08-20 DIAGNOSIS — L280 Lichen simplex chronicus: Secondary | ICD-10-CM | POA: Diagnosis not present

## 2018-08-26 ENCOUNTER — Telehealth: Payer: Self-pay | Admitting: *Deleted

## 2018-08-26 NOTE — Telephone Encounter (Signed)
-----   Message from Kimberly Blanks, MD sent at 08/26/2018  4:16 PM EST ----- Can we let her know that her monitor is ok, sinus rhythm.  Gerald Stabs

## 2018-08-26 NOTE — Telephone Encounter (Signed)
I placed call to pt and left message to call office.  

## 2018-08-30 NOTE — Telephone Encounter (Signed)
I reviewed monitor results with pt's daughter.  She reports pt would like to cancel March follow up since testing was OK.  Will cancel this appointment and plan for 6 month follow up.

## 2018-10-08 ENCOUNTER — Ambulatory Visit (INDEPENDENT_AMBULATORY_CARE_PROVIDER_SITE_OTHER): Payer: Medicare HMO | Admitting: Family Medicine

## 2018-10-08 ENCOUNTER — Encounter: Payer: Self-pay | Admitting: Family Medicine

## 2018-10-08 VITALS — BP 116/67 | HR 95 | Temp 97.8°F | Ht 64.0 in | Wt 210.0 lb

## 2018-10-08 DIAGNOSIS — J441 Chronic obstructive pulmonary disease with (acute) exacerbation: Secondary | ICD-10-CM

## 2018-10-08 DIAGNOSIS — E785 Hyperlipidemia, unspecified: Secondary | ICD-10-CM

## 2018-10-08 DIAGNOSIS — K7460 Unspecified cirrhosis of liver: Secondary | ICD-10-CM | POA: Diagnosis not present

## 2018-10-08 DIAGNOSIS — R748 Abnormal levels of other serum enzymes: Secondary | ICD-10-CM

## 2018-10-08 DIAGNOSIS — F411 Generalized anxiety disorder: Secondary | ICD-10-CM | POA: Diagnosis not present

## 2018-10-08 DIAGNOSIS — J449 Chronic obstructive pulmonary disease, unspecified: Secondary | ICD-10-CM | POA: Diagnosis not present

## 2018-10-08 DIAGNOSIS — D500 Iron deficiency anemia secondary to blood loss (chronic): Secondary | ICD-10-CM | POA: Diagnosis not present

## 2018-10-08 DIAGNOSIS — E559 Vitamin D deficiency, unspecified: Secondary | ICD-10-CM

## 2018-10-08 MED ORDER — MILNACIPRAN HCL 12.5 & 25 & 50 MG PO MISC: ORAL | 0 refills | Status: DC

## 2018-10-08 MED ORDER — AMOXICILLIN-POT CLAVULANATE 875-125 MG PO TABS: 1.0000 | ORAL_TABLET | Freq: Two times a day (BID) | ORAL | 0 refills | Status: DC

## 2018-10-08 NOTE — Progress Notes (Signed)
Subjective:  Patient ID: Kimberly Obrien, female    DOB: 01/04/1961  Age: 58 y.o. MRN: [id number]  CC: Medical Management of Chronic Issues (six month recheck) and head and chest congestion with cough   HPI Kimberly Obrien presents for Patient presents with upper respiratory congestion. There is moderate sore throat. Patient reports coughing frequently as well.  Purulent sputum noted. There is no fever, chills or sweats. The patient reporrts dyspnea slightly more than baseline. Onset was 7-8 days ago. Gradually worsening.  Patient is also here today for follow-up on her chronic fibromyalgia.  Kimberly Obrien was evaluated by rheumatology last year and told to come back here for management for her for the diagnosis.  Kimberly Obrien tells me that Kimberly Obrien has had aching Kimberly Obrien has aching in her knees and shoulders and fingers and other joints as well.  Kimberly Obrien could not tolerate the Lyrica because it made her drool constantly.  Kimberly Obrien also could not tolerate the nausea from that medicine.  Kimberly Obrien has tried multiple NSAIDs but can no longer take them because of a GI bleed.  Today Kimberly Obrien is in for next phase of treatment of the fibromyalgia.  Kimberly Obrien has never tried [place] before.  Depression screen [facility] 2/9 10/08/2018 07/26/2018 07/02/2018  Decreased Interest 1 1 0  Down, Depressed, Hopeless 0 1 0  PHQ - 2 Score 1 2 0  Altered sleeping - 1 -  Tired, decreased energy - 1 -  Change in appetite - 0 -  Feeling bad or failure about yourself  - 0 -  Trouble concentrating - 1 -  Moving slowly or fidgety/restless - 0 -  Suicidal thoughts - 0 -  PHQ-9 Score - 5 -  Some recent data might be hidden    History Kimberly Obrien has a past medical history of Anticoagulation monitoring by pharmacist, Arthritis, Asthma, Autoimmune hepatitis ([place]), Cataract, COPD (chronic obstructive pulmonary disease) ([place]), Dyspnea, Hyperlipidemia, PE (pulmonary embolism), and Sleep apnea.   Kimberly Obrien has a past surgical history that includes Tubal ligation; Wrist surgery  (Right); Cholecystectomy; Spine surgery; Abdominal hysterectomy; Eye surgery; left and right heart catheterization with coronary angiogram (N/A, 05/13/2014); Incontinence surgery (2009); Cataract extraction, bilateral (Bilateral); Esophagogastroduodenoscopy (egd) with propofol (N/A, 05/07/2017); Colonoscopy with propofol (N/A, 05/07/2017); IR Venogram Hepatic W Hemodynamic Evaluation (03/15/2018); and IR Transcatheter BX (03/15/2018).   Her family history includes Bipolar disorder in her father; Cirrhosis in her mother; Heart failure in her father; Hyperlipidemia in her father; Hypertension in her brother; Obesity in her daughter.Kimberly Obrien reports that Kimberly Obrien quit smoking about 6 years ago. Her smoking use included cigarettes. Kimberly Obrien started smoking about 41 years ago. Kimberly Obrien has a 100.00 pack-year smoking history. Kimberly Obrien has never used smokeless tobacco. Kimberly Obrien reports that Kimberly Obrien does not drink alcohol or use drugs.    ROS Review of Systems  Constitutional: Negative.  Negative for activity change, appetite change, chills and fever.  HENT: Positive for congestion, rhinorrhea and sinus pressure. Negative for ear discharge, ear pain, hearing loss, nosebleeds, postnasal drip, sneezing and trouble swallowing.   Eyes: Negative for visual disturbance.  Respiratory: Positive for cough, shortness of breath and wheezing. Negative for chest tightness.   Cardiovascular: Negative for chest pain and palpitations.  Gastrointestinal: Negative for abdominal pain, constipation, diarrhea, nausea and vomiting.  Genitourinary: Negative for difficulty urinating.  Musculoskeletal: Positive for arthralgias and myalgias.  Skin: Negative for rash.  Neurological: Negative for headaches.  Psychiatric/Behavioral: Negative for sleep disturbance.    Objective:  BP 116/67   Pulse 95  Temp 97.8 F (36.6 C) (Oral)   Ht '5\' 4"'  (1.626 m)   Wt 210 lb (95.3 kg)   SpO2 94%   BMI 36.05 kg/m   BP Readings from Last 3 Encounters:  10/08/18 116/67    07/26/18 128/70  07/22/18 (!) 157/65    Wt Readings from Last 3 Encounters:  10/08/18 210 lb (95.3 kg)  07/26/18 211 lb (95.7 kg)  07/22/18 212 lb 12.8 oz (96.5 kg)     Physical Exam Constitutional:      General: Kimberly Obrien is not in acute distress.    Appearance: Kimberly Obrien is well-developed.  HENT:     Head: Normocephalic and atraumatic.     Right Ear: External ear normal.     Left Ear: External ear normal.     Nose: Nose normal.  Eyes:     Conjunctiva/sclera: Conjunctivae normal.     Pupils: Pupils are equal, round, and reactive to light.  Neck:     Musculoskeletal: Normal range of motion and neck supple.     Thyroid: No thyromegaly.  Cardiovascular:     Rate and Rhythm: Normal rate and regular rhythm.     Heart sounds: Normal heart sounds. No murmur.  Pulmonary:     Effort: Pulmonary effort is normal. No respiratory distress.     Breath sounds: Wheezing and rhonchi (Scattered) present. No rales.  Abdominal:     General: Bowel sounds are normal. There is no distension.     Palpations: Abdomen is soft.     Tenderness: There is no abdominal tenderness.  Musculoskeletal:        General: Swelling (At the PIP regions of the fingers.) and tenderness (At the shoulders, PIPs of the fingers) present. No deformity.  Lymphadenopathy:     Cervical: No cervical adenopathy.  Skin:    General: Skin is warm and dry.  Neurological:     Mental Status: Kimberly Obrien is alert and oriented to person, place, and time.     Deep Tendon Reflexes: Reflexes are normal and symmetric.  Psychiatric:        Behavior: Behavior normal.        Thought Content: Thought content normal.        Judgment: Judgment normal.       Assessment & Plan:   Kimberly Obrien was seen today for medical management of chronic issues and head and chest congestion with cough.  Diagnoses and all orders for this visit:  Chronic obstructive pulmonary disease with acute exacerbation (HCC) -     CBC with Differential/Platelet -      CMP14+EGFR -     Lipid panel -     amoxicillin-clavulanate (AUGMENTIN) 875-125 MG tablet; Take 1 tablet by mouth 2 (two) times daily. Take all of this medication  Elevated liver enzymes -     CBC with Differential/Platelet -     CMP14+EGFR -     Lipid panel  Iron deficiency anemia due to chronic blood loss -     CBC with Differential/Platelet -     CMP14+EGFR -     Lipid panel  Hepatic cirrhosis, unspecified hepatic cirrhosis type, unspecified whether ascites present (HCC) -     CBC with Differential/Platelet -     CMP14+EGFR -     Lipid panel  GAD (generalized anxiety disorder) -     CBC with Differential/Platelet -     CMP14+EGFR -     Lipid panel  Hyperlipidemia, unspecified hyperlipidemia type -     CBC with Differential/Platelet -  CMP14+EGFR -     Lipid panel  Vitamin D deficiency -     VITAMIN D 25 Hydroxy (Vit-D Deficiency, Fractures)  Other orders -     Milnacipran HCl 12.5 & 25 & 50 MG MISC; Use as directed       I have discontinued Mardene Celeste B. Holan's pregabalin, nabumetone, cyclobenzaprine, and levofloxacin. I am also having her start on amoxicillin-clavulanate and Milnacipran HCl. Additionally, I am having her maintain her albuterol, fluticasone, and budesonide-formoterol.  Allergies as of 10/08/2018      Reactions   Aciphex [rabeprazole Sodium] Rash   Fluconazole Rash, Hives   Lansoprazole Hives   Sucralfate Nausea Only   Vancomycin Diarrhea   Claritin [loratadine] Other (See Comments)   Tired   Crestor [rosuvastatin] Other (See Comments)   Elevated blood glucose and abdominal pain   Doxycycline Rash   Ibuprofen Other (See Comments)   Upset stomach   Lipitor [atorvastatin] Other (See Comments)   Myalgias, leg pain.  Problems with pancreas, sugars went up also.   Macrobid [nitrofurantoin Macrocrystal] Nausea And Vomiting   Metoprolol Nausea And Vomiting   Metronidazole Other (See Comments), Nausea Only   Dizziness, nausea, dry mouth and  some shortness of breath   Nsaids Other (See Comments)   Upset stomach   Omeprazole    bloating   Rabeprazole Rash   Septra [sulfamethoxazole-trimethoprim] Nausea And Vomiting      Medication List       Accurate as of October 08, 2018 11:57 AM. Always use your most recent med list.        albuterol 108 (90 Base) MCG/ACT inhaler Commonly known as:  PROVENTIL HFA;VENTOLIN HFA Inhale into the lungs every 6 (six) hours as needed for wheezing or shortness of breath.   amoxicillin-clavulanate 875-125 MG tablet Commonly known as:  AUGMENTIN Take 1 tablet by mouth 2 (two) times daily. Take all of this medication   budesonide-formoterol 80-4.5 MCG/ACT inhaler Commonly known as:  SYMBICORT Inhale 2 puffs into the lungs 2 (two) times daily.   fluticasone 50 MCG/ACT nasal spray Commonly known as:  FLONASE USE TWO SPRAY(S) IN EACH NOSTRIL ONCE DAILY   Milnacipran HCl 12.5 & 25 & 50 MG Misc Use as directed        Follow-up: No follow-ups on file.  Claretta Fraise, M.D.

## 2018-10-08 NOTE — Patient Instructions (Signed)
50 pound weight loss

## 2018-10-09 LAB — CMP14+EGFR
ALT: 39 IU/L — ABNORMAL HIGH (ref 0–32)
AST: 55 IU/L — ABNORMAL HIGH (ref 0–40)
Albumin/Globulin Ratio: 1.7 (ref 1.2–2.2)
Albumin: 3.7 g/dL — ABNORMAL LOW (ref 3.8–4.9)
Alkaline Phosphatase: 131 IU/L — ABNORMAL HIGH (ref 39–117)
BUN/Creatinine Ratio: 10 (ref 9–23)
BUN: 8 mg/dL (ref 6–24)
Bilirubin Total: 2 mg/dL — ABNORMAL HIGH (ref 0.0–1.2)
CO2: 23 mmol/L (ref 20–29)
Calcium: 8.9 mg/dL (ref 8.7–10.2)
Chloride: 105 mmol/L (ref 96–106)
Creatinine, Ser: 0.77 mg/dL (ref 0.57–1.00)
GFR calc Af Amer: 98 mL/min/{1.73_m2} (ref >59)
GFR calc non Af Amer: 85 mL/min/{1.73_m2} (ref >59)
Globulin, Total: 2.2 g/dL (ref 1.5–4.5)
Glucose: 163 mg/dL — ABNORMAL HIGH (ref 65–99)
Potassium: 3.6 mmol/L (ref 3.5–5.2)
Sodium: 143 mmol/L (ref 134–144)
Total Protein: 5.9 g/dL — ABNORMAL LOW (ref 6.0–8.5)

## 2018-10-09 LAB — CBC WITH DIFFERENTIAL/PLATELET
Basophils Absolute: 0 10*3/uL (ref 0.0–0.2)
Basos: 1 %
EOS (ABSOLUTE): 0.1 10*3/uL (ref 0.0–0.4)
Eos: 1 %
Hematocrit: 39.3 % (ref 34.0–46.6)
Hemoglobin: 13.2 g/dL (ref 11.1–15.9)
Immature Grans (Abs): 0 10*3/uL (ref 0.0–0.1)
Immature Granulocytes: 0 %
Lymphocytes Absolute: 1.1 10*3/uL (ref 0.7–3.1)
Lymphs: 17 %
MCH: 28.6 pg (ref 26.6–33.0)
MCHC: 33.6 g/dL (ref 31.5–35.7)
MCV: 85 fL (ref 79–97)
Monocytes Absolute: 0.3 10*3/uL (ref 0.1–0.9)
Monocytes: 5 %
Neutrophils Absolute: 4.7 10*3/uL (ref 1.4–7.0)
Neutrophils: 76 %
Platelets: 114 10*3/uL — ABNORMAL LOW (ref 150–450)
RBC: 4.62 x10E6/uL (ref 3.77–5.28)
RDW: 13.2 % (ref 11.7–15.4)
WBC: 6.3 10*3/uL (ref 3.4–10.8)

## 2018-10-09 LAB — LIPID PANEL
Chol/HDL Ratio: 2.8 ratio (ref 0.0–4.4)
Cholesterol, Total: 150 mg/dL (ref 100–199)
HDL: 54 mg/dL (ref >39)
LDL Calculated: 67 mg/dL (ref 0–99)
Triglycerides: 146 mg/dL (ref 0–149)
VLDL Cholesterol Cal: 29 mg/dL (ref 5–40)

## 2018-10-09 LAB — VITAMIN D 25 HYDROXY (VIT D DEFICIENCY, FRACTURES): Vit D, 25-Hydroxy: 18.7 ng/mL — ABNORMAL LOW (ref 30.0–100.0)

## 2018-10-09 MED ORDER — VITAMIN D (ERGOCALCIFEROL) 1.25 MG (50000 UNIT) PO CAPS: 50000.0000 [IU] | ORAL_CAPSULE | ORAL | 1 refills | Status: DC

## 2018-10-09 NOTE — Addendum Note (Signed)
Addended by: Shelbie Ammons on: 10/09/2018 09:19 AM   Modules accepted: Orders

## 2018-11-14 ENCOUNTER — Ambulatory Visit: Payer: Medicare HMO | Admitting: Cardiovascular Disease

## 2019-01-02 ENCOUNTER — Other Ambulatory Visit: Payer: Self-pay | Admitting: Family Medicine

## 2019-01-23 NOTE — Progress Notes (Signed)
HEMATOLOGY CLINIC NOTE Patient Care Team: Claretta Fraise, MD as PCP - General (Family Medicine) [name], MD as PCP - Cardiology (Cardiology) Sinda Du, MD as Consulting Physician (Pulmonary Disease) Teena Irani, MD (Inactive) as Consulting Physician (Gastroenterology) [name], MD as Consulting Physician (Cardiology)    CHIEF COMPLAINTS/PURPOSE OF CONSULTATION:  F/u for IDA  HISTORY OF PRESENTING ILLNESS:   Kimberly Obrien 58 y.o. female is here because of a referral from Dr. 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   The pt reports that she is doing well overall. She reports seeing a Hematologist first after having a PE on her right side, damaging the lower lobe in 1999. She reports having a second in 2003 with 3 small PE in her right upper lobe. She reports having been on estrogen containing birth control in 1999 which she was told likely led to her PE, and was smoking at the time. She reports taking a diet pill with hormones in 2003 when she had her second PE. She denies having genetic testing regarding these Pes. She reports taking Warfarin 6 months after the first PE. She reports having taken Coumadin from 2003 until 3 months ago when she stopped due to her GI bleeding.  She denies having had any other blood clots, through 4 pregnancies and a few surgeries.  She denies a FHx of blood clotting or bleeding problems. She reports being SOB when she is losing lots of blood. She reports having a blood transfusion on 05/07/17, and receiving two iron infusions in the last 4 months. She denies allergic reactions to IV iron, but does note stomach issues and drowsiness for a couple days following IV iron.   Dr. Oletta Lamas has been following her iron levels. She reports using an inhaler.  She reports that Dr. 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   Of note prior to the patient's visit, pt has had an Abdomen CT completed on 04/19/17 with results revealing Mild splenomegaly.  On 05/07/17 she had a colonoscopy with 4 polyps removed, and non-bleeding internal hemorrhoids and upper endoscopy (05/07/17) with Gastritis. Biopsied. Normal examined duodenum. Blood found in stool.  Most recent lab results (08/30/17) of CBC, CMP is as follows: all values are WNL except for Glucose at 128, Total bilirubin at 1.4, AST at 68, ALT at 64, MCH at 26.4, Plt at 136k, Neut % at 75.5%.   On review of systems, pt reports occasional SOB, blood in the stools, abdominal pains, and denies mouth ulcers, and any other symptoms.   On PMHx the pt reports IBS, Positive hemoccult card, RRR with murmur, abdominal pain, respiratory disease, migraines, vertigo, COPD, arthritis, DJD, abnormal heart rhythm, fatty liver, pulmonary embolism, pulmonary edema, tachycardia, Vit D deficiency, arthralgia, insomnia, fatigue, hyperlipidemia, anxiety, dysuria, and sleep apnea.  On Surgical Hx the pt reports back surgery x4 (1997, 2006, 2006, 2007), tubal ligation (1985), wrist surgery x2 (1995 x2), cholecystectomy (2001), colonoscopy x2 (2007, 2014), EGD x2 (2002, 2017), hysterectomy partial (2009), rt eye cataract (2013), and cardiac catheterization (2015). On Social Hx the pt reports quitting smoking in 04/18/12 after 30 years, no ETOH use, and being occupationally disabled.    INTERVAL HISTORY   Kimberly Obrien is here for management and evaluation of her iron deficiency anemia. The patient's last visit with [place] was on 07/22/18. The pt reports that she is doing well  overall.  The pt reports that she has had intermittent blood in the urine, which she endorses because her urine is orange. She does have autoimmune hepatitis, and her last bilirubin levels were slightly elevated. She continues seeing Dr. Oletta Lamas for management of her liver  concerns. She notes that she has seen GI regarding her blood loss recently but intends to return to care soon.  She notes that when she has a bowel movement, she sees a "red tint" in the toilet and sees blood on the toilet paper when she wipes.  The pt notes that she was diagnosed with Fibro myalgia and has continued to have body aches which she relates to inflammation. She has avoided NSAIDs but notes that these have been helpful for her pain. She notes that her joints have been painful throughout the day.  The pt denies taking PO Iron in the interim because it bothers her stomach too much. She notes that her lower energy levels only improve for 2-3 days after her IV Iron infusions.  The pt notes that she would be limited by SOB, endorsing mild COPD, from walking as long as she would like to. She stopped smoking in 2013.  Lab results today (01/24/19) of CBC w/diff is as follows: all values are WNL except for PLT at 92k. 01/24/19 CMP and Ferritin are pending  On review of systems, pt reports low energy levels, blood on toilet paper, joint aches, intermittent orange urine, and denies concerns for infections, and any other symptoms.   MEDICAL HISTORY:  Past Medical History:  Diagnosis Date   Anticoagulation monitoring by pharmacist    Arthritis    Asthma    Autoimmune hepatitis ([place])    Cataract    COPD (chronic obstructive pulmonary disease) ([place])    Dyspnea    exertion   Hyperlipidemia    PE (pulmonary embolism)    Sleep apnea     SURGICAL HISTORY: Past Surgical History:  Procedure Laterality Date   ABDOMINAL HYSTERECTOMY     CATARACT EXTRACTION, BILATERAL Bilateral    CHOLECYSTECTOMY     COLONOSCOPY WITH PROPOFOL N/A 05/07/2017   Procedure: COLONOSCOPY WITH PROPOFOL;  Surgeon: Laurence Spates, MD;  Location: [facility];  Service: Endoscopy;  Laterality: N/A;   ESOPHAGOGASTRODUODENOSCOPY (EGD) WITH PROPOFOL N/A 05/07/2017   Procedure: ESOPHAGOGASTRODUODENOSCOPY  (EGD) WITH PROPOFOL;  Surgeon: Laurence Spates, MD;  Location: [facility];  Service: Endoscopy;  Laterality: N/A;   EYE SURGERY     Cataracts   INCONTINENCE SURGERY  2009   IR TRANSCATHETER BX  03/15/2018   IR VENOGRAM HEPATIC W HEMODYNAMIC EVALUATION  03/15/2018   LEFT AND RIGHT HEART CATHETERIZATION WITH CORONARY ANGIOGRAM N/A 05/13/2014   Procedure: LEFT AND RIGHT HEART CATHETERIZATION WITH CORONARY ANGIOGRAM;  Surgeon: [name], MD;  Location: [facility] CATH LAB;  Service: Cardiovascular;  Laterality: N/A;   SPINE SURGERY     L5 S1 fusion   TUBAL LIGATION     WRIST SURGERY Right    Had surgery twice to shave bone for blood circulation improvement.    SOCIAL HISTORY: Social History   Socioeconomic History   Marital status: Married    Spouse name: Not on file   Number of children: 4   Years of education: GED   Highest education level: GED or equivalent  Occupational History   Occupation: Unemployed  Scientist, product/process development strain: Not hard at all   Food insecurity:    Worry: Never true    Inability: Never true  Transportation needs:    Medical: No    Non-medical: No  Tobacco Use   Smoking status: Former Smoker    Packs/day: 2.00    Years: 50.00    Pack years: 100.00    Types: Cigarettes    Start date: 08/22/1977    Last attempt to quit: 04/21/2012    Years since quitting: 6.7   Smokeless tobacco: Never Used  Substance and Sexual Activity   Alcohol use: No   Drug use: No   Sexual activity: Yes  Lifestyle   Physical activity:    Days per week: 0 days    Minutes per session: 0 min   Stress: To some extent  Relationships   Social connections:    Talks on phone: More than three times a week    Gets together: More than three times a week    Attends religious service: More than 4 times per year    Active member of club or organization: Yes    Attends meetings of clubs or organizations: More than 4 times per year     Relationship status: Married   Intimate partner violence:    Fear of current or ex partner: No    Emotionally abused: No    Physically abused: No    Forced sexual activity: No  Other Topics Concern   Not on file  Social History Narrative   Not on file    FAMILY HISTORY: Family History  Problem Relation Age of Onset   Cirrhosis Mother    Hyperlipidemia Father    Heart failure Father    Bipolar disorder Father    Obesity Daughter    Hypertension Brother    Heart attack Neg Hx    Miscarriages / Stillbirths Neg Hx     ALLERGIES:  is allergic to aciphex [rabeprazole sodium]; fluconazole; lansoprazole; sucralfate; vancomycin; claritin [loratadine]; crestor [rosuvastatin]; doxycycline; ibuprofen; lipitor [atorvastatin]; macrobid [nitrofurantoin macrocrystal]; metoprolol; metronidazole; nsaids; omeprazole; rabeprazole; and septra [sulfamethoxazole-trimethoprim].  MEDICATIONS:  Current Outpatient Medications  Medication Sig Dispense Refill   albuterol (PROVENTIL HFA;VENTOLIN HFA) 108 (90 BASE) MCG/ACT inhaler Inhale into the lungs every 6 (six) hours as needed for wheezing or shortness of breath.     amoxicillin-clavulanate (AUGMENTIN) 875-125 MG tablet Take 1 tablet by mouth 2 (two) times daily. Take all of this medication 20 tablet 0   budesonide-formoterol (SYMBICORT) 80-4.5 MCG/ACT inhaler INHALE TWO PUFFS BY MOUTH ONCE DAILY 11 g 0   fluticasone (FLONASE) 50 MCG/ACT nasal spray USE TWO SPRAY(S) IN EACH NOSTRIL ONCE DAILY 16 g 5   Milnacipran HCl 12.5 & 25 & 50 MG MISC Use as directed 1 each 0   Vitamin D, Ergocalciferol, (DRISDOL) 1.25 MG (50000 UT) CAPS capsule Take 1 capsule (50,000 Units total) by mouth every 7 (seven) days. 13 capsule 1   No current facility-administered medications for this visit.     REVIEW OF SYSTEMS:   A 10+ POINT REVIEW OF SYSTEMS WAS OBTAINED including neurology, dermatology, psychiatry, cardiac, respiratory, lymph, extremities, GI,  GU, Musculoskeletal, constitutional, breasts, reproductive, HEENT.  All pertinent positives are noted in the HPI.  All others are negative.   PHYSICAL EXAMINATION:  Vitals:   01/24/19 1009  BP: (!) 113/59  Pulse: 96  Resp: 18  Temp: 98.2 F (36.8 C)  SpO2: 95%   Filed Weights   01/24/19 1009  Weight: 216 lb 6.4 oz (98.2 kg)    GENERAL:alert, in no acute distress and comfortable SKIN: no acute rashes, no significant lesions EYES: conjunctiva  are pink and non-injected, sclera anicteric OROPHARYNX: MMM, no exudates, no oropharyngeal erythema or ulceration NECK: supple, no JVD LYMPH:  no palpable lymphadenopathy in the cervical, axillary or inguinal regions LUNGS: clear to auscultation b/l with normal respiratory effort HEART: regular rate & rhythm ABDOMEN:  normoactive bowel sounds , non tender, not distended. No palpable hepatosplenomegaly.  Extremity: no pedal edema PSYCH: alert & oriented x 3 with fluent speech NEURO: no focal motor/sensory deficits   LABORATORY DATA:  I have reviewed the data as listed Recent Results (from the past 2160 hour(s))  CBC with Differential/Platelet     Status: Abnormal   Collection Time: 01/24/19  9:46 AM  Result Value Ref Range   WBC 4.7 4.0 - 10.5 K/uL   RBC 4.46 3.87 - 5.11 MIL/uL   Hemoglobin 12.1 12.0 - 15.0 g/dL   HCT 37.9 36.0 - 46.0 %   MCV 85.0 80.0 - 100.0 fL   MCH 27.1 26.0 - 34.0 pg   MCHC 31.9 30.0 - 36.0 g/dL   RDW 14.1 11.5 - 15.5 %   Platelets 92 (L) 150 - 400 K/uL   nRBC 0.0 0.0 - 0.2 %   Neutrophils Relative % 70 %   Neutro Abs 3.3 1.7 - 7.7 K/uL   Lymphocytes Relative 23 %   Lymphs Abs 1.1 0.7 - 4.0 K/uL   Monocytes Relative 6 %   Monocytes Absolute 0.3 0.1 - 1.0 K/uL   Eosinophils Relative 1 %   Eosinophils Absolute 0.1 0.0 - 0.5 K/uL   Basophils Relative 0 %   Basophils Absolute 0.0 0.0 - 0.1 K/uL   Immature Granulocytes 0 %   Abs Immature Granulocytes 0.01 0.00 - 0.07 K/uL    Comment: Performed at [facility], [address] [place], [place] [address]  Iron and TIBC     Status: Abnormal   Collection Time: 01/24/19  9:46 AM  Result Value Ref Range   Iron 75 41 - 142 ug/dL   TIBC 439 236 - 444 ug/dL   Saturation Ratios 17 (L) 21 - 57 %   UIBC 364 120 - 384 ug/dL    Comment: Performed at [facility], [place] [address], [place], [place] [address]  CMP ([place] only)     Status: Abnormal   Collection Time: 01/24/19  9:46 AM  Result Value Ref Range   Sodium 142 135 - 145 mmol/L   Potassium 3.5 3.5 - 5.1 mmol/L   Chloride 110 98 - 111 mmol/L   CO2 24 22 - 32 mmol/L   Glucose, Bld 142 (H) 70 - 99 mg/dL   BUN 7 6 - 20 mg/dL   Creatinine 0.67 0.44 - 1.00 mg/dL   Calcium 8.1 (L) 8.9 - 10.3 mg/dL   Total Protein 6.0 (L) 6.5 - 8.1 g/dL   Albumin 3.3 (L) 3.5 - 5.0 g/dL   AST 51 (H) 15 - 41 U/L   ALT 36 0 - 44 U/L   Alkaline Phosphatase 117 38 - 126 U/L   Total Bilirubin 2.5 (H) 0.3 - 1.2 mg/dL   GFR, Est Non Af Am >60 >60 mL/min   GFR, Est AFR Am >60 >60 mL/min   Anion gap 8 5 - 15    Comment: Performed at [facility], [place] [address], [place], [place] [address]  Ferritin     Status: None   Collection Time: 01/24/19  9:46 AM  Result Value Ref Range  Ferritin 17 11 - 307 ng/mL    Comment: Performed at [facility], [place] [address], [place], [place] [address]   . Lab Results  Component Value Date   IRON 75 01/24/2019   TIBC 439 01/24/2019   IRONPCTSAT 17 (L) 01/24/2019   (Iron and TIBC)  Lab Results  Component Value Date   FERRITIN 17 01/24/2019   . CBC Latest Ref Rng & Units 01/24/2019 10/08/2018 07/22/2018  WBC 4.0 - 10.5 K/uL 4.7 6.3 4.2  Hemoglobin 12.0 - 15.0 g/dL 12.1 13.2 13.2  Hematocrit 36.0 - 46.0 % 37.9 39.3 39.5  Platelets 150 - 400 K/uL 92(L) 114(L) 93(L)   . CMP Latest Ref Rng & Units 01/24/2019 10/08/2018 07/22/2018  Glucose 70 - 99 mg/dL 142(H) 163(H) 203(H)    BUN 6 - 20 mg/dL 7 8 7   Creatinine 0.44 - 1.00 mg/dL 0.67 0.77 0.71  Sodium 135 - 145 mmol/L 142 143 143  Potassium 3.5 - 5.1 mmol/L 3.5 3.6 3.3(L)  Chloride 98 - 111 mmol/L 110 105 110  CO2 22 - 32 mmol/L 24 23 25   Calcium 8.9 - 10.3 mg/dL 8.1(L) 8.9 8.5(L)  Total Protein 6.5 - 8.1 g/dL 6.0(L) 5.9(L) 5.9(L)  Total Bilirubin 0.3 - 1.2 mg/dL 2.5(H) 2.0(H) 1.7(H)  Alkaline Phos 38 - 126 U/L 117 131(H) 109  AST 15 - 41 U/L 51(H) 55(H) 45(H)  ALT 0 - 44 U/L 36 39(H) 29      Diagnosis 05/07/17 1. Stomach, biopsy, antrum - REACTIVE GASTROPATHY. - THERE IS NO EVIDENCE OF HELICOBACTER PYLORI, DYSPLASIA OR MALIGNANCY. - SEE COMMENT. 2. Colon, polyp(s), cecal x 2 - TUBULAR ADENOMA(S). - HIGH GRADE DYSPLASIA IS NOT IDENTIFIED. 3. Colon, polyp(s), descending x 2 - TUBULAR ADENOMA(S). - HIGH GRADE DYSPLASIA IS NOT IDENTIFIED. Microscopic Comment 1. This pattern can be associated with non-steroidal anti-inflammatory drugs (NSAIDS), alcohol or with other causes of chemical gastropathy. Helicobacter pylori are not identified with Warthin-Starry stain.   ASSESSMENT & PLAN:   58 y.o. female with   1. History of recurrent Pulmonary Embolisms Her previous events appears to have been triggered by taking estrogen containing hormones and smoking. No overt Fhx of thrombophilias and no previous VTE with several pregnancies and surgeries - suggest against significant hereditary thrombophilia. Factor V Leiden was negative, Prothrombin gene mutation neg, APLA Ab panel - unrevealing.  PLAN:  -patient continues to have issues with GI bleeding and is off blood thinners as a result -no clear justification for restarting anticoagulation till her GI bleeding is completely control and then she could be considered for prophylactic ASA.  2. GI bleeding -recent w/u done -05/07/17 Upper Endoscopy and Colonoscopy show gastritis with 4 large colon polyps which were removed which were found to be tubular  adenomas.    3. Autoimmune hepatitis 04/08/18 US Abdomen revealed Hepatic steatosis without other significant abnormality. -continue f/u with Dr Oletta Lamas for further evaluation and mx of GI bleeding.  4. Iron deficiency Anemia related to GI bleeding. Has needed previous IV Iron and PRBC transfusions. . 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 PLT stable at 92k. -01/24/19 Ferritin is 17 -Will set pt up for IV Injectafer  as needed. Goal for Ferritin >100. -Discussed that her thrombocytopenia has been stable overall, and we would not consider potential treatment for this unless her PLT <50k -Tendency to auto-immunity given autoimmune hepatitis -Recommend discussing alternative pain management strategies with her PCP -Continue f/u with PCP and GI regarding GI bleeding isseus -Recommended that the pt continue to eat well, drink at least 48-64 oz of water each day, and walk 20-30 minutes each day. -Will see the pt back in 6 months  Will offer patient IV Injectafer weekly x 2 doses RTC with Dr Irene Limbo with labs in 6 months   All questions were answered. The patient knows to call the clinic with any problems, questions or concerns.  The total time spent in the appt was 25 minutes and more than 50% was on counseling and direct patient cares.   Sullivan Lone MD [place] AAHIVMS [facility] Hematology/Oncology Physician [facility]  (Office):       [phone] (Work cell):  [phone] (Fax):           [phone]  I, Baldwin [place], am acting as a scribe for Dr. Sullivan Lone.   .I have reviewed the above documentation for accuracy and completeness, and I agree with the above. Brunetta Genera MD

## 2019-01-24 ENCOUNTER — Telehealth: Payer: Self-pay | Admitting: Hematology

## 2019-01-24 ENCOUNTER — Inpatient Hospital Stay: Payer: Medicare HMO | Admitting: Hematology

## 2019-01-24 ENCOUNTER — Inpatient Hospital Stay: Payer: Medicare HMO | Attending: Hematology

## 2019-01-24 ENCOUNTER — Other Ambulatory Visit: Payer: Self-pay

## 2019-01-24 ENCOUNTER — Inpatient Hospital Stay (HOSPITAL_BASED_OUTPATIENT_CLINIC_OR_DEPARTMENT_OTHER): Payer: Medicare HMO | Admitting: Hematology

## 2019-01-24 VITALS — BP 113/59 | HR 96 | Temp 98.2°F | Resp 18 | Ht 64.0 in | Wt 216.4 lb

## 2019-01-24 DIAGNOSIS — G4730 Sleep apnea, unspecified: Secondary | ICD-10-CM

## 2019-01-24 DIAGNOSIS — D500 Iron deficiency anemia secondary to blood loss (chronic): Secondary | ICD-10-CM

## 2019-01-24 DIAGNOSIS — Z7951 Long term (current) use of inhaled steroids: Secondary | ICD-10-CM | POA: Insufficient documentation

## 2019-01-24 DIAGNOSIS — E785 Hyperlipidemia, unspecified: Secondary | ICD-10-CM

## 2019-01-24 DIAGNOSIS — J449 Chronic obstructive pulmonary disease, unspecified: Secondary | ICD-10-CM | POA: Diagnosis not present

## 2019-01-24 DIAGNOSIS — Z87891 Personal history of nicotine dependence: Secondary | ICD-10-CM | POA: Insufficient documentation

## 2019-01-24 DIAGNOSIS — Z90710 Acquired absence of both cervix and uterus: Secondary | ICD-10-CM | POA: Diagnosis not present

## 2019-01-24 DIAGNOSIS — D508 Other iron deficiency anemias: Secondary | ICD-10-CM | POA: Diagnosis not present

## 2019-01-24 DIAGNOSIS — Z79899 Other long term (current) drug therapy: Secondary | ICD-10-CM

## 2019-01-24 DIAGNOSIS — K754 Autoimmune hepatitis: Secondary | ICD-10-CM | POA: Insufficient documentation

## 2019-01-24 DIAGNOSIS — D696 Thrombocytopenia, unspecified: Secondary | ICD-10-CM

## 2019-01-24 DIAGNOSIS — Z86711 Personal history of pulmonary embolism: Secondary | ICD-10-CM | POA: Insufficient documentation

## 2019-01-24 LAB — CBC WITH DIFFERENTIAL/PLATELET
Abs Immature Granulocytes: 0.01 10*3/uL (ref 0.00–0.07)
Basophils Absolute: 0 10*3/uL (ref 0.0–0.1)
Basophils Relative: 0 %
Eosinophils Absolute: 0.1 10*3/uL (ref 0.0–0.5)
Eosinophils Relative: 1 %
HCT: 37.9 % (ref 36.0–46.0)
Hemoglobin: 12.1 g/dL (ref 12.0–15.0)
Immature Granulocytes: 0 %
Lymphocytes Relative: 23 %
Lymphs Abs: 1.1 10*3/uL (ref 0.7–4.0)
MCH: 27.1 pg (ref 26.0–34.0)
MCHC: 31.9 g/dL (ref 30.0–36.0)
MCV: 85 fL (ref 80.0–100.0)
Monocytes Absolute: 0.3 10*3/uL (ref 0.1–1.0)
Monocytes Relative: 6 %
Neutro Abs: 3.3 10*3/uL (ref 1.7–7.7)
Neutrophils Relative %: 70 %
Platelets: 92 10*3/uL — ABNORMAL LOW (ref 150–400)
RBC: 4.46 MIL/uL (ref 3.87–5.11)
RDW: 14.1 % (ref 11.5–15.5)
WBC: 4.7 10*3/uL (ref 4.0–10.5)
nRBC: 0 % (ref 0.0–0.2)

## 2019-01-24 LAB — CMP (CANCER CENTER ONLY)
ALT: 36 U/L (ref 0–44)
AST: 51 U/L — ABNORMAL HIGH (ref 15–41)
Albumin: 3.3 g/dL — ABNORMAL LOW (ref 3.5–5.0)
Alkaline Phosphatase: 117 U/L (ref 38–126)
Anion gap: 8 (ref 5–15)
BUN: 7 mg/dL (ref 6–20)
CO2: 24 mmol/L (ref 22–32)
Calcium: 8.1 mg/dL — ABNORMAL LOW (ref 8.9–10.3)
Chloride: 110 mmol/L (ref 98–111)
Creatinine: 0.67 mg/dL (ref 0.44–1.00)
GFR, Est AFR Am: >60 mL/min (ref >60)
GFR, Estimated: >60 mL/min (ref >60)
Glucose, Bld: 142 mg/dL — ABNORMAL HIGH (ref 70–99)
Potassium: 3.5 mmol/L (ref 3.5–5.1)
Sodium: 142 mmol/L (ref 135–145)
Total Bilirubin: 2.5 mg/dL — ABNORMAL HIGH (ref 0.3–1.2)
Total Protein: 6 g/dL — ABNORMAL LOW (ref 6.5–8.1)

## 2019-01-24 LAB — IRON AND TIBC
Iron: 75 ug/dL (ref 41–142)
Saturation Ratios: 17 % — ABNORMAL LOW (ref 21–57)
TIBC: 439 ug/dL (ref 236–444)
UIBC: 364 ug/dL (ref 120–384)

## 2019-01-24 LAB — FERRITIN: Ferritin: 17 ng/mL (ref 11–307)

## 2019-01-24 NOTE — Telephone Encounter (Signed)
Scheduled appt per 6/5 los. A calendar will be mailed out.

## 2019-02-03 ENCOUNTER — Telehealth: Payer: Self-pay | Admitting: *Deleted

## 2019-02-03 NOTE — Telephone Encounter (Signed)
-----   Message from Brunetta Genera, MD sent at 01/29/2019 11:48 PM EDT ----- Plz let patient know her ferritin is down to 17. Would recommend IV Iron weekly x 2 doses to maintain ferritin >100. If patient agreeable plz schedule for IV Iron -orders are in. Thanks [place]

## 2019-02-03 NOTE — Telephone Encounter (Signed)
Contacted patient regarding test results per Dr. Grier Mitts directions in attached message.  Patient is agreeable to IV iron. Schedule message sent - patient expecting call.

## 2019-02-05 ENCOUNTER — Telehealth: Payer: Self-pay | Admitting: Hematology

## 2019-02-05 NOTE — Telephone Encounter (Signed)
Spoke with patient re 6/24 and 7/1 infusion appointments.

## 2019-02-12 ENCOUNTER — Inpatient Hospital Stay: Payer: Medicare HMO

## 2019-02-12 ENCOUNTER — Other Ambulatory Visit: Payer: Self-pay

## 2019-02-12 VITALS — BP 129/52 | HR 67 | Temp 98.5°F | Resp 16

## 2019-02-12 DIAGNOSIS — Z86711 Personal history of pulmonary embolism: Secondary | ICD-10-CM | POA: Diagnosis not present

## 2019-02-12 DIAGNOSIS — D508 Other iron deficiency anemias: Secondary | ICD-10-CM | POA: Diagnosis not present

## 2019-02-12 DIAGNOSIS — E785 Hyperlipidemia, unspecified: Secondary | ICD-10-CM | POA: Diagnosis not present

## 2019-02-12 DIAGNOSIS — K754 Autoimmune hepatitis: Secondary | ICD-10-CM | POA: Diagnosis not present

## 2019-02-12 DIAGNOSIS — D696 Thrombocytopenia, unspecified: Secondary | ICD-10-CM | POA: Diagnosis not present

## 2019-02-12 DIAGNOSIS — J449 Chronic obstructive pulmonary disease, unspecified: Secondary | ICD-10-CM | POA: Diagnosis not present

## 2019-02-12 DIAGNOSIS — D500 Iron deficiency anemia secondary to blood loss (chronic): Secondary | ICD-10-CM

## 2019-02-12 DIAGNOSIS — G4730 Sleep apnea, unspecified: Secondary | ICD-10-CM | POA: Diagnosis not present

## 2019-02-12 DIAGNOSIS — Z7951 Long term (current) use of inhaled steroids: Secondary | ICD-10-CM | POA: Diagnosis not present

## 2019-02-12 DIAGNOSIS — Z87891 Personal history of nicotine dependence: Secondary | ICD-10-CM | POA: Diagnosis not present

## 2019-02-12 MED ORDER — SODIUM CHLORIDE 0.9 % IV SOLN
750.0000 mg | Freq: Once | INTRAVENOUS | Status: AC
  Administered 2019-02-12: 750 mg via INTRAVENOUS
  Filled 2019-02-12: qty 15

## 2019-02-12 MED ORDER — SODIUM CHLORIDE 0.9 % IV SOLN
Freq: Once | INTRAVENOUS | Status: AC
  Administered 2019-02-12: 14:00:00 via INTRAVENOUS
  Filled 2019-02-12: qty 250

## 2019-02-12 NOTE — Patient Instructions (Signed)
Ferumoxytol injection What is this medicine? FERUMOXYTOL is an iron complex. Iron is used to make healthy red blood cells, which carry oxygen and nutrients throughout the body. This medicine is used to treat iron deficiency anemia. This medicine may be used for other purposes; ask your health care provider or pharmacist if you have questions. COMMON BRAND NAME(S): Feraheme What should I tell my health care provider before I take this medicine? They need to know if you have any of these conditions: -anemia not caused by low iron levels -high levels of iron in the blood -magnetic resonance imaging (MRI) test scheduled -an unusual or allergic reaction to iron, other medicines, foods, dyes, or preservatives -pregnant or trying to get pregnant -breast-feeding How should I use this medicine? This medicine is for injection into a vein. It is given by a health care professional in a hospital or clinic setting. Talk to your pediatrician regarding the use of this medicine in children. Special care may be needed. Overdosage: If you think you have taken too much of this medicine contact a poison control center or emergency room at once. NOTE: This medicine is only for you. Do not share this medicine with others. What if I miss a dose? It is important not to miss your dose. Call your doctor or health care professional if you are unable to keep an appointment. What may interact with this medicine? This medicine may interact with the following medications: -other iron products This list may not describe all possible interactions. Give your health care provider a list of all the medicines, herbs, non-prescription drugs, or dietary supplements you use. Also tell them if you smoke, drink alcohol, or use illegal drugs. Some items may interact with your medicine. What should I watch for while using this medicine? Visit your doctor or healthcare professional regularly. Tell your doctor or healthcare professional  if your symptoms do not start to get better or if they get worse. You may need blood work done while you are taking this medicine. You may need to follow a special diet. Talk to your doctor. Foods that contain iron include: whole grains/cereals, dried fruits, beans, or peas, leafy green vegetables, and organ meats (liver, kidney). What side effects may I notice from receiving this medicine? Side effects that you should report to your doctor or health care professional as soon as possible: -allergic reactions like skin rash, itching or hives, swelling of the face, lips, or tongue -breathing problems -changes in blood pressure -feeling faint or lightheaded, falls -fever or chills -flushing, sweating, or hot feelings -swelling of the ankles or feet Side effects that usually do not require medical attention (report to your doctor or health care professional if they continue or are bothersome): -diarrhea -headache -nausea, vomiting -stomach pain This list may not describe all possible side effects. Call your doctor for medical advice about side effects. You may report side effects to FDA at 1-800-FDA-1088. Where should I keep my medicine? This drug is given in a hospital or clinic and will not be stored at home. NOTE: This sheet is a summary. It may not cover all possible information. If you have questions about this medicine, talk to your doctor, pharmacist, or health care provider.  2019 Elsevier/Gold Standard (2016-09-25 20:21:10)  Coronavirus (COVID-19) Are you at risk?  Are you at risk for the Coronavirus (COVID-19)?  To be considered HIGH RISK for Coronavirus (COVID-19), you have to meet the following criteria:  . Traveled to [place], [place], [place], [place] or  [place]; or in the [place] to [employer], [place], [place], or [place]; and have fever, cough, and shortness of breath within the last 2 weeks of travel OR . Been in close contact with a person diagnosed with COVID-19  within the last 2 weeks and have fever, cough, and shortness of breath . IF YOU DO NOT MEET THESE CRITERIA, YOU ARE CONSIDERED LOW RISK FOR COVID-19.  What to do if you are HIGH RISK for COVID-19?  Marland Kitchen If you are having a medical emergency, call 911. . Seek medical care right away. Before you go to a doctor's office, urgent care or emergency department, call ahead and tell them about your recent travel, contact with someone diagnosed with COVID-19, and your symptoms. You should receive instructions from your physician's office regarding next steps of care.  . When you arrive at healthcare provider, tell the healthcare staff immediately you have returned from visiting [place], [place], [place], [place] or [place]; or traveled in the [place] to [place], [place], [place], or [place]; in the last two weeks or you have been in close contact with a person diagnosed with COVID-19 in the last 2 weeks.   . Tell the health care staff about your symptoms: fever, cough and shortness of breath. . After you have been seen by a medical provider, you will be either: o Tested for (COVID-19) and discharged home on quarantine except to seek medical care if symptoms worsen, and asked to  - Stay home and avoid contact with others until you get your results (4-5 days)  - Avoid travel on public transportation if possible (such as bus, train, or airplane) or o Sent to the Emergency Department by EMS for evaluation, COVID-19 testing, and possible admission depending on your condition and test results.  What to do if you are LOW RISK for COVID-19?  Reduce your risk of any infection by using the same precautions used for avoiding the common cold or flu:  Marland Kitchen Wash your hands often with soap and warm water for at least 20 seconds.  If soap and water are not readily available, use an alcohol-based hand sanitizer with at least 60% alcohol.  . If coughing or sneezing, cover your mouth and nose by coughing or sneezing  into the elbow areas of your shirt or coat, into a tissue or into your sleeve (not your hands). . Avoid shaking hands with others and consider head nods or verbal greetings only. . Avoid touching your eyes, nose, or mouth with unwashed hands.  . Avoid close contact with people who are sick. . Avoid places or events with large numbers of people in one location, like concerts or sporting events. . Carefully consider travel plans you have or are making. . If you are planning any travel outside or inside the [place], visit the CDC's Travelers' Health webpage for the latest health notices. . If you have some symptoms but not all symptoms, continue to monitor at home and seek medical attention if your symptoms worsen. . If you are having a medical emergency, call 911.   [place] / e-Visit: [email or web address]         [facility]: [facility]: [phone]                   [facility]: [phone]

## 2019-02-17 ENCOUNTER — Encounter: Payer: Self-pay | Admitting: Family Medicine

## 2019-02-17 ENCOUNTER — Ambulatory Visit (INDEPENDENT_AMBULATORY_CARE_PROVIDER_SITE_OTHER): Payer: Medicare HMO | Admitting: Family Medicine

## 2019-02-17 DIAGNOSIS — N3090 Cystitis, unspecified without hematuria: Secondary | ICD-10-CM | POA: Diagnosis not present

## 2019-02-17 MED ORDER — AMOXICILLIN 500 MG PO CAPS: 500.0000 mg | ORAL_CAPSULE | Freq: Three times a day (TID) | ORAL | 0 refills | Status: DC

## 2019-02-17 NOTE — Progress Notes (Signed)
Subjective:    Patient ID: Kimberly Obrien, female    DOB: 09-Oct-1960, 58 y.o.   MRN: [id number]   HPI: Kimberly Obrien is a 58 y.o. female presenting for onset fever 3 days ago. Went to 102. Iron infusion 2 days prior. Fever broke over night. Not currently present. Urine was dark orangy red. Pain at suprapubic and low back areas at onset. Took two left over antibiotic pills. Denies dysuria. Frequency better today. Feels pressure at suprapubic area.   Depression screen [facility] 2/9 10/08/2018 07/26/2018 07/02/2018 05/31/2018 04/05/2018  Decreased Interest 1 1 0 0 1  Down, Depressed, Hopeless 0 1 0 0 0  PHQ - 2 Score 1 2 0 0 1  Altered sleeping - 1 - - -  Tired, decreased energy - 1 - - -  Change in appetite - 0 - - -  Feeling bad or failure about yourself  - 0 - - -  Trouble concentrating - 1 - - -  Moving slowly or fidgety/restless - 0 - - -  Suicidal thoughts - 0 - - -  PHQ-9 Score - 5 - - -  Some recent data might be hidden     Relevant past medical, surgical, family and social history reviewed and updated as indicated.  Interim medical history since our last visit reviewed. Allergies and medications reviewed and updated.  ROS:  Review of Systems  Constitutional: Positive for fever.  HENT: Negative.   Eyes: Negative for visual disturbance.  Respiratory: Negative for shortness of breath.   Cardiovascular: Negative for chest pain.  Gastrointestinal: Negative for abdominal pain.  Genitourinary: Positive for frequency.  Musculoskeletal: Negative for arthralgias.     Social History   Tobacco Use  Smoking Status Former Smoker  . Packs/day: 2.00  . Years: 50.00  . Pack years: 100.00  . Types: Cigarettes  . Start date: 08/22/1977  . Quit date: 04/21/2012  . Years since quitting: 6.8  Smokeless Tobacco Never Used       Objective:     Wt Readings from Last 3 Encounters:  01/24/19 216 lb 6.4 oz (98.2 kg)  10/08/18 210 lb (95.3 kg)  07/26/18 211 lb (95.7 kg)      Exam deferred. Pt. Harboring due to COVID 19. Phone visit performed.   Assessment & Plan:   1. Cystitis     Meds ordered this encounter  Medications  . amoxicillin (AMOXIL) 500 MG capsule    Sig: Take 1 capsule (500 mg total) by mouth 3 (three) times daily.    Dispense:  21 capsule    Refill:  0    No orders of the defined types were placed in this encounter.     Diagnoses and all orders for this visit:  Cystitis  Other orders -     amoxicillin (AMOXIL) 500 MG capsule; Take 1 capsule (500 mg total) by mouth 3 (three) times daily.    Virtual Visit via telephone Note  I discussed the limitations, risks, security and privacy concerns of performing an evaluation and management service by telephone and the availability of in person appointments. The patient was identified with two identifiers. Pt.expressed understanding and agreed to proceed. Pt. Is at home. Dr. Livia Snellen is in his office.  Follow Up Instructions:   I discussed the assessment and treatment plan with the patient. The patient was provided an opportunity to ask questions and all were answered. The patient agreed with the plan and demonstrated an understanding of the instructions.  The patient was advised to call back or seek an in-person evaluation if the symptoms worsen or if the condition fails to improve as anticipated.   Total minutes including chart review and phone contact time: 12   Follow up plan: Return if symptoms worsen or fail to improve.  Kimberly Fraise, MD [place]

## 2019-02-18 ENCOUNTER — Ambulatory Visit: Payer: Medicare HMO | Admitting: Family Medicine

## 2019-02-18 ENCOUNTER — Other Ambulatory Visit: Payer: Self-pay

## 2019-02-19 ENCOUNTER — Inpatient Hospital Stay: Payer: Medicare HMO

## 2019-02-24 ENCOUNTER — Other Ambulatory Visit: Payer: Self-pay

## 2019-02-24 ENCOUNTER — Inpatient Hospital Stay: Payer: Medicare HMO | Attending: Hematology

## 2019-02-24 VITALS — BP 115/61 | HR 71 | Temp 97.9°F | Resp 16

## 2019-02-24 DIAGNOSIS — D508 Other iron deficiency anemias: Secondary | ICD-10-CM | POA: Diagnosis not present

## 2019-02-24 DIAGNOSIS — D500 Iron deficiency anemia secondary to blood loss (chronic): Secondary | ICD-10-CM

## 2019-02-24 MED ORDER — SODIUM CHLORIDE 0.9 % IV SOLN
750.0000 mg | Freq: Once | INTRAVENOUS | Status: AC
  Administered 2019-02-24: 750 mg via INTRAVENOUS
  Filled 2019-02-24: qty 15

## 2019-02-24 MED ORDER — SODIUM CHLORIDE 0.9 % IV SOLN
Freq: Once | INTRAVENOUS | Status: AC
  Administered 2019-02-24: 14:00:00 via INTRAVENOUS
  Filled 2019-02-24: qty 250

## 2019-02-24 NOTE — Patient Instructions (Signed)
Ferric carboxymaltose injection What is this medicine? FERRIC CARBOXYMALTOSE (ferr-ik car-box-ee-mol-toes) is an iron complex. Iron is used to make healthy red blood cells, which carry oxygen and nutrients throughout the body. This medicine is used to treat anemia in people with chronic kidney disease or people who cannot take iron by mouth. This medicine may be used for other purposes; ask your health care provider or pharmacist if you have questions. COMMON BRAND NAME(S): Injectafer What should I tell my health care provider before I take this medicine? They need to know if you have any of these conditions:  high levels of iron in the blood  liver disease  an unusual or allergic reaction to iron, other medicines, foods, dyes, or preservatives  pregnant or trying to get pregnant  breast-feeding How should I use this medicine? This medicine is for infusion into a vein. It is given by a health care professional in a hospital or clinic setting. Talk to your pediatrician regarding the use of this medicine in children. Special care may be needed. Overdosage: If you think you have taken too much of this medicine contact a poison control center or emergency room at once. NOTE: This medicine is only for you. Do not share this medicine with others. What if I miss a dose? It is important not to miss your dose. Call your doctor or health care professional if you are unable to keep an appointment. What may interact with this medicine? Do not take this medicine with any of the following medications:  deferoxamine  dimercaprol  other iron products This list may not describe all possible interactions. Give your health care provider a list of all the medicines, herbs, non-prescription drugs, or dietary supplements you use. Also tell them if you smoke, drink alcohol, or use illegal drugs. Some items may interact with your medicine. What should I watch for while using this medicine? Visit your  doctor or health care professional regularly. Tell your doctor if your symptoms do not start to get better or if they get worse. You may need blood work done while you are taking this medicine. You may need to follow a special diet. Talk to your doctor. Foods that contain iron include: whole grains/cereals, dried fruits, beans, or peas, leafy green vegetables, and organ meats (liver, kidney). What side effects may I notice from receiving this medicine? Side effects that you should report to your doctor or health care professional as soon as possible:  allergic reactions like skin rash, itching or hives, swelling of the face, lips, or tongue  dizziness  facial flushing Side effects that usually do not require medical attention (report to your doctor or health care professional if they continue or are bothersome):  changes in taste  constipation  headache  nausea, vomiting  pain, redness, or irritation at site where injected This list may not describe all possible side effects. Call your doctor for medical advice about side effects. You may report side effects to FDA at 1-800-FDA-1088. Where should I keep my medicine? This drug is given in a hospital or clinic and will not be stored at home. NOTE: This sheet is a summary. It may not cover all possible information. If you have questions about this medicine, talk to your doctor, pharmacist, or health care provider.  2020 Elsevier/Gold Standard (2016-09-21 09:40:29)  Coronavirus (COVID-19) Are you at risk?  Are you at risk for the Coronavirus (COVID-19)?  To be considered HIGH RISK for Coronavirus (COVID-19), you have to meet the following   criteria:  . Traveled to [place], [place], [place], [place] or [place]; or in the [place] to [place], [place], [place], or [place]; and have fever, cough, and shortness of breath within the last 2 weeks of travel OR . Been in close contact with a person diagnosed with COVID-19 within the  last 2 weeks and have fever, cough, and shortness of breath . IF YOU DO NOT MEET THESE CRITERIA, YOU ARE CONSIDERED LOW RISK FOR COVID-19.  What to do if you are HIGH RISK for COVID-19?  . If you are having a medical emergency, call 911. . Seek medical care right away. Before you go to a doctor's office, urgent care or emergency department, call ahead and tell them about your recent travel, contact with someone diagnosed with COVID-19, and your symptoms. You should receive instructions from your physician's office regarding next steps of care.  . When you arrive at healthcare provider, tell the healthcare staff immediately you have returned from visiting [place], [place], [place], [place] or [place]; or traveled in the [place] to [place], [place], [place], or [place]; in the last two weeks or you have been in close contact with a person diagnosed with COVID-19 in the last 2 weeks.   . Tell the health care staff about your symptoms: fever, cough and shortness of breath. . After you have been seen by a medical provider, you will be either: o Tested for (COVID-19) and discharged home on quarantine except to seek medical care if symptoms worsen, and asked to  - Stay home and avoid contact with others until you get your results (4-5 days)  - Avoid travel on public transportation if possible (such as bus, train, or airplane) or o Sent to the Emergency Department by EMS for evaluation, COVID-19 testing, and possible admission depending on your condition and test results.  What to do if you are LOW RISK for COVID-19?  Reduce your risk of any infection by using the same precautions used for avoiding the common cold or flu:  . Wash your hands often with soap and warm water for at least 20 seconds.  If soap and water are not readily available, use an alcohol-based hand sanitizer with at least 60% alcohol.  . If coughing or sneezing, cover your mouth and nose by coughing or sneezing into the elbow  areas of your shirt or coat, into a tissue or into your sleeve (not your hands). . Avoid shaking hands with others and consider head nods or verbal greetings only. . Avoid touching your eyes, nose, or mouth with unwashed hands.  . Avoid close contact with people who are sick. . Avoid places or events with large numbers of people in one location, like concerts or sporting events. . Carefully consider travel plans you have or are making. . If you are planning any travel outside or inside the [place], visit the CDC's Travelers' Health webpage for the latest health notices. . If you have some symptoms but not all symptoms, continue to monitor at home and seek medical attention if your symptoms worsen. . If you are having a medical emergency, call 911.   ADDITIONAL HEALTHCARE OPTIONS FOR PATIENTS  [place] [facility] / e-Visit: [email or web address]         [facility]: [phone]  [facility]: [phone]                   [facility]: [phone]   

## 2019-02-28 ENCOUNTER — Ambulatory Visit: Payer: Medicare HMO | Admitting: Hematology

## 2019-03-20 ENCOUNTER — Telehealth: Payer: Self-pay | Admitting: Family Medicine

## 2019-03-20 NOTE — Chronic Care Management (AMB) (Signed)
Chronic Care Management   Note  03/20/2019 Name: Kimberly Obrien MRN: [id number] DOB: 20-Jan-1961  Kimberly Obrien is a 58 y.o. year old female who is a primary care patient of Stacks, Cletus Gash, MD. I reached out to [name] by phone today in response to a referral sent by [name]'s health plan.    [name] was given information about Chronic Care Management services today including:  1. CCM service includes personalized support from designated clinical staff supervised by her physician, including individualized plan of care and coordination with other care providers 2. 24/7 contact phone numbers for assistance for urgent and routine care needs. 3. Service will only be billed when office clinical staff spend 20 minutes or more in a month to coordinate care. 4. Only one practitioner may furnish and bill the service in a calendar month. 5. The patient may stop CCM services at any time (effective at the end of the month) by phone call to the office staff. 6. The patient will be responsible for cost sharing (co-pay) of up to 20% of the service fee (after annual deductible is met).  Patient did not agree to enrollment in care management services and does not wish to consider at this time.  Follow up plan: The patient has been provided with contact information for the chronic care management team and has been advised to call with any health related questions or concerns.   [place]  ??[email or web address]   ??[phone]

## 2019-04-10 ENCOUNTER — Other Ambulatory Visit: Payer: Self-pay

## 2019-04-11 ENCOUNTER — Ambulatory Visit (INDEPENDENT_AMBULATORY_CARE_PROVIDER_SITE_OTHER): Payer: Medicare HMO | Admitting: Family Medicine

## 2019-04-11 ENCOUNTER — Encounter: Payer: Self-pay | Admitting: Family Medicine

## 2019-04-11 ENCOUNTER — Ambulatory Visit (INDEPENDENT_AMBULATORY_CARE_PROVIDER_SITE_OTHER): Payer: Medicare HMO

## 2019-04-11 ENCOUNTER — Telehealth: Payer: Self-pay | Admitting: Family Medicine

## 2019-04-11 VITALS — BP 98/59 | HR 73 | Temp 99.3°F | Ht 64.0 in | Wt 213.0 lb

## 2019-04-11 DIAGNOSIS — R300 Dysuria: Secondary | ICD-10-CM

## 2019-04-11 DIAGNOSIS — R52 Pain, unspecified: Secondary | ICD-10-CM

## 2019-04-11 DIAGNOSIS — M25512 Pain in left shoulder: Secondary | ICD-10-CM | POA: Diagnosis not present

## 2019-04-11 DIAGNOSIS — J811 Chronic pulmonary edema: Secondary | ICD-10-CM

## 2019-04-11 DIAGNOSIS — R079 Chest pain, unspecified: Secondary | ICD-10-CM

## 2019-04-11 DIAGNOSIS — J441 Chronic obstructive pulmonary disease with (acute) exacerbation: Secondary | ICD-10-CM | POA: Diagnosis not present

## 2019-04-11 DIAGNOSIS — R252 Cramp and spasm: Secondary | ICD-10-CM | POA: Diagnosis not present

## 2019-04-11 DIAGNOSIS — M19012 Primary osteoarthritis, left shoulder: Secondary | ICD-10-CM | POA: Diagnosis not present

## 2019-04-11 LAB — URINALYSIS, COMPLETE
Bilirubin, UA: POSITIVE — AB
Glucose, UA: NEGATIVE
Ketones, UA: NEGATIVE
Nitrite, UA: NEGATIVE
Protein,UA: NEGATIVE
Specific Gravity, UA: >1.03 — ABNORMAL HIGH (ref 1.005–1.030)
Urobilinogen, Ur: 2 mg/dL — ABNORMAL HIGH (ref 0.2–1.0)
pH, UA: 5.5 (ref 5.0–7.5)

## 2019-04-11 LAB — MICROSCOPIC EXAMINATION
Epithelial Cells (non renal): >10 /hpf — AB (ref 0–10)
Renal Epithel, UA: NONE SEEN /hpf

## 2019-04-11 MED ORDER — CIPROFLOXACIN HCL 250 MG PO TABS: 250.0000 mg | ORAL_TABLET | Freq: Two times a day (BID) | ORAL | 0 refills | Status: DC

## 2019-04-11 MED ORDER — CELECOXIB 200 MG PO CAPS: 200.0000 mg | ORAL_CAPSULE | Freq: Every day | ORAL | 5 refills | Status: DC

## 2019-04-11 MED ORDER — DULOXETINE HCL 30 MG PO CPEP: 30.0000 mg | ORAL_CAPSULE | Freq: Every day | ORAL | 1 refills | Status: DC

## 2019-04-11 NOTE — Telephone Encounter (Signed)
Medication sent to [facility].  

## 2019-04-11 NOTE — Progress Notes (Signed)
Subjective:  Patient ID: Kimberly Obrien, female    DOB: 07/12/1961  Age: 58 y.o. MRN: [id number]  CC: Medical Management of Chronic Issues   HPI Kimberly Obrien presents for chronic pain. She says she hurts all the time. She has a diull ache all over and shooting pains through all her extremities.She feels weak. She can't tolerate many of the medications she has tried. She is upset that nothing seems to help.   Depression screen [facility] 2/9 04/11/2019 10/08/2018 07/26/2018  Decreased Interest 0 1 1  Down, Depressed, Hopeless 0 0 1  PHQ - 2 Score 0 1 2  Altered sleeping 0 - 1  Tired, decreased energy 0 - 1  Change in appetite 0 - 0  Feeling bad or failure about yourself  0 - 0  Trouble concentrating 0 - 1  Moving slowly or fidgety/restless 0 - 0  Suicidal thoughts 0 - 0  PHQ-9 Score 0 - 5  Some recent data might be hidden    History Kimberly Obrien has a past medical history of Anticoagulation monitoring by pharmacist, Arthritis, Asthma, Autoimmune hepatitis ([place]), Cataract, COPD (chronic obstructive pulmonary disease) ([place]), Dyspnea, Hyperlipidemia, PE (pulmonary embolism), and Sleep apnea.   She has a past surgical history that includes Tubal ligation; Wrist surgery (Right); Cholecystectomy; Spine surgery; Abdominal hysterectomy; Eye surgery; left and right heart catheterization with coronary angiogram (N/A, 05/13/2014); Incontinence surgery (2009); Cataract extraction, bilateral (Bilateral); Esophagogastroduodenoscopy (egd) with propofol (N/A, 05/07/2017); Colonoscopy with propofol (N/A, 05/07/2017); IR Venogram Hepatic W Hemodynamic Evaluation (03/15/2018); and IR Transcatheter BX (03/15/2018).   Her family history includes Bipolar disorder in her father; Cirrhosis in her mother; Heart failure in her father; Hyperlipidemia in her father; Hypertension in her brother; Obesity in her daughter.She reports that she quit smoking about 6 years ago. Her smoking use included cigarettes. She started  smoking about 41 years ago. She has a 100.00 pack-year smoking history. She has never used smokeless tobacco. She reports that she does not drink alcohol or use drugs.    ROS Review of Systems  Constitutional: Negative.   HENT: Negative for congestion.   Eyes: Negative for visual disturbance.  Respiratory: Positive for shortness of breath.   Cardiovascular: Positive for chest pain.  Gastrointestinal: Negative for abdominal pain, constipation, diarrhea, nausea and vomiting.  Genitourinary: Negative for difficulty urinating.  Musculoskeletal: Negative for arthralgias and myalgias.  Neurological: Negative for headaches.  Psychiatric/Behavioral: Negative for sleep disturbance.    Objective:  BP (!) 98/59   Pulse 73   Temp 99.3 F (37.4 C) (Oral)   Ht 5\' 4"  (1.626 m)   Wt 213 lb (96.6 kg)   BMI 36.56 kg/m   BP Readings from Last 3 Encounters:  04/11/19 (!) 98/59  02/24/19 115/61  02/12/19 (!) 129/52    Wt Readings from Last 3 Encounters:  04/11/19 213 lb (96.6 kg)  01/24/19 216 lb 6.4 oz (98.2 kg)  10/08/18 210 lb (95.3 kg)     Physical Exam Constitutional:      General: She is not in acute distress.    Appearance: She is well-developed.  HENT:     Head: Normocephalic and atraumatic.     Right Ear: External ear normal.     Left Ear: External ear normal.     Nose: Nose normal.  Eyes:     Conjunctiva/sclera: Conjunctivae normal.     Pupils: Pupils are equal, round, and reactive to light.  Neck:     Musculoskeletal: Normal range of motion  and neck supple.     Thyroid: No thyromegaly.  Cardiovascular:     Rate and Rhythm: Normal rate and regular rhythm.     Heart sounds: Normal heart sounds. No murmur.  Pulmonary:     Effort: Pulmonary effort is normal. No respiratory distress.     Breath sounds: Normal breath sounds. No wheezing or rales.  Abdominal:     General: Bowel sounds are normal. There is no distension.     Palpations: Abdomen is soft.     Tenderness:  There is no abdominal tenderness.  Lymphadenopathy:     Cervical: No cervical adenopathy.  Skin:    General: Skin is warm and dry.  Neurological:     Mental Status: She is alert and oriented to person, place, and time.     Deep Tendon Reflexes: Reflexes are normal and symmetric.  Psychiatric:        Behavior: Behavior normal.        Thought Content: Thought content normal.        Judgment: Judgment normal.       Assessment & Plan:   Kimberly Obrien was seen today for medical management of chronic issues.  Diagnoses and all orders for this visit:  Pain -     DG Shoulder Left; Future -     celecoxib (CELEBREX) 200 MG capsule; Take 1 capsule (200 mg total) by mouth daily. With food -     DULoxetine (CYMBALTA) 30 MG capsule; Take 1 capsule (30 mg total) by mouth daily.  Muscle cramps -     Cyclic Citrul Peptide Antibody, IGG -     Phosphorus -     Vitamin B12 -     Vitamin B6 -     Sedimentation rate -     Iron and TIBC([facility]) -     Ferritin -     ANA -     Rheumatoid factor -     Magnesium -     Vitamin C  Dysuria -     Urinalysis, Complete -     Urine Culture -     Microscopic Examination  Chronic pulmonary edema -     Ambulatory referral to Cardiology  Chronic obstructive pulmonary disease with acute exacerbation (HCC)  Chest pain, unspecified type -     Ambulatory referral to Cardiology  Other orders -     Discontinue: ciprofloxacin (CIPRO) 250 MG tablet; Take 1 tablet (250 mg total) by mouth 2 (two) times daily. -     CYCLIC CITRUL PEPTIDE ANTIBODY, IGG/IGA       I have discontinued Kimberly Obrien's fluticasone, amoxicillin-clavulanate, Milnacipran HCl, Vitamin D (Ergocalciferol), and amoxicillin. I am also having her start on celecoxib and DULoxetine. Additionally, I am having her maintain her albuterol and budesonide-formoterol.  Allergies as of 04/11/2019      Reactions   Aciphex [rabeprazole Sodium] Rash   Fluconazole Rash, Hives    Lansoprazole Hives   Sucralfate Nausea Only   Vancomycin Diarrhea   Claritin [loratadine] Other (See Comments)   Tired   Crestor [rosuvastatin] Other (See Comments)   Elevated blood glucose and abdominal pain   Doxycycline Rash   Ibuprofen Other (See Comments)   Upset stomach   Lipitor [atorvastatin] Other (See Comments)   Myalgias, leg pain.  Problems with pancreas, sugars went up also.   Macrobid [nitrofurantoin Macrocrystal] Nausea And Vomiting   Metoprolol Nausea And Vomiting   Metronidazole Other (See Comments), Nausea Only   Dizziness, nausea, dry mouth  and some shortness of breath   Nsaids Other (See Comments)   Upset stomach   Omeprazole    bloating   Rabeprazole Rash   Septra [sulfamethoxazole-trimethoprim] Nausea And Vomiting      Medication List       Accurate as of April 11, 2019 11:59 PM. If you have any questions, ask your nurse or doctor.        STOP taking these medications   amoxicillin 500 MG capsule Commonly known as: AMOXIL Stopped by: Claretta Fraise, MD   amoxicillin-clavulanate [phone] MG tablet Commonly known as: AUGMENTIN Stopped by: Claretta Fraise, MD   fluticasone 50 MCG/ACT nasal spray Commonly known as: FLONASE Stopped by: Claretta Fraise, MD   Milnacipran HCl 12.5 & 25 & 50 MG Misc Stopped by: Claretta Fraise, MD   Vitamin D (Ergocalciferol) 1.25 MG (50000 UT) Caps capsule Commonly known as: DRISDOL Stopped by: Claretta Fraise, MD     TAKE these medications   albuterol 108 (90 Base) MCG/ACT inhaler Commonly known as: VENTOLIN HFA Inhale into the lungs every 6 (six) hours as needed for wheezing or shortness of breath.   budesonide-formoterol 80-4.5 MCG/ACT inhaler Commonly known as: SYMBICORT INHALE TWO PUFFS BY MOUTH ONCE DAILY   celecoxib 200 MG capsule Commonly known as: CeleBREX Take 1 capsule (200 mg total) by mouth daily. With food Started by: Claretta Fraise, MD   ciprofloxacin 250 MG tablet Commonly known as: Cipro Take 1  tablet (250 mg total) by mouth 2 (two) times daily. Started by: Claretta Fraise, MD   DULoxetine 30 MG capsule Commonly known as: Cymbalta Take 1 capsule (30 mg total) by mouth daily. Started by: Claretta Fraise, MD        Follow-up: No follow-ups on file.  Claretta Fraise, M.D.

## 2019-04-12 LAB — URINE CULTURE

## 2019-04-14 ENCOUNTER — Encounter: Payer: Self-pay | Admitting: Family Medicine

## 2019-04-16 ENCOUNTER — Other Ambulatory Visit: Payer: Self-pay | Admitting: Family Medicine

## 2019-04-16 ENCOUNTER — Telehealth: Payer: Self-pay

## 2019-04-16 NOTE — Telephone Encounter (Signed)
Called pt to set up OV with McAlhany as soon as poissible. Left message asking pt to call the office.

## 2019-04-17 ENCOUNTER — Other Ambulatory Visit: Payer: Medicare HMO

## 2019-04-17 ENCOUNTER — Other Ambulatory Visit: Payer: Self-pay

## 2019-04-17 LAB — RHEUMATOID FACTOR: Rheumatoid fact SerPl-aCnc: <10 IU/mL (ref 0.0–13.9)

## 2019-04-17 LAB — VITAMIN B6: Vitamin B6: 2.9 ug/L (ref 2.0–32.8)

## 2019-04-17 LAB — ANA: Anti Nuclear Antibody (ANA): POSITIVE — AB

## 2019-04-17 LAB — IRON AND TIBC
Iron Saturation: 63 % — ABNORMAL HIGH (ref 15–55)
Iron: 179 ug/dL — ABNORMAL HIGH (ref 27–159)
Total Iron Binding Capacity: 286 ug/dL (ref 250–450)
UIBC: 107 ug/dL — ABNORMAL LOW (ref 131–425)

## 2019-04-17 LAB — VITAMIN B12: Vitamin B-12: 456 pg/mL (ref 232–1245)

## 2019-04-17 LAB — MAGNESIUM: Magnesium: 1.8 mg/dL (ref 1.6–2.3)

## 2019-04-17 LAB — CYCLIC CITRUL PEPTIDE ANTIBODY, IGG/IGA: Cyclic Citrullin Peptide Ab: 5 units (ref 0–19)

## 2019-04-17 LAB — FERRITIN: Ferritin: 498 ng/mL — ABNORMAL HIGH (ref 15–150)

## 2019-04-17 LAB — PHOSPHORUS: Phosphorus: 1.5 mg/dL — ABNORMAL LOW (ref 3.0–4.3)

## 2019-04-17 LAB — VITAMIN C: Vitamin C: 0.6 mg/dL (ref 0.4–2.0)

## 2019-04-17 LAB — SEDIMENTATION RATE: Sed Rate: 2 mm/hr (ref 0–40)

## 2019-04-18 LAB — CMP14+EGFR
ALT: 58 IU/L — ABNORMAL HIGH (ref 0–32)
AST: 73 IU/L — ABNORMAL HIGH (ref 0–40)
Albumin/Globulin Ratio: 1.5 (ref 1.2–2.2)
Albumin: 3.4 g/dL — ABNORMAL LOW (ref 3.8–4.9)
Alkaline Phosphatase: 153 IU/L — ABNORMAL HIGH (ref 39–117)
BUN/Creatinine Ratio: 14 (ref 9–23)
BUN: 8 mg/dL (ref 6–24)
Bilirubin Total: 2.5 mg/dL — ABNORMAL HIGH (ref 0.0–1.2)
CO2: 23 mmol/L (ref 20–29)
Calcium: 8.6 mg/dL — ABNORMAL LOW (ref 8.7–10.2)
Chloride: 106 mmol/L (ref 96–106)
Creatinine, Ser: 0.59 mg/dL (ref 0.57–1.00)
GFR calc Af Amer: 117 mL/min/{1.73_m2} (ref >59)
GFR calc non Af Amer: 101 mL/min/{1.73_m2} (ref >59)
Globulin, Total: 2.2 g/dL (ref 1.5–4.5)
Glucose: 137 mg/dL — ABNORMAL HIGH (ref 65–99)
Potassium: 3.7 mmol/L (ref 3.5–5.2)
Sodium: 143 mmol/L (ref 134–144)
Total Protein: 5.6 g/dL — ABNORMAL LOW (ref 6.0–8.5)

## 2019-04-18 LAB — PTH, INTACT AND CALCIUM: PTH: 43 pg/mL (ref 15–65)

## 2019-04-18 LAB — SPECIMEN STATUS REPORT

## 2019-04-21 ENCOUNTER — Other Ambulatory Visit: Payer: Self-pay | Admitting: Family Medicine

## 2019-04-21 DIAGNOSIS — K760 Fatty (change of) liver, not elsewhere classified: Secondary | ICD-10-CM

## 2019-04-21 DIAGNOSIS — K754 Autoimmune hepatitis: Secondary | ICD-10-CM

## 2019-04-21 MED ORDER — AMOXICILLIN 500 MG PO CAPS: 500.0000 mg | ORAL_CAPSULE | Freq: Three times a day (TID) | ORAL | 0 refills | Status: DC

## 2019-04-22 LAB — CMP14+EGFR
ALT: 57 IU/L — ABNORMAL HIGH (ref 0–32)
AST: 90 IU/L — ABNORMAL HIGH (ref 0–40)
Albumin/Globulin Ratio: 1.9 (ref 1.2–2.2)
Albumin: 3.8 g/dL (ref 3.8–4.9)
Alkaline Phosphatase: 162 IU/L — ABNORMAL HIGH (ref 39–117)
BUN/Creatinine Ratio: 11 (ref 9–23)
BUN: 7 mg/dL (ref 6–24)
Bilirubin Total: 3.3 mg/dL — ABNORMAL HIGH (ref 0.0–1.2)
CO2: 19 mmol/L — ABNORMAL LOW (ref 20–29)
Calcium: 8.9 mg/dL (ref 8.7–10.2)
Chloride: 107 mmol/L — ABNORMAL HIGH (ref 96–106)
Creatinine, Ser: 0.66 mg/dL (ref 0.57–1.00)
GFR calc Af Amer: 113 mL/min/{1.73_m2} (ref >59)
GFR calc non Af Amer: 98 mL/min/{1.73_m2} (ref >59)
Globulin, Total: 2 g/dL (ref 1.5–4.5)
Glucose: 141 mg/dL — ABNORMAL HIGH (ref 65–99)
Potassium: 3.8 mmol/L (ref 3.5–5.2)
Sodium: 144 mmol/L (ref 134–144)
Total Protein: 5.8 g/dL — ABNORMAL LOW (ref 6.0–8.5)

## 2019-04-22 LAB — SPECIMEN STATUS REPORT

## 2019-04-24 ENCOUNTER — Other Ambulatory Visit: Payer: Self-pay

## 2019-04-24 ENCOUNTER — Other Ambulatory Visit: Payer: Medicare HMO

## 2019-04-24 DIAGNOSIS — R300 Dysuria: Secondary | ICD-10-CM

## 2019-04-25 LAB — PHOSPHORUS, URINE, TIMED
Phosphorus, 24H Ur: 1192 mg/24 hr — ABNORMAL HIGH (ref 261–1078)
Phosphorus, Ur: 99.3 mg/dL

## 2019-05-05 NOTE — Progress Notes (Signed)
Hello Antwan, ? ?Your lab result is normal and/or stable.Some minor variations that are not significant are commonly marked abnormal, but do not represent any medical problem for you. ? ?Best regards, ?Taniaya Rudder, M.D.

## 2019-05-08 ENCOUNTER — Ambulatory Visit (INDEPENDENT_AMBULATORY_CARE_PROVIDER_SITE_OTHER): Payer: Medicare HMO | Admitting: Cardiovascular Disease

## 2019-05-08 ENCOUNTER — Other Ambulatory Visit: Payer: Self-pay

## 2019-05-08 VITALS — BP 124/58 | HR 80 | Ht 64.0 in | Wt 215.0 lb

## 2019-05-08 DIAGNOSIS — R0602 Shortness of breath: Secondary | ICD-10-CM

## 2019-05-08 DIAGNOSIS — J441 Chronic obstructive pulmonary disease with (acute) exacerbation: Secondary | ICD-10-CM

## 2019-05-08 NOTE — Progress Notes (Signed)
Chief Complaint  Patient presents with  . Follow-up    dyspnea   History of Present Illness: 58 yo female with history of autoimmune hepatitis, COPD/asthma, arthritis and pulmonary embolism who is here today for cardiac follow up. She had been followed in our [facility] office by Dr. Bronson Ing. I met her in February 2017. She has been evaluated over the years for tachycardia/palpitations/fatigue/dyspnea. Echo April 2015 showed normal LV systolic and diastolic function with no valve issues. Right and left heart cath 2015 with no evidence of CAD and normal PA pressures. Cardiac monitor 2016 with rare PVCS, PACs. She has no history of atrial fibrillation. CTA chest 2015 with no PE. She did have a PE in 1999 with recurrence in 2003. She remained on coumadin because of history of recurrent PE but this was stopped in September 2018 due to GI bleeding.  Colonoscopy September 2018 with 4 polyps removed and non bleeding internal hemorrhoids as well as gastritis. She has been seen in the Hematology. She has a history of prior tobacco abuse and COPD. She has seen Pulmonary but she is only felt to have mild COPD per pt. She was seen in our office November 2019 and had c/o dyspnea and palpitations. Echo December 2019 with AYTK=16-01%, grade 1 diastolic dysfunction. No valve disease. Normal PA pressures. No pericardial effusion. Cardiac monitor December 2019 was normal.   She is here today for follow up. The patient denies any chest pain, palpitations, lower extremity edema, orthopnea, PND, dizziness, near syncope or syncope. She continue to have exertional dyspnea, even when walking on flat surfaces. No cough or fevers. She has not seen Dr. Luan Pulling in years but is still on Symbicort.   Primary Care Physician: Claretta Fraise, MD [facility])  Past Medical History:  Diagnosis Date  . Anticoagulation monitoring by pharmacist   . Arthritis   . Asthma   . Autoimmune hepatitis ([place])   . Cataract   . COPD  (chronic obstructive pulmonary disease) ([place])   . Dyspnea    exertion  . Hyperlipidemia   . PE (pulmonary embolism)   . Sleep apnea     Past Surgical History:  Procedure Laterality Date  . ABDOMINAL HYSTERECTOMY    . CATARACT EXTRACTION, BILATERAL Bilateral   . CHOLECYSTECTOMY    . COLONOSCOPY WITH PROPOFOL N/A 05/07/2017   Procedure: COLONOSCOPY WITH PROPOFOL;  Surgeon: Laurence Spates, MD;  Location: [facility] ENDOSCOPY;  Service: Endoscopy;  Laterality: N/A;  . ESOPHAGOGASTRODUODENOSCOPY (EGD) WITH PROPOFOL N/A 05/07/2017   Procedure: ESOPHAGOGASTRODUODENOSCOPY (EGD) WITH PROPOFOL;  Surgeon: Laurence Spates, MD;  Location: [facility];  Service: Endoscopy;  Laterality: N/A;  . EYE SURGERY     Cataracts  . INCONTINENCE SURGERY  2009  . IR TRANSCATHETER BX  03/15/2018  . IR VENOGRAM HEPATIC W HEMODYNAMIC EVALUATION  03/15/2018  . LEFT AND RIGHT HEART CATHETERIZATION WITH CORONARY ANGIOGRAM N/A 05/13/2014   Procedure: LEFT AND RIGHT HEART CATHETERIZATION WITH CORONARY ANGIOGRAM;  Surgeon: [name], MD;  Location[facility] CATH LAB;  Service: Cardiovascular;  Laterality: N/A;  . SPINE SURGERY     L5 S1 fusion  . TUBAL LIGATION    . WRIST SURGERY Right    Had surgery twice to shave bone for blood circulation improvement.    Current Outpatient Medications  Medication Sig Dispense Refill  . albuterol (PROVENTIL HFA;VENTOLIN HFA) 108 (90 BASE) MCG/ACT inhaler Inhale into the lungs every 6 (six) hours as needed for wheezing or shortness of breath.    Marland Kitchen  budesonide-formoterol (SYMBICORT) 80-4.5 MCG/ACT inhaler INHALE TWO PUFFS BY MOUTH ONCE DAILY 11 g 0  . celecoxib (CELEBREX) 200 MG capsule Take 1 capsule (200 mg total) by mouth daily. With food 30 capsule 5   No current facility-administered medications for this visit.     Allergies  Allergen Reactions  . Aciphex [Rabeprazole Sodium] Rash  . Fluconazole Rash and Hives  . Lansoprazole Hives  . Sucralfate Nausea Only  . Vancomycin  Diarrhea  . Claritin [Loratadine] Other (See Comments)    Tired   . Crestor [Rosuvastatin] Other (See Comments)    Elevated blood glucose and abdominal pain  . Doxycycline Rash  . Ibuprofen Other (See Comments)    Upset stomach  . Lipitor [Atorvastatin] Other (See Comments)    Myalgias, leg pain.  Problems with pancreas, sugars went up also.  Santiago Bur [Nitrofurantoin Macrocrystal] Nausea And Vomiting  . Metoprolol Nausea And Vomiting  . Metronidazole Other (See Comments) and Nausea Only    Dizziness, nausea, dry mouth and some shortness of breath  . Nsaids Other (See Comments)    Upset stomach  . Omeprazole     bloating  . Rabeprazole Rash  . Septra [Sulfamethoxazole-Trimethoprim] Nausea And Vomiting    Social History   Socioeconomic History  . Marital status: Married    Spouse name: Not on file  . Number of children: 4  . Years of education: GED  . Highest education level: GED or equivalent  Occupational History  . Occupation: Unemployed  Social Needs  . Financial resource strain: Not hard at all  . Food insecurity    Worry: Never true    Inability: Never true  . Transportation needs    Medical: No    Non-medical: No  Tobacco Use  . Smoking status: Former Smoker    Packs/day: 2.00    Years: 50.00    Pack years: 100.00    Types: Cigarettes    Start date: 08/22/1977    Quit date: 04/21/2012    Years since quitting: 7.0  . Smokeless tobacco: Never Used  Substance and Sexual Activity  . Alcohol use: No  . Drug use: No  . Sexual activity: Yes  Lifestyle  . Physical activity    Days per week: 0 days    Minutes per session: 0 min  . Stress: To some extent  Relationships  . Social connections    Talks on phone: More than three times a week    Gets together: More than three times a week    Attends religious service: More than 4 times per year    Active member of club or organization: Yes    Attends meetings of clubs or organizations: More than 4 times per year     Relationship status: Married  . Intimate partner violence    Fear of current or ex partner: No    Emotionally abused: No    Physically abused: No    Forced sexual activity: No  Other Topics Concern  . Not on file  Social History Narrative  . Not on file    Family History  Problem Relation Age of Onset  . Cirrhosis Mother   . Hyperlipidemia Father   . Heart failure Father   . Bipolar disorder Father   . Obesity Daughter   . Hypertension Brother   . Heart attack Neg Hx   . Miscarriages / Stillbirths Neg Hx     Review of Systems:  As stated in the HPI and otherwise negative.  BP (!) 124/58   Pulse 80   Ht _0  (1.626 m)   Wt 215 lb (97.5 kg)   BMI 36.90 kg/m   Physical Examination:  General: Well developed, well nourished, NAD  HEENT: OP clear, mucus membranes moist  SKIN: warm, dry. No rashes. Neuro: No focal deficits  Musculoskeletal: Muscle strength 5/5 all ext  Psychiatric: Mood and affect normal  Neck: No JVD, no carotid bruits, no thyromegaly, no lymphadenopathy.  Lungs:Clear bilaterally, no wheezes, rhonci, crackles Cardiovascular: Regular rate and rhythm. No murmurs, gallops or rubs. Abdomen:Soft. Bowel sounds present. Non-tender.  Extremities: No lower extremity edema. Pulses are 2 + in the bilateral DP/PT.  EKG:  EKG is  Is ordered today. The ekg ordered today demonstrates NSR  Echo 07/24/18: Left ventricle: The cavity size was normal. There was mild focal   basal hypertrophy of the septum. Systolic function was vigorous.   The estimated ejection fraction was in the range of 65% to 70%.   Wall motion was normal; there were no regional wall motion   abnormalities. Doppler parameters are consistent with abnormal   left ventricular relaxation (grade 1 diastolic dysfunction).   There was no evidence of elevated ventricular filling pressure by   Doppler parameters. - Mitral valve: There was no regurgitation. - Right ventricle: The cavity size was  normal. Wall thickness was   normal. Systolic function was normal. - Right atrium: The atrium was normal in size. - Tricuspid valve: There was no regurgitation. - Pulmonic valve: There was no regurgitation. - Pulmonary arteries: Systolic pressure was within the normal   range. - Inferior vena cava: The vessel was normal in size. - Pericardium, extracardiac: There was no pericardial effusion.  Recent Labs: 01/24/2019: Hemoglobin 12.1; Platelets 92 04/11/2019: Magnesium 1.8 04/17/2019: ALT 58; BUN 8; Creatinine, Ser 0.59; Potassium 3.7; Sodium 143   Lipid Panel    Component Value Date/Time   CHOL 150 10/08/2018 1222   CHOL 208 (H) 02/04/2013 1317   TRIG 146 10/08/2018 1222   TRIG 110 12/21/2014 0000   TRIG 136 02/04/2013 1317   HDL 54 10/08/2018 1222   HDL 60 12/21/2014 0000   HDL 40 02/04/2013 1317   CHOLHDL 2.8 10/08/2018 1222   LDLCALC 67 10/08/2018 1222   LDLCALC 65 08/04/2013 1332   LDLCALC 141 (H) 02/04/2013 1317     Wt Readings from Last 3 Encounters:  05/08/19 215 lb (97.5 kg)  04/11/19 213 lb (96.6 kg)  01/24/19 216 lb 6.4 oz (98.2 kg)     Other studies Reviewed: Additional studies/ records that were reviewed today include: . Review of the above records demonstrates:    Assessment and Plan:   1. Dyspnea: Her dyspnea has been felt to be multi-factorial given COPD, anemia and deconditioned state. She has normal LV systolic function and no evidence of valve disease by echo December 2019. Norma PA pressures. No evidence of CAD by cardiac cath in 2015. No evidence of volume overload.  She is in sinus rhythm. Lungs clear on exam. I will refer her to see [facility] for further management of her COPD  2. Palpitations:   Rare palpitations.   Current medicines are reviewed at length with the patient today.  The patient does not have concerns regarding medicines.  The following changes have been made:  no change  Labs/ tests ordered today include: None  Orders  Placed This Encounter  Procedures  . Ambulatory referral to Pulmonology  . EKG 12-Lead   Disposition:  FU with me in 12 months  Signed, Lauree Chandler, MD 05/08/2019 12:03 PM    [address], [place], [place]  [address] Phone: [phone]; Fax: [phone]

## 2019-05-08 NOTE — Patient Instructions (Signed)
Medication Instructions:  No changes If you need a refill on your cardiac medications before your next appointment, please call your pharmacy.   Lab work: none If you have labs (blood work) drawn today and your tests are completely normal, you will receive your results only by: . MyChart Message (if you have MyChart) OR . A paper copy in the mail If you have any lab test that is abnormal or we need to change your treatment, we will call you to review the results.  Testing/Procedures: none  Follow-Up: At [facility], you and your health needs are our priority.  As part of our continuing mission to provide you with exceptional heart care, we have created designated Provider Care Teams.  These Care Teams include your primary Cardiologist (physician) and Advanced Practice Providers (APPs -  Physician Assistants and Nurse Practitioners) who all work together to provide you with the care you need, when you need it. You will need a follow up appointment in 12 months.  Please call our office 2 months in advance to schedule this appointment.  You may see Christopher McAlhany, MD or one of the following Advanced Practice Providers on your designated Care Team:   Brittainy Simmons, PA-C Dayna Dunn, PA-C . Michele Lenze, PA-C  Any Other Special Instructions Will Be Listed Below (If Applicable).    

## 2019-05-21 ENCOUNTER — Telehealth: Payer: Self-pay | Admitting: Pulmonary Disease

## 2019-05-21 NOTE — Telephone Encounter (Signed)
Called spoke with patient, she states she has none of the symptoms, has been home for 14 days, not been around anyone with covid or suspect or with symptoms. Patient denies fevers or any pending covid tests. Told patient she can still come in for visit tomorrow. If she starts to develop any symptoms different from today over night to let our office know.

## 2019-05-22 ENCOUNTER — Other Ambulatory Visit: Payer: Self-pay

## 2019-05-22 ENCOUNTER — Encounter: Payer: Self-pay | Admitting: Pulmonary Disease

## 2019-05-22 ENCOUNTER — Ambulatory Visit (INDEPENDENT_AMBULATORY_CARE_PROVIDER_SITE_OTHER): Payer: Medicare HMO | Admitting: Pulmonary Disease

## 2019-05-22 VITALS — BP 120/68 | HR 77 | Temp 98.5°F | Ht 64.0 in | Wt 217.2 lb

## 2019-05-22 DIAGNOSIS — G4733 Obstructive sleep apnea (adult) (pediatric): Secondary | ICD-10-CM

## 2019-05-22 DIAGNOSIS — J432 Centrilobular emphysema: Secondary | ICD-10-CM

## 2019-05-22 DIAGNOSIS — I2782 Chronic pulmonary embolism: Secondary | ICD-10-CM

## 2019-05-22 DIAGNOSIS — R0602 Shortness of breath: Secondary | ICD-10-CM | POA: Diagnosis not present

## 2019-05-22 DIAGNOSIS — R000 Tachycardia, unspecified: Secondary | ICD-10-CM

## 2019-05-22 MED ORDER — IPRATROPIUM-ALBUTEROL 0.5-2.5 (3) MG/3ML IN SOLN: 3.0000 mL | Freq: Four times a day (QID) | RESPIRATORY_TRACT | 1 refills | Status: DC | PRN

## 2019-05-22 NOTE — Assessment & Plan Note (Signed)
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  She had treatment emergent central apneas and probably needs BiPAP. Given her prior experience she is resistant to starting this, we will continue discussing this in future visits

## 2019-05-22 NOTE — Assessment & Plan Note (Signed)
She does have significant tachycardia on exertion which indicates deconditioning.  Also CPET.  Significant deconditioning. I have encouraged her to start on a conditioning program, she is resistant to try pulmonary rehab again.  She will start on a treadmill at least 5 minutes twice daily

## 2019-05-22 NOTE — Progress Notes (Signed)
Subjective:     Patient ID: Kimberly Obrien, female   DOB: 05/15/1961, 58 y.o.   MRN: [id number]  HPI   Chief Complaint  Patient presents with  . Consult    COPD, Shortness of breath. Has had shortness of breath for last 5 years.   58 year old ex-smoker presents for evaluation of dyspnea on exertion. She reports worsening dyspnea for the past 5 years with slow decline to the point where now she has class III symptoms on routine activities.  She reports dyspnea walking uphill, she cannot climb stairs.  She develops palpitations and can feel her heart racing. She has undergone extensive evaluation in the past including PFTs, CPT results of which are listed below.  Recent cardiac evaluation was normal including echo. She did have significant emphysema noted on CT chest. She underwent pulmonary rehab in 2016 and states that she does really did not help much. She reports a history of pulmonary bothersome x2 in the late 90s, initially she was on OCPs and then on diet medications.  Anticoagulation was stopped in 2019 when she developed bleeding due to liver abnormality requiring blood transfusion.  She underwent endoscopy and camera evaluation of her small bowel  She quit smoking in 2013, more than 30 pack years.  She is currently disabled and lives with her spouse  On walking her heart rate went up to 140 on the second lap and she desaturated to 88%, starting at 98% on room air.  She recovered within a minute She would like to avoid oxygen for now   She also has a history of severe OSA, but was only able to use CPAP for a month and then she developed "throat closing" and discontinued this Significant tests/ events reviewed  PFTs 08/2014 ratio 71, FEV1 65%, FVC 72%, mid flows 46%, TLC 92%, DLCO 53% and corrected to 70% for alveolar volume consistent with emphysema.  CT angiogram 11/2015- for PE but showed significant emphysema  Echo 07/2018 showed normal LV function, normal RVSP  C PET  11/2014 -VO2 54% of max, but 92% when adjusted for IBW, at peak exercise 68% of MVV, O2 pulse was 91% predicted.  O2 desaturation during exercise suggesting lung disease -also limited by body habitus and deconditioning  04/2016 N PSG >> AHI 38/hour, corrected by BiPAP 12/8, centrals emerged on CPAP   Past Medical History:  Diagnosis Date  . Anticoagulation monitoring by pharmacist   . Arthritis   . Asthma   . Autoimmune hepatitis ([place])   . Cataract   . COPD (chronic obstructive pulmonary disease) ([place])   . Dyspnea    exertion  . Hyperlipidemia   . PE (pulmonary embolism)   . Sleep apnea    Past Surgical History:  Procedure Laterality Date  . ABDOMINAL HYSTERECTOMY    . CATARACT EXTRACTION, BILATERAL Bilateral   . CHOLECYSTECTOMY    . COLONOSCOPY WITH PROPOFOL N/A 05/07/2017   Procedure: COLONOSCOPY WITH PROPOFOL;  Surgeon: Laurence Spates, MD;  Location: [facility];  Service: Endoscopy;  Laterality: N/A;  . ESOPHAGOGASTRODUODENOSCOPY (EGD) WITH PROPOFOL N/A 05/07/2017   Procedure: ESOPHAGOGASTRODUODENOSCOPY (EGD) WITH PROPOFOL;  Surgeon: Laurence Spates, MD;  Location: [facility];  Service: Endoscopy;  Laterality: N/A;  . EYE SURGERY     Cataracts  . INCONTINENCE SURGERY  2009  . IR TRANSCATHETER BX  03/15/2018  . IR VENOGRAM HEPATIC W HEMODYNAMIC EVALUATION  03/15/2018  . LEFT AND RIGHT HEART CATHETERIZATION WITH CORONARY ANGIOGRAM N/A 05/13/2014   Procedure: LEFT AND RIGHT HEART  CATHETERIZATION WITH CORONARY ANGIOGRAM;  Surgeon: [name], MD;  Location:  [facility] CATH LAB;  Service: Cardiovascular;  Laterality: N/A;  . SPINE SURGERY     L5 S1 fusion  . TUBAL LIGATION    . WRIST SURGERY Right    Had surgery twice to shave bone for blood circulation improvement.    Allergies  Allergen Reactions  . Aciphex [Rabeprazole Sodium] Rash  . Fluconazole Rash and Hives  . Lansoprazole Hives  . Sucralfate Nausea Only  . Vancomycin Diarrhea  . Claritin [Loratadine] Other (See  Comments)    Tired   . Crestor [Rosuvastatin] Other (See Comments)    Elevated blood glucose and abdominal pain  . Doxycycline Rash  . Ibuprofen Other (See Comments)    Upset stomach  . Lipitor [Atorvastatin] Other (See Comments)    Myalgias, leg pain.  Problems with pancreas, sugars went up also.  Santiago Bur [Nitrofurantoin Macrocrystal] Nausea And Vomiting  . Metoprolol Nausea And Vomiting  . Metronidazole Other (See Comments) and Nausea Only    Dizziness, nausea, dry mouth and some shortness of breath  . Nsaids Other (See Comments)    Upset stomach  . Omeprazole     bloating  . Rabeprazole Rash  . Septra [Sulfamethoxazole-Trimethoprim] Nausea And Vomiting    Social History   Socioeconomic History  . Marital status: Married    Spouse name: Not on file  . Number of children: 4  . Years of education: GED  . Highest education level: GED or equivalent  Occupational History  . Occupation: Unemployed  Social Needs  . Financial resource strain: Not hard at all  . Food insecurity    Worry: Never true    Inability: Never true  . Transportation needs    Medical: No    Non-medical: No  Tobacco Use  . Smoking status: Former Smoker    Packs/day: 2.00    Years: 50.00    Pack years: 100.00    Types: Cigarettes    Start date: 08/22/1977    Quit date: 04/21/2012    Years since quitting: 7.0  . Smokeless tobacco: Never Used  Substance and Sexual Activity  . Alcohol use: No  . Drug use: No  . Sexual activity: Yes  Lifestyle  . Physical activity    Days per week: 0 days    Minutes per session: 0 min  . Stress: To some extent  Relationships  . Social connections    Talks on phone: More than three times a week    Gets together: More than three times a week    Attends religious service: More than 4 times per year    Active member of club or organization: Yes    Attends meetings of clubs or organizations: More than 4 times per year    Relationship status: Married  . Intimate  partner violence    Fear of current or ex partner: No    Emotionally abused: No    Physically abused: No    Forced sexual activity: No  Other Topics Concern  . Not on file  Social History Narrative  . Not on file      Family History  Problem Relation Age of Onset  . Cirrhosis Mother   . Hyperlipidemia Father   . Heart failure Father   . Bipolar disorder Father   . Obesity Daughter   . Hypertension Brother   . Heart attack Neg Hx   . Miscarriages / Stillbirths Neg Hx  Review of Systems  Constitutional: Positive for fatigue and unexpected weight change.  HENT: Positive for dental problem, ear pain, sinus pressure, sinus pain and tinnitus.   Eyes: Negative.   Respiratory: Positive for apnea, cough and shortness of breath.   Cardiovascular: Positive for palpitations.  Gastrointestinal: Positive for abdominal pain, blood in stool, constipation and diarrhea.  Endocrine: Positive for heat intolerance.  Genitourinary: Positive for difficulty urinating, flank pain and hematuria.  Musculoskeletal: Positive for back pain and neck pain.  Skin: Positive for color change.  Allergic/Immunologic: Negative.   Neurological: Positive for dizziness and light-headedness.  Hematological: Negative.   Psychiatric/Behavioral: Positive for sleep disturbance.       Objective:   Physical Exam    Gen. Pleasant, obese, in no distress, normal affect ENT - no pallor,icterus, no post nasal drip, class 2-3 airway Neck: No JVD, no thyromegaly, no carotid bruits Lungs: no use of accessory muscles, no dullness to percussion, decreased without rales or rhonchi  Cardiovascular: Rhythm regular, heart sounds  normal, no murmurs or gallops, no peripheral edema Abdomen: soft and non-tender, no hepatosplenomegaly, BS normal. Musculoskeletal: No deformities, no cyanosis or clubbing Neuro:  alert, non focal, no tremors

## 2019-05-22 NOTE — Assessment & Plan Note (Addendum)
Cause of persistent shortness of breath is unclear She does have significant emphysema  Proceed with CT angiogram to rule out chronic PE -CTA showed significant emphysema, decreased DLCO is consistent with this.  She could also have pulmonary vascular disease although she does not have significant RV dysfunction on echo  Prescription for nebulizer with duo nebs-she prefers this to Spiriva Duo nebs twice daily and every 6 hours as needed for shortness of breath  She will qualify for oxygen but would prefer to hold off till the future

## 2019-05-22 NOTE — Patient Instructions (Signed)
Cause of persistent shortness of breath is unclear You do have significant emphysema  Proceed with CT angiogram to rule out chronic PE  Prescription for nebulizer Duo nebs twice daily and every 6 hours as needed for shortness of breath  You will qualify for oxygen  We will discuss about CPAP in the future

## 2019-05-23 ENCOUNTER — Ambulatory Visit (HOSPITAL_COMMUNITY)
Admission: RE | Admit: 2019-05-23 | Discharge: 2019-05-23 | Disposition: A | Discharge status: Home / Self Care | Payer: Medicare HMO | Source: Ambulatory Visit | Attending: Pulmonary Disease | Admitting: Pulmonary Disease

## 2019-05-23 DIAGNOSIS — I2782 Chronic pulmonary embolism: Secondary | ICD-10-CM | POA: Insufficient documentation

## 2019-05-23 DIAGNOSIS — R0602 Shortness of breath: Secondary | ICD-10-CM | POA: Insufficient documentation

## 2019-05-23 MED ORDER — IOHEXOL 350 MG/ML SOLN
100.0000 mL | Freq: Once | INTRAVENOUS | Status: AC | PRN
  Administered 2019-05-23: 100 mL via INTRAVENOUS

## 2019-05-26 NOTE — Progress Notes (Signed)
Pt aware of results of CT angio Pt aware of cirrhosis and has seen 2 GI doctors in the past. She is scheduled to see a GI doctor @ [facility] 10/28. Nothing further needed.

## 2019-05-27 DIAGNOSIS — J449 Chronic obstructive pulmonary disease, unspecified: Secondary | ICD-10-CM | POA: Diagnosis not present

## 2019-06-18 DIAGNOSIS — K7581 Nonalcoholic steatohepatitis (NASH): Secondary | ICD-10-CM | POA: Diagnosis not present

## 2019-06-18 DIAGNOSIS — K760 Fatty (change of) liver, not elsewhere classified: Secondary | ICD-10-CM | POA: Diagnosis not present

## 2019-06-18 DIAGNOSIS — E785 Hyperlipidemia, unspecified: Secondary | ICD-10-CM | POA: Diagnosis not present

## 2019-06-18 DIAGNOSIS — Z87891 Personal history of nicotine dependence: Secondary | ICD-10-CM | POA: Diagnosis not present

## 2019-06-18 DIAGNOSIS — E669 Obesity, unspecified: Secondary | ICD-10-CM | POA: Diagnosis not present

## 2019-06-18 DIAGNOSIS — R7401 Elevation of levels of liver transaminase levels: Secondary | ICD-10-CM | POA: Diagnosis not present

## 2019-06-18 DIAGNOSIS — Z1159 Encounter for screening for other viral diseases: Secondary | ICD-10-CM | POA: Diagnosis not present

## 2019-06-27 DIAGNOSIS — J449 Chronic obstructive pulmonary disease, unspecified: Secondary | ICD-10-CM | POA: Diagnosis not present

## 2019-07-01 ENCOUNTER — Other Ambulatory Visit: Payer: Self-pay

## 2019-07-02 ENCOUNTER — Other Ambulatory Visit: Payer: Self-pay

## 2019-07-03 ENCOUNTER — Ambulatory Visit (INDEPENDENT_AMBULATORY_CARE_PROVIDER_SITE_OTHER): Payer: Medicare HMO | Admitting: Family Medicine

## 2019-07-03 ENCOUNTER — Ambulatory Visit: Payer: Medicare HMO | Admitting: Pulmonary Disease

## 2019-07-03 ENCOUNTER — Encounter: Payer: Self-pay | Admitting: Family Medicine

## 2019-07-03 ENCOUNTER — Ambulatory Visit (INDEPENDENT_AMBULATORY_CARE_PROVIDER_SITE_OTHER): Payer: Medicare HMO

## 2019-07-03 VITALS — BP 112/67 | HR 88 | Temp 96.8°F | Ht 64.0 in | Wt 214.4 lb

## 2019-07-03 DIAGNOSIS — M25511 Pain in right shoulder: Secondary | ICD-10-CM

## 2019-07-03 MED ORDER — PREDNISONE 10 MG PO TABS: ORAL_TABLET | ORAL | 0 refills | Status: DC

## 2019-07-03 NOTE — Patient Instructions (Signed)
Shoulder Exercises Ask your health care provider which exercises are safe for you. Do exercises exactly as told by your health care provider and adjust them as directed. It is normal to feel mild stretching, pulling, tightness, or discomfort as you do these exercises. Stop right away if you feel sudden pain or your pain gets worse. Do not begin these exercises until told by your health care provider. Stretching exercises External rotation and abduction This exercise is sometimes called corner stretch. This exercise rotates your arm outward (external rotation) and moves your arm out from your body (abduction). 1. Stand in a doorway with one of your feet slightly in front of the other. This is called a staggered stance. If you cannot reach your forearms to the door frame, stand facing a corner of a room. 2. Choose one of the following positions as told by your health care provider: ? Place your hands and forearms on the door frame above your head. ? Place your hands and forearms on the door frame at the height of your head. ? Place your hands on the door frame at the height of your elbows. 3. Slowly move your weight onto your front foot until you feel a stretch across your chest and in the front of your shoulders. Keep your head and chest upright and keep your abdominal muscles tight. 4. Hold for ____15______ seconds. 5. To release the stretch, shift your weight to your back foot. Repeat _____3_____ times. Complete this exercise ______2____ times a day. Extension, standing 1. Stand and hold a broomstick, a cane, or a similar object behind your back. ? Your hands should be a little wider than shoulder width apart. ? Your palms should face away from your back. 2. Keeping your elbows straight and your shoulder muscles relaxed, move the stick away from your body until you feel a stretch in your shoulders (extension). ? Avoid shrugging your shoulders while you move the stick. Keep your shoulder blades  tucked down toward the middle of your back. 3. Hold for ____15______ seconds. 4. Slowly return to the starting position. Repeat ____3______ times. Complete this exercise ______2____ times a day. Range-of-motion exercises Pendulum  1. Stand near a wall or a surface that you can hold onto for balance. 2. Bend at the waist and let your left / right arm hang straight down. Use your other arm to support you. Keep your back straight and do not lock your knees. 3. Relax your left / right arm and shoulder muscles, and move your hips and your trunk so your left / right arm swings freely. Your arm should swing because of the motion of your body, not because you are using your arm or shoulder muscles. 4. Keep moving your hips and trunk so your arm swings in the following directions, as told by your health care provider: ? Side to side. ? Forward and backward. ? In clockwise and counterclockwise circles. 5. Continue each motion for ____10______ seconds, or for as long as told by your health care provider. 6. Slowly return to the starting position. Repeat ____3______ times. Complete this exercise ________2__ times a day. Shoulder flexion, standing  1. Stand and hold a broomstick, a cane, or a similar object. Place your hands a little more than shoulder width apart on the object. Your left / right hand should be palm up, and your other hand should be palm down. 2. Keep your elbow straight and your shoulder muscles relaxed. Push the stick up with your healthy arm to  raise your left / right arm in front of your body, and then over your head until you feel a stretch in your shoulder (flexion). ? Avoid shrugging your shoulder while you raise your arm. Keep your shoulder blade tucked down toward the middle of your back. 3. Hold for ______10____ seconds. 4. Slowly return to the starting position. Repeat _____3_____ times. Complete this exercise _____2_____ times a day. Shoulder abduction, standing 1. Stand and  hold a broomstick, a cane, or a similar object. Place your hands a little more than shoulder width apart on the object. Your left / right hand should be palm up, and your other hand should be palm down. 2. Keep your elbow straight and your shoulder muscles relaxed. Push the object across your body toward your left / right side. Raise your left / right arm to the side of your body (abduction) until you feel a stretch in your shoulder. ? Do not raise your arm above shoulder height unless your health care provider tells you to do that. ? If directed, raise your arm over your head. ? Avoid shrugging your shoulder while you raise your arm. Keep your shoulder blade tucked down toward the middle of your back. 3. Hold for __________ seconds. 4. Slowly return to the starting position. Repeat __________ times. Complete this exercise __________ times a day. Internal rotation  1. Place your left / right hand behind your back, palm up. 2. Use your other hand to dangle an exercise band, a towel, or a similar object over your shoulder. Grasp the band with your left / right hand so you are holding on to both ends. 3. Gently pull up on the band until you feel a stretch in the front of your left / right shoulder. The movement of your arm toward the center of your body is called internal rotation. ? Avoid shrugging your shoulder while you raise your arm. Keep your shoulder blade tucked down toward the middle of your back. 4. Hold for __________ seconds. 5. Release the stretch by letting go of the band and lowering your hands. Repeat __________ times. Complete this exercise __________ times a day. Strengthening exercises External rotation  1. Sit in a stable chair without armrests. 2. Secure an exercise band to a stable object at elbow height on your left / right side. 3. Place a soft object, such as a folded towel or a small pillow, between your left / right upper arm and your body to move your elbow about 4  inches (10 cm) away from your side. 4. Hold the end of the exercise band so it is tight and there is no slack. 5. Keeping your elbow pressed against the soft object, slowly move your forearm out, away from your abdomen (external rotation). Keep your body steady so only your forearm moves. 6. Hold for __________ seconds. 7. Slowly return to the starting position. Repeat __________ times. Complete this exercise __________ times a day. Shoulder abduction  1. Sit in a stable chair without armrests, or stand up. 2. Hold a __________ weight in your left / right hand, or hold an exercise band with both hands. 3. Start with your arms straight down and your left / right palm facing in, toward your body. 4. Slowly lift your left / right hand out to your side (abduction). Do not lift your hand above shoulder height unless your health care provider tells you that this is safe. ? Keep your arms straight. ? Avoid shrugging your shoulder while you  do this movement. Keep your shoulder blade tucked down toward the middle of your back. 5. Hold for __________ seconds. 6. Slowly lower your arm, and return to the starting position. Repeat __________ times. Complete this exercise __________ times a day. Shoulder extension 1. Sit in a stable chair without armrests, or stand up. 2. Secure an exercise band to a stable object in front of you so it is at shoulder height. 3. Hold one end of the exercise band in each hand. Your palms should face each other. 4. Straighten your elbows and lift your hands up to shoulder height. 5. Step back, away from the secured end of the exercise band, until the band is tight and there is no slack. 6. Squeeze your shoulder blades together as you pull your hands down to the sides of your thighs (extension). Stop when your hands are straight down by your sides. Do not let your hands go behind your body. 7. Hold for __________ seconds. 8. Slowly return to the starting position. Repeat  __________ times. Complete this exercise __________ times a day. Shoulder row 1. Sit in a stable chair without armrests, or stand up. 2. Secure an exercise band to a stable object in front of you so it is at waist height. 3. Hold one end of the exercise band in each hand. Position your palms so that your thumbs are facing the ceiling (neutral position). 4. Bend each of your elbows to a 90-degree angle (right angle) and keep your upper arms at your sides. 5. Step back until the band is tight and there is no slack. 6. Slowly pull your elbows back behind you. 7. Hold for __________ seconds. 8. Slowly return to the starting position. Repeat __________ times. Complete this exercise __________ times a day. Shoulder press-ups  1. Sit in a stable chair that has armrests. Sit upright, with your feet flat on the floor. 2. Put your hands on the armrests so your elbows are bent and your fingers are pointing forward. Your hands should be about even with the sides of your body. 3. Push down on the armrests and use your arms to lift yourself off the chair. Straighten your elbows and lift yourself up as much as you comfortably can. ? Move your shoulder blades down, and avoid letting your shoulders move up toward your ears. ? Keep your feet on the ground. As you get stronger, your feet should support less of your body weight as you lift yourself up. 4. Hold for __________ seconds. 5. Slowly lower yourself back into the chair. Repeat __________ times. Complete this exercise __________ times a day. Wall push-ups  1. Stand so you are facing a stable wall. Your feet should be about one arm-length away from the wall. 2. Lean forward and place your palms on the wall at shoulder height. 3. Keep your feet flat on the floor as you bend your elbows and lean forward toward the wall. 4. Hold for __________ seconds. 5. Straighten your elbows to push yourself back to the starting position. Repeat __________ times.  Complete this exercise __________ times a day. This information is not intended to replace advice given to you by your health care provider. Make sure you discuss any questions you have with your health care provider. Document Released: 06/21/2005 Document Revised: 11/29/2018 Document Reviewed: 09/06/2018 Elsevier Patient Education  2020 [employer].

## 2019-07-03 NOTE — Progress Notes (Signed)
Chief Complaint  Patient presents with  . Shoulder Pain    bilateral     HPI  Patient presents today for bilateral shoulder pain increasing over the last several weeks. It is painful to raise her arma at all, and impossible to raise them over her shoulders.   PMH: Smoking status noted ROS: Per HPI  Objective: BP 112/67   Pulse 88   Temp (!) 96.8 F (36 C) (Temporal)   Ht 5\' 4"  (1.626 m)   Wt 214 lb 6.4 oz (97.3 kg)   SpO2 95%   BMI 36.80 kg/m  Gen: NAD, alert, cooperative with exam HEENT: NCAT, EOMI, PERRL CV: RRR, good S1/S2, no murmur Resp: CTABL, no wheezes, non-labored Ext: No edema, warm. Decreased ROM BUE for shoulder abduction and rotation.  Neuro: Alert and oriented, No gross deficits  Assessment and plan:  1. Acute pain of right shoulder     Meds ordered this encounter  Medications  . predniSONE (DELTASONE) 10 MG tablet    Sig: Take 5 daily for 3 days followed by 4,3,2 and 1 for 3 days each.    Dispense:  45 tablet    Refill:  0    Orders Placed This Encounter  Procedures  . DG Shoulder Right    Standing Status:   Future    Number of Occurrences:   1    Standing Expiration Date:   09/01/2020    Order Specific Question:   Reason for Exam (SYMPTOM  OR DIAGNOSIS REQUIRED)    Answer:   pain at [facility] area, decreased rotation and abduction    Order Specific Question:   Is the patient pregnant?    Answer:   No    Order Specific Question:   Preferred imaging location?    Answer:   Internal    Follow up as needed.  Claretta Fraise, MD

## 2019-07-04 ENCOUNTER — Telehealth: Payer: Self-pay | Admitting: Hematology

## 2019-07-04 NOTE — Telephone Encounter (Signed)
Returned patient's phone call regarding rescheduling an appointment, left a voicemail. 

## 2019-07-08 ENCOUNTER — Telehealth: Payer: Self-pay | Admitting: Hematology

## 2019-07-08 NOTE — Telephone Encounter (Signed)
Returned patient's phone call regarding rescheduling an appointment, per patient's request 12/04 appointment has moved to 01/25.

## 2019-07-08 NOTE — Telephone Encounter (Signed)
Faxed records to [facility] [phone]

## 2019-07-15 ENCOUNTER — Telehealth: Payer: Self-pay | Admitting: *Deleted

## 2019-07-15 DIAGNOSIS — Z1231 Encounter for screening mammogram for malignant neoplasm of breast: Secondary | ICD-10-CM | POA: Diagnosis not present

## 2019-07-15 NOTE — Telephone Encounter (Signed)
Late entry for 07/04/2019: Patient called and requested Dr. Jonnie Finner notes and labs from 2019 and 2020 faxed to [facility] [phone]. Inbasket request sent to [facility] on 07/04/2019

## 2019-07-25 ENCOUNTER — Other Ambulatory Visit: Payer: Medicare HMO

## 2019-07-25 ENCOUNTER — Ambulatory Visit: Payer: Medicare HMO | Admitting: Hematology

## 2019-07-27 DIAGNOSIS — J449 Chronic obstructive pulmonary disease, unspecified: Secondary | ICD-10-CM | POA: Diagnosis not present

## 2019-07-28 ENCOUNTER — Other Ambulatory Visit: Payer: Self-pay

## 2019-07-28 ENCOUNTER — Ambulatory Visit (INDEPENDENT_AMBULATORY_CARE_PROVIDER_SITE_OTHER): Payer: Medicare HMO | Admitting: Family Medicine

## 2019-07-28 DIAGNOSIS — E119 Type 2 diabetes mellitus without complications: Secondary | ICD-10-CM

## 2019-07-28 MED ORDER — METFORMIN HCL ER 750 MG PO TB24: 750.0000 mg | ORAL_TABLET | Freq: Every day | ORAL | 5 refills | Status: DC

## 2019-07-29 ENCOUNTER — Encounter: Payer: Self-pay | Admitting: Family Medicine

## 2019-07-29 NOTE — Progress Notes (Signed)
Subjective:    Patient ID: Kimberly Obrien, female    DOB: [birth year], 58 y.o.   MRN: [id number]   HPI: Kimberly Obrien is a 58 y.o. female presenting for checked her glucose several times recently with her husbands monitor. The readings were frequently in the 250 range. She started checking due to polyuria and polydipsia. She has not had excessive nausea or vomiting.    Depression screen [facility] 2/9 07/03/2019 04/11/2019 10/08/2018 07/26/2018 07/02/2018  Decreased Interest 0 0 1 1 0  Down, Depressed, Hopeless 0 0 0 1 0  PHQ - 2 Score 0 0 1 2 0  Altered sleeping - 0 - 1 -  Tired, decreased energy - 0 - 1 -  Change in appetite - 0 - 0 -  Feeling bad or failure about yourself  - 0 - 0 -  Trouble concentrating - 0 - 1 -  Moving slowly or fidgety/restless - 0 - 0 -  Suicidal thoughts - 0 - 0 -  PHQ-9 Score - 0 - 5 -  Some recent data might be hidden     Relevant past medical, surgical, family and social history reviewed and updated as indicated.  Interim medical history since our last visit reviewed. Allergies and medications reviewed and updated.  ROS:  Review of Systems  Constitutional: Negative.   HENT: Negative.   Eyes: Negative for visual disturbance.  Respiratory: Negative for shortness of breath.   Cardiovascular: Negative for chest pain.  Gastrointestinal: Negative for abdominal pain.  Endocrine: Positive for polydipsia.     Social History   Tobacco Use  Smoking Status Former Smoker  . Packs/day: 2.00  . Years: 50.00  . Pack years: 100.00  . Types: Cigarettes  . Start date: 08/22/1977  . Quit date: 04/21/2012  . Years since quitting: 7.2  Smokeless Tobacco Never Used       Objective:     Wt Readings from Last 3 Encounters:  07/03/19 214 lb 6.4 oz (97.3 kg)  05/22/19 217 lb 3.2 oz (98.5 kg)  05/08/19 215 lb (97.5 kg)     Exam deferred. Pt. Harboring due to COVID 19. Phone visit performed.   Assessment & Plan:   1. Diabetes mellitus without  complication ([place])     Meds ordered this encounter  Medications  . metFORMIN (GLUCOPHAGE-XR) 750 MG 24 hr tablet    Sig: Take 1 tablet (750 mg total) by mouth daily with breakfast.    Dispense:  30 tablet    Refill:  5    No orders of the defined types were placed in this encounter.     Diagnoses and all orders for this visit:  Diabetes mellitus without complication ([place])  Other orders -     metFORMIN (GLUCOPHAGE-XR) 750 MG 24 hr tablet; Take 1 tablet (750 mg total) by mouth daily with breakfast.    Virtual Visit via telephone Note  I discussed the limitations, risks, security and privacy concerns of performing an evaluation and management service by telephone and the availability of in person appointments. The patient was identified with two identifiers. Pt.expressed understanding and agreed to proceed. Pt. Is at home. Dr. Livia Snellen is in his office.  Follow Up Instructions:   I discussed the assessment and treatment plan with the patient. The patient was provided an opportunity to ask questions and all were answered. The patient agreed with the plan and demonstrated an understanding of the instructions.   The patient was advised to call back or  seek an in-person evaluation if the symptoms worsen or if the condition fails to improve as anticipated.   Total minutes including chart review and phone contact time: 18   Follow up plan: Return in about 2 weeks (around 08/11/2019).  Claretta Fraise, MD [place]

## 2019-08-08 ENCOUNTER — Telehealth: Payer: Self-pay | Admitting: Family Medicine

## 2019-08-08 NOTE — Telephone Encounter (Signed)
What is the name of the medication? Meter for diabetes. Pt said a mail order company will be faxing request. She needs asap  Have you contacted your pharmacy to request a refill? yes  Which pharmacy would you like this sent to? Doesn't know name of mail order company   Patient notified that their request is being sent to the clinical staff for review and that they should receive a call once it is complete. If they do not receive a call within 24 hours they can check with their pharmacy or our office.

## 2019-08-08 NOTE — Telephone Encounter (Signed)
We will send once we receive the info needed

## 2019-08-11 ENCOUNTER — Other Ambulatory Visit: Payer: Self-pay | Admitting: *Deleted

## 2019-08-11 ENCOUNTER — Other Ambulatory Visit: Payer: Self-pay

## 2019-08-11 MED ORDER — ACCU-CHEK AVIVA PLUS VI STRP: ORAL_STRIP | 3 refills | Status: DC

## 2019-08-11 MED ORDER — ACCU-CHEK AVIVA PLUS W/DEVICE KIT: PACK | 0 refills | Status: DC

## 2019-08-11 MED ORDER — ACCU-CHEK FASTCLIX LANCETS MISC: 3 refills | Status: DC

## 2019-08-12 ENCOUNTER — Encounter: Payer: Self-pay | Admitting: Family Medicine

## 2019-08-12 ENCOUNTER — Ambulatory Visit (INDEPENDENT_AMBULATORY_CARE_PROVIDER_SITE_OTHER): Payer: Medicare HMO | Admitting: Family Medicine

## 2019-08-12 VITALS — BP 106/65 | HR 76 | Temp 98.4°F | Ht 64.0 in | Wt 210.6 lb

## 2019-08-12 DIAGNOSIS — E119 Type 2 diabetes mellitus without complications: Secondary | ICD-10-CM | POA: Diagnosis not present

## 2019-08-12 DIAGNOSIS — R7309 Other abnormal glucose: Secondary | ICD-10-CM | POA: Diagnosis not present

## 2019-08-12 LAB — BAYER DCA HB A1C WAIVED: HB A1C (BAYER DCA - WAIVED): 6.9 % (ref <7.0)

## 2019-08-12 NOTE — Progress Notes (Signed)
Subjective:  Patient ID: Kimberly Obrien, female    DOB: 1961-04-12  Age: 58 y.o. MRN: [id number]  CC: diabetes recheck   HPI CASH MEADOW presents forFollow-up of diabetes. Patient checks blood sugar at home.  Logs show that glucose has dropped from mid 200's to 300, down to 120-140 since starting metformin 2 weeks ago. Pt. Familiar with DM diet and started it already. aSays she has a hard time giving up pasta.  Patient denies symptoms such as polyuria, polydipsia, excessive hunger, nausea No significant hypoglycemic spells noted. Medications reviewed. Pt reports taking them regularly without complication/adverse reaction being reported today.    History Cynda has a past medical history of Anticoagulation monitoring by pharmacist, Arthritis, Asthma, Autoimmune hepatitis ([place]), Cataract, COPD (chronic obstructive pulmonary disease) ([place]), Dyspnea, Hyperlipidemia, PE (pulmonary embolism), and Sleep apnea.   She has a past surgical history that includes Tubal ligation; Wrist surgery (Right); Cholecystectomy; Spine surgery; Abdominal hysterectomy; Eye surgery; left and right heart catheterization with coronary angiogram (N/A, 05/13/2014); Incontinence surgery (2009); Cataract extraction, bilateral (Bilateral); Esophagogastroduodenoscopy (egd) with propofol (N/A, 05/07/2017); Colonoscopy with propofol (N/A, 05/07/2017); IR Venogram Hepatic W Hemodynamic Evaluation (03/15/2018); and IR Transcatheter BX (03/15/2018).   Her family history includes Bipolar disorder in her father; Cirrhosis in her mother; Heart failure in her father; Hyperlipidemia in her father; Hypertension in her brother; Obesity in her daughter.She reports that she quit smoking about 7 years ago. Her smoking use included cigarettes. She started smoking about 42 years ago. She has a 100.00 pack-year smoking history. She has never used smokeless tobacco. She reports that she does not drink alcohol or use drugs.  Current  Outpatient Medications on File Prior to Visit  Medication Sig Dispense Refill  . Accu-Chek FastClix Lancets MISC Check BS BID and prn Dx E11.9 200 each 3  . albuterol (PROVENTIL HFA;VENTOLIN HFA) 108 (90 BASE) MCG/ACT inhaler Inhale into the lungs every 6 (six) hours as needed for wheezing or shortness of breath.    . Blood Glucose Monitoring Suppl (ACCU-CHEK AVIVA PLUS) w/Device KIT Check BS BID and prn Dx E11.9 1 kit 0  . budesonide-formoterol (SYMBICORT) 80-4.5 MCG/ACT inhaler INHALE TWO PUFFS BY MOUTH ONCE DAILY 11 g 0  . glucose blood (ACCU-CHEK AVIVA PLUS) test strip Check BS BID and prn Dx E11.9 200 each 3  . ipratropium-albuterol (DUONEB) 0.5-2.5 (3) MG/3ML SOLN Take 3 mLs by nebulization every 6 (six) hours as needed. 360 mL 1  . metFORMIN (GLUCOPHAGE-XR) 750 MG 24 hr tablet Take 1 tablet (750 mg total) by mouth daily with breakfast. 30 tablet 5   No current facility-administered medications on file prior to visit.    ROS Review of Systems  Constitutional: Negative.   HENT: Negative.   Eyes: Negative for visual disturbance.  Respiratory: Negative for shortness of breath.   Cardiovascular: Negative for chest pain.  Gastrointestinal: Negative for abdominal pain.  Musculoskeletal: Negative for arthralgias.    Objective:  BP 106/65   Pulse 76   Temp 98.4 F (36.9 C) (Temporal)   Ht 5' 4"  (1.626 m)   Wt 210 lb 9.6 oz (95.5 kg)   SpO2 97%   BMI 36.15 kg/m   BP Readings from Last 3 Encounters:  08/12/19 106/65  07/03/19 112/67  05/22/19 120/68    Wt Readings from Last 3 Encounters:  08/12/19 210 lb 9.6 oz (95.5 kg)  07/03/19 214 lb 6.4 oz (97.3 kg)  05/22/19 217 lb 3.2 oz (98.5 kg)     Physical  Exam Constitutional:      General: She is not in acute distress.    Appearance: Normal appearance. She is well-developed.  Skin:    General: Skin is warm and dry.  Neurological:     Mental Status: She is alert and oriented to person, place, and time. Mental status is at  baseline.     Motor: No weakness.  Psychiatric:        Mood and Affect: Mood normal.        Behavior: Behavior normal.        Thought Content: Thought content normal.       Assessment & Plan:   Conor was seen today for diabetes recheck.  Diagnoses and all orders for this visit:  Diabetes mellitus without complication ([place]) -     C-peptide -     CMP14+EGFR  Elevated glucose -     hgba1c -     Microalbumin / creatinine urine ratio -     C-peptide -     CMP14+EGFR      I have discontinued Mardene Celeste B. Tidmore's predniSONE. I am also having her maintain her albuterol, budesonide-formoterol, ipratropium-albuterol, metFORMIN, Accu-Chek Aviva Plus, Accu-Chek Aviva Plus, and Accu-Chek FastClix Lancets.  No orders of the defined types were placed in this encounter.    Follow-up: Return in about 3 months (around 11/10/2019).  Claretta Fraise, M.D.

## 2019-08-12 NOTE — Patient Instructions (Signed)
Carbohydrate Counting for Diabetes Mellitus, Adult  Carbohydrate counting is a method of keeping track of how many carbohydrates you eat. Eating carbohydrates naturally increases the amount of sugar (glucose) in the blood. Counting how many carbohydrates you eat helps keep your blood glucose within normal limits, which helps you manage your diabetes (diabetes mellitus). It is important to know how many carbohydrates you can safely have in each meal. This is different for every person. A diet and nutrition specialist (registered dietitian) can help you make a meal plan and calculate how many carbohydrates you should have at each meal and snack. Carbohydrates are found in the following foods:  Grains, such as breads and cereals.  Dried beans and soy products.  Starchy vegetables, such as potatoes, peas, and corn.  Fruit and fruit juices.  Milk and yogurt.  Sweets and snack foods, such as cake, cookies, candy, chips, and soft drinks. How do I count carbohydrates? There are two ways to count carbohydrates in food. You can use either of the methods or a combination of both. Reading "Nutrition Facts" on packaged food The "Nutrition Facts" list is included on the labels of almost all packaged foods and beverages in the [place] It includes:  The serving size.  Information about nutrients in each serving, including the grams (g) of carbohydrate per serving. To use the "Nutrition Facts":  Decide how many servings you will have.  Multiply the number of servings by the number of carbohydrates per serving.  The resulting number is the total amount of carbohydrates that you will be having. Learning standard serving sizes of other foods When you eat carbohydrate foods that are not packaged or do not include "Nutrition Facts" on the label, you need to measure the servings in order to count the amount of carbohydrates:  Measure the foods that you will eat with a food scale or measuring cup, if needed.   Decide how many standard-size servings you will eat.  Multiply the number of servings by 15. Most carbohydrate-rich foods have about 15 g of carbohydrates per serving. ? For example, if you eat 8 oz (170 g) of strawberries, you will have eaten 2 servings and 30 g of carbohydrates (2 servings x 15 g = 30 g).  For foods that have more than one food mixed, such as soups and casseroles, you must count the carbohydrates in each food that is included. The following list contains standard serving sizes of common carbohydrate-rich foods. Each of these servings has about 15 g of carbohydrates:   hamburger bun or  English muffin.   oz (15 mL) syrup.   oz (14 g) jelly.  1 slice of bread.  1 six-inch tortilla.  3 oz (85 g) cooked rice or pasta.  4 oz (113 g) cooked dried beans.  4 oz (113 g) starchy vegetable, such as peas, corn, or potatoes.  4 oz (113 g) hot cereal.  4 oz (113 g) mashed potatoes or  of a large baked potato.  4 oz (113 g) canned or frozen fruit.  4 oz (120 mL) fruit juice.  4-6 crackers.  6 chicken nuggets.  6 oz (170 g) unsweetened dry cereal.  6 oz (170 g) plain fat-free yogurt or yogurt sweetened with artificial sweeteners.  8 oz (240 mL) milk.  8 oz (170 g) fresh fruit or one small piece of fruit.  24 oz (680 g) popped popcorn. Example of carbohydrate counting Sample meal  3 oz (85 g) chicken breast.  6 oz (170 g)   brown rice.  4 oz (113 g) corn.  8 oz (240 mL) milk.  8 oz (170 g) strawberries with sugar-free whipped topping. Carbohydrate calculation 1. Identify the foods that contain carbohydrates: ? Rice. ? Corn. ? Milk. ? Strawberries. 2. Calculate how many servings you have of each food: ? 2 servings rice. ? 1 serving corn. ? 1 serving milk. ? 1 serving strawberries. 3. Multiply each number of servings by 15 g: ? 2 servings rice x 15 g = 30 g. ? 1 serving corn x 15 g = 15 g. ? 1 serving milk x 15 g = 15 g. ? 1 serving  strawberries x 15 g = 15 g. 4. Add together all of the amounts to find the total grams of carbohydrates eaten: ? 30 g + 15 g + 15 g + 15 g = 75 g of carbohydrates total. Summary  Carbohydrate counting is a method of keeping track of how many carbohydrates you eat.  Eating carbohydrates naturally increases the amount of sugar (glucose) in the blood.  Counting how many carbohydrates you eat helps keep your blood glucose within normal limits, which helps you manage your diabetes.  A diet and nutrition specialist (registered dietitian) can help you make a meal plan and calculate how many carbohydrates you should have at each meal and snack. This information is not intended to replace advice given to you by your health care provider. Make sure you discuss any questions you have with your health care provider. Document Released: 08/07/2005 Document Revised: 03/01/2017 Document Reviewed: 01/19/2016 Elsevier Patient Education  2020 [employer].   Diabetes Mellitus and Exercise Exercising regularly is important for your overall health, especially when you have diabetes (diabetes mellitus). Exercising is not only about losing weight. It has many other health benefits, such as increasing muscle strength and bone density and reducing body fat and stress. This leads to improved fitness, flexibility, and endurance, all of which result in better overall health. Exercise has additional benefits for people with diabetes, including:  Reducing appetite.  Helping to lower and control blood glucose.  Lowering blood pressure.  Helping to control amounts of fatty substances (lipids) in the blood, such as cholesterol and triglycerides.  Helping the body to respond better to insulin (improving insulin sensitivity).  Reducing how much insulin the body needs.  Decreasing the risk for heart disease by: ? Lowering cholesterol and triglyceride levels. ? Increasing the levels of good cholesterol. ? Lowering  blood glucose levels. What is my activity plan? Your health care provider or certified diabetes educator can help you make a plan for the type and frequency of exercise (activity plan) that works for you. Make sure that you:  Do at least 150 minutes of moderate-intensity or vigorous-intensity exercise each week. This could be brisk walking, biking, or water aerobics. ? Do stretching and strength exercises, such as yoga or weightlifting, at least 2 times a week. ? Spread out your activity over at least 3 days of the week.  Get some form of physical activity every day. ? Do not go more than 2 days in a row without some kind of physical activity. ? Avoid being inactive for more than 30 minutes at a time. Take frequent breaks to walk or stretch.  Choose a type of exercise or activity that you enjoy, and set realistic goals.  Start slowly, and gradually increase the intensity of your exercise over time. What do I need to know about managing my diabetes?   Check your  blood glucose before and after exercising. ? If your blood glucose is 240 mg/dL (13.3 mmol/L) or higher before you exercise, check your urine for ketones. If you have ketones in your urine, do not exercise until your blood glucose returns to normal. ? If your blood glucose is 100 mg/dL (5.6 mmol/L) or lower, eat a snack containing 15-20 grams of carbohydrate. Check your blood glucose 15 minutes after the snack to make sure that your level is above 100 mg/dL (5.6 mmol/L) before you start your exercise.  Know the symptoms of low blood glucose (hypoglycemia) and how to treat it. Your risk for hypoglycemia increases during and after exercise. Common symptoms of hypoglycemia can include: ? Hunger. ? Anxiety. ? Sweating and feeling clammy. ? Confusion. ? Dizziness or feeling light-headed. ? Increased heart rate or palpitations. ? Blurry vision. ? Tingling or numbness around the mouth, lips, or tongue. ? Tremors or shakes. ?  Irritability.  Keep a rapid-acting carbohydrate snack available before, during, and after exercise to help prevent or treat hypoglycemia.  Avoid injecting insulin into areas of the body that are going to be exercised. For example, avoid injecting insulin into: ? The arms, when playing tennis. ? The legs, when jogging.  Keep records of your exercise habits. Doing this can help you and your health care provider adjust your diabetes management plan as needed. Write down: ? Food that you eat before and after you exercise. ? Blood glucose levels before and after you exercise. ? The type and amount of exercise you have done. ? When your insulin is expected to peak, if you use insulin. Avoid exercising at times when your insulin is peaking.  When you start a new exercise or activity, work with your health care provider to make sure the activity is safe for you, and to adjust your insulin, medicines, or food intake as needed.  Drink plenty of water while you exercise to prevent dehydration or heat stroke. Drink enough fluid to keep your urine clear or pale yellow. Summary  Exercising regularly is important for your overall health, especially when you have diabetes (diabetes mellitus).  Exercising has many health benefits, such as increasing muscle strength and bone density and reducing body fat and stress.  Your health care provider or certified diabetes educator can help you make a plan for the type and frequency of exercise (activity plan) that works for you.  When you start a new exercise or activity, work with your health care provider to make sure the activity is safe for you, and to adjust your insulin, medicines, or food intake as needed. This information is not intended to replace advice given to you by your health care provider. Make sure you discuss any questions you have with your health care provider. Document Released: 10/28/2003 Document Revised: 03/01/2017 Document Reviewed:  01/17/2016 Elsevier Patient Education  2020 [employer].

## 2019-08-13 ENCOUNTER — Telehealth: Payer: Self-pay | Admitting: Family Medicine

## 2019-08-13 LAB — CMP14+EGFR
ALT: 59 IU/L — ABNORMAL HIGH (ref 0–32)
AST: 56 IU/L — ABNORMAL HIGH (ref 0–40)
Albumin/Globulin Ratio: 1.9 (ref 1.2–2.2)
Albumin: 3.7 g/dL — ABNORMAL LOW (ref 3.8–4.9)
Alkaline Phosphatase: 119 IU/L — ABNORMAL HIGH (ref 39–117)
BUN/Creatinine Ratio: 11 (ref 9–23)
BUN: 6 mg/dL (ref 6–24)
Bilirubin Total: 2.6 mg/dL — ABNORMAL HIGH (ref 0.0–1.2)
CO2: 24 mmol/L (ref 20–29)
Calcium: 8.6 mg/dL — ABNORMAL LOW (ref 8.7–10.2)
Chloride: 107 mmol/L — ABNORMAL HIGH (ref 96–106)
Creatinine, Ser: 0.57 mg/dL (ref 0.57–1.00)
GFR calc Af Amer: 118 mL/min/1.73
GFR calc non Af Amer: 103 mL/min/1.73
Globulin, Total: 2 g/dL (ref 1.5–4.5)
Glucose: 109 mg/dL — ABNORMAL HIGH (ref 65–99)
Potassium: 3.9 mmol/L (ref 3.5–5.2)
Sodium: 143 mmol/L (ref 134–144)
Total Protein: 5.7 g/dL — ABNORMAL LOW (ref 6.0–8.5)

## 2019-08-13 LAB — C-PEPTIDE: C-Peptide: 5 ng/mL — ABNORMAL HIGH (ref 1.1–4.4)

## 2019-08-13 LAB — MICROALBUMIN / CREATININE URINE RATIO
Creatinine, Urine: 118.3 mg/dL
Microalb/Creat Ratio: 6 mg/g{creat} (ref 0–29)
Microalbumin, Urine: 7.5 ug/mL

## 2019-08-18 ENCOUNTER — Other Ambulatory Visit: Payer: Self-pay | Admitting: Family Medicine

## 2019-08-18 MED ORDER — METFORMIN HCL ER 750 MG PO TB24: 750.0000 mg | ORAL_TABLET | Freq: Two times a day (BID) | ORAL | 5 refills | Status: DC

## 2019-08-18 NOTE — Telephone Encounter (Signed)
Left message stating requested rx was sent to the pharmacy and to call back with any questions or concerns.

## 2019-08-18 NOTE — Telephone Encounter (Signed)
I sent in the requested prescription 

## 2019-08-19 ENCOUNTER — Other Ambulatory Visit: Payer: Self-pay | Admitting: *Deleted

## 2019-08-19 MED ORDER — ACCU-CHEK AVIVA PLUS W/DEVICE KIT: PACK | 0 refills | Status: DC

## 2019-08-19 MED ORDER — ACCU-CHEK SOFTCLIX LANCETS MISC: 3 refills | Status: DC

## 2019-08-19 MED ORDER — ACCU-CHEK AVIVA PLUS VI STRP: ORAL_STRIP | 3 refills | Status: DC

## 2019-08-26 DIAGNOSIS — K7581 Nonalcoholic steatohepatitis (NASH): Secondary | ICD-10-CM | POA: Diagnosis not present

## 2019-08-26 DIAGNOSIS — K7460 Unspecified cirrhosis of liver: Secondary | ICD-10-CM | POA: Diagnosis not present

## 2019-08-26 DIAGNOSIS — E785 Hyperlipidemia, unspecified: Secondary | ICD-10-CM | POA: Diagnosis not present

## 2019-08-27 DIAGNOSIS — J449 Chronic obstructive pulmonary disease, unspecified: Secondary | ICD-10-CM | POA: Diagnosis not present

## 2019-09-15 ENCOUNTER — Inpatient Hospital Stay: Payer: Medicare Other | Admitting: Hematology

## 2019-09-15 ENCOUNTER — Inpatient Hospital Stay: Payer: Medicare Other | Attending: Hematology

## 2019-09-15 ENCOUNTER — Other Ambulatory Visit: Payer: Self-pay

## 2019-09-15 VITALS — BP 113/60 | HR 76 | Temp 98.5°F | Resp 18 | Ht 64.0 in | Wt 204.0 lb

## 2019-09-15 DIAGNOSIS — D696 Thrombocytopenia, unspecified: Secondary | ICD-10-CM | POA: Diagnosis not present

## 2019-09-15 DIAGNOSIS — E785 Hyperlipidemia, unspecified: Secondary | ICD-10-CM | POA: Insufficient documentation

## 2019-09-15 DIAGNOSIS — D508 Other iron deficiency anemias: Secondary | ICD-10-CM | POA: Diagnosis not present

## 2019-09-15 DIAGNOSIS — J449 Chronic obstructive pulmonary disease, unspecified: Secondary | ICD-10-CM | POA: Diagnosis not present

## 2019-09-15 DIAGNOSIS — G4730 Sleep apnea, unspecified: Secondary | ICD-10-CM | POA: Insufficient documentation

## 2019-09-15 DIAGNOSIS — K754 Autoimmune hepatitis: Secondary | ICD-10-CM | POA: Diagnosis not present

## 2019-09-15 DIAGNOSIS — Z87891 Personal history of nicotine dependence: Secondary | ICD-10-CM | POA: Diagnosis not present

## 2019-09-15 DIAGNOSIS — D500 Iron deficiency anemia secondary to blood loss (chronic): Secondary | ICD-10-CM

## 2019-09-15 DIAGNOSIS — Z90710 Acquired absence of both cervix and uterus: Secondary | ICD-10-CM | POA: Insufficient documentation

## 2019-09-15 DIAGNOSIS — Z79899 Other long term (current) drug therapy: Secondary | ICD-10-CM | POA: Diagnosis not present

## 2019-09-15 DIAGNOSIS — Z7951 Long term (current) use of inhaled steroids: Secondary | ICD-10-CM | POA: Insufficient documentation

## 2019-09-15 DIAGNOSIS — Z86711 Personal history of pulmonary embolism: Secondary | ICD-10-CM | POA: Insufficient documentation

## 2019-09-15 LAB — CBC WITH DIFFERENTIAL/PLATELET
Abs Immature Granulocytes: 0 10*3/uL (ref 0.00–0.07)
Basophils Absolute: 0 10*3/uL (ref 0.0–0.1)
Basophils Relative: 1 %
Eosinophils Absolute: 0.1 10*3/uL (ref 0.0–0.5)
Eosinophils Relative: 2 %
HCT: 41.4 % (ref 36.0–46.0)
Hemoglobin: 14.3 g/dL (ref 12.0–15.0)
Immature Granulocytes: 0 %
Lymphocytes Relative: 24 %
Lymphs Abs: 1.1 10*3/uL (ref 0.7–4.0)
MCH: 29.9 pg (ref 26.0–34.0)
MCHC: 34.5 g/dL (ref 30.0–36.0)
MCV: 86.6 fL (ref 80.0–100.0)
Monocytes Absolute: 0.3 10*3/uL (ref 0.1–1.0)
Monocytes Relative: 6 %
Neutro Abs: 3.3 10*3/uL (ref 1.7–7.7)
Neutrophils Relative %: 67 %
Platelets: 93 10*3/uL — ABNORMAL LOW (ref 150–400)
RBC: 4.78 MIL/uL (ref 3.87–5.11)
RDW: 13.7 % (ref 11.5–15.5)
WBC: 4.8 10*3/uL (ref 4.0–10.5)
nRBC: 0 % (ref 0.0–0.2)

## 2019-09-15 LAB — CMP (CANCER CENTER ONLY)
ALT: 37 U/L (ref 0–44)
AST: 41 U/L (ref 15–41)
Albumin: 3.6 g/dL (ref 3.5–5.0)
Alkaline Phosphatase: 89 U/L (ref 38–126)
Anion gap: 9 (ref 5–15)
BUN: 9 mg/dL (ref 6–20)
CO2: 25 mmol/L (ref 22–32)
Calcium: 8.5 mg/dL — ABNORMAL LOW (ref 8.9–10.3)
Chloride: 109 mmol/L (ref 98–111)
Creatinine: 0.59 mg/dL (ref 0.44–1.00)
GFR, Est AFR Am: >60 mL/min (ref >60)
GFR, Estimated: >60 mL/min (ref >60)
Glucose, Bld: 110 mg/dL — ABNORMAL HIGH (ref 70–99)
Potassium: 3.6 mmol/L (ref 3.5–5.1)
Sodium: 143 mmol/L (ref 135–145)
Total Bilirubin: 2.9 mg/dL — ABNORMAL HIGH (ref 0.3–1.2)
Total Protein: 6.1 g/dL — ABNORMAL LOW (ref 6.5–8.1)

## 2019-09-15 LAB — FERRITIN: Ferritin: 43 ng/mL (ref 11–307)

## 2019-09-15 NOTE — Progress Notes (Signed)
HEMATOLOGY CLINIC NOTE Patient Care Team: Claretta Fraise, MD as PCP - General (Family Medicine) [name], MD as PCP - Cardiology (Cardiology) Sinda Du, MD as Consulting Physician (Pulmonary Disease) Teena Irani, MD (Inactive) as Consulting Physician (Gastroenterology) [name], MD as Consulting Physician (Cardiology)    CHIEF COMPLAINTS/PURPOSE OF CONSULTATION:  F/u for IDA  HISTORY OF PRESENTING ILLNESS:   Kimberly Obrien 59 y.o. female is here because of a referral from Dr. 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   The pt reports that she is doing well overall. She reports seeing a Hematologist first after having a PE on her right side, damaging the lower lobe in 1999. She reports having a second in 2003 with 3 small PE in her right upper lobe. She reports having been on estrogen containing birth control in 1999 which she was told likely led to her PE, and was smoking at the time. She reports taking a diet pill with hormones in 2003 when she had her second PE. She denies having genetic testing regarding these Pes. She reports taking Warfarin 6 months after the first PE. She reports having taken Coumadin from 2003 until 3 months ago when she stopped due to her GI bleeding.  She denies having had any other blood clots, through 4 pregnancies and a few surgeries.  She denies a FHx of blood clotting or bleeding problems. She reports being SOB when she is losing lots of blood. She reports having a blood transfusion on 05/07/17, and receiving two iron infusions in the last 4 months. She denies allergic reactions to IV iron, but does note stomach issues and drowsiness for a couple days following IV iron.   Dr. Oletta Lamas has been following her iron levels. She reports using an inhaler.  She reports that Dr. 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   Of note prior to the patient's visit, pt has had an Abdomen CT completed on 04/19/17 with results revealing Mild splenomegaly.  On 05/07/17 she had a colonoscopy with 4 polyps removed, and non-bleeding internal hemorrhoids and upper endoscopy (05/07/17) with Gastritis. Biopsied. Normal examined duodenum. Blood found in stool.  Most recent lab results (08/30/17) of CBC, CMP is as follows: all values are WNL except for Glucose at 128, Total bilirubin at 1.4, AST at 68, ALT at 64, MCH at 26.4, Plt at 136k, Neut % at 75.5%.   On review of systems, pt reports occasional SOB, blood in the stools, abdominal pains, and denies mouth ulcers, and any other symptoms.   On PMHx the pt reports IBS, Positive hemoccult card, RRR with murmur, abdominal pain, respiratory disease, migraines, vertigo, COPD, arthritis, DJD, abnormal heart rhythm, fatty liver, pulmonary embolism, pulmonary edema, tachycardia, Vit D deficiency, arthralgia, insomnia, fatigue, hyperlipidemia, anxiety, dysuria, and sleep apnea.  On Surgical Hx the pt reports back surgery x4 (1997, 2006, 2006, 2007), tubal ligation (1985), wrist surgery x2 (1995 x2), cholecystectomy (2001), colonoscopy x2 (2007, 2014), EGD x2 (2002, 2017), hysterectomy partial (2009), rt eye cataract (2013), and cardiac catheterization (2015). On Social Hx the pt reports quitting smoking in 04/18/12 after 30 years, no ETOH use, and being occupationally disabled.    INTERVAL HISTORY   SAMMIE SCHERMERHORN is here for management and evaluation of her iron deficiency anemia. The patient's last visit with [place] was on 01/24/2019. The pt reports that she is doing well  overall.  The pt reports that she is still having black/bloody stools about once per month. She was having some pain in her pancreas in October but did not get it evaluated at the time. Her GI, Dr. Oletta Lamas, has retired and she has not set up care with another GI. She has  followed up with [facility] twice for evaluation of her liver disease. Pt has occasional acid reflux and notices it more with certain foods, but it is not usually bothersome. She has also noticed some dark colored urine. Pt has continued to stay off of anticoagulation. Pt was recently diagnosed with Diabetes and has been working on losing weight.   Lab results today (09/15/19) of CBC w/diff and CMP is as follows: all values are WNL except for PLT at 93K, Glucose at 110, Calcium at 8.5, Total Protein at 6.1, Total Bilirubin at 2.9. 09/15/2019 Ferritin at 43  On review of systems, pt reports bloody/black stools, dark urine, abdominal discomfort, acid reflux and denies unexpected weight loss and any other symptoms.   MEDICAL HISTORY:  Past Medical History:  Diagnosis Date  . Anticoagulation monitoring by pharmacist   . Arthritis   . Asthma   . Autoimmune hepatitis ([place])   . Cataract   . COPD (chronic obstructive pulmonary disease) ([place])   . Dyspnea    exertion  . Hyperlipidemia   . PE (pulmonary embolism)   . Sleep apnea     SURGICAL HISTORY: Past Surgical History:  Procedure Laterality Date  . ABDOMINAL HYSTERECTOMY    . CATARACT EXTRACTION, BILATERAL Bilateral   . CHOLECYSTECTOMY    . COLONOSCOPY WITH PROPOFOL N/A 05/07/2017   Procedure: COLONOSCOPY WITH PROPOFOL;  Surgeon: Laurence Spates, MD;  Location: [facility];  Service: Endoscopy;  Laterality: N/A;  . ESOPHAGOGASTRODUODENOSCOPY (EGD) WITH PROPOFOL N/A 05/07/2017   Procedure: ESOPHAGOGASTRODUODENOSCOPY (EGD) WITH PROPOFOL;  Surgeon: Laurence Spates, MD;  Location: [facility];  Service: Endoscopy;  Laterality: N/A;  . EYE SURGERY     Cataracts  . INCONTINENCE SURGERY  2009  . IR TRANSCATHETER BX  03/15/2018  . IR VENOGRAM HEPATIC W HEMODYNAMIC EVALUATION  03/15/2018  . LEFT AND RIGHT HEART CATHETERIZATION WITH CORONARY ANGIOGRAM N/A 05/13/2014   Procedure: LEFT AND RIGHT HEART CATHETERIZATION WITH CORONARY ANGIOGRAM;  Surgeon:  [name], MD;  Location: [facility] CATH LAB;  Service: Cardiovascular;  Laterality: N/A;  . SPINE SURGERY     L5 S1 fusion  . TUBAL LIGATION    . WRIST SURGERY Right    Had surgery twice to shave bone for blood circulation improvement.    SOCIAL HISTORY: Social History   Socioeconomic History  . Marital status: Married    Spouse name: Not on file  . Number of children: 4  . Years of education: GED  . Highest education level: GED or equivalent  Occupational History  . Occupation: Unemployed  Tobacco Use  . Smoking status: Former Smoker    Packs/day: 2.00    Years: 50.00    Pack years: 100.00    Types: Cigarettes    Start date: 08/22/1977    Quit date: 04/21/2012    Years since quitting: 7.4  . Smokeless tobacco: Never Used  Substance and Sexual Activity  . Alcohol use: No  . Drug use: No  . Sexual activity: Yes  Other Topics Concern  . Not on file  Social History Narrative  . Not on file   Social Determinants of Health   Financial Resource Strain:   . Difficulty of  Paying Living Expenses: Not on file  Food Insecurity:   . Worried About Charity fundraiser in the Last Year: Not on file  . Ran Out of Food in the Last Year: Not on file  Transportation Needs:   . Lack of Transportation (Medical): Not on file  . Lack of Transportation (Non-Medical): Not on file  Physical Activity:   . Days of Exercise per Week: Not on file  . Minutes of Exercise per Session: Not on file  Stress:   . Feeling of Stress : Not on file  Social Connections:   . Frequency of Communication with Friends and Family: Not on file  . Frequency of Social Gatherings with Friends and Family: Not on file  . Attends Religious Services: Not on file  . Active Member of Clubs or Organizations: Not on file  . Attends Archivist Meetings: Not on file  . Marital Status: Not on file  Intimate Partner Violence:   . Fear of Current or Ex-Partner: Not on file  . Emotionally Abused: Not on  file  . Physically Abused: Not on file  . Sexually Abused: Not on file    FAMILY HISTORY: Family History  Problem Relation Age of Onset  . Cirrhosis Mother   . Hyperlipidemia Father   . Heart failure Father   . Bipolar disorder Father   . Obesity Daughter   . Hypertension Brother   . Heart attack Neg Hx   . Miscarriages / Stillbirths Neg Hx     ALLERGIES:  is allergic to aciphex [rabeprazole sodium]; fluconazole; lansoprazole; sucralfate; vancomycin; claritin [loratadine]; crestor [rosuvastatin]; doxycycline; ibuprofen; lipitor [atorvastatin]; macrobid [nitrofurantoin macrocrystal]; metoprolol; metronidazole; nsaids; omeprazole; rabeprazole; and septra [sulfamethoxazole-trimethoprim].  MEDICATIONS:  Current Outpatient Medications  Medication Sig Dispense Refill  . Accu-Chek FastClix Lancets MISC Check BS BID and prn Dx E11.9 200 each 3  . Accu-Chek Softclix Lancets lancets Test BS BID and prn Dx E11.9 200 each 3  . albuterol (PROVENTIL HFA;VENTOLIN HFA) 108 (90 BASE) MCG/ACT inhaler Inhale into the lungs every 6 (six) hours as needed for wheezing or shortness of breath.    . Blood Glucose Monitoring Suppl (ACCU-CHEK AVIVA PLUS) w/Device KIT Check BS BID and prn Dx E11.9 1 kit 0  . budesonide-formoterol (SYMBICORT) 80-4.5 MCG/ACT inhaler INHALE TWO PUFFS BY MOUTH ONCE DAILY 11 g 0  . glucose blood (ACCU-CHEK AVIVA PLUS) test strip Check BS BID and prn Dx E11.9 200 each 3  . ipratropium-albuterol (DUONEB) 0.5-2.5 (3) MG/3ML SOLN Take 3 mLs by nebulization every 6 (six) hours as needed. 360 mL 1  . metFORMIN (GLUCOPHAGE-XR) 750 MG 24 hr tablet Take 1 tablet (750 mg total) by mouth 2 (two) times daily. 60 tablet 5   No current facility-administered medications for this visit.    REVIEW OF SYSTEMS:  A 10+ POINT REVIEW OF SYSTEMS WAS OBTAINED including neurology, dermatology, psychiatry, cardiac, respiratory, lymph, extremities, GI, GU, Musculoskeletal, constitutional, breasts,  reproductive, HEENT.  All pertinent positives are noted in the HPI.  All others are negative.   PHYSICAL EXAMINATION:  Vitals:   09/15/19 1055  BP: 113/60  Pulse: 76  Resp: 18  Temp: 98.5 F (36.9 C)  SpO2: 95%   Filed Weights   09/15/19 1055  Weight: 204 lb (92.5 kg)    GENERAL:alert, in no acute distress and comfortable SKIN: no acute rashes, no significant lesions EYES: conjunctiva are pink and non-injected, sclera anicteric OROPHARYNX: MMM, no exudates, no oropharyngeal erythema or ulceration NECK: supple, no  JVD LYMPH:  no palpable lymphadenopathy in the cervical, axillary or inguinal regions LUNGS: clear to auscultation b/l with normal respiratory effort HEART: regular rate & rhythm ABDOMEN:  normoactive bowel sounds , non tender, not distended. No palpable hepatosplenomegaly.  Extremity: no pedal edema PSYCH: alert & oriented x 3 with fluent speech NEURO: no focal motor/sensory deficits  LABORATORY DATA:  I have reviewed the data as listed Recent Results (from the past 2160 hour(s))  hgba1c     Status: None   Collection Time: 08/12/19  2:00 PM  Result Value Ref Range   HB A1C (BAYER DCA - WAIVED) 6.9 <7.0 %    Comment:                                       Diabetic Adult            <7.0                                       Healthy Adult        4.3 - 5.7                                                           (DCCT/NGSP) American Diabetes Association's Summary of Glycemic Recommendations for Adults with Diabetes: Hemoglobin A1c <7.0%. More stringent glycemic goals (A1c <6.0%) may further reduce complications at the cost of increased risk of hypoglycemia.   Microalbumin / creatinine urine ratio     Status: None   Collection Time: 08/12/19  2:09 PM  Result Value Ref Range   Creatinine, Urine 118.3 Not Estab. mg/dL   Microalbumin, Urine 7.5 Not Estab. ug/mL   Microalb/Creat Ratio 6 0 - 29 mg/g creat    Comment:                        Normal:                0  -  29                        Moderately increased: 30 - 300                        Severely increased:       >300   C-peptide     Status: Abnormal   Collection Time: 08/12/19  2:52 PM  Result Value Ref Range   C-Peptide 5.0 (H) 1.1 - 4.4 ng/mL    Comment: C-Peptide reference interval is for fasting patients.  CMP14+EGFR     Status: Abnormal   Collection Time: 08/12/19  2:52 PM  Result Value Ref Range   Glucose 109 (H) 65 - 99 mg/dL   BUN 6 6 - 24 mg/dL   Creatinine, Ser 0.57 0.57 - 1.00 mg/dL   GFR calc non Af Amer 103 >59 mL/min/1.73   GFR calc Af Amer 118 >59 mL/min/1.73   BUN/Creatinine Ratio 11 9 - 23   Sodium 143 134 - 144 mmol/L   Potassium 3.9 3.5 - 5.2 mmol/L  Chloride 107 (H) 96 - 106 mmol/L   CO2 24 20 - 29 mmol/L   Calcium 8.6 (L) 8.7 - 10.2 mg/dL   Total Protein 5.7 (L) 6.0 - 8.5 g/dL   Albumin 3.7 (L) 3.8 - 4.9 g/dL   Globulin, Total 2.0 1.5 - 4.5 g/dL   Albumin/Globulin Ratio 1.9 1.2 - 2.2   Bilirubin Total 2.6 (H) 0.0 - 1.2 mg/dL   Alkaline Phosphatase 119 (H) 39 - 117 IU/L   AST 56 (H) 0 - 40 IU/L   ALT 59 (H) 0 - 32 IU/L  Ferritin     Status: None   Collection Time: 09/15/19 10:36 AM  Result Value Ref Range   Ferritin 43 11 - 307 ng/mL    Comment: Performed at [facility], [place] [address] [place], [place] [address]  CMP ([place] only)     Status: Abnormal   Collection Time: 09/15/19 10:36 AM  Result Value Ref Range   Sodium 143 135 - 145 mmol/L   Potassium 3.6 3.5 - 5.1 mmol/L   Chloride 109 98 - 111 mmol/L   CO2 25 22 - 32 mmol/L   Glucose, Bld 110 (H) 70 - 99 mg/dL   BUN 9 6 - 20 mg/dL   Creatinine 0.59 0.44 - 1.00 mg/dL   Calcium 8.5 (L) 8.9 - 10.3 mg/dL   Total Protein 6.1 (L) 6.5 - 8.1 g/dL   Albumin 3.6 3.5 - 5.0 g/dL   AST 41 15 - 41 U/L   ALT 37 0 - 44 U/L   Alkaline Phosphatase 89 38 - 126 U/L   Total Bilirubin 2.9 (H) 0.3 - 1.2 mg/dL   GFR, Est Non Af Am >60 >60 mL/min   GFR, Est AFR Am >60 >60 mL/min    Anion gap 9 5 - 15    Comment: Performed at [facility], [place] [address], [place], [place] [address]  CBC with Differential/Platelet     Status: Abnormal   Collection Time: 09/15/19 10:36 AM  Result Value Ref Range   WBC 4.8 4.0 - 10.5 K/uL   RBC 4.78 3.87 - 5.11 MIL/uL   Hemoglobin 14.3 12.0 - 15.0 g/dL   HCT 41.4 36.0 - 46.0 %   MCV 86.6 80.0 - 100.0 fL   MCH 29.9 26.0 - 34.0 pg   MCHC 34.5 30.0 - 36.0 g/dL   RDW 13.7 11.5 - 15.5 %   Platelets 93 (L) 150 - 400 K/uL   nRBC 0.0 0.0 - 0.2 %   Neutrophils Relative % 67 %   Neutro Abs 3.3 1.7 - 7.7 K/uL   Lymphocytes Relative 24 %   Lymphs Abs 1.1 0.7 - 4.0 K/uL   Monocytes Relative 6 %   Monocytes Absolute 0.3 0.1 - 1.0 K/uL   Eosinophils Relative 2 %   Eosinophils Absolute 0.1 0.0 - 0.5 K/uL   Basophils Relative 1 %   Basophils Absolute 0.0 0.0 - 0.1 K/uL   Immature Granulocytes 0 %   Abs Immature Granulocytes 0.00 0.00 - 0.07 K/uL    Comment: Performed at [facility], [place] [address], [place], [place] [address]   . Lab Results  Component Value Date   IRON 179 (H) 04/11/2019   TIBC 286 04/11/2019   IRONPCTSAT 63 (H) 04/11/2019   (Iron and TIBC)  Lab Results  Component Value Date   FERRITIN 43 09/15/2019   . CBC Latest Ref Rng & Units 09/15/2019 01/24/2019  10/08/2018  WBC 4.0 - 10.5 K/uL 4.8 4.7 6.3  Hemoglobin 12.0 - 15.0 g/dL 14.3 12.1 13.2  Hematocrit 36.0 - 46.0 % 41.4 37.9 39.3  Platelets 150 - 400 K/uL 93(L) 92(L) 114(L)   . CMP Latest Ref Rng & Units 09/15/2019 08/12/2019 04/17/2019  Glucose 70 - 99 mg/dL 110(H) 109(H) 137(H)  BUN 6 - 20 mg/dL _0 Creatinine 0.44 - 1.00 mg/dL 0.59 0.57 0.59  Sodium 135 - 145 mmol/L 143 143 143  Potassium 3.5 - 5.1 mmol/L 3.6 3.9 3.7  Chloride 98 - 111 mmol/L 109 107(H) 106  CO2 22 - 32 mmol/L _1 Calcium 8.9 - 10.3 mg/dL 8.5(L) 8.6(L) 8.6(L)  Total Protein 6.5 - 8.1 g/dL 6.1(L) 5.7(L) 5.6(L)  Total Bilirubin 0.3 -  1.2 mg/dL 2.9(H) 2.6(H) 2.5(H)  Alkaline Phos 38 - 126 U/L 89 119(H) 153(H)  AST 15 - 41 U/L 41 56(H) 73(H)  ALT 0 - 44 U/L 37 59(H) 58(H)      Diagnosis 05/07/17 1. Stomach, biopsy, antrum - REACTIVE GASTROPATHY. - THERE IS NO EVIDENCE OF HELICOBACTER PYLORI, DYSPLASIA OR MALIGNANCY. - SEE COMMENT. 2. Colon, polyp(s), cecal x 2 - TUBULAR ADENOMA(S). - HIGH GRADE DYSPLASIA IS NOT IDENTIFIED. 3. Colon, polyp(s), descending x 2 - TUBULAR ADENOMA(S). - HIGH GRADE DYSPLASIA IS NOT IDENTIFIED. Microscopic Comment 1. This pattern can be associated with non-steroidal anti-inflammatory drugs (NSAIDS), alcohol or with other causes of chemical gastropathy. Helicobacter pylori are not identified with Warthin-Starry stain.   ASSESSMENT & PLAN:   59 y.o. female with   1. History of recurrent Pulmonary Embolisms Her previous events appears to have been triggered by taking estrogen containing hormones and smoking. No overt Fhx of thrombophilias and no previous VTE with several pregnancies and surgeries - suggest against significant hereditary thrombophilia. Factor V Leiden was negative, Prothrombin gene mutation neg, APLA Ab panel - unrevealing.  PLAN:  -patient continues to have issues with GI bleeding and is off blood thinners as a result -no clear justification for restarting anticoagulation till her GI bleeding is completely control and then she could be considered for prophylactic ASA.  2. GI bleeding -recent w/u done -05/07/17 Upper Endoscopy and Colonoscopy show gastritis with 4 large colon polyps which were removed which were found to be tubular adenomas.    3. Autoimmune hepatitis 04/08/18 US Abdomen revealed Hepatic steatosis without other significant abnormality. -continue f/u with Dr Oletta Lamas for further evaluation and mx of GI bleeding.  4. Iron deficiency Anemia related to GI bleeding. Has needed previous IV Iron and PRBC transfusions. . 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 Goal for Ferritin >100. -Will continue to monitor Hgb and Iron levels every 6 months -Recommended pt f/u with Dr. Gerald Dexter at [facility] for liver evaluation -Recommended pt set up care with GI -Will give IV Injectater weekly x2 -Will see back in 6 months with labs   FOLLOW UP: RTC with Dr Irene Limbo with labs in 6 months   The total time spent in the  appt was 20 minutes and more than 50% was on counseling and direct patient cares.  All of the patient's questions were answered with apparent satisfaction. The patient knows to call the clinic with any problems, questions or concerns.   Sullivan Lone MD [place] AAHIVMS [facility] Hematology/Oncology Physician [facility]  (Office):       [phone] (Work cell):  [phone] (Fax):           [phone]  I, Yevette Edwards, am acting as a scribe for Dr. Sullivan Lone.   .I have reviewed the above documentation for accuracy and completeness, and I agree with the above. Brunetta Genera MD

## 2019-09-16 ENCOUNTER — Telehealth: Payer: Self-pay | Admitting: Hematology

## 2019-09-16 NOTE — Telephone Encounter (Signed)
Scheduled per 01/25 los, called patient and left a voicemail.

## 2019-09-23 ENCOUNTER — Telehealth: Payer: Self-pay | Admitting: *Deleted

## 2019-09-23 ENCOUNTER — Other Ambulatory Visit: Payer: Self-pay | Admitting: *Deleted

## 2019-09-23 MED ORDER — ACCU-CHEK GUIDE W/DEVICE KIT: 1.0000 | PACK | Freq: Two times a day (BID) | 0 refills | Status: DC

## 2019-09-23 MED ORDER — ACCU-CHEK GUIDE VI STRP: ORAL_STRIP | 3 refills | Status: DC

## 2019-09-23 NOTE — Telephone Encounter (Addendum)
Patient returned call, gave test results per Dr. Grier Mitts directions below. Patient verbalized understanding of results and recommendation. She is in agreement with his plan.   ----- Message from Rolland Bimler, RN sent at 09/23/2019  8:34 AM EST ----- Regarding: LVM - no answer Contacted patient - no answer - LVM to contact office for result information ----- Message ----- From: Brunetta Genera, MD Sent: 09/21/2019  10:50 PM EST To: Rolland Bimler, RN  Sandi, could you plz let patient know her ferritin is down to 43 due to likely continued GI losses. Would recommend 2 additional weekly doses of IV INjectafer . If patient agreeable can schedule these. Orders are in. thx

## 2019-09-24 ENCOUNTER — Telehealth: Payer: Self-pay | Admitting: Hematology

## 2019-09-24 NOTE — Telephone Encounter (Signed)
Scheduled appt per 2/2 sch message - pt aware of appt date and time

## 2019-09-25 ENCOUNTER — Other Ambulatory Visit: Payer: Self-pay | Admitting: *Deleted

## 2019-09-25 ENCOUNTER — Ambulatory Visit (INDEPENDENT_AMBULATORY_CARE_PROVIDER_SITE_OTHER): Payer: Medicare Other | Admitting: *Deleted

## 2019-09-25 DIAGNOSIS — Z0000 Encounter for general adult medical examination without abnormal findings: Secondary | ICD-10-CM | POA: Diagnosis not present

## 2019-09-25 MED ORDER — ACCU-CHEK SOFTCLIX LANCETS MISC: 3 refills | Status: DC

## 2019-09-25 NOTE — Progress Notes (Signed)
MEDICARE ANNUAL WELLNESS VISIT  09/25/2019  Telephone Visit Disclaimer This Medicare AWV was conducted by telephone due to national recommendations for restrictions regarding the COVID-19 Pandemic (e.g. social distancing).  I verified, using two identifiers, that I am speaking with Kimberly Obrien or their authorized healthcare agent. I discussed the limitations, risks, security, and privacy concerns of performing an evaluation and management service by telephone and the potential availability of an in-person appointment in the future. The patient expressed understanding and agreed to proceed.   Subjective:  Kimberly Obrien is a 59 y.o. female patient of Stacks, Cletus Gash, MD who had a Medicare Annual Wellness Visit today via telephone. Kimberly Obrien is Disabled and lives with their spouse. she has 4 children. she reports that she is socially active and does interact with friends/family regularly. she is minimally physically active and enjoys watching her grandchildren play ball, playing games on her computer and doing word search puzzles.  Patient Care Team: Claretta Fraise, MD as PCP - General (Family Medicine) [name], MD as PCP - Cardiology (Cardiology) Sinda Du, MD as Consulting Physician (Pulmonary Disease) Teena Irani, MD (Inactive) as Consulting Physician (Gastroenterology) [name], MD as Consulting Physician (Cardiology)  Advanced Directives 09/25/2019 07/22/2018 03/22/2018 03/15/2018 11/27/2017 05/07/2017 08/09/2016  Does Patient Have a Medical Advance Directive? No No No No No No No  Would patient like information on creating a medical advance directive? No - Patient declined No - Patient declined No - Patient declined No - Patient declined No - Patient declined No - Patient declined Yes (MAU/Ambulatory/Procedural Areas - Information given)    Hospital Utilization Over the Past 12 Months: # of hospitalizations or ER visits: 0 # of surgeries:  0  Review of Systems    Patient reports that her overall health is unchanged compared to last year.  History obtained from chart review  Patient Reported Readings (BP, Pulse, CBG, Weight, etc) none  Pain Assessment Pain : 0-10 Pain Score: 4  Pain Type: Chronic pain Pain Location: Shoulder Pain Descriptors / Indicators: Shooting, Throbbing Pain Onset: More than a month ago Pain Frequency: Intermittent Pain Relieving Factors: Icy Hot, rest Effect of Pain on Daily Activities: Has to take frequent rest breaks-right shoulder pain makes it hard to pick things up  Pain Relieving Factors: Icy Hot, rest  Current Medications & Allergies (verified) Allergies as of 09/25/2019      Reactions   Aciphex [rabeprazole Sodium] Rash   Fluconazole Rash, Hives   Lansoprazole Hives   Sucralfate Nausea Only   Vancomycin Diarrhea   Claritin [loratadine] Other (See Comments)   Tired   Crestor [rosuvastatin] Other (See Comments)   Elevated blood glucose and abdominal pain   Doxycycline Rash   Ibuprofen Other (See Comments)   Upset stomach   Lipitor [atorvastatin] Other (See Comments)   Myalgias, leg pain.  Problems with pancreas, sugars went up also.   Macrobid [nitrofurantoin Macrocrystal] Nausea And Vomiting   Metoprolol Nausea And Vomiting   Metronidazole Other (See Comments), Nausea Only   Dizziness, nausea, dry mouth and some shortness of breath   Nsaids Other (See Comments)   Upset stomach   Omeprazole    bloating   Rabeprazole Rash   Septra [sulfamethoxazole-trimethoprim] Nausea And Vomiting      Medication List       Accurate as of September 25, 2019  2:06 PM. If you have any questions, ask your nurse or doctor.        Accu-Chek Guide  test strip Generic drug: glucose blood Test BS BID Dx E11.9   Accu-Chek Guide w/Device Kit 1 each by Does not apply route 2 (two) times daily. Dx E11.9   Accu-Chek Softclix Lancets lancets Test BS BID and prn Dx E11.9 What changed: Another  medication with the same name was removed. Continue taking this medication, and follow the directions you see here. Changed by: Lucienne Minks, LPN   acetaminophen 053 MG tablet Commonly known as: TYLENOL Take 500 mg by mouth every 6 (six) hours as needed.   albuterol 108 (90 Base) MCG/ACT inhaler Commonly known as: VENTOLIN HFA Inhale into the lungs every 6 (six) hours as needed for wheezing or shortness of breath.   budesonide-formoterol 80-4.5 MCG/ACT inhaler Commonly known as: SYMBICORT INHALE TWO PUFFS BY MOUTH ONCE DAILY   fluticasone 50 MCG/ACT nasal spray Commonly known as: FLONASE Place into both nostrils daily.   ipratropium-albuterol 0.5-2.5 (3) MG/3ML Soln Commonly known as: DUONEB Take 3 mLs by nebulization every 6 (six) hours as needed.   metFORMIN 750 MG 24 hr tablet Commonly known as: GLUCOPHAGE-XR Take 1 tablet (750 mg total) by mouth 2 (two) times daily.       History (reviewed): Past Medical History:  Diagnosis Date  . Anemia   . Anticoagulation monitoring by pharmacist   . Arthritis   . Asthma   . Autoimmune hepatitis ([place])   . Blood transfusion without reported diagnosis   . Cataract   . COPD (chronic obstructive pulmonary disease) ([place])   . Diabetes mellitus without complication ([place])   . Dyspnea    exertion  . Hyperlipidemia   . PE (pulmonary embolism)   . Sleep apnea    Past Surgical History:  Procedure Laterality Date  . ABDOMINAL HYSTERECTOMY    . CATARACT EXTRACTION, BILATERAL Bilateral   . CHOLECYSTECTOMY    . COLONOSCOPY WITH PROPOFOL N/A 05/07/2017   Procedure: COLONOSCOPY WITH PROPOFOL;  Surgeon: Laurence Spates, MD;  Location: [facility];  Service: Endoscopy;  Laterality: N/A;  . ESOPHAGOGASTRODUODENOSCOPY (EGD) WITH PROPOFOL N/A 05/07/2017   Procedure: ESOPHAGOGASTRODUODENOSCOPY (EGD) WITH PROPOFOL;  Surgeon: Laurence Spates, MD;  Location: [facility];  Service: Endoscopy;  Laterality: N/A;  . EYE SURGERY      Cataracts  . INCONTINENCE SURGERY  2009  . IR TRANSCATHETER BX  03/15/2018  . IR VENOGRAM HEPATIC W HEMODYNAMIC EVALUATION  03/15/2018  . LEFT AND RIGHT HEART CATHETERIZATION WITH CORONARY ANGIOGRAM N/A 05/13/2014   Procedure: LEFT AND RIGHT HEART CATHETERIZATION WITH CORONARY ANGIOGRAM;  Surgeon: [name], MD;  Location: [facility] CATH LAB;  Service: Cardiovascular;  Laterality: N/A;  . SPINE SURGERY     L5 S1 fusion  . TUBAL LIGATION    . WRIST SURGERY Right    Had surgery twice to shave bone for blood circulation improvement.   Family History  Problem Relation Age of Onset  . Cirrhosis Mother   . Hyperlipidemia Father   . Heart failure Father   . Bipolar disorder Father   . Obesity Daughter   . Hypertension Brother   . Heart attack Neg Hx   . Miscarriages / Stillbirths Neg Hx    Social History   Socioeconomic History  . Marital status: Married    Spouse name: Shanon Brow  . Number of children: 4  . Years of education: GED  . Highest education level: GED or equivalent  Occupational History  . Occupation: Unemployed  Tobacco Use  . Smoking status: Former Smoker    Packs/day: 2.00  Years: 50.00    Pack years: 100.00    Types: Cigarettes    Start date: 08/22/1977    Quit date: 04/21/2012    Years since quitting: 7.4  . Smokeless tobacco: Never Used  Substance and Sexual Activity  . Alcohol use: No  . Drug use: No  . Sexual activity: Not Currently    Birth control/protection: Surgical  Other Topics Concern  . Not on file  Social History Narrative  . Not on file   Social Determinants of Health   Financial Resource Strain: Low Risk   . Difficulty of Paying Living Expenses: Not very hard  Food Insecurity: No Food Insecurity  . Worried About Charity fundraiser in the Last Year: Never true  . Ran Out of Food in the Last Year: Never true  Transportation Needs: No Transportation Needs  . Lack of Transportation (Medical): No  . Lack of Transportation (Non-Medical): No   Physical Activity: Inactive  . Days of Exercise per Week: 0 days  . Minutes of Exercise per Session: 0 min  Stress: Stress Concern Present  . Feeling of Stress : To some extent  Social Connections: Not Isolated  . Frequency of Communication with Friends and Family: More than three times a week  . Frequency of Social Gatherings with Friends and Family: More than three times a week  . Attends Religious Services: More than 4 times per year  . Active Member of Clubs or Organizations: Yes  . Attends Archivist Meetings: More than 4 times per year  . Marital Status: Married    Activities of Daily Living In your present state of health, do you have any difficulty performing the following activities: 09/25/2019  Hearing? N  Vision? Y  Comment dry eyes which causes blurred vision at time-due for eye exam  Difficulty concentrating or making decisions? N  Walking or climbing stairs? Y  Comment due to "bad knee" and SOB  Dressing or bathing? N  Doing errands, shopping? N  Preparing Food and eating ? N  Using the Toilet? N  In the past six months, have you accidently leaked urine? Y  Comment sometimes-has had incontinence surgery in 11/2007 which helped  Do you have problems with loss of bowel control? N  Managing your Medications? N  Managing your Finances? N  Housekeeping or managing your Housekeeping? Y  Comment her husband does most of the housework but she helps as much as she can  Some recent data might be hidden    Patient Education/ Literacy How often do you need to have someone help you when you read instructions, pamphlets, or other written materials from your doctor or pharmacy?: 1 - Never What is the last grade level you completed in school?: GED  Exercise Current Exercise Habits: The patient does not participate in regular exercise at present, Exercise limited by: orthopedic condition(s);respiratory conditions(s)  Diet Patient reports consuming 3 meals a day and 1  snack(s) a day Patient reports that her primary diet is: Regular Patient reports that she does have regular access to food.   Depression Screen PHQ 2/9 Scores 09/25/2019 08/12/2019 07/03/2019 04/11/2019 10/08/2018 07/26/2018 07/02/2018  PHQ - 2 Score 1 0 0 0 1 2 0  PHQ- 9 Score - - - 0 - 5 -     Fall Risk Fall Risk  09/25/2019 08/12/2019 04/11/2019 07/26/2018 07/02/2018  Falls in the past year? 0 0 0 0 0  Number falls in past yr: - - - - -  Injury with Fall? - - - - -     Objective:  Kimberly Obrien seemed alert and oriented and she participated appropriately during our telephone visit.  Blood Pressure Weight BMI  BP Readings from Last 3 Encounters:  09/15/19 113/60  08/12/19 106/65  07/03/19 112/67   Wt Readings from Last 3 Encounters:  09/15/19 204 lb (92.5 kg)  08/12/19 210 lb 9.6 oz (95.5 kg)  07/03/19 214 lb 6.4 oz (97.3 kg)   BMI Readings from Last 1 Encounters:  09/15/19 35.02 kg/m    *Unable to obtain current vital signs, weight, and BMI due to telephone visit type  Hearing/Vision  . Melondy did not seem to have difficulty with hearing/understanding during the telephone conversation . Reports that she has not had a formal eye exam by an eye care professional within the past year . Reports that she has not had a formal hearing evaluation within the past year *Unable to fully assess hearing and vision during telephone visit type  Cognitive Function: 6CIT Screen 09/25/2019  What Year? 0 points  What month? 0 points  What time? 0 points  Count back from 20 0 points  Months in reverse 0 points  Repeat phrase 0 points  Total Score 0   (Normal:0-7, Significant for Dysfunction: >8)  Normal Cognitive Function Screening: Yes   Immunization & Health Maintenance Record Immunization History  Administered Date(s) Administered  . Tdap 04/21/2011, 04/20/2017    Health Maintenance  Topic Date Due  . INFLUENZA VACCINE  11/19/2019 (Originally 03/22/2019)  . DEXA SCAN   01/01/2020  . URINE MICROALBUMIN  08/11/2020  . PAP SMEAR-Modifier  12/31/2020  . MAMMOGRAM  07/14/2021  . TETANUS/TDAP  04/21/2027  . COLONOSCOPY  05/08/2027  . Hepatitis C Screening  Completed  . HIV Screening  Completed       Assessment  This is a routine wellness examination for Kimberly Obrien.  Health Maintenance: Due or Overdue There are no preventive care reminders to display for this patient.  Kimberly Obrien does not need a referral for Community Assistance: Care Management:   no Social Work:    no Prescription Assistance:  no Nutrition/Diabetes Education:  no   Plan:  Personalized Goals Goals Addressed            This Visit's Progress   . DIET - INCREASE WATER INTAKE       Try to drink 6-8 glasses of water daily      Personalized Health Maintenance & Screening Recommendations  Influenza vaccine Shingles vaccine  Lung Cancer Screening Recommended: no (Low Dose CT Chest recommended if Age 34-80 years, 30 pack-year currently smoking OR have quit w/in past 15 years) Hepatitis C Screening recommended: no HIV Screening recommended: no  Advanced Directives: Written information was not prepared per patient's request.  Referrals & Orders No orders of the defined types were placed in this encounter.   Follow-up Plan . Follow-up with Claretta Fraise, MD as planned . Schedule your Diabetic Eye Exam as discussed . Consider Flu and Shingles vaccines at your next visit with your PCP   I have personally reviewed and noted the following in the patient's chart:   . Medical and social history . Use of alcohol, tobacco or illicit drugs  . Current medications and supplements . Functional ability and status . Nutritional status . Physical activity . Advanced directives . List of other physicians . Hospitalizations, surgeries, and ER visits in previous 12 months . Vitals . Screenings to include  cognitive, depression, and falls . Referrals and  appointments  In addition, I have reviewed and discussed with Kimberly Obrien certain preventive protocols, quality metrics, and best practice recommendations. A written personalized care plan for preventive services as well as general preventive health recommendations is available and can be mailed to the patient at her request.      Milas Hock, LPN  08/26/1094

## 2019-09-25 NOTE — Patient Instructions (Signed)

## 2019-09-26 ENCOUNTER — Other Ambulatory Visit: Payer: Self-pay

## 2019-09-26 ENCOUNTER — Inpatient Hospital Stay: Payer: Medicare Other | Attending: Hematology

## 2019-09-26 VITALS — BP 106/42 | HR 61 | Temp 97.9°F | Resp 18

## 2019-09-26 DIAGNOSIS — D500 Iron deficiency anemia secondary to blood loss (chronic): Secondary | ICD-10-CM

## 2019-09-26 DIAGNOSIS — D508 Other iron deficiency anemias: Secondary | ICD-10-CM | POA: Diagnosis not present

## 2019-09-26 MED ORDER — SODIUM CHLORIDE 0.9 % IV SOLN
Freq: Once | INTRAVENOUS | Status: AC
  Filled 2019-09-26: qty 250

## 2019-09-26 MED ORDER — SODIUM CHLORIDE 0.9 % IV SOLN
750.0000 mg | Freq: Once | INTRAVENOUS | Status: AC
  Administered 2019-09-26: 750 mg via INTRAVENOUS
  Filled 2019-09-26: qty 15

## 2019-09-26 NOTE — Patient Instructions (Signed)
Ferric carboxymaltose injection What is this medicine? FERRIC CARBOXYMALTOSE (ferr-ik car-box-ee-mol-toes) is an iron complex. Iron is used to make healthy red blood cells, which carry oxygen and nutrients throughout the body. This medicine is used to treat anemia in people with chronic kidney disease or people who cannot take iron by mouth. This medicine may be used for other purposes; ask your health care provider or pharmacist if you have questions. COMMON BRAND NAME(S): Injectafer What should I tell my health care provider before I take this medicine? They need to know if you have any of these conditions:  high levels of iron in the blood  liver disease  an unusual or allergic reaction to iron, other medicines, foods, dyes, or preservatives  pregnant or trying to get pregnant  breast-feeding How should I use this medicine? This medicine is for infusion into a vein. It is given by a health care professional in a hospital or clinic setting. Talk to your pediatrician regarding the use of this medicine in children. Special care may be needed. Overdosage: If you think you have taken too much of this medicine contact a poison control center or emergency room at once. NOTE: This medicine is only for you. Do not share this medicine with others. What if I miss a dose? It is important not to miss your dose. Call your doctor or health care professional if you are unable to keep an appointment. What may interact with this medicine? Do not take this medicine with any of the following medications:  deferoxamine  dimercaprol  other iron products This list may not describe all possible interactions. Give your health care provider a list of all the medicines, herbs, non-prescription drugs, or dietary supplements you use. Also tell them if you smoke, drink alcohol, or use illegal drugs. Some items may interact with your medicine. What should I watch for while using this medicine? Visit your  doctor or health care professional regularly. Tell your doctor if your symptoms do not start to get better or if they get worse. You may need blood work done while you are taking this medicine. You may need to follow a special diet. Talk to your doctor. Foods that contain iron include: whole grains/cereals, dried fruits, beans, or peas, leafy green vegetables, and organ meats (liver, kidney). What side effects may I notice from receiving this medicine? Side effects that you should report to your doctor or health care professional as soon as possible:  allergic reactions like skin rash, itching or hives, swelling of the face, lips, or tongue  dizziness  facial flushing Side effects that usually do not require medical attention (report to your doctor or health care professional if they continue or are bothersome):  changes in taste  constipation  headache  nausea, vomiting  pain, redness, or irritation at site where injected This list may not describe all possible side effects. Call your doctor for medical advice about side effects. You may report side effects to FDA at 1-800-FDA-1088. Where should I keep my medicine? This drug is given in a hospital or clinic and will not be stored at home. NOTE: This sheet is a summary. It may not cover all possible information. If you have questions about this medicine, talk to your doctor, pharmacist, or health care provider.  2020 Elsevier/Gold Standard (2016-09-21 09:40:29)  Coronavirus (COVID-19) Are you at risk?  Are you at risk for the Coronavirus (COVID-19)?  To be considered HIGH RISK for Coronavirus (COVID-19), you have to meet the following   criteria:  . Traveled to [place], [place], [place], [place] or [place]; or in the [place] to [place], [place], [place], or [place]; and have fever, cough, and shortness of breath within the last 2 weeks of travel OR . Been in close contact with a person diagnosed with COVID-19 within the  last 2 weeks and have fever, cough, and shortness of breath . IF YOU DO NOT MEET THESE CRITERIA, YOU ARE CONSIDERED LOW RISK FOR COVID-19.  What to do if you are HIGH RISK for COVID-19?  . If you are having a medical emergency, call 911. . Seek medical care right away. Before you go to a doctor's office, urgent care or emergency department, call ahead and tell them about your recent travel, contact with someone diagnosed with COVID-19, and your symptoms. You should receive instructions from your physician's office regarding next steps of care.  . When you arrive at healthcare provider, tell the healthcare staff immediately you have returned from visiting [place], [place], [place], [place] or [place]; or traveled in the [place] to [place], [place], [place], or [place]; in the last two weeks or you have been in close contact with a person diagnosed with COVID-19 in the last 2 weeks.   . Tell the health care staff about your symptoms: fever, cough and shortness of breath. . After you have been seen by a medical provider, you will be either: o Tested for (COVID-19) and discharged home on quarantine except to seek medical care if symptoms worsen, and asked to  - Stay home and avoid contact with others until you get your results (4-5 days)  - Avoid travel on public transportation if possible (such as bus, train, or airplane) or o Sent to the Emergency Department by EMS for evaluation, COVID-19 testing, and possible admission depending on your condition and test results.  What to do if you are LOW RISK for COVID-19?  Reduce your risk of any infection by using the same precautions used for avoiding the common cold or flu:  . Wash your hands often with soap and warm water for at least 20 seconds.  If soap and water are not readily available, use an alcohol-based hand sanitizer with at least 60% alcohol.  . If coughing or sneezing, cover your mouth and nose by coughing or sneezing into the elbow  areas of your shirt or coat, into a tissue or into your sleeve (not your hands). . Avoid shaking hands with others and consider head nods or verbal greetings only. . Avoid touching your eyes, nose, or mouth with unwashed hands.  . Avoid close contact with people who are sick. . Avoid places or events with large numbers of people in one location, like concerts or sporting events. . Carefully consider travel plans you have or are making. . If you are planning any travel outside or inside the [place], visit the CDC's Travelers' Health webpage for the latest health notices. . If you have some symptoms but not all symptoms, continue to monitor at home and seek medical attention if your symptoms worsen. . If you are having a medical emergency, call 911.   ADDITIONAL HEALTHCARE OPTIONS FOR PATIENTS  [facility] / e-Visit: [email or web address]         [facility]: [phone]  [facility]: [phone]                   [facility]: [phone]   

## 2019-09-27 DIAGNOSIS — J449 Chronic obstructive pulmonary disease, unspecified: Secondary | ICD-10-CM | POA: Diagnosis not present

## 2019-09-29 ENCOUNTER — Telehealth: Payer: Self-pay | Admitting: *Deleted

## 2019-09-29 NOTE — Telephone Encounter (Signed)
Patient called - she had IV iron on Friday. Sunday night: began hurting all over (esp thighs and stomach), fever, chills/felt cold, and has dark urine. She said she had this happen once before with iron and wants to know what to do. Dr. Irene Limbo informed. Dr. Irene Limbo respond: would recommend going to urgent care to ensure she does not have any other infectious process like a UTI. Contacted patient - She verbalized understanding, but said she does not think it is a UTI - she's had those in past. She said she had same symptoms with the first dose of iron last time, too. She wondered if the dose was too strong and wondered about having second iron infusion. Dr. Irene Limbo informed

## 2019-09-30 ENCOUNTER — Ambulatory Visit (INDEPENDENT_AMBULATORY_CARE_PROVIDER_SITE_OTHER): Payer: Medicare Other | Admitting: Nurse Practitioner

## 2019-09-30 ENCOUNTER — Telehealth: Payer: Self-pay | Admitting: *Deleted

## 2019-09-30 ENCOUNTER — Encounter: Payer: Self-pay | Admitting: Nurse Practitioner

## 2019-09-30 DIAGNOSIS — N3000 Acute cystitis without hematuria: Secondary | ICD-10-CM

## 2019-09-30 DIAGNOSIS — B9689 Other specified bacterial agents as the cause of diseases classified elsewhere: Secondary | ICD-10-CM | POA: Diagnosis not present

## 2019-09-30 MED ORDER — CEPHALEXIN 500 MG PO CAPS: 500.0000 mg | ORAL_CAPSULE | Freq: Two times a day (BID) | ORAL | 0 refills | Status: DC

## 2019-09-30 NOTE — Progress Notes (Signed)
   Virtual Visit via telephone Note Due to COVID-19 pandemic this visit was conducted virtually. This visit type was conducted due to national recommendations for restrictions regarding the COVID-19 Pandemic (e.g. social distancing, sheltering in place) in an effort to limit this patient's exposure and mitigate transmission in our community. All issues noted in this document were discussed and addressed.  A physical exam was not performed with this format.  I connected with Kimberly Obrien on 09/30/19 at 2:20 by telephone and verified that I am speaking with the correct person using two identifiers. Kimberly Obrien is currently located at home and no one is currently with her during visit. The provider, Mary-Margaret Hassell Done, FNP is located in their office at time of visit.  I discussed the limitations, risks, security and privacy concerns of performing an evaluation and management service by telephone and the availability of in person appointments. I also discussed with the patient that there may be a patient responsible charge related to this service. The patient expressed understanding and agreed to proceed.   History and Present Illness:   Chief Complaint: Fever   HPI Patient went and had iron infusion on Friday and started with fever on Saturday. She spoke with DR. At infusion center and they said it sounded like UTI. She has slight pelvic pain, urine is dark and she is going frequently. She denies any respiratory symptoms. Patient said th elast time she had iron infusion she developed a UTI   Review of Systems  Constitutional: Positive for chills and fever.  HENT: Negative.   Respiratory: Positive for cough (occasional).   Gastrointestinal: Positive for abdominal pain (suprapubic pain.).  Genitourinary: Positive for dysuria and urgency. Negative for flank pain and hematuria.  Psychiatric/Behavioral: Negative.   All other systems reviewed and are negative.     Observations/Objective: Alert and oriented- answers all questions appropriately No distress     Assessment and Plan: Kimberly Obrien in today with chief complaint of Fever   1. Acute cystitis without hematuria Take medication as prescribe Cotton underwear Take shower not bath Cranberry juice, yogurt Force fluids AZO over the counter X2 days RTO prn     The above assessment and management plan was discussed with the patient. The patient verbalized understanding of and has agreed to the management plan. Patient is aware to call the clinic if symptoms persist or worsen. Patient is aware when to return to the clinic for a follow-up visit. Patient educated on when it is appropriate to go to the emergency department.   Mary-Margaret Hassell Done, FNP   Follow Up Instructions: prn    I discussed the assessment and treatment plan with the patient. The patient was provided an opportunity to ask questions and all were answered. The patient agreed with the plan and demonstrated an understanding of the instructions.   The patient was advised to call back or seek an in-person evaluation if the symptoms worsen or if the condition fails to improve as anticipated.  The above assessment and management plan was discussed with the patient. The patient verbalized understanding of and has agreed to the management plan. Patient is aware to call the clinic if symptoms persist or worsen. Patient is aware when to return to the clinic for a follow-up visit. Patient educated on when it is appropriate to go to the emergency department.   Time call ended:  2:37 I provided 17 minutes of non-face-to-face time during this encounter.    Mary-Margaret Hassell Done, FNP

## 2019-09-30 NOTE — Telephone Encounter (Signed)
Dr. Irene Limbo informed that patient has concerns about receiving 2nd infusion if these symptoms are related to first IV iron.  Dr Marvia Pickles response: It's possible that this could be iron hypersensitivity. the delayed timming >24h later would be unusual. Stomach cramps can occur typically during infusion. the urinary changes/fever/chills - will need to r/o UTI. Can hold off 2nd dose of Iron. We can determine for changing iron preparation upon followup.  Contacted patient with MD response. She called her PCP and will begin aantibiotics for possible UTI. She has concerns about only receiving one dose IV iron - she is willing to have second dose if spread out further than week. States problems she had in past were only after 1st one.  Dr. Irene Limbo informed of patient concerns.

## 2019-10-01 ENCOUNTER — Telehealth: Payer: Self-pay | Admitting: *Deleted

## 2019-10-01 NOTE — Telephone Encounter (Signed)
Dr. Irene Limbo response to information and patient question: Patient should complete treatment for UTI, as symptoms were UTI vs intravascular hemolysis from IV Iron. Plan will be to reassess iron stores in 3-4 months given risk vs benefit.  Patient contacted with this information. Schedule message sent to schedule patient for lab/MD visit in 3-4 months. Patient in agreement

## 2019-10-03 ENCOUNTER — Inpatient Hospital Stay: Payer: Medicare Other

## 2019-10-03 ENCOUNTER — Telehealth: Payer: Self-pay | Admitting: Hematology

## 2019-10-03 NOTE — Telephone Encounter (Signed)
Scheduled apt per 2/10 sch message - pt is aware of appt date and time

## 2019-10-25 DIAGNOSIS — J449 Chronic obstructive pulmonary disease, unspecified: Secondary | ICD-10-CM | POA: Diagnosis not present

## 2019-11-03 ENCOUNTER — Telehealth: Payer: Self-pay | Admitting: Family Medicine

## 2019-11-03 NOTE — Chronic Care Management (AMB) (Signed)
  Chronic Care Management   Note  11/03/2019 Name: Kimberly Obrien MRN: [id number] DOB: Jun 25, 1961  Kimberly Obrien is a 59 y.o. year old female who is a primary care patient of Stacks, Cletus Gash, MD. I reached out to [name] by phone today in response to a referral sent by Ms. Cathie Beams [employer]'s health plan.     Ms. Fauteux was given information about Chronic Care Management services today including:  1. CCM service includes personalized support from designated clinical staff supervised by her physician, including individualized plan of care and coordination with other care providers 2. 24/7 contact phone numbers for assistance for urgent and routine care needs. 3. Service will only be billed when office clinical staff spend 20 minutes or more in a month to coordinate care. 4. Only one practitioner may furnish and bill the service in a calendar month. 5. The patient may stop CCM services at any time (effective at the end of the month) by phone call to the office staff. 6. The patient will be responsible for cost sharing (co-pay) of up to 20% of the service fee (after annual deductible is met).  Patient agreed to services and verbal consent obtained.   Follow up plan: Telephone appointment with care management team member scheduled for: 02/17/2020.  [place], [place] [address] Direct Dial: [phone] [email or web address]: [email or web address]

## 2019-11-11 ENCOUNTER — Encounter: Payer: Self-pay | Admitting: Family Medicine

## 2019-11-11 ENCOUNTER — Other Ambulatory Visit: Payer: Self-pay

## 2019-11-11 ENCOUNTER — Ambulatory Visit (INDEPENDENT_AMBULATORY_CARE_PROVIDER_SITE_OTHER): Payer: Medicare Other | Admitting: Family Medicine

## 2019-11-11 VITALS — BP 116/69 | HR 71 | Temp 99.8°F | Ht 64.0 in | Wt 200.6 lb

## 2019-11-11 DIAGNOSIS — D500 Iron deficiency anemia secondary to blood loss (chronic): Secondary | ICD-10-CM

## 2019-11-11 DIAGNOSIS — J432 Centrilobular emphysema: Secondary | ICD-10-CM | POA: Diagnosis not present

## 2019-11-11 DIAGNOSIS — G8929 Other chronic pain: Secondary | ICD-10-CM

## 2019-11-11 DIAGNOSIS — R748 Abnormal levels of other serum enzymes: Secondary | ICD-10-CM | POA: Diagnosis not present

## 2019-11-11 DIAGNOSIS — R0602 Shortness of breath: Secondary | ICD-10-CM

## 2019-11-11 DIAGNOSIS — M25511 Pain in right shoulder: Secondary | ICD-10-CM | POA: Diagnosis not present

## 2019-11-11 DIAGNOSIS — E785 Hyperlipidemia, unspecified: Secondary | ICD-10-CM

## 2019-11-11 DIAGNOSIS — E119 Type 2 diabetes mellitus without complications: Secondary | ICD-10-CM

## 2019-11-11 LAB — BAYER DCA HB A1C WAIVED: HB A1C (BAYER DCA - WAIVED): 4.8 % (ref <7.0)

## 2019-11-11 MED ORDER — FLUTICASONE-SALMETEROL 113-14 MCG/ACT IN AEPB: 2.0000 | INHALATION_SPRAY | Freq: Two times a day (BID) | RESPIRATORY_TRACT | 11 refills | Status: DC

## 2019-11-11 MED ORDER — ALBUTEROL SULFATE HFA 108 (90 BASE) MCG/ACT IN AERS: 2.0000 | INHALATION_SPRAY | Freq: Four times a day (QID) | RESPIRATORY_TRACT | 11 refills | Status: DC | PRN

## 2019-11-11 MED ORDER — IPRATROPIUM-ALBUTEROL 0.5-2.5 (3) MG/3ML IN SOLN: 3.0000 mL | Freq: Four times a day (QID) | RESPIRATORY_TRACT | 1 refills | Status: DC | PRN

## 2019-11-11 MED ORDER — METFORMIN HCL ER 750 MG PO TB24: 750.0000 mg | ORAL_TABLET | Freq: Every day | ORAL | 5 refills | Status: DC

## 2019-11-11 NOTE — Progress Notes (Signed)
Subjective:  Patient ID: Kimberly Obrien,  female    DOB: 06/17/1961  Age: 59 y.o.    CC: Follow-up (3 month)   HPI ARVIS MIGUEZ presents for  follow-up of COPD.  She is currently not having shortness of breath and is not using her inhalers including the Symbicort.  She says it is somewhat expensive for her.  She has not needed it.  However allergy season is starting so she is looking at starting back on it if she starts wheezing.  Patient also  in for follow-up of elevated cholesterol. Doing well without complaints on current medication. Denies side effects  including myalgia and arthralgia and nausea. Also in today for liver function testing. Currently no chest pain, shortness of breath or other cardiovascular related symptoms noted.  Follow-up of diabetes. Patient does check blood sugar at home. Readings run between 100 and 130 fasting. Not checking post prandial  Patient denies symptoms such as excessive hunger or urinary frequency, excessive hunger, nausea A few mild significant hypoglycemic spells noted.Glucose will be 70-90 and make her feel bad. Medications reviewed. Pt reports taking them regularly. Pt. denies complication/adverse reaction today.   Patient tells me that she needs something for pain for her right shoulder.  It is very sore and range of motion is limited.  The left was bothering her but she has managed to work it looks.  She cannot take over-the-counter pain medicines including Tylenol because they make her bleed.   History Landra has a past medical history of Anemia, Anticoagulation monitoring by pharmacist, Arthritis, Asthma, Autoimmune hepatitis ([place] [facility]), Blood transfusion without reported diagnosis, Cataract, COPD (chronic obstructive pulmonary disease) ([place]), Diabetes mellitus without complication ([place]), Dyspnea, Hyperlipidemia, PE (pulmonary embolism), and Sleep apnea.   She has a past surgical history that includes Tubal ligation; Wrist surgery  (Right); Cholecystectomy; Spine surgery; Abdominal hysterectomy; Eye surgery; left and right heart catheterization with coronary angiogram (N/A, 05/13/2014); Incontinence surgery (2009); Cataract extraction, bilateral (Bilateral); Esophagogastroduodenoscopy (egd) with propofol (N/A, 05/07/2017); Colonoscopy with propofol (N/A, 05/07/2017); IR Venogram Hepatic W Hemodynamic Evaluation (03/15/2018); and IR Transcatheter BX (03/15/2018).   Her family history includes Bipolar disorder in her father; Cirrhosis in her mother; Heart failure in her father; Hyperlipidemia in her father; Hypertension in her brother; Obesity in her daughter.She reports that she quit smoking about 7 years ago. Her smoking use included cigarettes. She started smoking about 42 years ago. She has a 100.00 pack-year smoking history. She has never used smokeless tobacco. She reports that she does not drink alcohol or use drugs.  Current Outpatient Medications on File Prior to Visit  Medication Sig Dispense Refill  . Accu-Chek Softclix Lancets lancets Test BS BID and prn Dx E11.9 200 each 3  . acetaminophen (TYLENOL) 500 MG tablet Take 500 mg by mouth every 6 (six) hours as needed.    . Blood Glucose Monitoring Suppl (ACCU-CHEK GUIDE) w/Device KIT 1 each by Does not apply route 2 (two) times daily. Dx E11.9 1 kit 0  . fluticasone (FLONASE) 50 MCG/ACT nasal spray Place into both nostrils daily.    Marland Kitchen glucose blood (ACCU-CHEK GUIDE) test strip Test BS BID Dx E11.9 200 each 3  . [DISCONTINUED] budesonide-formoterol (SYMBICORT) 80-4.5 MCG/ACT inhaler INHALE TWO PUFFS BY MOUTH ONCE DAILY 11 g 0   No current facility-administered medications on file prior to visit.    ROS Review of Systems  Constitutional: Negative.   HENT: Negative for congestion.   Eyes: Negative for visual disturbance.  Respiratory:  Negative for shortness of breath.   Cardiovascular: Negative for chest pain.  Gastrointestinal: Negative for abdominal pain, constipation,  diarrhea, nausea and vomiting.  Genitourinary: Negative for difficulty urinating.  Musculoskeletal: Positive for arthralgias. Negative for myalgias.  Neurological: Negative for headaches.  Hematological: Bruises/bleeds easily.  Psychiatric/Behavioral: Negative for sleep disturbance.    Objective:  BP 116/69   Pulse 71   Temp 99.8 F (37.7 C) (Temporal)   Ht 5' 4"  (1.626 m)   Wt 200 lb 9.6 oz (91 kg)   BMI 34.43 kg/m   BP Readings from Last 3 Encounters:  11/11/19 116/69  09/26/19 (!) 106/42  09/15/19 113/60    Wt Readings from Last 3 Encounters:  11/11/19 200 lb 9.6 oz (91 kg)  09/15/19 204 lb (92.5 kg)  08/12/19 210 lb 9.6 oz (95.5 kg)     Physical Exam Constitutional:      General: She is not in acute distress.    Appearance: She is well-developed.  HENT:     Head: Normocephalic and atraumatic.  Eyes:     Conjunctiva/sclera: Conjunctivae normal.     Pupils: Pupils are equal, round, and reactive to light.  Neck:     Thyroid: No thyromegaly.  Cardiovascular:     Rate and Rhythm: Normal rate and regular rhythm.     Heart sounds: Normal heart sounds. No murmur.  Pulmonary:     Effort: Pulmonary effort is normal. No respiratory distress.     Breath sounds: Normal breath sounds. No wheezing or rales.  Abdominal:     General: Bowel sounds are normal. There is no distension.     Palpations: Abdomen is soft.     Tenderness: There is no abdominal tenderness.  Musculoskeletal:        General: Normal range of motion.     Cervical back: Normal range of motion and neck supple.  Lymphadenopathy:     Cervical: No cervical adenopathy.  Skin:    General: Skin is warm and dry.  Neurological:     Mental Status: She is alert and oriented to person, place, and time.  Psychiatric:        Behavior: Behavior normal.        Thought Content: Thought content normal.        Judgment: Judgment normal.     Diabetic Foot Exam - Simple   Simple Foot Form Diabetic Foot exam was  performed with the following findings: Yes 11/11/2019  1:15 PM  Visual Inspection No deformities, no ulcerations, no other skin breakdown bilaterally: Yes Sensation Testing Intact to touch and monofilament testing bilaterally: Yes Pulse Check Posterior Tibialis and Dorsalis pulse intact bilaterally: Yes Comments       Assessment & Plan:   Kavina was seen today for follow-up.  Diagnoses and all orders for this visit:  Diabetes mellitus without complication ([place]) -     CBC with Differential/Platelet -     CMP14+EGFR -     Bayer DCA Hb A1c Waived -     metFORMIN (GLUCOPHAGE-XR) 750 MG 24 hr tablet; Take 1 tablet (750 mg total) by mouth daily with breakfast.  Elevated liver enzymes -     CBC with Differential/Platelet -     CMP14+EGFR  Iron deficiency anemia due to chronic blood loss -     CBC with Differential/Platelet -     CMP14+EGFR  Chronic right shoulder pain  Centrilobular emphysema (HCC) -     albuterol (VENTOLIN HFA) 108 (90 Base) MCG/ACT inhaler; Inhale 2  puffs into the lungs every 6 (six) hours as needed for wheezing or shortness of breath. -     ipratropium-albuterol (DUONEB) 0.5-2.5 (3) MG/3ML SOLN; Take 3 mLs by nebulization every 6 (six) hours as needed. -     Fluticasone-Salmeterol 113-14 MCG/ACT AEPB; Inhale 2 puffs into the lungs in the morning and at bedtime.  Hyperlipidemia, unspecified hyperlipidemia type -     Lipid panel  Shortness of breath -     ipratropium-albuterol (DUONEB) 0.5-2.5 (3) MG/3ML SOLN; Take 3 mLs by nebulization every 6 (six) hours as needed.   I have discontinued Jalayia B. Alcocer's cephALEXin. I have changed her budesonide-formoterol to Fluticasone-Salmeterol. I have also changed her metFORMIN and albuterol. Additionally, I am having her maintain her Accu-Chek Guide, Accu-Chek Guide, Accu-Chek Softclix Lancets, fluticasone, acetaminophen, and ipratropium-albuterol.  Meds ordered this encounter  Medications  . metFORMIN  (GLUCOPHAGE-XR) 750 MG 24 hr tablet    Sig: Take 1 tablet (750 mg total) by mouth daily with breakfast.    Dispense:  30 tablet    Refill:  5  . albuterol (VENTOLIN HFA) 108 (90 Base) MCG/ACT inhaler    Sig: Inhale 2 puffs into the lungs every 6 (six) hours as needed for wheezing or shortness of breath.    Dispense:  18 g    Refill:  11  . ipratropium-albuterol (DUONEB) 0.5-2.5 (3) MG/3ML SOLN    Sig: Take 3 mLs by nebulization every 6 (six) hours as needed.    Dispense:  360 mL    Refill:  1  . Fluticasone-Salmeterol 113-14 MCG/ACT AEPB    Sig: Inhale 2 puffs into the lungs in the morning and at bedtime.    Dispense:  1 each    Refill:  11     Follow-up: Return in about 3 months (around 02/11/2020).  Claretta Fraise, M.D.

## 2019-11-12 LAB — CMP14+EGFR
ALT: 34 IU/L — ABNORMAL HIGH (ref 0–32)
AST: 43 IU/L — ABNORMAL HIGH (ref 0–40)
Albumin/Globulin Ratio: 1.7 (ref 1.2–2.2)
Albumin: 3.7 g/dL — ABNORMAL LOW (ref 3.8–4.9)
Alkaline Phosphatase: 107 IU/L (ref 39–117)
BUN/Creatinine Ratio: 13 (ref 9–23)
BUN: 7 mg/dL (ref 6–24)
Bilirubin Total: 2.8 mg/dL — ABNORMAL HIGH (ref 0.0–1.2)
CO2: 24 mmol/L (ref 20–29)
Calcium: 8.7 mg/dL (ref 8.7–10.2)
Chloride: 107 mmol/L — ABNORMAL HIGH (ref 96–106)
Creatinine, Ser: 0.52 mg/dL — ABNORMAL LOW (ref 0.57–1.00)
GFR calc Af Amer: 121 mL/min/{1.73_m2} (ref >59)
GFR calc non Af Amer: 105 mL/min/{1.73_m2} (ref >59)
Globulin, Total: 2.2 g/dL (ref 1.5–4.5)
Glucose: 93 mg/dL (ref 65–99)
Potassium: 3.3 mmol/L — ABNORMAL LOW (ref 3.5–5.2)
Sodium: 146 mmol/L — ABNORMAL HIGH (ref 134–144)
Total Protein: 5.9 g/dL — ABNORMAL LOW (ref 6.0–8.5)

## 2019-11-12 LAB — CBC WITH DIFFERENTIAL/PLATELET
Basophils Absolute: 0 10*3/uL (ref 0.0–0.2)
Basos: 0 %
EOS (ABSOLUTE): 0.1 10*3/uL (ref 0.0–0.4)
Eos: 1 %
Hematocrit: 44.6 % (ref 34.0–46.6)
Hemoglobin: 15.4 g/dL (ref 11.1–15.9)
Immature Grans (Abs): 0 10*3/uL (ref 0.0–0.1)
Immature Granulocytes: 0 %
Lymphocytes Absolute: 1.2 10*3/uL (ref 0.7–3.1)
Lymphs: 23 %
MCH: 31 pg (ref 26.6–33.0)
MCHC: 34.5 g/dL (ref 31.5–35.7)
MCV: 90 fL (ref 79–97)
Monocytes Absolute: 0.3 10*3/uL (ref 0.1–0.9)
Monocytes: 6 %
Neutrophils Absolute: 3.6 10*3/uL (ref 1.4–7.0)
Neutrophils: 70 %
Platelets: 88 10*3/uL — CL (ref 150–450)
RBC: 4.96 x10E6/uL (ref 3.77–5.28)
RDW: 14.2 % (ref 11.7–15.4)
WBC: 5.1 10*3/uL (ref 3.4–10.8)

## 2019-11-12 LAB — SPECIMEN STATUS REPORT

## 2019-11-12 LAB — LIPID PANEL
Chol/HDL Ratio: 2.5 ratio (ref 0.0–4.4)
Cholesterol, Total: 152 mg/dL (ref 100–199)
HDL: 61 mg/dL (ref >39)
LDL Chol Calc (NIH): 72 mg/dL (ref 0–99)
Triglycerides: 106 mg/dL (ref 0–149)
VLDL Cholesterol Cal: 19 mg/dL (ref 5–40)

## 2019-11-12 NOTE — Progress Notes (Signed)
Hello  Kimberly Obrien,    Your lab result is normal and/or stable.Some minor variations that are not significant are commonly marked abnormal, but do not represent any medical problem for you.   Best regards,  Claretta Fraise, M.D.

## 2019-11-18 ENCOUNTER — Telehealth: Payer: Self-pay | Admitting: *Deleted

## 2019-11-18 MED ORDER — ALBUTEROL SULFATE HFA 108 (90 BASE) MCG/ACT IN AERS: 2.0000 | INHALATION_SPRAY | Freq: Four times a day (QID) | RESPIRATORY_TRACT | 1 refills | Status: DC | PRN

## 2019-11-18 NOTE — Telephone Encounter (Signed)
Albuterol AER HFA is no on the patient's formulary Drug List   Suggested alternatives from Drug list Albuterol HFA (ProAir) Teir 2 Proair HFA Teir 3 Proair Respiclick Teir 3

## 2019-11-18 NOTE — Telephone Encounter (Signed)
Air rx sent to pharmacy

## 2019-11-19 NOTE — Telephone Encounter (Signed)
Patient aware and verbalizes understanding. 

## 2019-11-25 DIAGNOSIS — J449 Chronic obstructive pulmonary disease, unspecified: Secondary | ICD-10-CM | POA: Diagnosis not present

## 2019-12-11 DIAGNOSIS — Z029 Encounter for administrative examinations, unspecified: Secondary | ICD-10-CM

## 2019-12-23 ENCOUNTER — Other Ambulatory Visit: Payer: Self-pay | Admitting: *Deleted

## 2019-12-23 DIAGNOSIS — D500 Iron deficiency anemia secondary to blood loss (chronic): Secondary | ICD-10-CM

## 2019-12-24 ENCOUNTER — Inpatient Hospital Stay: Payer: Medicare Other | Admitting: Hematology

## 2019-12-24 ENCOUNTER — Inpatient Hospital Stay: Payer: Medicare Other

## 2019-12-25 DIAGNOSIS — J449 Chronic obstructive pulmonary disease, unspecified: Secondary | ICD-10-CM | POA: Diagnosis not present

## 2019-12-26 ENCOUNTER — Other Ambulatory Visit: Payer: Self-pay | Admitting: Family Medicine

## 2019-12-26 DIAGNOSIS — E119 Type 2 diabetes mellitus without complications: Secondary | ICD-10-CM

## 2020-01-25 DIAGNOSIS — J449 Chronic obstructive pulmonary disease, unspecified: Secondary | ICD-10-CM | POA: Diagnosis not present

## 2020-02-11 ENCOUNTER — Encounter: Payer: Self-pay | Admitting: Family Medicine

## 2020-02-11 ENCOUNTER — Telehealth: Payer: Self-pay | Admitting: Family Medicine

## 2020-02-11 DIAGNOSIS — K719 Toxic liver disease, unspecified: Secondary | ICD-10-CM | POA: Insufficient documentation

## 2020-02-11 DIAGNOSIS — T466X5A Adverse effect of antihyperlipidemic and antiarteriosclerotic drugs, initial encounter: Secondary | ICD-10-CM | POA: Insufficient documentation

## 2020-02-11 NOTE — Chronic Care Management (AMB) (Signed)
  Care Management   Note  02/11/2020 Name: Kimberly Obrien MRN: [id number] DOB: [birth year]  [name] is a 59 y.o. year old female who is a primary care patient of Stacks, Cletus Gash, MD and is actively engaged with the care management team. I reached out to [name] by phone today to assist with re-scheduling an initial visit with the RN Case Manager  Follow up plan: Telephone appointment with care management team member scheduled for:02/18/2020  [name], [place], [place], [place] [address] Direct Dial: [phone] [name]: [email or web address]

## 2020-02-12 ENCOUNTER — Ambulatory Visit (INDEPENDENT_AMBULATORY_CARE_PROVIDER_SITE_OTHER): Payer: Medicare Other | Admitting: Family Medicine

## 2020-02-12 ENCOUNTER — Other Ambulatory Visit: Payer: Self-pay

## 2020-02-12 ENCOUNTER — Encounter: Payer: Self-pay | Admitting: Family Medicine

## 2020-02-12 VITALS — BP 136/73 | HR 97 | Temp 98.6°F | Resp 20 | Ht 64.0 in | Wt 208.4 lb

## 2020-02-12 DIAGNOSIS — L14 Bullous disorders in diseases classified elsewhere: Secondary | ICD-10-CM

## 2020-02-12 DIAGNOSIS — E119 Type 2 diabetes mellitus without complications: Secondary | ICD-10-CM

## 2020-02-12 DIAGNOSIS — F411 Generalized anxiety disorder: Secondary | ICD-10-CM

## 2020-02-12 DIAGNOSIS — E11628 Type 2 diabetes mellitus with other skin complications: Secondary | ICD-10-CM

## 2020-02-12 DIAGNOSIS — E785 Hyperlipidemia, unspecified: Secondary | ICD-10-CM | POA: Diagnosis not present

## 2020-02-12 DIAGNOSIS — J432 Centrilobular emphysema: Secondary | ICD-10-CM | POA: Diagnosis not present

## 2020-02-12 DIAGNOSIS — T466X5A Adverse effect of antihyperlipidemic and antiarteriosclerotic drugs, initial encounter: Secondary | ICD-10-CM

## 2020-02-12 DIAGNOSIS — F5104 Psychophysiologic insomnia: Secondary | ICD-10-CM

## 2020-02-12 DIAGNOSIS — K719 Toxic liver disease, unspecified: Secondary | ICD-10-CM

## 2020-02-12 DIAGNOSIS — K754 Autoimmune hepatitis: Secondary | ICD-10-CM

## 2020-02-12 LAB — BAYER DCA HB A1C WAIVED: HB A1C (BAYER DCA - WAIVED): 5.4 % (ref <7.0)

## 2020-02-12 MED ORDER — FLUTICASONE PROPIONATE 50 MCG/ACT NA SUSP: 2.0000 | Freq: Every day | NASAL | 11 refills | Status: DC

## 2020-02-12 MED ORDER — METFORMIN HCL ER 750 MG PO TB24: 1500.0000 mg | ORAL_TABLET | Freq: Every day | ORAL | 1 refills | Status: DC

## 2020-02-12 MED ORDER — CITALOPRAM HYDROBROMIDE 20 MG PO TABS: 20.0000 mg | ORAL_TABLET | Freq: Every day | ORAL | 3 refills | Status: DC

## 2020-02-12 MED ORDER — BUDESONIDE-FORMOTEROL FUMARATE 80-4.5 MCG/ACT IN AERO: 2.0000 | INHALATION_SPRAY | Freq: Two times a day (BID) | RESPIRATORY_TRACT | 12 refills | Status: DC

## 2020-02-12 MED ORDER — FLUOCINONIDE-E 0.05 % EX CREA: 1.0000 "application " | TOPICAL_CREAM | Freq: Two times a day (BID) | CUTANEOUS | 5 refills | Status: DC

## 2020-02-12 MED ORDER — TRAZODONE HCL 150 MG PO TABS: ORAL_TABLET | ORAL | 5 refills | Status: DC

## 2020-02-13 LAB — CMP14+EGFR
ALT: 32 IU/L (ref 0–32)
AST: 42 IU/L — ABNORMAL HIGH (ref 0–40)
Albumin/Globulin Ratio: 1.6 (ref 1.2–2.2)
Albumin: 3.8 g/dL (ref 3.8–4.9)
Alkaline Phosphatase: 88 IU/L (ref 48–121)
BUN/Creatinine Ratio: 14 (ref 9–23)
BUN: 8 mg/dL (ref 6–24)
Bilirubin Total: 2.6 mg/dL — ABNORMAL HIGH (ref 0.0–1.2)
CO2: 24 mmol/L (ref 20–29)
Calcium: 8.8 mg/dL (ref 8.7–10.2)
Chloride: 106 mmol/L (ref 96–106)
Creatinine, Ser: 0.59 mg/dL (ref 0.57–1.00)
GFR calc Af Amer: 116 mL/min/{1.73_m2} (ref >59)
GFR calc non Af Amer: 101 mL/min/{1.73_m2} (ref >59)
Globulin, Total: 2.4 g/dL (ref 1.5–4.5)
Glucose: 110 mg/dL — ABNORMAL HIGH (ref 65–99)
Potassium: 3.8 mmol/L (ref 3.5–5.2)
Sodium: 142 mmol/L (ref 134–144)
Total Protein: 6.2 g/dL (ref 6.0–8.5)

## 2020-02-13 LAB — LIPID PANEL
Chol/HDL Ratio: 2.8 ratio (ref 0.0–4.4)
Cholesterol, Total: 156 mg/dL (ref 100–199)
HDL: 55 mg/dL (ref >39)
LDL Chol Calc (NIH): 75 mg/dL (ref 0–99)
Triglycerides: 152 mg/dL — ABNORMAL HIGH (ref 0–149)
VLDL Cholesterol Cal: 26 mg/dL (ref 5–40)

## 2020-02-13 LAB — CBC WITH DIFFERENTIAL/PLATELET
Basophils Absolute: 0 10*3/uL (ref 0.0–0.2)
Basos: 1 %
EOS (ABSOLUTE): 0 10*3/uL (ref 0.0–0.4)
Eos: 1 %
Hematocrit: 41.3 % (ref 34.0–46.6)
Hemoglobin: 14.7 g/dL (ref 11.1–15.9)
Immature Grans (Abs): 0 10*3/uL (ref 0.0–0.1)
Immature Granulocytes: 0 %
Lymphocytes Absolute: 1 10*3/uL (ref 0.7–3.1)
Lymphs: 18 %
MCH: 31.3 pg (ref 26.6–33.0)
MCHC: 35.6 g/dL (ref 31.5–35.7)
MCV: 88 fL (ref 79–97)
Monocytes Absolute: 0.4 10*3/uL (ref 0.1–0.9)
Monocytes: 7 %
Neutrophils Absolute: 4.1 10*3/uL (ref 1.4–7.0)
Neutrophils: 73 %
Platelets: 92 10*3/uL — CL (ref 150–450)
RBC: 4.7 x10E6/uL (ref 3.77–5.28)
RDW: 12.7 % (ref 11.7–15.4)
WBC: 5.6 10*3/uL (ref 3.4–10.8)

## 2020-02-15 ENCOUNTER — Encounter: Payer: Self-pay | Admitting: Family Medicine

## 2020-02-15 NOTE — Progress Notes (Signed)
Subjective:  Patient ID: Kimberly Obrien, female    DOB: [birth year]  Age: 59 y.o. MRN: [id number]  CC: No chief complaint on file.   HPI Kimberly Obrien presents for follow-up of diabetes. Patient does not check blood sugar at home Patient denies symptoms such as polyuria, polydipsia, excessive hunger, nausea No significant hypoglycemic spells noted. Medications as noted below. Taking them regularly without complication/adverse reaction being reported today.  She actually request to be increased on the Metformin.  She says she just felt better taking 2 daily.  Patient reports she has been sleeping poorly.  The meds given seem to cause some hangover.  She says she is anxious.  When asked what the circumstances might be she just says "life ".  She does have some lesions on the forearms that have been intermittent.  They come and go.  They get pruritic so she scratches them and make sores.  Depression screen [facility] 2/9 02/12/2020 11/11/2019 09/25/2019  Decreased Interest 0 0 0  Down, Depressed, Hopeless 0 0 1  PHQ - 2 Score 0 0 1  Altered sleeping - - -  Tired, decreased energy - - -  Change in appetite - - -  Feeling bad or failure about yourself  - - -  Trouble concentrating - - -  Moving slowly or fidgety/restless - - -  Suicidal thoughts - - -  PHQ-9 Score - - -  Some recent data might be hidden    History Kimberly Obrien has a past medical history of Anemia, Anticoagulation monitoring by pharmacist, Arthritis, Asthma, Autoimmune hepatitis ([place]), Blood transfusion without reported diagnosis, Cataract, COPD (chronic obstructive pulmonary disease) ([place]), Diabetes mellitus without complication ([place]), Dyspnea, Elevated hemoglobin A1c (03/01/2018), History of pulmonary embolism (02/04/2013), Hyperlipidemia, PE (pulmonary embolism), and Sleep apnea.   She has a past surgical history that includes Tubal ligation; Wrist surgery (Right); Cholecystectomy; Spine surgery; Abdominal hysterectomy; Eye  surgery; left and right heart catheterization with coronary angiogram (N/A, 05/13/2014); Incontinence surgery (2009); Cataract extraction, bilateral (Bilateral); Esophagogastroduodenoscopy (egd) with propofol (N/A, 05/07/2017); Colonoscopy with propofol (N/A, 05/07/2017); IR Venogram Hepatic W Hemodynamic Evaluation (03/15/2018); and IR Transcatheter BX (03/15/2018).   Her family history includes Bipolar disorder in her father; Cirrhosis in her mother; Heart failure in her father; Hyperlipidemia in her father; Hypertension in her brother; Obesity in her daughter.She reports that she quit smoking about 7 years ago. Her smoking use included cigarettes. She started smoking about 42 years ago. She has a 100.00 pack-year smoking history. She has never used smokeless tobacco. She reports that she does not drink alcohol and does not use drugs.    ROS Review of Systems  Constitutional: Negative.   HENT: Negative for congestion.   Eyes: Negative for visual disturbance.  Respiratory: Negative for shortness of breath.   Cardiovascular: Negative for chest pain.  Gastrointestinal: Negative for abdominal pain, constipation, diarrhea, nausea and vomiting.  Genitourinary: Negative for difficulty urinating.  Musculoskeletal: Negative for arthralgias and myalgias.  Neurological: Negative for headaches.  Psychiatric/Behavioral: Negative for sleep disturbance.    Objective:  BP 136/73   Pulse 97   Temp 98.6 F (37 C) (Temporal)   Resp 20   Ht 5' 4"  (1.626 m)   Wt 208 lb 6 oz (94.5 kg)   SpO2 94%   BMI 35.77 kg/m   BP Readings from Last 3 Encounters:  02/12/20 136/73  11/11/19 116/69  09/26/19 (!) 106/42    Wt Readings from Last 3 Encounters:  02/12/20 208 lb  6 oz (94.5 kg)  11/11/19 200 lb 9.6 oz (91 kg)  09/15/19 204 lb (92.5 kg)     Physical Exam Constitutional:      General: She is not in acute distress.    Appearance: She is well-developed.  HENT:     Head: Normocephalic and atraumatic.      Right Ear: External ear normal.     Left Ear: External ear normal.     Nose: Nose normal.  Eyes:     Conjunctiva/sclera: Conjunctivae normal.     Pupils: Pupils are equal, round, and reactive to light.  Neck:     Thyroid: No thyromegaly.  Cardiovascular:     Rate and Rhythm: Normal rate and regular rhythm.     Heart sounds: Normal heart sounds. No murmur heard.   Pulmonary:     Effort: Pulmonary effort is normal. No respiratory distress.     Breath sounds: Normal breath sounds. No wheezing or rales.  Abdominal:     General: Bowel sounds are normal. There is no distension.     Palpations: Abdomen is soft.     Tenderness: There is no abdominal tenderness.  Musculoskeletal:     Cervical back: Normal range of motion and neck supple.  Lymphadenopathy:     Cervical: No cervical adenopathy.  Skin:    General: Skin is warm and dry.     Findings: Lesion (About a half dozen excoriated rather rounded lesions about 4 mm each are noted on the forearms.) present.  Neurological:     Mental Status: She is alert and oriented to person, place, and time.     Deep Tendon Reflexes: Reflexes are normal and symmetric.  Psychiatric:        Behavior: Behavior normal.        Thought Content: Thought content normal.        Judgment: Judgment normal.       Assessment & Plan:   Diagnoses and all orders for this visit:  Diabetes mellitus without complication ([place]) -     Bayer DCA Hb A1c Waived -     CBC with Differential/Platelet -     CMP14+EGFR -     Lipid panel -     metFORMIN (GLUCOPHAGE-XR) 750 MG 24 hr tablet; Take 2 tablets (1,500 mg total) by mouth daily with breakfast.  Hyperlipidemia, unspecified hyperlipidemia type -     CBC with Differential/Platelet -     CMP14+EGFR -     Lipid panel  GAD (generalized anxiety disorder) -     citalopram (CELEXA) 20 MG tablet; Take 1 tablet (20 mg total) by mouth daily. In the evening, for anxiety  Psychophysiological insomnia -      traZODone (DESYREL) 150 MG tablet; Use from 1/3 to 1 tablet nightly as needed for sleep.  Centrilobular emphysema (HCC) -     CBC with Differential/Platelet -     budesonide-formoterol (SYMBICORT) 80-4.5 MCG/ACT inhaler; Inhale 2 puffs into the lungs in the morning and at bedtime. -     fluticasone (FLONASE) 50 MCG/ACT nasal spray; Place 2 sprays into both nostrils daily.  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 To affected areas       I have discontinued Nohemi B. Greenspan's Fluticasone-Salmeterol. I have also changed her metFORMIN and fluticasone. Additionally,  I am having her start on citalopram, traZODone, fluocinonide-emollient, and budesonide-formoterol. Lastly, I am having her maintain her Accu-Chek Guide, Accu-Chek Guide, Accu-Chek Softclix Lancets, acetaminophen, ipratropium-albuterol, and albuterol.  Allergies as of 02/12/2020      Reactions   Aciphex [rabeprazole Sodium] Rash   Fluconazole Rash, Hives   Lansoprazole Hives   Sucralfate Nausea Only   Vancomycin Diarrhea   Claritin [loratadine] Other (See Comments)   Tired   Crestor [rosuvastatin] Other (See Comments)   Elevated blood glucose and abdominal pain   Doxycycline Rash   Ibuprofen Other (See Comments)   Upset stomach   Lipitor [atorvastatin] Other (See Comments)   Myalgias, leg pain.  Problems with pancreas, sugars went up also.   Macrobid [nitrofurantoin Macrocrystal] Nausea And Vomiting   Metoprolol Nausea And Vomiting   Metronidazole Other (See Comments), Nausea Only   Dizziness, nausea, dry mouth and some shortness of breath   Nsaids Other (See Comments)   Upset stomach   Omeprazole    bloating   Rabeprazole Rash   Septra [sulfamethoxazole-trimethoprim] Nausea And Vomiting      Medication List        Accurate as of February 12, 2020 11:59 PM. If you have any questions, ask your nurse or doctor.        STOP taking these medications   Fluticasone-Salmeterol 113-14 MCG/ACT Aepb Stopped by: Claretta Fraise, MD     TAKE these medications   Accu-Chek Guide test strip Generic drug: glucose blood Test BS BID Dx E11.9   Accu-Chek Guide w/Device Kit 1 each by Does not apply route 2 (two) times daily. Dx E11.9   Accu-Chek Softclix Lancets lancets Test BS BID and prn Dx E11.9   acetaminophen 500 MG tablet Commonly known as: TYLENOL Take 500 mg by mouth every 6 (six) hours as needed.   albuterol 108 (90 Base) MCG/ACT inhaler Commonly known as: VENTOLIN HFA Inhale 2 puffs into the lungs every 6 (six) hours as needed for wheezing or shortness of breath.   budesonide-formoterol 80-4.5 MCG/ACT inhaler Commonly known as: Symbicort Inhale 2 puffs into the lungs in the morning and at bedtime. Started by: Claretta Fraise, MD   citalopram 20 MG tablet Commonly known as: CELEXA Take 1 tablet (20 mg total) by mouth daily. In the evening, for anxiety Started by: Claretta Fraise, MD   fluocinonide-emollient 0.05 % cream Commonly known as: LIDEX-E Apply 1 application topically 2 (two) times daily. To affected areas Started by: Claretta Fraise, MD   fluticasone 50 MCG/ACT nasal spray Commonly known as: FLONASE Place 2 sprays into both nostrils daily. What changed: how much to take Changed by: Claretta Fraise, MD   ipratropium-albuterol 0.5-2.5 (3) MG/3ML Soln Commonly known as: DUONEB Take 3 mLs by nebulization every 6 (six) hours as needed.   metFORMIN 750 MG 24 hr tablet Commonly known as: GLUCOPHAGE-XR Take 2 tablets (1,500 mg total) by mouth daily with breakfast. What changed: See the new instructions. Changed by: Claretta Fraise, MD   traZODone 150 MG tablet Commonly known as: DESYREL Use from 1/3 to 1 tablet nightly as needed for sleep. Started by: Claretta Fraise, MD         Follow-up: Return in about 6 months (around 08/13/2020).  Claretta Fraise, M.D.

## 2020-02-16 ENCOUNTER — Other Ambulatory Visit: Payer: Self-pay | Admitting: Family Medicine

## 2020-02-16 ENCOUNTER — Telehealth: Payer: Self-pay | Admitting: Family Medicine

## 2020-02-16 MED ORDER — FLUOCINONIDE 0.05 % EX OINT: 1.0000 "application " | TOPICAL_OINTMENT | Freq: Two times a day (BID) | CUTANEOUS | 5 refills | Status: DC

## 2020-02-16 NOTE — Telephone Encounter (Signed)
I sent in the substitute

## 2020-02-17 ENCOUNTER — Telehealth: Payer: Medicare Other

## 2020-02-17 NOTE — Telephone Encounter (Signed)
Patient aware.

## 2020-02-18 ENCOUNTER — Telehealth: Payer: Self-pay | Admitting: *Deleted

## 2020-02-18 ENCOUNTER — Ambulatory Visit (INDEPENDENT_AMBULATORY_CARE_PROVIDER_SITE_OTHER): Payer: Medicare Other | Admitting: *Deleted

## 2020-02-18 ENCOUNTER — Other Ambulatory Visit: Payer: Self-pay | Admitting: Family Medicine

## 2020-02-18 DIAGNOSIS — E119 Type 2 diabetes mellitus without complications: Secondary | ICD-10-CM | POA: Diagnosis not present

## 2020-02-18 DIAGNOSIS — J432 Centrilobular emphysema: Secondary | ICD-10-CM

## 2020-02-18 MED ORDER — TRIAMCINOLONE ACETONIDE 0.1 % EX CREA
1.0000 | TOPICAL_CREAM | Freq: Three times a day (TID) | CUTANEOUS | 0 refills | Status: DC
Start: 2020-02-18 — End: 2021-05-19

## 2020-02-18 NOTE — Telephone Encounter (Signed)
02/18/2020  I spoke with Kimberly Obrien today regarding CCM services. We discussed her mediations and she would like a cheaper alternative to Lidex. Original script was for Lidex-E which wasn't covered well by insurance. Lidex cream was sent in and was still $52 as a tier 3 preferred generic. She purchased that, but would like a cheaper alternative sent in and kept on file at the pharmacy until a refill is needed.   I will forward to PCP/clinical team for review and action.  Chong Sicilian, BSN, RN-BC Embedded Chronic Care Manager [facility] Direct Dial: [phone]

## 2020-02-18 NOTE — Patient Instructions (Signed)
Visit Information  Goals Addressed            This Visit's Progress   . Chronic Disease Management Needs       CARE PLAN ENTRY (see longtitudinal plan of care for additional care plan information)  Current Barriers:  . Chronic Disease Management support, education, and care coordination needs related to COPD, DM, arthritis, HLD, GAD  Clinical Goal(s) related to COPD, DM, arthritis, HLD, GAD:  Over the next 60 days, patient will:  . Work with the care management team to address educational, disease management, and care coordination needs  . Begin or continue self health monitoring activities as directed today Measure and record cbg (blood glucose) 1 times daily . Call provider office for new or worsened signs and symptoms Blood glucose findings outside established parameters . Call care management team with questions or concerns . Verbalize basic understanding of patient centered plan of care established today  Interventions related to COPD, DM, arthritis, HLD, GAD:  . Evaluation of current treatment plans and patient's adherence to plan as established by provider . Assessed patient understanding of disease states . Assessed patient's education and care coordination needs . Provided disease specific education to patient  . Collaborated with appropriate clinical care team members regarding patient needs . Chart reviewed including recent office note and lab results . Medications reviewed and discussed . Discussed cost of Lidex and Symbicort . Referral to [place] team for prescription assistance for Symbicort . RNCM will collaborate with PCP regarding Lidex alternatives . Discussed home blood sugar testing o Testing 1 to 2 times daily o No hypoglycemic readings o Reports that blood sugar this morning was 126 . Discussed that patient is aware of when blood sugar is dropping o Discussed corrective action . Reviewed upcoming appointments o Dr Livia Snellen 08/11/20   Patient  Self Care Activities related to COPD, DM, arthritis, HLD, GAD:  . Patient is able to perform ADLs and IADLs independently  Initial goal documentation        Ms. Schartz was given information about Chronic Care Management services today including:  1. CCM service includes personalized support from designated clinical staff supervised by her physician, including individualized plan of care and coordination with other care providers 2. 24/7 contact phone numbers for assistance for urgent and routine care needs. 3. Service will only be billed when office clinical staff spend 20 minutes or more in a month to coordinate care. 4. Only one practitioner may furnish and bill the service in a calendar month. 5. The patient may stop CCM services at any time (effective at the end of the month) by phone call to the office staff. 6. The patient will be responsible for cost sharing (co-pay) of up to 20% of the service fee (after annual deductible is met).  Patient agreed to services and verbal consent obtained.   Patient verbalizes understanding of instructions provided today.   Plan:  The care management team will reach out to the patient again over the next 60 days.   Dr Livia Snellen 07/2020 Care guide referral to help with Rx assistance  Chong Sicilian, BSN, RN-BC [place] [facility] [place] [facility] Direct Dial: [phone]

## 2020-02-18 NOTE — Telephone Encounter (Signed)
Please let the patient know that I sent their prescription to their pharmacy. Thanks, WS

## 2020-02-18 NOTE — Telephone Encounter (Signed)
Patient aware.

## 2020-02-18 NOTE — Chronic Care Management (AMB) (Signed)
Chronic Care Management   Initial Visit Note  02/18/2020 Name: TRITIA ENDO MRN: [id number] DOB: 20-Dec-1960  Referred by: Claretta Fraise, MD Reason for referral : Chronic Care Management (Initial Visit)   JEFF MCCALLUM is a 59 y.o. year old female who is a primary care patient of Stacks, Cletus Gash, MD. The CCM team was consulted for assistance with chronic disease management and care coordination needs related to COPD, DM, arthritis, HLD, GAD.  Review of patient status, including review of consultants reports, relevant laboratory and other test results, and collaboration with appropriate care team members and the patient's provider was performed as part of comprehensive patient evaluation and provision of chronic care management services.    Subjective: I spoke with Ms Chumley by telephone today regarding management of her chronic medical conditions.   SDOH (Social Determinants of Health) assessments performed: Yes See Care Plan activities for detailed interventions related to SDOH  SDOH Interventions     Most Recent Value  SDOH Interventions  Stress Interventions Other (Comment)  [Recommended that she talk with CCM LCSW. Declined at this time but will consider.]       Objective: Outpatient Encounter Medications as of 02/18/2020  Medication Sig Note  . Accu-Chek Softclix Lancets lancets Test BS BID and prn Dx E11.9   . acetaminophen (TYLENOL) 500 MG tablet Take 500 mg by mouth every 6 (six) hours as needed.   Marland Kitchen albuterol (VENTOLIN HFA) 108 (90 Base) MCG/ACT inhaler Inhale 2 puffs into the lungs every 6 (six) hours as needed for wheezing or shortness of breath.   . Blood Glucose Monitoring Suppl (ACCU-CHEK GUIDE) w/Device KIT 1 each by Does not apply route 2 (two) times daily. Dx E11.9   . budesonide-formoterol (SYMBICORT) 80-4.5 MCG/ACT inhaler Inhale 2 puffs into the lungs in the morning and at bedtime. Referral to Care Guide for Rx assistance  . citalopram (CELEXA) 20 MG  tablet Take 1 tablet (20 mg total) by mouth daily. In the evening, for anxiety   . fluocinonide ointment (LIDEX) 0.17 % Apply 1 application topically 2 (two) times daily. 02/18/2020: Too expensive  . fluticasone (FLONASE) 50 MCG/ACT nasal spray Place 2 sprays into both nostrils daily.   Marland Kitchen glucose blood (ACCU-CHEK GUIDE) test strip Test BS BID Dx E11.9   . ipratropium-albuterol (DUONEB) 0.5-2.5 (3) MG/3ML SOLN Take 3 mLs by nebulization every 6 (six) hours as needed.   . metFORMIN (GLUCOPHAGE-XR) 750 MG 24 hr tablet Take 2 tablets (1,500 mg total) by mouth daily with breakfast.   . traZODone (DESYREL) 150 MG tablet Use from 1/3 to 1 tablet nightly as needed for sleep.    No facility-administered encounter medications on file as of 02/18/2020.    Lab Results  Component Value Date   HGBA1C 5.4 02/12/2020   HGBA1C 4.8 11/11/2019   HGBA1C 6.9 08/12/2019   Lab Results  Component Value Date   LDLCALC 75 02/12/2020   CREATININE 0.59 02/12/2020   Lab Results  Component Value Date   CHOL 156 02/12/2020   HDL 55 02/12/2020   LDLCALC 75 02/12/2020   TRIG 152 (H) 02/12/2020   CHOLHDL 2.8 02/12/2020     RN Care Plan           This Visit's Progress   . Chronic Disease Management Needs       CARE PLAN ENTRY (see longtitudinal plan of care for additional care plan information)  Current Barriers:  . Chronic Disease Management support, education, and care coordination needs related to  COPD, DM, arthritis, HLD, GAD  Clinical Goal(s) related to COPD, DM, arthritis, HLD, GAD:  Over the next 60 days, patient will:  . Work with the care management team to address educational, disease management, and care coordination needs  . Begin or continue self health monitoring activities as directed today Measure and record cbg (blood glucose) 1 times daily . Call provider office for new or worsened signs and symptoms Blood glucose findings outside established parameters . Call care management team with  questions or concerns . Verbalize basic understanding of patient centered plan of care established today  Interventions related to COPD, DM, arthritis, HLD, GAD:  . Evaluation of current treatment plans and patient's adherence to plan as established by provider . Assessed patient understanding of disease states . Assessed patient's education and care coordination needs . Provided disease specific education to patient  . Collaborated with appropriate clinical care team members regarding patient needs . Chart reviewed including recent office note and lab results . Medications reviewed and discussed . Discussed cost of Lidex and Symbicort . Referral to [place] team for prescription assistance for Symbicort . RNCM will collaborate with PCP regarding Lidex alternatives . Discussed home blood sugar testing o Testing 1 to 2 times daily o No hypoglycemic readings o Reports that blood sugar this morning was 126 . Discussed that patient is aware of when blood sugar is dropping o Discussed corrective action . Reviewed upcoming appointments o Dr Livia Snellen 08/11/20   Patient Self Care Activities related to COPD, DM, arthritis, HLD, GAD:  . Patient is able to perform ADLs and IADLs independently  Initial goal documentation         Plan:  The care management team will reach out to the patient again over the next 60 days.   Dr Livia Snellen 07/2020 Care guide referral to help with Rx assistance  Chong Sicilian, BSN, RN-BC [place] [facility] [place] [facility] Direct Dial: [phone]

## 2020-02-24 DIAGNOSIS — J449 Chronic obstructive pulmonary disease, unspecified: Secondary | ICD-10-CM | POA: Diagnosis not present

## 2020-03-01 DIAGNOSIS — E119 Type 2 diabetes mellitus without complications: Secondary | ICD-10-CM | POA: Diagnosis not present

## 2020-03-01 DIAGNOSIS — H04123 Dry eye syndrome of bilateral lacrimal glands: Secondary | ICD-10-CM | POA: Diagnosis not present

## 2020-03-01 LAB — HM DIABETES EYE EXAM

## 2020-03-12 ENCOUNTER — Telehealth: Payer: Self-pay | Admitting: Hematology

## 2020-03-12 NOTE — Telephone Encounter (Signed)
Rescheduled 08/09 appointments to 08/18 per patient's request.

## 2020-03-15 ENCOUNTER — Inpatient Hospital Stay: Payer: Medicare Other

## 2020-03-15 ENCOUNTER — Inpatient Hospital Stay: Payer: Medicare Other | Admitting: Hematology

## 2020-03-26 DIAGNOSIS — J449 Chronic obstructive pulmonary disease, unspecified: Secondary | ICD-10-CM | POA: Diagnosis not present

## 2020-03-29 ENCOUNTER — Other Ambulatory Visit: Payer: Medicare Other

## 2020-03-29 ENCOUNTER — Ambulatory Visit: Payer: Medicare Other | Admitting: Hematology

## 2020-04-06 NOTE — Progress Notes (Signed)
HEMATOLOGY CLINIC NOTE Patient Care Team: Claretta Fraise, MD as PCP - General (Family Medicine) [name], MD as PCP - Cardiology (Cardiology) Sinda Du, MD as Consulting Physician (Pulmonary Disease) Teena Irani, MD (Inactive) as Consulting Physician (Gastroenterology) [name], MD as Consulting Physician (Cardiology) Ilean China, RN as Registered Nurse    CHIEF COMPLAINTS/PURPOSE OF CONSULTATION:  F/u for IDA  HISTORY OF PRESENTING ILLNESS:   Kimberly Obrien 59 y.o. female is here because of a referral from Dr. 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   The pt reports that she is doing well overall. She reports seeing a Hematologist first after having a PE on her right side, damaging the lower lobe in 1999. She reports having a second in 2003 with 3 small PE in her right upper lobe. She reports having been on estrogen containing birth control in 1999 which she was told likely led to her PE, and was smoking at the time. She reports taking a diet pill with hormones in 2003 when she had her second PE. She denies having genetic testing regarding these Pes. She reports taking Warfarin 6 months after the first PE. She reports having taken Coumadin from 2003 until 3 months ago when she stopped due to her GI bleeding.  She denies having had any other blood clots, through 4 pregnancies and a few surgeries.  She denies a FHx of blood clotting or bleeding problems. She reports being SOB when she is losing lots of blood. She reports having a blood transfusion on 05/07/17, and receiving two iron infusions in the last 4 months. She denies allergic reactions to IV iron, but does note stomach issues and drowsiness for a couple days following IV iron.   Dr. Oletta Lamas has been following her iron levels. She reports using an inhaler.  She reports that Dr. 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   Of note prior to the patient's visit, pt has had an Abdomen CT completed on 04/19/17 with results revealing Mild splenomegaly.  On 05/07/17 she had a colonoscopy with 4 polyps removed, and non-bleeding internal hemorrhoids and upper endoscopy (05/07/17) with Gastritis. Biopsied. Normal examined duodenum. Blood found in stool.  Most recent lab results (08/30/17) of CBC, CMP is as follows: all values are WNL except for Glucose at 128, Total bilirubin at 1.4, AST at 68, ALT at 64, MCH at 26.4, Plt at 136k, Neut % at 75.5%.   On review of systems, pt reports occasional SOB, blood in the stools, abdominal pains, and denies mouth ulcers, and any other symptoms.   On PMHx the pt reports IBS, Positive hemoccult card, RRR with murmur, abdominal pain, respiratory disease, migraines, vertigo, COPD, arthritis, DJD, abnormal heart rhythm, fatty liver, pulmonary embolism, pulmonary edema, tachycardia, Vit D deficiency, arthralgia, insomnia, fatigue, hyperlipidemia, anxiety, dysuria, and sleep apnea.  On Surgical Hx the pt reports back surgery x4 (1997, 2006, 2006, 2007), tubal ligation (1985), wrist surgery x2 (1995 x2), cholecystectomy (2001), colonoscopy x2 (2007, 2014), EGD x2 (2002, 2017), hysterectomy partial (2009), rt eye cataract (2013), and cardiac catheterization (2015). On Social Hx the pt reports quitting smoking in 04/18/12 after 30 years, no ETOH use, and being occupationally disabled.    INTERVAL HISTORY  Kimberly Obrien is here for management and evaluation of her iron deficiency anemia. The patient's last visit with [place] was on 09/15/2019. The pt  reports that she is doing well overall.  The pt reports that she had fever, chills, headache, and fatigue two days after her second and third IV Iron infusions. Pt is no longer following with Dr. Oletta Lamas for her autoimmune hepatitis because he retired. The last time that she  saw a Hepatologist was near the beginning of the year. She has episodes of abdominal pain, bloating, and abnormal bowel movements. Pt continues having intermittent black stools. She has no known allergy to Benadryl.   Lab results today (04/07/20) of CBC w/diff and CMP is as follows: all values are WNL except for PLT at 76K, Glucose at 123, Total Protein at 6.3, Total Bilirubin at 2.4. 04/07/2020 Ferritin at 31 04/07/2020 Iron Panel is as follows: Iron at 79, TIBC at 420, Sat Ratios at 19, UIBC at 341  On review of systems, pt reports abdominal pain/bloating, black stools and denies any other symptoms.   MEDICAL HISTORY:  Past Medical History:  Diagnosis Date  . Anemia   . Anticoagulation monitoring by pharmacist   . Arthritis   . Asthma   . Autoimmune hepatitis ([place])   . Blood transfusion without reported diagnosis   . Cataract   . COPD (chronic obstructive pulmonary disease) ([place])   . Diabetes mellitus without complication ([place])   . Dyspnea    exertion  . Elevated hemoglobin A1c 03/01/2018  . History of pulmonary embolism 02/04/2013  . Hyperlipidemia   . PE (pulmonary embolism)   . Sleep apnea     SURGICAL HISTORY: Past Surgical History:  Procedure Laterality Date  . ABDOMINAL HYSTERECTOMY    . CATARACT EXTRACTION, BILATERAL Bilateral   . CHOLECYSTECTOMY    . COLONOSCOPY WITH PROPOFOL N/A 05/07/2017   Procedure: COLONOSCOPY WITH PROPOFOL;  Surgeon: Laurence Spates, MD;  Location: [facility] END[facility]COPY;  Service: Endoscopy;  Laterality: N/A;  . ESOPHAGOGASTRODUODENOSCOPY (EGD) WITH PROPOFOL N/A 05/07/2017   Procedure: ESOPHAGOGASTRODUODENOSCOPY (EGD) WITH PROPOFOL;  Surgeon: Laurence Spates, MD;  Location: [facility];  Service: Endoscopy;  Laterality: N/A;  . EYE SURGERY     Cataracts  . INCONTINENCE SURGERY  2009  . IR TRANSCATHETER BX  03/15/2018  . IR VENOGRAM HEPATIC W HEMODYNAMIC EVALUATION  03/15/2018  . LEFT AND RIGHT HEART CATHETERIZATION WITH CORONARY ANGIOGRAM N/A 05/13/2014    Procedure: LEFT AND RIGHT HEART CATHETERIZATION WITH CORONARY ANGIOGRAM;  Surgeon: [name], MD;  Location: [facility] CATH LAB;  Service: Cardiovascular;  Laterality: N/A;  . SPINE SURGERY     L5 S1 fusion  . TUBAL LIGATION    . WRIST SURGERY Right    Had surgery twice to shave bone for blood circulation improvement.    SOCIAL HISTORY: Social History   Socioeconomic History  . Marital status: Married    Spouse name: Shanon Brow  . Number of children: 4  . Years of education: GED  . Highest education level: GED or equivalent  Occupational History  . Occupation: Unemployed  Tobacco Use  . Smoking status: Former Smoker    Packs/day: 2.00    Years: 50.00    Pack years: 100.00    Types: Cigarettes    Start date: 08/22/1977    Quit date: 04/21/2012    Years since quitting: 7.9  . Smokeless tobacco: Never Used  Vaping Use  . Vaping Use: Never used  Substance and Sexual Activity  . Alcohol use: No  . Drug use: No  . Sexual activity: Not Currently    Birth control/protection: Surgical  Other Topics Concern  . Not  on file  Social History Narrative  . Not on file   Social Determinants of Health   Financial Resource Strain: Low Risk   . Difficulty of Paying Living Expenses: Not very hard  Food Insecurity: No Food Insecurity  . Worried About Charity fundraiser in the Last Year: Never true  . Ran Out of Food in the Last Year: Never true  Transportation Needs: No Transportation Needs  . Lack of Transportation (Medical): No  . Lack of Transportation (Non-Medical): No  Physical Activity: Inactive  . Days of Exercise per Week: 0 days  . Minutes of Exercise per Session: 0 min  Stress: Stress Concern Present  . Feeling of Stress : To some extent  Social Connections: Moderately Integrated  . Frequency of Communication with Friends and Family: More than three times a week  . Frequency of Social Gatherings with Friends and Family: More than three times a week  . Attends Religious  Services: More than 4 times per year  . Active Member of Clubs or Organizations: No  . Attends Archivist Meetings: Never  . Marital Status: Married  Human resources officer Violence: Not At Risk  . Fear of Current or Ex-Partner: No  . Emotionally Abused: No  . Physically Abused: No  . Sexually Abused: No    FAMILY HISTORY: Family History  Problem Relation Age of Onset  . Cirrhosis Mother   . Hyperlipidemia Father   . Heart failure Father   . Bipolar disorder Father   . Obesity Daughter   . Hypertension Brother   . Heart attack Neg Hx   . Miscarriages / Stillbirths Neg Hx     ALLERGIES:  is allergic to aciphex [rabeprazole sodium], fluconazole, lansoprazole, sucralfate, vancomycin, claritin [loratadine], crestor [rosuvastatin], doxycycline, ibuprofen, lipitor [atorvastatin], macrobid [nitrofurantoin macrocrystal], metoprolol, metronidazole, nsaids, omeprazole, rabeprazole, and septra [sulfamethoxazole-trimethoprim].  MEDICATIONS:  Current Outpatient Medications  Medication Sig Dispense Refill  . Accu-Chek Softclix Lancets lancets Test BS BID and prn Dx E11.9 200 each 3  . acetaminophen (TYLENOL) 500 MG tablet Take 500 mg by mouth every 6 (six) hours as needed.    Marland Kitchen albuterol (VENTOLIN HFA) 108 (90 Base) MCG/ACT inhaler Inhale 2 puffs into the lungs every 6 (six) hours as needed for wheezing or shortness of breath. 18 g 1  . Blood Glucose Monitoring Suppl (ACCU-CHEK GUIDE) w/Device KIT 1 each by Does not apply route 2 (two) times daily. Dx E11.9 1 kit 0  . budesonide-formoterol (SYMBICORT) 80-4.5 MCG/ACT inhaler Inhale 2 puffs into the lungs in the morning and at bedtime. 1 Inhaler 12  . citalopram (CELEXA) 20 MG tablet Take 1 tablet (20 mg total) by mouth daily. In the evening, for anxiety 30 tablet 3  . fluticasone (FLONASE) 50 MCG/ACT nasal spray Place 2 sprays into both nostrils daily. 16 g 11  . glucose blood (ACCU-CHEK GUIDE) test strip Test BS BID Dx E11.9 200 each 3  .  ipratropium-albuterol (DUONEB) 0.5-2.5 (3) MG/3ML SOLN Take 3 mLs by nebulization every 6 (six) hours as needed. 360 mL 1  . metFORMIN (GLUCOPHAGE-XR) 750 MG 24 hr tablet Take 2 tablets (1,500 mg total) by mouth daily with breakfast. 180 tablet 1  . traZODone (DESYREL) 150 MG tablet Use from 1/3 to 1 tablet nightly as needed for sleep. 30 tablet 5  . triamcinolone cream (KENALOG) 0.1 % Apply 1 application topically 3 (three) times daily. Avoid face and genitalia 45 g 0   No current facility-administered medications for this visit.  REVIEW OF SYSTEMS:  A 10+ POINT REVIEW OF SYSTEMS WAS OBTAINED including neurology, dermatology, psychiatry, cardiac, respiratory, lymph, extremities, GI, GU, Musculoskeletal, constitutional, breasts, reproductive, HEENT.  All pertinent positives are noted in the HPI.  All others are negative.   PHYSICAL EXAMINATION:  Vitals:   04/07/20 1002  BP: (!) 120/52  Pulse: 77  Resp: 18  Temp: (!) 97.5 F (36.4 C)  SpO2: 97%   Filed Weights   04/07/20 1002  Weight: 208 lb 14.4 oz (94.8 kg)     GENERAL:alert, in no acute distress and comfortable SKIN: no acute rashes, no significant lesions EYES: conjunctiva are pink and non-injected, sclera anicteric OROPHARYNX: MMM, no exudates, no oropharyngeal erythema or ulceration NECK: supple, no JVD LYMPH:  no palpable lymphadenopathy in the cervical, axillary or inguinal regions LUNGS: clear to auscultation b/l with normal respiratory effort HEART: regular rate & rhythm ABDOMEN:  normoactive bowel sounds , non tender, not distended. No palpable hepatosplenomegaly.  Extremity: no pedal edema PSYCH: alert & oriented x 3 with fluent speech NEURO: no focal motor/sensory deficits  LABORATORY DATA:  I have reviewed the data as listed Recent Results (from the past 2160 hour(s))  Bayer DCA Hb A1c Waived     Status: None   Collection Time: 02/12/20  1:01 PM  Result Value Ref Range   HB A1C (BAYER DCA - WAIVED) 5.4  <7.0 %    Comment:                                       Diabetic Adult            <7.0                                       Healthy Adult        4.3 - 5.7                                                           (DCCT/NGSP) American Diabetes Association's Summary of Glycemic Recommendations for Adults with Diabetes: Hemoglobin A1c <7.0%. More stringent glycemic goals (A1c <6.0%) may further reduce complications at the cost of increased risk of hypoglycemia.   CBC with Differential/Platelet     Status: Abnormal   Collection Time: 02/12/20  1:13 PM  Result Value Ref Range   WBC 5.6 3.4 - 10.8 x10E3/uL   RBC 4.70 3.77 - 5.28 x10E6/uL   Hemoglobin 14.7 11.1 - 15.9 g/dL   Hematocrit 41.3 34.0 - 46.6 %   MCV 88 79 - 97 fL   MCH 31.3 26.6 - 33.0 pg   MCHC 35.6 31 - 35 g/dL   RDW 12.7 11.7 - 15.4 %   Platelets 92 (LL) 150 - 450 x10E3/uL   Neutrophils 73 Not Estab. %   Lymphs 18 Not Estab. %   Monocytes 7 Not Estab. %   Eos 1 Not Estab. %   Basos 1 Not Estab. %   Neutrophils Absolute 4.1 1 - 7 x10E3/uL   Lymphocytes Absolute 1.0 0 - 3 x10E3/uL   Monocytes Absolute 0.4 0 - 0 x10E3/uL   EOS (ABSOLUTE) 0.0  0.0 - 0.4 x10E3/uL   Basophils Absolute 0.0 0 - 0 x10E3/uL   Immature Granulocytes 0 Not Estab. %   Immature Grans (Abs) 0.0 0.0 - 0.1 x10E3/uL   Hematology Comments: Note:     Comment: Verified by microscopic examination.  CMP14+EGFR     Status: Abnormal   Collection Time: 02/12/20  1:13 PM  Result Value Ref Range   Glucose 110 (H) 65 - 99 mg/dL   BUN 8 6 - 24 mg/dL   Creatinine, Ser 0.59 0.57 - 1.00 mg/dL   GFR calc non Af Amer 101 >59 mL/min/1.73   GFR calc Af Amer 116 >59 mL/min/1.73    Comment: **[facility] currently reports eGFR in compliance with the current**   recommendations of the [employer]. [facility] will   update reporting as new guidelines are published from the NKF-ASN   Task force.    BUN/Creatinine Ratio 14 9 - 23   Sodium 142 134 - 144 mmol/L    Potassium 3.8 3.5 - 5.2 mmol/L   Chloride 106 96 - 106 mmol/L   CO2 24 20 - 29 mmol/L   Calcium 8.8 8.7 - 10.2 mg/dL   Total Protein 6.2 6.0 - 8.5 g/dL   Albumin 3.8 3.8 - 4.9 g/dL   Globulin, Total 2.4 1.5 - 4.5 g/dL   Albumin/Globulin Ratio 1.6 1.2 - 2.2   Bilirubin Total 2.6 (H) 0.0 - 1.2 mg/dL   Alkaline Phosphatase 88 48 - 121 IU/L   AST 42 (H) 0 - 40 IU/L   ALT 32 0 - 32 IU/L  Lipid panel     Status: Abnormal   Collection Time: 02/12/20  1:13 PM  Result Value Ref Range   Cholesterol, Total 156 100 - 199 mg/dL   Triglycerides 152 (H) 0 - 149 mg/dL   HDL 55 >39 mg/dL   VLDL Cholesterol Cal 26 5 - 40 mg/dL   LDL Chol Calc (NIH) 75 0 - 99 mg/dL   Chol/HDL Ratio 2.8 0.0 - 4.4 ratio    Comment:                                   T. Chol/HDL Ratio                                             Men  Women                               1/2 Avg.Risk  3.4    3.3                                   Avg.Risk  5.0    4.4                                2X Avg.Risk  9.6    7.1                                3X Avg.Risk 23.4   11.0   HM DIABETES EYE EXAM     Status:  None   Collection Time: 03/01/20 12:00 AM  Result Value Ref Range   HM Diabetic Eye Exam No Retinopathy No Retinopathy    Comment: Celestia Khat, OD  Iron and TIBC     Status: Abnormal   Collection Time: 04/07/20  9:18 AM  Result Value Ref Range   Iron 79 41 - 142 ug/dL   TIBC 420 236 - 444 ug/dL   Saturation Ratios 19 (L) 21 - 57 %   UIBC 341 120 - 384 ug/dL    Comment: Performed at [facility], [place] [address] [place], [place] [address]  Ferritin     Status: None   Collection Time: 04/07/20  9:18 AM  Result Value Ref Range   Ferritin 31 11 - 307 ng/mL    Comment: Performed at [facility], [place] [address] [place], [place] [address]  CMP ([place] only)     Status: Abnormal   Collection Time: 04/07/20  9:19 AM  Result Value Ref Range   Sodium 142 135 - 145 mmol/L    Potassium 3.8 3.5 - 5.1 mmol/L   Chloride 108 98 - 111 mmol/L   CO2 26 22 - 32 mmol/L   Glucose, Bld 123 (H) 70 - 99 mg/dL    Comment: Glucose reference range applies only to samples taken after fasting for at least 8 hours.   BUN 9 6 - 20 mg/dL   Creatinine 0.61 0.44 - 1.00 mg/dL   Calcium 9.5 8.9 - 10.3 mg/dL   Total Protein 6.3 (L) 6.5 - 8.1 g/dL   Albumin 3.5 3.5 - 5.0 g/dL   AST 33 15 - 41 U/L   ALT 25 0 - 44 U/L   Alkaline Phosphatase 74 38 - 126 U/L   Total Bilirubin 2.4 (H) 0.3 - 1.2 mg/dL   GFR, Est Non Af Am >60 >60 mL/min   GFR, Est AFR Am >60 >60 mL/min   Anion gap 8 5 - 15    Comment: Performed at [facility], [place] [address], [place], [place] [address]  CBC with Differential/Platelet     Status: Abnormal   Collection Time: 04/07/20  9:19 AM  Result Value Ref Range   WBC 4.2 4.0 - 10.5 K/uL   RBC 4.63 3.87 - 5.11 MIL/uL   Hemoglobin 13.4 12.0 - 15.0 g/dL   HCT 39.9 36 - 46 %   MCV 86.2 80.0 - 100.0 fL   MCH 28.9 26.0 - 34.0 pg   MCHC 33.6 30.0 - 36.0 g/dL   RDW 13.6 11.5 - 15.5 %   Platelets 76 (L) 150 - 400 K/uL   nRBC 0.0 0.0 - 0.2 %   Neutrophils Relative % 71 %   Neutro Abs 3.0 1.7 - 7.7 K/uL   Lymphocytes Relative 21 %   Lymphs Abs 0.9 0.7 - 4.0 K/uL   Monocytes Relative 6 %   Monocytes Absolute 0.3 0 - 1 K/uL   Eosinophils Relative 1 %   Eosinophils Absolute 0.1 0 - 0 K/uL   Basophils Relative 1 %   Basophils Absolute 0.0 0 - 0 K/uL   Immature Granulocytes 0 %   Abs Immature Granulocytes 0.01 0.00 - 0.07 K/uL    Comment: Performed at [facility], [place] [address] [place], [place] [address]   . Lab Results  Component Value Date   IRON 79 04/07/2020   TIBC 420 04/07/2020   IRONPCTSAT 19 (L) 04/07/2020   (  Iron and TIBC)  Lab Results  Component Value Date   FERRITIN 31 04/07/2020   . CBC Latest Ref Rng & Units 04/07/2020 02/12/2020 11/11/2019  WBC 4.0 - 10.5 K/uL 4.2 5.6 5.1  Hemoglobin 12.0 -  15.0 g/dL 13.4 14.7 15.4  Hematocrit 36 - 46 % 39.9 41.3 44.6  Platelets 150 - 400 K/uL 76(L) 92(LL) 88(LL)   . CMP Latest Ref Rng & Units 04/07/2020 02/12/2020 11/11/2019  Glucose 70 - 99 mg/dL 123(H) 110(H) 93  BUN 6 - 20 mg/dL _0 Creatinine 0.44 - 1.00 mg/dL 0.61 0.59 0.52(L)  Sodium 135 - 145 mmol/L 142 142 146(H)  Potassium 3.5 - 5.1 mmol/L 3.8 3.8 3.3(L)  Chloride 98 - 111 mmol/L 108 106 107(H)  CO2 22 - 32 mmol/L _1 Calcium 8.9 - 10.3 mg/dL 9.5 8.8 8.7  Total Protein 6.5 - 8.1 g/dL 6.3(L) 6.2 5.9(L)  Total Bilirubin 0.3 - 1.2 mg/dL 2.4(H) 2.6(H) 2.8(H)  Alkaline Phos 38 - 126 U/L 74 88 107  AST 15 - 41 U/L 33 42(H) 43(H)  ALT 0 - 44 U/L 25 32 34(H)      Diagnosis 05/07/17 1. Stomach, biopsy, antrum - REACTIVE GASTROPATHY. - THERE IS NO EVIDENCE OF HELICOBACTER PYLORI, DYSPLASIA OR MALIGNANCY. - SEE COMMENT. 2. Colon, polyp(s), cecal x 2 - TUBULAR ADENOMA(S). - HIGH GRADE DYSPLASIA IS NOT IDENTIFIED. 3. Colon, polyp(s), descending x 2 - TUBULAR ADENOMA(S). - HIGH GRADE DYSPLASIA IS NOT IDENTIFIED. Microscopic Comment 1. This pattern can be associated with non-steroidal anti-inflammatory drugs (NSAIDS), alcohol or with other causes of chemical gastropathy. Helicobacter pylori are not identified with Warthin-Starry stain.   ASSESSMENT & PLAN:   59 y.o. female with   1. History of recurrent Pulmonary Embolisms Her previous events appears to have been triggered by taking estrogen containing hormones and smoking. No overt Fhx of thrombophilias and no previous VTE with several pregnancies and surgeries - suggest against significant hereditary thrombophilia. Factor V Leiden was negative, Prothrombin gene mutation neg, APLA Ab panel - unrevealing.  PLAN:  -patient continues to have issues with GI bleeding and is off blood thinners as a result -no clear justification for restarting anticoagulation till her GI bleeding is completely control and then she could be  considered for prophylactic ASA.  2. GI bleeding -recent w/u done -05/07/17 Upper Endoscopy and Colonoscopy show gastritis with 4 large colon polyps which were removed which were found to be tubular adenomas.    3. Autoimmune hepatitis 04/08/18 US Abdomen revealed Hepatic steatosis without other significant abnormality. -continue f/u with Dr Oletta Lamas for further evaluation and mx of GI bleeding.  4. Iron deficiency Anemia related to GI bleeding. Has needed previous IV Iron and PRBC transfusions. . 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 -Would consider treatment thrombocytopenia if her PLT <50K. -Advised pt that we can try different IV Iron preparations, but she has a lot of allergies, which could limit our options.  -If anemia worsens due to ongoing GI bleeding will attempt to give a different preparation of IV Iron with premedications. -Advised pt that poorly controlled autoimmune hepatitis can affect blood & PLT counts. This needs to be her primary concern.  -Recommend  pt disuss urgent referral to Hepatologist & Gastroenterologist with Dr. Livia Snellen.  -Will continue to monitor Hgb and Iron levels every 6 months -Will see back in 6 months with labs    FOLLOW UP: RTC with Dr Irene Limbo with labs in 6 months   The total time spent in the appt was 25 minutes and more than 50% was on counseling and direct patient cares.  All of the patient's questions were answered with apparent satisfaction. The patient knows to call the clinic with any problems, questions or concerns.   Sullivan Lone MD [place] AAHIVMS [facility] Hematology/Oncology Physician [facility]  (Office):       [phone] (Work cell):   [phone] (Fax):           [phone]  I, Yevette Edwards, am acting as a scribe for Dr. Sullivan Lone.   .I have reviewed the above documentation for accuracy and completeness, and I agree with the above. Brunetta Genera MD

## 2020-04-07 ENCOUNTER — Other Ambulatory Visit: Payer: Self-pay

## 2020-04-07 ENCOUNTER — Inpatient Hospital Stay: Payer: Medicare Other | Admitting: Hematology

## 2020-04-07 ENCOUNTER — Inpatient Hospital Stay: Payer: Medicare Other | Attending: Hematology

## 2020-04-07 VITALS — BP 120/52 | HR 77 | Temp 97.5°F | Resp 18 | Ht 64.0 in | Wt 208.9 lb

## 2020-04-07 DIAGNOSIS — D696 Thrombocytopenia, unspecified: Secondary | ICD-10-CM | POA: Insufficient documentation

## 2020-04-07 DIAGNOSIS — Z86711 Personal history of pulmonary embolism: Secondary | ICD-10-CM | POA: Diagnosis not present

## 2020-04-07 DIAGNOSIS — D500 Iron deficiency anemia secondary to blood loss (chronic): Secondary | ICD-10-CM | POA: Diagnosis not present

## 2020-04-07 LAB — CBC WITH DIFFERENTIAL/PLATELET
Abs Immature Granulocytes: 0.01 10*3/uL (ref 0.00–0.07)
Basophils Absolute: 0 10*3/uL (ref 0.0–0.1)
Basophils Relative: 1 %
Eosinophils Absolute: 0.1 10*3/uL (ref 0.0–0.5)
Eosinophils Relative: 1 %
HCT: 39.9 % (ref 36.0–46.0)
Hemoglobin: 13.4 g/dL (ref 12.0–15.0)
Immature Granulocytes: 0 %
Lymphocytes Relative: 21 %
Lymphs Abs: 0.9 10*3/uL (ref 0.7–4.0)
MCH: 28.9 pg (ref 26.0–34.0)
MCHC: 33.6 g/dL (ref 30.0–36.0)
MCV: 86.2 fL (ref 80.0–100.0)
Monocytes Absolute: 0.3 10*3/uL (ref 0.1–1.0)
Monocytes Relative: 6 %
Neutro Abs: 3 10*3/uL (ref 1.7–7.7)
Neutrophils Relative %: 71 %
Platelets: 76 10*3/uL — ABNORMAL LOW (ref 150–400)
RBC: 4.63 MIL/uL (ref 3.87–5.11)
RDW: 13.6 % (ref 11.5–15.5)
WBC: 4.2 10*3/uL (ref 4.0–10.5)
nRBC: 0 % (ref 0.0–0.2)

## 2020-04-07 LAB — IRON AND TIBC
Iron: 79 ug/dL (ref 41–142)
Saturation Ratios: 19 % — ABNORMAL LOW (ref 21–57)
TIBC: 420 ug/dL (ref 236–444)
UIBC: 341 ug/dL (ref 120–384)

## 2020-04-07 LAB — CMP (CANCER CENTER ONLY)
ALT: 25 U/L (ref 0–44)
AST: 33 U/L (ref 15–41)
Albumin: 3.5 g/dL (ref 3.5–5.0)
Alkaline Phosphatase: 74 U/L (ref 38–126)
Anion gap: 8 (ref 5–15)
BUN: 9 mg/dL (ref 6–20)
CO2: 26 mmol/L (ref 22–32)
Calcium: 9.5 mg/dL (ref 8.9–10.3)
Chloride: 108 mmol/L (ref 98–111)
Creatinine: 0.61 mg/dL (ref 0.44–1.00)
GFR, Est AFR Am: >60 mL/min (ref >60)
GFR, Estimated: >60 mL/min (ref >60)
Glucose, Bld: 123 mg/dL — ABNORMAL HIGH (ref 70–99)
Potassium: 3.8 mmol/L (ref 3.5–5.1)
Sodium: 142 mmol/L (ref 135–145)
Total Bilirubin: 2.4 mg/dL — ABNORMAL HIGH (ref 0.3–1.2)
Total Protein: 6.3 g/dL — ABNORMAL LOW (ref 6.5–8.1)

## 2020-04-07 LAB — FERRITIN: Ferritin: 31 ng/mL (ref 11–307)

## 2020-04-08 ENCOUNTER — Telehealth: Payer: Self-pay | Admitting: Hematology

## 2020-04-08 NOTE — Telephone Encounter (Signed)
Scheduled appointment per 8/18 los. Patient is aware of appointment date and time.

## 2020-04-09 ENCOUNTER — Telehealth: Payer: Self-pay | Admitting: Gastroenterology

## 2020-04-09 ENCOUNTER — Telehealth: Payer: Self-pay | Admitting: Family Medicine

## 2020-04-09 NOTE — Telephone Encounter (Signed)
Okay to book for routine new patient office consult. Thanks

## 2020-04-09 NOTE — Telephone Encounter (Signed)
Pt states she has had blood in her stool since her metformin was increased back to 2 a day. She had this problem in the past and only took 1 metformin daily. She saw some blood on her pillow this morning and wants to know what to do.Pt denies being dizzy, lightheaded or feeling weak at the moment. She has an appt with her new GI doctor but it isn't till November. Please advise.

## 2020-04-09 NOTE — Telephone Encounter (Signed)
Hey Dr. Havery Moros, patient is wanting to transfer care from Dr. Oletta Lamas to [place]. She is requesting you as her doctor, states Dr. Oletta Lamas is retiring. Last seen beginning of this year. Her records are in epic. Please review and advise if you will accept her as patient.

## 2020-04-12 ENCOUNTER — Encounter: Payer: Self-pay | Admitting: Gastroenterology

## 2020-04-12 NOTE — Telephone Encounter (Addendum)
Pt states she has cut back to one metformin and her stomach pains have gotten better. Advised pt of provider feedback but she states she looked it up and that it is a rare side effect of Metformin to have GI bleed. Pt states she is just going to stay on one for now but did agree to schedule appt with Dr Livia Snellen to have labs. Pt scheduled for 04/29/20 at 12:55.

## 2020-04-12 NOTE — Telephone Encounter (Signed)
Pt NTBS, metformin should not cause GI bleed.

## 2020-04-12 NOTE — Telephone Encounter (Signed)
Patient scheduled for 10/28 with Dr. Havery Moros.

## 2020-04-14 ENCOUNTER — Telehealth: Payer: Self-pay | Admitting: Family Medicine

## 2020-04-14 NOTE — Telephone Encounter (Signed)
    SF 04/14/2020   Name: Kimberly Obrien   MRN: [id number]   DOB: [birth year]   AGE: 59 y.o.   GENDER: female   PCP Claretta Fraise, MD.   Called patient regarding referral. Kimberly Obrien stated that she is no longer taking Symbicort because of the side effects therefore she is not interested in assistance with purchasing the medication. No additional needs at this time.  Closing referral pending any other needs of patient.    [place], Care Management Phone: [phone] Email: [email or web address]

## 2020-04-15 ENCOUNTER — Ambulatory Visit (INDEPENDENT_AMBULATORY_CARE_PROVIDER_SITE_OTHER): Payer: Medicare Other | Admitting: *Deleted

## 2020-04-15 DIAGNOSIS — K754 Autoimmune hepatitis: Secondary | ICD-10-CM

## 2020-04-15 DIAGNOSIS — D500 Iron deficiency anemia secondary to blood loss (chronic): Secondary | ICD-10-CM

## 2020-04-15 DIAGNOSIS — E119 Type 2 diabetes mellitus without complications: Secondary | ICD-10-CM

## 2020-04-15 DIAGNOSIS — D696 Thrombocytopenia, unspecified: Secondary | ICD-10-CM | POA: Insufficient documentation

## 2020-04-15 NOTE — Chronic Care Management (AMB) (Signed)
Chronic Care Management   Follow Up Note   04/15/2020 Name: Kimberly Obrien MRN: [id number] DOB: 1960/12/10  Referred by: Claretta Fraise, MD Reason for referral : Chronic Care Management (RN follow-up)   Kimberly Obrien is a 59 y.o. year old female who is a primary care patient of Stacks, Cletus Gash, MD. The CCM team was consulted for assistance with chronic disease management and care coordination needs.    Review of patient status, including review of consultants reports, relevant laboratory and other test results, and collaboration with appropriate care team members and the patient's provider was performed as part of comprehensive patient evaluation and provision of chronic care management services.    SDOH (Social Determinants of Health) assessments performed: No See Care Plan activities for detailed interventions related to [facility])     Outpatient Encounter Medications as of 04/15/2020  Medication Sig  . Accu-Chek Softclix Lancets lancets Test BS BID and prn Dx E11.9  . acetaminophen (TYLENOL) 500 MG tablet Take 500 mg by mouth every 6 (six) hours as needed.  Marland Kitchen albuterol (VENTOLIN HFA) 108 (90 Base) MCG/ACT inhaler Inhale 2 puffs into the lungs every 6 (six) hours as needed for wheezing or shortness of breath.  . Blood Glucose Monitoring Suppl (ACCU-CHEK GUIDE) w/Device KIT 1 each by Does not apply route 2 (two) times daily. Dx E11.9  . budesonide-formoterol (SYMBICORT) 80-4.5 MCG/ACT inhaler Inhale 2 puffs into the lungs in the morning and at bedtime.  . citalopram (CELEXA) 20 MG tablet Take 1 tablet (20 mg total) by mouth daily. In the evening, for anxiety  . fluticasone (FLONASE) 50 MCG/ACT nasal spray Place 2 sprays into both nostrils daily.  Marland Kitchen glucose blood (ACCU-CHEK GUIDE) test strip Test BS BID Dx E11.9  . ipratropium-albuterol (DUONEB) 0.5-2.5 (3) MG/3ML SOLN Take 3 mLs by nebulization every 6 (six) hours as needed.  . metFORMIN (GLUCOPHAGE-XR) 750 MG 24 hr tablet Take 2  tablets (1,500 mg total) by mouth daily with breakfast.  . traZODone (DESYREL) 150 MG tablet Use from 1/3 to 1 tablet nightly as needed for sleep.  Marland Kitchen triamcinolone cream (KENALOG) 0.1 % Apply 1 application topically 3 (three) times daily. Avoid face and genitalia   No facility-administered encounter medications on file as of 04/15/2020.     Lab Results  Component Value Date   WBC 4.2 04/07/2020   HGB 13.4 04/07/2020   HCT 39.9 04/07/2020   MCV 86.2 04/07/2020   PLT 76 (L) 04/07/2020     RN Care Plan   .  "I need to see someone about bleeding" (pt-stated)        CARE PLAN ENTRY (see longitudinal plan of care for additional care plan information)  Current Barriers:  . Care Coordination needs related to episodes of bleeding in a patient with DM, autoimmune hepatitis, anemia, thrombocytopenia . Hx of PE . Hx of uterine removal. Per patient, ovaries and cervix left intact   Nurse Case Manager Clinical Goal(s):  Marland Kitchen Over the next 30 days, patient will keep all medical appointments . Over the next 30 days, patient will seek medical attention for any new or worsening symptoms and will seek emergency medical attention if necessary  Interventions:  . Inter-disciplinary care team collaboration (see longitudinal plan of care) . Chart reviewed including recent office notes and lab results . Discussed history of thrombocytopenia o Last platelet count was 76 on 04/07/20 . Discussed visit with hematologist . Reviewed medications and discussed her concern that Metormin was contributing to bleeding .  Discussed bleeding: o Red rectal bleeding. No black tarry stools. Hx of GI bleed and knows signs.  o One episode of red vaginal bleeding. Uterus removed in 2009. Cervix and ovaries intact per patient. Does not have a gyn but has someone in mind. o Episodes of brief epistaxis  . Reviewed and discussed upcoming appointment with PCP on 04/29/20 and GI 06/17/20 . Advised patient to seek medical care for  any new or worsening symptoms and seek emergency medical attention if necessary . Encouraged patient to schedule appt with gynecologist. Provided name and number of urogynecologist, [name], MD [phone], since she also complains of pelvic discomfort and chronic bladder issues since having hysterectomy and mesh placement in 2009.  Marland Kitchen RNCM will reach out to hematologist, Dr Irene Limbo, to update him on increased bleeding . Provided with RNCM contact number and encouraged to reach out as needed  Patient Self Care Activities:  . Performs ADL's independently . Performs IADL's independently  Initial goal documentation     .  Chronic Disease Management Needs   On track     CARE PLAN ENTRY (see longtitudinal plan of care for additional care plan information)  Current Barriers:  . Chronic Disease Management support, education, and care coordination needs related to COPD, DM, arthritis, HLD, GAD  Clinical Goal(s) related to COPD, DM, arthritis, HLD, GAD:  Over the next 60 days, patient will:  . Work with the care management team to address educational, disease management, and care coordination needs  . Begin or continue self health monitoring activities as directed today Measure and record cbg (blood glucose) 1 times daily . Call provider office for new or worsened signs and symptoms Blood glucose findings outside established parameters . Call care management team with questions or concerns . Verbalize basic understanding of patient centered plan of care established today  Interventions related to COPD, DM, arthritis, HLD, GAD:  . Evaluation of current treatment plans and patient's adherence to plan as established by provider . Assessed patient understanding of disease states . Assessed patient's education and care coordination needs . Provided disease specific education to patient  . Collaborated with appropriate clinical care team members regarding patient needs . Chart reviewed  including recent office note and lab results . Medications reviewed and discussed o Discontinued Symbicort o Only taking Metformin once daily . Reviewed upcoming appointments . Provided with RNCM contact number and encouraged to reach out as needed   Patient Self Care Activities related to COPD, DM, arthritis, HLD, GAD:  . Patient is able to perform ADLs and IADLs independently  Initial goal documentation and Please see past updates related to this goal by clicking on the "Past Updates" button in the selected goal          Plan:   The care management team will reach out to the patient again over the next 7 days.    Chong Sicilian, BSN, RN-BC Embedded Chronic Care Manager [facility] / [place] [facility] Direct Dial: [phone]

## 2020-04-15 NOTE — Patient Instructions (Signed)
Visit Information  Goals Addressed              This Visit's Progress     Patient Stated   .  "I need to see someone about bleeding" (pt-stated)        CARE PLAN ENTRY (see longitudinal plan of care for additional care plan information)  Current Barriers:  . Care Coordination needs related to episodes of bleeding in a patient with DM, autoimmune hepatitis, anemia, thrombocytopenia . Hx of PE . Hx of uterine removal. Per patient, ovaries and cervix left intact   Nurse Case Manager Clinical Goal(s):  Kimberly Kitchen Over the next 30 days, patient will keep all medical appointments . Over the next 30 days, patient will seek medical attention for any new or worsening symptoms and will seek emergency medical attention if necessary  Interventions:  . Inter-disciplinary care team collaboration (see longitudinal plan of care) . Chart reviewed including recent office notes and lab results . Discussed history of thrombocytopenia o Last platelet count was 76 on 04/07/20 . Discussed visit with hematologist . Reviewed medications and discussed her concern that Metormin was contributing to bleeding . Discussed bleeding: o Red rectal bleeding. No black tarry stools. Hx of GI bleed and knows signs.  o One episode of red vaginal bleeding. Uterus removed in 2009. Cervix and ovaries intact per patient. Does not have a gyn but has someone in mind. o Episodes of brief epistaxis  . Reviewed and discussed upcoming appointment with PCP on 04/29/20 and GI 06/17/20 . Advised patient to seek medical care for any new or worsening symptoms and seek emergency medical attention if necessary . Encouraged patient to schedule appt with gynecologist. Provided name and number of urogynecologist, Kimberly Pink, MD [phone], since she also complains of pelvic discomfort and chronic bladder issues since having hysterectomy and mesh placement in 2009.  Kimberly Kitchen RNCM will reach out to hematologist, Dr Irene Limbo, to update him on increased  bleeding . Provided with RNCM contact number and encouraged to reach out as needed  Patient Self Care Activities:  . Performs ADL's independently . Performs IADL's independently  Initial goal documentation       Other   .  Chronic Disease Management Needs   On track     CARE PLAN ENTRY (see longtitudinal plan of care for additional care plan information)  Current Barriers:  . Chronic Disease Management support, education, and care coordination needs related to COPD, DM, arthritis, HLD, GAD  Clinical Goal(s) related to COPD, DM, arthritis, HLD, GAD:  Over the next 60 days, patient will:  . Work with the care management team to address educational, disease management, and care coordination needs  . Begin or continue self health monitoring activities as directed today Measure and record cbg (blood glucose) 1 times daily . Call provider office for new or worsened signs and symptoms Blood glucose findings outside established parameters . Call care management team with questions or concerns . Verbalize basic understanding of patient centered plan of care established today  Interventions related to COPD, DM, arthritis, HLD, GAD:  . Evaluation of current treatment plans and patient's adherence to plan as established by provider . Assessed patient understanding of disease states . Assessed patient's education and care coordination needs . Provided disease specific education to patient  . Collaborated with appropriate clinical care team members regarding patient needs . Chart reviewed including recent office note and lab results . Medications reviewed and discussed o Discontinued Symbicort o Only taking Metformin once daily .  Reviewed upcoming appointments . Provided with RNCM contact number and encouraged to reach out as needed   Patient Self Care Activities related to COPD, DM, arthritis, HLD, GAD:  . Patient is able to perform ADLs and IADLs independently  Initial goal  documentation and Please see past updates related to this goal by clicking on the "Past Updates" button in the selected goal         Patient verbalizes understanding of instructions provided today.   Follow-up Plan The care management team will reach out to the patient again over the next 7 days.   Chong Sicilian, BSN, RN-BC Embedded Chronic Care Manager [facility] / [place] [facility] Direct Dial: [phone]

## 2020-04-16 ENCOUNTER — Ambulatory Visit: Payer: Medicare Other | Admitting: *Deleted

## 2020-04-16 DIAGNOSIS — D500 Iron deficiency anemia secondary to blood loss (chronic): Secondary | ICD-10-CM

## 2020-04-16 DIAGNOSIS — E119 Type 2 diabetes mellitus without complications: Secondary | ICD-10-CM | POA: Diagnosis not present

## 2020-04-16 DIAGNOSIS — D696 Thrombocytopenia, unspecified: Secondary | ICD-10-CM

## 2020-04-16 DIAGNOSIS — K754 Autoimmune hepatitis: Secondary | ICD-10-CM

## 2020-04-19 NOTE — Chronic Care Management (AMB) (Signed)
Chronic Care Management   Follow Up Note   04/16/2020 Name: Kimberly Obrien MRN: [id number] DOB: 07/04/1961  Referred by: Claretta Fraise, MD Reason for referral : Chronic Care Management (Care Coordination)   Kimberly Obrien is a 59 y.o. year old female who is a primary care patient of Stacks, Cletus Gash, MD. The CCM team was consulted for assistance with chronic disease management and care coordination needs.    Review of patient status, including review of consultants reports, relevant laboratory and other test results, and collaboration with appropriate care team members and the patient's provider was performed as part of comprehensive patient evaluation and provision of chronic care management services.    SDOH (Social Determinants of Health) assessments performed: No See Care Plan activities for detailed interventions related to [facility])     Outpatient Encounter Medications as of 04/16/2020  Medication Sig  . Accu-Chek Softclix Lancets lancets Test BS BID and prn Dx E11.9  . acetaminophen (TYLENOL) 500 MG tablet Take 500 mg by mouth every 6 (six) hours as needed.  Marland Kitchen albuterol (VENTOLIN HFA) 108 (90 Base) MCG/ACT inhaler Inhale 2 puffs into the lungs every 6 (six) hours as needed for wheezing or shortness of breath.  . Blood Glucose Monitoring Suppl (ACCU-CHEK GUIDE) w/Device KIT 1 each by Does not apply route 2 (two) times daily. Dx E11.9  . budesonide-formoterol (SYMBICORT) 80-4.5 MCG/ACT inhaler Inhale 2 puffs into the lungs in the morning and at bedtime.  . citalopram (CELEXA) 20 MG tablet Take 1 tablet (20 mg total) by mouth daily. In the evening, for anxiety  . fluticasone (FLONASE) 50 MCG/ACT nasal spray Place 2 sprays into both nostrils daily.  Marland Kitchen glucose blood (ACCU-CHEK GUIDE) test strip Test BS BID Dx E11.9  . ipratropium-albuterol (DUONEB) 0.5-2.5 (3) MG/3ML SOLN Take 3 mLs by nebulization every 6 (six) hours as needed.  . metFORMIN (GLUCOPHAGE-XR) 750 MG 24 hr tablet Take  2 tablets (1,500 mg total) by mouth daily with breakfast.  . traZODone (DESYREL) 150 MG tablet Use from 1/3 to 1 tablet nightly as needed for sleep.  Marland Kitchen triamcinolone cream (KENALOG) 0.1 % Apply 1 application topically 3 (three) times daily. Avoid face and genitalia   No facility-administered encounter medications on file as of 04/16/2020.    RN Care Plan   .  "I need to see someone about bleeding" (pt-stated)        CARE PLAN ENTRY (see longitudinal plan of care for additional care plan information)  Current Barriers:  . Care Coordination needs related to episodes of bleeding in a patient with DM, autoimmune hepatitis, anemia, thrombocytopenia . Hx of PE . Hx of uterine removal. Per patient, ovaries and cervix left intact   Nurse Case Manager Clinical Goal(s):  Marland Kitchen Over the next 30 days, patient will keep all medical appointments . Over the next 30 days, patient will seek medical attention for any new or worsening symptoms and will seek emergency medical attention if necessary  Interventions:  . Inter-disciplinary care team collaboration (see longitudinal plan of care) . Chart reviewed including recent office notes and lab results . Previously discussed bleeding with patient: o Red rectal bleeding. No black tarry stools. Hx of GI bleed and knows signs.  o One episode of red vaginal bleeding. Uterus removed in 2009. Cervix and ovaries intact per patient. Does not have a gyn but has someone in mind. o Episodes of brief epistaxis  . Reached out to Hematologist, Dr Irene Limbo through staff message o He recommended sooner  GI workup than 2 months out to r/o worsening liver disease that could be leading to other coagulopathies  . Reached out to [facility] at [phone] o Advised them of Dr Grier Mitts recommendation for a quicker evaluation but they were unable to schedule her any sooner than her existing appointment on 06/17/20 . Updated patient and asked if she wanted to try and see a different GI  group. She prefers to wait it out for now because she has not had any more bleeding episodes . Advised patient to seek medical care for any new or worsening symptoms and seek emergency medical attention if necessary . Previously encouraged patient to schedule appt with gynecologist. Provided name and number of urogynecologist, [name], MD [phone], since she also complains of pelvic discomfort and chronic bladder issues since having hysterectomy and mesh placement in 2009.  Marland Kitchen Provided with RNCM contact number and encouraged to reach out as needed  Patient Self Care Activities:  . Performs ADL's independently . Performs IADL's independently  Please see past updates related to this goal by clicking on the "Past Updates" button in the selected goal          Plan:   The care management team will reach out to the patient again over the next 15 days.    Chong Sicilian, BSN, RN-BC Embedded Chronic Care Manager [facility] / [place] [facility] Direct Dial: [phone]

## 2020-04-19 NOTE — Patient Instructions (Signed)
Visit Information  Goals Addressed              This Visit's Progress     Patient Stated   .  "I need to see someone about bleeding" (pt-stated)        CARE PLAN ENTRY (see longitudinal plan of care for additional care plan information)  Current Barriers:  . Care Coordination needs related to episodes of bleeding in a patient with DM, autoimmune hepatitis, anemia, thrombocytopenia . Hx of PE . Hx of uterine removal. Per patient, ovaries and cervix left intact   Nurse Case Manager Clinical Goal(s):  Marland Kitchen Over the next 30 days, patient will keep all medical appointments . Over the next 30 days, patient will seek medical attention for any new or worsening symptoms and will seek emergency medical attention if necessary  Interventions:  . Inter-disciplinary care team collaboration (see longitudinal plan of care) . Chart reviewed including recent office notes and lab results . Previously discussed bleeding with patient: o Red rectal bleeding. No black tarry stools. Hx of GI bleed and knows signs.  o One episode of red vaginal bleeding. Uterus removed in 2009. Cervix and ovaries intact per patient. Does not have a gyn but has someone in mind. o Episodes of brief epistaxis  . Reached out to Hematologist, Dr Irene Limbo through staff message o He recommended sooner GI workup than 2 months out to r/o worsening liver disease that could be leading to other coagulopathies  . Reached out to [place] at [phone] o Advised them of Dr Grier Mitts recommendation for a quicker evaluation but they were unable to schedule her any sooner than her existing appointment on 06/17/20 . Updated patient and asked if she wanted to try and see a different GI group. She prefers to wait it out for now because she has not had any more bleeding episodes . Advised patient to seek medical care for any new or worsening symptoms and seek emergency medical attention if necessary . Previously encouraged patient to schedule appt  with gynecologist. Provided name and number of urogynecologist, [name], MD [phone], since she also complains of pelvic discomfort and chronic bladder issues since having hysterectomy and mesh placement in 2009.  Marland Kitchen Provided with RNCM contact number and encouraged to reach out as needed  Patient Self Care Activities:  . Performs ADL's independently . Performs IADL's independently  Please see past updates related to this goal by clicking on the "Past Updates" button in the selected goal         Patient verbalizes understanding of instructions provided today.   Follow-up Plan The care management team will reach out to the patient again over the next 14 days.   Chong Sicilian, BSN, RN-BC Embedded Chronic Care Manager [facility] / [place] [facility] Direct Dial: [phone]

## 2020-04-26 DIAGNOSIS — J449 Chronic obstructive pulmonary disease, unspecified: Secondary | ICD-10-CM | POA: Diagnosis not present

## 2020-04-27 ENCOUNTER — Other Ambulatory Visit: Payer: Self-pay | Admitting: Family Medicine

## 2020-04-27 ENCOUNTER — Other Ambulatory Visit: Payer: Medicare Other

## 2020-04-27 ENCOUNTER — Telehealth: Payer: Self-pay | Admitting: Family Medicine

## 2020-04-27 ENCOUNTER — Other Ambulatory Visit: Payer: Self-pay

## 2020-04-27 ENCOUNTER — Telehealth: Payer: Medicare Other | Admitting: *Deleted

## 2020-04-27 DIAGNOSIS — D500 Iron deficiency anemia secondary to blood loss (chronic): Secondary | ICD-10-CM

## 2020-04-27 LAB — HEMOGLOBIN, FINGERSTICK: Hemoglobin: 15.2 g/dL (ref 11.1–15.9)

## 2020-04-27 NOTE — Telephone Encounter (Signed)
Bring her in. I'LL SEE HER NOW.

## 2020-04-27 NOTE — Telephone Encounter (Signed)
Patient wants order for hemogloben checked please advise if we can order for patient. Appt scheduled 09/09

## 2020-04-29 ENCOUNTER — Other Ambulatory Visit: Payer: Self-pay

## 2020-04-29 ENCOUNTER — Ambulatory Visit (INDEPENDENT_AMBULATORY_CARE_PROVIDER_SITE_OTHER): Payer: Medicare Other | Admitting: Family Medicine

## 2020-04-29 VITALS — BP 112/69 | HR 82 | Temp 98.4°F | Ht 64.0 in | Wt 209.0 lb

## 2020-04-29 DIAGNOSIS — H6593 Unspecified nonsuppurative otitis media, bilateral: Secondary | ICD-10-CM | POA: Diagnosis not present

## 2020-04-29 DIAGNOSIS — R319 Hematuria, unspecified: Secondary | ICD-10-CM | POA: Diagnosis not present

## 2020-04-29 DIAGNOSIS — R1013 Epigastric pain: Secondary | ICD-10-CM | POA: Diagnosis not present

## 2020-04-29 LAB — URINALYSIS, COMPLETE
Bilirubin, UA: NEGATIVE
Glucose, UA: NEGATIVE
Ketones, UA: NEGATIVE
Nitrite, UA: NEGATIVE
Protein,UA: NEGATIVE
Specific Gravity, UA: >1.03 — ABNORMAL HIGH (ref 1.005–1.030)
Urobilinogen, Ur: 0.2 mg/dL (ref 0.2–1.0)
pH, UA: 5 (ref 5.0–7.5)

## 2020-04-29 LAB — MICROSCOPIC EXAMINATION: RBC, Urine: NONE SEEN /hpf (ref 0–2)

## 2020-04-29 MED ORDER — AMOXICILLIN-POT CLAVULANATE 875-125 MG PO TABS: 1.0000 | ORAL_TABLET | Freq: Two times a day (BID) | ORAL | 0 refills | Status: DC

## 2020-04-29 NOTE — Progress Notes (Signed)
Subjective:  Patient ID: Kimberly Obrien, female    DOB: [birth year]  Age: 59 y.o. MRN: [id number]  CC: Dizziness, Ear Pain, and Hematuria   HPI Kimberly Obrien presents for intermittent flares right upper quadrant pain she describes as cramping.  She says the cramping hurts when she bends to her left.  She is also had diarrhea with this.  However the spells usually last less than 1 minute.  This is been ongoing for 2 or 3 years.  She feels dizzy since her ears have been congested for the last several days.  They feel like they are draining although no external drainage has been noted.  Depression screen [facility] 2/9 04/29/2020 02/12/2020 11/11/2019  Decreased Interest 0 0 0  Down, Depressed, Hopeless 0 0 0  PHQ - 2 Score 0 0 0  Altered sleeping - - -  Tired, decreased energy - - -  Change in appetite - - -  Feeling bad or failure about yourself  - - -  Trouble concentrating - - -  Moving slowly or fidgety/restless - - -  Suicidal thoughts - - -  PHQ-9 Score - - -  Some recent data might be hidden    History Zaylin has a past medical history of Anemia, Anticoagulation monitoring by pharmacist, Arthritis, Asthma, Autoimmune hepatitis ([place]), Blood transfusion without reported diagnosis, Cataract, COPD (chronic obstructive pulmonary disease) ([place]), Diabetes mellitus without complication ([place]), Dyspnea, Elevated hemoglobin A1c (03/01/2018), History of pulmonary embolism (02/04/2013), Hyperlipidemia, PE (pulmonary embolism), and Sleep apnea.   She has a past surgical history that includes Tubal ligation; Wrist surgery (Right); Cholecystectomy; Spine surgery; Abdominal hysterectomy; Eye surgery; left and right heart catheterization with coronary angiogram (N/A, 05/13/2014); Incontinence surgery (2009); Cataract extraction, bilateral (Bilateral); Esophagogastroduodenoscopy (egd) with propofol (N/A, 05/07/2017); Colonoscopy with propofol (N/A, 05/07/2017); IR Venogram Hepatic W Hemodynamic  Evaluation (03/15/2018); and IR Transcatheter BX (03/15/2018).   Her family history includes Bipolar disorder in her father; Cirrhosis in her mother; Heart failure in her father; Hyperlipidemia in her father; Hypertension in her brother; Obesity in her daughter.She reports that she quit smoking about 8 years ago. Her smoking use included cigarettes. She started smoking about 42 years ago. She has a 100.00 pack-year smoking history. She has never used smokeless tobacco. She reports that she does not drink alcohol and does not use drugs.    ROS Review of Systems  Constitutional: Negative.   HENT: Positive for congestion, ear discharge and ear pain.   Eyes: Negative for visual disturbance.  Respiratory: Negative for shortness of breath.   Cardiovascular: Negative for chest pain.  Gastrointestinal: Positive for abdominal pain and diarrhea.  Musculoskeletal: Negative for arthralgias.    Objective:  BP 112/69   Pulse 82   Temp 98.4 F (36.9 C) (Temporal)   Ht _0  (1.626 m)   Wt 209 lb (94.8 kg)   BMI 35.87 kg/m   BP Readings from Last 3 Encounters:  04/29/20 112/69  04/07/20 (!) 120/52  02/12/20 136/73    Wt Readings from Last 3 Encounters:  04/29/20 209 lb (94.8 kg)  04/07/20 208 lb 14.4 oz (94.8 kg)  02/12/20 208 lb 6 oz (94.5 kg)     Physical Exam Constitutional:      General: She is not in acute distress.    Appearance: She is well-developed.  HENT:     Head: Normocephalic and atraumatic.     Comments:   The right TM has significant fluid behind the eardrum.  This  is also noted on the left but somewhat lesser degree    Right Ear: External ear normal.     Left Ear: Tympanic membrane and external ear normal.     Nose: Nose normal.  Eyes:     Conjunctiva/sclera: Conjunctivae normal.     Pupils: Pupils are equal, round, and reactive to light.  Neck:     Thyroid: No thyromegaly.  Cardiovascular:     Rate and Rhythm: Normal rate and regular rhythm.     Heart sounds:  Normal heart sounds. No murmur heard.   Pulmonary:     Effort: Pulmonary effort is normal. No respiratory distress.     Breath sounds: Normal breath sounds. No wheezing or rales.  Abdominal:     General: Bowel sounds are normal. There is no distension.     Palpations: Abdomen is soft.     Tenderness: There is no abdominal tenderness.  Musculoskeletal:     Cervical back: Normal range of motion and neck supple.  Lymphadenopathy:     Cervical: No cervical adenopathy.  Skin:    General: Skin is warm and dry.  Neurological:     Mental Status: She is alert and oriented to person, place, and time.     Deep Tendon Reflexes: Reflexes are normal and symmetric.  Psychiatric:        Behavior: Behavior normal.        Thought Content: Thought content normal.        Judgment: Judgment normal.       Assessment & Plan:   Bellarae was seen today for dizziness, ear pain and hematuria.  Diagnoses and all orders for this visit:  Hematuria, unspecified type -     Urinalysis, Complete -     Urine Culture; Future -     Cancel: CBC with Differential/Platelet -     Microscopic Examination  Bilateral otitis media with effusion -     Cancel: CBC with Differential/Platelet -     amoxicillin-clavulanate (AUGMENTIN) 875-125 MG tablet; Take 1 tablet by mouth 2 (two) times daily. Take all of this medication  Epigastric pain -     Cancel: CBC with Differential/Platelet -     Cancel: CMP14+EGFR -     Cancel: Lipase -     CT Abdomen Pelvis W Contrast; Future       I have discontinued Mardene Celeste B. Cherne's citalopram. I am also having her start on amoxicillin-clavulanate. Additionally, I am having her maintain her Accu-Chek Guide, Accu-Chek Guide, Accu-Chek Softclix Lancets, acetaminophen, ipratropium-albuterol, albuterol, metFORMIN, traZODone, budesonide-formoterol, fluticasone, and triamcinolone cream.  Allergies as of 04/29/2020      Reactions   Aciphex [rabeprazole Sodium] Rash   Fluconazole  Rash, Hives   Lansoprazole Hives   Sucralfate Nausea Only   Vancomycin Diarrhea   Claritin [loratadine] Other (See Comments)   Tired   Crestor [rosuvastatin] Other (See Comments)   Elevated blood glucose and abdominal pain   Doxycycline Rash   Ibuprofen Other (See Comments)   Upset stomach   Lipitor [atorvastatin] Other (See Comments)   Myalgias, leg pain.  Problems with pancreas, sugars went up also.   Macrobid [nitrofurantoin Macrocrystal] Nausea And Vomiting   Metoprolol Nausea And Vomiting   Metronidazole Other (See Comments), Nausea Only   Dizziness, nausea, dry mouth and some shortness of breath   Nsaids Other (See Comments)   Upset stomach   Omeprazole    bloating   Rabeprazole Rash   Septra [sulfamethoxazole-trimethoprim] Nausea And Vomiting  Medication List       Accurate as of April 29, 2020 11:59 PM. If you have any questions, ask your nurse or doctor.        STOP taking these medications   citalopram 20 MG tablet Commonly known as: CELEXA Stopped by: Claretta Fraise, MD     TAKE these medications   Accu-Chek Guide test strip Generic drug: glucose blood Test BS BID Dx E11.9   Accu-Chek Guide w/Device Kit 1 each by Does not apply route 2 (two) times daily. Dx E11.9   Accu-Chek Softclix Lancets lancets Test BS BID and prn Dx E11.9   acetaminophen 500 MG tablet Commonly known as: TYLENOL Take 500 mg by mouth every 6 (six) hours as needed.   albuterol 108 (90 Base) MCG/ACT inhaler Commonly known as: VENTOLIN HFA Inhale 2 puffs into the lungs every 6 (six) hours as needed for wheezing or shortness of breath.   amoxicillin-clavulanate 875-125 MG tablet Commonly known as: AUGMENTIN Take 1 tablet by mouth 2 (two) times daily. Take all of this medication Started by: Claretta Fraise, MD   budesonide-formoterol 80-4.5 MCG/ACT inhaler Commonly known as: Symbicort Inhale 2 puffs into the lungs in the morning and at bedtime.   fluticasone 50 MCG/ACT  nasal spray Commonly known as: FLONASE Place 2 sprays into both nostrils daily.   ipratropium-albuterol 0.5-2.5 (3) MG/3ML Soln Commonly known as: DUONEB Take 3 mLs by nebulization every 6 (six) hours as needed.   metFORMIN 750 MG 24 hr tablet Commonly known as: GLUCOPHAGE-XR Take 2 tablets (1,500 mg total) by mouth daily with breakfast.   traZODone 150 MG tablet Commonly known as: DESYREL Use from 1/3 to 1 tablet nightly as needed for sleep.   triamcinolone cream 0.1 % Commonly known as: KENALOG Apply 1 application topically 3 (three) times daily. Avoid face and genitalia        Follow-up: Return if symptoms worsen or fail to improve.  Claretta Fraise, M.D.

## 2020-05-06 ENCOUNTER — Encounter: Payer: Self-pay | Admitting: Family Medicine

## 2020-05-20 ENCOUNTER — Encounter: Payer: Self-pay | Admitting: *Deleted

## 2020-05-21 ENCOUNTER — Other Ambulatory Visit: Payer: Self-pay | Admitting: Family Medicine

## 2020-05-24 ENCOUNTER — Other Ambulatory Visit: Payer: Self-pay

## 2020-05-24 ENCOUNTER — Ambulatory Visit (HOSPITAL_COMMUNITY)
Admission: RE | Admit: 2020-05-24 | Discharge: 2020-05-24 | Disposition: A | Discharge status: Home / Self Care | Payer: Medicare Other | Source: Ambulatory Visit | Attending: Family Medicine | Admitting: Family Medicine

## 2020-05-24 DIAGNOSIS — K7460 Unspecified cirrhosis of liver: Secondary | ICD-10-CM | POA: Diagnosis not present

## 2020-05-24 DIAGNOSIS — R1013 Epigastric pain: Secondary | ICD-10-CM | POA: Diagnosis not present

## 2020-05-24 DIAGNOSIS — N200 Calculus of kidney: Secondary | ICD-10-CM | POA: Diagnosis not present

## 2020-05-24 DIAGNOSIS — I10 Essential (primary) hypertension: Secondary | ICD-10-CM | POA: Diagnosis not present

## 2020-05-24 DIAGNOSIS — R197 Diarrhea, unspecified: Secondary | ICD-10-CM | POA: Diagnosis not present

## 2020-05-24 LAB — POCT I-STAT CREATININE: Creatinine, Ser: 0.5 mg/dL (ref 0.44–1.00)

## 2020-05-24 MED ORDER — IOHEXOL 300 MG/ML  SOLN
100.0000 mL | Freq: Once | INTRAMUSCULAR | Status: AC | PRN
  Administered 2020-05-24: 100 mL via INTRAVENOUS

## 2020-05-25 ENCOUNTER — Other Ambulatory Visit: Payer: Self-pay | Admitting: *Deleted

## 2020-05-25 DIAGNOSIS — F5104 Psychophysiologic insomnia: Secondary | ICD-10-CM

## 2020-05-25 DIAGNOSIS — E119 Type 2 diabetes mellitus without complications: Secondary | ICD-10-CM

## 2020-05-25 MED ORDER — METFORMIN HCL ER 750 MG PO TB24: 1500.0000 mg | ORAL_TABLET | Freq: Every day | ORAL | 0 refills | Status: DC

## 2020-05-25 MED ORDER — ALBUTEROL SULFATE HFA 108 (90 BASE) MCG/ACT IN AERS: 2.0000 | INHALATION_SPRAY | Freq: Four times a day (QID) | RESPIRATORY_TRACT | 0 refills | Status: DC | PRN

## 2020-05-25 MED ORDER — TRAZODONE HCL 150 MG PO TABS: ORAL_TABLET | ORAL | 1 refills | Status: DC

## 2020-05-26 DIAGNOSIS — J449 Chronic obstructive pulmonary disease, unspecified: Secondary | ICD-10-CM | POA: Diagnosis not present

## 2020-06-17 ENCOUNTER — Encounter: Payer: Self-pay | Admitting: Gastroenterology

## 2020-06-17 ENCOUNTER — Other Ambulatory Visit (INDEPENDENT_AMBULATORY_CARE_PROVIDER_SITE_OTHER): Payer: Medicare Other

## 2020-06-17 ENCOUNTER — Telehealth: Payer: Self-pay | Admitting: *Deleted

## 2020-06-17 ENCOUNTER — Ambulatory Visit: Payer: Medicare Other | Admitting: Gastroenterology

## 2020-06-17 VITALS — BP 138/68 | HR 110 | Ht 66.0 in | Wt 207.0 lb

## 2020-06-17 DIAGNOSIS — D509 Iron deficiency anemia, unspecified: Secondary | ICD-10-CM

## 2020-06-17 DIAGNOSIS — K7460 Unspecified cirrhosis of liver: Secondary | ICD-10-CM

## 2020-06-17 DIAGNOSIS — Z86010 Personal history of colonic polyps: Secondary | ICD-10-CM

## 2020-06-17 DIAGNOSIS — K760 Fatty (change of) liver, not elsewhere classified: Secondary | ICD-10-CM | POA: Diagnosis not present

## 2020-06-17 DIAGNOSIS — R0600 Dyspnea, unspecified: Secondary | ICD-10-CM

## 2020-06-17 LAB — CBC WITH DIFFERENTIAL/PLATELET
Basophils Absolute: 0 10*3/uL (ref 0.0–0.1)
Basophils Relative: 0.7 % (ref 0.0–3.0)
Eosinophils Absolute: 0.1 10*3/uL (ref 0.0–0.7)
Eosinophils Relative: 1 % (ref 0.0–5.0)
HCT: 42 % (ref 36.0–46.0)
Hemoglobin: 14.6 g/dL (ref 12.0–15.0)
Lymphocytes Relative: 17.5 % (ref 12.0–46.0)
Lymphs Abs: 1 10*3/uL (ref 0.7–4.0)
MCHC: 34.7 g/dL (ref 30.0–36.0)
MCV: 85.1 fl (ref 78.0–100.0)
Monocytes Absolute: 0.4 10*3/uL (ref 0.1–1.0)
Monocytes Relative: 6.7 % (ref 3.0–12.0)
Neutro Abs: 4.2 10*3/uL (ref 1.4–7.7)
Neutrophils Relative %: 74.1 % (ref 43.0–77.0)
Platelets: 97 10*3/uL — ABNORMAL LOW (ref 150.0–400.0)
RBC: 4.94 Mil/uL (ref 3.87–5.11)
RDW: 14.9 % (ref 11.5–15.5)
WBC: 5.6 10*3/uL (ref 4.0–10.5)

## 2020-06-17 LAB — PROTIME-INR
INR: 1.3 ratio — ABNORMAL HIGH (ref 0.8–1.0)
Prothrombin Time: 14.7 s — ABNORMAL HIGH (ref 9.6–13.1)

## 2020-06-17 LAB — HEPATIC FUNCTION PANEL
ALT: 31 U/L (ref 0–35)
AST: 37 U/L (ref 0–37)
Albumin: 4 g/dL (ref 3.5–5.2)
Alkaline Phosphatase: 69 U/L (ref 39–117)
Bilirubin, Direct: 0.5 mg/dL — ABNORMAL HIGH (ref 0.0–0.3)
Total Bilirubin: 2.8 mg/dL — ABNORMAL HIGH (ref 0.2–1.2)
Total Protein: 6.3 g/dL (ref 6.0–8.3)

## 2020-06-17 MED ORDER — SUTAB 1479-225-188 MG PO TABS: 1.0000 | ORAL_TABLET | Freq: Once | ORAL | 0 refills | Status: AC

## 2020-06-17 NOTE — Telephone Encounter (Signed)
Per patient request, scheduled sooner follow up.  She is having GI procedures in December and they may want her to be cleared prior.  GI notes from today are in [place].

## 2020-06-17 NOTE — Patient Instructions (Signed)
If you are age 59 or older, your body mass index should be between 23-30. Your Body mass index is 33.41 kg/m. If this is out of the aforementioned range listed, please consider follow up with your Primary Care Provider.  If you are age 30 or younger, your body mass index should be between 19-25. Your Body mass index is 33.41 kg/m. If this is out of the aformentioned range listed, please consider follow up with your Primary Care Provider.    You have been scheduled for an endoscopy and colonoscopy. Please follow the written instructions given to you at your visit today. Please pick up your prep supplies at the pharmacy within the next 1-3 days. If you use inhalers (even only as needed), please bring them with you on the day of your procedure.  Please go to the lab in the basement of our building to have lab work done as you leave today. Hit "B" for basement when you get on the elevator.  When the doors open the lab is on your left.  We will call you with the results. Thank you.  Due to recent changes in healthcare laws, you may see the results of your imaging and laboratory studies on MyChart before your provider has had a chance to review them.  We understand that in some cases there may be results that are confusing or concerning to you. Not all laboratory results come back in the same time frame and the provider may be waiting for multiple results in order to interpret others.  Please give [place] 48 hours in order for your provider to thoroughly review all the results before contacting the office for clarification of your results.   We will request records of your capsule endoscopy from [facility].  Please let us know when you reschedule your Cardiology appointment so that Dr. Havery Moros can review the office note: [phone] (leave a message for Jan, CMA). Thank you  You will be due for an ultrasound of your liver in 10-2020. We will remind you when it is time to schedule.  Thank you for entrusting me  with your care and for choosing [facility], Dr.  Cellar

## 2020-06-17 NOTE — Progress Notes (Signed)
HPI : 59 year old female with a history of obesity, cirrhosis, COPD, OSA, history of PE, referred here to establish care for cirrhosis, referred by Claretta Fraise MD  I have looked back through years of records at [place], [place] hepatology clinic (Dr. Lurlean Leyden), and [facility] notes from recent years and discussion with patient to get history of her cirrhosis.  She states she was diagnosed with cirrhosis several years ago.  She had a positive ANA and mildly positive smooth muscle antibody and it sounds like she was started empirically on steroids at one point in time as well as budesonide.  Her liver enzymes were fluctuating.  She was seen by [place] / Dr. Precious Gilding office in 2018 who reviewed her history and recommended a liver biopsy.  At that point time biopsy showed grade 2 of 3 steatohepatitis along with cirrhosis.  Biopsy was most suggestive of Karlene Lineman cirrhosis and not autoimmune hepatitis.  Her steroids were stopped at that time.  About a year ago she saw [facility] who reviewed her history and recommended some follow-up labs which showed.  She did not complete.  She had FibroScan at that time which suggested cirrhosis.  She states her mother died of cirrhosis thought to be due to fatty liver disease.  The patient emphatically denies any significant alcohol use historically.  She really does not drink any alcohol at all at this time.  She has never had jaundice.  She is on certain if she is ever had ascites.  She has no known history of varices or hepatic encephalopathy. Her last imaging of her abdomen was with a CT scan of the abdomen and pelvis earlier this month which showed changes of cirrhosis and portal hypertension but no acute findings.  She states she has a history of pulmonary embolism while on hormonal therapy in the past.  She was on anticoagulation but states this was complicated by GI bleeding and severe iron deficiency anemia.  She states she had stopped anticoagulation as she does  not tolerate it.  She states due to the damage from prior PE she gets winded easily and is not able to exercise.  She is followed by cardiology and is supposed to see them in January, she states she has worsening dyspnea recently as well as palpitations.  Her daughter works at the cardiology clinic where she is seen.  She states she has had GI bleeding in the past with intermittent dark stools as well as intermittent hematochezia.  She had a work-up with this previously with Dr. Oletta Lamas of [facility] in 2018.  EGD and colonoscopy did not yield any clear cause for her bleeding.  She did not have varices but she did have gastritis, negative for H. pylori, uncertain of portal hypertension was driving this.  She did have 4 adenomas removed on that colonoscopy.  She states she had a capsule study done at [facility] which showed some questionable vascular lesion which was the cause of her bleeding and anemia, I do not see any report of that on file.  She states about 4-5 times a year she will notice some blood in her stool that is significant for her.  She has not had any recently.  She has some occasional upper abdominal discomfort and bloating.  Her last CBC was performed in August which showed hemoglobin of 13.4, MCV of 86, platelets of 76.  She does not tolerate IV iron, states she has had reaction to that in the past.  She  is not taking any oral iron.    Echocardiogram 07/24/18 - EF 65-70%, grade I DD  Fibeorscan 08/26/19 - c/w cirrhosis, at [facility]  Not immune to hep A / B 05/2019 -she states she declined vaccination Alpha one, AMA nehgative, iron studies okay, celiac serology negative   CT abdomen / pelvis with contrast - 05/25/20 - IMPRESSION: Cirrhosis and findings of portal venous hypertension. Increased splenomegaly noted since prior study.  No evidence of hepatic neoplasm.  Tiny nonobstructing left renal calculus.  Aortic Atherosclerosis (ICD10-I70.0).    Colonoscopy - 05/07/17  - Dr. Oletta Lamas -  Two 3 to 4 mm polyps in the cecum, removed with a hot snare. Resected and retrieved. - Two 4 to 7 mm polyps in the descending colon, removed with a hot snare. Resected and retrieved. - Non-bleeding internal hemorrhoids.  Total duration of colonoscopy - 4 minutes  EGD 05/07/17 - Dr. Oletta Lamas - no varices, gastriits, normal duodenum   1. Stomach, biopsy, antrum - REACTIVE GASTROPATHY. - THERE IS NO EVIDENCE OF HELICOBACTER PYLORI, DYSPLASIA OR MALIGNANCY. - SEE COMMENT. 2. Colon, polyp(s), cecal x 2 - TUBULAR ADENOMA(S). - HIGH GRADE DYSPLASIA IS NOT IDENTIFIED. 3. Colon, polyp(s), descending x 2 - TUBULAR ADENOMA(S). - HIGH GRADE DYSPLASIA IS NOT IDENTIFIED.  Liver biopsy 03/15/18 -  Liver, needle/core biopsy, transjug - MILD TO MODERATELY ACTIVE STEATOHEPATITIS (GRADE 1-2 OF 3; IF DETERMINED TO BE NAFLD, THEN THE NAS = 3 OF 8). - CIRRHOSIS (STAGE 4 OF 4) Diagnosis Note The biopsy is adequate for review and consists of several cores of liver with a distorted architecture. Trichrome stain highlights the presence of septal fibrosis as well as a pericellular pattern of fibrosis. Hepatic nodules show mild macrovesicular steatosis (approximately 20%) with mild to moderate ballooning degeneration and few poorly formed Mallory hyalins. Hepatic nodules do not show any significant inflammation. Definitive megamitochondria are not seen. The portal tracts and fibrous septa mild to moderate, mostly lymphocytic inflammation without significant interface hepatitis. Mild ductular reaction is present with associated nonspecific neutrophilic response. Iron stain shows moderate granular iron accumulation within Kupffer cells, consistent with secondary iron overload. PASD stain shows no globular inclusions in hepatocytes. The findings are consistent with mold to moderately acive steatohepatitis, alcoholic and/or non-alcoholic, with cirrhosis.   Past Medical History:  Diagnosis Date  . Anemia   .  Anticoagulation monitoring by pharmacist   . Arthritis   . Asthma   . Blood transfusion without reported diagnosis   . Cataract   . Cirrhosis ([place])   . COPD (chronic obstructive pulmonary disease) ([place])   . Diabetes mellitus without complication ([place])   . Dyspnea    exertion  . Elevated hemoglobin A1c 03/01/2018  . History of pulmonary embolism 02/04/2013  . Hyperlipidemia   . NASH (nonalcoholic steatohepatitis)   . PE (pulmonary embolism)   . Sleep apnea      Past Surgical History:  Procedure Laterality Date  . ABDOMINAL HYSTERECTOMY    . CATARACT EXTRACTION, BILATERAL Bilateral   . CHOLECYSTECTOMY    . COLONOSCOPY WITH PROPOFOL N/A 05/07/2017   Procedure: COLONOSCOPY WITH PROPOFOL;  Surgeon: Laurence Spates, MD;  Location: [facility];  Service: Endoscopy;  Laterality: N/A;  . ESOPHAGOGASTRODUODENOSCOPY (EGD) WITH PROPOFOL N/A 05/07/2017   Procedure: ESOPHAGOGASTRODUODENOSCOPY (EGD) WITH PROPOFOL;  Surgeon: Laurence Spates, MD;  Location: [facility];  Service: Endoscopy;  Laterality: N/A;  . EYE SURGERY     Cataracts  . INCONTINENCE SURGERY  2009  . IR TRANSCATHETER BX  03/15/2018  .  IR VENOGRAM HEPATIC W HEMODYNAMIC EVALUATION  03/15/2018  . LEFT AND RIGHT HEART CATHETERIZATION WITH CORONARY ANGIOGRAM N/A 05/13/2014   Procedure: LEFT AND RIGHT HEART CATHETERIZATION WITH CORONARY ANGIOGRAM;  Surgeon: [name], MD;  Location: [facility] CATH LAB;  Service: Cardiovascular;  Laterality: N/A;  . SPINE SURGERY     L5 S1 fusion  . TUBAL LIGATION    . WRIST SURGERY Right    Had surgery twice to shave bone for blood circulation improvement.   Family History  Problem Relation Age of Onset  . Cirrhosis Mother        NASH  . Irritable bowel syndrome Mother   . Hyperlipidemia Father   . Heart failure Father   . Bipolar disorder Father   . Heart disease Father   . Obesity Daughter   . Hypertension Brother   . Colon polyps Sister   . Irritable bowel syndrome Sister   . Heart  attack Neg Hx   . Miscarriages / Stillbirths Neg Hx    Social History   Tobacco Use  . Smoking status: Former Smoker    Packs/day: 2.00    Years: 50.00    Pack years: 100.00    Types: Cigarettes    Start date: 08/22/1977    Quit date: 04/21/2012    Years since quitting: 8.1  . Smokeless tobacco: Never Used  Vaping Use  . Vaping Use: Never used  Substance Use Topics  . Alcohol use: No  . Drug use: No   Current Outpatient Medications  Medication Sig Dispense Refill  . Accu-Chek Softclix Lancets lancets Test BS BID and prn Dx E11.9 200 each 3  . acetaminophen (TYLENOL) 500 MG tablet Take 500 mg by mouth every 6 (six) hours as needed.    Marland Kitchen albuterol (VENTOLIN HFA) 108 (90 Base) MCG/ACT inhaler Inhale 2 puffs into the lungs every 6 (six) hours as needed for wheezing or shortness of breath. 18 g 0  . Blood Glucose Monitoring Suppl (ONETOUCH VERIO REFLECT) w/Device KIT Test BS twice daily Dx e11.9 1 kit 0  . budesonide-formoterol (SYMBICORT) 80-4.5 MCG/ACT inhaler Inhale 2 puffs into the lungs in the morning and at bedtime. 1 Inhaler 12  . fluticasone (FLONASE) 50 MCG/ACT nasal spray Place 2 sprays into both nostrils daily. 16 g 11  . glucose blood (ACCU-CHEK GUIDE) test strip Test BS BID Dx E11.9 200 each 3  . ipratropium-albuterol (DUONEB) 0.5-2.5 (3) MG/3ML SOLN Take 3 mLs by nebulization every 6 (six) hours as needed. 360 mL 1  . metFORMIN (GLUCOPHAGE-XR) 750 MG 24 hr tablet Take 2 tablets (1,500 mg total) by mouth daily with breakfast. 180 tablet 0  . Sodium Sulfate-Mag Sulfate-KCl (SUTAB) [phone] MG TABS Take 1 kit by mouth once for 1 dose. MANUFACTURER CODES!! BIN: [id number] PCN: CN GROUP: [id number] MEMBER ID: [id number];BJS AS CASH;NO PRIOR AUTHORIZATION 24 tablet 0  . traZODone (DESYREL) 150 MG tablet Use from 1/3 to 1 tablet nightly as needed for sleep. 90 tablet 1  . triamcinolone cream (KENALOG) 0.1 % Apply 1 application topically 3 (three) times daily. Avoid face and genitalia  45 g 0   No current facility-administered medications for this visit.   Allergies  Allergen Reactions  . Aciphex [Rabeprazole Sodium] Rash  . Fluconazole Rash and Hives  . Lansoprazole Hives  . Sucralfate Nausea Only  . Vancomycin Diarrhea  . Claritin [Loratadine] Other (See Comments)    Tired   . Crestor [Rosuvastatin] Other (See Comments)    Elevated blood glucose  and abdominal pain  . Doxycycline Rash  . Ibuprofen Other (See Comments)    Upset stomach  . Lipitor [Atorvastatin] Other (See Comments)    Myalgias, leg pain.  Problems with pancreas, sugars went up also.  Santiago Bur [Nitrofurantoin Macrocrystal] Nausea And Vomiting  . Metoprolol Nausea And Vomiting  . Metronidazole Other (See Comments) and Nausea Only    Dizziness, nausea, dry mouth and some shortness of breath  . Nsaids Other (See Comments)    Upset stomach  . Omeprazole     bloating  . Rabeprazole Rash  . Septra [Sulfamethoxazole-Trimethoprim] Nausea And Vomiting     Review of Systems: All systems reviewed and negative except where noted in HPI.    CT Abdomen Pelvis W Contrast  Result Date: 05/25/2020 CLINICAL DATA:  Right upper quadrant and epigastric pain for 2-3 years. Diarrhea. Nonalcoholic steatohepatitis. EXAM: CT ABDOMEN AND PELVIS WITH CONTRAST TECHNIQUE: Multidetector CT imaging of the abdomen and pelvis was performed using the standard protocol following bolus administration of intravenous contrast. CONTRAST:  13m OMNIPAQUE IOHEXOL 300 MG/ML  SOLN COMPARISON:  04/19/2017 FINDINGS: Lower Chest: No acute findings. Hepatobiliary: Cirrhosis is again demonstrated. No hepatic masses are identified. Portal veins are patent. Prior cholecystectomy. No evidence of biliary obstruction. Pancreas:  No mass or inflammatory changes. Spleen: Moderate splenomegaly has increased since previous study, with length currently measuring 17 cm compared to 14 cm previously. No splenic masses identified. Adrenals/Urinary  Tract: No masses identified. Sub-cm left renal cyst noted. Tiny 2 mm left renal calculus again seen. No evidence of ureteral calculi or hydronephrosis. Stomach/Bowel: No evidence of obstruction, inflammatory process or abnormal fluid collections. Normal appendix visualized. Vascular/Lymphatic: No pathologically enlarged lymph nodes. No abdominal aortic aneurysm. Aortic atherosclerosis noted. Recanalization of paraumbilical veins and mild upper abdominal portosystemic collaterals are again seen, consistent with portal venous hypertension. Reproductive: Prior hysterectomy noted. Adnexal regions are unremarkable in appearance. Other:  None.  No evidence of ascites. Musculoskeletal:  No suspicious bone lesions identified. IMPRESSION: Cirrhosis and findings of portal venous hypertension. Increased splenomegaly noted since prior study. No evidence of hepatic neoplasm. Tiny nonobstructing left renal calculus. Aortic Atherosclerosis (ICD10-I70.0). Electronically Signed   By: JMarlaine HindM.D.   On: 05/25/2020 08:47    Lab Results  Component Value Date   WBC 4.2 04/07/2020   HGB 13.4 04/07/2020   HCT 39.9 04/07/2020   MCV 86.2 04/07/2020   PLT 76 (L) 04/07/2020    CBC Latest Ref Rng & Units 04/07/2020 02/12/2020 11/11/2019  WBC 4.0 - 10.5 K/uL 4.2 5.6 5.1  Hemoglobin 12.0 - 15.0 g/dL 13.4 14.7 15.4  Hematocrit 36 - 46 % 39.9 41.3 44.6  Platelets 150 - 400 K/uL 76(L) 92(LL) 88(LL)    Lab Results  Component Value Date   CREATININE 0.50 05/24/2020   BUN 9 04/07/2020   NA 142 04/07/2020   K 3.8 04/07/2020   CL 108 04/07/2020   CO2 26 04/07/2020    Lab Results  Component Value Date   ALT 25 04/07/2020   AST 33 04/07/2020   ALKPHOS 74 04/07/2020   BILITOT 2.4 (H) 04/07/2020    Lab Results  Component Value Date   INR 1.18 03/15/2018   INR 2.2 (H) 04/27/2017   INR 2.4 (H) 03/12/2017     Physical Exam: BP 138/68   Pulse (!) 110   Ht _0  (1.676 m)   Wt 207 lb (93.9 kg)   BMI 33.41 kg/m   Constitutional: Pleasant,well-developed, female in no acute  distress. HEENT: Normocephalic and atraumatic. Conjunctivae are normal. No scleral icterus. Neck supple.  Cardiovascular: Normal rate, regular rhythm.  Pulmonary/chest: Effort normal and breath sounds normal.  Abdominal: Soft, nondistended, nontender.  There are no masses palpable.  Extremities: no edema Lymphadenopathy: No cervical adenopathy noted. Neurological: Alert and oriented to person place and time. Skin: Skin is warm and dry. No rashes noted. Psychiatric: Normal mood and affect. Behavior is normal.   ASSESSMENT AND PLAN: 59 year old female here for new patient visit regarding the following:  Cirrhosis / NASH - history as outlined above, sounds like she was initially thought to have autoimmune hepatitis and treated with steroids for period of time, ultimately had a liver biopsy which showed Nash cirrhosis, no evidence of autoimmune hepatitis.  She has been off steroids for a few years now and has remained compensated from what she is telling me today.  I discussed cirrhosis with her and fatty liver disease, risks for decompensation and HCC moving forward.  She is due for INR and AFP, will recheck CBC in regards to her platelet count for upcoming endoscopic procedures and recheck her LFTs.  I will also add on an IgG and smooth muscle antibody to make sure okay but again autoimmune hepatitis appears unlikely at this time.  She is not immune to hepatitis A or B but declines these vaccinations given her history of adverse reactions to vaccines in the past.  She does not drink any alcohol.  I discussed fatty liver with her, important to work on weight loss and exercise which I think would benefit her.  She is limited by her dyspnea as outlined below, this is been an ongoing challenge for her.  I do recommend routine coffee intake which can help prevent worsening fibrotic change in the setting of fatty liver.  She is due for [facility]  screening in March of next year.  She is otherwise due for screening for esophageal varices.  I discussed risk benefits of EGD and anesthesia with her and she is wishing to proceed however, before we do so, I need her to see her cardiologist for follow-up given her complaints of worsening dyspnea with exertion and intermittent palpitations.  This is been attributed to her history of PE in the past however given this appears more active for her recently, we will tentatively schedule her procedures with [place] in December in hopes of her seeing cardiology in the next month or so.  She was comfortable with this.  We will continue to see her every 6 months otherwise for routine management of her cirrhosis.  Iron deficiency anemia / history of colon polyps - patient endorses intolerance of anticoagulation in the past due to GI bleeding and history of iron deficiency anemia for which she does not tolerate IV iron.  She describes intermittent dark stools a few times a year as well as rectal bleeding occasionally.  Prior EGD and colonoscopy in 2018 as above.  She states she had a capsule endoscopy showing the source in her small bowel, however I have no records of that.  I will reach out to [facility] to try to get that report.  Otherwise given her history of colon polyps she is due for colonoscopy for surveillance purposes, it also appears that total duration of her last colonoscopy was only 4 minutes long.  She is agreeable to EGD and colonoscopy following her cardiology evaluation as below.  In the interim I will recheck her CBC to ensure no change in her hemoglobin  although more recently her hemoglobin has been stable/normal.  Dyspnea / palpitations - I asked her to contact her cardiologist, her daughter works for that office, will try to see her in the next few weeks.  She states symptoms are chronic however with interval worsening.  Cassandra Cellar, MD [facility]  CC: Claretta Fraise, MD

## 2020-06-18 LAB — AFP TUMOR MARKER: AFP-Tumor Marker: 3.3 ng/mL

## 2020-06-18 LAB — IGG: IgG (Immunoglobin G), Serum: 1106 mg/dL (ref 600–1640)

## 2020-06-21 ENCOUNTER — Other Ambulatory Visit: Payer: Self-pay | Admitting: Family Medicine

## 2020-06-21 DIAGNOSIS — Z1231 Encounter for screening mammogram for malignant neoplasm of breast: Secondary | ICD-10-CM

## 2020-06-24 ENCOUNTER — Telehealth: Payer: Self-pay | Admitting: Gastroenterology

## 2020-06-24 NOTE — Telephone Encounter (Signed)
Kimberly Obrien at [facility] indicates that pharmacy filled suprep instead of sutab in error. They will fill sutab and will run coupon code. Patient advised that rx should be $50. She verbalizes understanding of this.

## 2020-06-24 NOTE — Telephone Encounter (Signed)
Patient called states the prep medication is too expensive for her please send an alternate

## 2020-06-26 DIAGNOSIS — J449 Chronic obstructive pulmonary disease, unspecified: Secondary | ICD-10-CM | POA: Diagnosis not present

## 2020-06-29 ENCOUNTER — Encounter: Payer: Self-pay | Admitting: *Deleted

## 2020-06-30 ENCOUNTER — Telehealth: Payer: Self-pay | Admitting: Gastroenterology

## 2020-06-30 NOTE — Telephone Encounter (Signed)
Records of prior capsule arrived:  Dr. Oletta Lamas - 08/22/17 - one nonbleeding proximal small bowel AVM, otherwise normal  Will await pending EGD and colonoscopy per clinic visit.

## 2020-07-12 ENCOUNTER — Ambulatory Visit: Payer: Medicare Other | Admitting: Cardiovascular Disease

## 2020-07-12 ENCOUNTER — Other Ambulatory Visit: Payer: Self-pay

## 2020-07-12 ENCOUNTER — Encounter: Payer: Self-pay | Admitting: Cardiovascular Disease

## 2020-07-12 VITALS — BP 118/70 | HR 72 | Ht 66.0 in | Wt 207.0 lb

## 2020-07-12 DIAGNOSIS — I5189 Other ill-defined heart diseases: Secondary | ICD-10-CM

## 2020-07-12 MED ORDER — FUROSEMIDE 20 MG PO TABS: 20.0000 mg | ORAL_TABLET | ORAL | 3 refills | Status: DC | PRN

## 2020-07-12 NOTE — Progress Notes (Signed)
Chief Complaint  Patient presents with  . Follow-up    Diastolic dysfunction   History of Present Illness: 59 yo female with history of autoimmune hepatitis, prior tobacco abuse, COPD/asthma, arthritis and pulmonary embolism who is here today for cardiac follow up. She had been followed in our [facility] office by Dr. Bronson Ing. I met her in February 2017. She has been evaluated over the years for tachycardia/palpitations/fatigue/dyspnea. Echo April 2015 showed normal LV systolic and diastolic function with no valve issues. Right and left heart cath 2015 with no evidence of CAD and normal PA pressures. Cardiac monitor 2016 with rare PVCS, PACs. She has no history of atrial fibrillation. CTA chest 2015 with no PE. She did have a PE in 1999 with recurrence in 2003. She remained on coumadin because of history of recurrent PE but this was stopped in September 2018 due to GI bleeding.  Colonoscopy September 2018 with 4 polyps removed and non bleeding internal hemorrhoids as well as gastritis. She has been seen in the Hematology. She has a history of prior tobacco abuse and COPD. She has seen Pulmonary but she is only felt to have mild COPD per pt. She was seen in our office November 2019 and had c/o dyspnea and palpitations. Echo December 2019 with RDEY=81-44%, grade 1 diastolic dysfunction. No valve disease. Normal PA pressures. No pericardial effusion. Cardiac monitor December 2019 was normal.   She is here today for follow up. The patient denies any chest pain, lower extremity edema, orthopnea, PND, dizziness, near syncope or syncope. She has rare skipped heart beats. No long episodes of tachycardia. She has ongoing dyspnea with minimal exertion. She has not seen Dr. Elsworth Soho in the pulmonary clinic since October 2020 and has not been taking Symbicort due to cost.   Primary Care Physician: Claretta Fraise, MD [facility])  Past Medical History:  Diagnosis Date  . Anemia   . Anticoagulation monitoring  by pharmacist   . Arthritis   . Asthma   . Blood transfusion without reported diagnosis   . Cataract   . Cirrhosis ([place])   . COPD (chronic obstructive pulmonary disease) ([place])   . Diabetes mellitus without complication ([place])   . Dyspnea    exertion  . Elevated hemoglobin A1c 03/01/2018  . History of pulmonary embolism 02/04/2013  . Hyperlipidemia   . NASH (nonalcoholic steatohepatitis)   . PE (pulmonary embolism)   . Sleep apnea     Past Surgical History:  Procedure Laterality Date  . ABDOMINAL HYSTERECTOMY    . CATARACT EXTRACTION, BILATERAL Bilateral   . CHOLECYSTECTOMY    . COLONOSCOPY WITH PROPOFOL N/A 05/07/2017   Procedure: COLONOSCOPY WITH PROPOFOL;  Surgeon: Laurence Spates, MD;  Location: [facility];  Service: Endoscopy;  Laterality: N/A;  . ESOPHAGOGASTRODUODENOSCOPY (EGD) WITH PROPOFOL N/A 05/07/2017   Procedure: ESOPHAGOGASTRODUODENOSCOPY (EGD) WITH PROPOFOL;  Surgeon: Laurence Spates, MD;  Location: [facility];  Service: Endoscopy;  Laterality: N/A;  . EYE SURGERY     Cataracts  . INCONTINENCE SURGERY  2009  . IR TRANSCATHETER BX  03/15/2018  . IR VENOGRAM HEPATIC W HEMODYNAMIC EVALUATION  03/15/2018  . LEFT AND RIGHT HEART CATHETERIZATION WITH CORONARY ANGIOGRAM N/A 05/13/2014   Procedure: LEFT AND RIGHT HEART CATHETERIZATION WITH CORONARY ANGIOGRAM;  Surgeon: [name], MD;  Location: [facility] CATH LAB;  Service: Cardiovascular;  Laterality: N/A;  . SPINE SURGERY     L5 S1 fusion  . TUBAL LIGATION    . WRIST SURGERY Right  Had surgery twice to shave bone for blood circulation improvement.    Current Outpatient Medications  Medication Sig Dispense Refill  . Accu-Chek Softclix Lancets lancets Test BS BID and prn Dx E11.9 200 each 3  . acetaminophen (TYLENOL) 500 MG tablet Take 500 mg by mouth every 6 (six) hours as needed.    Marland Kitchen albuterol (VENTOLIN HFA) 108 (90 Base) MCG/ACT inhaler Inhale 2 puffs into the lungs every 6 (six) hours as needed for wheezing  or shortness of breath. 18 g 0  . Blood Glucose Monitoring Suppl (ONETOUCH VERIO REFLECT) w/Device KIT Test BS twice daily Dx e11.9 1 kit 0  . budesonide-formoterol (SYMBICORT) 80-4.5 MCG/ACT inhaler Inhale 2 puffs into the lungs in the morning and at bedtime. 1 Inhaler 12  . fluticasone (FLONASE) 50 MCG/ACT nasal spray Place 2 sprays into both nostrils daily. 16 g 11  . glucose blood (ACCU-CHEK GUIDE) test strip Test BS BID Dx E11.9 200 each 3  . ipratropium-albuterol (DUONEB) 0.5-2.5 (3) MG/3ML SOLN Take 3 mLs by nebulization every 6 (six) hours as needed. 360 mL 1  . metFORMIN (GLUCOPHAGE-XR) 750 MG 24 hr tablet Take 2 tablets (1,500 mg total) by mouth daily with breakfast. 180 tablet 0  . traZODone (DESYREL) 150 MG tablet Use from 1/3 to 1 tablet nightly as needed for sleep. 90 tablet 1  . triamcinolone cream (KENALOG) 0.1 % Apply 1 application topically 3 (three) times daily. Avoid face and genitalia 45 g 0  . furosemide (LASIX) 20 MG tablet Take 1 tablet (20 mg total) by mouth as needed. 30 tablet 3   No current facility-administered medications for this visit.    Allergies  Allergen Reactions  . Aciphex [Rabeprazole Sodium] Rash  . Fluconazole Rash and Hives  . Lansoprazole Hives  . Sucralfate Nausea Only  . Vancomycin Diarrhea  . Claritin [Loratadine] Other (See Comments)    Tired   . Crestor [Rosuvastatin] Other (See Comments)    Elevated blood glucose and abdominal pain  . Doxycycline Rash  . Ibuprofen Other (See Comments)    Upset stomach  . Lipitor [Atorvastatin] Other (See Comments)    Myalgias, leg pain.  Problems with pancreas, sugars went up also.  Santiago Bur [Nitrofurantoin Macrocrystal] Nausea And Vomiting  . Metoprolol Nausea And Vomiting  . Metronidazole Other (See Comments) and Nausea Only    Dizziness, nausea, dry mouth and some shortness of breath  . Nsaids Other (See Comments)    Upset stomach  . Omeprazole     bloating  . Rabeprazole Rash  . Septra  [Sulfamethoxazole-Trimethoprim] Nausea And Vomiting    Social History   Socioeconomic History  . Marital status: Married    Spouse name: Shanon Brow  . Number of children: 4  . Years of education: GED  . Highest education level: GED or equivalent  Occupational History  . Occupation: Unemployed  Tobacco Use  . Smoking status: Former Smoker    Packs/day: 2.00    Years: 50.00    Pack years: 100.00    Types: Cigarettes    Start date: 08/22/1977    Quit date: 04/21/2012    Years since quitting: 8.2  . Smokeless tobacco: Never Used  Vaping Use  . Vaping Use: Never used  Substance and Sexual Activity  . Alcohol use: No  . Drug use: No  . Sexual activity: Not Currently    Birth control/protection: Surgical  Other Topics Concern  . Not on file  Social History Narrative  . Not on file  Social Determinants of Health   Financial Resource Strain: Low Risk   . Difficulty of Paying Living Expenses: Not very hard  Food Insecurity: No Food Insecurity  . Worried About Charity fundraiser in the Last Year: Never true  . Ran Out of Food in the Last Year: Never true  Transportation Needs: No Transportation Needs  . Lack of Transportation (Medical): No  . Lack of Transportation (Non-Medical): No  Physical Activity: Inactive  . Days of Exercise per Week: 0 days  . Minutes of Exercise per Session: 0 min  Stress: Stress Concern Present  . Feeling of Stress : To some extent  Social Connections: Moderately Integrated  . Frequency of Communication with Friends and Family: More than three times a week  . Frequency of Social Gatherings with Friends and Family: More than three times a week  . Attends Religious Services: More than 4 times per year  . Active Member of Clubs or Organizations: No  . Attends Archivist Meetings: Never  . Marital Status: Married  Human resources officer Violence: Not At Risk  . Fear of Current or Ex-Partner: No  . Emotionally Abused: No  . Physically Abused: No  .  Sexually Abused: No    Family History  Problem Relation Age of Onset  . Cirrhosis Mother        NASH  . Irritable bowel syndrome Mother   . Hyperlipidemia Father   . Heart failure Father   . Bipolar disorder Father   . Heart disease Father   . Obesity Daughter   . Hypertension Brother   . Colon polyps Sister   . Irritable bowel syndrome Sister   . Heart attack Neg Hx   . Miscarriages / Stillbirths Neg Hx     Review of Systems:  As stated in the HPI and otherwise negative.   BP 118/70   Pulse 72   Ht 5' 6"  (1.676 m)   Wt 207 lb (93.9 kg)   SpO2 96%   BMI 33.41 kg/m   Physical Examination:  General: Well developed, well nourished, NAD  HEENT: OP clear, mucus membranes moist  SKIN: warm, dry. No rashes. Neuro: No focal deficits  Musculoskeletal: Muscle strength 5/5 all ext  Psychiatric: Mood and affect normal  Neck: No JVD, no carotid bruits, no thyromegaly, no lymphadenopathy.  Lungs:Clear bilaterally, no wheezes, rhonci, crackles Cardiovascular: Regular rate and rhythm. No murmurs, gallops or rubs. Abdomen:Soft. Bowel sounds present. Non-tender.  Extremities: No lower extremity edema. Pulses are 2 + in the bilateral DP/PT.  EKG:  EKG is  ordered today. The ekg ordered today demonstrates Sinus  Echo 07/24/18: Left ventricle: The cavity size was normal. There was mild focal   basal hypertrophy of the septum. Systolic function was vigorous.   The estimated ejection fraction was in the range of 65% to 70%.   Wall motion was normal; there were no regional wall motion   abnormalities. Doppler parameters are consistent with abnormal   left ventricular relaxation (grade 1 diastolic dysfunction).   There was no evidence of elevated ventricular filling pressure by   Doppler parameters. - Mitral valve: There was no regurgitation. - Right ventricle: The cavity size was normal. Wall thickness was   normal. Systolic function was normal. - Right atrium: The atrium was normal  in size. - Tricuspid valve: There was no regurgitation. - Pulmonic valve: There was no regurgitation. - Pulmonary arteries: Systolic pressure was within the normal   range. - Inferior vena cava: The  vessel was normal in size. - Pericardium, extracardiac: There was no pericardial effusion.  Recent Labs: 04/07/2020: BUN 9; Potassium 3.8; Sodium 142 05/24/2020: Creatinine, Ser 0.50 06/17/2020: ALT 31; Hemoglobin 14.6; Platelets 97.0   Lipid Panel    Component Value Date/Time   CHOL 156 02/12/2020 1313   CHOL 208 (H) 02/04/2013 1317   TRIG 152 (H) 02/12/2020 1313   TRIG 110 12/21/2014 0000   TRIG 136 02/04/2013 1317   HDL 55 02/12/2020 1313   HDL 60 12/21/2014 0000   HDL 40 02/04/2013 1317   CHOLHDL 2.8 02/12/2020 1313   LDLCALC 75 02/12/2020 1313   LDLCALC 65 08/04/2013 1332   LDLCALC 141 (H) 02/04/2013 1317     Wt Readings from Last 3 Encounters:  07/12/20 207 lb (93.9 kg)  06/17/20 207 lb (93.9 kg)  04/29/20 209 lb (94.8 kg)     Other studies Reviewed: Additional studies/ records that were reviewed today include: . Review of the above records demonstrates:    Assessment and Plan:   1. Diastolic dysfunction/Dyspnea: Her dyspnea has been felt to be multi-factorial given COPD, anemia and deconditioned state. She has normal LV systolic function and no evidence of valve disease by echo December 2019. Normal PA pressures. She does have grade 1 diastolic dysfunction. No evidence of CAD by cardiac cath in 2015. She is not volume overloaded. Sinus today. I have asked her to get back into the pulmonary office to see Dr. Elsworth Soho. I will give her Lasix 20 mg to use prn for swelling or bloating.   2. Palpitations:  She has rare palpitations.   OK to proceed with planned EGD and colonoscopy.   Current medicines are reviewed at length with the patient today.  The patient does not have concerns regarding medicines.  The following changes have been made:  no change  Labs/ tests ordered  today include: None  No orders of the defined types were placed in this encounter.  Disposition:   FU with me in 12 months  Signed, Lauree Chandler, MD 07/12/2020 11:17 AM    [address], [place], [place]  [address] Phone: [phone]; Fax: [phone]

## 2020-07-12 NOTE — Patient Instructions (Signed)
Medication Instructions:  No changes *If you need a refill on your cardiac medications before your next appointment, please call your pharmacy*   Lab Work: none If you have labs (blood work) drawn today and your tests are completely normal, you will receive your results only by: Marland Kitchen MyChart Message (if you have MyChart) OR . A paper copy in the mail If you have any lab test that is abnormal or we need to change your treatment, we will call you to review the results.   Testing/Procedures: none   Follow-Up: At [facility], you and your health needs are our priority.  As part of our continuing mission to provide you with exceptional heart care, we have created designated Provider Care Teams.  These Care Teams include your primary Cardiologist (physician) and Advanced Practice Providers (APPs -  Physician Assistants and Nurse Practitioners) who all work together to provide you with the care you need, when you need it.   Your next appointment:   12 month(s)  The format for your next appointment:   In Person  Provider:   You may see Lauree Chandler, MD or one of the following Advanced Practice Providers on your designated Care Team:    Melina Copa, PA-C  Ermalinda Barrios, PA-C    Other Instructions

## 2020-07-22 ENCOUNTER — Other Ambulatory Visit: Payer: Self-pay | Admitting: Family Medicine

## 2020-07-26 DIAGNOSIS — J449 Chronic obstructive pulmonary disease, unspecified: Secondary | ICD-10-CM | POA: Diagnosis not present

## 2020-07-27 ENCOUNTER — Other Ambulatory Visit: Payer: Self-pay | Admitting: Gastroenterology

## 2020-07-27 DIAGNOSIS — Z1159 Encounter for screening for other viral diseases: Secondary | ICD-10-CM | POA: Diagnosis not present

## 2020-07-27 LAB — SARS CORONAVIRUS 2 (TAT 6-24 HRS): SARS Coronavirus 2: NEGATIVE

## 2020-07-28 ENCOUNTER — Other Ambulatory Visit: Payer: Self-pay

## 2020-07-28 ENCOUNTER — Ambulatory Visit
Admission: RE | Admit: 2020-07-28 | Discharge: 2020-07-28 | Disposition: A | Discharge status: Home / Self Care | Payer: Medicare Other | Source: Ambulatory Visit | Attending: Family Medicine | Admitting: Family Medicine

## 2020-07-28 DIAGNOSIS — Z1231 Encounter for screening mammogram for malignant neoplasm of breast: Secondary | ICD-10-CM | POA: Diagnosis not present

## 2020-07-29 ENCOUNTER — Other Ambulatory Visit: Payer: Self-pay

## 2020-07-29 ENCOUNTER — Ambulatory Visit (AMBULATORY_SURGERY_CENTER): Payer: Medicare Other | Admitting: Gastroenterology

## 2020-07-29 ENCOUNTER — Encounter: Payer: Self-pay | Admitting: Gastroenterology

## 2020-07-29 VITALS — BP 121/46 | HR 69 | Temp 98.9°F | Resp 12 | Ht 66.0 in | Wt 207.0 lb

## 2020-07-29 DIAGNOSIS — K317 Polyp of stomach and duodenum: Secondary | ICD-10-CM | POA: Diagnosis not present

## 2020-07-29 DIAGNOSIS — K2950 Unspecified chronic gastritis without bleeding: Secondary | ICD-10-CM | POA: Diagnosis not present

## 2020-07-29 DIAGNOSIS — K7460 Unspecified cirrhosis of liver: Secondary | ICD-10-CM

## 2020-07-29 DIAGNOSIS — D122 Benign neoplasm of ascending colon: Secondary | ICD-10-CM

## 2020-07-29 DIAGNOSIS — K319 Disease of stomach and duodenum, unspecified: Secondary | ICD-10-CM

## 2020-07-29 DIAGNOSIS — D123 Benign neoplasm of transverse colon: Secondary | ICD-10-CM

## 2020-07-29 DIAGNOSIS — Z860100 Personal history of colon polyps, unspecified: Secondary | ICD-10-CM

## 2020-07-29 DIAGNOSIS — K766 Portal hypertension: Secondary | ICD-10-CM | POA: Diagnosis not present

## 2020-07-29 DIAGNOSIS — K2970 Gastritis, unspecified, without bleeding: Secondary | ICD-10-CM

## 2020-07-29 DIAGNOSIS — D124 Benign neoplasm of descending colon: Secondary | ICD-10-CM | POA: Diagnosis not present

## 2020-07-29 DIAGNOSIS — D128 Benign neoplasm of rectum: Secondary | ICD-10-CM

## 2020-07-29 DIAGNOSIS — K635 Polyp of colon: Secondary | ICD-10-CM | POA: Diagnosis not present

## 2020-07-29 DIAGNOSIS — K449 Diaphragmatic hernia without obstruction or gangrene: Secondary | ICD-10-CM | POA: Diagnosis not present

## 2020-07-29 DIAGNOSIS — D125 Benign neoplasm of sigmoid colon: Secondary | ICD-10-CM | POA: Diagnosis not present

## 2020-07-29 DIAGNOSIS — D649 Anemia, unspecified: Secondary | ICD-10-CM | POA: Diagnosis not present

## 2020-07-29 MED ORDER — SODIUM CHLORIDE 0.9 % IV SOLN: 500.0000 mL | Freq: Once | INTRAVENOUS | Status: DC

## 2020-07-29 MED ORDER — OMEPRAZOLE 20 MG PO CPDR: 20.0000 mg | DELAYED_RELEASE_CAPSULE | Freq: Every day | ORAL | 0 refills | Status: DC

## 2020-07-29 NOTE — Progress Notes (Signed)
Called to room to assist during endoscopic procedure.  Patient ID and intended procedure confirmed with present staff. Received instructions for my participation in the procedure from the performing physician.  

## 2020-07-29 NOTE — Progress Notes (Signed)
A/ox3, pleased with MAC, report to RN 

## 2020-07-29 NOTE — Patient Instructions (Signed)
Handout given: polyp, gastritis Resume previous diet Continue current medications Await pathology results AVOID NSAIDs: aspirin, aleve, ibuprofen,  May benefit from omeprazole 19m ever day for gastritis  YOU HAD AN ENDOSCOPIC PROCEDURE TODAY AT [facility]   Refer to the procedure report that was given to you for any specific questions about what was found during the examination.  If the procedure report does not answer your questions, please call your gastroenterologist to clarify.  If you requested that your care partner not be given the details of your procedure findings, then the procedure report has been included in a sealed envelope for you to review at your convenience later.  YOU SHOULD EXPECT: Some feelings of bloating in the abdomen. Passage of more gas than usual.  Walking can help get rid of the air that was put into your GI tract during the procedure and reduce the bloating. If you had a lower endoscopy (such as a colonoscopy or flexible sigmoidoscopy) you may notice spotting of blood in your stool or on the toilet paper. If you underwent a bowel prep for your procedure, you may not have a normal bowel movement for a few days.  Please Note:  You might notice some irritation and congestion in your nose or some drainage.  This is from the oxygen used during your procedure.  There is no need for concern and it should clear up in a day or so.  SYMPTOMS TO REPORT IMMEDIATELY:   Following lower endoscopy (colonoscopy or flexible sigmoidoscopy):  Excessive amounts of blood in the stool  Significant tenderness or worsening of abdominal pains  Swelling of the abdomen that is new, acute  Fever of 100F or higher   Following upper endoscopy (EGD)  Vomiting of blood or coffee ground material  New chest pain or pain under the shoulder blades  Painful or persistently difficult swallowing  New shortness of breath  Fever of 100F or higher  Black, tarry-looking  stools  For urgent or emergent issues, a gastroenterologist can be reached at any hour by calling ([phone] Do not use MyChart messaging for urgent concerns.   DIET:  We do recommend a small meal at first, but then you may proceed to your regular diet.  Drink plenty of fluids but you should avoid alcoholic beverages for 24 hours.  ACTIVITY:  You should plan to take it easy for the rest of today and you should NOT DRIVE or use heavy machinery until tomorrow (because of the sedation medicines used during the test).    FOLLOW UP: Our staff will call the number listed on your records 48-72 hours following your procedure to check on you and address any questions or concerns that you may have regarding the information given to you following your procedure. If we do not reach you, we will leave a message.  We will attempt to reach you two times.  During this call, we will ask if you have developed any symptoms of COVID 19. If you develop any symptoms (ie: fever, flu-like symptoms, shortness of breath, cough etc.) before then, please call ([phone]  If you test positive for Covid 19 in the 2 weeks post procedure, please call and report this information to [facility]    If any biopsies were taken you will be contacted by phone or by letter within the next 1-3 weeks.  Please call [facility] ([phone] you have not heard about the biopsies in 3 weeks.   SIGNATURES/CONFIDENTIALITY: You and/or your  care partner have signed paperwork which will be entered into your electronic medical record.  These signatures attest to the fact that that the information above on your After Visit Summary has been reviewed and is understood.  Full responsibility of the confidentiality of this discharge information lies with you and/or your care-partner.

## 2020-07-29 NOTE — Progress Notes (Signed)
Vitals-CW  Pt's states no medical or surgical changes since previsit or office visit.  HIstory was reviewed anyway.

## 2020-07-29 NOTE — Op Note (Addendum)
[facility] Patient Name: Kimberly Obrien Procedure Date: 07/29/2020 2:43 PM MRN: [id number] Endoscopist: Remo Lipps P. Havery Moros , MD Age: 59 Referring MD:  Date of Birth: [birth year] Gender: Female Account #: [id number] Procedure:                Colonoscopy Indications:              High risk colon cancer surveillance: Personal                            history of colonic polyps (2018 - multiple adenomas                            removed per Dr. Leonie [facility]) Medicines:                Monitored Anesthesia Care Procedure:                Pre-Anesthesia Assessment:                           - Prior to the procedure, a History and Physical                            was performed, and patient medications and                            allergies were reviewed. The patient's tolerance of                            previous anesthesia was also reviewed. The risks                            and benefits of the procedure and the sedation                            options and risks were discussed with the patient.                            All questions were answered, and informed consent                            was obtained. Prior Anticoagulants: The patient has                            taken no previous anticoagulant or antiplatelet                            agents. ASA Grade Assessment: III - A patient with                            severe systemic disease. After reviewing the risks                            and benefits, the patient was deemed in  satisfactory condition to undergo the procedure.                           After obtaining informed consent, the colonoscope                            was passed under direct vision. Throughout the                            procedure, the patient's blood pressure, pulse, and                            oxygen saturations were monitored continuously. The                             Colonoscope was introduced through the anus and                            advanced to the the cecum, identified by                            appendiceal orifice and ileocecal valve. The                            colonoscopy was performed without difficulty. The                            patient tolerated the procedure well. The quality                            of the bowel preparation was adequate. The                            ileocecal valve, appendiceal orifice, and rectum                            were photographed. Scope In: 3:02:41 PM Scope Out: 3:25:49 PM Scope Withdrawal Time: 0 hours 19 minutes 32 seconds  Total Procedure Duration: 0 hours 23 minutes 8 seconds  Findings:                 The perianal and digital rectal examinations were                            normal.                           A 5 mm polyp was found in the ascending colon. The                            polyp was sessile. The polyp was removed with a                            cold snare. Resection and retrieval were complete.  Two sessile polyps were found in the hepatic                            flexure. The polyps were 5 mm in size. These polyps                            were removed with a cold snare. Resection and                            retrieval were complete.                           Five sessile polyps were found in the transverse                            colon. The polyps were 4 to 8 mm in size. These                            polyps were removed with a cold snare. Resection                            and retrieval were complete.                           Two sessile polyps were found in the descending                            colon. The polyps were 4 to 5 mm in size. These                            polyps were removed with a cold snare. Resection                            and retrieval were complete.                           Four sessile polyps were  found in the sigmoid                            colon. The polyps were 3 to 4 mm in size. These                            polyps were removed with a cold snare. Resection                            and retrieval were complete.                           Two sessile polyps were found in the rectum. The                            polyps were 3 to 4 mm in size. These polyps were  removed with a cold snare. Resection and retrieval                            were complete.                           A few small localized angiodysplastic lesions with                            stigmata of recent bleeding were found in the                            ascending colon.                           Internal hemorrhoids were found during                            retroflexion. The hemorrhoids were medium-sized.                           The exam was otherwise without abnormality. Complications:            No immediate complications. Estimated blood loss:                            Minimal. Estimated Blood Loss:     Estimated blood loss was minimal. Impression:               - One 5 mm polyp in the ascending colon, removed                            with a cold snare. Resected and retrieved.                           - Two 5 mm polyps at the hepatic flexure, removed                            with a cold snare. Resected and retrieved.                           - Five 4 to 8 mm polyps in the transverse colon,                            removed with a cold snare. Resected and retrieved.                           - Two 4 to 5 mm polyps in the descending colon,                            removed with a cold snare. Resected and retrieved.                           - Four 3 to 4 mm polyps in the sigmoid colon,  removed with a cold snare. Resected and retrieved.                           - Two 3 to 4 mm polyps in the rectum, removed with                             a cold snare. Resected and retrieved.                           - A few recently bleeding colonic angiodysplastic                            lesions.                           - Internal hemorrhoids.                           - The examination was otherwise normal. Recommendation:           - Patient has a contact number available for                            emergencies. The signs and symptoms of potential                            delayed complications were discussed with the                            patient. Return to normal activities tomorrow.                            Written discharge instructions were provided to the                            patient.                           - Resume previous diet.                           - Continue present medications.                           - Await pathology results. Remo Lipps P. Francheska Villeda, MD 07/29/2020 3:36:23 PM This report has been signed electronically. Addendum Number: 1   Addendum Date: 08/04/2020 1:40:10 PM      Correction: AVMs in the colon were WITHOUT stigmata of bleeding, not       with. That was a typo [employer]. Blakeley Margraf, MD 08/04/2020 1:40:57 PM This report has been signed electronically.

## 2020-07-29 NOTE — Op Note (Signed)
[facility] Patient Name: Kimberly Obrien Procedure Date: 07/29/2020 2:43 PM MRN: [id number] Endoscopist: Remo Lipps P. Havery Moros , MD Age: 59 Referring MD:  Date of Birth: April 28, 1961 Gender: Female Account #: [id number] Procedure:                Upper GI endoscopy Indications:              Cirrhosis rule out esophageal varices Medicines:                Monitored Anesthesia Care Procedure:                Pre-Anesthesia Assessment:                           - Prior to the procedure, a History and Physical                            was performed, and patient medications and                            allergies were reviewed. The patient's tolerance of                            previous anesthesia was also reviewed. The risks                            and benefits of the procedure and the sedation                            options and risks were discussed with the patient.                            All questions were answered, and informed consent                            was obtained. Prior Anticoagulants: The patient has                            taken no previous anticoagulant or antiplatelet                            agents. ASA Grade Assessment: III - A patient with                            severe systemic disease. After reviewing the risks                            and benefits, the patient was deemed in                            satisfactory condition to undergo the procedure.                           After obtaining informed consent, the endoscope was  passed under direct vision. Throughout the                            procedure, the patient's blood pressure, pulse, and                            oxygen saturations were monitored continuously. The                            Endoscope was introduced through the mouth, and                            advanced to the second part of duodenum. The upper                            GI  endoscopy was accomplished without difficulty.                            The patient tolerated the procedure well. Scope In: Scope Out: Findings:                 Esophagogastric landmarks were identified: the                            Z-line was found at 39 cm, the gastroesophageal                            junction was found at 39 cm and the upper extent of                            the gastric folds was found at 40 cm from the                            incisors.                           A 1 cm hiatal hernia was present.                           Small (< 5 mm) varices were found in the lower                            third of the esophagus without any high risk                            stigmata for bleeding                           The exam of the esophagus was otherwise normal.                           Moderate suspected portal hypertensive gastropathy  was found in the entire examined stomach -                            snakeskin appearance with erythema and adherent                            heme. Biopsies were taken with a cold forceps for                            Helicobacter pylori testing.                           A single 4-5 mm sessile polyp was found in the                            gastric fundus with a yellowish hue. Biopsies were                            taken with a cold forceps for histology.                           The exam of the stomach was otherwise normal. No                            gastric varices                           The duodenal bulb and second portion of the                            duodenum were normal. Complications:            No immediate complications. Estimated blood loss:                            Minimal. Estimated Blood Loss:     Estimated blood loss was minimal. Impression:               - Esophagogastric landmarks identified.                           - 1 cm hiatal hernia.                            - Small (< 5 mm) esophageal varices.                           - Normal esophagus otherwise                           - Portal hypertensive gastropathy. Biopsied to rule                            out H pylori.                           -  A single gastric polyp. Biopsied.                           - Normal stomach otherwise - no gastric varices.                           - Normal duodenal bulb and second portion of the                            duodenum. Recommendation:           - Patient has a contact number available for                            emergencies. The signs and symptoms of potential                            delayed complications were discussed with the                            patient. Return to normal activities tomorrow.                            Written discharge instructions were provided to the                            patient.                           - Resume previous diet.                           - Continue present medications.                           - Consideration for starting propranolol or nadolol                            for varices / portal hypertensive gastritis,                            however given the patient's underlying COPD unclear                            how this may be tolerated. Otherwise could observe                            and repeat an EGD in a year. I will discuss options                            with the patient                           - Await pathology results.                           -  Avoid NSAIDs                           - May benefit from omeprazole 70m / day for                            gastritis SRemo LippsP. Deke Tilghman, MD 07/29/2020 3:43:43 PM This report has been signed electronically.

## 2020-07-31 ENCOUNTER — Other Ambulatory Visit: Payer: Self-pay | Admitting: Family Medicine

## 2020-07-31 DIAGNOSIS — E119 Type 2 diabetes mellitus without complications: Secondary | ICD-10-CM

## 2020-08-02 ENCOUNTER — Telehealth: Payer: Self-pay

## 2020-08-02 NOTE — Telephone Encounter (Signed)
  Follow up Call-  Call back number 07/29/2020 07/29/2020  Post procedure Call Back phone  # [phone] -  Permission to leave phone message Yes Yes  Some recent data might be hidden     Patient questions:  Do you have a fever, pain , or abdominal swelling? No. Pain Score  0 *  Have you tolerated food without any problems? Yes.    Have you been able to return to your normal activities? Yes.    Do you have any questions about your discharge instructions: Diet   No. Medications  No. Follow up visit  No.  Do you have questions or concerns about your Care? No.  Actions: * If pain score is 4 or above: No action needed, pain <4.  1. Have you developed a fever since your procedure? No  2.   Have you had an respiratory symptoms (SOB or cough) since your procedure? No  3.   Have you tested positive for COVID 19 since your procedure No 4.   Have you had any family members/close contacts diagnosed with the COVID 19 since your procedure?  No   If yes to any of these questions please route to Joylene John, RN and Joella Prince, RN

## 2020-08-05 ENCOUNTER — Other Ambulatory Visit: Payer: Self-pay

## 2020-08-05 DIAGNOSIS — Z86010 Personal history of colonic polyps: Secondary | ICD-10-CM

## 2020-08-05 DIAGNOSIS — K635 Polyp of colon: Secondary | ICD-10-CM

## 2020-08-11 ENCOUNTER — Ambulatory Visit: Payer: Medicare Other | Admitting: Family Medicine

## 2020-08-13 ENCOUNTER — Other Ambulatory Visit: Payer: Self-pay | Admitting: Family Medicine

## 2020-08-17 ENCOUNTER — Telehealth: Payer: Self-pay | Admitting: Hematology

## 2020-08-17 NOTE — Telephone Encounter (Signed)
Scheduled apt per 12/28 sch msg - unable to reach pt. Left message for patient with appt date and time

## 2020-08-18 ENCOUNTER — Other Ambulatory Visit: Payer: Self-pay

## 2020-08-18 ENCOUNTER — Ambulatory Visit (INDEPENDENT_AMBULATORY_CARE_PROVIDER_SITE_OTHER): Payer: Medicare Other | Admitting: Family Medicine

## 2020-08-18 ENCOUNTER — Encounter: Payer: Self-pay | Admitting: Family Medicine

## 2020-08-18 VITALS — BP 121/71 | HR 95 | Temp 98.8°F | Ht 66.0 in | Wt 207.0 lb

## 2020-08-18 DIAGNOSIS — J432 Centrilobular emphysema: Secondary | ICD-10-CM | POA: Diagnosis not present

## 2020-08-18 DIAGNOSIS — E785 Hyperlipidemia, unspecified: Secondary | ICD-10-CM

## 2020-08-18 DIAGNOSIS — I739 Peripheral vascular disease, unspecified: Secondary | ICD-10-CM | POA: Diagnosis not present

## 2020-08-18 DIAGNOSIS — E119 Type 2 diabetes mellitus without complications: Secondary | ICD-10-CM

## 2020-08-18 LAB — BAYER DCA HB A1C WAIVED: HB A1C (BAYER DCA - WAIVED): 5.1 % (ref <7.0)

## 2020-08-18 MED ORDER — METFORMIN HCL ER 750 MG PO TB24: ORAL_TABLET | ORAL | 1 refills | Status: DC

## 2020-08-18 NOTE — Progress Notes (Signed)
Subjective:  Patient ID: Kimberly Obrien, female    DOB: 16-Apr-1961  Age: 58 y.o. MRN: [id number]  CC: Diabetes (6 mths )   HPI Kimberly Obrien presents forFollow-up of diabetes. Patient checks blood sugar at home.   115-130 fasting Patient denies symptoms such as polyuria, polydipsia, excessive hunger, nausea No significant hypoglycemic spells noted. Medications reviewed. Pt reports taking them regularly without complication/adverse reaction being reported today.  She reports that she has had more fatigue likely due to allergy.  She also says that the Symbicort was too expensive running greater than $100 a month.  As result she does have some dyspnea on exertion from lack of use of that medicine.  She does not feel that the Ventolin is effective for her.  History Kimberly Obrien has a past medical history of Anemia, Anticoagulation monitoring by pharmacist, Arthritis, Asthma, Blood transfusion without reported diagnosis, Cataract, Cirrhosis ([place]), COPD (chronic obstructive pulmonary disease) ([place]), Diabetes mellitus without complication ([place]), Dyspnea, Elevated hemoglobin A1c (03/01/2018), History of pulmonary embolism (02/04/2013), Hyperlipidemia, NASH (nonalcoholic steatohepatitis), Neuromuscular disorder ([place]), PE (pulmonary embolism), and Sleep apnea.   She has a past surgical history that includes Tubal ligation; Wrist surgery (Right); Cholecystectomy; Spine surgery; Abdominal hysterectomy; Eye surgery; left and right heart catheterization with coronary angiogram (N/A, 05/13/2014); Incontinence surgery (2009); Cataract extraction, bilateral (Bilateral); Esophagogastroduodenoscopy (egd) with propofol (N/A, 05/07/2017); Colonoscopy with propofol (N/A, 05/07/2017); IR Venogram Hepatic W Hemodynamic Evaluation (03/15/2018); IR Transcatheter BX (03/15/2018); and Breast biopsy.   Her family history includes Bipolar disorder in her father; Cirrhosis in her mother; Colon polyps in her sister; Heart  disease in her father; Heart failure in her father; Hyperlipidemia in her father; Hypertension in her brother; Irritable bowel syndrome in her mother and sister; Obesity in her daughter.She reports that she quit smoking about 8 years ago. Her smoking use included cigarettes. She started smoking about 43 years ago. She has a 100.00 pack-year smoking history. She has never used smokeless tobacco. She reports that she does not drink alcohol and does not use drugs.  Current Outpatient Medications on File Prior to Visit  Medication Sig Dispense Refill  . acetaminophen (TYLENOL) 500 MG tablet Take 500 mg by mouth every 6 (six) hours as needed.    Marland Kitchen albuterol (VENTOLIN HFA) 108 (90 Base) MCG/ACT inhaler Inhale 2 puffs into the lungs every 6 (six) hours as needed for wheezing or shortness of breath. 18 g 0  . Blood Glucose Monitoring Suppl (ONETOUCH VERIO REFLECT) w/Device KIT Test BS twice daily Dx e11.9 1 kit 0  . fluticasone (FLONASE) 50 MCG/ACT nasal spray Place 2 sprays into both nostrils daily. 16 g 11  . furosemide (LASIX) 20 MG tablet Take 1 tablet (20 mg total) by mouth as needed. 30 tablet 3  . ipratropium-albuterol (DUONEB) 0.5-2.5 (3) MG/3ML SOLN Take 3 mLs by nebulization every 6 (six) hours as needed. 360 mL 1  . Lancets (ONETOUCH DELICA PLUS [id number]) MISC CHECK BLOOD SUGAR TWICE  DAILY AND AS NEEDED Dx E11.9 200 each 3  . ONETOUCH VERIO test strip TEST BLOOD SUGAR TWICE  DAILY 200 strip 3  . traZODone (DESYREL) 150 MG tablet Use from 1/3 to 1 tablet nightly as needed for sleep. 90 tablet 1  . triamcinolone cream (KENALOG) 0.1 % Apply 1 application topically 3 (three) times daily. Avoid face and genitalia 45 g 0  . omeprazole (PRILOSEC) 20 MG capsule Take 1 capsule (20 mg total) by mouth daily. (Patient not taking: Reported on 08/18/2020) 30  capsule 0   No current facility-administered medications on file prior to visit.    ROS Review of Systems  Constitutional: Negative.   HENT:  Negative.   Eyes: Negative for visual disturbance.  Respiratory: Negative for shortness of breath.   Cardiovascular: Negative for chest pain.  Gastrointestinal: Negative for abdominal pain.  Musculoskeletal: Negative for arthralgias.    Objective:  BP 121/71   Pulse 95   Temp 98.8 F (37.1 C) (Temporal)   Ht '5\' 6"'  (1.676 m)   Wt 207 lb (93.9 kg)   BMI 33.41 kg/m   BP Readings from Last 3 Encounters:  08/18/20 121/71  07/29/20 (!) 121/46  07/12/20 118/70    Wt Readings from Last 3 Encounters:  08/18/20 207 lb (93.9 kg)  07/29/20 207 lb (93.9 kg)  07/12/20 207 lb (93.9 kg)     Physical Exam Constitutional:      General: She is not in acute distress.    Appearance: She is well-developed.  HENT:     Head: Normocephalic and atraumatic.  Eyes:     Conjunctiva/sclera: Conjunctivae normal.     Pupils: Pupils are equal, round, and reactive to light.  Neck:     Thyroid: No thyromegaly.  Cardiovascular:     Rate and Rhythm: Normal rate and regular rhythm.     Heart sounds: Normal heart sounds. No murmur heard.   Pulmonary:     Effort: Pulmonary effort is normal. No respiratory distress.     Breath sounds: Normal breath sounds. No wheezing or rales.  Abdominal:     General: Bowel sounds are normal. There is no distension.     Palpations: Abdomen is soft.     Tenderness: There is no abdominal tenderness.  Musculoskeletal:        General: Normal range of motion.     Cervical back: Normal range of motion and neck supple.  Lymphadenopathy:     Cervical: No cervical adenopathy.  Skin:    General: Skin is warm and dry.  Neurological:     Mental Status: She is alert and oriented to person, place, and time.  Psychiatric:        Behavior: Behavior normal.        Thought Content: Thought content normal.        Judgment: Judgment normal.       Assessment & Plan:   Kimberly Obrien was seen today for diabetes.  Diagnoses and all orders for this visit:  Diabetes mellitus  without complication ([place]) -     Bayer DCA Hb A1c Waived -     CBC with Differential/Platelet -     metFORMIN (GLUCOPHAGE-XR) 750 MG 24 hr tablet; One twice daily with food -     Microalbumin / creatinine urine ratio  Hyperlipidemia, unspecified hyperlipidemia type -     CMP14+EGFR -     Lipid panel  Intermittent claudication (HCC)  Centrilobular emphysema ([place])      I have discontinued Kimberly Obrien's budesonide-formoterol. I have also changed her metFORMIN. Additionally, I am having her maintain her acetaminophen, ipratropium-albuterol, fluticasone, triamcinolone, OneTouch Verio Reflect, albuterol, traZODone, furosemide, OneTouch Delica Plus [id number], omeprazole, and OneTouch Verio.  Meds ordered this encounter  Medications  . metFORMIN (GLUCOPHAGE-XR) 750 MG 24 hr tablet    Sig: One twice daily with food    Dispense:  180 tablet    Refill:  1     Follow-up: Return in about 3 months (around 11/16/2020).  Claretta Fraise, M.D.

## 2020-08-19 LAB — CMP14+EGFR
ALT: 28 IU/L (ref 0–32)
AST: 36 IU/L (ref 0–40)
Albumin/Globulin Ratio: 1.7 (ref 1.2–2.2)
Albumin: 3.7 g/dL — ABNORMAL LOW (ref 3.8–4.9)
Alkaline Phosphatase: 86 IU/L (ref 44–121)
BUN/Creatinine Ratio: 16 (ref 9–23)
BUN: 9 mg/dL (ref 6–24)
Bilirubin Total: 1.9 mg/dL — ABNORMAL HIGH (ref 0.0–1.2)
CO2: 23 mmol/L (ref 20–29)
Calcium: 8.8 mg/dL (ref 8.7–10.2)
Chloride: 107 mmol/L — ABNORMAL HIGH (ref 96–106)
Creatinine, Ser: 0.57 mg/dL (ref 0.57–1.00)
GFR calc Af Amer: 117 mL/min/{1.73_m2} (ref >59)
GFR calc non Af Amer: 102 mL/min/{1.73_m2} (ref >59)
Globulin, Total: 2.2 g/dL (ref 1.5–4.5)
Glucose: 240 mg/dL — ABNORMAL HIGH (ref 65–99)
Potassium: 3.9 mmol/L (ref 3.5–5.2)
Sodium: 141 mmol/L (ref 134–144)
Total Protein: 5.9 g/dL — ABNORMAL LOW (ref 6.0–8.5)

## 2020-08-19 LAB — MICROALBUMIN / CREATININE URINE RATIO
Creatinine, Urine: 159 mg/dL
Microalb/Creat Ratio: 10 mg/g creat (ref 0–29)
Microalbumin, Urine: 15.4 ug/mL

## 2020-08-19 LAB — CBC WITH DIFFERENTIAL/PLATELET
Basophils Absolute: 0 10*3/uL (ref 0.0–0.2)
Basos: 0 %
EOS (ABSOLUTE): 0.1 10*3/uL (ref 0.0–0.4)
Eos: 1 %
Hematocrit: 42.2 % (ref 34.0–46.6)
Hemoglobin: 14 g/dL (ref 11.1–15.9)
Immature Grans (Abs): 0 10*3/uL (ref 0.0–0.1)
Immature Granulocytes: 0 %
Lymphocytes Absolute: 0.9 10*3/uL (ref 0.7–3.1)
Lymphs: 18 %
MCH: 28.8 pg (ref 26.6–33.0)
MCHC: 33.2 g/dL (ref 31.5–35.7)
MCV: 87 fL (ref 79–97)
Monocytes Absolute: 0.3 10*3/uL (ref 0.1–0.9)
Monocytes: 5 %
Neutrophils Absolute: 3.7 10*3/uL (ref 1.4–7.0)
Neutrophils: 76 %
Platelets: 97 10*3/uL — CL (ref 150–450)
RBC: 4.86 x10E6/uL (ref 3.77–5.28)
RDW: 13.8 % (ref 11.7–15.4)
WBC: 4.9 10*3/uL (ref 3.4–10.8)

## 2020-08-19 LAB — LIPID PANEL
Chol/HDL Ratio: 2.5 ratio (ref 0.0–4.4)
Cholesterol, Total: 145 mg/dL (ref 100–199)
HDL: 59 mg/dL (ref >39)
LDL Chol Calc (NIH): 65 mg/dL (ref 0–99)
Triglycerides: 122 mg/dL (ref 0–149)
VLDL Cholesterol Cal: 21 mg/dL (ref 5–40)

## 2020-08-22 NOTE — Progress Notes (Signed)
Hello [name],  Your lab result is normal and/or stable.Some minor variations that are not significant are commonly marked abnormal, but do not represent any medical problem for you.  Best regards, Claretta Fraise, M.D.

## 2020-09-06 ENCOUNTER — Ambulatory Visit: Payer: Medicare Other | Admitting: Cardiovascular Disease

## 2020-09-07 ENCOUNTER — Inpatient Hospital Stay: Payer: Medicare Other

## 2020-09-07 ENCOUNTER — Inpatient Hospital Stay: Payer: Medicare Other | Attending: Hematology | Admitting: Genetic Counselor

## 2020-09-07 DIAGNOSIS — Z807 Family history of other malignant neoplasms of lymphoid, hematopoietic and related tissues: Secondary | ICD-10-CM

## 2020-09-07 DIAGNOSIS — Z806 Family history of leukemia: Secondary | ICD-10-CM

## 2020-09-07 DIAGNOSIS — Z800 Family history of malignant neoplasm of digestive organs: Secondary | ICD-10-CM | POA: Diagnosis not present

## 2020-09-07 DIAGNOSIS — Z8049 Family history of malignant neoplasm of other genital organs: Secondary | ICD-10-CM | POA: Diagnosis not present

## 2020-09-07 DIAGNOSIS — Z801 Family history of malignant neoplasm of trachea, bronchus and lung: Secondary | ICD-10-CM

## 2020-09-07 DIAGNOSIS — Z86010 Personal history of colonic polyps: Secondary | ICD-10-CM

## 2020-09-08 ENCOUNTER — Encounter: Payer: Self-pay | Admitting: Genetic Counselor

## 2020-09-08 DIAGNOSIS — Z806 Family history of leukemia: Secondary | ICD-10-CM | POA: Insufficient documentation

## 2020-09-08 DIAGNOSIS — Z801 Family history of malignant neoplasm of trachea, bronchus and lung: Secondary | ICD-10-CM | POA: Insufficient documentation

## 2020-09-08 DIAGNOSIS — Z86010 Personal history of colonic polyps: Secondary | ICD-10-CM | POA: Insufficient documentation

## 2020-09-08 DIAGNOSIS — Z807 Family history of other malignant neoplasms of lymphoid, hematopoietic and related tissues: Secondary | ICD-10-CM | POA: Insufficient documentation

## 2020-09-08 DIAGNOSIS — Z800 Family history of malignant neoplasm of digestive organs: Secondary | ICD-10-CM | POA: Insufficient documentation

## 2020-09-08 DIAGNOSIS — Z8049 Family history of malignant neoplasm of other genital organs: Secondary | ICD-10-CM | POA: Insufficient documentation

## 2020-09-08 NOTE — Progress Notes (Signed)
REFERRING PROVIDER: Yetta Flock, MD [address] [place],  [place] [address]  PRIMARY PROVIDER:  Claretta Fraise, MD  PRIMARY REASON FOR VISIT:  1. History of colon polyps   2. Family history of uterine cancer   3. Family history of stomach cancer   4. Family history of lung cancer   5. Family history of Hodgkin's lymphoma   6. Family history of leukemia      I connected with Ms. Petrey on 09/07/2020 at 2:00 pm EDT by video conference and verified that I am speaking with the correct person using two identifiers.   Patient location: Home Provider location: [place] [facility]  HISTORY OF PRESENT ILLNESS:   Ms. Fannin, a 60 y.o. female, was seen for a [facility] cancer genetics consultation at the request of Dr. Havery Moros due to a personal history of colon polyps.  Ms. Lackman presents to clinic today to discuss the possibility of a hereditary predisposition to colon polyps and/or cancer, genetic testing, and to further clarify her future polyp/cancer risks, as well as potential polyp/cancer risks for family members.   Ms. Fauteux does not have a personal history of cancer. She has a history of 21 tubular adenomas across her past three colonoscopies (07/29/2020, 05/07/2017, and 08/01/2013).  RISK FACTORS:  Menarche was at age 43.5.  First live birth at age 4.  OCP use for approximately 3 years.  Ovaries intact: yes.  Hysterectomy: yes.  Menopausal status: postmenopausal.  HRT use: 0 years. Colonoscopy: yes; 21 cumulative tubular adenomas. Mammogram within the last year: yes. Up to date with pelvic exams: yes. Any excessive radiation exposure in the past: no.   Past Medical History:  Diagnosis Date  . Anemia   . Anticoagulation monitoring by pharmacist   . Arthritis   . Asthma   . Blood transfusion without reported diagnosis   . Cataract   . Cirrhosis ([place])   . COPD (chronic obstructive pulmonary disease) ([place])   . Diabetes mellitus  without complication ([facility])   . Dyspnea    exertion  . Elevated hemoglobin A1c 03/01/2018  . Family history of Hodgkin's lymphoma   . Family history of leukemia   . Family history of lung cancer   . Family history of stomach cancer   . Family history of uterine cancer   . History of colon polyps   . History of pulmonary embolism 02/04/2013  . Hyperlipidemia   . NASH (nonalcoholic steatohepatitis)   . Neuromuscular disorder ([place])    back surgery  . PE (pulmonary embolism)   . Sleep apnea     Past Surgical History:  Procedure Laterality Date  . ABDOMINAL HYSTERECTOMY    . BREAST BIOPSY    . CATARACT EXTRACTION, BILATERAL Bilateral   . CHOLECYSTECTOMY    . COLONOSCOPY WITH PROPOFOL N/A 05/07/2017   Procedure: COLONOSCOPY WITH PROPOFOL;  Surgeon: Laurence Spates, MD;  Location: [facility];  Service: Endoscopy;  Laterality: N/A;  . ESOPHAGOGASTRODUODENOSCOPY (EGD) WITH PROPOFOL N/A 05/07/2017   Procedure: ESOPHAGOGASTRODUODENOSCOPY (EGD) WITH PROPOFOL;  Surgeon: Laurence Spates, MD;  Location: [facility];  Service: Endoscopy;  Laterality: N/A;  . EYE SURGERY     Cataracts  . INCONTINENCE SURGERY  2009  . IR TRANSCATHETER BX  03/15/2018  . IR VENOGRAM HEPATIC W HEMODYNAMIC EVALUATION  03/15/2018  . LEFT AND RIGHT HEART CATHETERIZATION WITH CORONARY ANGIOGRAM N/A 05/13/2014   Procedure: LEFT AND RIGHT HEART CATHETERIZATION WITH CORONARY ANGIOGRAM;  Surgeon: [name], MD;  Location: [facility];  Service: Cardiovascular;  Laterality: N/A;  . SPINE SURGERY     L5 S1 fusion  . TUBAL LIGATION    . WRIST SURGERY Right    Had surgery twice to shave bone for blood circulation improvement.    Social History   Socioeconomic History  . Marital status: Married    Spouse name: Shanon Brow  . Number of children: 4  . Years of education: GED  . Highest education level: GED or equivalent  Occupational History  . Occupation: Unemployed  Tobacco Use  . Smoking status: Former Smoker     Packs/day: 2.00    Years: 50.00    Pack years: 100.00    Types: Cigarettes    Start date: 08/22/1977    Quit date: 04/21/2012    Years since quitting: 8.3  . Smokeless tobacco: Never Used  Vaping Use  . Vaping Use: Never used  Substance and Sexual Activity  . Alcohol use: No  . Drug use: No  . Sexual activity: Not Currently    Birth control/protection: Surgical  Other Topics Concern  . Not on file  Social History Narrative  . Not on file   Social Determinants of Health   Financial Resource Strain: Low Risk   . Difficulty of Paying Living Expenses: Not very hard  Food Insecurity: No Food Insecurity  . Worried About Charity fundraiser in the Last Year: Never true  . Ran Out of Food in the Last Year: Never true  Transportation Needs: No Transportation Needs  . Lack of Transportation (Medical): No  . Lack of Transportation (Non-Medical): No  Physical Activity: Inactive  . Days of Exercise per Week: 0 days  . Minutes of Exercise per Session: 0 min  Stress: Stress Concern Present  . Feeling of Stress : To some extent  Social Connections: Moderately Integrated  . Frequency of Communication with Friends and Family: More than three times a week  . Frequency of Social Gatherings with Friends and Family: More than three times a week  . Attends Religious Services: More than 4 times per year  . Active Member of Clubs or Organizations: No  . Attends Archivist Meetings: Never  . Marital Status: Married     FAMILY HISTORY:  We obtained a detailed, 4-generation family history.  Significant diagnoses are listed below: Family History  Problem Relation Age of Onset  . Cirrhosis Mother        NASH  . Irritable bowel syndrome Mother   . Hyperlipidemia Father   . Heart failure Father   . Bipolar disorder Father   . Heart disease Father   . Obesity Daughter   . Endometrial cancer Daughter 70  . Hypertension Brother   . Colon polyps Sister        unknown number but fewer  than patient  . Irritable bowel syndrome Sister   . Heart failure Paternal Grandfather   . Stomach cancer Maternal Uncle        dx 40s/50s  . Cancer Paternal Aunt        NOS  . Cancer Paternal Uncle        NOS  . Lung cancer Maternal Uncle        dx 40s/50s, smoker  . Cancer Maternal Uncle        NOS  . Cancer Paternal Aunt        NOS  . Leukemia Cousin        dx 56s, died 2  weeks after diagnosis (maternal first cousin)  . Cancer Cousin        NOS (paternal first cousin)  . Hodgkin's lymphoma Other 14       4th degree relative (mother's half-sibling's son)  . Heart attack Neg Hx   . Miscarriages / Stillbirths Neg Hx   . Colon cancer Neg Hx   . Esophageal cancer Neg Hx   . Rectal cancer Neg Hx    Ms. Wubben has one daughter (age 60) and three sons (ages 33-44). Her daughter has a history of endometrial cancer diagnosed when she was 51. Ms. Walts has one sister and had two brothers. Her sister has had colon polyps (unknown number).  Ms. Tello mother died at age 48 and did not have cancer, although she did have cirrhosis of the liver. Ms. Litsey mother had three full-brothers, two full-sisters, one paternal half-sister, and three paternal half-brothers. Three of the full-brothers had cancer - one had stomach cancer diagnosed in his 56s or 66s, another had lung cancer diagnosed in his 32s or 62s and was a smoker, and the third had an unknown type of cancer. One first cousin died from leukemia in his 12s, approximately 2 weeks after diagnosis. She also notes that one of her mother's half-siblings had a son with Hodgkin's lymphoma at age 49. Ms. Hurtado maternal grandparents died older than 68 without cancer.  Ms. Placide's father died at age 67 and did not have cancer. She had ten paternal aunts/uncles total. Two paternal aunts had cancer and one paternal uncle had cancer, although Ms. Manalo does not know what type of cancer they had or their ages of diagnosis. One  paternal first cousin died from an unknown cancer in his late 12s. Her paternal grandmother died in her 73s from old age, and her paternal grandfather died age 62 from heart failure.  Ms. Bobeck does not know of family members with colon polyps other than her sister.  Ms. Ebbs is unaware of previous family history of genetic testing for hereditary cancer risks. Patient's maternal ancestors are of possibly [place] descent, and paternal ancestors are of [place] descent. There is no reported Ashkenazi Jewish ancestry. There is no known consanguinity.  GENETIC COUNSELING ASSESSMENT: Ms. Sonnen is a 60 y.o. female with a personal history of 21 adenomatous colon polyps, and a family history of young-onset endometrial cancer, stomach cancer, lung cancer, and leukemia, which is somewhat suggestive of a hereditary cancer syndrome and predisposition to cancer. We, therefore, discussed and recommended the following at today's visit.   DISCUSSION:  We discussed that polyps in general are common; however, most people have fewer than 5 lifetime polyps. When an individual has 10 or more polyps we become concerned about an underlying polyposis syndrome. The most common hereditary polyposis syndromes are Familial Adenomatous Polyposis (FAP), caused by mutations in the APC gene, and MUTYH-Associated Polyposis (MAP), caused by mutations in the MUTYH gene. There are other genes that are associated with polyposis, such as NTHL1 and MSH3. We discussed that testing is beneficial for several reasons, including knowing about cancer risks, identifying potential screening and risk-reduction options that may be appropriate, and to understand if other family members could be at risk for colon polyps and/or cancer and allow them to undergo genetic testing.   We reviewed the characteristics, features and inheritance patterns of hereditary polyposis/cancer syndromes. We also discussed genetic testing, including the appropriate  family members to test, the process of testing, insurance coverage and turn-around-time for results. We discussed  the implications of a negative, positive and/or variant of uncertain significant result. We recommended Ms. Rohwer pursue genetic testing for the [employer]  The CustomNext-Cancer+RNAinsight panel offered by [facility] includes sequencing and rearrangement analysis for the following 91 genes:  AIP, ALK, APC, ATM, AXIN2, BAP1, BARD1, BLM, BMPR1A, BRCA1, BRCA2, BRIP1, CDC73, CDH1, CDK4, CDKN1B, CDKN2A, CHEK2, CTNNA1, DICER1, FANCC, FH, FLCN, GALNT12, KIF1B, LZTR1, MAX, MEN1, MET, MLH1, MRE11A, MSH2, MSH3, MSH6, MUTYH, NBN, NF1, NF2, NTHL1, PALB2, PHOX2B, PMS2, POT1, PRKAR1A, PTCH1, PTEN, RAD50, RAD51C, RAD51D, RB1, RECQL, RET, SDHA, SDHAF2, SDHB, SDHC, SDHD, SMAD4, SMARCA4, SMARCB1, SMARCE1, STK11, SUFU, TMEM127, Tp53, TSC1, TSC2, VHL and XRCC2 (sequencing and deletion/duplication); CASR, CFTR, CPA1, CTRC, EGFR, EGLN1, FAM175A, HOXB13, KIT, MITF, MLH3, PALLD, PDGFRA, POLD1, POLE, PRSS1, RINT1, RPS20, SPINK1 and TERT (sequencing only); EPCAM and GREM1 (deletion/duplication only). RNA data is routinely analyzed for use in variant interpretation for all genes.   Based on Ms. Sanden's personal history of colon polyps and family history of cancer, she meets medical criteria for genetic testing. Despite that she meets criteria, she may still have an out of pocket cost. We discussed that if her out of pocket cost for testing is over $100, the laboratory will reach out to let her know. If the out of pocket cost of testing is less than $100 she will be billed by the genetic testing laboratory.   PLAN: After considering the risks, benefits, and limitations, Ms. Hester provided informed consent to pursue genetic testing. The blood sample will be drawn on 10/05/2020 and will be sent to [facility] for analysis of the CustomNext-Cancer + RNAinsight panel.  Results should be available within approximately two-three weeks' time, at which point they will be disclosed by telephone to Ms. Klang, as will any additional recommendations warranted by these results. Ms. Goedecke will receive a summary of her genetic counseling visit and a copy of her results once available. This information will also be available in Epic.   Based on Ms. Lacap's family history, we recommended her daughter, who was diagnosed with endometrial at age 1, have genetic counseling and testing. Ms. Bally will let us know if we can be of any assistance in coordinating genetic counseling and/or testing for this family member.   Ms. Schuelke questions were answered to her satisfaction today. Our contact information was provided should additional questions or concerns arise. Thank you for the referral and allowing [place] to share in the care of your patient.   Clint Guy, [place], [facility] Licensed, Certified Dispensing [email or web address] Phone: [phone]  The patient was seen for a total of 60 minutes in face-to-face genetic counseling.  This patient was discussed with Drs. Magrinat, Lindi Adie and/or Burr Medico who agrees with the above.    _______________________________________________________________________ For Office Staff:  Number of people involved in session: 2 Was an Intern/ student involved with case: yes

## 2020-09-28 ENCOUNTER — Ambulatory Visit (INDEPENDENT_AMBULATORY_CARE_PROVIDER_SITE_OTHER): Payer: Medicare Other | Admitting: *Deleted

## 2020-09-28 DIAGNOSIS — Z0000 Encounter for general adult medical examination without abnormal findings: Secondary | ICD-10-CM

## 2020-09-28 NOTE — Progress Notes (Signed)
MEDICARE ANNUAL WELLNESS VISIT  09/28/2020  Telephone Visit Disclaimer This Medicare AWV was conducted by telephone due to national recommendations for restrictions regarding the COVID-19 Pandemic (e.g. social distancing).  I verified, using two identifiers, that I am speaking with Kimberly Obrien or their authorized healthcare agent. I discussed the limitations, risks, security, and privacy concerns of performing an evaluation and management service by telephone and the potential availability of an in-person appointment in the future. The patient expressed understanding and agreed to proceed.  Location of Patient: home Location of Provider (nurse):  office  Subjective:    Kimberly Obrien is a 60 y.o. female patient of Stacks, Cletus Gash, MD who had a Medicare Annual Wellness Visit today via telephone. Kimberly Obrien is Disabled and lives with their spouse. she has 4 children. she reports that she is socially active and does interact with friends/family regularly. she is minimally physically active and enjoys playing games on her phone, doing word puzzles and watching her grandchildren play ball.  Patient Care Team: Claretta Fraise, MD as PCP - General (Family Medicine) [name], MD as PCP - Cardiology (Cardiology) Sinda Du, MD as Consulting Physician (Pulmonary Disease) Teena Irani, MD (Inactive) as Consulting Physician (Gastroenterology) [name], MD as Consulting Physician (Cardiology) Ilean China, RN as Case Manager  Advanced Directives 09/28/2020 09/25/2019 07/22/2018 03/22/2018 03/15/2018 11/27/2017 05/07/2017  Does Patient Have a Medical Advance Directive? _0  No No  Would patient like information on creating a medical advance directive? No - Patient declined No - Patient declined No - Patient declined No - Patient declined No - Patient declined No - Patient declined No - Patient declined    Hospital Utilization Over the Past 12 Months: #  of hospitalizations or ER visits: 0 # of surgeries: 0  Review of Systems    Patient reports that her overall health is unchanged compared to last year.  History obtained from chart review and the patient  Patient Reported Readings (BP, Pulse, CBG, Weight, etc) none  Pain Assessment Pain : 0-10 Pain Score: 4  Pain Type: Chronic pain Pain Relieving Factors: ice, heat, resting Effect of Pain on Daily Activities: moderate  Pain Relieving Factors: ice, heat, resting  Current Medications & Allergies (verified) Allergies as of 09/28/2020      Reactions   Aciphex [rabeprazole Sodium] Rash   Fluconazole Rash, Hives   Lansoprazole Hives   Sucralfate Nausea Only   Vancomycin Diarrhea   Claritin [loratadine] Other (See Comments)   Tired   Crestor [rosuvastatin] Other (See Comments)   Elevated blood glucose and abdominal pain   Doxycycline Rash   Ibuprofen Other (See Comments)   Upset stomach   Lipitor [atorvastatin] Other (See Comments)   Myalgias, leg pain.  Problems with pancreas, sugars went up also.   Macrobid [nitrofurantoin Macrocrystal] Nausea And Vomiting   Metoprolol Nausea And Vomiting   Metronidazole Other (See Comments), Nausea Only   Dizziness, nausea, dry mouth and some shortness of breath   Nsaids Other (See Comments)   Upset stomach   Omeprazole    bloating   Rabeprazole Rash   Septra [sulfamethoxazole-trimethoprim] Nausea And Vomiting      Medication List       Accurate as of September 28, 2020  3:11 PM. If you have any questions, ask your nurse or doctor.        STOP taking these medications   omeprazole 20 MG capsule Commonly known as: PRILOSEC  TAKE these medications   acetaminophen 500 MG tablet Commonly known as: TYLENOL Take 500 mg by mouth every 6 (six) hours as needed.   albuterol 108 (90 Base) MCG/ACT inhaler Commonly known as: VENTOLIN HFA Inhale 2 puffs into the lungs every 6 (six) hours as needed for wheezing or shortness of  breath.   fluticasone 50 MCG/ACT nasal spray Commonly known as: FLONASE Place 2 sprays into both nostrils daily.   furosemide 20 MG tablet Commonly known as: LASIX Take 1 tablet (20 mg total) by mouth as needed.   ipratropium-albuterol 0.5-2.5 (3) MG/3ML Soln Commonly known as: DUONEB Take 3 mLs by nebulization every 6 (six) hours as needed.   metFORMIN 750 MG 24 hr tablet Commonly known as: GLUCOPHAGE-XR One twice daily with food   OneTouch Delica Plus [id number] Misc CHECK BLOOD SUGAR TWICE  DAILY AND AS NEEDED Dx E11.9   OneTouch Verio Reflect w/Device Kit Test BS twice daily Dx e11.9   OneTouch Verio test strip Generic drug: glucose blood TEST BLOOD SUGAR TWICE  DAILY   traZODone 150 MG tablet Commonly known as: DESYREL Use from 1/3 to 1 tablet nightly as needed for sleep.   triamcinolone 0.1 % Commonly known as: KENALOG Apply 1 application topically 3 (three) times daily. Avoid face and genitalia       History (reviewed): Past Medical History:  Diagnosis Date  . Anemia   . Anticoagulation monitoring by pharmacist   . Arthritis   . Asthma   . Blood transfusion without reported diagnosis   . Cataract   . Cirrhosis ([place])   . COPD (chronic obstructive pulmonary disease) ([place])   . Diabetes mellitus without complication ([place])   . Dyspnea    exertion  . Elevated hemoglobin A1c 03/01/2018  . Family history of Hodgkin's lymphoma   . Family history of leukemia   . Family history of lung cancer   . Family history of stomach cancer   . Family history of uterine cancer   . History of colon polyps   . History of pulmonary embolism 02/04/2013  . Hyperlipidemia   . NASH (nonalcoholic steatohepatitis)   . Neuromuscular disorder ([place])    back surgery  . PE (pulmonary embolism)   . Sleep apnea    Past Surgical History:  Procedure Laterality Date  . ABDOMINAL HYSTERECTOMY    . BREAST BIOPSY    . CATARACT EXTRACTION, BILATERAL Bilateral   . CHOLECYSTECTOMY    .  COLONOSCOPY WITH PROPOFOL N/A 05/07/2017   Procedure: COLONOSCOPY WITH PROPOFOL;  Surgeon: Laurence Spates, MD;  Location: [facility] ENDOSCOPY;  Service: Endoscopy;  Laterality: N/A;  . ESOPHAGOGASTRODUODENOSCOPY (EGD) WITH PROPOFOL N/A 05/07/2017   Procedure: ESOPHAGOGASTRODUODENOSCOPY (EGD) WITH PROPOFOL;  Surgeon: Laurence Spates, MD;  Location: [facility] ENDOSCOPY;  Service: Endoscopy;  Laterality: N/A;  . EYE SURGERY     Cataracts  . INCONTINENCE SURGERY  2009  . IR TRANSCATHETER BX  03/15/2018  . IR VENOGRAM HEPATIC W HEMODYNAMIC EVALUATION  03/15/2018  . LEFT AND RIGHT HEART CATHETERIZATION WITH CORONARY ANGIOGRAM N/A 05/13/2014   Procedure: LEFT AND RIGHT HEART CATHETERIZATION WITH CORONARY ANGIOGRAM;  Surgeon: [name], MD;  Location: [facility] CATH LAB;  Service: Cardiovascular;  Laterality: N/A;  . SPINE SURGERY     L5 S1 fusion  . TUBAL LIGATION    . WRIST SURGERY Right    Had surgery twice to shave bone for blood circulation improvement.   Family History  Problem Relation Age of Onset  . Cirrhosis Mother  NASH  . Irritable bowel syndrome Mother   . Hyperlipidemia Father   . Heart failure Father   . Bipolar disorder Father   . Heart disease Father   . Obesity Daughter   . Endometrial cancer Daughter 61  . Hypertension Brother   . Colon polyps Sister        unknown number but fewer than patient  . Irritable bowel syndrome Sister   . Heart failure Paternal Grandfather   . Stomach cancer Maternal Uncle        dx 40s/50s  . Cancer Paternal Aunt        NOS  . Cancer Paternal Uncle        NOS  . Lung cancer Maternal Uncle        dx 40s/50s, smoker  . Cancer Maternal Uncle        NOS  . Cancer Paternal Aunt        NOS  . Leukemia Cousin        dx 44s, died 2 weeks after diagnosis (maternal first cousin)  . Cancer Cousin        NOS (paternal first cousin)  . Hodgkin's lymphoma Other 14       4th degree relative (mother's half-sibling's son)  . Heart attack Neg Hx   .  Miscarriages / Stillbirths Neg Hx   . Colon cancer Neg Hx   . Esophageal cancer Neg Hx   . Rectal cancer Neg Hx    Social History   Socioeconomic History  . Marital status: Married    Spouse name: Shanon Brow  . Number of children: 4  . Years of education: GED  . Highest education level: GED or equivalent  Occupational History  . Occupation: Unemployed  Tobacco Use  . Smoking status: Former Smoker    Packs/day: 2.00    Years: 50.00    Pack years: 100.00    Types: Cigarettes    Start date: 08/22/1977    Quit date: 04/21/2012    Years since quitting: 8.4  . Smokeless tobacco: Never Used  Vaping Use  . Vaping Use: Never used  Substance and Sexual Activity  . Alcohol use: No  . Drug use: No  . Sexual activity: Not Currently    Birth control/protection: Surgical  Other Topics Concern  . Not on file  Social History Narrative  . Not on file   Social Determinants of Health   Financial Resource Strain: Low Risk   . Difficulty of Paying Living Expenses: Not hard at all  Food Insecurity: No Food Insecurity  . Worried About Charity fundraiser in the Last Year: Never true  . Ran Out of Food in the Last Year: Never true  Transportation Needs: No Transportation Needs  . Lack of Transportation (Medical): No  . Lack of Transportation (Non-Medical): No  Physical Activity: Inactive  . Days of Exercise per Week: 0 days  . Minutes of Exercise per Session: 0 min  Stress: Stress Concern Present  . Feeling of Stress : To some extent  Social Connections: Moderately Isolated  . Frequency of Communication with Friends and Family: More than three times a week  . Frequency of Social Gatherings with Friends and Family: More than three times a week  . Attends Religious Services: Never  . Active Member of Clubs or Organizations: No  . Attends Archivist Meetings: Never  . Marital Status: Married    Activities of Daily Living In your present state of health, do you  have any difficulty  performing the following activities: 09/28/2020 02/18/2020  Hearing? N N  Vision? N Y  Comment - recent cataract surgery and dry eyes. Following up on 03/01/20  Difficulty concentrating or making decisions? N N  Walking or climbing stairs? Y N  Comment knee pain -  Dressing or bathing? N N  Doing errands, shopping? Y N  Comment she can't be up and on her feet long due to the knee and back pain -  Preparing Food and eating ? N N  Using the Toilet? N N  In the past six months, have you accidently leaked urine? Y N  Comment rarely-doesn't have to wear pantyliner or pad -  Do you have problems with loss of bowel control? N N  Managing your Medications? N N  Managing your Finances? N N  Housekeeping or managing your Housekeeping? Y N  Comment her husband does most of the housekeeping -  Some recent data might be hidden    Patient Education/ Literacy How often do you need to have someone help you when you read instructions, pamphlets, or other written materials from your doctor or pharmacy?: 1 - Never What is the last grade level you completed in school?: GED  Exercise Current Exercise Habits: The patient does not participate in regular exercise at present, Exercise limited by: orthopedic condition(s);respiratory conditions(s)  Diet Patient reports consuming 2 meals a day and 1 snack(s) a day Patient reports that her primary diet is: Regular Patient reports that she does have regular access to food.   Depression Screen PHQ 2/9 Scores 09/28/2020 08/18/2020 04/29/2020 02/12/2020 11/11/2019 09/25/2019 08/12/2019  PHQ - 2 Score 0 0 0 0 0 1 0  PHQ- 9 Score - - - - - - -     Fall Risk Fall Risk  09/28/2020 08/18/2020 08/18/2020 04/29/2020 02/18/2020  Falls in the past year? 0 0 0 0 0  Number falls in past yr: - - - 0 0  Injury with Fall? - - - 0 0  Risk for fall due to : - - - No Fall Risks -  Follow up - - - Falls evaluation completed -     Objective:  Kimberly Obrien seemed alert and  oriented and she participated appropriately during our telephone visit.  Blood Pressure Weight BMI  BP Readings from Last 3 Encounters:  08/18/20 121/71  07/29/20 (!) 121/46  07/12/20 118/70   Wt Readings from Last 3 Encounters:  08/18/20 207 lb (93.9 kg)  07/29/20 207 lb (93.9 kg)  07/12/20 207 lb (93.9 kg)   BMI Readings from Last 1 Encounters:  08/18/20 33.41 kg/m    *Unable to obtain current vital signs, weight, and BMI due to telephone visit type  Hearing/Vision  . Krisi did not seem to have difficulty with hearing/understanding during the telephone conversation . Reports that she has had a formal eye exam by an eye care professional within the past year . Reports that she has not had a formal hearing evaluation within the past year *Unable to fully assess hearing and vision during telephone visit type  Cognitive Function: 6CIT Screen 09/28/2020 09/25/2019  What Year? 0 points 0 points  What month? 0 points 0 points  What time? 0 points 0 points  Count back from 20 0 points 0 points  Months in reverse 0 points 0 points  Repeat phrase 0 points 0 points  Total Score 0 0   (Normal:0-7, Significant for Dysfunction: >8)  Normal Cognitive Function  Screening: Yes   Immunization & Health Maintenance Record Immunization History  Administered Date(s) Administered  . Tdap 04/21/2011, 04/20/2017    Health Maintenance  Topic Date Due  . COVID-19 Vaccine (1) Never done  . DEXA SCAN  01/01/2020  . INFLUENZA VACCINE  11/18/2020 (Originally 03/21/2020)  . PAP SMEAR-Modifier  12/31/2020  . COLONOSCOPY (Pts 45-54yr Insurance coverage will need to be confirmed)  07/29/2021  . URINE MICROALBUMIN  08/18/2021  . MAMMOGRAM  07/28/2022  . TETANUS/TDAP  04/21/2027  . Hepatitis C Screening  Completed  . HIV Screening  Completed       Assessment  This is a routine wellness examination for PRozanna Obrien  Health Maintenance: Due or Overdue Health Maintenance Due  Topic  Date Due  . COVID-19 Vaccine (1) Never done  . DEXA SCAN  01/01/2020    PRozanna Boerdoes not need a referral for Community Assistance: Care Management:   no Social Work:    no Prescription Assistance:  no Nutrition/Diabetes Education:  no   Plan:  Personalized Goals Goals Addressed              This Visit's Progress   .  Patient Stated (pt-stated)        I would like to be able to get the pain under control so I can move around more.      Personalized Health Maintenance & Screening Recommendations  Bone densitometry screening Advanced directives: has NO advanced directive - not interested in additional information COVID vaccines  Lung Cancer Screening Recommended: no (Low Dose CT Chest recommended if Age 60-80years, 30 pack-year currently smoking OR have quit w/in past 15 years) Hepatitis C Screening recommended: no HIV Screening recommended: no  Advanced Directives: Written information was not prepared per patient's request.  Referrals & Orders No orders of the defined types were placed in this encounter.   Follow-up Plan . Follow-up with SClaretta Fraise MD as planned . Keep all of your appointments with your Specialists . Schedule a DEXA as discussed . Consider COVID vaccines   I have personally reviewed and noted the following in the patient's chart:   . Medical and social history . Use of alcohol, tobacco or illicit drugs  . Current medications and supplements . Functional ability and status . Nutritional status . Physical activity . Advanced directives . List of other physicians . Hospitalizations, surgeries, and ER visits in previous 12 months . Vitals . Screenings to include cognitive, depression, and falls . Referrals and appointments  In addition, I have reviewed and discussed with PRozanna Boercertain preventive protocols, quality metrics, and best practice recommendations. A written personalized care plan for preventive services  as well as general preventive health recommendations is available and can be mailed to the patient at her request.      SMilas Hock LPN  [id number]

## 2020-09-28 NOTE — Patient Instructions (Signed)
Preventive Care 84-60 Years Old, Female Preventive care refers to lifestyle choices and visits with your health care provider that can promote health and wellness. This includes:  A yearly physical exam. This is also called an annual wellness visit.  Regular dental and eye exams.  Immunizations.  Screening for certain conditions.  Healthy lifestyle choices, such as: ? Eating a healthy diet. ? Getting regular exercise. ? Not using drugs or products that contain nicotine and tobacco. ? Limiting alcohol use. What can I expect for my preventive care visit? Physical exam Your health care provider will check your:  Height and weight. These may be used to calculate your BMI (body mass index). BMI is a measurement that tells if you are at a healthy weight.  Heart rate and blood pressure.  Body temperature.  Skin for abnormal spots. Counseling Your health care provider may ask you questions about your:  Past medical problems.  Family's medical history.  Alcohol, tobacco, and drug use.  Emotional well-being.  Home life and relationship well-being.  Sexual activity.  Diet, exercise, and sleep habits.  Work and work Statistician.  Access to firearms.  Method of birth control.  Menstrual cycle.  Pregnancy history. What immunizations do I need? Vaccines are usually given at various ages, according to a schedule. Your health care provider will recommend vaccines for you based on your age, medical history, and lifestyle or other factors, such as travel or where you work.   What tests do I need? Blood tests  Lipid and cholesterol levels. These may be checked every 5 years, or more often if you are over 60 years old.  Hepatitis C test.  Hepatitis B test. Screening  Lung cancer screening. You may have this screening every year starting at age 60 if you have a 30-pack-year history of smoking and currently smoke or have quit within the past 15 years.  Colorectal cancer  screening. ? All adults should have this screening starting at age 60 and continuing until age 60. ? Your health care provider may recommend screening at age 60 if you are at increased risk. ? You will have tests every 1-10 years, depending on your results and the type of screening test.  Diabetes screening. ? This is done by checking your blood sugar (glucose) after you have not eaten for a while (fasting). ? You may have this done every 1-3 years.  Mammogram. ? This may be done every 1-2 years. ? Talk with your health care provider about when you should start having regular mammograms. This may depend on whether you have a family history of breast cancer.  BRCA-related cancer screening. This may be done if you have a family history of breast, ovarian, tubal, or peritoneal cancers.  Pelvic exam and Pap test. ? This may be done every 3 years starting at age 60. ? Starting at age 60, this may be done every 5 years if you have a Pap test in combination with an HPV test. Other tests  STD (sexually transmitted disease) testing, if you are at risk.  Bone density scan. This is done to screen for osteoporosis. You may have this scan if you are at high risk for osteoporosis. Talk with your health care provider about your test results, treatment options, and if necessary, the need for more tests. Follow these instructions at home: Eating and drinking  Eat a diet that includes fresh fruits and vegetables, whole grains, lean protein, and low-fat dairy products.  Take vitamin and mineral supplements  as recommended by your health care provider.  Do not drink alcohol if: ? Your health care provider tells you not to drink. ? You are pregnant, may be pregnant, or are planning to become pregnant.  If you drink alcohol: ? Limit how much you have to 0-1 drink a day. ? Be aware of how much alcohol is in your drink. In the [place] one drink equals one 12 oz bottle of beer (355 mL), one 5 oz glass of  wine (148 mL), or one 1 oz glass of hard liquor (44 mL).   Lifestyle  Take daily care of your teeth and gums. Brush your teeth every morning and night with fluoride toothpaste. Floss one time each day.  Stay active. Exercise for at least 30 minutes 5 or more days each week.  Do not use any products that contain nicotine or tobacco, such as cigarettes, e-cigarettes, and chewing tobacco. If you need help quitting, ask your health care provider.  Do not use drugs.  If you are sexually active, practice safe sex. Use a condom or other form of protection to prevent STIs (sexually transmitted infections).  If you do not wish to become pregnant, use a form of birth control. If you plan to become pregnant, see your health care provider for a prepregnancy visit.  If told by your health care provider, take low-dose aspirin daily starting at age 50.  Find healthy ways to cope with stress, such as: ? Meditation, yoga, or listening to music. ? Journaling. ? Talking to a trusted person. ? Spending time with friends and family. Safety  Always wear your seat belt while driving or riding in a vehicle.  Do not drive: ? If you have been drinking alcohol. Do not ride with someone who has been drinking. ? When you are tired or distracted. ? While texting.  Wear a helmet and other protective equipment during sports activities.  If you have firearms in your house, make sure you follow all gun safety procedures. What's next?  Visit your health care provider once a year for an annual wellness visit.  Ask your health care provider how often you should have your eyes and teeth checked.  Stay up to date on all vaccines. This information is not intended to replace advice given to you by your health care provider. Make sure you discuss any questions you have with your health care provider. Document Revised: 05/11/2020 Document Reviewed: 04/18/2018 Elsevier Patient Education  2021 [employer]  

## 2020-10-04 NOTE — Progress Notes (Signed)
HEMATOLOGY CLINIC NOTE Patient Care Team: Claretta Fraise, MD as PCP - General (Family Medicine) [name], MD as PCP - Cardiology (Cardiology) Sinda Du, MD as Consulting Physician (Pulmonary Disease) Teena Irani, MD (Inactive) as Consulting Physician (Gastroenterology) [name], MD as Consulting Physician (Cardiology) Ilean China, RN as Case Manager    CHIEF COMPLAINTS/PURPOSE OF CONSULTATION:  F/u for IDA  HISTORY OF PRESENTING ILLNESS:   Kimberly Obrien 60 y.o. female is here because of a referral from Dr. 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   The pt reports that she is doing well overall. She reports seeing a Hematologist first after having a PE on her right side, damaging the lower lobe in 1999. She reports having a second in 2003 with 3 small PE in her right upper lobe. She reports having been on estrogen containing birth control in 1999 which she was told likely led to her PE, and was smoking at the time. She reports taking a diet pill with hormones in 2003 when she had her second PE. She denies having genetic testing regarding these Pes. She reports taking Warfarin 6 months after the first PE. She reports having taken Coumadin from 2003 until 3 months ago when she stopped due to her GI bleeding.  She denies having had any other blood clots, through 4 pregnancies and a few surgeries.  She denies a FHx of blood clotting or bleeding problems. She reports being SOB when she is losing lots of blood. She reports having a blood transfusion on 05/07/17, and receiving two iron infusions in the last 4 months. She denies allergic reactions to IV iron, but does note stomach issues and drowsiness for a couple days following IV iron.   Dr. Oletta Lamas has been following her iron levels. She reports using an inhaler.  She reports that Dr. 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   Of note prior to the patient's visit, pt has had an Abdomen CT completed on 04/19/17 with results revealing Mild splenomegaly.  On 05/07/17 she had a colonoscopy with 4 polyps removed, and non-bleeding internal hemorrhoids and upper endoscopy (05/07/17) with Gastritis. Biopsied. Normal examined duodenum. Blood found in stool.  Most recent lab results (08/30/17) of CBC, CMP is as follows: all values are WNL except for Glucose at 128, Total bilirubin at 1.4, AST at 68, ALT at 64, MCH at 26.4, Plt at 136k, Neut % at 75.5%.   On review of systems, pt reports occasional SOB, blood in the stools, abdominal pains, and denies mouth ulcers, and any other symptoms.   On PMHx the pt reports IBS, Positive hemoccult card, RRR with murmur, abdominal pain, respiratory disease, migraines, vertigo, COPD, arthritis, DJD, abnormal heart rhythm, fatty liver, pulmonary embolism, pulmonary edema, tachycardia, Vit D deficiency, arthralgia, insomnia, fatigue, hyperlipidemia, anxiety, dysuria, and sleep apnea.  On Surgical Hx the pt reports back surgery x4 (1997, 2006, 2006, 2007), tubal ligation (1985), wrist surgery x2 (1995 x2), cholecystectomy (2001), colonoscopy x2 (2007, 2014), EGD x2 (2002, 2017), hysterectomy partial (2009), rt eye cataract (2013), and cardiac catheterization (2015). On Social Hx the pt reports quitting smoking in 04/18/12 after 30 years, no ETOH use, and being occupationally disabled.    INTERVAL HISTORY   Kimberly Obrien is here for management and evaluation of her iron deficiency anemia. The patient's last visit with [place] was on 04/07/2020. The  pt reports that she is doing well overall.  The pt reports that she has had black/bloody stools once a month. This depends on if she takes Tylenol or not, she believes. She just takes 1 Tylenol prn for headaches. She has no other concerns or symptoms. The pt denies any new  medication changes. The pt notes hat she was told she did not have Autoimmune Hepatitis by the liver specialist from [place]. She currently sees Dr. Minette Brine at [facility] for her GI bleeding.   The pt notes she had fevers and chills for 5-6 days following her last IV iron. She does not tolerate the OTC Iron due to her stomach issues.  Lab results today 10/05/2020 of CBC w/diff and CMP is as follows: all values are WNL except for Plt of 79K, Glucose of 133, Total protein of 6.3, Total Bilirubin of 2.2. 10/05/2020 Ferritin of 26. 10/05/2020 Iron of 84 w Sat Ratio of 20.  On review of systems, pt reports intermittent black/bloody stools, fatigue and denies abdominal pain, SOB, fevers, chills, leg swelling, back pain and any other symptoms.  MEDICAL HISTORY:  Past Medical History:  Diagnosis Date  . Anemia   . Anticoagulation monitoring by pharmacist   . Arthritis   . Asthma   . Blood transfusion without reported diagnosis   . Cataract   . Cirrhosis ([place])   . COPD (chronic obstructive pulmonary disease) ([place])   . Diabetes mellitus without complication ([place])   . Dyspnea    exertion  . Elevated hemoglobin A1c 03/01/2018  . Family history of Hodgkin's lymphoma   . Family history of leukemia   . Family history of lung cancer   . Family history of stomach cancer   . Family history of uterine cancer   . History of colon polyps   . History of pulmonary embolism 02/04/2013  . Hyperlipidemia   . NASH (nonalcoholic steatohepatitis)   . Neuromuscular disorder ([place])    back surgery  . PE (pulmonary embolism)   . Sleep apnea     SURGICAL HISTORY: Past Surgical History:  Procedure Laterality Date  . ABDOMINAL HYSTERECTOMY    . BREAST BIOPSY    . CATARACT EXTRACTION, BILATERAL Bilateral   . CHOLECYSTECTOMY    . COLONOSCOPY WITH PROPOFOL N/A 05/07/2017   Procedure: COLONOSCOPY WITH PROPOFOL;  Surgeon: Laurence Spates, MD;  Location: [facility];  Service: Endoscopy;  Laterality: N/A;  .  ESOPHAGOGASTRODUODENOSCOPY (EGD) WITH PROPOFOL N/A 05/07/2017   Procedure: ESOPHAGOGASTRODUODENOSCOPY (EGD) WITH PROPOFOL;  Surgeon: Laurence Spates, MD;  Location: [facility];  Service: Endoscopy;  Laterality: N/A;  . EYE SURGERY     Cataracts  . INCONTINENCE SURGERY  2009  . IR TRANSCATHETER BX  03/15/2018  . IR VENOGRAM HEPATIC W HEMODYNAMIC EVALUATION  03/15/2018  . LEFT AND RIGHT HEART CATHETERIZATION WITH CORONARY ANGIOGRAM N/A 05/13/2014   Procedure: LEFT AND RIGHT HEART CATHETERIZATION WITH CORONARY ANGIOGRAM;  Surgeon: [name], MD;  Location: [facility] CATH LAB;  Service: Cardiovascular;  Laterality: N/A;  . SPINE SURGERY     L5 S1 fusion  . TUBAL LIGATION    . WRIST SURGERY Right    Had surgery twice to shave bone for blood circulation improvement.    SOCIAL HISTORY: Social History   Socioeconomic History  . Marital status: Married    Spouse name: Shanon Brow  . Number of children: 4  . Years of education: GED  . Highest education level: GED or equivalent  Occupational History  . Occupation: Unemployed  Tobacco  Use  . Smoking status: Former Smoker    Packs/day: 2.00    Years: 50.00    Pack years: 100.00    Types: Cigarettes    Start date: 08/22/1977    Quit date: 04/21/2012    Years since quitting: 8.4  . Smokeless tobacco: Never Used  Vaping Use  . Vaping Use: Never used  Substance and Sexual Activity  . Alcohol use: No  . Drug use: No  . Sexual activity: Not Currently    Birth control/protection: Surgical  Other Topics Concern  . Not on file  Social History Narrative  . Not on file   Social Determinants of Health   Financial Resource Strain: Low Risk   . Difficulty of Paying Living Expenses: Not hard at all  Food Insecurity: No Food Insecurity  . Worried About Charity fundraiser in the Last Year: Never true  . Ran Out of Food in the Last Year: Never true  Transportation Needs: No Transportation Needs  . Lack of Transportation (Medical): No  . Lack of  Transportation (Non-Medical): No  Physical Activity: Inactive  . Days of Exercise per Week: 0 days  . Minutes of Exercise per Session: 0 min  Stress: Stress Concern Present  . Feeling of Stress : To some extent  Social Connections: Moderately Isolated  . Frequency of Communication with Friends and Family: More than three times a week  . Frequency of Social Gatherings with Friends and Family: More than three times a week  . Attends Religious Services: Never  . Active Member of Clubs or Organizations: No  . Attends Archivist Meetings: Never  . Marital Status: Married  Human resources officer Violence: Not At Risk  . Fear of Current or Ex-Partner: No  . Emotionally Abused: No  . Physically Abused: No  . Sexually Abused: No    FAMILY HISTORY: Family History  Problem Relation Age of Onset  . Cirrhosis Mother        NASH  . Irritable bowel syndrome Mother   . Hyperlipidemia Father   . Heart failure Father   . Bipolar disorder Father   . Heart disease Father   . Obesity Daughter   . Endometrial cancer Daughter 64  . Hypertension Brother   . Colon polyps Sister        unknown number but fewer than patient  . Irritable bowel syndrome Sister   . Heart failure Paternal Grandfather   . Stomach cancer Maternal Uncle        dx 40s/50s  . Cancer Paternal Aunt        NOS  . Cancer Paternal Uncle        NOS  . Lung cancer Maternal Uncle        dx 40s/50s, smoker  . Cancer Maternal Uncle        NOS  . Cancer Paternal Aunt        NOS  . Leukemia Cousin        dx 109s, died 2 weeks after diagnosis (maternal first cousin)  . Cancer Cousin        NOS (paternal first cousin)  . Hodgkin's lymphoma Other 14       4th degree relative (mother's half-sibling's son)  . Heart attack Neg Hx   . Miscarriages / Stillbirths Neg Hx   . Colon cancer Neg Hx   . Esophageal cancer Neg Hx   . Rectal cancer Neg Hx     ALLERGIES:  is allergic to aciphex [  rabeprazole sodium], fluconazole,  lansoprazole, sucralfate, vancomycin, claritin [loratadine], crestor [rosuvastatin], doxycycline, ibuprofen, lipitor [atorvastatin], macrobid [nitrofurantoin macrocrystal], metoprolol, metronidazole, nsaids, omeprazole, rabeprazole, and septra [sulfamethoxazole-trimethoprim].  MEDICATIONS:  Current Outpatient Medications  Medication Sig Dispense Refill  . acetaminophen (TYLENOL) 500 MG tablet Take 500 mg by mouth every 6 (six) hours as needed.    Marland Kitchen albuterol (VENTOLIN HFA) 108 (90 Base) MCG/ACT inhaler Inhale 2 puffs into the lungs every 6 (six) hours as needed for wheezing or shortness of breath. 18 g 0  . Blood Glucose Monitoring Suppl (ONETOUCH VERIO REFLECT) w/Device KIT Test BS twice daily Dx e11.9 1 kit 0  . fluticasone (FLONASE) 50 MCG/ACT nasal spray Place 2 sprays into both nostrils daily. 16 g 11  . furosemide (LASIX) 20 MG tablet Take 1 tablet (20 mg total) by mouth as needed. 30 tablet 3  . ipratropium-albuterol (DUONEB) 0.5-2.5 (3) MG/3ML SOLN Take 3 mLs by nebulization every 6 (six) hours as needed. 360 mL 1  . Lancets (ONETOUCH DELICA PLUS [id number]) MISC CHECK BLOOD SUGAR TWICE  DAILY AND AS NEEDED Dx E11.9 200 each 3  . metFORMIN (GLUCOPHAGE-XR) 750 MG 24 hr tablet One twice daily with food 180 tablet 1  . ONETOUCH VERIO test strip TEST BLOOD SUGAR TWICE  DAILY 200 strip 3  . traZODone (DESYREL) 150 MG tablet Use from 1/3 to 1 tablet nightly as needed for sleep. 90 tablet 1  . triamcinolone cream (KENALOG) 0.1 % Apply 1 application topically 3 (three) times daily. Avoid face and genitalia 45 g 0   No current facility-administered medications for this visit.    REVIEW OF SYSTEMS:  10 Point review of Systems was done is negative except as noted above.  PHYSICAL EXAMINATION:  Vitals:   10/05/20 1050  BP: (!) 128/50  Pulse: 74  Resp: 18  Temp: (!) 97.2 F (36.2 C)  SpO2: 94%   Filed Weights   10/05/20 1050  Weight: 206 lb 11.2 oz (93.8 kg)     Exam was given in a  chair.   GENERAL:alert, in no acute distress and comfortable SKIN: no acute rashes, no significant lesions EYES: conjunctiva are pink and non-injected, sclera anicteric OROPHARYNX: MMM, no exudates, no oropharyngeal erythema or ulceration NECK: supple, no JVD LYMPH:  no palpable lymphadenopathy in the cervical, axillary or inguinal regions LUNGS: clear to auscultation b/l with normal respiratory effort HEART: regular rate & rhythm ABDOMEN:  normoactive bowel sounds , non tender, not distended. Extremity: no pedal edema PSYCH: alert & oriented x 3 with fluent speech NEURO: no focal motor/sensory deficits  LABORATORY DATA:  I have reviewed the data as listed Recent Results (from the past 2160 hour(s))  SARS Coronavirus 2 (TAT 6-24 hrs)     Status: None   Collection Time: 07/27/20 12:00 AM  Result Value Ref Range   SARS Coronavirus 2 RESULT: NEGATIVE     Comment: RESULT: NEGATIVESARS-CoV-2 INTERPRETATION:A NEGATIVE  test result means that SARS-CoV-2 RNA was not present in the specimen above the limit of detection of this test. This does not preclude a possible SARS-CoV-2 infection and should not be used as the  sole basis for patient management decisions. Negative results must be combined with clinical observations, patient history, and epidemiological information. Optimum specimen types and timing for peak viral levels during infections caused by SARS-CoV-2  have not been determined. Collection of multiple specimens or types of specimens may be necessary to detect virus. Improper specimen collection and handling, sequence variability under primers/probes, or organism  present below the limit of detection may  lead to false negative results. Positive and negative predictive values of testing are highly dependent on prevalence. False negative test results are more likely when prevalence of disease is high.The expected result is NEGATIVE.Fact S heet for  Healthcare Providers:  LocalChronicle.no Sheet for Patients: SalonLookup.es Reference Range - Negative   Bayer DCA Hb A1c Waived     Status: None   Collection Time: 08/18/20 12:06 PM  Result Value Ref Range   HB A1C (BAYER DCA - WAIVED) 5.1 <7.0 %    Comment:                                       Diabetic Adult            <7.0                                       Healthy Adult        4.3 - 5.7                                                           (DCCT/NGSP) American Diabetes Association's Summary of Glycemic Recommendations for Adults with Diabetes: Hemoglobin A1c <7.0%. More stringent glycemic goals (A1c <6.0%) may further reduce complications at the cost of increased risk of hypoglycemia.   CBC with Differential/Platelet     Status: Abnormal   Collection Time: 08/18/20 12:10 PM  Result Value Ref Range   WBC 4.9 3.4 - 10.8 x10E3/uL   RBC 4.86 3.77 - 5.28 x10E6/uL   Hemoglobin 14.0 11.1 - 15.9 g/dL   Hematocrit 42.2 34.0 - 46.6 %   MCV 87 79 - 97 fL   MCH 28.8 26.6 - 33.0 pg   MCHC 33.2 31.5 - 35.7 g/dL   RDW 13.8 11.7 - 15.4 %   Platelets 97 (LL) 150 - 450 x10E3/uL    Comment: Actual platelet count may be somewhat higher than reported due to aggregation of platelets in this sample.    Neutrophils 76 Not Estab. %   Lymphs 18 Not Estab. %   Monocytes 5 Not Estab. %   Eos 1 Not Estab. %   Basos 0 Not Estab. %   Neutrophils Absolute 3.7 1.4 - 7.0 x10E3/uL   Lymphocytes Absolute 0.9 0.7 - 3.1 x10E3/uL   Monocytes Absolute 0.3 0.1 - 0.9 x10E3/uL   EOS (ABSOLUTE) 0.1 0.0 - 0.4 x10E3/uL   Basophils Absolute 0.0 0.0 - 0.2 x10E3/uL   Immature Granulocytes 0 Not Estab. %   Immature Grans (Abs) 0.0 0.0 - 0.1 x10E3/uL   Hematology Comments: Note:     Comment: Verified by microscopic examination.  CMP14+EGFR     Status: Abnormal   Collection Time: 08/18/20 12:10 PM  Result Value Ref Range   Glucose 240 (H) 65 - 99 mg/dL   BUN 9 6 - 24  mg/dL   Creatinine, Ser 0.57 0.57 - 1.00 mg/dL   GFR calc non Af Amer 102 >59 mL/min/1.73   GFR calc Af Amer 117 >59 mL/min/1.73    Comment: **In accordance with recommendations from the NKF-ASN Task force,**  [facility] is in the process of updating its eGFR calculation to the   2021 CKD-EPI creatinine equation that estimates kidney function   without a race variable.    BUN/Creatinine Ratio 16 9 - 23   Sodium 141 134 - 144 mmol/L   Potassium 3.9 3.5 - 5.2 mmol/L   Chloride 107 (H) 96 - 106 mmol/L   CO2 23 20 - 29 mmol/L   Calcium 8.8 8.7 - 10.2 mg/dL   Total Protein 5.9 (L) 6.0 - 8.5 g/dL   Albumin 3.7 (L) 3.8 - 4.9 g/dL   Globulin, Total 2.2 1.5 - 4.5 g/dL   Albumin/Globulin Ratio 1.7 1.2 - 2.2   Bilirubin Total 1.9 (H) 0.0 - 1.2 mg/dL   Alkaline Phosphatase 86 44 - 121 IU/L    Comment:               **Please note reference interval change**   AST 36 0 - 40 IU/L   ALT 28 0 - 32 IU/L  Lipid panel     Status: None   Collection Time: 08/18/20 12:10 PM  Result Value Ref Range   Cholesterol, Total 145 100 - 199 mg/dL   Triglycerides 122 0 - 149 mg/dL   HDL 59 >39 mg/dL   VLDL Cholesterol Cal 21 5 - 40 mg/dL   LDL Chol Calc (NIH) 65 0 - 99 mg/dL   Chol/HDL Ratio 2.5 0.0 - 4.4 ratio    Comment:                                   T. Chol/HDL Ratio                                             Men  Women                               1/2 Avg.Risk  3.4    3.3                                   Avg.Risk  5.0    4.4                                2X Avg.Risk  9.6    7.1                                3X Avg.Risk 23.4   11.0   Microalbumin / creatinine urine ratio     Status: None   Collection Time: 08/18/20  3:57 PM  Result Value Ref Range   Creatinine, Urine 159.0 Not Estab. mg/dL   Microalbumin, Urine 15.4 Not Estab. ug/mL   Microalb/Creat Ratio 10 0 - 29 mg/g creat    Comment:                        Normal:                0 -  29  Moderately increased: 30 -  300                        Severely increased:       >300   Sample to Blood Bank     Status: None   Collection Time: 10/05/20  9:33 AM  Result Value Ref Range   Blood Bank Specimen SAMPLE AVAILABLE FOR TESTING    Sample Expiration      10/08/2020,2359 Performed at [facility], [place] [address] [place], [place] [address]   Ferritin     Status: None   Collection Time: 10/05/20  9:34 AM  Result Value Ref Range   Ferritin 26 11 - 307 ng/mL    Comment: Performed at [facility], [place] [address] [place], [place] [address]  Iron and TIBC     Status: Abnormal   Collection Time: 10/05/20  9:34 AM  Result Value Ref Range   Iron 84 41 - 142 ug/dL   TIBC 424 236 - 444 ug/dL   Saturation Ratios 20 (L) 21 - 57 %   UIBC 340 120 - 384 ug/dL    Comment: Performed at [facility], [place] [address], [place], [place] [address]  CMP ([place] only)     Status: Abnormal   Collection Time: 10/05/20  9:34 AM  Result Value Ref Range   Sodium 143 135 - 145 mmol/L   Potassium 3.8 3.5 - 5.1 mmol/L   Chloride 109 98 - 111 mmol/L   CO2 25 22 - 32 mmol/L   Glucose, Bld 133 (H) 70 - 99 mg/dL    Comment: Glucose reference range applies only to samples taken after fasting for at least 8 hours.   BUN 9 6 - 20 mg/dL   Creatinine 0.65 0.44 - 1.00 mg/dL   Calcium 9.0 8.9 - 10.3 mg/dL   Total Protein 6.3 (L) 6.5 - 8.1 g/dL   Albumin 3.7 3.5 - 5.0 g/dL   AST 29 15 - 41 U/L   ALT 24 0 - 44 U/L   Alkaline Phosphatase 79 38 - 126 U/L   Total Bilirubin 2.2 (H) 0.3 - 1.2 mg/dL   GFR, Estimated >60 >60 mL/min    Comment: (NOTE) Calculated using the CKD-EPI Creatinine Equation (2021)    Anion gap 9 5 - 15    Comment: Performed at [facility], [place] [address], [place], [place] [address]  CBC with Differential ([place] Only)     Status: Abnormal   Collection Time: 10/05/20  9:34 AM  Result Value Ref Range   WBC  Count 5.0 4.0 - 10.5 K/uL   RBC 4.78 3.87 - 5.11 MIL/uL   Hemoglobin 13.8 12.0 - 15.0 g/dL   HCT 41.0 36.0 - 46.0 %   MCV 85.8 80.0 - 100.0 fL   MCH 28.9 26.0 - 34.0 pg   MCHC 33.7 30.0 - 36.0 g/dL   RDW 14.1 11.5 - 15.5 %   Platelet Count 79 (L) 150 - 400 K/uL   nRBC 0.0 0.0 - 0.2 %   Neutrophils Relative % 70 %   Neutro Abs 3.5 1.7 - 7.7 K/uL   Lymphocytes Relative 22 %   Lymphs Abs 1.1 0.7 - 4.0 K/uL   Monocytes Relative 6 %   Monocytes Absolute 0.3 0.1 - 1.0 K/uL   Eosinophils Relative 1 %   Eosinophils Absolute 0.1 0.0 - 0.5 K/uL  Basophils Relative 1 %   Basophils Absolute 0.0 0.0 - 0.1 K/uL   Immature Granulocytes 0 %   Abs Immature Granulocytes 0.01 0.00 - 0.07 K/uL    Comment: Performed at [facility], [place] [address] [place], [place] [address]   . Lab Results  Component Value Date   IRON 84 10/05/2020   TIBC 424 10/05/2020   IRONPCTSAT 20 (L) 10/05/2020   (Iron and TIBC)  Lab Results  Component Value Date   FERRITIN 26 10/05/2020   . CBC Latest Ref Rng & Units 10/05/2020 08/18/2020 06/17/2020  WBC 4.0 - 10.5 K/uL 5.0 4.9 5.6  Hemoglobin 12.0 - 15.0 g/dL 13.8 14.0 14.6  Hematocrit 36.0 - 46.0 % 41.0 42.2 42.0  Platelets 150 - 400 K/uL 79(L) 97(LL) 97.0(L)   . CMP Latest Ref Rng & Units 10/05/2020 08/18/2020 06/17/2020  Glucose 70 - 99 mg/dL 133(H) 240(H) -  BUN 6 - 20 mg/dL 9 9 -  Creatinine 0.44 - 1.00 mg/dL 0.65 0.57 -  Sodium 135 - 145 mmol/L 143 141 -  Potassium 3.5 - 5.1 mmol/L 3.8 3.9 -  Chloride 98 - 111 mmol/L 109 107(H) -  CO2 22 - 32 mmol/L 25 23 -  Calcium 8.9 - 10.3 mg/dL 9.0 8.8 -  Total Protein 6.5 - 8.1 g/dL 6.3(L) 5.9(L) 6.3  Total Bilirubin 0.3 - 1.2 mg/dL 2.2(H) 1.9(H) 2.8(H)  Alkaline Phos 38 - 126 U/L 79 86 69  AST 15 - 41 U/L 29 36 37  ALT 0 - 44 U/L 24 28 31       Diagnosis 05/07/17 1. Stomach, biopsy, antrum - REACTIVE GASTROPATHY. - THERE IS NO EVIDENCE OF HELICOBACTER PYLORI, DYSPLASIA OR  MALIGNANCY. - SEE COMMENT. 2. Colon, polyp(s), cecal x 2 - TUBULAR ADENOMA(S). - HIGH GRADE DYSPLASIA IS NOT IDENTIFIED. 3. Colon, polyp(s), descending x 2 - TUBULAR ADENOMA(S). - HIGH GRADE DYSPLASIA IS NOT IDENTIFIED. Microscopic Comment 1. This pattern can be associated with non-steroidal anti-inflammatory drugs (NSAIDS), alcohol or with other causes of chemical gastropathy. Helicobacter pylori are not identified with Warthin-Starry stain.   ASSESSMENT & PLAN:   60 y.o. female with   1. History of recurrent Pulmonary Embolisms Her previous events appears to have been triggered by taking estrogen containing hormones and smoking. No overt Fhx of thrombophilias and no previous VTE with several pregnancies and surgeries - suggest against significant hereditary thrombophilia. Factor V Leiden was negative, Prothrombin gene mutation neg, APLA Ab panel - unrevealing.  PLAN:  -patient continues to have issues with GI bleeding and is off blood thinners as a result -no clear justification for restarting anticoagulation till her GI bleeding is completely control and then she could be considered for prophylactic ASA.  2. GI bleeding -recent w/u done -05/07/17 Upper Endoscopy and Colonoscopy show gastritis with 4 large colon polyps which were removed which were found to be tubular adenomas.    3. Autoimmune hepatitis 04/08/18 US Abdomen revealed Hepatic steatosis without other significant abnormality. -continue f/u with Dr Oletta Lamas for further evaluation and mx of GI bleeding.  4. Iron deficiency Anemia related to GI bleeding. Has needed previous IV Iron and PRBC transfusions. . 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 Plt stable. Not anemic. -No signs of active inflammation in the  liver at  this time due to AST and ALT levels.  -Advised pt that Autoimmune Thrombocytopenia and liver cirrhosis are primary drivers for low Plt counts. Can be caused in part by body using Plt to stop bleeding.  -Advised pt that anything that stimulates her immune system will cause Plt to decrease further-- allergies, infections, etc. -Advised pt her iron levels have gradually decreased. Her last IV was February 2021.  -Discussed trying liquid iron supplement. The pt is agreeable to try this. Will start 1/4 tsp and work up to 1 tsp daily. -Would consider treatment thrombocytopenia if her PLT <50K. No need at this time. -Will see back in 6 months with labs. -Rx Iron Polysaccharide (liquid).    FOLLOW UP: RTC with Dr Irene Limbo with labs in 6 months   The total time spent in the appointment was 25 minutes and more than 50% was on counseling and direct patient cares.  All of the patient's questions were answered with apparent satisfaction. The patient knows to call the clinic with any problems, questions or concerns.   Sullivan Lone MD [email or web address] [facility] Hematology/Oncology Physician [facility]  (Office):       [phone] (Work cell):  [phone] (Fax):           [phone]  I, Reinaldo Raddle, am acting as scribe for Dr. Sullivan Lone, MD.     .I have reviewed the above documentation for accuracy and completeness, and I agree with the above. Brunetta Genera MD

## 2020-10-05 ENCOUNTER — Other Ambulatory Visit: Payer: Self-pay

## 2020-10-05 ENCOUNTER — Inpatient Hospital Stay: Payer: Medicare Other | Attending: Hematology | Admitting: Hematology

## 2020-10-05 ENCOUNTER — Inpatient Hospital Stay: Payer: Medicare Other

## 2020-10-05 VITALS — BP 128/50 | HR 74 | Temp 97.2°F | Resp 18 | Ht 66.0 in | Wt 206.7 lb

## 2020-10-05 DIAGNOSIS — Z801 Family history of malignant neoplasm of trachea, bronchus and lung: Secondary | ICD-10-CM | POA: Insufficient documentation

## 2020-10-05 DIAGNOSIS — Z86711 Personal history of pulmonary embolism: Secondary | ICD-10-CM | POA: Insufficient documentation

## 2020-10-05 DIAGNOSIS — D500 Iron deficiency anemia secondary to blood loss (chronic): Secondary | ICD-10-CM

## 2020-10-05 DIAGNOSIS — D696 Thrombocytopenia, unspecified: Secondary | ICD-10-CM | POA: Diagnosis not present

## 2020-10-05 DIAGNOSIS — Z79899 Other long term (current) drug therapy: Secondary | ICD-10-CM | POA: Insufficient documentation

## 2020-10-05 DIAGNOSIS — Z8049 Family history of malignant neoplasm of other genital organs: Secondary | ICD-10-CM | POA: Insufficient documentation

## 2020-10-05 DIAGNOSIS — D509 Iron deficiency anemia, unspecified: Secondary | ICD-10-CM | POA: Diagnosis not present

## 2020-10-05 DIAGNOSIS — Z86010 Personal history of colonic polyps: Secondary | ICD-10-CM | POA: Diagnosis not present

## 2020-10-05 DIAGNOSIS — Z800 Family history of malignant neoplasm of digestive organs: Secondary | ICD-10-CM | POA: Diagnosis not present

## 2020-10-05 DIAGNOSIS — Z806 Family history of leukemia: Secondary | ICD-10-CM | POA: Insufficient documentation

## 2020-10-05 DIAGNOSIS — Z7984 Long term (current) use of oral hypoglycemic drugs: Secondary | ICD-10-CM | POA: Diagnosis not present

## 2020-10-05 LAB — CBC WITH DIFFERENTIAL (CANCER CENTER ONLY)
Abs Immature Granulocytes: 0.01 10*3/uL (ref 0.00–0.07)
Basophils Absolute: 0 10*3/uL (ref 0.0–0.1)
Basophils Relative: 1 %
Eosinophils Absolute: 0.1 10*3/uL (ref 0.0–0.5)
Eosinophils Relative: 1 %
HCT: 41 % (ref 36.0–46.0)
Hemoglobin: 13.8 g/dL (ref 12.0–15.0)
Immature Granulocytes: 0 %
Lymphocytes Relative: 22 %
Lymphs Abs: 1.1 10*3/uL (ref 0.7–4.0)
MCH: 28.9 pg (ref 26.0–34.0)
MCHC: 33.7 g/dL (ref 30.0–36.0)
MCV: 85.8 fL (ref 80.0–100.0)
Monocytes Absolute: 0.3 10*3/uL (ref 0.1–1.0)
Monocytes Relative: 6 %
Neutro Abs: 3.5 10*3/uL (ref 1.7–7.7)
Neutrophils Relative %: 70 %
Platelet Count: 79 10*3/uL — ABNORMAL LOW (ref 150–400)
RBC: 4.78 MIL/uL (ref 3.87–5.11)
RDW: 14.1 % (ref 11.5–15.5)
WBC Count: 5 10*3/uL (ref 4.0–10.5)
nRBC: 0 % (ref 0.0–0.2)

## 2020-10-05 LAB — IRON AND TIBC
Iron: 84 ug/dL (ref 41–142)
Saturation Ratios: 20 % — ABNORMAL LOW (ref 21–57)
TIBC: 424 ug/dL (ref 236–444)
UIBC: 340 ug/dL (ref 120–384)

## 2020-10-05 LAB — SAMPLE TO BLOOD BANK

## 2020-10-05 LAB — CMP (CANCER CENTER ONLY)
ALT: 24 U/L (ref 0–44)
AST: 29 U/L (ref 15–41)
Albumin: 3.7 g/dL (ref 3.5–5.0)
Alkaline Phosphatase: 79 U/L (ref 38–126)
Anion gap: 9 (ref 5–15)
BUN: 9 mg/dL (ref 6–20)
CO2: 25 mmol/L (ref 22–32)
Calcium: 9 mg/dL (ref 8.9–10.3)
Chloride: 109 mmol/L (ref 98–111)
Creatinine: 0.65 mg/dL (ref 0.44–1.00)
GFR, Estimated: >60 mL/min (ref >60)
Glucose, Bld: 133 mg/dL — ABNORMAL HIGH (ref 70–99)
Potassium: 3.8 mmol/L (ref 3.5–5.1)
Sodium: 143 mmol/L (ref 135–145)
Total Bilirubin: 2.2 mg/dL — ABNORMAL HIGH (ref 0.3–1.2)
Total Protein: 6.3 g/dL — ABNORMAL LOW (ref 6.5–8.1)

## 2020-10-05 LAB — FERRITIN: Ferritin: 26 ng/mL (ref 11–307)

## 2020-10-05 MED ORDER — POLYSACCHARIDE IRON COMPLEX 125 MG/5ML PO LIQD: 5.0000 mL | Freq: Every day | ORAL | 2 refills | Status: DC

## 2020-10-06 DIAGNOSIS — R3915 Urgency of urination: Secondary | ICD-10-CM | POA: Diagnosis not present

## 2020-10-06 DIAGNOSIS — N816 Rectocele: Secondary | ICD-10-CM | POA: Diagnosis not present

## 2020-10-06 DIAGNOSIS — M549 Dorsalgia, unspecified: Secondary | ICD-10-CM | POA: Diagnosis not present

## 2020-10-06 DIAGNOSIS — Z86711 Personal history of pulmonary embolism: Secondary | ICD-10-CM | POA: Diagnosis not present

## 2020-10-06 DIAGNOSIS — R350 Frequency of micturition: Secondary | ICD-10-CM | POA: Diagnosis not present

## 2020-10-06 DIAGNOSIS — D689 Coagulation defect, unspecified: Secondary | ICD-10-CM | POA: Diagnosis not present

## 2020-10-06 DIAGNOSIS — N390 Urinary tract infection, site not specified: Secondary | ICD-10-CM | POA: Diagnosis not present

## 2020-10-06 DIAGNOSIS — G8929 Other chronic pain: Secondary | ICD-10-CM | POA: Diagnosis not present

## 2020-10-22 ENCOUNTER — Encounter: Payer: Self-pay | Admitting: Genetic Counselor

## 2020-10-22 ENCOUNTER — Ambulatory Visit: Payer: Self-pay | Admitting: Genetic Counselor

## 2020-10-22 ENCOUNTER — Telehealth: Payer: Self-pay | Admitting: Genetic Counselor

## 2020-10-22 DIAGNOSIS — Z1379 Encounter for other screening for genetic and chromosomal anomalies: Secondary | ICD-10-CM | POA: Insufficient documentation

## 2020-10-22 NOTE — Telephone Encounter (Signed)
Revealed negative genetic testing. Discussed that we do not know why she has had multiple colon polyps or why there is cancer in the family. There could be a genetic mutation in the family that Kimberly Obrien did not inherit. There could also be a mutation in a different gene that we are not testing, or our current technology may not be able to detect certain mutations. It will therefore be important for her to stay in contact with genetics to keep up with whether additional testing may be appropriate in the future.

## 2020-10-22 NOTE — Progress Notes (Signed)
HPI:  Kimberly Obrien was previously seen in the [place] clinic due to a personal history of colon polyps and concerns regarding a hereditary predisposition to cancer. Please refer to our prior cancer genetics clinic note for more information regarding our discussion, assessment and recommendations, at the time. Kimberly Obrien recent genetic test results were disclosed to her, as were recommendations warranted by these results. These results and recommendations are discussed in more detail below.  FAMILY HISTORY:  We obtained a detailed, 4-generation family history.  Significant diagnoses are listed below: Family History  Problem Relation Age of Onset  . Cirrhosis Mother        NASH  . Irritable bowel syndrome Mother   . Hyperlipidemia Father   . Heart failure Father   . Bipolar disorder Father   . Heart disease Father   . Obesity Daughter   . Endometrial cancer Daughter 29  . Hypertension Brother   . Colon polyps Sister        unknown number but fewer than patient  . Irritable bowel syndrome Sister   . Heart failure Paternal Grandfather   . Stomach cancer Maternal Uncle        dx 40s/50s  . Cancer Paternal Aunt        NOS  . Cancer Paternal Uncle        NOS  . Lung cancer Maternal Uncle        dx 40s/50s, smoker  . Cancer Maternal Uncle        NOS  . Cancer Paternal Aunt        NOS  . Leukemia Cousin        dx 40s, died 2 weeks after diagnosis (maternal first cousin)  . Cancer Cousin        NOS (paternal first cousin)  . Hodgkin's lymphoma Other 14       4th degree relative (mother's half-sibling's son)  . Heart attack Neg Hx   . Miscarriages / Stillbirths Neg Hx   . Colon cancer Neg Hx   . Esophageal cancer Neg Hx   . Rectal cancer Neg Hx     Kimberly Obrien has one daughter (age 12) and three sons (ages 41-44). Her daughter has a history of endometrial cancer diagnosed when she was 78. Kimberly Obrien has one sister and had two brothers. Her sister has had  colon polyps (unknown number).  Kimberly Obrien mother died at age 37 and did not have cancer, although she did have cirrhosis of the liver. Kimberly Obrien mother had three full-brothers, two full-sisters, one paternal half-sister, and three paternal half-brothers. Three of the full-brothers had cancer - one had stomach cancer diagnosed in his 65s or 66s, another had lung cancer diagnosed in his 22s or 74s and was a smoker, and the third had an unknown type of cancer. One first cousin died from leukemia in his 67s, approximately 2 weeks after diagnosis. She also notes that one of her mother's half-siblings had a son with Hodgkin's lymphoma at age 48. Kimberly Obrien maternal grandparents died older than 15 without cancer.  Kimberly Obrien's father died at age 15 and did not have cancer. She had ten paternal aunts/uncles total. Two paternal aunts had cancer and one paternal uncle had cancer, although Kimberly Obrien does not know what type of cancer they had or their ages of diagnosis. One paternal first cousin died from an unknown cancer in his late 64s. Her paternal grandmother died in her 41s from old age,  and her paternal grandfather died age 42 from heart failure.  Kimberly Obrien does not know of family members with colon polyps other than her sister.  Kimberly Obrien is unaware of previous family history of genetic testing for hereditary cancer risks. Patient's maternal ancestors are of possibly [place] descent, and paternal ancestors are of [place] descent. There is no reported Ashkenazi Jewish ancestry. There is no known consanguinity.  GENETIC TEST RESULTS: Genetic testing reported out on 10/21/2020 through the [facility] CustomNext-Cancer + RNAinsight panel. No pathogenic variants were detected.   The CustomNext-Cancer+RNAinsight panel offered by [facility] includes sequencing and rearrangement analysis for the following 91 genes: AIP, ALK, APC, ATM, AXIN2, BAP1, BARD1, BLM, BMPR1A, BRCA1, BRCA2, BRIP1,  CDC73, CDH1, CDK4, CDKN1B, CDKN2A, CHEK2, CTNNA1, DICER1, FANCC, FH, FLCN, GALNT12, KIF1B, LZTR1, MAX, MEN1, MET, MLH1, MRE11A, MSH2, MSH3, MSH6, MUTYH, NBN, NF1, NF2, NTHL1, PALB2, PHOX2B, PMS2, POT1, PRKAR1A, PTCH1, PTEN, RAD50, RAD51C, RAD51D, RB1, RECQL, RET, SDHA, SDHAF2, SDHB, SDHC, SDHD, SMAD4, SMARCA4, SMARCB1, SMARCE1, STK11, SUFU, TMEM127, Tp53, TSC1, TSC2, VHL and XRCC2 (sequencing and deletion/duplication); CASR, CFTR, CPA1, CTRC, EGFR, EGLN1, FAM175A, HOXB13, KIT, MITF, MLH3, PALLD, PDGFRA, POLD1, POLE, PRSS1, RINT1, RPS20, SPINK1 and TERT (sequencing only); EPCAM and GREM1 (deletion/duplication only). RNA data is routinely analyzed for use in variant interpretation for all genes. The test report will be scanned into EPIC and located under the Molecular Pathology section of the Results Review tab.  A portion of the result report is included below for reference.      CANCER SCREENING RECOMMENDATIONS: Kimberly Obrien test result is considered negative (normal).  This means that we have not identified a hereditary cause for her personal history of colon polyps or family history of cancer at this time. While reassuring, this does not definitively rule out a hereditary predisposition to cancer. It is still possible that there could be genetic mutations that are undetectable by current technology. There could be genetic mutations in genes that have not been tested or identified to increase cancer risk.  Therefore, it is recommended she continue to follow the cancer management and screening guidelines provided by her GI and primary healthcare provider.   An individual's cancer risk and medical management are not determined by genetic test results alone. Overall cancer risk assessment incorporates additional factors, including personal medical history, family history, and any available genetic information that may result in a personalized plan for cancer prevention and surveillance.  This negative  genetic test simply tells [place] that we cannot yet define why Kimberly Obrien has had an increased number of colorectal polyps. Kimberly Obrien medical management and screening should be based on the prospect that she will likely form more colon polyps in the future and should, therefore, undergo more frequent colonoscopy screening at intervals determined by her GI providers. According to NCCN Guidelines (Genetic/Familial High-Risk Assessment: Colorectal Version 1.2021), individuals who have had more than 20 but less than 100 adenomas and normal genetic testing should have a colonoscopy and polypectomy every 1-2 years. These individuals may also consider a baseline upper endoscopy (including complete visualization of the ampulla of Vater) and repeat as indicated.  Based on Kimberly Obrien's personal and family history, as well as her genetic test results, a statistical model Midwife) was used to estimate her risk of developing breast cancer. Tyrer-Cuzick estimates her lifetime risk of developing breast cancer to be approximately 5.2%. This lifetime breast cancer risk is a preliminary estimate based on available information using one of several models endorsed by the American  Cancer Society (ACS). The ACS recommends consideration of breast MRI screening as an adjunct to mammography for patients at high risk (defined as 20% or greater lifetime risk). A more detailed breast cancer risk assessment can be considered, if clinically indicated.       RECOMMENDATIONS FOR FAMILY MEMBERS:  Individuals in this family might be at some increased risk of developing cancer, over the general population risk, simply due to the family history of cancer.  We recommended women in this family have a yearly mammogram beginning at age 8, or 2 years younger than the earliest onset of cancer, an annual clinical breast exam, and perform monthly breast self-exams. Women in this family should also have a gynecological exam as recommended  by their primary provider.  According to NCCN Guidelines (Genetic/Familial High-Risk Assessment: Colorectal Version 1.2021), all first-degree relatives should have colonoscopies beginning in their late teens, then every 2 years. Initial initiation age and frequency of colonoscopy may be modified based on clinical judgment.   It is also possible there is a hereditary cause for the cancer in Kimberly Obrien's family that she did not inherit and therefore was not identified in her.  Based on Kimberly Obrien family history, we recommended her daughter, who was diagnosed with endometrial at age 57, have genetic counseling and testing. Kimberly Obrien will let us know if we can be of any assistance in coordinating genetic counseling and/or testing for this family member.   FOLLOW-UP: Lastly, we discussed with Kimberly Obrien that cancer genetics is a rapidly advancing field and it is possible that new genetic tests will be appropriate for her and/or her family members in the future. We encouraged her to remain in contact with cancer genetics on an annual basis so we can update her personal and family histories and let her know of advances in cancer genetics that may benefit this family.   Our contact number was provided. Kimberly Obrien questions were answered to her satisfaction, and she knows she is welcome to call us at anytime with additional questions or concerns.   Clint Guy, MS, [facility] Genetic Counselor [email or web address] Phone: [phone]

## 2020-10-26 DIAGNOSIS — N320 Bladder-neck obstruction: Secondary | ICD-10-CM | POA: Diagnosis not present

## 2020-10-26 DIAGNOSIS — N3030 Trigonitis without hematuria: Secondary | ICD-10-CM | POA: Diagnosis not present

## 2020-11-04 ENCOUNTER — Other Ambulatory Visit: Payer: Self-pay

## 2020-11-04 DIAGNOSIS — K760 Fatty (change of) liver, not elsewhere classified: Secondary | ICD-10-CM

## 2020-11-04 DIAGNOSIS — K7460 Unspecified cirrhosis of liver: Secondary | ICD-10-CM

## 2020-11-16 ENCOUNTER — Other Ambulatory Visit: Payer: Self-pay

## 2020-11-16 ENCOUNTER — Ambulatory Visit (HOSPITAL_COMMUNITY)
Admission: RE | Admit: 2020-11-16 | Discharge: 2020-11-16 | Disposition: A | Discharge status: Home / Self Care | Payer: Medicare Other | Source: Ambulatory Visit | Attending: Gastroenterology | Admitting: Gastroenterology

## 2020-11-16 DIAGNOSIS — K7460 Unspecified cirrhosis of liver: Secondary | ICD-10-CM | POA: Diagnosis not present

## 2020-11-16 DIAGNOSIS — K760 Fatty (change of) liver, not elsewhere classified: Secondary | ICD-10-CM | POA: Insufficient documentation

## 2020-11-17 ENCOUNTER — Ambulatory Visit (INDEPENDENT_AMBULATORY_CARE_PROVIDER_SITE_OTHER): Payer: Medicare Other | Admitting: Family Medicine

## 2020-11-17 ENCOUNTER — Ambulatory Visit (INDEPENDENT_AMBULATORY_CARE_PROVIDER_SITE_OTHER): Payer: Medicare Other

## 2020-11-17 ENCOUNTER — Encounter: Payer: Self-pay | Admitting: Family Medicine

## 2020-11-17 VITALS — BP 105/64 | HR 79 | Temp 98.7°F | Ht 66.0 in | Wt 206.4 lb

## 2020-11-17 DIAGNOSIS — E785 Hyperlipidemia, unspecified: Secondary | ICD-10-CM | POA: Diagnosis not present

## 2020-11-17 DIAGNOSIS — E559 Vitamin D deficiency, unspecified: Secondary | ICD-10-CM | POA: Diagnosis not present

## 2020-11-17 DIAGNOSIS — D500 Iron deficiency anemia secondary to blood loss (chronic): Secondary | ICD-10-CM | POA: Diagnosis not present

## 2020-11-17 DIAGNOSIS — F411 Generalized anxiety disorder: Secondary | ICD-10-CM

## 2020-11-17 DIAGNOSIS — J432 Centrilobular emphysema: Secondary | ICD-10-CM | POA: Diagnosis not present

## 2020-11-17 DIAGNOSIS — F5104 Psychophysiologic insomnia: Secondary | ICD-10-CM

## 2020-11-17 DIAGNOSIS — K719 Toxic liver disease, unspecified: Secondary | ICD-10-CM | POA: Diagnosis not present

## 2020-11-17 DIAGNOSIS — Z780 Asymptomatic menopausal state: Secondary | ICD-10-CM | POA: Diagnosis not present

## 2020-11-17 DIAGNOSIS — E119 Type 2 diabetes mellitus without complications: Secondary | ICD-10-CM | POA: Diagnosis not present

## 2020-11-17 DIAGNOSIS — T466X5A Adverse effect of antihyperlipidemic and antiarteriosclerotic drugs, initial encounter: Secondary | ICD-10-CM

## 2020-11-17 LAB — BAYER DCA HB A1C WAIVED: HB A1C (BAYER DCA - WAIVED): 5.3 % (ref <7.0)

## 2020-11-17 MED ORDER — IPRATROPIUM-ALBUTEROL 0.5-2.5 (3) MG/3ML IN SOLN: 3.0000 mL | Freq: Four times a day (QID) | RESPIRATORY_TRACT | 1 refills | Status: DC | PRN

## 2020-11-17 MED ORDER — FEXOFENADINE HCL 180 MG PO TABS: 180.0000 mg | ORAL_TABLET | Freq: Every day | ORAL | 11 refills | Status: DC

## 2020-11-17 MED ORDER — METFORMIN HCL ER 750 MG PO TB24: 750.0000 mg | ORAL_TABLET | Freq: Every day | ORAL | 3 refills | Status: DC

## 2020-11-17 MED ORDER — POLYSACCHARIDE IRON COMPLEX 125 MG/5ML PO LIQD: 5.0000 mL | Freq: Every day | ORAL | 2 refills | Status: DC

## 2020-11-17 MED ORDER — ALBUTEROL SULFATE HFA 108 (90 BASE) MCG/ACT IN AERS: 2.0000 | INHALATION_SPRAY | Freq: Four times a day (QID) | RESPIRATORY_TRACT | 0 refills | Status: DC | PRN

## 2020-11-17 NOTE — Progress Notes (Signed)
Subjective:  Patient ID: Kimberly Obrien,  female    DOB: October 27, 1960  Age: 60 y.o.    CC: Medical Management of Chronic Issues   HPI Kimberly Obrien presents for  follow-up of hypertension. Patient has no history of headache chest pain or shortness of breath or recent cough. Patient also denies symptoms of TIA such as numbness weakness lateralizing. Patient denies side effects from medication. States taking it regularly.  Patient also  in for follow-up of elevated cholesterol. Doing well without complaints on current medication. Denies side effects  including myalgia and arthralgia and nausea. Also in today for liver function testing. Currently no chest pain, shortness of breath or other cardiovascular related symptoms noted.  Follow-up of diabetes. Patient does check blood sugar at home. Readings run between 100 and 130 Patient denies symptoms such as excessive hunger or urinary frequency, excessive hunger, nausea No significant hypoglycemic spells noted. Medications reviewed. Pt reports taking them regularly. Pt. denies complication/adverse reaction today.   HA and congestion from allergy.  History Kimberly Obrien has a past medical history of Anemia, Anticoagulation monitoring by pharmacist, Arthritis, Asthma, Blood transfusion without reported diagnosis, Cataract, Cirrhosis ([place]), COPD (chronic obstructive pulmonary disease) ([place]), Diabetes mellitus without complication ([place]), Dyspnea, Elevated hemoglobin A1c (03/01/2018), Family history of Hodgkin's lymphoma, Family history of leukemia, Family history of lung cancer, Family history of stomach cancer, Family history of uterine cancer, History of colon polyps, History of pulmonary embolism (02/04/2013), Hyperlipidemia, NASH (nonalcoholic steatohepatitis), Neuromuscular disorder ([place]), PE (pulmonary embolism), and Sleep apnea.   She has a past surgical history that includes Tubal ligation; Wrist surgery (Right); Cholecystectomy; Spine  surgery; Abdominal hysterectomy; Eye surgery; left and right heart catheterization with coronary angiogram (N/A, 05/13/2014); Incontinence surgery (2009); Cataract extraction, bilateral (Bilateral); Esophagogastroduodenoscopy (egd) with propofol (N/A, 05/07/2017); Colonoscopy with propofol (N/A, 05/07/2017); IR Venogram Hepatic W Hemodynamic Evaluation (03/15/2018); IR Transcatheter BX (03/15/2018); and Breast biopsy.   Her family history includes Bipolar disorder in her father; Cancer in her cousin, maternal uncle, paternal aunt, paternal aunt, and paternal uncle; Cirrhosis in her mother; Colon polyps in her sister; Endometrial cancer (age of onset: 74) in her daughter; Heart disease in her father; Heart failure in her father and paternal grandfather; Hodgkin's lymphoma (age of onset: 58) in an other family member; Hyperlipidemia in her father; Hypertension in her brother; Irritable bowel syndrome in her mother and sister; Leukemia in her cousin; Lung cancer in her maternal uncle; Obesity in her daughter; Stomach cancer in her maternal uncle.She reports that she quit smoking about 8 years ago. Her smoking use included cigarettes. She started smoking about 43 years ago. She has a 100.00 pack-year smoking history. She has never used smokeless tobacco. She reports that she does not drink alcohol and does not use drugs.  Current Outpatient Medications on File Prior to Visit  Medication Sig Dispense Refill  . acetaminophen (TYLENOL) 500 MG tablet Take 500 mg by mouth every 6 (six) hours as needed.    . Blood Glucose Monitoring Suppl (ONETOUCH VERIO REFLECT) w/Device KIT Test BS twice daily Dx e11.9 1 kit 0  . fluticasone (FLONASE) 50 MCG/ACT nasal spray Place 2 sprays into both nostrils daily. 16 g 11  . furosemide (LASIX) 20 MG tablet Take 1 tablet (20 mg total) by mouth as needed. 30 tablet 3  . Lancets (ONETOUCH DELICA PLUS [id number]) MISC CHECK BLOOD SUGAR TWICE  DAILY AND AS NEEDED Dx E11.9 200 each 3  .  ONETOUCH VERIO test strip TEST BLOOD  SUGAR TWICE  DAILY 200 strip 3  . traZODone (DESYREL) 150 MG tablet Use from 1/3 to 1 tablet nightly as needed for sleep. 90 tablet 1  . triamcinolone cream (KENALOG) 0.1 % Apply 1 application topically 3 (three) times daily. Avoid face and genitalia 45 g 0   No current facility-administered medications on file prior to visit.    ROS Review of Systems  Constitutional: Negative.   HENT: Negative.   Eyes: Negative for visual disturbance.  Respiratory: Negative for shortness of breath.   Cardiovascular: Negative for chest pain.  Gastrointestinal: Negative for abdominal pain.  Musculoskeletal: Negative for arthralgias.    Objective:  BP 105/64   Pulse 79   Temp 98.7 F (37.1 C)   Ht 5' 6"  (1.676 m)   Wt 206 lb 6.4 oz (93.6 kg)   SpO2 96%   BMI 33.31 kg/m   BP Readings from Last 3 Encounters:  11/17/20 105/64  10/05/20 (!) 128/50  08/18/20 121/71    Wt Readings from Last 3 Encounters:  11/17/20 206 lb 6.4 oz (93.6 kg)  10/05/20 206 lb 11.2 oz (93.8 kg)  08/18/20 207 lb (93.9 kg)     Physical Exam Constitutional:      General: She is not in acute distress.    Appearance: She is well-developed.  Cardiovascular:     Rate and Rhythm: Normal rate and regular rhythm.  Pulmonary:     Breath sounds: Normal breath sounds.  Skin:    General: Skin is warm and dry.  Neurological:     Mental Status: She is alert and oriented to person, place, and time.     Diabetic Foot Exam - Simple   No data filed       Assessment & Plan:   Kimberly Obrien was seen today for medical management of chronic issues.  Diagnoses and all orders for this visit:  Diabetes mellitus without complication ([place]) -     Bayer DCA Hb A1c Waived -     CBC with Differential/Platelet -     CMP14+EGFR -     metFORMIN (GLUCOPHAGE-XR) 750 MG 24 hr tablet; Take 1 tablet (750 mg total) by mouth daily with breakfast.  Centrilobular emphysema (HCC) -      ipratropium-albuterol (DUONEB) 0.5-2.5 (3) MG/3ML SOLN; Take 3 mLs by nebulization every 6 (six) hours as needed.  GAD (generalized anxiety disorder)  Hepatotoxicity due to statin drug  Hyperlipidemia, unspecified hyperlipidemia type  Psychophysiological insomnia  Vitamin D deficiency  Iron deficiency anemia due to chronic blood loss  Postmenopausal -     DG Bone Density; Future  Other orders -     fexofenadine (ALLEGRA) 180 MG tablet; Take 1 tablet (180 mg total) by mouth daily. For allergy symptoms -     Polysaccharide Iron Complex 125 MG/5ML LIQD; Take 5 mLs by mouth daily. -     albuterol (VENTOLIN HFA) 108 (90 Base) MCG/ACT inhaler; Inhale 2 puffs into the lungs every 6 (six) hours as needed for wheezing or shortness of breath.   I have changed Kimberly Obrien's metFORMIN. I am also having her start on fexofenadine. Additionally, I am having her maintain her acetaminophen, fluticasone, triamcinolone, OneTouch Verio Reflect, traZODone, furosemide, OneTouch Delica Plus [id number], OneTouch Verio, Polysaccharide Iron Complex, ipratropium-albuterol, and albuterol.  Meds ordered this encounter  Medications  . fexofenadine (ALLEGRA) 180 MG tablet    Sig: Take 1 tablet (180 mg total) by mouth daily. For allergy symptoms    Dispense:  30 tablet  Refill:  11  . Polysaccharide Iron Complex 125 MG/5ML LIQD    Sig: Take 5 mLs by mouth daily.    Dispense:  150 mL    Refill:  2  . ipratropium-albuterol (DUONEB) 0.5-2.5 (3) MG/3ML SOLN    Sig: Take 3 mLs by nebulization every 6 (six) hours as needed.    Dispense:  360 mL    Refill:  1  . albuterol (VENTOLIN HFA) 108 (90 Base) MCG/ACT inhaler    Sig: Inhale 2 puffs into the lungs every 6 (six) hours as needed for wheezing or shortness of breath.    Dispense:  18 g    Refill:  0  . metFORMIN (GLUCOPHAGE-XR) 750 MG 24 hr tablet    Sig: Take 1 tablet (750 mg total) by mouth daily with breakfast.    Dispense:  90 tablet     Refill:  3     Follow-up: Return in about 6 months (around 05/20/2021).  Claretta Fraise, M.D.

## 2020-11-18 DIAGNOSIS — M85852 Other specified disorders of bone density and structure, left thigh: Secondary | ICD-10-CM | POA: Diagnosis not present

## 2020-11-18 DIAGNOSIS — Z780 Asymptomatic menopausal state: Secondary | ICD-10-CM | POA: Diagnosis not present

## 2020-11-18 LAB — CMP14+EGFR
ALT: 25 IU/L (ref 0–32)
AST: 37 IU/L (ref 0–40)
Albumin/Globulin Ratio: 1.8 (ref 1.2–2.2)
Albumin: 3.7 g/dL — ABNORMAL LOW (ref 3.8–4.9)
Alkaline Phosphatase: 89 IU/L (ref 44–121)
BUN/Creatinine Ratio: 12 (ref 12–28)
BUN: 8 mg/dL (ref 8–27)
Bilirubin Total: 2.2 mg/dL — ABNORMAL HIGH (ref 0.0–1.2)
CO2: 21 mmol/L (ref 20–29)
Calcium: 8.6 mg/dL — ABNORMAL LOW (ref 8.7–10.3)
Chloride: 105 mmol/L (ref 96–106)
Creatinine, Ser: 0.65 mg/dL (ref 0.57–1.00)
Globulin, Total: 2.1 g/dL (ref 1.5–4.5)
Glucose: 208 mg/dL — ABNORMAL HIGH (ref 65–99)
Potassium: 4 mmol/L (ref 3.5–5.2)
Sodium: 143 mmol/L (ref 134–144)
Total Protein: 5.8 g/dL — ABNORMAL LOW (ref 6.0–8.5)
eGFR: 101 mL/min/{1.73_m2} (ref >59)

## 2020-11-18 LAB — CBC WITH DIFFERENTIAL/PLATELET
Basophils Absolute: 0 10*3/uL (ref 0.0–0.2)
Basos: 0 %
EOS (ABSOLUTE): 0 10*3/uL (ref 0.0–0.4)
Eos: 1 %
Hematocrit: 40.5 % (ref 34.0–46.6)
Hemoglobin: 13.7 g/dL (ref 11.1–15.9)
Immature Grans (Abs): 0 10*3/uL (ref 0.0–0.1)
Immature Granulocytes: 0 %
Lymphocytes Absolute: 0.8 10*3/uL (ref 0.7–3.1)
Lymphs: 18 %
MCH: 28.7 pg (ref 26.6–33.0)
MCHC: 33.8 g/dL (ref 31.5–35.7)
MCV: 85 fL (ref 79–97)
Monocytes Absolute: 0.2 10*3/uL (ref 0.1–0.9)
Monocytes: 5 %
Neutrophils Absolute: 3.4 10*3/uL (ref 1.4–7.0)
Neutrophils: 76 %
Platelets: 84 10*3/uL — CL (ref 150–450)
RBC: 4.78 x10E6/uL (ref 3.77–5.28)
RDW: 13.9 % (ref 11.7–15.4)
WBC: 4.5 10*3/uL (ref 3.4–10.8)

## 2020-11-19 ENCOUNTER — Telehealth: Payer: Self-pay

## 2020-11-19 NOTE — Telephone Encounter (Signed)
Pt had an apt 11/17/2020 with Kimberly Obrien and her rx still have not been sent in to [place]

## 2020-11-19 NOTE — Telephone Encounter (Signed)
Spoke with pharmacist at [facility], Delma Freeze is not covered by patient's insurance because it is available over the counter.  Any over the counter allergy med will be denied by her insurance.  Patient would like to know if you will send in an allergy med that is prescription only to [facility].  She is aware you are out of the office today.

## 2020-11-22 ENCOUNTER — Other Ambulatory Visit: Payer: Self-pay | Admitting: Family Medicine

## 2020-11-22 MED ORDER — MONTELUKAST SODIUM 10 MG PO TABS
10.0000 mg | ORAL_TABLET | Freq: Every day | ORAL | 3 refills | Status: DC
Start: 2020-11-22 — End: 2021-07-06

## 2020-11-22 NOTE — Telephone Encounter (Signed)
PATIENT AWARE

## 2020-11-22 NOTE — Telephone Encounter (Signed)
Please let the patient know that I sent their prescription to their pharmacy. Thanks, WS

## 2020-12-15 ENCOUNTER — Encounter: Payer: Self-pay | Admitting: *Deleted

## 2020-12-16 ENCOUNTER — Encounter: Payer: Self-pay | Admitting: Family Medicine

## 2020-12-16 ENCOUNTER — Ambulatory Visit (INDEPENDENT_AMBULATORY_CARE_PROVIDER_SITE_OTHER): Payer: Medicare Other | Admitting: Family Medicine

## 2020-12-16 DIAGNOSIS — N3001 Acute cystitis with hematuria: Secondary | ICD-10-CM

## 2020-12-16 MED ORDER — ONDANSETRON HCL 4 MG PO TABS: 4.0000 mg | ORAL_TABLET | Freq: Three times a day (TID) | ORAL | 0 refills | Status: DC | PRN

## 2020-12-16 MED ORDER — NITROFURANTOIN MONOHYD MACRO 100 MG PO CAPS: 100.0000 mg | ORAL_CAPSULE | Freq: Two times a day (BID) | ORAL | 0 refills | Status: AC

## 2020-12-16 NOTE — Progress Notes (Signed)
Virtual Visit  Note Due to COVID-19 pandemic this visit was conducted virtually. This visit type was conducted due to national recommendations for restrictions regarding the COVID-19 Pandemic (e.g. social distancing, sheltering in place) in an effort to limit this patient's exposure and mitigate transmission in our community. All issues noted in this document were discussed and addressed.  A physical exam was not performed with this format.  I connected with Kimberly Obrien on 12/16/20 at 66 by telephone and verified that I am speaking with the correct person using two identifiers. Kimberly Obrien is currently located at home and no one is currently with her during visit. The provider, Gwenlyn Perking, FNP is located in their office at time of visit.  I discussed the limitations, risks, security and privacy concerns of performing an evaluation and management service by telephone and the availability of in person appointments. I also discussed with the patient that there may be a patient responsible charge related to this service. The patient expressed understanding and agreed to proceed.  CC: lower abdominal pain  History and Present Illness:  HPI  Kimberly Obrien reports lower abdominal pain x 4 days that is intermittent and feels like a pressure. Her lower back is also hurting, more so on the right side. She has noticed blood on the toilet paper twice while wiping. Denies dysuria. She has had a bladder tack in the past, since this she has not been able to feel dysuria with a UTI. Denies fever, chills, nausea, or vomiting.    ROS As per HPI.   Observations/Objective: Alert and oriented x 3. Able to speak in full sentences without difficulty.   Urine dipstick shows positive for RBC's and positive for leukocytes.  Micro exam: 6-11 WBC's per HPF, 0-2 RBC's per HPF and many+ bacteria.  Assessment and Plan: Diagnoses and all orders for this visit:  Acute cystitis with hematuria Start  macrobid as below. She reports that she can tolerate macrobid as long as it she eat when she takes it. Zofran given in case of nausea with macrobid. Culture pending. Return to office for new or worsening symptoms, or if symptoms persist.  -     Urine Culture -     nitrofurantoin, macrocrystal-monohydrate, (MACROBID) 100 MG capsule; Take 1 capsule (100 mg total) by mouth 2 (two) times daily for 5 days. 1 po BId -     ondansetron (ZOFRAN) 4 MG tablet; Take 1 tablet (4 mg total) by mouth every 8 (eight) hours as needed for nausea or vomiting.     Follow Up Instructions: As needed.     I discussed the assessment and treatment plan with the patient. The patient was provided an opportunity to ask questions and all were answered. The patient agreed with the plan and demonstrated an understanding of the instructions.   The patient was advised to call back or seek an in-person evaluation if the symptoms worsen or if the condition fails to improve as anticipated.  The above assessment and management plan was discussed with the patient. The patient verbalized understanding of and has agreed to the management plan. Patient is aware to call the clinic if symptoms persist or worsen. Patient is aware when to return to the clinic for a follow-up visit. Patient educated on when it is appropriate to go to the emergency department.   Time call ended:  1539  I provided 11 minutes of  non face-to-face time during this encounter.    Gwenlyn Perking, FNP

## 2020-12-19 LAB — URINE CULTURE

## 2020-12-29 ENCOUNTER — Other Ambulatory Visit (INDEPENDENT_AMBULATORY_CARE_PROVIDER_SITE_OTHER): Payer: Medicare Other

## 2020-12-29 ENCOUNTER — Encounter: Payer: Self-pay | Admitting: Gastroenterology

## 2020-12-29 ENCOUNTER — Ambulatory Visit: Payer: Medicare Other | Admitting: Gastroenterology

## 2020-12-29 VITALS — BP 114/68 | HR 88 | Ht 66.0 in | Wt 208.2 lb

## 2020-12-29 DIAGNOSIS — K7460 Unspecified cirrhosis of liver: Secondary | ICD-10-CM

## 2020-12-29 DIAGNOSIS — I8500 Esophageal varices without bleeding: Secondary | ICD-10-CM

## 2020-12-29 DIAGNOSIS — Z86010 Personal history of colonic polyps: Secondary | ICD-10-CM

## 2020-12-29 DIAGNOSIS — R197 Diarrhea, unspecified: Secondary | ICD-10-CM

## 2020-12-29 DIAGNOSIS — R109 Unspecified abdominal pain: Secondary | ICD-10-CM | POA: Diagnosis not present

## 2020-12-29 LAB — PROTIME-INR
INR: 1.3 ratio — ABNORMAL HIGH (ref 0.8–1.0)
Prothrombin Time: 14.7 s — ABNORMAL HIGH (ref 9.6–13.1)

## 2020-12-29 MED ORDER — COLESTIPOL HCL 1 G PO TABS: 1.0000 g | ORAL_TABLET | Freq: Two times a day (BID) | ORAL | 3 refills | Status: DC

## 2020-12-29 NOTE — Progress Notes (Signed)
HPI :  60 year old female here for a follow up visit for NASH cirrhosis, abdominal pain.   Recall that she previously followed at [place], [place] hepatology clinic (Dr. Lurlean Leyden), and [facility] with cirrhosis several years ago. She had a positive ANA and mildly positive smooth muscle antibody and it sounds like she was started empirically on steroids at one point in time as well as budesonide.  Her liver enzymes were fluctuating.  She was seen by [place] / Dr. Precious Gilding office in 2018 who reviewed her history and recommended a liver biopsy.  At that point time biopsy showed grade 2 of 3 steatohepatitis along with cirrhosis.  Biopsy was most suggestive of Karlene Lineman cirrhosis and not autoimmune hepatitis.  Her steroids were stopped at that time.  Her mother died of cirrhosis thought to be due to fatty liver disease.  The patient emphatically denies any significant alcohol use historically.  She really does not drink any alcohol at all at this time.   She has had a history of iron deficiency anemia and GI bleeding in the past.  In 2018 with [facility] she had EGD and colonoscopy which did not yield cause for bleeding, reportedly had a questionable vascular lesion on a capsule endoscopy at that time.  She does not tolerate oral iron very well, and had a reaction to IV iron in the past.  Since her last visit in our office she underwent an EGD and colonoscopy with me this past December.  She was found to have small esophageal varices and portal hypertensive gastritis.  Biopsies negative for H. pylori.  She is also found to have 16 adenomatous polyps that were removed.  She subsequently had an evaluation by genetics, she states she had negative genetic testing.  She is due for repeat colonoscopy in 1 year.  She has not had any decompensation since I last seen her.  She had an ultrasound of her right upper quadrant in March which showed stable changes of cirrhosis.  She does endorse some loose stools that she  has in the postprandial setting most frequently.  This is been ongoing for a while.  She has a history of cholecystectomy.  She also inquires about right upper quadrant discomfort that has bothered her.  She is tender to the touch along certain portion of her right upper quadrant.  This is often made worse by positional changes such as lifting, or straining, or lying on it.  She frequently notices it at night when sleeping and when she changes position goes away.    Not immune to hep A / B 05/2019 -she states she declined vaccination Alpha one, AMA nehgative, iron studies okay, celiac serology negative   Liver biopsy 03/15/18 -  Liver, needle/core biopsy, transjug - MILD TO MODERATELY ACTIVE STEATOHEPATITIS (GRADE 1-2 OF 3; IF DETERMINED TO BE NAFLD, THEN THE NAS = 3 OF 8). - CIRRHOSIS (STAGE 4 OF 4) The biopsy is adequate for review and consists of several cores of liver with a distorted architecture. Trichrome stain highlights the presence of septal fibrosis as well as a pericellular pattern of fibrosis. Hepatic nodules show mild macrovesicular steatosis (approximately 20%) with mild to moderate ballooning degeneration and few poorly formed Mallory hyalins. Hepatic nodules do not show any significant inflammation. Definitive megamitochondria are not seen. The portal tracts and fibrous septa mild to moderate, mostly lymphocytic inflammation without significant interface hepatitis. Mild ductular reaction is present with associated nonspecific neutrophilic response. Iron stain shows moderate granular  iron accumulation within Kupffer cells, consistent with secondary iron overload. PASD stain shows no globular inclusions in hepatocytes. The findings are consistent with mold to moderately acive steatohepatitis, alcoholic and/or non-alcoholic, with cirrhosis.   EGD 07/29/20 -  - A 1 cm hiatal hernia was present. - Small (< 5 mm) varices were found in the lower third of the esophagus without any  high risk stigmata for bleeding - The exam of the esophagus was otherwise normal. - Moderate suspected portal hypertensive gastropathy was found in the entire examined stomach - snakeskin appearance with erythema and adherent heme. Biopsies were taken with a cold forceps for Helicobacter pylori testing. - A single 4-5 mm sessile polyp was found in the gastric fundus with a yellowish hue. Biopsies were taken with a cold forceps for histology. - The exam of the stomach was otherwise normal. No gastric varices - The duodenal bulb and second portion of the duodenum were normal.  Repeat EGD in one year   Colonoscopy 07/29/20 - One 5 mm polyp in the ascending colon, removed with a cold snare. Resected and retrieved. - Two 5 mm polyps at the hepatic flexure, removed with a cold snare. Resected and retrieved. - Five 4 to 8 mm polyps in the transverse colon, removed with a cold snare. Resected and retrieved. - Two 4 to 5 mm polyps in the descending colon, removed with a cold snare. Resected and retrieved. - Four 3 to 4 mm polyps in the sigmoid colon, removed with a cold snare. Resected and retrieved. - Two 3 to 4 mm polyps in the rectum, removed with a cold snare. Resected and retrieved. - A few recently bleeding colonic angiodysplastic lesions. - Internal hemorrhoids. - The examination was otherwise normal.  1. Surgical [P], stomach, gastric - MILD CHRONIC GASTRITIS. MILD REACTIVE GASTROPATHY. - WARTHIN-STARRY STAIN IS NEGATIVE FOR HELICOBACTER PYLORI. 2. Surgical [P], stomach, gastric polyp (1) - HYPERPLASTIC POLYP. - NEGATIVE FOR GOBLET CELL METAPLASIA. 3. Surgical [P], colon, sigmoid, hepatic flexure, ascending, transverse, descending, rectal, polyps (16) - TUBULAR ADENOMA (X MULTIPLE). - NEGATIVE FOR HIGH GRADE DYSPLASIA.   RUQ [place] 10/21/20 - IMPRESSION: Parenchymal changes in keeping with cirrhosis. No focal intrahepatic mass.    Past Medical History:  Diagnosis Date  . Anemia    . Anticoagulation monitoring by pharmacist   . Arthritis   . Asthma   . Blood transfusion without reported diagnosis   . Cataract   . Cirrhosis ([place])   . COPD (chronic obstructive pulmonary disease) ([place])   . Diabetes mellitus without complication ([place])   . Dyspnea    exertion  . Elevated hemoglobin A1c 03/01/2018  . Family history of Hodgkin's lymphoma   . Family history of leukemia   . Family history of lung cancer   . Family history of stomach cancer   . Family history of uterine cancer   . History of colon polyps   . History of pulmonary embolism 02/04/2013  . Hyperlipidemia   . NASH (nonalcoholic steatohepatitis)   . Neuromuscular disorder ([place])    back surgery  . PE (pulmonary embolism)   . Sleep apnea      Past Surgical History:  Procedure Laterality Date  . ABDOMINAL HYSTERECTOMY    . BREAST BIOPSY    . CATARACT EXTRACTION, BILATERAL Bilateral   . CHOLECYSTECTOMY    . COLONOSCOPY WITH PROPOFOL N/A 05/07/2017   Procedure: COLONOSCOPY WITH PROPOFOL;  Surgeon: Laurence Spates, MD;  Location: [facility];  Service: Endoscopy;  Laterality: N/A;  .  ESOPHAGOGASTRODUODENOSCOPY (EGD) WITH PROPOFOL N/A 05/07/2017   Procedure: ESOPHAGOGASTRODUODENOSCOPY (EGD) WITH PROPOFOL;  Surgeon: Laurence Spates, MD;  Location: [facility];  Service: Endoscopy;  Laterality: N/A;  . EYE SURGERY     Cataracts  . INCONTINENCE SURGERY  2009  . IR TRANSCATHETER BX  03/15/2018  . IR VENOGRAM HEPATIC W HEMODYNAMIC EVALUATION  03/15/2018  . LEFT AND RIGHT HEART CATHETERIZATION WITH CORONARY ANGIOGRAM N/A 05/13/2014   Procedure: LEFT AND RIGHT HEART CATHETERIZATION WITH CORONARY ANGIOGRAM;  Surgeon: [name], MD;  Location: [facility] CATH LAB;  Service: Cardiovascular;  Laterality: N/A;  . SPINE SURGERY     L5 S1 fusion  . TUBAL LIGATION    . WRIST SURGERY Right    Had surgery twice to shave bone for blood circulation improvement.   Family History  Problem Relation Age of Onset  .  Cirrhosis Mother        NASH  . Irritable bowel syndrome Mother   . Hyperlipidemia Father   . Heart failure Father   . Bipolar disorder Father   . Heart disease Father   . Obesity Daughter   . Endometrial cancer Daughter 70  . Hypertension Brother   . Colon polyps Sister        unknown number but fewer than patient  . Irritable bowel syndrome Sister   . Heart failure Paternal Grandfather   . Stomach cancer Maternal Uncle        dx 40s/50s  . Cancer Paternal Aunt        NOS  . Cancer Paternal Uncle        NOS  . Lung cancer Maternal Uncle        dx 40s/50s, smoker  . Cancer Maternal Uncle        NOS  . Cancer Paternal Aunt        NOS  . Leukemia Cousin        dx 60s, died 2 weeks after diagnosis (maternal first cousin)  . Cancer Cousin        NOS (paternal first cousin)  . Hodgkin's lymphoma Other 14       4th degree relative (mother's half-sibling's son)  . Heart attack Neg Hx   . Miscarriages / Stillbirths Neg Hx   . Colon cancer Neg Hx   . Esophageal cancer Neg Hx   . Rectal cancer Neg Hx    Social History   Tobacco Use  . Smoking status: Former Smoker    Packs/day: 2.00    Years: 50.00    Pack years: 100.00    Types: Cigarettes    Start date: 08/22/1977    Quit date: 04/21/2012    Years since quitting: 8.6  . Smokeless tobacco: Never Used  Vaping Use  . Vaping Use: Never used  Substance Use Topics  . Alcohol use: No  . Drug use: No   Current Outpatient Medications  Medication Sig Dispense Refill  . acetaminophen (TYLENOL) 500 MG tablet Take 500 mg by mouth every 6 (six) hours as needed.    Marland Kitchen albuterol (VENTOLIN HFA) 108 (90 Base) MCG/ACT inhaler Inhale 2 puffs into the lungs every 6 (six) hours as needed for wheezing or shortness of breath. 18 g 0  . Blood Glucose Monitoring Suppl (ONETOUCH VERIO REFLECT) w/Device KIT Test BS twice daily Dx e11.9 1 kit 0  . fexofenadine (ALLEGRA) 180 MG tablet Take 1 tablet (180 mg total) by mouth daily. For allergy  symptoms 30 tablet 11  . fluticasone (FLONASE)  50 MCG/ACT nasal spray Place 2 sprays into both nostrils daily. 16 g 11  . furosemide (LASIX) 20 MG tablet Take 1 tablet (20 mg total) by mouth as needed. 30 tablet 3  . ipratropium-albuterol (DUONEB) 0.5-2.5 (3) MG/3ML SOLN Take 3 mLs by nebulization every 6 (six) hours as needed. 360 mL 1  . Lancets (ONETOUCH DELICA PLUS [id number]) MISC CHECK BLOOD SUGAR TWICE  DAILY AND AS NEEDED Dx E11.9 200 each 3  . metFORMIN (GLUCOPHAGE-XR) 750 MG 24 hr tablet Take 1 tablet (750 mg total) by mouth daily with breakfast. 90 tablet 3  . montelukast (SINGULAIR) 10 MG tablet Take 1 tablet (10 mg total) by mouth at bedtime. For allergy 30 tablet 3  . ondansetron (ZOFRAN) 4 MG tablet Take 1 tablet (4 mg total) by mouth every 8 (eight) hours as needed for nausea or vomiting. 20 tablet 0  . ONETOUCH VERIO test strip TEST BLOOD SUGAR TWICE  DAILY 200 strip 3  . traZODone (DESYREL) 150 MG tablet Use from 1/3 to 1 tablet nightly as needed for sleep. 90 tablet 1  . triamcinolone cream (KENALOG) 0.1 % Apply 1 application topically 3 (three) times daily. Avoid face and genitalia 45 g 0  . Polysaccharide Iron Complex 125 MG/5ML LIQD Take 5 mLs by mouth daily. 150 mL 2   No current facility-administered medications for this visit.   Allergies  Allergen Reactions  . Aciphex [Rabeprazole Sodium] Rash  . Fluconazole Rash and Hives  . Lansoprazole Hives  . Septra [Sulfamethoxazole-Trimethoprim] Nausea And Vomiting    "made me bleed"  . Sucralfate Nausea Only  . Vancomycin Diarrhea  . Claritin [Loratadine] Other (See Comments)    Tired   . Crestor [Rosuvastatin] Other (See Comments)    Elevated blood glucose and abdominal pain  . Doxycycline Rash  . Ibuprofen Other (See Comments)    Upset stomach  . Lipitor [Atorvastatin] Other (See Comments)    Myalgias, leg pain.  Problems with pancreas, sugars went up also.  Santiago Bur [Nitrofurantoin Macrocrystal] Nausea And  Vomiting  . Metoprolol Nausea And Vomiting  . Metronidazole Other (See Comments) and Nausea Only    Dizziness, nausea, dry mouth and some shortness of breath  . Nsaids Other (See Comments)    Upset stomach  . Omeprazole     bloating  . Rabeprazole Rash     Review of Systems: All systems reviewed and negative except where noted in HPI.   Lab Results  Component Value Date   WBC 4.5 11/17/2020   HGB 13.7 11/17/2020   HCT 40.5 11/17/2020   MCV 85 11/17/2020   PLT 84 (LL) 11/17/2020    Lab Results  Component Value Date   CREATININE 0.65 11/17/2020   BUN 8 11/17/2020   NA 143 11/17/2020   K 4.0 11/17/2020   CL 105 11/17/2020   CO2 21 11/17/2020    Lab Results  Component Value Date   ALT 25 11/17/2020   AST 37 11/17/2020   ALKPHOS 89 11/17/2020   BILITOT 2.2 (H) 11/17/2020     Physical Exam: BP 114/68   Pulse 88   Ht 5' 6"  (1.676 m)   Wt 208 lb 3.2 oz (94.4 kg)   BMI 33.60 kg/m  Constitutional: Pleasant,well-developed, female in no acute distress. Abdominal: Soft, nondistended, focal tenderness along scar RUQ, (+) [place]. There are no masses palpable. No hepatomegaly. Extremities: no edema Lymphadenopathy: No cervical adenopathy noted. Neurological: Alert and oriented to person place and time. Skin: Skin is  warm and dry. No rashes noted. Psychiatric: Normal mood and affect. Behavior is normal.   ASSESSMENT AND PLAN: 60 year old female here for reassessment following:  Cirrhosis - secondary to NASH Esophageal varices Diarrhea Abdominal wall pain History of colon polyps  Cirrhosis is compensated at this time.  We discussed risks for decompensation and HCC.  Her ultrasound is up-to-date, repeat ultrasound in September.  She does have small esophageal varices on EGD, will be repeating EGD in December.  Otherwise she is avoiding alcohol and has been stable since her last visit.  Declines vaccinations to hep a and B.  Due for INR and AFP today, otherwise  continue to follow-up every 6 months.  Discussed her bowel symptoms, suspect could be related to postcholecystectomy state.  Offered trial of Colestid 1 g twice daily to see if that helps, she can titrate up or down as needed.  Regarding her abdominal pain, appears to be localized over cholecystectomy scar reliably reproduced, suspect abdominal wall pain/nerve impingement.  I think she may benefit from a trigger point injection.  We will bring her back at another visit in the next few weeks and we have more time to do that, she is agreeable.  Otherwise adenoma burden on her last colonoscopy with negative genetic testing, repeat colonoscopy in December.  Plan: - RUQ [place] in 9/22 - EGD and colonoscopy 07/2021 - INR and AFP today - trial of colestid 1gm BID - trigger point injection for abdominal wall pain, will coordinate visit for that in the near future  Frazee Cellar, MD [facility]

## 2020-12-29 NOTE — Patient Instructions (Addendum)
If you are age 60 or older, your body mass index should be between 23-30. Your Body mass index is 33.6 kg/m. If this is out of the aforementioned range listed, please consider follow up with your Primary Care Provider.  If you are age 70 or younger, your body mass index should be between 19-25. Your Body mass index is 33.6 kg/m. If this is out of the aformentioned range listed, please consider follow up with your Primary Care Provider.   Please go to the lab in the basement of our building to have lab work done as you leave today. Hit "B" for basement when you get on the elevator.  When the doors open the lab is on your left.  We will call you with the results. Thank you.  Due to recent changes in healthcare laws, you may see the results of your imaging and laboratory studies on MyChart before your provider has had a chance to review them.  We understand that in some cases there may be results that are confusing or concerning to you. Not all laboratory results come back in the same time frame and the provider may be waiting for multiple results in order to interpret others.  Please give [place] 48 hours in order for your provider to thoroughly review all the results before contacting the office for clarification of your results.   We have sent the following medications to your pharmacy for you to pick up at your convenience: Colestid 1 g: Take twice a day and titrate as needed not to 2 tablets twice a day  You will be due for a recall EGD/colonoscopy in December 2022. We will send you a reminder in the mail when it gets closer to that time.  You will be due for a recall Ultrasound for [facility] screening in September 2022. We will send you a reminder in the mail when it gets closer to that time.   You have been scheduled for a Trigger Point Injection on Tuesday, 5-24 at 11:30am.  Thank you for entrusting me with your care and for choosing [facility], Dr. [place] Cellar

## 2020-12-30 LAB — AFP TUMOR MARKER: AFP-Tumor Marker: 3.8 ng/mL

## 2021-01-11 ENCOUNTER — Encounter: Payer: Self-pay | Admitting: Gastroenterology

## 2021-01-11 ENCOUNTER — Ambulatory Visit: Payer: Medicare Other | Admitting: Gastroenterology

## 2021-01-11 VITALS — BP 118/60 | HR 84 | Ht 64.0 in | Wt 206.2 lb

## 2021-01-11 DIAGNOSIS — R109 Unspecified abdominal pain: Secondary | ICD-10-CM

## 2021-01-11 NOTE — Patient Instructions (Signed)
If you are age 60 or older, your body mass index should be between 23-30. Your Body mass index is 35.4 kg/m. If this is out of the aforementioned range listed, please consider follow up with your Primary Care Provider.  If you are age 31 or younger, your body mass index should be between 19-25. Your Body mass index is 35.4 kg/m. If this is out of the aformentioned range listed, please consider follow up with your Primary Care Provider.   We have given you a Trigger Point Injection today.  Please follow up at needed.   Thank you for entrusting me with your care and for choosing [facility], Dr. Theresa Cellar

## 2021-01-11 NOTE — Progress Notes (Signed)
HPI :  60 year old female here for a a trigger point injection for suspected abdominal wall pain.  See last clinic note for full details of her case. She has a history of cholecystectomy.  She has chronic right upper quadrant discomfort that has bothered her.  She is tender to the touch along certain portion of her right upper quadrant.  This is often made worse by positional changes such as lifting, or straining, or lying on it.  She frequently notices it at night when sleeping and when she changes position goes away. She has a (+) Carnett, I discussed abdominal wall pain with her in the past and trigger point injection and she wanted to proceed.    Past Medical History:  Diagnosis Date  . Anemia   . Anticoagulation monitoring by pharmacist   . Arthritis   . Asthma   . Blood transfusion without reported diagnosis   . Cataract   . Cirrhosis ([place])   . COPD (chronic obstructive pulmonary disease) ([place])   . Diabetes mellitus without complication ([place])   . Dyspnea    exertion  . Elevated hemoglobin A1c 03/01/2018  . Family history of Hodgkin's lymphoma   . Family history of leukemia   . Family history of lung cancer   . Family history of stomach cancer   . Family history of uterine cancer   . History of colon polyps   . History of pulmonary embolism 02/04/2013  . Hyperlipidemia   . NASH (nonalcoholic steatohepatitis)   . Neuromuscular disorder ([place])    back surgery  . PE (pulmonary embolism)   . Sleep apnea      Past Surgical History:  Procedure Laterality Date  . ABDOMINAL HYSTERECTOMY    . BREAST BIOPSY    . CATARACT EXTRACTION, BILATERAL Bilateral   . CHOLECYSTECTOMY    . COLONOSCOPY WITH PROPOFOL N/A 05/07/2017   Procedure: COLONOSCOPY WITH PROPOFOL;  Surgeon: Laurence Spates, MD;  Location: [facility];  Service: Endoscopy;  Laterality: N/A;  . ESOPHAGOGASTRODUODENOSCOPY (EGD) WITH PROPOFOL N/A 05/07/2017   Procedure: ESOPHAGOGASTRODUODENOSCOPY (EGD) WITH PROPOFOL;   Surgeon: Laurence Spates, MD;  Location: [facility] END[facility];  Service: Endoscopy;  Laterality: N/A;  . EYE SURGERY     Cataracts  . INCONTINENCE SURGERY  2009  . IR TRANSCATHETER BX  03/15/2018  . IR VENOGRAM HEPATIC W HEMODYNAMIC EVALUATION  03/15/2018  . LEFT AND RIGHT HEART CATHETERIZATION WITH CORONARY ANGIOGRAM N/A 05/13/2014   Procedure: LEFT AND RIGHT HEART CATHETERIZATION WITH CORONARY ANGIOGRAM;  Surgeon: [name], MD;  Location[facility] CATH LAB;  Service: Cardiovascular;  Laterality: N/A;  . SPINE SURGERY     L5 S1 fusion  . TUBAL LIGATION    . WRIST SURGERY Right    Had surgery twice to shave bone for blood circulation improvement.   Family History  Problem Relation Age of Onset  . Cirrhosis Mother        NASH  . Irritable bowel syndrome Mother   . Hyperlipidemia Father   . Heart failure Father   . Bipolar disorder Father   . Heart disease Father   . Obesity Daughter   . Endometrial cancer Daughter 16  . Hypertension Brother   . Colon polyps Sister        unknown number but fewer than patient  . Irritable bowel syndrome Sister   . Heart failure Paternal Grandfather   . Stomach cancer Maternal Uncle        dx 40s/50s  . Cancer Paternal Aunt  NOS  . Cancer Paternal Uncle        NOS  . Lung cancer Maternal Uncle        dx 40s/50s, smoker  . Cancer Maternal Uncle        NOS  . Cancer Paternal Aunt        NOS  . Leukemia Cousin        dx 45s, died 2 weeks after diagnosis (maternal first cousin)  . Cancer Cousin        NOS (paternal first cousin)  . Hodgkin's lymphoma Other 14       4th degree relative (mother's half-sibling's son)  . Heart attack Neg Hx   . Miscarriages / Stillbirths Neg Hx   . Colon cancer Neg Hx   . Esophageal cancer Neg Hx   . Rectal cancer Neg Hx    Social History   Tobacco Use  . Smoking status: Former Smoker    Packs/day: 2.00    Years: 50.00    Pack years: 100.00    Types: Cigarettes    Start date: 08/22/1977    Quit  date: 04/21/2012    Years since quitting: 8.7  . Smokeless tobacco: Never Used  Vaping Use  . Vaping Use: Never used  Substance Use Topics  . Alcohol use: No  . Drug use: No   Current Outpatient Medications  Medication Sig Dispense Refill  . acetaminophen (TYLENOL) 500 MG tablet Take 500 mg by mouth every 6 (six) hours as needed.    Marland Kitchen albuterol (VENTOLIN HFA) 108 (90 Base) MCG/ACT inhaler Inhale 2 puffs into the lungs every 6 (six) hours as needed for wheezing or shortness of breath. 18 g 0  . Blood Glucose Monitoring Suppl (ONETOUCH VERIO REFLECT) w/Device KIT Test BS twice daily Dx e11.9 1 kit 0  . fexofenadine (ALLEGRA) 180 MG tablet Take 1 tablet (180 mg total) by mouth daily. For allergy symptoms 30 tablet 11  . fluticasone (FLONASE) 50 MCG/ACT nasal spray Place 2 sprays into both nostrils daily. 16 g 11  . furosemide (LASIX) 20 MG tablet Take 1 tablet (20 mg total) by mouth as needed. 30 tablet 3  . ipratropium-albuterol (DUONEB) 0.5-2.5 (3) MG/3ML SOLN Take 3 mLs by nebulization every 6 (six) hours as needed. 360 mL 1  . Lancets (ONETOUCH DELICA PLUS [id number]) MISC CHECK BLOOD SUGAR TWICE  DAILY AND AS NEEDED Dx E11.9 200 each 3  . metFORMIN (GLUCOPHAGE-XR) 750 MG 24 hr tablet Take 1 tablet (750 mg total) by mouth daily with breakfast. 90 tablet 3  . montelukast (SINGULAIR) 10 MG tablet Take 1 tablet (10 mg total) by mouth at bedtime. For allergy 30 tablet 3  . ondansetron (ZOFRAN) 4 MG tablet Take 1 tablet (4 mg total) by mouth every 8 (eight) hours as needed for nausea or vomiting. 20 tablet 0  . ONETOUCH VERIO test strip TEST BLOOD SUGAR TWICE  DAILY 200 strip 3  . traZODone (DESYREL) 150 MG tablet Use from 1/3 to 1 tablet nightly as needed for sleep. 90 tablet 1  . triamcinolone cream (KENALOG) 0.1 % Apply 1 application topically 3 (three) times daily. Avoid face and genitalia 45 g 0  . colestipol (COLESTID) 1 g tablet Take 1 tablet (1 g total) by mouth 2 (two) times daily. Titrate  as needed, not to exceed two tablets twice a day (Patient not taking: Reported on 01/11/2021) 60 tablet 3   No current facility-administered medications for this visit.   Allergies  Allergen  Reactions  . Aciphex [Rabeprazole Sodium] Rash  . Fluconazole Rash and Hives  . Lansoprazole Hives  . Septra [Sulfamethoxazole-Trimethoprim] Nausea And Vomiting    "made me bleed"  . Sucralfate Nausea Only  . Vancomycin Diarrhea  . Claritin [Loratadine] Other (See Comments)    Tired   . Crestor [Rosuvastatin] Other (See Comments)    Elevated blood glucose and abdominal pain  . Doxycycline Rash  . Ibuprofen Other (See Comments)    Upset stomach  . Lipitor [Atorvastatin] Other (See Comments)    Myalgias, leg pain.  Problems with pancreas, sugars went up also.  Santiago Bur [Nitrofurantoin Macrocrystal] Nausea And Vomiting  . Metoprolol Nausea And Vomiting  . Metronidazole Other (See Comments) and Nausea Only    Dizziness, nausea, dry mouth and some shortness of breath  . Nsaids Other (See Comments)    Upset stomach  . Omeprazole     bloating  . Rabeprazole Rash     Review of Systems: All systems reviewed and negative except where noted in HPI.   Lab Results  Component Value Date   WBC 4.5 11/17/2020   HGB 13.7 11/17/2020   HCT 40.5 11/17/2020   MCV 85 11/17/2020   PLT 84 (LL) 11/17/2020   Lab Results  Component Value Date   INR 1.3 (H) 12/29/2020   INR 1.3 (H) 06/17/2020   INR 1.18 03/15/2018    Lab Results  Component Value Date   CREATININE 0.65 11/17/2020   BUN 8 11/17/2020   NA 143 11/17/2020   K 4.0 11/17/2020   CL 105 11/17/2020   CO2 21 11/17/2020    Lab Results  Component Value Date   ALT 25 11/17/2020   AST 37 11/17/2020   ALKPHOS 89 11/17/2020   BILITOT 2.2 (H) 11/17/2020     Physical Exam: BP 118/60 (BP Location: Left Arm, Patient Position: Sitting, Cuff Size: Normal)   Pulse 84   Ht 5' 4" (1.626 m) Comment: height measured without shoes  Wt 206 lb  4 oz (93.6 kg)   BMI 35.40 kg/m  Constitutional: Pleasant,well-developed, female in no acute distress. Abdominal: Soft, nondistended, focal tenderness along scar RUQ, (+) [place]. There are no masses palpable. No hepatomegaly. Extremities: no edema Lymphadenopathy: No cervical adenopathy noted. Neurological: Alert and oriented to person place and time. Skin: Skin is warm and dry. No rashes noted. Psychiatric: Normal mood and affect. Behavior is normal.   IMPRESSION and PLAN:    #1.  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  All risks, benefits were described and informed consent was obtained.  -site prepped in the usual sterile fashion.  Site of pain was reconfirmed by manual palpation. -Lidocaine 1%.  Injected 5 cc into the site of pain, with resolution of pain upon repeat palpation.   -Kenalog 40 mg.  Injected directly into the site of pain for therapeutic intervention of the above confirmed Abdominal Wall Syndrome. -Band-Aid applied -The patient was observed in the clinic after the procedure. No complications were encountered and the patient tolerated the procedure well.  Hopefully this provides resolution however if the pain does recur, can follow-up with me for possible repeat injection in 4+ weeks.  Otherwise, to follow-up as needed.   [place] Cellar, MD [facility] 2 Gastroenterology

## 2021-01-18 ENCOUNTER — Ambulatory Visit (INDEPENDENT_AMBULATORY_CARE_PROVIDER_SITE_OTHER): Payer: Medicare Other | Admitting: Nurse Practitioner

## 2021-01-18 ENCOUNTER — Other Ambulatory Visit (HOSPITAL_COMMUNITY): Payer: Self-pay

## 2021-01-18 DIAGNOSIS — R0981 Nasal congestion: Secondary | ICD-10-CM | POA: Diagnosis not present

## 2021-01-18 DIAGNOSIS — R059 Cough, unspecified: Secondary | ICD-10-CM | POA: Diagnosis not present

## 2021-01-18 DIAGNOSIS — U071 COVID-19: Secondary | ICD-10-CM

## 2021-01-18 MED ORDER — NIRMATRELVIR/RITONAVIR (PAXLOVID)TABLET
3.0000 | ORAL_TABLET | Freq: Two times a day (BID) | ORAL | 0 refills | Status: AC
  Filled 2021-01-18: qty 30, 5d supply, fill #0

## 2021-01-18 NOTE — Progress Notes (Signed)
Virtual Visit  Note Due to COVID-19 pandemic this visit was conducted virtually. This visit type was conducted due to national recommendations for restrictions regarding the COVID-19 Pandemic (e.g. social distancing, sheltering in place) in an effort to limit this patient's exposure and mitigate transmission in our community. All issues noted in this document were discussed and addressed.  A physical exam was not performed with this format.  I connected with Kimberly Obrien on 01/18/21 at 4:07 by telephone and verified that I am speaking with the correct person using two identifiers. Kimberly Obrien is currently located at home and no one is currently with her during visit. The provider, Mary-Margaret Hassell Done, FNP is located in their office at time of visit.  I discussed the limitations, risks, security and privacy concerns of performing an evaluation and management service by telephone and the availability of in person appointments. I also discussed with the patient that there may be a patient responsible charge related to this service. The patient expressed understanding and agreed to proceed.   History and Present Illness:   Chief Complaint: Covid Positive   HPI Patient c/o nasal congestion , cough and body aches. Headache developed Sunday with chills , fever and sore throat. She did home covid test yesterday and was positive. She has not had vaccine and has not had covid in the past.   Review of Systems  Constitutional: Positive for chills and fever.  HENT: Positive for congestion, ear pain and sore throat. Negative for sinus pain.   Respiratory: Positive for cough. Negative for sputum production.   Musculoskeletal: Positive for myalgias.  Neurological: Positive for dizziness and headaches.     Observations/Objective: Alert and oriented- answers all questions appropriately No distress Hoarse Deep cough noted   Assessment and Plan: Kimberly Obrien in today with  chief complaint of Covid Positive   1. Lab test positive for detection of COVID-19 virus 2. Cough 3. Congestion of nasal sinus 1. Take meds as prescribed 2. Use a cool mist humidifier especially during the winter months and when heat has been humid. 3. Use saline nose sprays frequently 4. Saline irrigations of the nose can be very helpful if done frequently.  * 4X daily for 1 week*  * Use of a nettie pot can be helpful with this. Follow directions with this* 5. Drink plenty of fluids 6. Keep thermostat turn down low 7.For any cough or congestion  Use plain Mucinex- regular strength or max strength is fine   * Children- consult with Pharmacist for dosing 8. For fever or aces or pains- take tylenol or ibuprofen appropriate for age and weight.  * for fevers greater than 101 orally you may alternate ibuprofen and tylenol every  3 hours.   Meds ordered this encounter  Medications  . nirmatrelvir/ritonavir EUA (PAXLOVID) TABS    Sig: Take 3 tablets by mouth 2 (two) times daily for 5 days. Patient GFR is 101. Take nirmatrelvir (150 mg) two tablets twice daily for 5 days and ritonavir (100 mg) one tablet twice daily for 5 days.    Dispense:  30 tablet    Refill:  0    Order Specific Question:   Supervising Provider    Answer:   Caryl Pina A [[id number]]         Follow Up Instructions: prn    I discussed the assessment and treatment plan with the patient. The patient was provided an opportunity to ask questions and all were answered. The patient agreed  with the plan and demonstrated an understanding of the instructions.   The patient was advised to call back or seek an in-person evaluation if the symptoms worsen or if the condition fails to improve as anticipated.  The above assessment and management plan was discussed with the patient. The patient verbalized understanding of and has agreed to the management plan. Patient is aware to call the clinic if symptoms persist or  worsen. Patient is aware when to return to the clinic for a follow-up visit. Patient educated on when it is appropriate to go to the emergency department.   Time call ended:  4:20  I provided 12 minutes of  non face-to-face time during this encounter.    Mary-Margaret Hassell Done, FNP

## 2021-01-19 ENCOUNTER — Telehealth: Payer: Self-pay | Admitting: Gastroenterology

## 2021-01-19 NOTE — Telephone Encounter (Signed)
Thanks Jan. As she has compensated cirrhosis, okay to use Paxlovid, no change in dosing. Sorry to hear about her abdominal pain. She had pain in multiple areas, the trigger point injection was specifically was to address the RUQ pain, not sure if that has helped in that area at all? Thanks

## 2021-01-19 NOTE — Telephone Encounter (Signed)
Inbound call from patient and she has a question regarding her paxlovid. Please advise. Thank you

## 2021-01-19 NOTE — Telephone Encounter (Signed)
Called and spoke to patient and let her know she can start medication.  Patient indicates the pain in the area where she had the trigger point injection has not improved. The numbness wore off within a day and she felt the pain again. She developed Covid and has been coughing a lot which certainly "didn't help matters".

## 2021-01-19 NOTE — Telephone Encounter (Signed)
Okay thanks for the update. Will see how she does with a bit more time. She did have relief of pain with the lidocaine which argues for nerve impingement. We can consider another treatment based on her course but would give her more time to see if it improves first. Thanks

## 2021-01-19 NOTE — Telephone Encounter (Signed)
Patient states she tested positive for Covid on Monday.  She was given Paxlovid to be taken BID for 5 day and wants to know if she can take it since she has cirrhosis.  She would appreciate a call back today so she knows if she can start the medication.  Of note: Patient states that the Trigger Point Injection did not alleviate her abdominal pain.

## 2021-01-27 ENCOUNTER — Encounter: Payer: Self-pay | Admitting: Nurse Practitioner

## 2021-01-27 ENCOUNTER — Ambulatory Visit (INDEPENDENT_AMBULATORY_CARE_PROVIDER_SITE_OTHER): Payer: Medicare Other | Admitting: Nurse Practitioner

## 2021-01-27 DIAGNOSIS — R059 Cough, unspecified: Secondary | ICD-10-CM | POA: Diagnosis not present

## 2021-01-27 MED ORDER — PREDNISONE 20 MG PO TABS: 40.0000 mg | ORAL_TABLET | Freq: Every day | ORAL | 0 refills | Status: AC

## 2021-01-27 MED ORDER — AZITHROMYCIN 250 MG PO TABS: ORAL_TABLET | ORAL | 0 refills | Status: DC

## 2021-01-27 MED ORDER — HYDROCODONE BIT-HOMATROP MBR 5-1.5 MG/5ML PO SOLN: 5.0000 mL | Freq: Four times a day (QID) | ORAL | 0 refills | Status: DC | PRN

## 2021-01-27 NOTE — Progress Notes (Signed)
Virtual Visit  Note Due to COVID-19 pandemic this visit was conducted virtually. This visit type was conducted due to national recommendations for restrictions regarding the COVID-19 Pandemic (e.g. social distancing, sheltering in place) in an effort to limit this patient's exposure and mitigate transmission in our community. All issues noted in this document were discussed and addressed.  A physical exam was not performed with this format.  I connected with Kimberly Obrien on 01/27/21 at 9:30 by telephone and verified that I am speaking with the correct person using two identifiers. Kimberly Obrien is currently located at home and no one is currently with her during visit. The provider, Mary-Margaret Hassell Done, FNP is located in their office at time of visit.  I discussed the limitations, risks, security and privacy concerns of performing an evaluation and management service by telephone and the availability of in person appointments. I also discussed with the patient that there may be a patient responsible charge related to this service. The patient expressed understanding and agreed to proceed.   History and Present Illness:  Chief Complaint: Cough   HPI Patient had telephone visit on 01/18/21 with positive covid test. She was given paxlovid. She is doing much better but still has a deep productive cough. She has not been taking any cough medicine. Cough is constant and is worse when she lays down.    Review of Systems  Constitutional:  Negative for chills and fever.  HENT:  Negative for congestion, sinus pain and sore throat.   Respiratory:  Positive for cough and sputum production. Negative for shortness of breath.   Musculoskeletal:  Negative for myalgias.  Neurological:  Negative for dizziness and headaches.  All other systems reviewed and are negative.   Observations/Objective: Alert and oriented- answers all questions appropriately No distress Deep cough noted Voice  hoarse.  Assessment and Plan: Kimberly Obrien in today with chief complaint of Cough   1. Cough 1. Take meds as prescribed 2. Use a cool mist humidifier especially during the winter months and when heat has been humid. 3. Use saline nose sprays frequently 4. Saline irrigations of the nose can be very helpful if done frequently.  * 4X daily for 1 week*  * Use of a nettie pot can be helpful with this. Follow directions with this* 5. Drink plenty of fluids 6. Keep thermostat turn down low 7.For any cough or congestion 8. For fever or aces or pains- take tylenol or ibuprofen appropriate for age and weight.  * for fevers greater than 101 orally you may alternate ibuprofen and tylenol every  3 hours.    - azithromycin (ZITHROMAX Z-PAK) 250 MG tablet; As directed  Dispense: 6 tablet; Refill: 0 - HYDROcodone bit-homatropine (HYCODAN) 5-1.5 MG/5ML syrup; Take 5 mLs by mouth every 6 (six) hours as needed for cough.  Dispense: 120 mL; Refill: 0 - predniSONE (DELTASONE) 20 MG tablet; Take 2 tablets (40 mg total) by mouth daily with breakfast for 5 days. 2 po daily for 5 days  Dispense: 10 tablet; Refill: 0    The above assessment and management plan was discussed with the patient. The patient verbalized understanding of and has agreed to the management plan. Patient is aware to call the clinic if symptoms persist or worsen. Patient is aware when to return to the clinic for a follow-up visit. Patient educated on when it is appropriate to go to the emergency department.   [place], FNP  1. Take meds as prescribed 2.  Use a cool mist humidifier especially during the winter months and when heat has been humid. 3. Use saline nose sprays frequently 4. Saline irrigations of the nose can be very helpful if done frequently.  * 4X daily for 1 week*  * Use of a nettie pot can be helpful with this. Follow directions with this* 5. Drink plenty of fluids 6. Keep thermostat turn down  low 7.For any cough or congestion- meds prescribed 8. For fever or aces or pains- take tylenol or ibuprofen appropriate for age and weight.  * for fevers greater than 101 orally you may alternate ibuprofen and tylenol every  3 hours.     Follow Up Instructions: prn    I discussed the assessment and treatment plan with the patient. The patient was provided an opportunity to ask questions and all were answered. The patient agreed with the plan and demonstrated an understanding of the instructions.   The patient was advised to call back or seek an in-person evaluation if the symptoms worsen or if the condition fails to improve as anticipated.  The above assessment and management plan was discussed with the patient. The patient verbalized understanding of and has agreed to the management plan. Patient is aware to call the clinic if symptoms persist or worsen. Patient is aware when to return to the clinic for a follow-up visit. Patient educated on when it is appropriate to go to the emergency department.   Time call ended:  9:42  I provided 12 minutes of  non face-to-face time during this encounter.    Mary-Margaret Hassell Done, FNP

## 2021-04-01 ENCOUNTER — Telehealth: Payer: Self-pay | Admitting: Hematology

## 2021-04-01 NOTE — Telephone Encounter (Signed)
Rescheduled upcoming appointment due to provider's emergency. Patient is aware of changes.

## 2021-04-05 ENCOUNTER — Inpatient Hospital Stay: Payer: Medicare Other | Admitting: Hematology

## 2021-04-05 ENCOUNTER — Inpatient Hospital Stay: Payer: Medicare Other

## 2021-04-13 ENCOUNTER — Telehealth: Payer: Self-pay | Admitting: Gastroenterology

## 2021-04-13 ENCOUNTER — Other Ambulatory Visit: Payer: Self-pay | Admitting: *Deleted

## 2021-04-13 ENCOUNTER — Inpatient Hospital Stay: Payer: Medicare Other | Attending: Hematology

## 2021-04-13 ENCOUNTER — Other Ambulatory Visit: Payer: Self-pay

## 2021-04-13 ENCOUNTER — Inpatient Hospital Stay: Payer: Medicare Other | Admitting: Hematology

## 2021-04-13 VITALS — BP 140/51 | HR 78 | Temp 98.0°F | Resp 17 | Ht 64.0 in | Wt 208.5 lb

## 2021-04-13 DIAGNOSIS — D500 Iron deficiency anemia secondary to blood loss (chronic): Secondary | ICD-10-CM

## 2021-04-13 DIAGNOSIS — D696 Thrombocytopenia, unspecified: Secondary | ICD-10-CM

## 2021-04-13 DIAGNOSIS — K922 Gastrointestinal hemorrhage, unspecified: Secondary | ICD-10-CM | POA: Diagnosis not present

## 2021-04-13 DIAGNOSIS — D693 Immune thrombocytopenic purpura: Secondary | ICD-10-CM | POA: Diagnosis not present

## 2021-04-13 DIAGNOSIS — K7460 Unspecified cirrhosis of liver: Secondary | ICD-10-CM

## 2021-04-13 DIAGNOSIS — Z86711 Personal history of pulmonary embolism: Secondary | ICD-10-CM | POA: Insufficient documentation

## 2021-04-13 LAB — CBC WITH DIFFERENTIAL (CANCER CENTER ONLY)
Abs Immature Granulocytes: 0.02 10*3/uL (ref 0.00–0.07)
Basophils Absolute: 0 10*3/uL (ref 0.0–0.1)
Basophils Relative: 1 %
Eosinophils Absolute: 0.1 10*3/uL (ref 0.0–0.5)
Eosinophils Relative: 1 %
HCT: 40 % (ref 36.0–46.0)
Hemoglobin: 13.7 g/dL (ref 12.0–15.0)
Immature Granulocytes: 0 %
Lymphocytes Relative: 17 %
Lymphs Abs: 0.9 10*3/uL (ref 0.7–4.0)
MCH: 29.3 pg (ref 26.0–34.0)
MCHC: 34.3 g/dL (ref 30.0–36.0)
MCV: 85.5 fL (ref 80.0–100.0)
Monocytes Absolute: 0.2 10*3/uL (ref 0.1–1.0)
Monocytes Relative: 4 %
Neutro Abs: 4 10*3/uL (ref 1.7–7.7)
Neutrophils Relative %: 77 %
Platelet Count: 80 10*3/uL — ABNORMAL LOW (ref 150–400)
RBC: 4.68 MIL/uL (ref 3.87–5.11)
RDW: 14 % (ref 11.5–15.5)
WBC Count: 5.2 10*3/uL (ref 4.0–10.5)
nRBC: 0 % (ref 0.0–0.2)

## 2021-04-13 LAB — CMP (CANCER CENTER ONLY)
ALT: 26 U/L (ref 0–44)
AST: 31 U/L (ref 15–41)
Albumin: 3.5 g/dL (ref 3.5–5.0)
Alkaline Phosphatase: 73 U/L (ref 38–126)
Anion gap: 10 (ref 5–15)
BUN: 6 mg/dL (ref 6–20)
CO2: 26 mmol/L (ref 22–32)
Calcium: 8.8 mg/dL — ABNORMAL LOW (ref 8.9–10.3)
Chloride: 107 mmol/L (ref 98–111)
Creatinine: 0.64 mg/dL (ref 0.44–1.00)
GFR, Estimated: >60 mL/min (ref >60)
Glucose, Bld: 134 mg/dL — ABNORMAL HIGH (ref 70–99)
Potassium: 3.5 mmol/L (ref 3.5–5.1)
Sodium: 143 mmol/L (ref 135–145)
Total Bilirubin: 3.1 mg/dL — ABNORMAL HIGH (ref 0.3–1.2)
Total Protein: 6.2 g/dL — ABNORMAL LOW (ref 6.5–8.1)

## 2021-04-13 LAB — IRON AND TIBC
Iron: 113 ug/dL (ref 41–142)
Saturation Ratios: 26 % (ref 21–57)
TIBC: 425 ug/dL (ref 236–444)
UIBC: 313 ug/dL (ref 120–384)

## 2021-04-13 LAB — FERRITIN: Ferritin: 23 ng/mL (ref 11–307)

## 2021-04-13 LAB — SAMPLE TO BLOOD BANK

## 2021-04-13 NOTE — Telephone Encounter (Signed)
Pt needs to schedule [place] at [facility]. She stated that she is due for one in September.

## 2021-04-14 NOTE — Telephone Encounter (Signed)
Orders entered for routine [place] Pt is aware that radiology schedulers will contact her directly to set up her appt, advised patient that she will be due closer to the end of September. I have provided patient with the radiology scheduling phone number as well. Pt verbalized understanding and had no concerns at the end of the call.   Secure staff message sent to radiology schedulers to set up ultrasound appt.

## 2021-04-19 ENCOUNTER — Encounter: Payer: Self-pay | Admitting: Hematology

## 2021-04-19 NOTE — Progress Notes (Signed)
HEMATOLOGY CLINIC NOTE Patient Care Team: Claretta Fraise, MD as PCP - General (Family Medicine) [name], MD as PCP - Cardiology (Cardiology) Sinda Du, MD as Consulting Physician (Pulmonary Disease) Teena Irani, MD (Inactive) as Consulting Physician (Gastroenterology) [name], MD as Consulting Physician (Cardiology) Ilean China, RN as Case Manager    CHIEF COMPLAINTS/PURPOSE OF CONSULTATION:  F/u for IDA  HISTORY OF PRESENTING ILLNESS:   Kimberly Obrien 60 y.o. female is here because of a referral from Dr. 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   The pt reports that she is doing well overall. She reports seeing a Hematologist first after having a PE on her right side, damaging the lower lobe in 1999. She reports having a second in 2003 with 3 small PE in her right upper lobe. She reports having been on estrogen containing birth control in 1999 which she was told likely led to her PE, and was smoking at the time. She reports taking a diet pill with hormones in 2003 when she had her second PE. She denies having genetic testing regarding these Pes. She reports taking Warfarin 6 months after the first PE. She reports having taken Coumadin from 2003 until 3 months ago when she stopped due to her GI bleeding.  She denies having had any other blood clots, through 4 pregnancies and a few surgeries.  She denies a FHx of blood clotting or bleeding problems. She reports being SOB when she is losing lots of blood. She reports having a blood transfusion on 05/07/17, and receiving two iron infusions in the last 4 months. She denies allergic reactions to IV iron, but does note stomach issues and drowsiness for a couple days following IV iron.   Dr. Oletta Lamas has been following her iron levels. She reports using an inhaler.  She reports that Dr. 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   Of note prior to the patient's visit, pt has had an Abdomen CT completed on 04/19/17 with results revealing Mild splenomegaly.  On 05/07/17 she had a colonoscopy with 4 polyps removed, and non-bleeding internal hemorrhoids and upper endoscopy (05/07/17) with Gastritis. Biopsied. Normal examined duodenum. Blood found in stool.  Most recent lab results (08/30/17) of CBC, CMP is as follows: all values are WNL except for Glucose at 128, Total bilirubin at 1.4, AST at 68, ALT at 64, MCH at 26.4, Plt at 136k, Neut % at 75.5%.   On review of systems, pt reports occasional SOB, blood in the stools, abdominal pains, and denies mouth ulcers, and any other symptoms.   On PMHx the pt reports IBS, Positive hemoccult card, RRR with murmur, abdominal pain, respiratory disease, migraines, vertigo, COPD, arthritis, DJD, abnormal heart rhythm, fatty liver, pulmonary embolism, pulmonary edema, tachycardia, Vit D deficiency, arthralgia, insomnia, fatigue, hyperlipidemia, anxiety, dysuria, and sleep apnea.  On Surgical Hx the pt reports back surgery x4 (1997, 2006, 2006, 2007), tubal ligation (1985), wrist surgery x2 (1995 x2), cholecystectomy (2001), colonoscopy x2 (2007, 2014), EGD x2 (2002, 2017), hysterectomy partial (2009), rt eye cataract (2013), and cardiac catheterization (2015). On Social Hx the pt reports quitting smoking in 04/18/12 after 30 years, no ETOH use, and being occupationally disabled.    INTERVAL HISTORY   Kimberly Obrien is here for management and evaluation of her iron deficiency anemia and thrombocytopenia. The patient's last visit with [place] was on  10/05/2020. The pt reports that she is doing well overall.  The pt reports that she has had black/bloody stools  on and off.  No evidence of other sites of bleeding.  Prescription refill for iron with gastroenterology She has not tolerated IV iron due to significant  reaction with fever and rash in the past.  Lab results today .04/13/2021 CBC showed normal hemoglobin of 13.7 normal WBC count platelets are stable at 80k CMP stable bilirubin somewhat higher at 3.1.   On review of systems, pt reports intermittent black/bloody stools, fatigue and denies abdominal pain, SOB, fevers, chills, leg swelling, back pain and any other symptoms.  MEDICAL HISTORY:  Past Medical History:  Diagnosis Date   Anemia    Anticoagulation monitoring by pharmacist    Arthritis    Asthma    Blood transfusion without reported diagnosis    Cataract    Cirrhosis ([place])    COPD (chronic obstructive pulmonary disease) ([place])    Diabetes mellitus without complication (HCC)    Dyspnea    exertion   Elevated hemoglobin A1c 03/01/2018   Family history of Hodgkin's lymphoma    Family history of leukemia    Family history of lung cancer    Family history of stomach cancer    Family history of uterine cancer    History of colon polyps    History of pulmonary embolism 02/04/2013   Hyperlipidemia    NASH (nonalcoholic steatohepatitis)    Neuromuscular disorder ([place])    back surgery   PE (pulmonary embolism)    Sleep apnea     SURGICAL HISTORY: Past Surgical History:  Procedure Laterality Date   ABDOMINAL HYSTERECTOMY     BREAST BIOPSY     CATARACT EXTRACTION, BILATERAL Bilateral    CHOLECYSTECTOMY     COLONOSCOPY WITH PROPOFOL N/A 05/07/2017   Procedure: COLONOSCOPY WITH PROPOFOL;  Surgeon: Laurence Spates, MD;  Location: [facility];  Service: Endoscopy;  Laterality: N/A;   ESOPHAGOGASTRODUODENOSCOPY (EGD) WITH PROPOFOL N/A 05/07/2017   Procedure: ESOPHAGOGASTRODUODENOSCOPY (EGD) WITH PROPOFOL;  Surgeon: Laurence Spates, MD;  Location: [facility];  Service: Endoscopy;  Laterality: N/A;   EYE SURGERY     Cataracts   INCONTINENCE SURGERY  2009   IR TRANSCATHETER BX  03/15/2018   IR VENOGRAM HEPATIC W HEMODYNAMIC EVALUATION  03/15/2018   LEFT AND RIGHT HEART  CATHETERIZATION WITH CORONARY ANGIOGRAM N/A 05/13/2014   Procedure: LEFT AND RIGHT HEART CATHETERIZATION WITH CORONARY ANGIOGRAM;  Surgeon: [name], MD;  Location: [facility] CATH LAB;  Service: Cardiovascular;  Laterality: N/A;   SPINE SURGERY     L5 S1 fusion   TUBAL LIGATION     WRIST SURGERY Right    Had surgery twice to shave bone for blood circulation improvement.    SOCIAL HISTORY: Social History   Socioeconomic History   Marital status: Married    Spouse name: Shanon Brow   Number of children: 4   Years of education: GED   Highest education level: GED or equivalent  Occupational History   Occupation: Unemployed  Tobacco Use   Smoking status: Former    Packs/day: 2.00    Years: 50.00    Pack years: 100.00    Types: Cigarettes    Start date: 08/22/1977    Quit date: 04/21/2012    Years since quitting: 9.0   Smokeless tobacco: Never  Vaping Use   Vaping Use: Never used  Substance and Sexual Activity   Alcohol use: No   Drug use: No  Sexual activity: Not Currently    Birth control/protection: Surgical  Other Topics Concern   Not on file  Social History Narrative   Not on file   Social Determinants of Health   Financial Resource Strain: Low Risk    Difficulty of Paying Living Expenses: Not hard at all  Food Insecurity: No Food Insecurity   Worried About Running Out of Food in the Last Year: Never true   [place] in the Last Year: Never true  Transportation Needs: No Transportation Needs   Lack of Transportation (Medical): No   Lack of Transportation (Non-Medical): No  Physical Activity: Inactive   Days of Exercise per Week: 0 days   Minutes of Exercise per Session: 0 min  Stress: Stress Concern Present   Feeling of Stress : To some extent  Social Connections: Moderately Isolated   Frequency of Communication with Friends and Family: More than three times a week   Frequency of Social Gatherings with Friends and Family: More than three times a week    Attends Religious Services: Never   Marine scientist or Organizations: No   Attends Music therapist: Never   Marital Status: Married  Human resources officer Violence: Not At Risk   Fear of Current or Ex-Partner: No   Emotionally Abused: No   Physically Abused: No   Sexually Abused: No    FAMILY HISTORY: Family History  Problem Relation Age of Onset   Cirrhosis Mother        NASH   Irritable bowel syndrome Mother    Hyperlipidemia Father    Heart failure Father    Bipolar disorder Father    Heart disease Father    Obesity Daughter    Endometrial cancer Daughter 24   Hypertension Brother    Colon polyps Sister        unknown number but fewer than patient   Irritable bowel syndrome Sister    Heart failure Paternal Grandfather    Stomach cancer Maternal Uncle        dx 40s/50s   Cancer Paternal Aunt        NOS   Cancer Paternal Uncle        NOS   Lung cancer Maternal Uncle        dx 40s/50s, smoker   Cancer Maternal Uncle        NOS   Cancer Paternal Aunt        NOS   Leukemia Cousin        dx 90s, died 2 weeks after diagnosis (maternal first cousin)   Cancer Cousin        NOS (paternal first cousin)   Hodgkin's lymphoma Other 69       4th degree relative (mother's half-sibling's son)   Heart attack Neg Hx    Miscarriages / Stillbirths Neg Hx    Colon cancer Neg Hx    Esophageal cancer Neg Hx    Rectal cancer Neg Hx     ALLERGIES:  is allergic to aciphex [rabeprazole sodium], fluconazole, lansoprazole, septra [sulfamethoxazole-trimethoprim], sucralfate, vancomycin, claritin [loratadine], crestor [rosuvastatin], doxycycline, ibuprofen, lipitor [atorvastatin], macrobid [nitrofurantoin macrocrystal], metoprolol, metronidazole, nsaids, omeprazole, and rabeprazole.  MEDICATIONS:  Current Outpatient Medications  Medication Sig Dispense Refill   acetaminophen (TYLENOL) 500 MG tablet Take 500 mg by mouth every 6 (six) hours as needed.     albuterol  (VENTOLIN HFA) 108 (90 Base) MCG/ACT inhaler Inhale 2 puffs into the lungs every 6 (six) hours as needed  for wheezing or shortness of breath. 18 g 0   Blood Glucose Monitoring Suppl (ONETOUCH VERIO REFLECT) w/Device KIT Test BS twice daily Dx e11.9 1 kit 0   fexofenadine (ALLEGRA) 180 MG tablet Take 1 tablet (180 mg total) by mouth daily. For allergy symptoms 30 tablet 11   fluticasone (FLONASE) 50 MCG/ACT nasal spray Place 2 sprays into both nostrils daily. 16 g 11   furosemide (LASIX) 20 MG tablet Take 1 tablet (20 mg total) by mouth as needed. 30 tablet 3   ipratropium-albuterol (DUONEB) 0.5-2.5 (3) MG/3ML SOLN Take 3 mLs by nebulization every 6 (six) hours as needed. 360 mL 1   Lancets (ONETOUCH DELICA PLUS OQHUTM54Y) MISC CHECK BLOOD SUGAR TWICE  DAILY AND AS NEEDED Dx E11.9 200 each 3   metFORMIN (GLUCOPHAGE-XR) 750 MG 24 hr tablet Take 1 tablet (750 mg total) by mouth daily with breakfast. 90 tablet 3   ONETOUCH VERIO test strip TEST BLOOD SUGAR TWICE  DAILY 200 strip 3   triamcinolone cream (KENALOG) 0.1 % Apply 1 application topically 3 (three) times daily. Avoid face and genitalia 45 g 0   azithromycin (ZITHROMAX Z-PAK) 250 MG tablet As directed (Patient not taking: Reported on 04/13/2021) 6 tablet 0   colestipol (COLESTID) 1 g tablet Take 1 tablet (1 g total) by mouth 2 (two) times daily. Titrate as needed, not to exceed two tablets twice a day (Patient not taking: No sig reported) 60 tablet 3   HYDROcodone bit-homatropine (HYCODAN) 5-1.5 MG/5ML syrup Take 5 mLs by mouth every 6 (six) hours as needed for cough. (Patient not taking: Reported on 04/13/2021) 120 mL 0   montelukast (SINGULAIR) 10 MG tablet Take 1 tablet (10 mg total) by mouth at bedtime. For allergy (Patient not taking: Reported on 04/13/2021) 30 tablet 3   ondansetron (ZOFRAN) 4 MG tablet Take 1 tablet (4 mg total) by mouth every 8 (eight) hours as needed for nausea or vomiting. (Patient not taking: Reported on 04/13/2021) 20  tablet 0   traZODone (DESYREL) 150 MG tablet Use from 1/3 to 1 tablet nightly as needed for sleep. (Patient not taking: Reported on 04/13/2021) 90 tablet 1   No current facility-administered medications for this visit.    REVIEW OF SYSTEMS .10 Point review of Systems was done is negative except as noted above. PHYSICAL EXAMINATION:  Vitals:   04/13/21 1122  BP: (!) 140/51  Pulse: 78  Resp: 17  Temp: 98 F (36.7 C)  SpO2: 96%   Filed Weights   04/13/21 1122  Weight: 208 lb 8 oz (94.6 kg)    . GENERAL:alert, in no acute distress and comfortable SKIN: no acute rashes, no significant lesions EYES: conjunctiva are pink and non-injected, sclera anicteric OROPHARYNX: MMM, no exudates, no oropharyngeal erythema or ulceration NECK: supple, no JVD LYMPH:  no palpable lymphadenopathy in the cervical, axillary or inguinal regions LUNGS: clear to auscultation b/l with normal respiratory effort HEART: regular rate & rhythm ABDOMEN:  normoactive bowel sounds , non tender, not distended. Extremity: no pedal edema PSYCH: alert & oriented x 3 with fluent speech NEURO: no focal motor/sensory deficits   LABORATORY DATA:  I have reviewed the data as listed Recent Results (from the past 2160 hour(s))  Sample to Blood Bank     Status: None   Collection Time: 04/13/21 11:04 AM  Result Value Ref Range   Blood Bank Specimen SAMPLE AVAILABLE FOR TESTING    Sample Expiration      04/16/2021,2359 Performed at [facility]  [place] [address] [place], [place] [address]   Ferritin     Status: None   Collection Time: 04/13/21 11:05 AM  Result Value Ref Range   Ferritin 23 11 - 307 ng/mL    Comment: Performed at [facility], [place] [address], [place], [place] [address]  Iron and TIBC     Status: None   Collection Time: 04/13/21 11:05 AM  Result Value Ref Range   Iron 113 41 - 142 ug/dL   TIBC 425 236 - 444 ug/dL   Saturation Ratios 26 21 - 57 %   UIBC  313 120 - 384 ug/dL    Comment: Performed at [facility], [place] [address], [place], [place] [address]  CMP ([place] only)     Status: Abnormal   Collection Time: 04/13/21 11:05 AM  Result Value Ref Range   Sodium 143 135 - 145 mmol/L   Potassium 3.5 3.5 - 5.1 mmol/L   Chloride 107 98 - 111 mmol/L   CO2 26 22 - 32 mmol/L   Glucose, Bld 134 (H) 70 - 99 mg/dL    Comment: Glucose reference range applies only to samples taken after fasting for at least 8 hours.   BUN 6 6 - 20 mg/dL   Creatinine 0.64 0.44 - 1.00 mg/dL   Calcium 8.8 (L) 8.9 - 10.3 mg/dL   Total Protein 6.2 (L) 6.5 - 8.1 g/dL   Albumin 3.5 3.5 - 5.0 g/dL   AST 31 15 - 41 U/L   ALT 26 0 - 44 U/L   Alkaline Phosphatase 73 38 - 126 U/L   Total Bilirubin 3.1 (H) 0.3 - 1.2 mg/dL   GFR, Estimated >60 >60 mL/min    Comment: (NOTE) Calculated using the CKD-EPI Creatinine Equation (2021)    Anion gap 10 5 - 15    Comment: Performed at [facility], [place] [address] [place], [place] [address]  CBC with Differential ([place] Only)     Status: Abnormal   Collection Time: 04/13/21 11:05 AM  Result Value Ref Range   WBC Count 5.2 4.0 - 10.5 K/uL   RBC 4.68 3.87 - 5.11 MIL/uL   Hemoglobin 13.7 12.0 - 15.0 g/dL   HCT 40.0 36.0 - 46.0 %   MCV 85.5 80.0 - 100.0 fL   MCH 29.3 26.0 - 34.0 pg   MCHC 34.3 30.0 - 36.0 g/dL   RDW 14.0 11.5 - 15.5 %   Platelet Count 80 (L) 150 - 400 K/uL   nRBC 0.0 0.0 - 0.2 %   Neutrophils Relative % 77 %   Neutro Abs 4.0 1.7 - 7.7 K/uL   Lymphocytes Relative 17 %   Lymphs Abs 0.9 0.7 - 4.0 K/uL   Monocytes Relative 4 %   Monocytes Absolute 0.2 0.1 - 1.0 K/uL   Eosinophils Relative 1 %   Eosinophils Absolute 0.1 0.0 - 0.5 K/uL   Basophils Relative 1 %   Basophils Absolute 0.0 0.0 - 0.1 K/uL   Immature Granulocytes 0 %   Abs Immature Granulocytes 0.02 0.00 - 0.07 K/uL    Comment: Performed at [facility], [place]  [address] [place], [place] [address]   . Lab Results  Component Value Date   IRON 113 04/13/2021   TIBC 425 04/13/2021   IRONPCTSAT 26 04/13/2021   (Iron and TIBC)  Lab Results  Component Value Date   FERRITIN 23 04/13/2021   .  CBC Latest Ref Rng & Units 04/13/2021 11/17/2020 10/05/2020  WBC 4.0 - 10.5 K/uL 5.2 4.5 5.0  Hemoglobin 12.0 - 15.0 g/dL 13.7 13.7 13.8  Hematocrit 36.0 - 46.0 % 40.0 40.5 41.0  Platelets 150 - 400 K/uL 80(L) 84(LL) 79(L)   . CMP Latest Ref Rng & Units 04/13/2021 11/17/2020 10/05/2020  Glucose 70 - 99 mg/dL 134(H) 208(H) 133(H)  BUN 6 - 20 mg/dL 6 8 9   Creatinine 0.44 - 1.00 mg/dL 0.64 0.65 0.65  Sodium 135 - 145 mmol/L 143 143 143  Potassium 3.5 - 5.1 mmol/L 3.5 4.0 3.8  Chloride 98 - 111 mmol/L 107 105 109  CO2 22 - 32 mmol/L 26 21 25   Calcium 8.9 - 10.3 mg/dL 8.8(L) 8.6(L) 9.0  Total Protein 6.5 - 8.1 g/dL 6.2(L) 5.8(L) 6.3(L)  Total Bilirubin 0.3 - 1.2 mg/dL 3.1(H) 2.2(H) 2.2(H)  Alkaline Phos 38 - 126 U/L 73 89 79  AST 15 - 41 U/L 31 37 29  ALT 0 - 44 U/L 26 25 24    . Lab Results  Component Value Date   IRON 113 04/13/2021   TIBC 425 04/13/2021   IRONPCTSAT 26 04/13/2021   (Iron and TIBC)  Lab Results  Component Value Date   FERRITIN 23 04/13/2021      Diagnosis 05/07/17 1. Stomach, biopsy, antrum - REACTIVE GASTROPATHY. - THERE IS NO EVIDENCE OF HELICOBACTER PYLORI, DYSPLASIA OR MALIGNANCY. - SEE COMMENT. 2. Colon, polyp(s), cecal x 2 - TUBULAR ADENOMA(S). - HIGH GRADE DYSPLASIA IS NOT IDENTIFIED. 3. Colon, polyp(s), descending x 2 - TUBULAR ADENOMA(S). - HIGH GRADE DYSPLASIA IS NOT IDENTIFIED. Microscopic Comment 1. This pattern can be associated with non-steroidal anti-inflammatory drugs (NSAIDS), alcohol or with other causes of chemical gastropathy. Helicobacter pylori are not identified with Warthin-Starry stain.   ASSESSMENT & PLAN:   60 y.o. female with   1. History of recurrent Pulmonary Embolisms Her previous  events appears to have been triggered by taking estrogen containing hormones and smoking. No overt Fhx of thrombophilias and no previous VTE with several pregnancies and surgeries - suggest against significant hereditary thrombophilia. Factor V Leiden was negative, Prothrombin gene mutation neg, APLA Ab panel - unrevealing.  PLAN:  -patient continues to have issues with GI bleeding and is off blood thinners as a result -no clear justification for restarting anticoagulation till her GI bleeding is completely control and then she could be considered for prophylactic ASA.  2. GI bleeding -recent w/u done -05/07/17 Upper Endoscopy and Colonoscopy show gastritis with 4 large colon polyps which were removed which were found to be tubular adenomas.    3. Autoimmune hepatitis 04/08/18 US Abdomen revealed Hepatic steatosis without other significant abnormality. -continue f/u with Dr Oletta Lamas for further evaluation and mx of GI bleeding.  4. Iron deficiency Anemia related to GI bleeding. Has needed previous IV Iron and PRBC transfusions. . Lab Results  Component Value Date   FERRITIN 23 04/13/2021   5. 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  Plan:  -Discussed pt labwork today, 04/13/2021; Ferritin 23, Sat Ratio stable at low level of 20, Hgb normal. Plt stable. Not anemic. -No signs of active inflammation in the liver at this time due to AST and ALT levels.  -Advised pt that Autoimmune Thrombocytopenia and liver cirrhosis are primary drivers for low Plt counts. Can be caused in part by body using Plt to stop bleeding.  -Advised pt that anything that stimulates her immune system will cause Plt to decrease further-- allergies,  infections, etc. -Advised pt her iron levels have gradually decreased. Her last IV was February 2021.  -conitnue liq iron po for continued Po replacement since patient had significant allergic reaction to IV Iron. -Would consider treatment thrombocytopenia if her PLT  <50K. No need at this time. -conitnue HCC screening and mx of liver cirrhosis with GI -Will see back in 6 months with labs. -Rx Iron Polysaccharide (liquid).    FOLLOW UP RTC with Dr Irene Limbo with labs in 6 months  . The total time spent in the appointment was 23 minutes and more than 50% was on counseling and direct patient cares.  All of the patient's questions were answered with apparent satisfaction. The patient knows to call the clinic with any problems, questions or concerns.   Sullivan Lone MD [place] AAHIVMS [facility] Hematology/Oncology [facility]  (Office):       [phone] (Work cell):  [phone] (Fax):           [phone]

## 2021-05-13 ENCOUNTER — Other Ambulatory Visit: Payer: Self-pay

## 2021-05-13 ENCOUNTER — Ambulatory Visit (HOSPITAL_COMMUNITY)
Admission: RE | Admit: 2021-05-13 | Discharge: 2021-05-13 | Disposition: A | Discharge status: Home / Self Care | Payer: Medicare Other | Source: Ambulatory Visit | Attending: Gastroenterology | Admitting: Gastroenterology

## 2021-05-13 DIAGNOSIS — K7460 Unspecified cirrhosis of liver: Secondary | ICD-10-CM | POA: Diagnosis not present

## 2021-05-17 ENCOUNTER — Ambulatory Visit: Payer: Medicare Other | Admitting: Family Medicine

## 2021-05-19 ENCOUNTER — Ambulatory Visit (INDEPENDENT_AMBULATORY_CARE_PROVIDER_SITE_OTHER): Payer: Medicare Other | Admitting: Family Medicine

## 2021-05-19 ENCOUNTER — Other Ambulatory Visit: Payer: Self-pay

## 2021-05-19 ENCOUNTER — Encounter: Payer: Self-pay | Admitting: Family Medicine

## 2021-05-19 VITALS — BP 116/62 | HR 82 | Temp 98.1°F | Ht 64.0 in | Wt 207.2 lb

## 2021-05-19 DIAGNOSIS — E119 Type 2 diabetes mellitus without complications: Secondary | ICD-10-CM | POA: Diagnosis not present

## 2021-05-19 DIAGNOSIS — E559 Vitamin D deficiency, unspecified: Secondary | ICD-10-CM

## 2021-05-19 DIAGNOSIS — T466X5A Adverse effect of antihyperlipidemic and antiarteriosclerotic drugs, initial encounter: Secondary | ICD-10-CM

## 2021-05-19 DIAGNOSIS — K719 Toxic liver disease, unspecified: Secondary | ICD-10-CM

## 2021-05-19 DIAGNOSIS — E538 Deficiency of other specified B group vitamins: Secondary | ICD-10-CM

## 2021-05-19 DIAGNOSIS — J432 Centrilobular emphysema: Secondary | ICD-10-CM | POA: Diagnosis not present

## 2021-05-19 DIAGNOSIS — K754 Autoimmune hepatitis: Secondary | ICD-10-CM

## 2021-05-19 DIAGNOSIS — K760 Fatty (change of) liver, not elsewhere classified: Secondary | ICD-10-CM

## 2021-05-19 DIAGNOSIS — K580 Irritable bowel syndrome with diarrhea: Secondary | ICD-10-CM

## 2021-05-19 DIAGNOSIS — E785 Hyperlipidemia, unspecified: Secondary | ICD-10-CM

## 2021-05-19 LAB — BAYER DCA HB A1C WAIVED: HB A1C (BAYER DCA - WAIVED): 5 % (ref 4.8–5.6)

## 2021-05-19 MED ORDER — FLUTICASONE PROPIONATE 50 MCG/ACT NA SUSP: 2.0000 | Freq: Every day | NASAL | 11 refills | Status: DC

## 2021-05-19 MED ORDER — VIBERZI 100 MG PO TABS: 100.0000 mg | ORAL_TABLET | Freq: Two times a day (BID) | ORAL | 2 refills | Status: DC

## 2021-05-19 NOTE — Progress Notes (Signed)
Subjective:  Patient ID: Kimberly Obrien, female    DOB: 07-22-1961  Age: 60 y.o. MRN: [id number]  CC: Medical Management of Chronic Issues   HPI Kimberly Obrien presents forFollow-up of diabetes. Patient checks blood sugar at home.   130s fasting and rarely checks postprandial. Will go to 200 if she misses a single dose of metformin. Patient denies symptoms such as polyuria, pol ydipsia, excessive hunger, nausea No significant hypoglycemic spells noted. Medications reviewed. Pt reports taking them regularly without complication/adverse reaction being reported today.  Still having diarrhea. Can be loose for a month at a time.   History Kimberly Obrien has a past medical history of Anemia, Anticoagulation monitoring by pharmacist, Arthritis, Asthma, Blood transfusion without reported diagnosis, Cataract, Cirrhosis ([place]), COPD (chronic obstructive pulmonary disease) ([place]), Diabetes mellitus without complication ([place]), Dyspnea, Elevated hemoglobin A1c (03/01/2018), Family history of Hodgkin's lymphoma, Family history of leukemia, Family history of lung cancer, Family history of stomach cancer, Family history of uterine cancer, History of colon polyps, History of pulmonary embolism (02/04/2013), Hyperlipidemia, NASH (nonalcoholic steatohepatitis), Neuromuscular disorder ([place]), PE (pulmonary embolism), and Sleep apnea.   She has a past surgical history that includes Tubal ligation; Wrist surgery (Right); Cholecystectomy; Spine surgery; Abdominal hysterectomy; Eye surgery; left and right heart catheterization with coronary angiogram (N/A, 05/13/2014); Incontinence surgery (2009); Cataract extraction, bilateral (Bilateral); Esophagogastroduodenoscopy (egd) with propofol (N/A, 05/07/2017); Colonoscopy with propofol (N/A, 05/07/2017); IR Venogram Hepatic W Hemodynamic Evaluation (03/15/2018); IR Transcatheter BX (03/15/2018); and Breast biopsy.   Her family history includes Bipolar disorder in her father;  Cancer in her cousin, maternal uncle, paternal aunt, paternal aunt, and paternal uncle; Cirrhosis in her mother; Colon polyps in her sister; Endometrial cancer (age of onset: 64) in her daughter; Heart disease in her father; Heart failure in her father and paternal grandfather; Hodgkin's lymphoma (age of onset: 92) in an other family member; Hyperlipidemia in her father; Hypertension in her brother; Irritable bowel syndrome in her mother and sister; Leukemia in her cousin; Lung cancer in her maternal uncle; Obesity in her daughter; Stomach cancer in her maternal uncle.She reports that she quit smoking about 9 years ago. Her smoking use included cigarettes. She started smoking about 43 years ago. She has a 100.00 pack-year smoking history. She has never used smokeless tobacco. She reports that she does not drink alcohol and does not use drugs.  Current Outpatient Medications on File Prior to Visit  Medication Sig Dispense Refill   acetaminophen (TYLENOL) 500 MG tablet Take 500 mg by mouth every 6 (six) hours as needed.     albuterol (VENTOLIN HFA) 108 (90 Base) MCG/ACT inhaler Inhale 2 puffs into the lungs every 6 (six) hours as needed for wheezing or shortness of breath. 18 g 0   Blood Glucose Monitoring Suppl (ONETOUCH VERIO REFLECT) w/Device KIT Test BS twice daily Dx e11.9 1 kit 0   fexofenadine (ALLEGRA) 180 MG tablet Take 1 tablet (180 mg total) by mouth daily. For allergy symptoms 30 tablet 11   furosemide (LASIX) 20 MG tablet Take 1 tablet (20 mg total) by mouth as needed. 30 tablet 3   ipratropium-albuterol (DUONEB) 0.5-2.5 (3) MG/3ML SOLN Take 3 mLs by nebulization every 6 (six) hours as needed. 360 mL 1   Lancets (ONETOUCH DELICA PLUS [id number]) MISC CHECK BLOOD SUGAR TWICE  DAILY AND AS NEEDED Dx E11.9 200 each 3   metFORMIN (GLUCOPHAGE-XR) 750 MG 24 hr tablet Take 1 tablet (750 mg total) by mouth daily with breakfast. 90 tablet 3  ONETOUCH VERIO test strip TEST BLOOD SUGAR TWICE  DAILY 200  strip 3   montelukast (SINGULAIR) 10 MG tablet Take 1 tablet (10 mg total) by mouth at bedtime. For allergy (Patient not taking: No sig reported) 30 tablet 3   ondansetron (ZOFRAN) 4 MG tablet Take 1 tablet (4 mg total) by mouth every 8 (eight) hours as needed for nausea or vomiting. (Patient not taking: No sig reported) 20 tablet 0   traZODone (DESYREL) 150 MG tablet Use from 1/3 to 1 tablet nightly as needed for sleep. (Patient not taking: No sig reported) 90 tablet 1   No current facility-administered medications on file prior to visit.    ROS Review of Systems  Constitutional: Negative.   HENT: Negative.    Eyes:  Negative for visual disturbance.  Respiratory:  Positive for shortness of breath.   Cardiovascular:  Positive for palpitations. Negative for chest pain.  Gastrointestinal:  Positive for abdominal distention, abdominal pain and diarrhea.  Musculoskeletal:  Negative for arthralgias.   Objective:  BP 116/62   Pulse 82   Temp 98.1 F (36.7 C)   Ht _0  (1.626 m)   Wt 207 lb 3.2 oz (94 kg)   SpO2 91%   BMI 35.57 kg/m   BP Readings from Last 3 Encounters:  05/19/21 116/62  04/13/21 (!) 140/51  01/11/21 118/60    Wt Readings from Last 3 Encounters:  05/19/21 207 lb 3.2 oz (94 kg)  04/13/21 208 lb 8 oz (94.6 kg)  01/11/21 206 lb 4 oz (93.6 kg)     Physical Exam Constitutional:      General: She is not in acute distress.    Appearance: She is well-developed.  HENT:     Head: Normocephalic and atraumatic.  Eyes:     Conjunctiva/sclera: Conjunctivae normal.     Pupils: Pupils are equal, round, and reactive to light.  Neck:     Thyroid: No thyromegaly.  Cardiovascular:     Rate and Rhythm: Normal rate and regular rhythm.     Heart sounds: Normal heart sounds. No murmur heard. Pulmonary:     Effort: Pulmonary effort is normal. No respiratory distress.     Breath sounds: Normal breath sounds. No wheezing or rales.  Abdominal:     General: Bowel sounds are  normal. There is no distension.     Palpations: Abdomen is soft.     Tenderness: There is abdominal tenderness (epigastric).  Musculoskeletal:        General: Normal range of motion.     Cervical back: Normal range of motion and neck supple.  Lymphadenopathy:     Cervical: No cervical adenopathy.  Skin:    General: Skin is warm and dry.  Neurological:     Mental Status: She is alert and oriented to person, place, and time.  Psychiatric:        Behavior: Behavior normal.        Thought Content: Thought content normal.        Judgment: Judgment normal.      Assessment & Plan:   Kimberly Obrien was seen today for medical management of chronic issues.  Diagnoses and all orders for this visit:  Irritable bowel syndrome with diarrhea -     CBC with Differential/Platelet -     CMP14+EGFR  Autoimmune hepatitis ([place])  Centrilobular emphysema (HCC) -     fluticasone (FLONASE) 50 MCG/ACT nasal spray; Place 2 sprays into both nostrils daily.  Fatty liver -  CBC with Differential/Platelet  Hepatotoxicity due to statin drug -     CMP14+EGFR  Hyperlipidemia, unspecified hyperlipidemia type  Vitamin D deficiency -     CMP14+EGFR -     VITAMIN D 25 Hydroxy (Vit-D Deficiency, Fractures)  Diabetes mellitus without complication (HCC) -     Bayer DCA Hb A1c Waived  Vitamin B 12 deficiency -     Vitamin B12  Other orders -     Eluxadoline (VIBERZI) 100 MG TABS; Take 1 tablet (100 mg total) by mouth 2 (two) times daily with a meal.     I have discontinued Kimberly Obrien's triamcinolone cream, colestipol, azithromycin, and HYDROcodone bit-homatropine. I am also having her start on Viberzi. Additionally, I am having her maintain her acetaminophen, OneTouch Verio Reflect, traZODone, furosemide, OneTouch Delica Plus [id number], OneTouch Verio, fexofenadine, ipratropium-albuterol, albuterol, metFORMIN, montelukast, ondansetron, and fluticasone.  Meds ordered this encounter   Medications   Eluxadoline (VIBERZI) 100 MG TABS    Sig: Take 1 tablet (100 mg total) by mouth 2 (two) times daily with a meal.    Dispense:  60 tablet    Refill:  2   fluticasone (FLONASE) 50 MCG/ACT nasal spray    Sig: Place 2 sprays into both nostrils daily.    Dispense:  16 g    Refill:  11     Follow-up: No follow-ups on file.  Claretta Fraise, M.D.

## 2021-05-20 ENCOUNTER — Other Ambulatory Visit: Payer: Self-pay | Admitting: *Deleted

## 2021-05-20 ENCOUNTER — Other Ambulatory Visit: Payer: Self-pay | Admitting: Family Medicine

## 2021-05-20 DIAGNOSIS — Z1231 Encounter for screening mammogram for malignant neoplasm of breast: Secondary | ICD-10-CM

## 2021-05-20 LAB — CBC WITH DIFFERENTIAL/PLATELET
Basophils Absolute: 0 10*3/uL (ref 0.0–0.2)
Basos: 0 %
EOS (ABSOLUTE): 0 10*3/uL (ref 0.0–0.4)
Eos: 1 %
Hematocrit: 41.6 % (ref 34.0–46.6)
Hemoglobin: 14.1 g/dL (ref 11.1–15.9)
Immature Grans (Abs): 0 10*3/uL (ref 0.0–0.1)
Immature Granulocytes: 1 %
Lymphocytes Absolute: 0.9 10*3/uL (ref 0.7–3.1)
Lymphs: 18 %
MCH: 28.7 pg (ref 26.6–33.0)
MCHC: 33.9 g/dL (ref 31.5–35.7)
MCV: 85 fL (ref 79–97)
Monocytes Absolute: 0.4 10*3/uL (ref 0.1–0.9)
Monocytes: 7 %
Neutrophils Absolute: 3.7 10*3/uL (ref 1.4–7.0)
Neutrophils: 73 %
Platelets: 82 10*3/uL — CL (ref 150–450)
RBC: 4.91 x10E6/uL (ref 3.77–5.28)
RDW: 13.7 % (ref 11.7–15.4)
WBC: 5.1 10*3/uL (ref 3.4–10.8)

## 2021-05-20 LAB — CMP14+EGFR
ALT: 27 IU/L (ref 0–32)
AST: 42 IU/L — ABNORMAL HIGH (ref 0–40)
Albumin/Globulin Ratio: 1.9 (ref 1.2–2.2)
Albumin: 3.9 g/dL (ref 3.8–4.9)
Alkaline Phosphatase: 83 IU/L (ref 44–121)
BUN/Creatinine Ratio: 10 — ABNORMAL LOW (ref 12–28)
BUN: 6 mg/dL — ABNORMAL LOW (ref 8–27)
Bilirubin Total: 2.2 mg/dL — ABNORMAL HIGH (ref 0.0–1.2)
CO2: 24 mmol/L (ref 20–29)
Calcium: 8.9 mg/dL (ref 8.7–10.3)
Chloride: 103 mmol/L (ref 96–106)
Creatinine, Ser: 0.6 mg/dL (ref 0.57–1.00)
Globulin, Total: 2.1 g/dL (ref 1.5–4.5)
Glucose: 138 mg/dL — ABNORMAL HIGH (ref 70–99)
Potassium: 3.5 mmol/L (ref 3.5–5.2)
Sodium: 139 mmol/L (ref 134–144)
Total Protein: 6 g/dL (ref 6.0–8.5)
eGFR: 103 mL/min/{1.73_m2} (ref >59)

## 2021-05-20 LAB — VITAMIN D 25 HYDROXY (VIT D DEFICIENCY, FRACTURES): Vit D, 25-Hydroxy: 22.2 ng/mL — ABNORMAL LOW (ref 30.0–100.0)

## 2021-05-20 LAB — VITAMIN B12: Vitamin B-12: 316 pg/mL (ref 232–1245)

## 2021-05-20 MED ORDER — VITAMIN D (ERGOCALCIFEROL) 1.25 MG (50000 UNIT) PO CAPS: 50000.0000 [IU] | ORAL_CAPSULE | ORAL | 3 refills | Status: DC

## 2021-05-20 NOTE — Progress Notes (Signed)
Dear Kimberly Obrien, Your Vitamin D is  low. You need a prescription strength supplement I will send that in for you. Nurse, if at all possible, could you send in a prescription for the patient for vitamin D 50,000 units, 1 p.o. weekly #13 with 3 refills? Many thanks, WS

## 2021-05-24 ENCOUNTER — Telehealth: Payer: Self-pay | Admitting: Family Medicine

## 2021-05-24 NOTE — Telephone Encounter (Signed)
TC to [facility] they did receive the Rx for the Viberzi, it was non-formulary, need to find alternative or do a PA. [facility] will send through Cover My Meds

## 2021-05-30 NOTE — Telephone Encounter (Signed)
Viberzi is denied because it is not on your plan's Drug List (formulary). Medication authorization requires the following: (1) You need to try two (2) of these covered drugs: (a) Loperamide capsule. (b) Xifaxan*. (2) OR your doctor needs to give [place] specific medical reasons why two (2) of the covered drug(s) are not appropriate for you

## 2021-05-30 NOTE — Telephone Encounter (Signed)
Rx #: [id number]  (Key: [id number]) Viberzi 100MG tablets   Form [employer] Medicare Part D Electronic Prior Authorization Form (2017 NCPDP)

## 2021-05-31 ENCOUNTER — Other Ambulatory Visit: Payer: Self-pay

## 2021-05-31 ENCOUNTER — Ambulatory Visit (INDEPENDENT_AMBULATORY_CARE_PROVIDER_SITE_OTHER): Payer: Medicare Other | Admitting: Family

## 2021-05-31 ENCOUNTER — Other Ambulatory Visit (HOSPITAL_COMMUNITY)
Admission: RE | Admit: 2021-05-31 | Discharge: 2021-05-31 | Disposition: A | Discharge status: Home / Self Care | Payer: Medicare Other | Source: Ambulatory Visit | Attending: Family | Admitting: Family

## 2021-05-31 ENCOUNTER — Encounter: Payer: Self-pay | Admitting: Family

## 2021-05-31 VITALS — BP 117/70 | HR 86 | Temp 98.4°F | Resp 20 | Ht 64.0 in | Wt 205.0 lb

## 2021-05-31 DIAGNOSIS — Z0000 Encounter for general adult medical examination without abnormal findings: Secondary | ICD-10-CM | POA: Diagnosis not present

## 2021-05-31 DIAGNOSIS — K580 Irritable bowel syndrome with diarrhea: Secondary | ICD-10-CM | POA: Diagnosis not present

## 2021-05-31 DIAGNOSIS — Z01411 Encounter for gynecological examination (general) (routine) with abnormal findings: Secondary | ICD-10-CM | POA: Insufficient documentation

## 2021-05-31 DIAGNOSIS — D500 Iron deficiency anemia secondary to blood loss (chronic): Secondary | ICD-10-CM

## 2021-05-31 DIAGNOSIS — Z0001 Encounter for general adult medical examination with abnormal findings: Secondary | ICD-10-CM

## 2021-05-31 DIAGNOSIS — N950 Postmenopausal bleeding: Secondary | ICD-10-CM

## 2021-05-31 NOTE — Progress Notes (Signed)
Subjective:    Patient ID: Kimberly Obrien, female    DOB: 22-Dec-1960, 60 y.o.   MRN: [id number]  Chief Complaint  Patient presents with   Annual Exam   Pt presents to the office today for CPE with pap. She wanted a female provider today, but will follow up with PCP for chronic medical management.  She is followed by Hematologists every 6 months for iron deficiency. Followed by GI every 6 months for IBS.  Gynecologic Exam The patient's primary symptoms include pelvic pain. The patient's pertinent negatives include no genital itching, genital odor, vaginal bleeding or vaginal discharge. The problem occurs constantly. The problem has been waxing and waning. The pain is mild. Associated symptoms include constipation, diarrhea, discolored urine, hematuria and painful intercourse. Pertinent negatives include no dysuria, flank pain, frequency or headaches. She has tried nothing for the symptoms.     Review of Systems  Gastrointestinal:  Positive for constipation and diarrhea.  Genitourinary:  Positive for hematuria and pelvic pain. Negative for dysuria, flank pain, frequency and vaginal discharge.  Neurological:  Negative for headaches.  All other systems reviewed and are negative.  Family History  Problem Relation Age of Onset   Cirrhosis Mother        NASH   Irritable bowel syndrome Mother    Hyperlipidemia Father    Heart failure Father    Bipolar disorder Father    Heart disease Father    Obesity Daughter    Endometrial cancer Daughter 33   Hypertension Brother    Colon polyps Sister        unknown number but fewer than patient   Irritable bowel syndrome Sister    Heart failure Paternal Grandfather    Stomach cancer Maternal Uncle        dx 34s/50s   Cancer Paternal Aunt        NOS   Cancer Paternal Uncle        NOS   Lung cancer Maternal Uncle        dx 40s/50s, smoker   Cancer Maternal Uncle        NOS   Cancer Paternal Aunt        NOS   Leukemia Cousin        dx  8s, died 2 weeks after diagnosis (maternal first cousin)   Cancer Cousin        NOS (paternal first cousin)   Hodgkin's lymphoma Other 47       4th degree relative (mother's half-sibling's son)   Heart attack Neg Hx    Miscarriages / Stillbirths Neg Hx    Colon cancer Neg Hx    Esophageal cancer Neg Hx    Rectal cancer Neg Hx    Social History   Socioeconomic History   Marital status: Married    Spouse name: Shanon Brow   Number of children: 4   Years of education: GED   Highest education level: GED or equivalent  Occupational History   Occupation: Unemployed  Tobacco Use   Smoking status: Former    Packs/day: 2.00    Years: 50.00    Pack years: 100.00    Types: Cigarettes    Start date: 08/22/1977    Quit date: 04/21/2012    Years since quitting: 9.1   Smokeless tobacco: Never  Vaping Use   Vaping Use: Never used  Substance and Sexual Activity   Alcohol use: No   Drug use: No   Sexual activity: Not Currently  Birth control/protection: Surgical  Other Topics Concern   Not on file  Social History Narrative   Not on file   Social Determinants of Health   Financial Resource Strain: Low Risk    Difficulty of Paying Living Expenses: Not hard at all  Food Insecurity: No Food Insecurity   Worried About Charity fundraiser in the Last Year: Never true   Arboriculturist in the Last Year: Never true  Transportation Needs: No Transportation Needs   Lack of Transportation (Medical): No   Lack of Transportation (Non-Medical): No  Physical Activity: Inactive   Days of Exercise per Week: 0 days   Minutes of Exercise per Session: 0 min  Stress: Stress Concern Present   Feeling of Stress : To some extent  Social Connections: Moderately Isolated   Frequency of Communication with Friends and Family: More than three times a week   Frequency of Social Gatherings with Friends and Family: More than three times a week   Attends Religious Services: Never   Marine scientist or  Organizations: No   Attends Archivist Meetings: Never   Marital Status: Married       Objective:   Physical Exam Vitals reviewed.  Constitutional:      General: She is not in acute distress.    Appearance: She is well-developed. She is obese.  HENT:     Head: Normocephalic and atraumatic.     Right Ear: Tympanic membrane normal.     Left Ear: Tympanic membrane normal.  Eyes:     Pupils: Pupils are equal, round, and reactive to light.  Neck:     Thyroid: No thyromegaly.  Cardiovascular:     Rate and Rhythm: Normal rate and regular rhythm.     Heart sounds: Normal heart sounds. No murmur heard. Pulmonary:     Effort: Pulmonary effort is normal. No respiratory distress.     Breath sounds: Normal breath sounds. No wheezing.  Chest:  Breasts:    Right: No swelling, bleeding, inverted nipple, mass, nipple discharge, skin change or tenderness.     Left: No swelling, bleeding, inverted nipple, mass, nipple discharge, skin change or tenderness.  Abdominal:     General: Bowel sounds are normal. There is no distension.     Palpations: Abdomen is soft.     Tenderness: There is no abdominal tenderness.  Genitourinary:    Comments: Bimanual exam- no adnexal masses  ovaries nonpalpable   Cervix parous and pink- Bleeding present  Musculoskeletal:        General: No tenderness. Normal range of motion.     Cervical back: Normal range of motion and neck supple.  Skin:    General: Skin is warm and dry.  Neurological:     Mental Status: She is alert and oriented to person, place, and time.     Cranial Nerves: No cranial nerve deficit.     Deep Tendon Reflexes: Reflexes are normal and symmetric.  Psychiatric:        Behavior: Behavior normal.        Thought Content: Thought content normal.        Judgment: Judgment normal.      BP 117/70   Pulse 86   Temp 98.4 F (36.9 C) (Temporal)   Resp 20   Ht 5' 4"  (1.626 m)   Wt 205 lb (93 kg)   SpO2 95%   BMI 35.19 kg/m       Assessment & Plan:  Kimberly Obrien comes in today with chief complaint of Annual Exam   Diagnosis and orders addressed:  1. Annual physical exam - CMP14+EGFR - CBC with Differential/Platelet - TSH - Cytology - PAP([place]) - US Pelvic Complete With Transvaginal; Future  2. Encounter for gynecological examination with abnormal finding - CMP14+EGFR - CBC with Differential/Platelet - Cytology - PAP([place])  3. Irritable bowel syndrome with diarrhea - CMP14+EGFR - CBC with Differential/Platelet  4. Iron deficiency anemia due to chronic blood loss - CMP14+EGFR - CBC with Differential/Platelet  5. Post-menopausal bleeding [place] ordered, will do referral to GYN - CMP14+EGFR - CBC with Differential/Platelet - Cytology - PAP() - US Pelvic Complete With Transvaginal; Future - Ambulatory referral to Gynecology   Labs pending Health Maintenance reviewed Diet and exercise encouraged  Follow up plan: Keep follow up with specialists and PCP    Evelina Dun, FNP

## 2021-05-31 NOTE — Patient Instructions (Signed)
Postmenopausal Bleeding Postmenopausal bleeding is any bleeding that a woman has after she has entered menopause. Menopause is the end of a woman's fertile years. After menopause, a woman no longer ovulates and does not have menstrual periods. Therefore, she should no longer have bleeding from her vagina. Postmenopausal bleeding may have various causes, including: Menopausal hormone therapy (MHT). Endometrial atrophy. After menopause, low estrogen hormone levels cause the membrane that lines the uterus (endometrium) to become thin. You may have bleeding as the endometrium thins. Endometrial hyperplasia. This condition is caused by excess estrogen hormones and low levels of progesterone hormones. The excess estrogen causes the endometrium to thicken, which can lead to bleeding. In some cases, this can lead to cancer of the uterus. Endometrial cancer. Noncancerous growths (polyps) on the endometrium, the lining of the uterus, or the cervix. Uterine fibroids. These are noncancerous growths in or around the uterus muscle tissue that can cause heavy bleeding. Any type of postmenopausal bleeding, even if it appears to be a typical menstrual period, should be checked by your health care provider. Treatment will depend on the cause of the bleeding. Follow these instructions at home:  Pay attention to any changes in your symptoms. Let your health care provider know about them. Avoid using tampons and douches as told by your health care provider. Change your pads regularly. Get regular pelvic exams, including Pap tests, as told by your health care provider. Take iron supplements as told by your health care provider. Take over-the-counter and prescription medicines only as told by your health care provider. Keep all follow-up visits. This is important. Contact a health care provider if: You have new bleeding from the vagina after menopause. You have pain in your abdomen. Get help right away if: You have  a fever or chills. You have severe pain with bleeding. You are passing blood clots. You have heavy bleeding, need more than 1 pad an hour, and have never experienced this before. You have headaches or feel faint or dizzy. Summary Postmenopausal bleeding is any bleeding that a woman has after she has entered into menopause. Postmenopausal bleeding may have various causes. Treatment will depend on the cause of the bleeding. Any type of postmenopausal bleeding, even if it appears to be a typical menstrual period, should be checked by your health care provider. Be sure to pay attention to any changes in your symptoms and keep all follow-up visits. This information is not intended to replace advice given to you by your health care provider. Make sure you discuss any questions you have with your health care provider. Document Revised: 01/22/2020 Document Reviewed: 01/22/2020 Elsevier Patient Education  [place].

## 2021-06-01 ENCOUNTER — Other Ambulatory Visit: Payer: Self-pay | Admitting: Family Medicine

## 2021-06-01 LAB — CBC WITH DIFFERENTIAL/PLATELET
Basophils Absolute: 0 10*3/uL (ref 0.0–0.2)
Basos: 0 %
EOS (ABSOLUTE): 0 10*3/uL (ref 0.0–0.4)
Eos: 1 %
Hematocrit: 41.4 % (ref 34.0–46.6)
Hemoglobin: 14.3 g/dL (ref 11.1–15.9)
Immature Grans (Abs): 0 10*3/uL (ref 0.0–0.1)
Immature Granulocytes: 0 %
Lymphocytes Absolute: 0.9 10*3/uL (ref 0.7–3.1)
Lymphs: 19 %
MCH: 28.9 pg (ref 26.6–33.0)
MCHC: 34.5 g/dL (ref 31.5–35.7)
MCV: 84 fL (ref 79–97)
Monocytes Absolute: 0.3 10*3/uL (ref 0.1–0.9)
Monocytes: 6 %
Neutrophils Absolute: 3.6 10*3/uL (ref 1.4–7.0)
Neutrophils: 74 %
Platelets: 87 10*3/uL — CL (ref 150–450)
RBC: 4.95 x10E6/uL (ref 3.77–5.28)
RDW: 12.9 % (ref 11.7–15.4)
WBC: 4.9 10*3/uL (ref 3.4–10.8)

## 2021-06-01 LAB — CMP14+EGFR
ALT: 31 IU/L (ref 0–32)
AST: 51 IU/L — ABNORMAL HIGH (ref 0–40)
Albumin/Globulin Ratio: 1.6 (ref 1.2–2.2)
Albumin: 3.9 g/dL (ref 3.8–4.9)
Alkaline Phosphatase: 91 IU/L (ref 44–121)
BUN/Creatinine Ratio: 11 — ABNORMAL LOW (ref 12–28)
BUN: 7 mg/dL — ABNORMAL LOW (ref 8–27)
Bilirubin Total: 2.2 mg/dL — ABNORMAL HIGH (ref 0.0–1.2)
CO2: 23 mmol/L (ref 20–29)
Calcium: 8.9 mg/dL (ref 8.7–10.3)
Chloride: 105 mmol/L (ref 96–106)
Creatinine, Ser: 0.66 mg/dL (ref 0.57–1.00)
Globulin, Total: 2.4 g/dL (ref 1.5–4.5)
Glucose: 164 mg/dL — ABNORMAL HIGH (ref 70–99)
Potassium: 3.4 mmol/L — ABNORMAL LOW (ref 3.5–5.2)
Sodium: 142 mmol/L (ref 134–144)
Total Protein: 6.3 g/dL (ref 6.0–8.5)
eGFR: 100 mL/min/{1.73_m2} (ref >59)

## 2021-06-01 LAB — TSH: TSH: 0.985 u[IU]/mL (ref 0.450–4.500)

## 2021-06-01 MED ORDER — RIFAXIMIN 550 MG PO TABS: 550.0000 mg | ORAL_TABLET | Freq: Two times a day (BID) | ORAL | 1 refills | Status: DC

## 2021-06-01 NOTE — Telephone Encounter (Signed)
Please let the patient know that I sent their prescription to their pharmacy. Thanks, WS

## 2021-06-02 ENCOUNTER — Telehealth: Payer: Self-pay | Admitting: *Deleted

## 2021-06-02 NOTE — Telephone Encounter (Signed)
Kimberly Obrien (Key: [facility]) Rx #: [id number] Xifaxan 550MG tablets   Form [employer] Medicare Part D Electronic Prior Authorization Form (2017 NCPDP)  Sent to plan

## 2021-06-06 DIAGNOSIS — Z961 Presence of intraocular lens: Secondary | ICD-10-CM | POA: Diagnosis not present

## 2021-06-06 DIAGNOSIS — H524 Presbyopia: Secondary | ICD-10-CM | POA: Diagnosis not present

## 2021-06-06 DIAGNOSIS — H52223 Regular astigmatism, bilateral: Secondary | ICD-10-CM | POA: Diagnosis not present

## 2021-06-06 DIAGNOSIS — H02885 Meibomian gland dysfunction left lower eyelid: Secondary | ICD-10-CM | POA: Diagnosis not present

## 2021-06-06 DIAGNOSIS — H26493 Other secondary cataract, bilateral: Secondary | ICD-10-CM | POA: Diagnosis not present

## 2021-06-06 DIAGNOSIS — H02882 Meibomian gland dysfunction right lower eyelid: Secondary | ICD-10-CM | POA: Diagnosis not present

## 2021-06-06 DIAGNOSIS — H04123 Dry eye syndrome of bilateral lacrimal glands: Secondary | ICD-10-CM | POA: Diagnosis not present

## 2021-06-06 LAB — CYTOLOGY - PAP
Diagnosis: NEGATIVE
Diagnosis: REACTIVE

## 2021-06-06 NOTE — Telephone Encounter (Signed)
Your prior authorization request has been denied. COMPLETE APPEAL Your request for prior authorization was denied, but an appeal is available for your patient. For assistance, contact our support team at [phone].  Message from plan: Request Reference Number: [id number]. XIFAXAN TAB 550MG is denied for not meeting the prior authorization requirement(s)

## 2021-06-09 ENCOUNTER — Other Ambulatory Visit: Payer: Self-pay

## 2021-06-09 ENCOUNTER — Ambulatory Visit (HOSPITAL_COMMUNITY)
Admission: RE | Admit: 2021-06-09 | Discharge: 2021-06-09 | Disposition: A | Discharge status: Home / Self Care | Payer: Medicare Other | Source: Ambulatory Visit | Attending: Family | Admitting: Family

## 2021-06-09 DIAGNOSIS — N950 Postmenopausal bleeding: Secondary | ICD-10-CM | POA: Insufficient documentation

## 2021-06-09 DIAGNOSIS — Z0000 Encounter for general adult medical examination without abnormal findings: Secondary | ICD-10-CM | POA: Diagnosis not present

## 2021-06-13 ENCOUNTER — Other Ambulatory Visit: Payer: Self-pay

## 2021-06-13 ENCOUNTER — Encounter: Payer: Self-pay | Admitting: Family Medicine

## 2021-06-13 ENCOUNTER — Ambulatory Visit (INDEPENDENT_AMBULATORY_CARE_PROVIDER_SITE_OTHER): Payer: Medicare Other | Admitting: Family Medicine

## 2021-06-13 DIAGNOSIS — B379 Candidiasis, unspecified: Secondary | ICD-10-CM | POA: Diagnosis not present

## 2021-06-13 DIAGNOSIS — N3001 Acute cystitis with hematuria: Secondary | ICD-10-CM | POA: Diagnosis not present

## 2021-06-13 LAB — URINALYSIS, ROUTINE W REFLEX MICROSCOPIC
Bilirubin, UA: NEGATIVE
Glucose, UA: NEGATIVE
Nitrite, UA: NEGATIVE
Specific Gravity, UA: >1.03 — ABNORMAL HIGH (ref 1.005–1.030)
Urobilinogen, Ur: 2 mg/dL — ABNORMAL HIGH (ref 0.2–1.0)
pH, UA: 5.5 (ref 5.0–7.5)

## 2021-06-13 LAB — MICROSCOPIC EXAMINATION
RBC, Urine: >30 /hpf — AB (ref 0–2)
Renal Epithel, UA: NONE SEEN /hpf

## 2021-06-13 MED ORDER — MONISTAT 3 COMBINATION PACK 200 & 2 MG-% (9GM) VA KIT: PACK | VAGINAL | 0 refills | Status: DC

## 2021-06-13 MED ORDER — CEPHALEXIN 500 MG PO CAPS: 500.0000 mg | ORAL_CAPSULE | Freq: Two times a day (BID) | ORAL | 0 refills | Status: AC

## 2021-06-13 NOTE — Progress Notes (Signed)
Telephone visit  Subjective: CC: UTI PCP: Claretta Fraise, MD SAY:[id number] Kimberly Obrien is a 60 y.o. female calls for telephone consult today. Patient provides verbal consent for consult held via phone.  Due to COVID-19 pandemic this visit was conducted virtually. This visit type was conducted due to national recommendations for restrictions regarding the COVID-19 Pandemic (e.g. social distancing, sheltering in place) in an effort to limit this patient's exposure and mitigate transmission in our community. All issues noted in this document were discussed and addressed.  A physical exam was not performed with this format.   Location of patient: home Location of provider: [facility] Others present for call: none  1. Urinary symptoms Patient reports a 7 day h/o pelvic pressure, hematuria and urinary frequency.  No urgency, hematuria, fevers, chills, abdominal pain, nausea, vomiting, back pain, vaginal discharge.  Patient has used nothing for symptoms.    ROS: Per HPI  Allergies  Allergen Reactions   Aciphex [Rabeprazole Sodium] Rash   Fluconazole Rash and Hives   Lansoprazole Hives   Septra [Sulfamethoxazole-Trimethoprim] Nausea And Vomiting    "made me bleed"   Sucralfate Nausea Only   Vancomycin Diarrhea   Claritin [Loratadine] Other (See Comments)    Tired    Crestor [Rosuvastatin] Other (See Comments)    Elevated blood glucose and abdominal pain   Doxycycline Rash   Ibuprofen Other (See Comments)    Upset stomach   Lipitor [Atorvastatin] Other (See Comments)    Myalgias, leg pain.  Problems with pancreas, sugars went up also.   Macrobid [Nitrofurantoin Macrocrystal] Nausea And Vomiting   Metoprolol Nausea And Vomiting   Metronidazole Other (See Comments) and Nausea Only    Dizziness, nausea, dry mouth and some shortness of breath   Nsaids Other (See Comments)    Upset stomach   Omeprazole     bloating   Rabeprazole Rash   Past Medical History:  Diagnosis Date   Anemia     Anticoagulation monitoring by pharmacist    Arthritis    Asthma    Blood transfusion without reported diagnosis    Cataract    Cirrhosis ([place])    COPD (chronic obstructive pulmonary disease) ([place])    Diabetes mellitus without complication (HCC)    Dyspnea    exertion   Elevated hemoglobin A1c 03/01/2018   Family history of Hodgkin's lymphoma    Family history of leukemia    Family history of lung cancer    Family history of stomach cancer    Family history of uterine cancer    History of colon polyps    History of pulmonary embolism 02/04/2013   Hyperlipidemia    NASH (nonalcoholic steatohepatitis)    Neuromuscular disorder (HCC)    back surgery   PE (pulmonary embolism)    Sleep apnea     Current Outpatient Medications:    acetaminophen (TYLENOL) 500 MG tablet, Take 500 mg by mouth every 6 (six) hours as needed., Disp: , Rfl:    albuterol (VENTOLIN HFA) 108 (90 Base) MCG/ACT inhaler, Inhale 2 puffs into the lungs every 6 (six) hours as needed for wheezing or shortness of breath., Disp: 18 g, Rfl: 0   Blood Glucose Monitoring Suppl (ONETOUCH VERIO REFLECT) w/Device KIT, Test BS twice daily Dx e11.9, Disp: 1 kit, Rfl: 0   Eluxadoline (VIBERZI) 100 MG TABS, Take 1 tablet (100 mg total) by mouth 2 (two) times daily with a meal., Disp: 60 tablet, Rfl: 2   fexofenadine (ALLEGRA) 180 MG tablet, Take 1 tablet (  180 mg total) by mouth daily. For allergy symptoms, Disp: 30 tablet, Rfl: 11   fluticasone (FLONASE) 50 MCG/ACT nasal spray, Place 2 sprays into both nostrils daily., Disp: 16 g, Rfl: 11   furosemide (LASIX) 20 MG tablet, Take 1 tablet (20 mg total) by mouth as needed., Disp: 30 tablet, Rfl: 3   ipratropium-albuterol (DUONEB) 0.5-2.5 (3) MG/3ML SOLN, Take 3 mLs by nebulization every 6 (six) hours as needed., Disp: 360 mL, Rfl: 1   Lancets (ONETOUCH DELICA PLUS [id number]) MISC, CHECK BLOOD SUGAR TWICE  DAILY AND AS NEEDED Dx E11.9, Disp: 200 each, Rfl: 3   metFORMIN (GLUCOPHAGE-XR)  750 MG 24 hr tablet, Take 1 tablet (750 mg total) by mouth daily with breakfast., Disp: 90 tablet, Rfl: 3   montelukast (SINGULAIR) 10 MG tablet, Take 1 tablet (10 mg total) by mouth at bedtime. For allergy, Disp: 30 tablet, Rfl: 3   ondansetron (ZOFRAN) 4 MG tablet, Take 1 tablet (4 mg total) by mouth every 8 (eight) hours as needed for nausea or vomiting., Disp: 20 tablet, Rfl: 0   ONETOUCH VERIO test strip, TEST BLOOD SUGAR TWICE  DAILY, Disp: 200 strip, Rfl: 3   rifaximin (XIFAXAN) 550 MG TABS tablet, Take 1 tablet (550 mg total) by mouth 2 (two) times daily., Disp: 180 tablet, Rfl: 1   traZODone (DESYREL) 150 MG tablet, Use from 1/3 to 1 tablet nightly as needed for sleep., Disp: 90 tablet, Rfl: 1   Vitamin D, Ergocalciferol, (DRISDOL) 1.25 MG (50000 UNIT) CAPS capsule, Take 1 capsule (50,000 Units total) by mouth every 7 (seven) days., Disp: 13 capsule, Rfl: 3  Assessment/ Plan: 60 y.o. female   Acute cystitis with hematuria - Plan: Urinalysis, Routine w reflex microscopic, Urine Culture, cephALEXin (KEFLEX) 500 MG capsule  Yeast infection - Plan: miconazole nitrate (MONISTAT 3 COMBINATION PACK) 200 & 2 MG-% (9GM) KIT  UA with >30 RBCs, moderate bacteria and yeast present.  Pt states she can use Monistat so this was sent.  Keflex sent for UTI. UCx ordered.  Home care instructions reviewed and return precautions discussed. Follow up prn  Start time: 1:02pm End time: 1:09pm  Total time spent on patient care (including telephone call/ virtual visit): 7 minutes  [place], [place] [phone]

## 2021-06-16 LAB — URINE CULTURE

## 2021-06-21 ENCOUNTER — Other Ambulatory Visit: Payer: Self-pay

## 2021-06-21 ENCOUNTER — Encounter: Payer: Self-pay | Admitting: Adult Health

## 2021-06-21 ENCOUNTER — Ambulatory Visit: Payer: Medicare Other | Admitting: Adult Health

## 2021-06-21 VITALS — BP 137/73 | HR 92 | Ht 64.0 in | Wt 209.0 lb

## 2021-06-21 DIAGNOSIS — N72 Inflammatory disease of cervix uteri: Secondary | ICD-10-CM | POA: Insufficient documentation

## 2021-06-21 DIAGNOSIS — Z8742 Personal history of other diseases of the female genital tract: Secondary | ICD-10-CM

## 2021-06-21 DIAGNOSIS — N888 Other specified noninflammatory disorders of cervix uteri: Secondary | ICD-10-CM

## 2021-06-21 MED ORDER — CLINDAMYCIN PHOSPHATE 100 MG VA SUPP: 100.0000 mg | Freq: Every day | VAGINAL | 1 refills | Status: DC

## 2021-06-21 NOTE — Progress Notes (Signed)
Subjective:     Patient ID: Kimberly Obrien, female   DOB: [birth year], 60 y.o.   MRN: [id number]  HPI Kimberly Obrien is a 60 year old white female, married, sp supracervical hysterectomy, in for vaginal bleeding for about a month, she had pap with Kimberly Dun NP, 05/31/21 and had blood at cervix, pap was negative with benign changes, and she had pelvic US 06/09/21 that showed nabothian cysts in residual cervix and left ovary not seen and right ovary normal. She has also been treated for UTI, recently. She has GI issues and bleeds easily she says. She said she had mess placed to hold bladder up and had trouble with urination then.   PCP is Dr Kimberly Obrien  Review of Systems Has had vaginal bleeding Has not had sex lately, hurts of cervix hit Reviewed past medical,surgical, social and family history. Reviewed medications and allergies.     Objective:   Physical Exam BP 137/73 (BP Location: Right Arm, Patient Position: Sitting, Cuff Size: Normal)   Pulse 92   Ht 5' 4"  (1.626 m)   Wt 209 lb (94.8 kg)   BMI 35.87 kg/m     Skin warm and dry.Pelvic: external genitalia is normal in appearance no lesions, vagina: pale with loss of rugae,urethra has no lesions or masses noted, cervix has numerous nabothian cysts inner os, no bleeding today,it is tender to touch, uterus is absent,adnexa: no masses or tenderness noted. Bladder is non tender and no masses felt.  Examination chaperoned by Marcelino Scot RN AA is 0 Fall risk is low Depression screen [facility] 2/9 06/21/2021 05/31/2021 05/19/2021  Decreased Interest 3 0 1  Down, Depressed, Hopeless 0 0 0  PHQ - 2 Score 3 0 1  Altered sleeping 3 3 3   Tired, decreased energy 3 3 3   Change in appetite 0 0 0  Feeling bad or failure about yourself  0 0 1  Trouble concentrating 3 3 3   Moving slowly or fidgety/restless 0 0 0  Suicidal thoughts 0 0 0  PHQ-9 Score 12 9 11   Difficult doing work/chores - Somewhat difficult Very difficult  Some recent data might be hidden     GAD 7 : Generalized Anxiety Score 06/21/2021 05/31/2021 05/19/2021  Nervous, Anxious, on Edge 0 1 0  Control/stop worrying 1 1 0  Worry too much - different things 1 1 1   Trouble relaxing 0 0 1  Restless 0 0 0  Easily annoyed or irritable 1 2 0  Afraid - awful might happen 0 0 0  Total GAD 7 Score 3 5 2   Anxiety Difficulty - Somewhat difficult Not difficult at all   .  [place] - 06/21/21 1119       Pregnancy Intention Screening   Does the patient want to become pregnant in the next year? N/A    Does the patient's partner want to become pregnant in the next year? N/A    Would the patient like to discuss contraceptive options today? N/A      Contraception Wrap Up   Current Method Female Sterilization   [facility]   End Method Female Sterilization   [facility]   Contraception Counseling Provided No           . Assessment:     1. H/O vaginal bleeding   2. Cervicitis with nabothian cyst Will rx clindamycin supp, and recheck next week Meds ordered this encounter  Medications   clindamycin (CLEOCIN) 100 MG vaginal suppository    Sig: Place 1 suppository (100  mg total) vaginally at bedtime.    Dispense:  3 suppository    Refill:  1    Order Specific Question:   Supervising Provider    Answer:   Florian Buff [[id number]]       Plan:     Follow up 06/30/21 for recheck

## 2021-06-23 ENCOUNTER — Encounter (HOSPITAL_COMMUNITY): Payer: Self-pay

## 2021-06-23 ENCOUNTER — Emergency Department (HOSPITAL_COMMUNITY): Payer: Medicare Other

## 2021-06-23 ENCOUNTER — Emergency Department (HOSPITAL_COMMUNITY)
Admission: EM | Admit: 2021-06-23 | Discharge: 2021-06-23 | Disposition: A | Discharge status: Home / Self Care | Payer: Medicare Other | Attending: Emergency Medicine | Admitting: Emergency Medicine

## 2021-06-23 ENCOUNTER — Other Ambulatory Visit: Payer: Self-pay

## 2021-06-23 DIAGNOSIS — R109 Unspecified abdominal pain: Secondary | ICD-10-CM

## 2021-06-23 DIAGNOSIS — J449 Chronic obstructive pulmonary disease, unspecified: Secondary | ICD-10-CM | POA: Insufficient documentation

## 2021-06-23 DIAGNOSIS — K7460 Unspecified cirrhosis of liver: Secondary | ICD-10-CM | POA: Diagnosis not present

## 2021-06-23 DIAGNOSIS — R112 Nausea with vomiting, unspecified: Secondary | ICD-10-CM | POA: Diagnosis not present

## 2021-06-23 DIAGNOSIS — I10 Essential (primary) hypertension: Secondary | ICD-10-CM | POA: Diagnosis not present

## 2021-06-23 DIAGNOSIS — J45909 Unspecified asthma, uncomplicated: Secondary | ICD-10-CM | POA: Diagnosis not present

## 2021-06-23 DIAGNOSIS — N132 Hydronephrosis with renal and ureteral calculous obstruction: Secondary | ICD-10-CM | POA: Diagnosis not present

## 2021-06-23 DIAGNOSIS — Z87891 Personal history of nicotine dependence: Secondary | ICD-10-CM | POA: Insufficient documentation

## 2021-06-23 DIAGNOSIS — R9431 Abnormal electrocardiogram [ECG] [EKG]: Secondary | ICD-10-CM | POA: Diagnosis not present

## 2021-06-23 DIAGNOSIS — R1032 Left lower quadrant pain: Secondary | ICD-10-CM | POA: Diagnosis not present

## 2021-06-23 DIAGNOSIS — K449 Diaphragmatic hernia without obstruction or gangrene: Secondary | ICD-10-CM | POA: Diagnosis not present

## 2021-06-23 DIAGNOSIS — N201 Calculus of ureter: Secondary | ICD-10-CM | POA: Diagnosis not present

## 2021-06-23 DIAGNOSIS — K7689 Other specified diseases of liver: Secondary | ICD-10-CM | POA: Diagnosis not present

## 2021-06-23 LAB — COMPREHENSIVE METABOLIC PANEL
ALT: 31 U/L (ref 0–44)
AST: 42 U/L — ABNORMAL HIGH (ref 15–41)
Albumin: 3.8 g/dL (ref 3.5–5.0)
Alkaline Phosphatase: 72 U/L (ref 38–126)
Anion gap: 11 (ref 5–15)
BUN: 10 mg/dL (ref 6–20)
CO2: 23 mmol/L (ref 22–32)
Calcium: 9.2 mg/dL (ref 8.9–10.3)
Chloride: 107 mmol/L (ref 98–111)
Creatinine, Ser: 0.63 mg/dL (ref 0.44–1.00)
GFR, Estimated: >60 mL/min (ref >60)
Glucose, Bld: 155 mg/dL — ABNORMAL HIGH (ref 70–99)
Potassium: 3.2 mmol/L — ABNORMAL LOW (ref 3.5–5.1)
Sodium: 141 mmol/L (ref 135–145)
Total Bilirubin: 2.6 mg/dL — ABNORMAL HIGH (ref 0.3–1.2)
Total Protein: 6.8 g/dL (ref 6.5–8.1)

## 2021-06-23 LAB — URINALYSIS, ROUTINE W REFLEX MICROSCOPIC
Bilirubin Urine: NEGATIVE
Glucose, UA: NEGATIVE mg/dL
Ketones, ur: 20 mg/dL — AB
Leukocytes,Ua: NEGATIVE
Nitrite: NEGATIVE
Protein, ur: 30 mg/dL — AB
RBC / HPF: >50 RBC/hpf — ABNORMAL HIGH (ref 0–5)
Specific Gravity, Urine: 1.017 (ref 1.005–1.030)
pH: 6 (ref 5.0–8.0)

## 2021-06-23 LAB — LIPASE, BLOOD: Lipase: 65 U/L — ABNORMAL HIGH (ref 11–51)

## 2021-06-23 LAB — CBC
HCT: 41.1 % (ref 36.0–46.0)
Hemoglobin: 13.8 g/dL (ref 12.0–15.0)
MCH: 29.1 pg (ref 26.0–34.0)
MCHC: 33.6 g/dL (ref 30.0–36.0)
MCV: 86.7 fL (ref 80.0–100.0)
Platelets: 71 10*3/uL — ABNORMAL LOW (ref 150–400)
RBC: 4.74 MIL/uL (ref 3.87–5.11)
RDW: 14.1 % (ref 11.5–15.5)
WBC: 5.7 10*3/uL (ref 4.0–10.5)
nRBC: 0 % (ref 0.0–0.2)

## 2021-06-23 MED ORDER — SODIUM CHLORIDE 0.9 % IV BOLUS
1000.0000 mL | Freq: Once | INTRAVENOUS | Status: AC
  Administered 2021-06-23: 1000 mL via INTRAVENOUS

## 2021-06-23 MED ORDER — TRAMADOL HCL 50 MG PO TABS
100.0000 mg | ORAL_TABLET | Freq: Once | ORAL | Status: AC
  Administered 2021-06-23: 100 mg via ORAL
  Filled 2021-06-23: qty 2

## 2021-06-23 MED ORDER — TRAMADOL HCL 50 MG PO TABS: 50.0000 mg | ORAL_TABLET | Freq: Four times a day (QID) | ORAL | 0 refills | Status: DC | PRN

## 2021-06-23 MED ORDER — HYDROMORPHONE HCL 1 MG/ML IJ SOLN
1.0000 mg | Freq: Once | INTRAMUSCULAR | Status: AC
  Administered 2021-06-23: 1 mg via INTRAVENOUS
  Filled 2021-06-23: qty 1

## 2021-06-23 MED ORDER — TAMSULOSIN HCL 0.4 MG PO CAPS: 0.4000 mg | ORAL_CAPSULE | Freq: Every day | ORAL | 0 refills | Status: AC

## 2021-06-23 MED ORDER — ONDANSETRON 4 MG PO TBDP
4.0000 mg | ORAL_TABLET | Freq: Once | ORAL | Status: AC
  Administered 2021-06-23: 4 mg via ORAL
  Filled 2021-06-23: qty 1

## 2021-06-23 MED ORDER — ONDANSETRON 4 MG PO TBDP: 4.0000 mg | ORAL_TABLET | Freq: Three times a day (TID) | ORAL | 0 refills | Status: DC | PRN

## 2021-06-23 NOTE — ED Triage Notes (Signed)
Pt complains of sudden sharp pain in lower left abdominal area that radiates to her back. States pain started at 2am this morning w nausea and vomiting.

## 2021-06-23 NOTE — ED Provider Notes (Signed)
Blood pressure (!) 144/71, pulse 70, temperature 98.3 F (36.8 C), temperature source Oral, resp. rate 17, height 5' 4"  (1.626 m), weight 94.8 kg, SpO2 100 %.  Assuming care from Dr. Sedonia Small.  In short, Kimberly Obrien is a 60 y.o. female with a chief complaint of Abdominal Pain .  Refer to the original H&P for additional details.  The current plan of care is to follow up on labs and imaging with left flank pain. Clinically suspect kidney stone.   EKG Interpretation  Date/Time:  Thursday June 23 2021 06:42:13 EDT Ventricular Rate:  71 PR Interval:  196 QRS Duration: 95 QT Interval:  400 QTC Calculation: 435 R Axis:   33 Text Interpretation: Sinus rhythm Baseline wander in lead(s) V2 Confirmed by Nanda Quinton [phone]) on 06/23/2021 8:07:35 AM       09:10 AM  Ureteral stone on CT. Left 3 mm stone at the [facility]. UA pending. On reassessment the patient's pain is much better controlled. Plain for home pain mgmt and Urology follow up.     Margette Fast, MD 06/30/21 1816

## 2021-06-23 NOTE — ED Notes (Signed)
Patient transported to CT 

## 2021-06-23 NOTE — ED Provider Notes (Signed)
[facility] Emergency Department Provider Note MRN:  [id number]  Arrival date & time: 06/23/21     Chief Complaint   Abdominal Pain   History of Present Illness   Kimberly Obrien is a 60 y.o. year-old female with a history of COPD, cirrhosis presenting to the ED with chief complaint of abdominal pain.  Location: Left flank and left lower quadrant Duration: Few hours Onset: Sudden Timing: Constant Description: Sharp dull dull ache Severity: Severe Exacerbating/Alleviating Factors: None Associated Symptoms: Nausea vomiting Pertinent Negatives: No fever, no chest pain or shortness of breath, no hematuria or dysuria  Additional History: Had a UTI last week  Review of Systems  A complete 10 system review of systems was obtained and all systems are negative except as noted in the HPI and PMH.   Patient's Health History    Past Medical History:  Diagnosis Date   Anemia    Anticoagulation monitoring by pharmacist    Arthritis    Asthma    Blood transfusion without reported diagnosis    Cataract    Cirrhosis ([place])    COPD (chronic obstructive pulmonary disease) ([place])    Diabetes mellitus without complication (HCC)    Dyspnea    exertion   Elevated hemoglobin A1c 03/01/2018   Family history of Hodgkin's lymphoma    Family history of leukemia    Family history of lung cancer    Family history of stomach cancer    Family history of uterine cancer    History of colon polyps    History of pulmonary embolism 02/04/2013   Hyperlipidemia    NASH (nonalcoholic steatohepatitis)    Neuromuscular disorder ([place])    back surgery   PE (pulmonary embolism)    Sleep apnea     Past Surgical History:  Procedure Laterality Date   ABDOMINAL HYSTERECTOMY     BREAST BIOPSY     CATARACT EXTRACTION, BILATERAL Bilateral    CHOLECYSTECTOMY     COLONOSCOPY WITH PROPOFOL N/A 05/07/2017   Procedure: COLONOSCOPY WITH PROPOFOL;  Surgeon: Laurence Spates, MD;   Location: [facility] ENDOSCOPY;  Service: Endoscopy;  Laterality: N/A;   ESOPHAGOGASTRODUODENOSCOPY (EGD) WITH PROPOFOL N/A 05/07/2017   Procedure: ESOPHAGOGASTRODUODENOSCOPY (EGD) WITH PROPOFOL;  Surgeon: Laurence Spates, MD;  Location: [facility] ENDOSCOPY;  Service: Endoscopy;  Laterality: N/A;   EYE SURGERY     Cataracts   INCONTINENCE SURGERY  2009   IR TRANSCATHETER BX  03/15/2018   IR VENOGRAM HEPATIC W HEMODYNAMIC EVALUATION  03/15/2018   LEFT AND RIGHT HEART CATHETERIZATION WITH CORONARY ANGIOGRAM N/A 05/13/2014   Procedure: LEFT AND RIGHT HEART CATHETERIZATION WITH CORONARY ANGIOGRAM;  Surgeon: [name], MD;  Location: [facility] CATH LAB;  Service: Cardiovascular;  Laterality: N/A;   SPINE SURGERY     L5 S1 fusion   TUBAL LIGATION     WRIST SURGERY Right    Had surgery twice to shave bone for blood circulation improvement.    Family History  Problem Relation Age of Onset   Cirrhosis Mother        NASH   Irritable bowel syndrome Mother    Hyperlipidemia Father    Heart failure Father    Bipolar disorder Father    Heart disease Father    Obesity Daughter    Endometrial cancer Daughter 43   Hypertension Brother    Colon polyps Sister        unknown number but fewer than patient   Irritable bowel syndrome Sister    Heart  failure Paternal Grandfather    Stomach cancer Maternal Uncle        dx 40s/50s   Cancer Paternal Aunt        NOS   Cancer Paternal Uncle        NOS   Lung cancer Maternal Uncle        dx 40s/50s, smoker   Cancer Maternal Uncle        NOS   Cancer Paternal Aunt        NOS   Leukemia Cousin        dx 87s, died 2 weeks after diagnosis (maternal first cousin)   Cancer Cousin        NOS (paternal first cousin)   Hodgkin's lymphoma Other 55       4th degree relative (mother's half-sibling's son)   Heart attack Neg Hx    Miscarriages / Stillbirths Neg Hx    Colon cancer Neg Hx    Esophageal cancer Neg Hx    Rectal cancer Neg Hx     Social History    Socioeconomic History   Marital status: Married    Spouse name: Shanon Brow   Number of children: 4   Years of education: GED   Highest education level: GED or equivalent  Occupational History   Occupation: Unemployed  Tobacco Use   Smoking status: Former    Packs/day: 2.00    Years: 50.00    Pack years: 100.00    Types: Cigarettes    Start date: 08/22/1977    Quit date: 04/21/2012    Years since quitting: 9.1   Smokeless tobacco: Never  Vaping Use   Vaping Use: Never used  Substance and Sexual Activity   Alcohol use: No   Drug use: No   Sexual activity: Not Currently    Birth control/protection: Surgical    Comment: tubal and [facility]  Other Topics Concern   Not on file  Social History Narrative   Not on file   Social Determinants of Health   Financial Resource Strain: Low Risk    Difficulty of Paying Living Expenses: Not very hard  Food Insecurity: No Food Insecurity   Worried About Charity fundraiser in the Last Year: Never true   Ran Out of Food in the Last Year: Never true  Transportation Needs: No Transportation Needs   Lack of Transportation (Medical): No   Lack of Transportation (Non-Medical): No  Physical Activity: Inactive   Days of Exercise per Week: 0 days   Minutes of Exercise per Session: 0 min  Stress: Stress Concern Present   Feeling of Stress : Rather much  Social Connections: Socially Isolated   Frequency of Communication with Friends and Family: Once a week   Frequency of Social Gatherings with Friends and Family: Once a week   Attends Religious Services: Never   Marine scientist or Organizations: No   Attends Music therapist: Never   Marital Status: Married  Human resources officer Violence: Not At Risk   Fear of Current or Ex-Partner: No   Emotionally Abused: No   Physically Abused: No   Sexually Abused: No     Physical Exam   Vitals:   06/23/21 0641  BP: (!) 144/71  Pulse: 70  Resp: 17  Temp: 98.3 F (36.8 C)  SpO2: 100%     CONSTITUTIONAL: Well-appearing, in moderate distress due to pain NEURO:  Alert and oriented x 3, no focal deficits EYES:  eyes equal and reactive ENT/NECK:  no LAD, no JVD CARDIO: Regular rate, well-perfused, normal S1 and S2 PULM:  CTAB no wheezing or rhonchi GI/GU:  normal bowel sounds, non-distended, non-tender MSK/SPINE:  No gross deformities, no edema SKIN:  no rash, atraumatic PSYCH:  Appropriate speech and behavior  *Additional and/or pertinent findings included in MDM below  Diagnostic and Interventional Summary    EKG Interpretation  Date/Time:    Ventricular Rate:    PR Interval:    QRS Duration:   QT Interval:    QTC Calculation:   R Axis:     Text Interpretation:         Labs Reviewed  CBC  COMPREHENSIVE METABOLIC PANEL  LIPASE, BLOOD  URINALYSIS, ROUTINE W REFLEX MICROSCOPIC    CT RENAL STONE STUDY    (Results Pending)    Medications  sodium chloride 0.9 % bolus 1,000 mL (has no administration in time range)  HYDROmorphone (DILAUDID) injection 1 mg (has no administration in time range)     Procedures  /  Critical Care Procedures  ED Course and Medical Decision Making  I have reviewed the triage vital signs, the nursing notes, and pertinent available records from the EMR.  Listed above are laboratory and imaging tests that I personally ordered, reviewed, and interpreted and then considered in my medical decision making (see below for details).  Suspect kidney stone versus pyelonephritis versus diverticulitis, CT is pending.  Signed out to oncoming provider at shift change.       Barth Kirks. Sedonia Small, MD [place] [email or web address]  Final Clinical Impressions(s) / ED Diagnoses     ICD-10-CM   1. Flank pain  R10.9       ED Discharge Orders     None        Discharge Instructions Discussed with and Provided to Patient:   Discharge Instructions   None       Maudie Flakes,  MD 06/23/21 [phone]

## 2021-06-23 NOTE — ED Notes (Signed)
Advised patient we needed urine specimen.  Cannot use restroom at this time - will advise when she is able to.

## 2021-06-23 NOTE — Discharge Instructions (Signed)
You have been seen in the Emergency Department (ED) today for pain that we believe based on your workup, is caused by kidney stones.  As we have discussed, please drink plenty of fluids.  Please make a follow up appointment with the physician(s) listed elsewhere in this documentation.  You may take pain medication as needed but ONLY as prescribed.  Please also take your prescribed Flomax daily.  We also recommend that you take over-the-counter ibuprofen regularly according to label instructions over the next 5 days.  Take it with meals to minimize stomach discomfort.  Please see your doctor as soon as possible as stones may take 1-3 weeks to pass and you may require additional care or medications.  Do not drink alcohol, drive or participate in any other potentially dangerous activities while taking opiate pain medication as it may make you sleepy. Do not take this medication with any other sedating medications, either prescription or over-the-counter. If you were prescribed Percocet or Vicodin, do not take these with acetaminophen (Tylenol) as it is already contained within these medications.  Return to the Emergency Department (ED) or call your doctor if you have any worsening pain, fever, painful urination, are unable to urinate, or develop other symptoms that concern you.

## 2021-06-24 ENCOUNTER — Other Ambulatory Visit: Payer: Self-pay

## 2021-06-24 ENCOUNTER — Encounter (HOSPITAL_COMMUNITY): Payer: Self-pay

## 2021-06-24 ENCOUNTER — Telehealth: Payer: Self-pay | Admitting: Adult Health

## 2021-06-24 ENCOUNTER — Emergency Department (HOSPITAL_COMMUNITY)
Admission: EM | Admit: 2021-06-24 | Discharge: 2021-06-24 | Disposition: A | Discharge status: Home / Self Care | Payer: Medicare Other | Attending: Emergency Medicine | Admitting: Emergency Medicine

## 2021-06-24 DIAGNOSIS — R1032 Left lower quadrant pain: Secondary | ICD-10-CM | POA: Diagnosis present

## 2021-06-24 DIAGNOSIS — Z87891 Personal history of nicotine dependence: Secondary | ICD-10-CM | POA: Diagnosis not present

## 2021-06-24 DIAGNOSIS — E119 Type 2 diabetes mellitus without complications: Secondary | ICD-10-CM | POA: Diagnosis not present

## 2021-06-24 DIAGNOSIS — Z79899 Other long term (current) drug therapy: Secondary | ICD-10-CM | POA: Insufficient documentation

## 2021-06-24 DIAGNOSIS — J449 Chronic obstructive pulmonary disease, unspecified: Secondary | ICD-10-CM | POA: Diagnosis not present

## 2021-06-24 DIAGNOSIS — J45909 Unspecified asthma, uncomplicated: Secondary | ICD-10-CM | POA: Insufficient documentation

## 2021-06-24 DIAGNOSIS — N201 Calculus of ureter: Secondary | ICD-10-CM | POA: Insufficient documentation

## 2021-06-24 LAB — URINALYSIS, ROUTINE W REFLEX MICROSCOPIC
Bilirubin Urine: NEGATIVE
Glucose, UA: NEGATIVE mg/dL
Ketones, ur: 5 mg/dL — AB
Nitrite: NEGATIVE
Protein, ur: 30 mg/dL — AB
Specific Gravity, Urine: 1.03 (ref 1.005–1.030)
pH: 5 (ref 5.0–8.0)

## 2021-06-24 LAB — URINE CULTURE: Culture: NO GROWTH

## 2021-06-24 MED ORDER — KETOROLAC TROMETHAMINE 15 MG/ML IJ SOLN
15.0000 mg | Freq: Once | INTRAMUSCULAR | Status: AC
  Administered 2021-06-24: 15 mg via INTRAVENOUS
  Filled 2021-06-24: qty 1

## 2021-06-24 MED ORDER — MORPHINE SULFATE (PF) 4 MG/ML IV SOLN
4.0000 mg | Freq: Once | INTRAVENOUS | Status: AC
  Administered 2021-06-24: 4 mg via INTRAVENOUS
  Filled 2021-06-24: qty 1

## 2021-06-24 MED ORDER — SODIUM CHLORIDE 0.9 % IV BOLUS
1000.0000 mL | Freq: Once | INTRAVENOUS | Status: AC
  Administered 2021-06-24: 1000 mL via INTRAVENOUS

## 2021-06-24 MED ORDER — OXYCODONE-ACETAMINOPHEN 5-325 MG PO TABS
1.0000 | ORAL_TABLET | Freq: Three times a day (TID) | ORAL | 0 refills | Status: DC | PRN
Start: 2021-06-24 — End: 2021-07-06

## 2021-06-24 MED ORDER — HYDROCODONE-ACETAMINOPHEN 5-325 MG PO TABS
1.0000 | ORAL_TABLET | Freq: Four times a day (QID) | ORAL | 0 refills | Status: DC
Start: 2021-06-24 — End: 2021-06-24

## 2021-06-24 MED ORDER — HYDROMORPHONE HCL 1 MG/ML IJ SOLN
0.5000 mg | Freq: Once | INTRAMUSCULAR | Status: AC
  Administered 2021-06-24: 0.5 mg via INTRAVENOUS
  Filled 2021-06-24: qty 1

## 2021-06-24 MED ORDER — ONDANSETRON HCL 4 MG/2ML IJ SOLN
4.0000 mg | Freq: Once | INTRAMUSCULAR | Status: AC
  Administered 2021-06-24: 4 mg via INTRAVENOUS
  Filled 2021-06-24: qty 2

## 2021-06-24 NOTE — Telephone Encounter (Signed)
Kimberly Obrien said the pharmacy said insurance would not cover cleocin, so ask what cash price is.. she said she would

## 2021-06-24 NOTE — ED Provider Notes (Signed)
[facility] Provider Note   CSN: [id number] Arrival date & time: 06/24/21  1349     History Chief Complaint  Patient presents with   Flank Pain    Kimberly Obrien is a 60 y.o. female who presents to the emergency department with left-sided flank pain with radiation to the left abdomen has been worsening over the last several days.  She was seen evaluated in the emergency department yesterday and was diagnosed with a 3 mm kidney stone.  She was discharged with tramadol, Flomax, and Zofran.  Tramadol has offered no improvement on her pain.  She describes her pain as a sharp sensation and is waxing and waning since onset.  She rates her flank pain 10/10 in severity.  She reports associated nausea and vomiting but denies any fever, chills, chest pain, shortness of breath, diarrhea, dysuria, urinary frequency, and urinary urgency.   Flank Pain      Past Medical History:  Diagnosis Date   Anemia    Anticoagulation monitoring by pharmacist    Arthritis    Asthma    Blood transfusion without reported diagnosis    Cataract    Cirrhosis ([place])    COPD (chronic obstructive pulmonary disease) ([place])    Diabetes mellitus without complication (HCC)    Dyspnea    exertion   Elevated hemoglobin A1c 03/01/2018   Family history of Hodgkin's lymphoma    Family history of leukemia    Family history of lung cancer    Family history of stomach cancer    Family history of uterine cancer    History of colon polyps    History of pulmonary embolism 02/04/2013   Hyperlipidemia    NASH (nonalcoholic steatohepatitis)    Neuromuscular disorder ([place])    back surgery   PE (pulmonary embolism)    Sleep apnea     Patient Active Problem List   Diagnosis Date Noted   Cervicitis with nabothian cyst 06/21/2021   H/O vaginal bleeding 06/21/2021   History of colon polyps    Family history of uterine cancer    Family history of stomach cancer    Family history of lung cancer     Family history of Hodgkin's lymphoma    Family history of leukemia    Thrombocytopenia ([place])    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   OSA (obstructive sleep apnea) 05/22/2019   Iron deficiency anemia due to chronic blood loss 10/10/2017   Enterocolitis 10/17/2016   COPD (chronic obstructive pulmonary disease) ([place]) 09/23/2015   Autoimmune hepatitis ([place]) 09/23/2015   Irritable bowel syndrome (IBS) 07/19/2015   Tachycardia 12/04/2013   Fatty liver 05/25/2013   Vitamin D deficiency 02/04/2013   Chronic arthralgias of knees and hips 02/04/2013   Insomnia 02/04/2013   GAD (generalized anxiety disorder) 02/04/2013   HLD (hyperlipidemia) 02/04/2013    Past Surgical History:  Procedure Laterality Date   ABDOMINAL HYSTERECTOMY     BREAST BIOPSY     CATARACT EXTRACTION, BILATERAL Bilateral    CHOLECYSTECTOMY     COLONOSCOPY WITH PROPOFOL N/A 05/07/2017   Procedure: COLONOSCOPY WITH PROPOFOL;  Surgeon: Laurence Spates, MD;  Location: [facility] END[facility];  Service: Endoscopy;  Laterality: N/A;   ESOPHAGOGASTRODUODENOSCOPY (EGD) WITH PROPOFOL N/A 05/07/2017   Procedure: ESOPHAGOGASTRODUODENOSCOPY (EGD) WITH PROPOFOL;  Surgeon: Laurence Spates, MD;  Location: [facility];  Service: Endoscopy;  Laterality: N/A;   EYE SURGERY     Cataracts   INCONTINENCE SURGERY  2009   IR TRANSCATHETER BX  03/15/2018   IR VENOGRAM HEPATIC W HEMODYNAMIC EVALUATION  03/15/2018   LEFT AND RIGHT HEART CATHETERIZATION WITH CORONARY ANGIOGRAM N/A 05/13/2014   Procedure: LEFT AND RIGHT HEART CATHETERIZATION WITH CORONARY ANGIOGRAM;  Surgeon: [name], MD;  Location: [facility] CATH LAB;  Service: Cardiovascular;  Laterality: N/A;   SPINE SURGERY     L5 S1 fusion   TUBAL LIGATION     WRIST SURGERY Right    Had surgery twice to shave bone for blood circulation improvement.     OB History     Gravida  4   Para  4   Term  3   Preterm  1   AB      Living  4      SAB      IAB      Ectopic       Multiple      Live Births  4           Family History  Problem Relation Age of Onset   Cirrhosis Mother        NASH   Irritable bowel syndrome Mother    Hyperlipidemia Father    Heart failure Father    Bipolar disorder Father    Heart disease Father    Obesity Daughter    Endometrial cancer Daughter 9   Hypertension Brother    Colon polyps Sister        unknown number but fewer than patient   Irritable bowel syndrome Sister    Heart failure Paternal Grandfather    Stomach cancer Maternal Uncle        dx 67s/50s   Cancer Paternal Aunt        NOS   Cancer Paternal Uncle        NOS   Lung cancer Maternal Uncle        dx 40s/50s, smoker   Cancer Maternal Uncle        NOS   Cancer Paternal Aunt        NOS   Leukemia Cousin        dx 6s, died 2 weeks after diagnosis (maternal first cousin)   Cancer Cousin        NOS (paternal first cousin)   Hodgkin's lymphoma Other 5       4th degree relative (mother's half-sibling's son)   Heart attack Neg Hx    Miscarriages / Stillbirths Neg Hx    Colon cancer Neg Hx    Esophageal cancer Neg Hx    Rectal cancer Neg Hx     Social History   Tobacco Use   Smoking status: Former    Packs/day: 2.00    Years: 50.00    Pack years: 100.00    Types: Cigarettes    Start date: 08/22/1977    Quit date: 04/21/2012    Years since quitting: 9.1   Smokeless tobacco: Never  Vaping Use   Vaping Use: Never used  Substance Use Topics   Alcohol use: No   Drug use: No    Home Medications Prior to Admission medications   Medication Sig Start Date End Date Taking? Authorizing Provider  HYDROcodone-acetaminophen (NORCO/VICODIN) 5-325 MG tablet Take 1 tablet by mouth every 6 (six) hours. 06/24/21  Yes Raul Del, Rhyan Radler M, PA-C  acetaminophen (TYLENOL) 500 MG tablet Take 500 mg by mouth every 6 (six) hours as needed.    [provider]  albuterol (VENTOLIN HFA) 108 (90 Base) MCG/ACT inhaler Inhale 2 puffs into  the lungs every 6 (six)  hours as needed for wheezing or shortness of breath. 11/17/20   Claretta Fraise, MD  Blood Glucose Monitoring Suppl (ONETOUCH VERIO REFLECT) w/Device KIT Test BS twice daily Dx e11.9 05/21/20   Claretta Fraise, MD  cephALEXin (KEFLEX) 500 MG capsule Take 500 mg by mouth 4 (four) times daily.    [provider]  clindamycin (CLEOCIN) 100 MG vaginal suppository Place 1 suppository (100 mg total) vaginally at bedtime. 06/21/21   Estill Dooms, NP  Eluxadoline (VIBERZI) 100 MG TABS Take 1 tablet (100 mg total) by mouth 2 (two) times daily with a meal. Patient not taking: Reported on 06/21/2021 05/19/21   Claretta Fraise, MD  fexofenadine (ALLEGRA) 180 MG tablet Take 1 tablet (180 mg total) by mouth daily. For allergy symptoms Patient not taking: Reported on 06/21/2021 11/17/20   Claretta Fraise, MD  fluticasone [facility]) 50 MCG/ACT nasal spray Place 2 sprays into both nostrils daily. 05/19/21   Claretta Fraise, MD  furosemide (LASIX) 20 MG tablet Take 1 tablet (20 mg total) by mouth as needed. 07/12/20   [name], MD  ipratropium-albuterol (DUONEB) 0.5-2.5 (3) MG/3ML SOLN Take 3 mLs by nebulization every 6 (six) hours as needed. 11/17/20   Claretta Fraise, MD  Lancets (ONETOUCH DELICA PLUS [id number]) [place] TWICE  DAILY AND AS NEEDED Dx E11.9 07/23/20   Claretta Fraise, MD  metFORMIN (GLUCOPHAGE-XR) 750 MG 24 hr tablet Take 1 tablet (750 mg total) by mouth daily with breakfast. 11/17/20   Claretta Fraise, MD  montelukast (SINGULAIR) 10 MG tablet Take 1 tablet (10 mg total) by mouth at bedtime. For allergy Patient not taking: Reported on 06/21/2021 11/22/20   Claretta Fraise, MD  ondansetron (ZOFRAN ODT) 4 MG disintegrating tablet Take 1 tablet (4 mg total) by mouth every 8 (eight) hours as needed for nausea or vomiting. 06/23/21   Long, Wonda Olds, MD  ONETOUCH VERIO test strip TEST BLOOD SUGAR TWICE  DAILY 08/16/20   Claretta Fraise, MD  tamsulosin (FLOMAX) 0.4 MG CAPS capsule Take 1  capsule (0.4 mg total) by mouth daily for 7 days. 06/23/21 06/30/21  Long, Wonda Olds, MD  traMADol (ULTRAM) 50 MG tablet Take 1 tablet (50 mg total) by mouth every 6 (six) hours as needed for severe pain. 06/23/21   Long, Wonda Olds, MD  Vitamin D, Ergocalciferol, (DRISDOL) 1.25 MG (50000 UNIT) CAPS capsule Take 1 capsule (50,000 Units total) by mouth every 7 (seven) days. 05/20/21   Claretta Fraise, MD    Allergies    Aciphex Graciella [place] sodium], Fluconazole, Lansoprazole, Septra [sulfamethoxazole-trimethoprim], Sucralfate, Vancomycin, Claritin [loratadine], Crestor [rosuvastatin], Doxycycline, Ibuprofen, Lipitor [atorvastatin], Macrobid [nitrofurantoin macrocrystal], Metoprolol, Metronidazole, Nsaids, Omeprazole, and Rabeprazole  Review of Systems   Review of Systems  Genitourinary:  Positive for flank pain.  All other systems reviewed and are negative.  Physical Exam Updated Vital Signs BP (!) 100/50 (BP Location: Left Arm)   Pulse 60   Temp 99 F (37.2 C) (Oral)   Resp 14   Ht 5' 4"  (1.626 m)   Wt 94.8 kg   SpO2 95%   BMI 35.87 kg/m   Physical Exam Vitals and nursing note reviewed.  Constitutional:      General: She is not in acute distress.    Appearance: Normal appearance.  HENT:     Head: Normocephalic and atraumatic.  Eyes:     General:        Right eye: No discharge.  Left eye: No discharge.  Cardiovascular:     Comments: Regular rate and rhythm.  S1/S2 are distinct without any evidence of murmur, rubs, or gallops.  Radial pulses are 2+ bilaterally.  Dorsalis pedis pulses are 2+ bilaterally.  No evidence of pedal edema. Pulmonary:     Comments: Clear to auscultation bilaterally.  Normal effort.  No respiratory distress.  No evidence of wheezes, rales, or rhonchi heard throughout. Abdominal:     General: Abdomen is flat. Bowel sounds are normal. There is no distension.     Tenderness: There is no guarding or rebound.     Comments: Positive left CVA tenderness and  left lower quadrant abdominal tenderness.  Musculoskeletal:        General: Normal range of motion.     Cervical back: Neck supple.  Skin:    General: Skin is warm and dry.     Findings: No rash.  Neurological:     General: No focal deficit present.     Mental Status: She is alert.  Psychiatric:        Mood and Affect: Mood normal.        Behavior: Behavior normal.    ED Results / Procedures / Treatments   Labs (all labs ordered are listed, but only abnormal results are displayed) Labs Reviewed  URINALYSIS, ROUTINE W REFLEX MICROSCOPIC - Abnormal; Notable for the following components:      Result Value   Color, Urine AMBER (*)    APPearance CLOUDY (*)    Hgb urine dipstick LARGE (*)    Ketones, ur 5 (*)    Protein, ur 30 (*)    Leukocytes,Ua MODERATE (*)    Bacteria, UA RARE (*)    All other components within normal limits    EKG None  Radiology CT RENAL STONE STUDY  Result Date: 06/23/2021 CLINICAL DATA:  Left flank pain EXAM: CT ABDOMEN AND PELVIS WITHOUT CONTRAST TECHNIQUE: Multidetector CT imaging of the abdomen and pelvis was performed following the standard protocol without IV contrast. COMPARISON:  CT abdomen and pelvis 05/24/2020 FINDINGS: Lower chest: Subsegmental atelectatic changes in the lower lungs. Two stable pulmonary nodules in the right lower lobe measuring up to 4 mm anteriorly. Hepatobiliary: Liver is nodular and in homogeneous consistent with cirrhosis. No focal mass appreciated. Gallbladder is surgically absent. No biliary ductal dilatation identified. Pancreas: Unremarkable. No pancreatic ductal dilatation or surrounding inflammatory changes. Spleen: Enlarged measuring up to 17.2 cm in length. Adrenals/Urinary Tract: Adrenal glands appear normal. Right kidney is unremarkable with no nephrolithiasis or hydronephrosis. 3 mm obstructing calculus in the proximal left ureter near the UPJ, causing mild hydronephrosis and moderate perinephric edema. Urinary bladder  appears normal. Stomach/Bowel: Tiny hiatal hernia. No bowel obstruction, free air or pneumatosis. No bowel wall edema identified. Appendix is normal. Vascular/Lymphatic: Stable mildly enlarged lymph nodes in the upper abdomen measuring up to 1 cm in short axis in the portacaval region. Aortic atherosclerotic plaques. Reproductive: Uterus and bilateral adnexa are unremarkable. Other: No abdominal wall hernia or abnormality. No abdominopelvic ascites. Musculoskeletal: Degenerative and postsurgical changes of the lumbar spine. No suspicious bony lesions identified. IMPRESSION: 1. 3 mm obstructing calculus in the proximal left ureter near the UPJ. Mild left hydronephrosis and moderate perinephric fat stranding. 2. Hepatic cirrhosis. Marked splenomegaly suggesting portal hypertension. 3. Other chronic findings as described. Electronically Signed   By: Ofilia Neas M.D.   On: 06/23/2021 08:27    Procedures Procedures   Medications Ordered in ED Medications  sodium  chloride 0.9 % bolus 1,000 mL (0 mLs Intravenous Stopped 06/24/21 1651)  morphine 4 MG/ML injection 4 mg (4 mg Intravenous Given 06/24/21 1506)  ondansetron (ZOFRAN) injection 4 mg (4 mg Intravenous Given 06/24/21 1504)  HYDROmorphone (DILAUDID) injection 0.5 mg (0.5 mg Intravenous Given 06/24/21 1546)  ketorolac (TORADOL) 15 MG/ML injection 15 mg (15 mg Intravenous Given 06/24/21 1604)    ED Course  I have reviewed the triage vital signs and the nursing notes.  Pertinent labs & imaging results that were available during my care of the patient were reviewed by me and considered in my medical decision making (see chart for details).    MDM Rules/Calculators/A&P                          Kimberly Obrien is a 60 y.o. female who presents the emergency department for for evaluation of ureterolithiasis.  Do not feel that reimaging is necessary at this time.  She was diagnosed with ureterolithiasis last night.  Patient was mainly here for pain  control as tramadol that she was given was not working.  Repeat urinalysis showed large amount of blood.  Again this is likely secondary to her passing the kidney stone.  Patient's pain was adequately controlled with morphine, Toradol, and Dilaudid in the department today.  Nausea and vomiting was controlled with Zofran.  Patient is feeling much improved after a liter of fluid.  Patient did have episodes of hypoxia into the 80s while she was sleeping.  She was also consistently around 94% on room air.  Patient does have a history of COPD and does not require any oxygen at home. This is likely the cause of her lower O2 saturations.  Patient did not feel any more short of breath during her stay.  I will also discharge her with a short course of Norco to get her through the next couple of days.  She is safe for discharge.   Final Clinical Impression(s) / ED Diagnoses Final diagnoses:  Ureterolithiasis    Rx / DC Orders ED Discharge Orders          Ordered    HYDROcodone-acetaminophen (NORCO/VICODIN) 5-325 MG tablet  Every 6 hours        06/24/21 1713             Myna Bright Gervais, [place] 06/24/21 1716    Carmin Muskrat, MD 06/29/21 1032

## 2021-06-24 NOTE — Telephone Encounter (Signed)
Patient called stating that the pharmacy states that her insurance will not cover the clindamycin Patient is asking if there is something else that you can give her and send to [facility]

## 2021-06-24 NOTE — Discharge Instructions (Addendum)
Please return to the emergency room if you experience worsening pain, intractable nausea vomiting, trouble urinating, fevers, or any other concerns you might have.  Please start taking Tylenol on a scheduled basis for pain.  Norco for breakthrough pain.  Please follow-up with your primary care doctor within the next week.

## 2021-06-24 NOTE — ED Notes (Addendum)
Patient oxygen saturation dropped to 82% on RA.  Placed the patient on 2 lpm via [place] and made provider aware. Provider came to bedside.

## 2021-06-24 NOTE — ED Triage Notes (Signed)
Recently diagnosed with kidney stone. States that pain has not improved. Complaints of nausea/vomiting. Pain to left flank, abdomen.

## 2021-06-30 ENCOUNTER — Other Ambulatory Visit: Payer: Self-pay

## 2021-06-30 ENCOUNTER — Ambulatory Visit: Payer: Medicare Other | Admitting: Adult Health

## 2021-06-30 ENCOUNTER — Other Ambulatory Visit: Payer: Self-pay | Admitting: Family Medicine

## 2021-06-30 ENCOUNTER — Encounter: Payer: Self-pay | Admitting: Urology

## 2021-06-30 ENCOUNTER — Ambulatory Visit: Payer: Medicare Other | Admitting: Urology

## 2021-06-30 VITALS — BP 137/80 | HR 105 | Temp 98.6°F

## 2021-06-30 DIAGNOSIS — N201 Calculus of ureter: Secondary | ICD-10-CM

## 2021-06-30 DIAGNOSIS — R8290 Unspecified abnormal findings in urine: Secondary | ICD-10-CM | POA: Diagnosis not present

## 2021-06-30 LAB — URINALYSIS, ROUTINE W REFLEX MICROSCOPIC
Bilirubin, UA: NEGATIVE
Glucose, UA: NEGATIVE
Nitrite, UA: NEGATIVE
Protein,UA: NEGATIVE
Specific Gravity, UA: 1.02 (ref 1.005–1.030)
Urobilinogen, Ur: 1 mg/dL (ref 0.2–1.0)
pH, UA: 6 (ref 5.0–7.5)

## 2021-06-30 LAB — MICROSCOPIC EXAMINATION: Renal Epithel, UA: NONE SEEN /hpf

## 2021-06-30 NOTE — Progress Notes (Signed)
Urological Symptom Review  Patient is experiencing the following symptoms: Hard to postpone urination Get up at night to urinate Stream starts and stops Trouble starting stream Blood in urine Urinary tract infection Painful intercourse Weak stream Kidney stones  Review of Systems  Gastrointestinal (upper)  : Nausea  Gastrointestinal (lower) : Diarrhea Constipation  Constitutional : Fatigue  Skin: Skin rash/lesion  Eyes: Blurred vision  Ear/Nose/Throat : Sinus problems  Hematologic/Lymphatic: Easy bruising  Cardiovascular : Negative for cardiovascular symptoms  Respiratory : Shortness of breath  Endocrine: Negative for endocrine symptoms  Musculoskeletal: Back pain Joint pain  Neurological: Headaches  Psychologic: Negative for psychiatric symptoms

## 2021-06-30 NOTE — Progress Notes (Signed)
Assessment: 1. Ureteral calculus, left   2. Abnormal urine findings      Plan: I personally reviewed the patient's records from her PCP as well as her ER visit.  I personally reviewed the CT study from 06/23/2021 showing a 3 mm calculus in the proximal left ureter with obstructive changes. She appears to have passed the left ureteral calculus. Stone sent for analysis. Stone prevention discussed and information provided. Urine culture sent today for evaluation of her abnormal urinalysis. Return to office in 1 month  Chief Complaint:  Chief Complaint  Patient presents with   Nephrolithiasis     History of Present Illness:  Kimberly Obrien is a 60 y.o. year old female who is seen in consultation from Claretta Fraise, MD for evaluation of a left ureteral calculus.  She had onset of frequency, pelvic pressure, and gross hematuria approximately 2 weeks ago.  Urinalysis showed >30 RBCs with moderate bacteria.  Urine culture grew 10-20 5K mixed flora.  She was treated with antibiotics for presumed UTI.  She subsequently developed left-sided flank pain with radiation to the left abdomen.  She was seen in the emergency room on 06/23/2021.  UA positive for blood.  Urine culture showed no growth.  CT urogram showed a 3 mm calculus in the left proximal ureter near the UPJ with mild hydronephrosis.  No other stones were seen.  She was treated with pain medication and Flomax.  She reports passing a stone approximately 2 days later.  Her flank pain and abdominal pain improved.  She has had ongoing issues with constipation.  She is having more normal bowel movements now.  No prior history of kidney stones.  No current dysuria or gross hematuria.   Past Medical History:  Past Medical History:  Diagnosis Date   Anemia    Anticoagulation monitoring by pharmacist    Arthritis    Asthma    Blood transfusion without reported diagnosis    Cataract    Cirrhosis ([place])    COPD (chronic obstructive  pulmonary disease) ([place])    Diabetes mellitus without complication (HCC)    Dyspnea    exertion   Elevated hemoglobin A1c 03/01/2018   Family history of Hodgkin's lymphoma    Family history of leukemia    Family history of lung cancer    Family history of stomach cancer    Family history of uterine cancer    History of colon polyps    History of pulmonary embolism 02/04/2013   Hyperlipidemia    NASH (nonalcoholic steatohepatitis)    Neuromuscular disorder ([place])    back surgery   PE (pulmonary embolism)    Sleep apnea     Past Surgical History:  Past Surgical History:  Procedure Laterality Date   ABDOMINAL HYSTERECTOMY     BREAST BIOPSY     CATARACT EXTRACTION, BILATERAL Bilateral    CHOLECYSTECTOMY     COLONOSCOPY WITH PROPOFOL N/A 05/07/2017   Procedure: COLONOSCOPY WITH PROPOFOL;  Surgeon: Laurence Spates, MD;  Location: [facility];  Service: Endoscopy;  Laterality: N/A;   ESOPHAGOGASTRODUODENOSCOPY (EGD) WITH PROPOFOL N/A 05/07/2017   Procedure: ESOPHAGOGASTRODUODENOSCOPY (EGD) WITH PROPOFOL;  Surgeon: Laurence Spates, MD;  Location: [facility];  Service: Endoscopy;  Laterality: N/A;   EYE SURGERY     Cataracts   INCONTINENCE SURGERY  2009   IR TRANSCATHETER BX  03/15/2018   IR VENOGRAM HEPATIC W HEMODYNAMIC EVALUATION  03/15/2018   LEFT AND RIGHT HEART CATHETERIZATION WITH CORONARY ANGIOGRAM N/A 05/13/2014  Procedure: LEFT AND RIGHT HEART CATHETERIZATION WITH CORONARY ANGIOGRAM;  Surgeon: [name], MD;  Location: [facility] CATH LAB;  Service: Cardiovascular;  Laterality: N/A;   SPINE SURGERY     L5 S1 fusion   TUBAL LIGATION     WRIST SURGERY Right    Had surgery twice to shave bone for blood circulation improvement.    Allergies:  Allergies  Allergen Reactions   Aciphex [Rabeprazole Sodium] Rash   Fluconazole Rash and Hives   Lansoprazole Hives   Septra [Sulfamethoxazole-Trimethoprim] Nausea And Vomiting    "made me bleed"   Sucralfate Nausea Only    Vancomycin Diarrhea   Claritin [Loratadine] Other (See Comments)    Tired    Crestor [Rosuvastatin] Other (See Comments)    Elevated blood glucose and abdominal pain   Doxycycline Rash   Ibuprofen Other (See Comments)    Upset stomach   Lipitor [Atorvastatin] Other (See Comments)    Myalgias, leg pain.  Problems with pancreas, sugars went up also.   Macrobid [Nitrofurantoin Macrocrystal] Nausea And Vomiting   Metoprolol Nausea And Vomiting   Metronidazole Other (See Comments) and Nausea Only    Dizziness, nausea, dry mouth and some shortness of breath   Nsaids Other (See Comments)    Upset stomach   Omeprazole     bloating   Rabeprazole Rash    Family History:  Family History  Problem Relation Age of Onset   Cirrhosis Mother        NASH   Irritable bowel syndrome Mother    Hyperlipidemia Father    Heart failure Father    Bipolar disorder Father    Heart disease Father    Obesity Daughter    Endometrial cancer Daughter 57   Hypertension Brother    Colon polyps Sister        unknown number but fewer than patient   Irritable bowel syndrome Sister    Heart failure Paternal Grandfather    Stomach cancer Maternal Uncle        dx 72s/50s   Cancer Paternal Aunt        NOS   Cancer Paternal Uncle        NOS   Lung cancer Maternal Uncle        dx 40s/50s, smoker   Cancer Maternal Uncle        NOS   Cancer Paternal Aunt        NOS   Leukemia Cousin        dx 39s, died 2 weeks after diagnosis (maternal first cousin)   Cancer Cousin        NOS (paternal first cousin)   Hodgkin's lymphoma Other 69       4th degree relative (mother's half-sibling's son)   Heart attack Neg Hx    Miscarriages / Stillbirths Neg Hx    Colon cancer Neg Hx    Esophageal cancer Neg Hx    Rectal cancer Neg Hx     Social History:  Social History   Tobacco Use   Smoking status: Former    Packs/day: 2.00    Years: 50.00    Pack years: 100.00    Types: Cigarettes    Start date: 08/22/1977     Quit date: 04/21/2012    Years since quitting: 9.1   Smokeless tobacco: Never  Vaping Use   Vaping Use: Never used  Substance Use Topics   Alcohol use: No   Drug use: No    Review of symptoms:  Constitutional:  Negative for unexplained weight loss, night sweats, fever, chills ENT:  Negative for nose bleeds, sinus pain, painful swallowing CV:  Negative for chest pain, shortness of breath, exercise intolerance, palpitations, loss of consciousness Resp:  Negative for cough, wheezing, shortness of breath GI:  Negative for vomiting, diarrhea, bloody stools GU:  Positives noted in HPI; otherwise negative for urinary incontinence Neuro:  Negative for seizures, poor balance, limb weakness, slurred speech Psych:  Negative for lack of energy, depression, anxiety Endocrine:  Negative for polydipsia, polyuria, symptoms of hypoglycemia (dizziness, hunger, sweating) Hematologic:  Negative for anemia, purpura, petechia, prolonged or excessive bleeding, use of anticoagulants  Allergic:  Negative for difficulty breathing or choking as a result of exposure to anything; no shellfish allergy; no allergic response (rash/itch) to materials, foods  Physical exam: BP 137/80   Pulse (!) 105   Temp 98.6 F (37 C)  GENERAL APPEARANCE:  Well appearing, well developed, well nourished, NAD HEENT: Atraumatic, Normocephalic, oropharynx clear. NECK: Supple without lymphadenopathy or thyromegaly. LUNGS: Clear to auscultation bilaterally. HEART: Regular Rate and Rhythm without murmurs, gallops, or rubs. ABDOMEN: Soft, non-tender, No Masses. EXTREMITIES: Moves all extremities well.  Without clubbing, cyanosis, or edema. NEUROLOGIC:  Alert and oriented x 3, normal gait, CN II-XII grossly intact.  MENTAL STATUS:  Appropriate. BACK:  Non-tender to palpation.  No CVAT SKIN:  Warm, dry and intact.    Results: U/A: 11-30 WBC, 0-2 RBC, mod bacteria, nitrite negative

## 2021-07-02 LAB — URINE CULTURE

## 2021-07-06 ENCOUNTER — Other Ambulatory Visit: Payer: Medicare Other

## 2021-07-06 ENCOUNTER — Ambulatory Visit: Payer: Medicare Other | Admitting: Nurse Practitioner

## 2021-07-06 ENCOUNTER — Encounter: Payer: Self-pay | Admitting: Nurse Practitioner

## 2021-07-06 VITALS — BP 114/66 | HR 72 | Ht 64.0 in | Wt 204.2 lb

## 2021-07-06 DIAGNOSIS — R1011 Right upper quadrant pain: Secondary | ICD-10-CM

## 2021-07-06 DIAGNOSIS — K7460 Unspecified cirrhosis of liver: Secondary | ICD-10-CM

## 2021-07-06 DIAGNOSIS — R194 Change in bowel habit: Secondary | ICD-10-CM

## 2021-07-06 DIAGNOSIS — R195 Other fecal abnormalities: Secondary | ICD-10-CM | POA: Diagnosis not present

## 2021-07-06 LAB — CALCULI, WITH PHOTOGRAPH (CLINICAL LAB): Weight Calculi: 11 mg

## 2021-07-06 NOTE — Progress Notes (Signed)
Agree with assessment and plan as outlined.   Nevin Bloodgood she is due for both EGD and colonoscopy, so if negative for C Diff agree she should be scheduled for both in the [place]. Thanks

## 2021-07-06 NOTE — Patient Instructions (Signed)
If you are age 60 or younger, your body mass index should be between 19-25. Your Body mass index is 35.06 kg/m. If this is out of the aformentioned range listed, please consider follow up with your Primary Care Provider.  ________________________________________________________  The [facility] providers would like to encourage you to use [facility] to communicate with providers for non-urgent requests or questions.  Due to long hold times on the telephone, sending your provider a message by [facility] may be a faster and more efficient way to get a response.  Please allow 48 business hours for a response.  Please remember that this is for non-urgent requests.  _______________________________________________________  Your provider has requested that you go to the basement level for lab work before leaving today. Press "B" on the elevator. The lab is located at the first door on the left as you exit the elevator.  You have been scheduled to follow up with Dr. Havery Moros on September 13, 2021 at 10:30 am.  Thank you for entrusting me with your care and choosing [facility]  Tye Savoy, NP-C

## 2021-07-06 NOTE — Progress Notes (Signed)
ASSESSMENT AND PLAN    # 60 yo female with worsening of her chronic loose stool.  Typically she frequently has urgent, postprandial loose stool but over the last several days describes more frank diarrhea. She had a course of antibiotics in late September and has a  history of C-diff.  --Check stool for C-diff  --If negative then can try imodium as needed --It looks like she is due for polyp surveillance colonoscopy next month.  Consider random colon biopsies  (to rule out microscopic colitis) at time of upcoming colonoscopy, I did not schedule the colonoscopy today because C. difficile needs to be excluded  # Recurrent adenomatous colon polyps. Due for polyp surveillance colonoscopy next month.  First need to rule out C. 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 Appears to be compensated.  --No focal liver lesions on [place] Sept 2022 ( done since she was last here) --AFP 3.8 in May 2022 --Not on a beta blocker. I believe she is due for 1 year surveillance EGD next month.  This can be done at time of colonoscopy   # Multiple medication allergies   HISTORY OF PRESENT ILLNESS    Chief Complaint : diarrhea  Kimberly Obrien is a 60 y.o. female known to Dr. Havery Moros with a past medical history of recurrent colon polyps, chronic loose stool , NASH cirrhosis ,  IDA, kidney stones, DM, COPD , pulmonary embolism, grade 1 diastolic dysfunction,  cholecystectomy .  Additional medical history as listed in [place] .   Patient is followed by Dr. Havery Moros for NASH cirrhosis.  She was previously followed by [facility].  She has also been seen at [facility]. There at one time was a question of autoimmune liver disease but her liver biopsy was more suggestive of.  Nash cirrhosis.  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  Dr. Havery Moros injected Lidocaine and Kenalog.  She later called back, the pain had not improved.  At some point there was mention of maybe another injection.   Patient comes in today for evaluation of worsening diarrhea. She gives a several year history of urgent,  postprandial explosive diarrhea associated with abdominal pain which usually resolves with defecation.  She sometimes takes Pepto . These episodes would generally occur only a few times a week up until recently. She developed constipation earlier this month,  around the same time she had a 76m obstructing stone in her left ureter with mild hydronephrosis ( ED visit on 06/23/21).  She was given Flomax.  She went back to the ED the following day with ongoing pain.  Urine showed a large amount of blood suggesting passage of kidney stone .  She is being followed by Urology.  She took stool softeners and subsequently began having bowel movements which eventually turned into diarrhea.  The diarrhea has persisted for several days. She is afraid to leave her house. No recent black stools. Yesterday she had 5 loose BMs but sometimes having up to 10 a day. She also complains of excessive intestinal gas  06/23/21 T bili 2.6 Hgb 13.8, platelets 71  PREVIOUS ENDOSCOPIC EVALUATIONS / PERTINENT STUDIES:    EGD Dec 2021 for varies screening Esophagogastric landmarks identified. - 1 cm hiatal hernia. -  Small (< 5 mm) esophageal varices. - Normal esophagus otherwise - Portal hypertensive gastropathy. Biopsied to rule out H pylori. - A single gastric polyp. Biopsied. - Normal stomach otherwise - no gastric varices.   Colonoscopy Dec 2021- polyp surveillance One 5 mm polyp in the ascending colon, removed with a cold snare. Resected and retrieved. - Two 5 mm polyps at the hepatic flexure, removed with a cold snare. Resected and retrieved. - Five 4 to 8 mm  polyps in the transverse colon, removed with a cold snare. Resected and retrieved. - Two 4 to 5 mm polyps in the descending colon, removed with a cold snare. Resected and retrieved. - Four 3 to 4 mm polyps in the sigmoid colon, removed with a cold snare. Resected and retrieved. - Two 3 to 4 mm polyps in the rectum, removed with a cold snare. Resected and retrieved. - A few recently bleeding colonic angiodysplastic lesions. - Internal hemorrhoids. - The examination was otherwise normal. Surgical [P], stomach, gastric  - MILD CHRONIC GASTRITIS. MILD REACTIVE GASTROPATHY. - WARTHIN-STARRY STAIN IS NEGATIVE FOR HELICOBACTER PYLORI. 2. Surgical [P], stomach, gastric polyp (1) - HYPERPLASTIC POLYP. - NEGATIVE FOR GOBLET CELL METAPLASIA. 3. Surgical [P], colon, sigmoid, hepatic flexure, ascending, transverse, descending, rectal, polyps (16) - TUBULAR ADENOMA (X MULTIPLE). - NEGATIVE FOR HIGH GRADE DYSPLASIA.  Current Medications, Allergies, Past Medical History, Past Surgical History, Family History and Social History were reviewed in [employer] record.     Current Outpatient Medications  Medication Sig Dispense Refill   acetaminophen (TYLENOL) 500 MG tablet Take 500 mg by mouth every 6 (six) hours as needed.     albuterol (VENTOLIN HFA) 108 (90 Base) MCG/ACT inhaler Inhale 2 puffs into the lungs every 6 (six) hours as needed for wheezing or shortness of breath. 18 g 0   Blood Glucose Monitoring Suppl (ONETOUCH VERIO REFLECT) w/Device KIT Test BS twice daily Dx e11.9 1 kit 0   fluticasone (FLONASE) 50 MCG/ACT nasal spray Place 2 sprays into both nostrils daily. 16 g 11   furosemide (LASIX) 20 MG tablet Take 1 tablet (20 mg total) by mouth as needed. 30 tablet 3   glucose blood (ONETOUCH VERIO) test strip Test BS twice daily Dx e11.9 200 strip 3   ipratropium-albuterol (DUONEB) 0.5-2.5 (3) MG/3ML SOLN Take 3 mLs by nebulization every 6 (six) hours as needed. 360 mL 1    Lancets (ONETOUCH DELICA PLUS [id number]) MISC Test BS twice daily Dx e11.9 200 each 3   metFORMIN (GLUCOPHAGE-XR) 750 MG 24 hr tablet Take 1 tablet (750 mg total) by mouth daily with breakfast. 90 tablet 3   Vitamin D, Ergocalciferol, (DRISDOL) 1.25 MG (50000 UNIT) CAPS capsule Take 1 capsule (50,000 Units total) by mouth every 7 (seven) days. (Patient not taking: Reported on 07/06/2021) 13 capsule 3   No current facility-administered medications for this visit.    Review of Systems: No chest pain. No shortness of breath. No urinary complaints.   PHYSICAL EXAM :    Wt Readings from Last 3 Encounters:  06/24/21 208 lb 15.9 oz (94.8 kg)  06/23/21 209 lb (94.8 kg)  06/21/21 209 lb (94.8 kg)    BP 114/66   Pulse 72   Ht 5' 4"  (1.626 m)   Wt 204 lb 4 oz (92.6 kg)   SpO2 95%   BMI 35.06 kg/m  Constitutional:  female in no acute distress. Psychiatric: Pleasant. Normal mood and affect. Behavior is normal. EENT: Pupils normal.  Conjunctivae are  normal. No scleral icterus. Neck supple.  Cardiovascular: Normal rate, regular rhythm. No edema Pulmonary/chest: Effort normal and breath sounds normal. No wheezing, rales or rhonchi. Abdominal: Soft, nondistended, nontender. Bowel sounds active throughout. There are no masses palpable. No hepatomegaly. Neurological: Alert and oriented to person place and time. Skin: Skin is warm and dry. No rashes noted.  Tye Savoy, NP  07/06/2021, 11:25 AM

## 2021-07-07 ENCOUNTER — Telehealth: Payer: Self-pay | Admitting: *Deleted

## 2021-07-07 NOTE — Telephone Encounter (Signed)
Patient called her pharmacy to check on price of Clindamycin vaginal suppository.  Will Goodrx card price is still $200.

## 2021-07-08 MED ORDER — METRONIDAZOLE 0.75 % VA GEL: 1.0000 | Freq: Every day | VAGINAL | 0 refills | Status: DC

## 2021-07-08 NOTE — Telephone Encounter (Signed)
Pt can not afford clindamycin supp, she has allergic listed for metronidazole, but will try Metrogel , I don't it will bother her

## 2021-07-08 NOTE — Addendum Note (Signed)
Addended by: Derrek Monaco A on: 07/08/2021 09:59 AM   Modules accepted: Orders

## 2021-07-11 ENCOUNTER — Other Ambulatory Visit: Payer: Medicare Other

## 2021-07-11 DIAGNOSIS — K7460 Unspecified cirrhosis of liver: Secondary | ICD-10-CM

## 2021-07-11 DIAGNOSIS — R194 Change in bowel habit: Secondary | ICD-10-CM

## 2021-07-11 DIAGNOSIS — R195 Other fecal abnormalities: Secondary | ICD-10-CM | POA: Diagnosis not present

## 2021-07-11 DIAGNOSIS — R1011 Right upper quadrant pain: Secondary | ICD-10-CM | POA: Diagnosis not present

## 2021-07-12 LAB — CLOSTRIDIUM DIFFICILE BY PCR: Toxigenic C. Difficile by PCR: NEGATIVE

## 2021-07-13 ENCOUNTER — Telehealth: Payer: Self-pay

## 2021-07-13 ENCOUNTER — Ambulatory Visit: Payer: Medicare Other | Admitting: Adult Health

## 2021-07-13 ENCOUNTER — Other Ambulatory Visit: Payer: Self-pay | Admitting: *Deleted

## 2021-07-13 ENCOUNTER — Other Ambulatory Visit: Payer: Self-pay | Admitting: Family Medicine

## 2021-07-13 ENCOUNTER — Other Ambulatory Visit: Payer: Self-pay

## 2021-07-13 ENCOUNTER — Telehealth: Payer: Self-pay | Admitting: Family Medicine

## 2021-07-13 ENCOUNTER — Other Ambulatory Visit: Payer: Medicare Other

## 2021-07-13 DIAGNOSIS — N3090 Cystitis, unspecified without hematuria: Secondary | ICD-10-CM

## 2021-07-13 MED ORDER — AMOXICILLIN 500 MG PO CAPS
500.0000 mg | ORAL_CAPSULE | Freq: Three times a day (TID) | ORAL | 0 refills | Status: DC
Start: 2021-07-13 — End: 2021-09-02

## 2021-07-13 NOTE — Telephone Encounter (Signed)
Pt can't get metrogel right now due to cost, can wait.

## 2021-07-13 NOTE — Telephone Encounter (Signed)
Patient called office this am to ask about urine culture from 06/30/2021  Reviewed with patient urine culture was negative per Dr. Felipa Eth and he had notified patient as well through her my chart.  Patient reports she started with lower abdominal pain discomfort and urinary frequency. Offered patient a lab appointment. Patient will call her PCP first since she leaves over 30 mins away and will call back if PCP is unable to see patient today.

## 2021-07-13 NOTE — Telephone Encounter (Signed)
Patient is calling stating that the second medicine that was called in that it is sixty some dollars and using the good rx only drops it to around 50 dollars. Patient states that is just can't afford that right now and is asking if there is any thing else that can be sent or done

## 2021-07-17 LAB — URINE CULTURE

## 2021-07-18 LAB — URINALYSIS, COMPLETE
Bilirubin, UA: NEGATIVE
Glucose, UA: NEGATIVE
Nitrite, UA: NEGATIVE
Specific Gravity, UA: >1.03 — ABNORMAL HIGH (ref 1.005–1.030)
Urobilinogen, Ur: 2 mg/dL — ABNORMAL HIGH (ref 0.2–1.0)
pH, UA: 6 (ref 5.0–7.5)

## 2021-07-20 ENCOUNTER — Telehealth: Payer: Self-pay

## 2021-07-20 DIAGNOSIS — R197 Diarrhea, unspecified: Secondary | ICD-10-CM

## 2021-07-20 NOTE — Telephone Encounter (Signed)
Spoke with the patient about the EGD/colonoscopy to be done 09/06/21.  She tells me she is on Amoxicillin for UTI and is experiencing diarrhea and fecal incontinence. She asks if she can re-do the C Diff test or GI pathogen panel.

## 2021-07-22 NOTE — Telephone Encounter (Signed)
Pt states that the diarrhea is different and she isn't sure if it is due to the amoxicillin or not. Pt would like to come in for lab. Lab ordered and pt will come to collect the kit from the lab.

## 2021-07-28 ENCOUNTER — Encounter: Payer: Self-pay | Admitting: Urology

## 2021-07-28 ENCOUNTER — Ambulatory Visit: Payer: Medicare Other | Admitting: Urology

## 2021-07-28 ENCOUNTER — Other Ambulatory Visit: Payer: Self-pay

## 2021-07-28 VITALS — BP 132/81 | HR 81

## 2021-07-28 DIAGNOSIS — R300 Dysuria: Secondary | ICD-10-CM

## 2021-07-28 DIAGNOSIS — R310 Gross hematuria: Secondary | ICD-10-CM

## 2021-07-28 DIAGNOSIS — N201 Calculus of ureter: Secondary | ICD-10-CM

## 2021-07-28 HISTORY — DX: Calculus of ureter: N20.1

## 2021-07-28 LAB — URINALYSIS, ROUTINE W REFLEX MICROSCOPIC
Bilirubin, UA: NEGATIVE
Glucose, UA: NEGATIVE
Ketones, UA: NEGATIVE
Nitrite, UA: NEGATIVE
Protein,UA: NEGATIVE
Specific Gravity, UA: 1.025 (ref 1.005–1.030)
Urobilinogen, Ur: 4 mg/dL — ABNORMAL HIGH (ref 0.2–1.0)
pH, UA: 6 (ref 5.0–7.5)

## 2021-07-28 LAB — BLADDER SCAN AMB NON-IMAGING

## 2021-07-28 LAB — MICROSCOPIC EXAMINATION
Epithelial Cells (non renal): >10 /hpf — AB (ref 0–10)
Renal Epithel, UA: NONE SEEN /hpf

## 2021-07-28 NOTE — Progress Notes (Signed)
post void residual= 39m

## 2021-07-28 NOTE — Progress Notes (Signed)
[employer] mdx results for UTI collected per company policy.  Scheduled [employer] pick up #[id number]. Tracking [phone]

## 2021-07-28 NOTE — Patient Instructions (Signed)
FU in 3 weeks for UA and cystoscopy. Order placed for CT hematuria workup prior to cysto appt.

## 2021-07-28 NOTE — Progress Notes (Signed)
Assessment: 1. Gross hematuria   2. Ureteral calculus, left   3. Dysuria     Plan:   Pt is a 60YO female who presents with continued c/o urinary sxs and discomfort with hematuria. Discussed findings and plan at length with the patient. Today's labs and stone analysis results discussed. Previous studies/labs reviewed and compared. Today's urine sent for culture as well as Resolve Mdx culture.  We will consider antibiotic treatment pending those results.  No new prescriptions sent today.  BMP ordered in anticipation of CT evaluation.  She will return for cystoscopic evaluation following CT hematuria study in approximately 3 weeks.  She will continue occasional Tylenol for current back discomfort and is cautioned on this use due to her diagnosis of nonalcoholic cirrhosis.  If current symptoms worsen or if she develops fever, chills, vomiting, she will return or go to the emergency department as discussed.  Patient agrees conveys understanding.  All questions were answered.  Chief Complaint:  "I'm still having problems"   History of Present Illness:  Kimberly Obrien is a 60 y.o. year old female who is seen for further evaluation of a left ureteral calculus.  She had onset of frequency, pelvic pressure, and gross hematuria approximately 2 weeks prior to her evaluation on 06/30/2021.Marland Kitchen  Urinalysis showed >30 RBCs with moderate bacteria.  Urine culture grew 10-20 5K mixed flora.  She was treated with antibiotics for presumed UTI.  She subsequently developed left-sided flank pain with radiation to the left abdomen.  She was seen in the emergency room on 06/23/2021.  U/A positive for blood.  Urine culture showed no growth.  CT urogram showed a 3 mm calculus in the left proximal ureter near the UPJ with mild hydronephrosis.  No other stones were seen.  She was treated with pain medication and Flomax.  She reports passing a stone approximately 2 days later.  Her flank pain and abdominal pain improved.   She has had ongoing issues with constipation.  She is having more normal bowel movements now.  No prior history of kidney stones.  No current dysuria or gross hematuria. Urine culture from 06/30/2021 grew mixed flora.  07/28/21 Pt returns for follow up of ureteral stone passage. She continues to c/o frequency, dysuria, and gross hematuria as well as low back discomfort. Back pain relieved with positioning and occasional Tylenol. No fever, chills, nausea, vomiting. Mild abdominal pain LLQ and suprapubic. Some diarrhea that she associates with recent Rx of Amoxil Rxd by her primary care provider for treatment of abnormal urinalysis on 11/22.  Culture obtained on that visit also showed mixed flora.  Previous office notes, imaging studies, and labs reviewed/compared. Last CTAP was 10/21. No masses identifies. Sub-cm renal cyst noted. 11/10: Stone analysis reveals organic material not normally associated with human calculi.    Portions of the above documentation were copied from a prior visit for review purposes only.   Past Medical History:  Past Medical History:  Diagnosis Date   Anemia    Anticoagulation monitoring by pharmacist    Arthritis    Asthma    Blood transfusion without reported diagnosis    Cataract    Cirrhosis ([place])    COPD (chronic obstructive pulmonary disease) ([place])    Diabetes mellitus without complication (HCC)    Dyspnea    exertion   Elevated hemoglobin A1c 03/01/2018   Family history of Hodgkin's lymphoma    Family history of leukemia    Family history of lung cancer  Family history of stomach cancer    Family history of uterine cancer    History of colon polyps    History of pulmonary embolism 02/04/2013   Hyperlipidemia    NASH (nonalcoholic steatohepatitis)    Neuromuscular disorder ([place])    back surgery   PE (pulmonary embolism)    Sleep apnea     Past Surgical History:  Past Surgical History:  Procedure Laterality Date   ABDOMINAL HYSTERECTOMY      BREAST BIOPSY     CATARACT EXTRACTION, BILATERAL Bilateral    CHOLECYSTECTOMY     COLONOSCOPY WITH PROPOFOL N/A 05/07/2017   Procedure: COLONOSCOPY WITH PROPOFOL;  Surgeon: Laurence Spates, MD;  Location: [facility];  Service: Endoscopy;  Laterality: N/A;   ESOPHAGOGASTRODUODENOSCOPY (EGD) WITH PROPOFOL N/A 05/07/2017   Procedure: ESOPHAGOGASTRODUODENOSCOPY (EGD) WITH PROPOFOL;  Surgeon: Laurence Spates, MD;  Location: [facility];  Service: Endoscopy;  Laterality: N/A;   EYE SURGERY     Cataracts   INCONTINENCE SURGERY  2009   IR TRANSCATHETER BX  03/15/2018   IR VENOGRAM HEPATIC W HEMODYNAMIC EVALUATION  03/15/2018   LEFT AND RIGHT HEART CATHETERIZATION WITH CORONARY ANGIOGRAM N/A 05/13/2014   Procedure: LEFT AND RIGHT HEART CATHETERIZATION WITH CORONARY ANGIOGRAM;  Surgeon: [name], MD;  Location: [facility] CATH LAB;  Service: Cardiovascular;  Laterality: N/A;   SPINE SURGERY     L5 S1 fusion   TUBAL LIGATION     WRIST SURGERY Right    Had surgery twice to shave bone for blood circulation improvement.    Allergies:  Allergies  Allergen Reactions   Aciphex [Rabeprazole Sodium] Rash   Fluconazole Rash and Hives   Lansoprazole Hives   Septra [Sulfamethoxazole-Trimethoprim] Nausea And Vomiting    "made me bleed"   Sucralfate Nausea Only   Vancomycin Diarrhea   Claritin [Loratadine] Other (See Comments)    Tired    Crestor [Rosuvastatin] Other (See Comments)    Elevated blood glucose and abdominal pain   Doxycycline Rash   Ibuprofen Other (See Comments)    Upset stomach   Lipitor [Atorvastatin] Other (See Comments)    Myalgias, leg pain.  Problems with pancreas, sugars went up also.   Macrobid [Nitrofurantoin Macrocrystal] Nausea And Vomiting   Metoprolol Nausea And Vomiting   Metronidazole Other (See Comments) and Nausea Only    Dizziness, nausea, dry mouth and some shortness of breath   Nsaids Other (See Comments)    Upset stomach   Omeprazole     bloating    Rabeprazole Rash    Family History:  Family History  Problem Relation Age of Onset   Cirrhosis Mother        NASH   Irritable bowel syndrome Mother    Hyperlipidemia Father    Heart failure Father    Bipolar disorder Father    Heart disease Father    Obesity Daughter    Endometrial cancer Daughter 68   Hypertension Brother    Colon polyps Sister        unknown number but fewer than patient   Irritable bowel syndrome Sister    Heart failure Paternal Grandfather    Stomach cancer Maternal Uncle        dx 58s/50s   Cancer Paternal Aunt        NOS   Cancer Paternal Uncle        NOS   Lung cancer Maternal Uncle        dx 40s/50s, smoker   Cancer Maternal Uncle  NOS   Cancer Paternal Aunt        NOS   Leukemia Cousin        dx 6s, died 2 weeks after diagnosis (maternal first cousin)   Cancer Cousin        NOS (paternal first cousin)   Hodgkin's lymphoma Other 68       4th degree relative (mother's half-sibling's son)   Heart attack Neg Hx    Miscarriages / Stillbirths Neg Hx    Colon cancer Neg Hx    Esophageal cancer Neg Hx    Rectal cancer Neg Hx     Social History:  Social History   Tobacco Use   Smoking status: Former    Packs/day: 2.00    Years: 50.00    Pack years: 100.00    Types: Cigarettes    Start date: 08/22/1977    Quit date: 04/21/2012    Years since quitting: 9.2   Smokeless tobacco: Never  Vaping Use   Vaping Use: Never used  Substance Use Topics   Alcohol use: No   Drug use: No    ROS: Constitutional:  Negative for fever, chills, weight loss CV: Negative for chest pain, hypertension Respiratory:  Negative for shortness of breath, wheezing, sleep apnea, frequent cough GI:  Negative for nausea, vomiting, bloody stool, GERD  Physical exam: BP 132/81   Pulse 81  GENERAL APPEARANCE:  Well appearing, well developed, well nourished, NAD HEENT:  Atraumatic, normocephalic NECK:  Supple without lymphadenopathy or thyromegaly ABDOMEN:   Soft, mild tenderness suprapubic and LLL, no masses EXTREMITIES:  Moves all extremities well, without clubbing, cyanosis, or edema NEUROLOGIC:  Alert and oriented x 3, normal gait, CN II-XII grossly intact MENTAL STATUS:  appropriate BACK:  Tender R SI joint, No CVAT SKIN:  Warm, dry, and intact   Results: Stone analysis: organic material not normally associated with human calculi.  UA:  Results for orders placed or performed in visit on 07/28/21 (from the past 24 hour(s))  Urinalysis, Routine w reflex microscopic     Status: Abnormal   Collection Time: 07/28/21 11:31 AM  Result Value Ref Range   Specific Gravity, UA 1.025 1.005 - 1.030   pH, UA 6.0 5.0 - 7.5   Color, UA Amber (A) Yellow   Appearance Ur Clear Clear   Leukocytes,UA 2+ (A) Negative   Protein,UA Negative Negative/Trace   Glucose, UA Negative Negative   Ketones, UA Negative Negative   RBC, UA 2+ (A) Negative   Bilirubin, UA Negative Negative   Urobilinogen, Ur 4.0 (H) 0.2 - 1.0 mg/dL   Nitrite, UA Negative Negative   Microscopic Examination See below:    Narrative   Performed at:  [facility] [address], [place], [place]  [id number] Lab Director: Mina Marble MT, Phone:  [phone]  Microscopic Examination     Status: Abnormal   Collection Time: 07/28/21 11:31 AM   Urine  Result Value Ref Range   WBC, UA 11-30 (A) 0 - 5 /hpf   RBC 3-10 (A) 0 - 2 /hpf   Epithelial Cells (non renal) >10 (A) 0 - 10 /hpf   Renal Epithel, UA None seen None seen /hpf   Mucus, UA Present Not Estab.   Bacteria, UA Moderate (A) None seen/Few   Narrative   Performed at:  [facility] [address] [place], [place]  [id number] Lab Director: [place], Phone:  [phone]

## 2021-07-28 NOTE — Progress Notes (Signed)
Urological Symptom Review  Patient is experiencing the following symptoms: Frequent urination Hard to postpone urination Burning/pain with urination Trouble starting stream Blood in urine Urinary tract infection Vaginal bleeding (female only) Weak stream   Review of Systems  Gastrointestinal (upper)  : Negative for upper GI symptoms  Gastrointestinal (lower) : Diarrhea Constipation  Constitutional : Fatigue  Skin: Negative for skin symptoms  Eyes: Blurred vision Double vision  Ear/Nose/Throat : Sinus problems  Hematologic/Lymphatic: Easy bruising  Cardiovascular : Negative for cardiovascular symptoms  Respiratory : Shortness of breath  Endocrine: Negative for endocrine symptoms  Musculoskeletal: Back pain Joint pain  Neurological: Negative for neurological symptoms  Psychologic: Negative for psychiatric symptoms

## 2021-07-29 LAB — BASIC METABOLIC PANEL
BUN/Creatinine Ratio: 14 (ref 12–28)
BUN: 7 mg/dL — ABNORMAL LOW (ref 8–27)
CO2: 24 mmol/L (ref 20–29)
Calcium: 8.8 mg/dL (ref 8.7–10.3)
Chloride: 104 mmol/L (ref 96–106)
Creatinine, Ser: 0.51 mg/dL — ABNORMAL LOW (ref 0.57–1.00)
Glucose: 120 mg/dL — ABNORMAL HIGH (ref 70–99)
Potassium: 3.5 mmol/L (ref 3.5–5.2)
Sodium: 144 mmol/L (ref 134–144)
eGFR: 107 mL/min/{1.73_m2} (ref >59)

## 2021-08-02 ENCOUNTER — Other Ambulatory Visit: Payer: Self-pay | Admitting: Urology

## 2021-08-05 ENCOUNTER — Other Ambulatory Visit: Payer: Self-pay | Admitting: Urology

## 2021-08-11 ENCOUNTER — Encounter: Payer: Self-pay | Admitting: Gastroenterology

## 2021-08-16 ENCOUNTER — Telehealth: Payer: Self-pay

## 2021-08-16 NOTE — Telephone Encounter (Signed)
This patient was calling to find out what she needs done next.  Thinking she was to go for some tests.  Please advise.  Call back:  [phone]  Thanks, Helene Kelp

## 2021-08-16 NOTE — Telephone Encounter (Signed)
Hey,  Kimberly Obrien called today to ask about her CT. Does it need authorization or can she call and get it scheduled?

## 2021-08-17 ENCOUNTER — Encounter: Payer: Self-pay | Admitting: Family Medicine

## 2021-08-17 ENCOUNTER — Other Ambulatory Visit: Payer: Medicare Other

## 2021-08-17 ENCOUNTER — Other Ambulatory Visit: Payer: Self-pay | Admitting: *Deleted

## 2021-08-17 ENCOUNTER — Ambulatory Visit
Admission: RE | Admit: 2021-08-17 | Discharge: 2021-08-17 | Disposition: A | Discharge status: Home / Self Care | Payer: Medicare Other | Source: Ambulatory Visit | Attending: Family Medicine | Admitting: Family Medicine

## 2021-08-17 DIAGNOSIS — R300 Dysuria: Secondary | ICD-10-CM

## 2021-08-17 DIAGNOSIS — N3090 Cystitis, unspecified without hematuria: Secondary | ICD-10-CM

## 2021-08-17 DIAGNOSIS — Z1231 Encounter for screening mammogram for malignant neoplasm of breast: Secondary | ICD-10-CM

## 2021-08-18 ENCOUNTER — Ambulatory Visit (INDEPENDENT_AMBULATORY_CARE_PROVIDER_SITE_OTHER): Payer: Medicare Other | Admitting: Nurse Practitioner

## 2021-08-18 ENCOUNTER — Encounter: Payer: Self-pay | Admitting: Nurse Practitioner

## 2021-08-18 DIAGNOSIS — R399 Unspecified symptoms and signs involving the genitourinary system: Secondary | ICD-10-CM

## 2021-08-18 HISTORY — DX: Unspecified symptoms and signs involving the genitourinary system: R39.9

## 2021-08-18 LAB — URINALYSIS
Bilirubin, UA: NEGATIVE
Glucose, UA: NEGATIVE
Nitrite, UA: NEGATIVE
Specific Gravity, UA: >1.03 — ABNORMAL HIGH (ref 1.005–1.030)
Urobilinogen, Ur: 0.2 mg/dL (ref 0.2–1.0)
pH, UA: 5.5 (ref 5.0–7.5)

## 2021-08-18 MED ORDER — PHENAZOPYRIDINE HCL 95 MG PO TABS: 95.0000 mg | ORAL_TABLET | Freq: Three times a day (TID) | ORAL | 0 refills | Status: DC | PRN

## 2021-08-18 MED ORDER — NITROFURANTOIN MONOHYD MACRO 100 MG PO CAPS: 100.0000 mg | ORAL_CAPSULE | Freq: Two times a day (BID) | ORAL | 0 refills | Status: DC

## 2021-08-18 NOTE — Assessment & Plan Note (Signed)
°  UTI  vs  interstitial cystitis -urinalysis completed -abnormal urinalysis -Urine cultures completed results pending. -pyridium for pain -Nitrofurantoin 100 mg tablet by mouth twice daily. Precaution and education provided -I provided education to patient that nitrofurantoin is listed as an allergy.  Patient reports she never reported nitrofurantoin as an allergy and is able to take it at this time.  Patient will notify office with any new symptoms or allergic reaction from nitrofurantoin.  Education provided over the telephone patient verbalized understanding. -Increase hydration, wipe from front to back. -All questions answered -follow up with unresolved symptoms

## 2021-08-18 NOTE — Progress Notes (Signed)
° °  Virtual Visit  Note Due to COVID-19 pandemic this visit was conducted virtually. This visit type was conducted due to national recommendations for restrictions regarding the COVID-19 Pandemic (e.g. social distancing, sheltering in place) in an effort to limit this patient's exposure and mitigate transmission in our community. All issues noted in this document were discussed and addressed.  A physical exam was not performed with this format.  I connected with Kimberly Obrien on 08/18/21 at 1:20 PM by telephone and verified that I am speaking with the correct person using two identifiers. Kimberly Obrien is currently located at home during visit. The provider, Ivy Lynn, NP is located in their office at time of visit.  I discussed the limitations, risks, security and privacy concerns of performing an evaluation and management service by telephone and the availability of in person appointments. I also discussed with the patient that there may be a patient responsible charge related to this service. The patient expressed understanding and agreed to proceed.   History and Present Illness:  Urinary Tract Infection  This is a new problem. The current episode started in the past 7 days. The problem has been gradually worsening. The quality of the pain is described as aching. There has been no fever. Associated symptoms include urgency. Pertinent negatives include no chills or nausea. She has tried nothing for the symptoms.     Review of Systems  Constitutional:  Negative for chills, fever and malaise/fatigue.  Gastrointestinal:  Positive for abdominal pain. Negative for nausea.       Abdominal pain  Genitourinary:  Positive for urgency.  Skin:  Negative for rash.    Observations/Objective: Televisit patient not in distress.  Assessment and Plan:  UTI  vs  interstitial cystitis -urinalysis completed -abnormal urinalysis -Urine cultures completed results pending. -pyridium for  pain -Nitrofurantoin 100 mg tablet by mouth twice daily. Precaution and education provided -I provided education to patient that nitrofurantoin is listed as an allergy.  Patient reports she never reported nitrofurantoin as an allergy and is able to take it at this time.  Patient will notify office with any new symptoms or allergic reaction from nitrofurantoin.  Education provided over the telephone patient verbalized understanding. -Increase hydration, wipe from front to back. -All questions answered -follow up with unresolved symptoms   Follow Up Instructions: Follow-up with worsening unresolved symptoms.    I discussed the assessment and treatment plan with the patient. The patient was provided an opportunity to ask questions and all were answered. The patient agreed with the plan and demonstrated an understanding of the instructions.   The patient was advised to call back or seek an in-person evaluation if the symptoms worsen or if the condition fails to improve as anticipated.  The above assessment and management plan was discussed with the patient. The patient verbalized understanding of and has agreed to the management plan. Patient is aware to call the clinic if symptoms persist or worsen. Patient is aware when to return to the clinic for a follow-up visit. Patient educated on when it is appropriate to go to the emergency department.   Time call ended: 1:31 p.m.  I provided 11 minutes of  non face-to-face time during this encounter.    Ivy Lynn, NP

## 2021-08-19 LAB — URINE CULTURE

## 2021-08-23 ENCOUNTER — Other Ambulatory Visit: Payer: Medicare Other | Admitting: Urology

## 2021-08-23 ENCOUNTER — Telehealth: Payer: Self-pay

## 2021-08-23 NOTE — Telephone Encounter (Signed)
She's on schedule 02/06

## 2021-08-23 NOTE — Telephone Encounter (Signed)
Patient called and notified of results. Patient states she went back to her PCP and was given antibiotics for painful urination.  Message sent to MD to be advised

## 2021-08-23 NOTE — Telephone Encounter (Signed)
-----   Message from Primus Bravo, MD sent at 08/23/2021  9:13 AM EST ----- I had not seen this. Please let patient know that the urine culture sent from our office did not show evidence of a UTI. Keep f/u as scheduled.   ----- Message ----- From: Dorisann Frames, RN Sent: 08/19/2021   2:12 PM EST To: Primus Bravo, MD  Hey,  Im not sure if you have already viewed this. Just making sure.  Thanks

## 2021-08-23 NOTE — Telephone Encounter (Signed)
CT 1/31 OV 02/06

## 2021-09-02 ENCOUNTER — Other Ambulatory Visit: Payer: Self-pay

## 2021-09-02 ENCOUNTER — Ambulatory Visit (AMBULATORY_SURGERY_CENTER): Payer: Self-pay | Admitting: *Deleted

## 2021-09-02 ENCOUNTER — Encounter: Payer: Self-pay | Admitting: Gastroenterology

## 2021-09-02 VITALS — Ht 64.0 in | Wt 204.0 lb

## 2021-09-02 DIAGNOSIS — R197 Diarrhea, unspecified: Secondary | ICD-10-CM

## 2021-09-02 DIAGNOSIS — Z86010 Personal history of colonic polyps: Secondary | ICD-10-CM

## 2021-09-02 DIAGNOSIS — I8500 Esophageal varices without bleeding: Secondary | ICD-10-CM

## 2021-09-02 MED ORDER — CLENPIQ 10-3.5-12 MG-GM -GM/160ML PO SOLN: 1.0000 | ORAL | 0 refills | Status: DC

## 2021-09-02 NOTE — Progress Notes (Signed)
No egg or soy allergy known to patient  No issues known to pt with past sedation with any surgeries or procedures but occ has low BP Patient denies ever being told they had issues or difficulty with intubation  No FH of Malignant Hyperthermia Pt is not on diet pills Pt is not on  home 02  Pt is not on blood thinners  Pt denies issues with constipation  No A fib or A flutter  Top plate of teeth  Her bottom teeth are loose, broken  per pt   Pt is fully vaccinated  for Covid   NO PA's for preps discussed with pt In [facility] today  Discussed with pt there will be an out-of-pocket cost for prep and that varies from $0 to 70 +  dollars - pt verbalized understanding   Due to the COVID-19 pandemic we are asking patients to follow certain guidelines in [facility] and the [place]   Pt aware of COVID protocols and [facility] guidelines  Pt encouraged to call with questions or issues.

## 2021-09-06 ENCOUNTER — Ambulatory Visit (AMBULATORY_SURGERY_CENTER): Payer: Medicare Other | Admitting: Gastroenterology

## 2021-09-06 ENCOUNTER — Encounter: Payer: Self-pay | Admitting: Gastroenterology

## 2021-09-06 VITALS — BP 110/47 | HR 72 | Temp 99.3°F | Resp 14 | Ht 64.0 in | Wt 204.0 lb

## 2021-09-06 DIAGNOSIS — R197 Diarrhea, unspecified: Secondary | ICD-10-CM | POA: Diagnosis not present

## 2021-09-06 DIAGNOSIS — I8500 Esophageal varices without bleeding: Secondary | ICD-10-CM | POA: Diagnosis not present

## 2021-09-06 DIAGNOSIS — K319 Disease of stomach and duodenum, unspecified: Secondary | ICD-10-CM | POA: Diagnosis not present

## 2021-09-06 DIAGNOSIS — E119 Type 2 diabetes mellitus without complications: Secondary | ICD-10-CM | POA: Diagnosis not present

## 2021-09-06 DIAGNOSIS — K31819 Angiodysplasia of stomach and duodenum without bleeding: Secondary | ICD-10-CM

## 2021-09-06 DIAGNOSIS — K449 Diaphragmatic hernia without obstruction or gangrene: Secondary | ICD-10-CM | POA: Diagnosis not present

## 2021-09-06 DIAGNOSIS — Z86010 Personal history of colonic polyps: Secondary | ICD-10-CM | POA: Diagnosis not present

## 2021-09-06 DIAGNOSIS — K766 Portal hypertension: Secondary | ICD-10-CM | POA: Diagnosis not present

## 2021-09-06 MED ORDER — SODIUM CHLORIDE 0.9 % IV SOLN: 500.0000 mL | Freq: Once | INTRAVENOUS | Status: DC

## 2021-09-06 NOTE — Progress Notes (Signed)
Called to room to assist during endoscopic procedure.  Patient ID and intended procedure confirmed with present staff. Received instructions for my participation in the procedure from the performing physician.  

## 2021-09-06 NOTE — Progress Notes (Signed)
Pt's states no medical or surgical changes since previsit or office visit. 

## 2021-09-06 NOTE — Progress Notes (Signed)
Pt in recovery with monitors in place, VSS. Report given to receiving RN. Bite guard was placed with pt awake to ensure comfort. No dental or soft tissue damage noted. 

## 2021-09-06 NOTE — Op Note (Signed)
[facility] Patient Name: Kimberly Obrien Procedure Date: 09/06/2021 4:11 PM MRN: [id number] Endoscopist: Remo Lipps P. Havery Moros , MD Age: 61 Referring MD:  Date of Birth: 12-20-1960 Gender: Female Account #: [id number] Procedure:                Upper GI endoscopy Indications:              Follow-up of esophageal varices (small varices                            noted one year ago on screening exam), history of                            cirrhosis Medicines:                Monitored Anesthesia Care Procedure:                Pre-Anesthesia Assessment:                           - Prior to the procedure, a History and Physical                            was performed, and patient medications and                            allergies were reviewed. The patient's tolerance of                            previous anesthesia was also reviewed. The risks                            and benefits of the procedure and the sedation                            options and risks were discussed with the patient.                            All questions were answered, and informed consent                            was obtained. Prior Anticoagulants: The patient has                            taken no previous anticoagulant or antiplatelet                            agents. ASA Grade Assessment: III - A patient with                            severe systemic disease. After reviewing the risks                            and benefits, the patient was deemed in  satisfactory condition to undergo the procedure.                           After obtaining informed consent, the endoscope was                            passed under direct vision. Throughout the                            procedure, the patient's blood pressure, pulse, and                            oxygen saturations were monitored continuously. The                            GIF [id number] #[id number] was introduced  through the                            mouth, and advanced to the second part of duodenum.                            The upper GI endoscopy was accomplished without                            difficulty. The patient tolerated the procedure                            well. Scope In: Scope Out: Findings:                 Esophagogastric landmarks were identified: the                            Z-line was found at 40 cm, the gastroesophageal                            junction was found at 40 cm and the upper extent of                            the gastric folds was found at 41 cm from the                            incisors.                           A 1 cm hiatal hernia was present.                           Small varices were found in the middle third of the                            esophagus and in the lower third of the esophagus,  unchanged from the prior exam. No high risk                            stigmata for bleeding.                           The exam of the esophagus was otherwise normal.                           Portal hypertensive gastropathy was found in the                            entire examined stomach (prior biopsies on last                            exam ruled out H pylori - no further biopsies taken                            today)                           The exam of the stomach was otherwise normal. No                            gastric varices.                           A single diminutive angiodysplastic lesion without                            bleeding was found in the second portion of the                            duodenum.                           The exam of the duodenum was otherwise normal. Complications:            No immediate complications. Estimated blood loss:                            Minimal. Estimated Blood Loss:     Estimated blood loss was minimal. Impression:               - Esophagogastric landmarks  identified.                           - 1 cm hiatal hernia.                           - Small esophageal varices.                           - Portal hypertensive gastropathy.                           - Normal stomach otherwise.                           -  A single non-bleeding angiodysplastic lesion in                            the duodenum.                           - Normal duodenum otherwise. Recommendation:           - Patient has a contact number available for                            emergencies. The signs and symptoms of potential                            delayed complications were discussed with the                            patient. Return to normal activities tomorrow.                            Written discharge instructions were provided to the                            patient.                           - Resume previous diet.                           - Continue present medications.                           - Repeat upper endoscopy in 1 year for surveillance. Remo Lipps P. Brodin Gelpi, MD 09/06/2021 4:52:26 PM This report has been signed electronically.

## 2021-09-06 NOTE — Op Note (Signed)
[place] Patient Name: Kimberly Obrien Procedure Date: 09/06/2021 4:09 PM MRN: [id number] Endoscopist: Remo Lipps P. Havery Moros , MD Age: 61 Referring MD:  Date of Birth: 1961/01/28 Gender: Female Account #: [id number] Procedure:                Colonoscopy Indications:              High risk colon cancer surveillance: Personal                            history of colonic polyps (16 adenomas removed                            07/2020) Medicines:                Monitored Anesthesia Care Procedure:                Pre-Anesthesia Assessment:                           - Prior to the procedure, a History and Physical                            was performed, and patient medications and                            allergies were reviewed. The patient's tolerance of                            previous anesthesia was also reviewed. The risks                            and benefits of the procedure and the sedation                            options and risks were discussed with the patient.                            All questions were answered, and informed consent                            was obtained. Prior Anticoagulants: The patient has                            taken no previous anticoagulant or antiplatelet                            agents. ASA Grade Assessment: III - A patient with                            severe systemic disease. After reviewing the risks                            and benefits, the patient was deemed in  satisfactory condition to undergo the procedure.                           After obtaining informed consent, the colonoscope                            was passed under direct vision. Throughout the                            procedure, the patient's blood pressure, pulse, and                            oxygen saturations were monitored continuously. The                            Olympus Colonoscope [id number] was introduced  through                            the anus and advanced to the the cecum, identified                            by appendiceal orifice and ileocecal valve. The                            colonoscopy was performed without difficulty. The                            patient tolerated the procedure well. The quality                            of the bowel preparation was adequate. The                            ileocecal valve, appendiceal orifice, and rectum                            were photographed. Scope In: 4:22:41 PM Scope Out: 4:40:15 PM Scope Withdrawal Time: 0 hours 14 minutes 16 seconds  Total Procedure Duration: 0 hours 17 minutes 34 seconds  Findings:                 The perianal and digital rectal examinations were                            normal.                           A few small angiodysplastic lesions were found in                            the rectum, in the sigmoid colon, in the transverse                            colon and in the ascending colon.  Internal hemorrhoids were found during retroflexion.                           The exam was otherwise without abnormality.                           Biopsies for histology were taken with a cold                            forceps from the right colon, left colon and                            transverse colon for evaluation of microscopic                            colitis given patient's incidental complaint of                            persistent loose stools. Complications:            No immediate complications. Estimated blood loss:                            Minimal. Estimated Blood Loss:     Estimated blood loss was minimal. Impression:               - A few colonic angiodysplastic lesions.                           - Internal hemorrhoids.                           - The examination was otherwise normal.                           - Biopsies were taken with a cold forceps from the                             right colon, left colon and transverse colon for                            evaluation of microscopic colitis.                           - No polyps Recommendation:           - Patient has a contact number available for                            emergencies. The signs and symptoms of potential                            delayed complications were discussed with the                            patient. Return to normal activities tomorrow.  Written discharge instructions were provided to the                            patient.                           - Resume previous diet.                           - Continue present medications.                           - Await pathology results.                           - Repeat colonoscopy in 3 years for surveillance                            given history of numerous adenomas (16) removed one                            year ago [place]. Jequan Shahin, MD 09/06/2021 4:46:28 PM This report has been signed electronically.

## 2021-09-06 NOTE — Patient Instructions (Signed)
Handout about Gastritis given   YOU HAD AN ENDOSCOPIC PROCEDURE TODAY AT [place]:   Refer to the procedure report that was given to you for any specific questions about what was found during the examination.  If the procedure report does not answer your questions, please call your gastroenterologist to clarify.  If you requested that your care partner not be given the details of your procedure findings, then the procedure report has been included in a sealed envelope for you to review at your convenience later.  YOU SHOULD EXPECT: Some feelings of bloating in the abdomen. Passage of more gas than usual.  Walking can help get rid of the air that was put into your GI tract during the procedure and reduce the bloating. If you had a lower endoscopy (such as a colonoscopy or flexible sigmoidoscopy) you may notice spotting of blood in your stool or on the toilet paper. If you underwent a bowel prep for your procedure, you may not have a normal bowel movement for a few days.  Please Note:  You might notice some irritation and congestion in your nose or some drainage.  This is from the oxygen used during your procedure.  There is no need for concern and it should clear up in a day or so.  SYMPTOMS TO REPORT IMMEDIATELY:  Following lower endoscopy (colonoscopy or flexible sigmoidoscopy):  Excessive amounts of blood in the stool  Significant tenderness or worsening of abdominal pains  Swelling of the abdomen that is new, acute  Fever of 100F or higher  Following upper endoscopy (EGD)  Vomiting of blood or coffee ground material  New chest pain or pain under the shoulder blades  Painful or persistently difficult swallowing  New shortness of breath  Fever of 100F or higher  Black, tarry-looking stools  For urgent or emergent issues, a gastroenterologist can be reached at any hour by calling [phone]. Do not use MyChart messaging for urgent concerns.    DIET:  We do  recommend a small meal at first, but then you may proceed to your regular diet.  Drink plenty of fluids but you should avoid alcoholic beverages for 24 hours.  ACTIVITY:  You should plan to take it easy for the rest of today and you should NOT DRIVE or use heavy machinery until tomorrow (because of the sedation medicines used during the test).    FOLLOW UP: Our staff will call the number listed on your records 48-72 hours following your procedure to check on you and address any questions or concerns that you may have regarding the information given to you following your procedure. If we do not reach you, we will leave a message.  We will attempt to reach you two times.  During this call, we will ask if you have developed any symptoms of COVID 19. If you develop any symptoms (ie: fever, flu-like symptoms, shortness of breath, cough etc.) before then, please call [phone].  If you test positive for Covid 19 in the 2 weeks post procedure, please call and report this information to [place].    If any biopsies were taken you will be contacted by phone or by letter within the next 1-3 weeks.  Please call us at [phone] if you have not heard about the biopsies in 3 weeks.    SIGNATURES/CONFIDENTIALITY: You and/or your care partner have signed paperwork which will be entered into your electronic medical record.  These signatures attest to the fact that that  the information above on your After Visit Summary has been reviewed and is understood.  Full responsibility of the confidentiality of this discharge information lies with you and/or your care-partner.

## 2021-09-06 NOTE — Progress Notes (Signed)
[facility] History and Physical   Primary Care Physician:  Claretta Fraise, MD   Reason for Procedure:   Surveillance of varices / history of colon polyps  Plan:    EGD and colonoscopy     HPI: Kimberly Obrien is a 61 y.o. female  here for colonoscopy surveillance - 48 adenomas removed 07/2020, also with small esophageal varices at that time. History of cirrhosis. Patient has some ongoing loose stools which bother her.  Otherwise feels at baseline without any cardiopulmonary symptoms today.   Past Medical History:  Diagnosis Date   Anemia    Anticoagulation monitoring by pharmacist    Arthritis    Asthma    Blood transfusion without reported diagnosis    Cataract    removed both eyes   Cirrhosis ([place])    COPD (chronic obstructive pulmonary disease) ([place])    Diabetes mellitus without complication (HCC)    Dyspnea    exertion   Elevated hemoglobin A1c 03/01/2018   Family history of Hodgkin's lymphoma    Family history of leukemia    Family history of lung cancer    Family history of stomach cancer    Family history of uterine cancer    GERD (gastroesophageal reflux disease)    mild- diet related   Heart murmur    History of colon polyps    History of pulmonary embolism 02/04/2013   Hyperlipidemia    controlled   NASH (nonalcoholic steatohepatitis)    Neuromuscular disorder ([place])    back surgery   PE (pulmonary embolism)    Sleep apnea    no cpap    Past Surgical History:  Procedure Laterality Date   ABDOMINAL HYSTERECTOMY  2009   BREAST BIOPSY Left    CATARACT EXTRACTION, BILATERAL Bilateral    CHOLECYSTECTOMY     COLONOSCOPY WITH PROPOFOL N/A 05/07/2017   Procedure: COLONOSCOPY WITH PROPOFOL;  Surgeon: Laurence Spates, MD;  Location: [facility];  Service: Endoscopy;  Laterality: N/A;   ESOPHAGOGASTRODUODENOSCOPY (EGD) WITH PROPOFOL N/A 05/07/2017   Procedure: ESOPHAGOGASTRODUODENOSCOPY (EGD) WITH PROPOFOL;  Surgeon: Laurence Spates, MD;   Location: [facility];  Service: Endoscopy;  Laterality: N/A;   INCONTINENCE SURGERY  2009   with hysterectomy   IR TRANSCATHETER BX  03/15/2018   IR VENOGRAM HEPATIC W HEMODYNAMIC EVALUATION  03/15/2018   LEFT AND RIGHT HEART CATHETERIZATION WITH CORONARY ANGIOGRAM N/A 05/13/2014   Procedure: LEFT AND RIGHT HEART CATHETERIZATION WITH CORONARY ANGIOGRAM;  Surgeon: [name], MD;  Location: [facility] CATH LAB;  Service: Cardiovascular;  Laterality: N/A;   SPINE SURGERY     L5 S1 fusion x4   TUBAL LIGATION     WRIST SURGERY Right    Had surgery twice to shave bone for blood circulation improvement.    Prior to Admission medications   Medication Sig Start Date End Date Taking? Authorizing Provider  acetaminophen (TYLENOL) 500 MG tablet Take 500 mg by mouth every 6 (six) hours as needed.   Yes [provider]  Blood Glucose Monitoring Suppl (ONETOUCH VERIO REFLECT) w/Device KIT Test BS twice daily Dx e11.9 05/21/20  Yes Stacks, Cletus Gash, MD  glucose blood (ONETOUCH VERIO) test strip Test BS twice daily Dx e11.9 07/01/21  Yes Claretta Fraise, MD  metFORMIN (GLUCOPHAGE-XR) 750 MG 24 hr tablet Take 1 tablet (750 mg total) by mouth daily with breakfast. 11/17/20  Yes Claretta Fraise, MD  albuterol (VENTOLIN HFA) 108 (90 Base) MCG/ACT inhaler Inhale 2 puffs into the lungs every 6 (six) hours as  needed for wheezing or shortness of breath. 11/17/20   Claretta Fraise, MD  fluticasone (FLONASE) 50 MCG/ACT nasal spray Place 2 sprays into both nostrils daily. 05/19/21   Claretta Fraise, MD  furosemide (LASIX) 20 MG tablet Take 1 tablet (20 mg total) by mouth as needed. 07/12/20   [name], MD  ipratropium-albuterol (DUONEB) 0.5-2.5 (3) MG/3ML SOLN Take 3 mLs by nebulization every 6 (six) hours as needed. 11/17/20   Claretta Fraise, MD  Lancets [facility] DELICA PLUS [id number]) MISC Test BS twice daily Dx e11.9 07/01/21   Claretta Fraise, MD  Vitamin D, Ergocalciferol, (DRISDOL) 1.25 MG  (50000 UNIT) CAPS capsule Take 1 capsule (50,000 Units total) by mouth every 7 (seven) days. Patient not taking: Reported on 07/06/2021 05/20/21   Claretta Fraise, MD    Current Outpatient Medications  Medication Sig Dispense Refill   acetaminophen (TYLENOL) 500 MG tablet Take 500 mg by mouth every 6 (six) hours as needed.     Blood Glucose Monitoring Suppl (ONETOUCH VERIO REFLECT) w/Device KIT Test BS twice daily Dx e11.9 1 kit 0   glucose blood (ONETOUCH VERIO) test strip Test BS twice daily Dx e11.9 200 strip 3   metFORMIN (GLUCOPHAGE-XR) 750 MG 24 hr tablet Take 1 tablet (750 mg total) by mouth daily with breakfast. 90 tablet 3   albuterol (VENTOLIN HFA) 108 (90 Base) MCG/ACT inhaler Inhale 2 puffs into the lungs every 6 (six) hours as needed for wheezing or shortness of breath. 18 g 0   fluticasone (FLONASE) 50 MCG/ACT nasal spray Place 2 sprays into both nostrils daily. 16 g 11   furosemide (LASIX) 20 MG tablet Take 1 tablet (20 mg total) by mouth as needed. 30 tablet 3   ipratropium-albuterol (DUONEB) 0.5-2.5 (3) MG/3ML SOLN Take 3 mLs by nebulization every 6 (six) hours as needed. 360 mL 1   Lancets (ONETOUCH DELICA PLUS [id number]) MISC Test BS twice daily Dx e11.9 200 each 3   Vitamin D, Ergocalciferol, (DRISDOL) 1.25 MG (50000 UNIT) CAPS capsule Take 1 capsule (50,000 Units total) by mouth every 7 (seven) days. (Patient not taking: Reported on 07/06/2021) 13 capsule 3   Current Facility-Administered Medications  Medication Dose Route Frequency Provider Last Rate Last Admin   0.9 %  sodium chloride infusion  500 mL Intravenous Once Yetta Flock, MD        Allergies as of 09/06/2021 - Review Complete 09/06/2021  Allergen Reaction Noted   Aciphex [rabeprazole sodium] Rash 12/10/2012   Fluconazole Rash and Hives 12/10/2012   Lansoprazole Hives 08/26/2016   Septra [sulfamethoxazole-trimethoprim] Nausea And Vomiting 12/10/2012   Sucralfate Nausea Only 03/07/2017   Vancomycin  Diarrhea 09/08/2016   Claritin [loratadine] Other (See Comments) 12/10/2012   Crestor [rosuvastatin] Other (See Comments) 01/19/2014   Doxycycline Rash 12/10/2012   Ibuprofen Other (See Comments) 12/10/2012   Lipitor [atorvastatin] Other (See Comments) 03/23/2015   Macrobid [nitrofurantoin macrocrystal] Nausea And Vomiting 12/10/2012   Metoprolol Nausea And Vomiting 05/08/2014   Metronidazole Other (See Comments) and Nausea Only 08/24/2016   Nsaids Other (See Comments) 12/10/2012   Omeprazole  02/23/2016   Rabeprazole Rash     Family History  Problem Relation Age of Onset   Cirrhosis Mother        NASH   Irritable bowel syndrome Mother    Hyperlipidemia Father    Heart failure Father    Bipolar disorder Father    Heart disease Father    Obesity Daughter    Endometrial cancer Daughter 29  Hypertension Brother    Colon polyps Sister        unknown number but fewer than patient   Irritable bowel syndrome Sister    Heart failure Paternal Grandfather    Stomach cancer Maternal Uncle        dx 88s/50s   Cancer Paternal Aunt        NOS   Cancer Paternal Uncle        NOS   Lung cancer Maternal Uncle        dx 40s/50s, smoker   Cancer Maternal Uncle        NOS   Cancer Paternal Aunt        NOS   Leukemia Cousin        dx 43s, died 2 weeks after diagnosis (maternal first cousin)   Cancer Cousin        NOS (paternal first cousin)   Hodgkin's lymphoma Other 40       4th degree relative (mother's half-sibling's son)   Heart attack Neg Hx    Miscarriages / Stillbirths Neg Hx    Colon cancer Neg Hx    Esophageal cancer Neg Hx    Rectal cancer Neg Hx     Social History   Socioeconomic History   Marital status: Married    Spouse name: Shanon Brow   Number of children: 4   Years of education: GED   Highest education level: GED or equivalent  Occupational History   Occupation: Unemployed  Tobacco Use   Smoking status: Former    Packs/day: 2.00    Years: 50.00    Pack  years: 100.00    Types: Cigarettes    Start date: 08/22/1977    Quit date: 04/21/2012    Years since quitting: 9.3   Smokeless tobacco: Never  Vaping Use   Vaping Use: Never used  Substance and Sexual Activity   Alcohol use: No   Drug use: No   Sexual activity: Not Currently    Birth control/protection: Surgical    Comment: tubal and [facility]  Other Topics Concern   Not on file  Social History Narrative   Not on file   Social Determinants of Health   Financial Resource Strain: Low Risk    Difficulty of Paying Living Expenses: Not very hard  Food Insecurity: No Food Insecurity   Worried About Charity fundraiser in the Last Year: Never true   Ran Out of Food in the Last Year: Never true  Transportation Needs: No Transportation Needs   Lack of Transportation (Medical): No   Lack of Transportation (Non-Medical): No  Physical Activity: Inactive   Days of Exercise per Week: 0 days   Minutes of Exercise per Session: 0 min  Stress: Stress Concern Present   Feeling of Stress : Rather much  Social Connections: Socially Isolated   Frequency of Communication with Friends and Family: Once a week   Frequency of Social Gatherings with Friends and Family: Once a week   Attends Religious Services: Never   Marine scientist or Organizations: No   Attends Music therapist: Never   Marital Status: Married  Human resources officer Violence: Not At Risk   Fear of Current or Ex-Partner: No   Emotionally Abused: No   Physically Abused: No   Sexually Abused: No    Review of Systems: All other review of systems negative except as mentioned in the HPI.  Physical Exam: Vital signs BP 128/65    Pulse 70  Temp 99.3 F (37.4 C) (Temporal)    Ht _0  (1.626 m)    Wt 204 lb (92.5 kg)    SpO2 95%    BMI 35.02 kg/m   General:   Alert,  Well-developed, pleasant and cooperative in NAD Lungs:  Clear throughout to auscultation.   Heart:  Regular rate and rhythm Abdomen:  Soft, nontender  and nondistended.   Neuro/Psych:  Alert and cooperative. Normal mood and affect. A and O x 3  Jolly Mango, MD [facility]

## 2021-09-08 ENCOUNTER — Telehealth: Payer: Self-pay

## 2021-09-08 NOTE — Telephone Encounter (Signed)
°  Follow up Call-  Call back number 09/06/2021 07/29/2020 07/29/2020  Post procedure Call Back phone  # [phone]  Permission to leave phone message Yes Yes Yes  Some recent data might be hidden     Patient questions:  Do you have a fever, pain , or abdominal swelling? No. Pain Score  0 *  Have you tolerated food without any problems? Yes.    Have you been able to return to your normal activities? Yes.    Do you have any questions about your discharge instructions: Diet   No. Medications  No. Follow up visit  No.  Do you have questions or concerns about your Care? No.  Actions: * If pain score is 4 or above: No action needed, pain <4.  Have you developed a fever since your procedure? no  2.   Have you had an respiratory symptoms (SOB or cough) since your procedure? no  3.   Have you tested positive for COVID 19 since your procedure no  4.   Have you had any family members/close contacts diagnosed with the COVID 19 since your procedure?  no   If yes to any of these questions please route to Joylene John, RN and Joella Prince, RN

## 2021-09-12 ENCOUNTER — Ambulatory Visit: Payer: Medicare Other | Admitting: Adult Health

## 2021-09-12 ENCOUNTER — Other Ambulatory Visit: Payer: Self-pay

## 2021-09-12 ENCOUNTER — Other Ambulatory Visit (HOSPITAL_COMMUNITY)
Admission: RE | Admit: 2021-09-12 | Discharge: 2021-09-12 | Disposition: A | Discharge status: Home / Self Care | Payer: Medicare Other | Source: Ambulatory Visit | Attending: Adult Health | Admitting: Adult Health

## 2021-09-12 ENCOUNTER — Encounter: Payer: Self-pay | Admitting: Adult Health

## 2021-09-12 VITALS — BP 108/61 | HR 74 | Ht 64.0 in | Wt 205.0 lb

## 2021-09-12 DIAGNOSIS — N72 Inflammatory disease of cervix uteri: Secondary | ICD-10-CM

## 2021-09-12 DIAGNOSIS — N888 Other specified noninflammatory disorders of cervix uteri: Secondary | ICD-10-CM | POA: Diagnosis not present

## 2021-09-12 DIAGNOSIS — Z8742 Personal history of other diseases of the female genital tract: Secondary | ICD-10-CM | POA: Diagnosis not present

## 2021-09-12 DIAGNOSIS — Z124 Encounter for screening for malignant neoplasm of cervix: Secondary | ICD-10-CM | POA: Insufficient documentation

## 2021-09-12 DIAGNOSIS — L929 Granulomatous disorder of the skin and subcutaneous tissue, unspecified: Secondary | ICD-10-CM | POA: Diagnosis not present

## 2021-09-12 HISTORY — DX: Encounter for screening for malignant neoplasm of cervix: Z12.4

## 2021-09-12 NOTE — Progress Notes (Signed)
°  Subjective:     Patient ID: Kimberly Obrien, female   DOB: [birth year], 61 y.o.   MRN: [id number]  HPI Kimberly Obrien is a 61 year old white female, married, she is [facility], in complaining of having had more vaginal bleeding and thinks something popped had watery brown fluid too. Had colonospcy 09/06/21 and recent UTI.  She used Metrogel in the past.  Lab Results  Component Value Date   DIAGPAP  05/31/2021    - Negative for Intraepithelial Lesions or Malignancy (NILM)   DIAGPAP - Benign reactive/reparative changes 05/31/2021   PCP Dr Livia Snellen.  Review of Systems Vaginal bleeding Watery brown fluid Reviewed past medical,surgical, social and family history. Reviewed medications and allergies.     Objective:   Physical Exam BP 108/61 (BP Location: Left Arm, Patient Position: Sitting, Cuff Size: Large)    Pulse 74    Ht 5\' 4"  (1.626 m)    Wt 205 lb (93 kg)    BMI 35.19 kg/m      Skin warm and dry.Pelvic: external genitalia is normal in appearance no lesions, vagina: pale with loss of rugae,,urethra has no lesions or masses noted, cervix is friable and has nabothian cyst at 6-7 0'clock, red and irritated, biopsy taken, uterus: absent,adnexa: no masses or tenderness noted. Bladder is non tender and no masses felt.   Upstream - 09/12/21 1517       Pregnancy Intention Screening   Does the patient want to become pregnant in the next year? N/A    Does the patient's partner want to become pregnant in the next year? N/A    Would the patient like to discuss contraceptive options today? N/A      Contraception Wrap Up   Current Method Female Sterilization   hyst   End Method Female Sterilization   hyst   Contraception Counseling Provided No            Examination chaperoned by Levy Pupa LPN    Impression:  1. H/O vaginal bleeding Probably related to cervicitis and nabothian cyst   2. Cervicitis with nabothian cyst Biopsy sent will await results    Plan:     Follow up in 1 week with me

## 2021-09-12 NOTE — Addendum Note (Signed)
Addended by: Linton Rump on: 09/12/2021 04:26 PM   Modules accepted: Orders

## 2021-09-13 ENCOUNTER — Encounter: Payer: Self-pay | Admitting: Gastroenterology

## 2021-09-13 ENCOUNTER — Other Ambulatory Visit (INDEPENDENT_AMBULATORY_CARE_PROVIDER_SITE_OTHER): Payer: Medicare Other

## 2021-09-13 ENCOUNTER — Ambulatory Visit: Payer: Medicare Other | Admitting: Gastroenterology

## 2021-09-13 ENCOUNTER — Telehealth: Payer: Self-pay | Admitting: Gastroenterology

## 2021-09-13 VITALS — BP 122/62 | HR 70 | Ht 64.0 in | Wt 204.1 lb

## 2021-09-13 DIAGNOSIS — I8500 Esophageal varices without bleeding: Secondary | ICD-10-CM

## 2021-09-13 DIAGNOSIS — K529 Noninfective gastroenteritis and colitis, unspecified: Secondary | ICD-10-CM | POA: Diagnosis not present

## 2021-09-13 DIAGNOSIS — R1011 Right upper quadrant pain: Secondary | ICD-10-CM

## 2021-09-13 DIAGNOSIS — K7460 Unspecified cirrhosis of liver: Secondary | ICD-10-CM

## 2021-09-13 LAB — PROTIME-INR
INR: 1.3 ratio — ABNORMAL HIGH (ref 0.8–1.0)
Prothrombin Time: 14.1 s — ABNORMAL HIGH (ref 9.6–13.1)

## 2021-09-13 MED ORDER — LOPERAMIDE HCL 2 MG PO TABS: ORAL_TABLET | ORAL | 0 refills | Status: DC

## 2021-09-13 NOTE — Patient Instructions (Addendum)
If you are age 61 or older, your body mass index should be between 23-30. Your Body mass index is 35.04 kg/m. If this is out of the aforementioned range listed, please consider follow up with your Primary Care Provider.  If you are age 35 or younger, your body mass index should be between 19-25. Your Body mass index is 35.04 kg/m. If this is out of the aformentioned range listed, please consider follow up with your Primary Care Provider.   ________________________________________________________  The [facility] providers would like to encourage you to use [facility] to communicate with providers for non-urgent requests or questions.  Due to long hold times on the telephone, sending your provider a message by [facility] may be a faster and more efficient way to get a response.  Please allow 48 business hours for a response.  Please remember that this is for non-urgent requests.  _______________________________________________________   Please go to the lab in the basement of our building to have lab work done as you leave today. Hit "B" for basement when you get on the elevator.  When the doors open the lab is on your left.  We will call you with the results. Thank you.  You will be due for a recall upper Endoscopy in 08-2022. We will send you a reminder in the mail when it gets closer to that time.   You will be due for a RUQ ultrasound of the liver in May of this year.  Use Imodium 1/2 tablet every morning. Titrate up as needed for diarrhea.  Use Lidocaine patches or capsaicin cream over-the-counter as needed.  We are giving you a Low-FODMAP diet handout today. FODMAPs are short-chain carbohydrates (sugars) that are highly fermentable, which means that they go through chemical changes in the GI system, and are poorly absorbed during digestion. When FODMAPs reach the colon (large intestine), bacteria ferment these sugars, turning them into gas and chemicals. This stretches the walls of the colon,  causing abdominal bloating, distension, cramping, pain, and/or changes in bowel habits in many patients with IBS. FODMAPs are not unhealthy or harmful, but may exacerbate GI symptoms in those with sensitive GI tracts.   Thank you for entrusting me with your care and for choosing [facility], Dr. [place] [facility]

## 2021-09-13 NOTE — Telephone Encounter (Signed)
Patient saw Dr. Havery Moros today and was told she needed a liver scan in May and a f/u appt with him in July.  She was told we do not have the July appt as of yet to schedule with Dr. Loni Muse; however, she was unsure if she was supposed to make the appt for the liver scan while she was here or if the nurse was going to make the appt and call her.  Can you please call and let her know about the liver scan.  Thank you.

## 2021-09-13 NOTE — Progress Notes (Signed)
HPI :  61 year old female here for a follow up visit for NASH cirrhosis, abdominal pain, chronic loose stools.    Recall that she previously followed at [employer], [place] hepatology clinic (Dr. Lurlean Leyden), and [facility] with cirrhosis several years ago. She had a positive ANA and mildly positive smooth muscle antibody and it sounds like she was started empirically on steroids at one point in time as well as budesonide.  Her liver enzymes were fluctuating.  She was seen by [place] / Dr. Precious Gilding office in 2018 who reviewed her history and recommended a liver biopsy.  At that point time biopsy showed grade 2 of 3 steatohepatitis along with cirrhosis.  Biopsy was most suggestive of Karlene Lineman cirrhosis and not autoimmune hepatitis.  Her steroids were stopped at that time.  Her mother died of cirrhosis thought to be due to fatty liver disease.  The patient emphatically denies any significant alcohol use historically.  She really does not drink any alcohol at all at this time.    Recall she has also had a history of iron deficiency anemia and GI bleeding in the past.  In 2018 with [facility] she had EGD and colonoscopy which did not yield cause for bleeding, reportedly had a questionable vascular lesion on a capsule endoscopy at that time.  She does not tolerate oral iron very well, and had a reaction to IV iron in the past.   She is here for a follow-up visit today.  Her cirrhosis has fortunately remained compensated.  She does have some baseline thrombocytopenia but she has not had any jaundice, no history of encephalopathy, no ascites.  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  She continues to abstain from alcohol.  She also has a history of significant colon polyps, 16 adenomatous polyps removed in 2021.  She had negative genetic testing.  We repeated her colonoscopy last week which showed  no polyps which is excellent news.  She had no overt inflammatory changes but biopsies were obtained given her chronic loose stools and they were negative for microscopic colitis.  She endorses having some chronic loose stools for some time now.  She will have anywhere from 2-3 bowel movements per day upwards to multiple stools an hour depending on if she is having a "flare" of her symptoms.  She states "flares" can last anywhere from days to weeks at a time where she has significant diarrhea.  That being said the last few months she is felt pretty well without any significant problems.  I gave her a trial of Colestid in light of her postcholecystectomy state and some of her postprandial urgency however she states it upset her stomach and she did not like it, so stopped it.  She does have some certain food triggers that can bother her.  She tested negative for celiac disease in the past.  She does find that taking Imodium can help her.  Higher dose has led to constipation in the past.  She otherwise inquires about her ongoing right upper quadrant pain.  Tends to be near her cholecystectomy scar.  This has been also going on for some time now.  Is tender to touch with palpation.  Can be made worse with positional changes, lifting, or lying in a particular way.  She notices this a lot when she is sleeping and moves around  to make herself feel better.  I thought she had abdominal wall pain/nerve entrapment causing this previously.  We did a trigger point injection for her in May.  She states that has reduced the intensity of the pain and generally has made it better but definitely did not resolve it.  She has not tried any topical measures otherwise to treat this.  Characteristics of the pain are about the same since have last seen her other than less intense.    Not immune to hep A / B 05/2019 -she states she declined vaccination Alpha one, AMA nehgative, iron studies okay, celiac serology negative      Liver  biopsy 03/15/18 -  Liver, needle/core biopsy, transjug - MILD TO MODERATELY ACTIVE STEATOHEPATITIS (GRADE 1-2 OF 3; IF DETERMINED TO BE NAFLD, THEN THE NAS = 3 OF 8). - CIRRHOSIS (STAGE 4 OF 4) The biopsy is adequate for review and consists of several cores of liver with a distorted architecture. Trichrome stain highlights the presence of septal fibrosis as well as a pericellular pattern of fibrosis. Hepatic nodules show mild macrovesicular steatosis (approximately 20%) with mild to moderate ballooning degeneration and few poorly formed Mallory hyalins. Hepatic nodules do not show any significant inflammation. Definitive megamitochondria are not seen. The portal tracts and fibrous septa mild to moderate, mostly lymphocytic inflammation without significant interface hepatitis. Mild ductular reaction is present with associated nonspecific neutrophilic response. Iron stain shows moderate granular iron accumulation within Kupffer cells, consistent with secondary iron overload. PASD stain shows no globular inclusions in hepatocytes. The findings are consistent with mold to moderately acive steatohepatitis, alcoholic and/or non-alcoholic, with cirrhosis.     EGD 07/29/20 -  - A 1 cm hiatal hernia was present. - Small (< 5 mm) varices were found in the lower third of the esophagus without any high risk stigmata for bleeding - The exam of the esophagus was otherwise normal. - Moderate suspected portal hypertensive gastropathy was found in the entire examined stomach - snakeskin appearance with erythema and adherent heme. Biopsies were taken with a cold forceps for Helicobacter pylori testing. - A single 4-5 mm sessile polyp was found in the gastric fundus with a yellowish hue. Biopsies were taken with a cold forceps for histology. - The exam of the stomach was otherwise normal. No gastric varices - The duodenal bulb and second portion of the duodenum were normal.      Colonoscopy 07/29/20 - One 5 mm  polyp in the ascending colon, removed with a cold snare. Resected and retrieved. - Two 5 mm polyps at the hepatic flexure, removed with a cold snare. Resected and retrieved. - Five 4 to 8 mm polyps in the transverse colon, removed with a cold snare. Resected and retrieved. - Two 4 to 5 mm polyps in the descending colon, removed with a cold snare. Resected and retrieved. - Four 3 to 4 mm polyps in the sigmoid colon, removed with a cold snare. Resected and retrieved. - Two 3 to 4 mm polyps in the rectum, removed with a cold snare. Resected and retrieved. - A few recently bleeding colonic angiodysplastic lesions. - Internal hemorrhoids. - The examination was otherwise normal.   1. Surgical [P], stomach, gastric - MILD CHRONIC GASTRITIS. MILD REACTIVE GASTROPATHY. - WARTHIN-STARRY STAIN IS NEGATIVE FOR HELICOBACTER PYLORI. 2. Surgical [P], stomach, gastric polyp (1) - HYPERPLASTIC POLYP. - NEGATIVE FOR GOBLET CELL METAPLASIA. 3. Surgical [P], colon, sigmoid, hepatic flexure, ascending, transverse, descending, rectal, polyps (16) - TUBULAR ADENOMA (X MULTIPLE). -  NEGATIVE FOR HIGH GRADE DYSPLASIA.     Abdominal wall trigger point injection 01/11/21   Tested negative for C diff 07/11/21   EGD 09/06/21: - A 1 cm hiatal hernia was present. - Small varices were found in the middle third of the esophagus and in the lower third of the esophagus, unchanged from the prior exam. No high risk stigmata for bleeding. - The exam of the esophagus was otherwise normal. - Portal hypertensive gastropathy was found in the entire examined stomach (prior biopsies on last exam ruled out H pylori - no further biopsies taken today) - The exam of the stomach was otherwise normal. No gastric varices. - A single diminutive angiodysplastic lesion without bleeding was found in the second portion of the duodenum. - The exam of the duodenum was otherwise normal.   Colonoscopy 09/06/21: - The perianal and  digital rectal examinations were normal. - A few small angiodysplastic lesions were found in the rectum, in the sigmoid colon, in the transverse colon and in the ascending colon. - Internal hemorrhoids were found during retroflexion. - The exam was otherwise without abnormality. - Biopsies for histology were taken with a cold forceps from the right colon, left colon and transverse colon for evaluation of microscopic colitis given patient's incidental complaint of persistent loose stools.   Surgical [P], random colon - diarrhea BENIGN COLONIC MUCOSA WITH NO DIAGNOSTIC ABNORMALITY    Past Medical History:  Diagnosis Date   Anemia    Anticoagulation monitoring by pharmacist    Arthritis    Asthma    Blood transfusion without reported diagnosis    Cataract    removed both eyes   Cirrhosis ([place])    COPD (chronic obstructive pulmonary disease) ([place])    Diabetes mellitus without complication ([place])    Dyspnea    exertion   Elevated hemoglobin A1c 03/01/2018   Family history of Hodgkin's lymphoma    Family history of leukemia    Family history of lung cancer    Family history of stomach cancer    Family history of uterine cancer    GERD (gastroesophageal reflux disease)    mild- diet related   Heart murmur    History of colon polyps    History of pulmonary embolism 02/04/2013   Hyperlipidemia    controlled   NASH (nonalcoholic steatohepatitis)    Neuromuscular disorder ([place])    back surgery   PE (pulmonary embolism)    Sleep apnea    no cpap     Past Surgical History:  Procedure Laterality Date   ABDOMINAL HYSTERECTOMY  2009   BREAST BIOPSY Left    CATARACT EXTRACTION, BILATERAL Bilateral    CHOLECYSTECTOMY     COLONOSCOPY WITH PROPOFOL N/A 05/07/2017   Procedure: COLONOSCOPY WITH PROPOFOL;  Surgeon: Laurence Spates, MD;  Location: [facility];  Service: Endoscopy;  Laterality: N/A;   ESOPHAGOGASTRODUODENOSCOPY (EGD) WITH PROPOFOL N/A 05/07/2017   Procedure:  ESOPHAGOGASTRODUODENOSCOPY (EGD) WITH PROPOFOL;  Surgeon: Laurence Spates, MD;  Location: [facility];  Service: Endoscopy;  Laterality: N/A;   INCONTINENCE SURGERY  2009   with hysterectomy   IR TRANSCATHETER BX  03/15/2018   IR VENOGRAM HEPATIC W HEMODYNAMIC EVALUATION  03/15/2018   LEFT AND RIGHT HEART CATHETERIZATION WITH CORONARY ANGIOGRAM N/A 05/13/2014   Procedure: LEFT AND RIGHT HEART CATHETERIZATION WITH CORONARY ANGIOGRAM;  Surgeon: [name], MD;  Location: [facility] CATH LAB;  Service: Cardiovascular;  Laterality: N/A;   SPINE SURGERY     L5 S1 fusion x4  TUBAL LIGATION     WRIST SURGERY Right    Had surgery twice to shave bone for blood circulation improvement.   Family History  Problem Relation Age of Onset   Cirrhosis Mother        NASH   Irritable bowel syndrome Mother    Hyperlipidemia Father    Heart failure Father    Bipolar disorder Father    Heart disease Father    Obesity Daughter    Endometrial cancer Daughter 32   Hypertension Brother    Colon polyps Sister        unknown number but fewer than patient   Irritable bowel syndrome Sister    Heart failure Paternal Grandfather    Stomach cancer Maternal Uncle        dx 42s/50s   Cancer Paternal Aunt        NOS   Cancer Paternal Uncle        NOS   Lung cancer Maternal Uncle        dx 40s/50s, smoker   Cancer Maternal Uncle        NOS   Cancer Paternal Aunt        NOS   Leukemia Cousin        dx 61s, died 2 weeks after diagnosis (maternal first cousin)   Cancer Cousin        NOS (paternal first cousin)   Hodgkin's lymphoma Other 59       4th degree relative (mother's half-sibling's son)   Heart attack Neg Hx    Miscarriages / Stillbirths Neg Hx    Colon cancer Neg Hx    Esophageal cancer Neg Hx    Rectal cancer Neg Hx    Social History   Tobacco Use   Smoking status: Former    Packs/day: 2.00    Years: 50.00    Pack years: 100.00    Types: Cigarettes    Start date: 08/22/1977    Quit  date: 04/21/2012    Years since quitting: 9.4   Smokeless tobacco: Never  Vaping Use   Vaping Use: Never used  Substance Use Topics   Alcohol use: No   Drug use: No   Current Outpatient Medications  Medication Sig Dispense Refill   acetaminophen (TYLENOL) 500 MG tablet Take 500 mg by mouth every 6 (six) hours as needed.     albuterol (VENTOLIN HFA) 108 (90 Base) MCG/ACT inhaler Inhale 2 puffs into the lungs every 6 (six) hours as needed for wheezing or shortness of breath. 18 g 0   Blood Glucose Monitoring Suppl (ONETOUCH VERIO REFLECT) w/Device KIT Test BS twice daily Dx e11.9 1 kit 0   fluticasone (FLONASE) 50 MCG/ACT nasal spray Place 2 sprays into both nostrils daily. 16 g 11   furosemide (LASIX) 20 MG tablet Take 1 tablet (20 mg total) by mouth as needed. 30 tablet 3   glucose blood (ONETOUCH VERIO) test strip Test BS twice daily Dx e11.9 200 strip 3   ipratropium-albuterol (DUONEB) 0.5-2.5 (3) MG/3ML SOLN Take 3 mLs by nebulization every 6 (six) hours as needed. 360 mL 1   Lancets (ONETOUCH DELICA PLUS TSVXBL39Q) MISC Test BS twice daily Dx e11.9 200 each 3   metFORMIN (GLUCOPHAGE-XR) 750 MG 24 hr tablet Take 1 tablet (750 mg total) by mouth daily with breakfast. 90 tablet 3   No current facility-administered medications for this visit.   Allergies  Allergen Reactions   Aciphex [Rabeprazole Sodium] Rash   Fluconazole Rash  and Hives   Lansoprazole Hives   Septra [Sulfamethoxazole-Trimethoprim] Nausea And Vomiting    "made me bleed"   Sucralfate Nausea Only   Vancomycin Diarrhea   Claritin [Loratadine] Other (See Comments)    Tired    Crestor [Rosuvastatin] Other (See Comments)    Elevated blood glucose and abdominal pain   Doxycycline Rash   Ibuprofen Other (See Comments)    Upset stomach   Lipitor [Atorvastatin] Other (See Comments)    Myalgias, leg pain.  Problems with pancreas, sugars went up also.   Metoprolol Nausea And Vomiting   Metronidazole Other (See Comments)  and Nausea Only    Dizziness, nausea, dry mouth and some shortness of breath   Nsaids Other (See Comments)    Upset stomach   Omeprazole     bloating   Rabeprazole Rash     Review of Systems: All systems reviewed and negative except where noted in HPI.   Lab Results  Component Value Date   WBC 5.7 06/23/2021   HGB 13.8 06/23/2021   HCT 41.1 06/23/2021   MCV 86.7 06/23/2021   PLT 71 (L) 06/23/2021    Lab Results  Component Value Date   CREATININE 0.51 (L) 07/28/2021   BUN 7 (L) 07/28/2021   NA 144 07/28/2021   K 3.5 07/28/2021   CL 104 07/28/2021   CO2 24 07/28/2021    Lab Results  Component Value Date   ALT 31 06/23/2021   AST 42 (H) 06/23/2021   ALKPHOS 72 06/23/2021   BILITOT 2.6 (H) 06/23/2021     Physical Exam: BP 122/62    Pulse 70    Ht '5\' 4"'  (1.626 m)    Wt 204 lb 2 oz (92.6 kg)    SpO2 98%    BMI 35.04 kg/m  Constitutional: Pleasant,well-developed, female in no acute distress. Abdominal: Soft, nondistended, focal RUQ TTP with positive Carnett sign,  There are no masses palpable.  Extremities: no edema Neurological: Alert and oriented to person place and time. Psychiatric: Normal mood and affect. Behavior is normal.   ASSESSMENT AND PLAN: 61 year old female here for reassessment of the following:  Cirrhosis - secondary to NASH Esophageal varices Chronic diarrhea Abdominal wall pain History of colon polyps  Cirrhosis has remained compensated.  She is due for AFP and INR today.  Due for surveillance ultrasound in May, (she had a renal CT scan done which showed no pathology in her liver in November).  Labs otherwise up-to-date.  She is due for an EGD in January 2024.  We spent the most of this visit discussing her loose stools and abdominal pain.  She has had a pretty extensive work-up for her bowel habits, suspect functional bowel disorder at this point time.  Did not respond to Colestid in the past in light of her postcholecystectomy state.  She does  respond quite well to Imodium, in fact can cause constipation.  Recommend half tab of Imodium every morning when she is having loose stools and can titrate up that as needed.  Provided some handouts for low FODMAP diet to see if that will help as well as she does have some food triggers.  She has tested negative for celiac disease.  She otherwise has abdominal wall pain, nerve impingement at her cholecystectomy scar.  Treated with trigger point injection in the past which has provided some benefit but not resolution.  Discussed options, she will try some lidocaine patches, can also try some capsaicin cream or Voltaren gel as needed  to see if that helps.  We can also consider another trigger point injection if she wants to pursue that at some point time, she will let me know.  Plan: - lab today - INR and AFP - RUQ [place] for [place] screening in May  - EGD due in Jan 2024 - Immodium 1/2 tab q AM and titrate up as needed for diarrhea - l trial of low FODMAP diet, handout given - lidocaine patches OTC, capsaicin cream OTC, - consider repeat trigger point injection to RUQ pending her course  Jolly Mango, MD [facility]

## 2021-09-14 LAB — SURGICAL PATHOLOGY

## 2021-09-14 LAB — AFP TUMOR MARKER: AFP-Tumor Marker: 3.8 ng/mL

## 2021-09-14 NOTE — Telephone Encounter (Signed)
Called and spoke to patient.reviewed the paln from yesterday. RUQ [place] for [facility] screening in May - EGD due in Jan 2024.  Placed a recall OV for July (6 months)

## 2021-09-15 ENCOUNTER — Telehealth: Payer: Self-pay

## 2021-09-15 NOTE — Telephone Encounter (Signed)
Patient called office concerning upcoming CT scan. Reports that she had ovarian cyst removed and noticed that her pain symptoms disappeared.  Patient has f/u with GYN and will discuss if she wants to move forward with CT hematuria study ordered by our office.

## 2021-09-19 ENCOUNTER — Ambulatory Visit: Payer: Medicare Other | Admitting: Adult Health

## 2021-09-19 ENCOUNTER — Encounter: Payer: Self-pay | Admitting: Adult Health

## 2021-09-19 ENCOUNTER — Other Ambulatory Visit: Payer: Self-pay

## 2021-09-19 VITALS — BP 125/72 | HR 86 | Ht 64.0 in | Wt 203.5 lb

## 2021-09-19 DIAGNOSIS — N72 Inflammatory disease of cervix uteri: Secondary | ICD-10-CM | POA: Diagnosis not present

## 2021-09-19 DIAGNOSIS — N888 Other specified noninflammatory disorders of cervix uteri: Secondary | ICD-10-CM | POA: Diagnosis not present

## 2021-09-19 NOTE — Progress Notes (Signed)
°  Subjective:     Patient ID: Kimberly Obrien, female   DOB: December 12, 1960, 61 y.o.   MRN: [id number]  HPI Kimberly Obrien is a 61 year old white female, married, sp [facility], back in follow up after having cervical biopsy 09/12/21. No bleeding now and pain, can pee much easier. PCP is Dr Livia Snellen. Lab Results  Component Value Date   DIAGPAP  05/31/2021    - Negative for Intraepithelial Lesions or Malignancy (NILM)   DIAGPAP - Benign reactive/reparative changes 05/31/2021    Review of Systems No bleeding or pain Peeing is much better Reviewed past medical,surgical, social and family history. Reviewed medications and allergies.     Objective:   Physical Exam BP 125/72 (BP Location: Left Arm, Patient Position: Sitting, Cuff Size: Normal)    Pulse 86    Ht 5\' 4"  (1.626 m)    Wt 203 lb 8 oz (92.3 kg)    BMI 34.93 kg/m     Skin warm and dry.Pelvic: external genitalia is normal in appearance no lesions, vagina: pale with loss of rugae,urethra has no lesions or masses noted, cervix is less irritated, no nabothian cyst seen. The pathology was abundant mucus with small fragment of inflamed granulation tissue, no malignancy identified.  Examination chaperoned by Kimberly Pupa LPN   Assessment:     1. Cervicitis with nabothian cyst, resolved Call me if nay bleeding or pain returns Still has Metrogel, can use     Plan:    Follow up in 3 months or sooner if needed Follow up with urologist as needed

## 2021-09-20 ENCOUNTER — Ambulatory Visit (HOSPITAL_COMMUNITY)
Admission: RE | Admit: 2021-09-20 | Discharge: 2021-09-20 | Disposition: A | Discharge status: Home / Self Care | Payer: Medicare Other | Source: Ambulatory Visit | Attending: Physician Assistant | Admitting: Physician Assistant

## 2021-09-20 DIAGNOSIS — R319 Hematuria, unspecified: Secondary | ICD-10-CM | POA: Diagnosis not present

## 2021-09-20 DIAGNOSIS — R310 Gross hematuria: Secondary | ICD-10-CM | POA: Diagnosis not present

## 2021-09-20 DIAGNOSIS — R109 Unspecified abdominal pain: Secondary | ICD-10-CM | POA: Diagnosis not present

## 2021-09-20 LAB — POCT I-STAT CREATININE: Creatinine, Ser: 0.5 mg/dL (ref 0.44–1.00)

## 2021-09-20 MED ORDER — IOHEXOL 300 MG/ML  SOLN
100.0000 mL | Freq: Once | INTRAMUSCULAR | Status: AC | PRN
  Administered 2021-09-20: 100 mL via INTRAVENOUS

## 2021-09-26 ENCOUNTER — Other Ambulatory Visit: Payer: Medicare Other | Admitting: Urology

## 2021-09-26 NOTE — Progress Notes (Deleted)
Assessment: 1. Gross hematuria      Plan:    Chief Complaint:  Gross hematuria    History of Present Illness:  Kimberly Obrien is a 61 y.o. year old female who is seen for further evaluation of a left ureteral calculus.  She had onset of frequency, pelvic pressure, and gross hematuria approximately 2 weeks prior to her evaluation on 06/30/2021.Marland Kitchen  Urinalysis showed >30 RBCs with moderate bacteria.  Urine culture grew 10-20 5K mixed flora.  She was treated with antibiotics for presumed UTI.  She subsequently developed left-sided flank pain with radiation to the left abdomen.  She was seen in the emergency room on 06/23/2021.  U/A positive for blood.  Urine culture showed no growth.  CT urogram showed a 3 mm calculus in the left proximal ureter near the UPJ with mild hydronephrosis.  No other stones were seen.  She was treated with pain medication and Flomax.  She reports passing a stone approximately 2 days later.  Her flank pain and abdominal pain improved.  She has had ongoing issues with constipation.  She is having more normal bowel movements now.  No prior history of kidney stones.  No current dysuria or gross hematuria. Urine culture from 06/30/2021 grew mixed flora.  She was seen on 07/28/21 for follow up of ureteral stone passage. She continued to c/o frequency, dysuria, and gross hematuria as well as low back discomfort. Back pain relieved with positioning and occasional Tylenol. No fever, chills, nausea, vomiting. Mild abdominal pain LLQ and suprapubic. Some diarrhea that she associates with recent Rx of Amoxil  by her primary care provider for treatment of abnormal urinalysis on 11/22.  Culture obtained on that visit also showed mixed flora.    Resolve MDX culture from 07/28/2021 showed no growth. CT hematuria profile from 09/20/2021 demonstrated no renal or ureteral calculi, no hydronephrosis, left renal cyst.   Portions of the above documentation were copied from a prior visit  for review purposes only.   Past Medical History:  Past Medical History:  Diagnosis Date   Anemia    Anticoagulation monitoring by pharmacist    Arthritis    Asthma    Blood transfusion without reported diagnosis    Cataract    removed both eyes   Cirrhosis ([place])    COPD (chronic obstructive pulmonary disease) ([place])    Diabetes mellitus without complication (HCC)    Dyspnea    exertion   Elevated hemoglobin A1c 03/01/2018   Family history of Hodgkin's lymphoma    Family history of leukemia    Family history of lung cancer    Family history of stomach cancer    Family history of uterine cancer    GERD (gastroesophageal reflux disease)    mild- diet related   Heart murmur    History of colon polyps    History of pulmonary embolism 02/04/2013   Hyperlipidemia    controlled   NASH (nonalcoholic steatohepatitis)    Neuromuscular disorder ([place])    back surgery   PE (pulmonary embolism)    Sleep apnea    no cpap    Past Surgical History:  Past Surgical History:  Procedure Laterality Date   ABDOMINAL HYSTERECTOMY  2009   BREAST BIOPSY Left    CATARACT EXTRACTION, BILATERAL Bilateral    CHOLECYSTECTOMY     COLONOSCOPY WITH PROPOFOL N/A 05/07/2017   Procedure: COLONOSCOPY WITH PROPOFOL;  Surgeon: Laurence Spates, MD;  Location: [facility];  Service: Endoscopy;  Laterality: N/A;  ESOPHAGOGASTRODUODENOSCOPY (EGD) WITH PROPOFOL N/A 05/07/2017   Procedure: ESOPHAGOGASTRODUODENOSCOPY (EGD) WITH PROPOFOL;  Surgeon: Laurence Spates, MD;  Location: [facility];  Service: Endoscopy;  Laterality: N/A;   INCONTINENCE SURGERY  2009   with hysterectomy   IR TRANSCATHETER BX  03/15/2018   IR VENOGRAM HEPATIC W HEMODYNAMIC EVALUATION  03/15/2018   LEFT AND RIGHT HEART CATHETERIZATION WITH CORONARY ANGIOGRAM N/A 05/13/2014   Procedure: LEFT AND RIGHT HEART CATHETERIZATION WITH CORONARY ANGIOGRAM;  Surgeon: [name], MD;  Location: [facility] CATH LAB;  Service: Cardiovascular;   Laterality: N/A;   SPINE SURGERY     L5 S1 fusion x4   TUBAL LIGATION     WRIST SURGERY Right    Had surgery twice to shave bone for blood circulation improvement.    Allergies:  Allergies  Allergen Reactions   Aciphex [Rabeprazole Sodium] Rash   Fluconazole Rash and Hives   Lansoprazole Hives   Septra [Sulfamethoxazole-Trimethoprim] Nausea And Vomiting    "made me bleed"   Sucralfate Nausea Only   Macrobid [Nitrofurantoin] Diarrhea   Vancomycin Diarrhea   Claritin [Loratadine] Other (See Comments)    Tired    Crestor [Rosuvastatin] Other (See Comments)    Elevated blood glucose and abdominal pain   Doxycycline Rash   Ibuprofen Other (See Comments)    Upset stomach   Lipitor [Atorvastatin] Other (See Comments)    Myalgias, leg pain.  Problems with pancreas, sugars went up also.   Metoprolol Nausea And Vomiting   Metronidazole Other (See Comments) and Nausea Only    Dizziness, nausea, dry mouth and some shortness of breath   Nsaids Other (See Comments)    Upset stomach   Omeprazole     bloating   Rabeprazole Rash    Family History:  Family History  Problem Relation Age of Onset   Cirrhosis Mother        NASH   Irritable bowel syndrome Mother    Hyperlipidemia Father    Heart failure Father    Bipolar disorder Father    Heart disease Father    Obesity Daughter    Endometrial cancer Daughter 36   Hypertension Brother    Colon polyps Sister        unknown number but fewer than patient   Irritable bowel syndrome Sister    Heart failure Paternal Grandfather    Stomach cancer Maternal Uncle        dx 57s/50s   Cancer Paternal Aunt        NOS   Cancer Paternal Uncle        NOS   Lung cancer Maternal Uncle        dx 40s/50s, smoker   Cancer Maternal Uncle        NOS   Cancer Paternal Aunt        NOS   Leukemia Cousin        dx 68s, died 2 weeks after diagnosis (maternal first cousin)   Cancer Cousin        NOS (paternal first cousin)   Hodgkin's lymphoma  Other 65       4th degree relative (mother's half-sibling's son)   Heart attack Neg Hx    Miscarriages / Stillbirths Neg Hx    Colon cancer Neg Hx    Esophageal cancer Neg Hx    Rectal cancer Neg Hx     Social History:  Social History   Tobacco Use   Smoking status: Former    Packs/day: 2.00  Years: 50.00    Pack years: 100.00    Types: Cigarettes    Start date: 08/22/1977    Quit date: 04/21/2012    Years since quitting: 9.4   Smokeless tobacco: Never  Vaping Use   Vaping Use: Never used  Substance Use Topics   Alcohol use: No   Drug use: No    ROS: Constitutional:  Negative for fever, chills, weight loss CV: Negative for chest pain, previous MI, hypertension Respiratory:  Negative for shortness of breath, wheezing, sleep apnea, frequent cough GI:  Negative for nausea, vomiting, bloody stool, GERD  Physical exam: There were no vitals taken for this visit. ***  Results: No results found for this or any previous visit (from the past 24 hour(s)).    Procedure:  Flexible Cystourethroscopy  Pre-operative Diagnosis: {cysto diagnosis:26394}  Post-operative Diagnosis: {cysto diagnosis:26394}  Anesthesia:  local with lidocaine jelly  Surgical Narrative:  After appropriate informed consent was obtained, the patient was prepped and draped in the usual sterile fashion in the supine position.  The patient was correctly identified and the proper procedure delineated prior to proceeding.  Sterile lidocaine gel was instilled in the urethra. The flexible cystoscope was introduced without difficulty.  Findings:  Urethra: {anterior urethral findings:26395}  Bladder: {bladder findings:26397}  Ureteral orifices: {Normal/Abnormal Appearance:21344::"normal"}  Additional findings:  Saline bladder wash for cytology {WAS/WAS NOT:[phone]::"was not"} performed.    The cystoscope was then removed.  The patient tolerated the procedure well.

## 2021-09-29 ENCOUNTER — Ambulatory Visit (INDEPENDENT_AMBULATORY_CARE_PROVIDER_SITE_OTHER): Payer: Medicare Other

## 2021-09-29 VITALS — Wt 201.0 lb

## 2021-09-29 DIAGNOSIS — Z0000 Encounter for general adult medical examination without abnormal findings: Secondary | ICD-10-CM

## 2021-09-29 NOTE — Patient Instructions (Signed)
Kimberly Obrien , Thank you for taking time to come for your Medicare Wellness Visit. I appreciate your ongoing commitment to your health goals. Please review the following plan we discussed and let me know if I can assist you in the future.   Screening recommendations/referrals: Colonoscopy: Done 09/06/2021 - repeat in 3 years Mammogram: Done12/28/2022 - Repeat annually  Bone Density: Done 11/18/2020 - Repeat every 2 years  Recommended yearly ophthalmology/optometry visit for glaucoma screening and checkup Recommended yearly dental visit for hygiene and checkup  Vaccinations: Influenza vaccine: Patient declined  Pneumococcal vaccine: Patient declined  Tdap vaccine: Done 04/20/2017 - Repeat in 10 years Shingles vaccine: Patient declined   Covid-19: Patient declined   Advanced directives: Advance directive discussed with you today. Even though you declined this today, please call our office should you change your mind, and we can give you the proper paperwork for you to fill out.   Conditions/risks identified: Read the tips at the end of this summary for exercising with COPD.  Next appointment: Follow up in one year for your annual wellness visit.   Preventive Care 40-64 Years, Female Preventive care refers to lifestyle choices and visits with your health care provider that can promote health and wellness. What does preventive care include? A yearly physical exam. This is also called an annual well check. Dental exams once or twice a year. Routine eye exams. Ask your health care provider how often you should have your eyes checked. Personal lifestyle choices, including: Daily care of your teeth and gums. Regular physical activity. Eating a healthy diet. Avoiding tobacco and drug use. Limiting alcohol use. Practicing safe sex. Taking low-dose aspirin daily starting at age 91. Taking vitamin and mineral supplements as recommended by your health care provider. What happens during an  annual well check? The services and screenings done by your health care provider during your annual well check will depend on your age, overall health, lifestyle risk factors, and family history of disease. Counseling  Your health care provider may ask you questions about your: Alcohol use. Tobacco use. Drug use. Emotional well-being. Home and relationship well-being. Sexual activity. Eating habits. Work and work Statistician. Method of birth control. Menstrual cycle. Pregnancy history. Screening  You may have the following tests or measurements: Height, weight, and BMI. Blood pressure. Lipid and cholesterol levels. These may be checked every 5 years, or more frequently if you are over 10 years old. Skin check. Lung cancer screening. You may have this screening every year starting at age 70 if you have a 30-pack-year history of smoking and currently smoke or have quit within the past 15 years. Fecal occult blood test (FOBT) of the stool. You may have this test every year starting at age 1. Flexible sigmoidoscopy or colonoscopy. You may have a sigmoidoscopy every 5 years or a colonoscopy every 10 years starting at age 31. Hepatitis C blood test. Hepatitis B blood test. Sexually transmitted disease (STD) testing. Diabetes screening. This is done by checking your blood sugar (glucose) after you have not eaten for a while (fasting). You may have this done every 1-3 years. Mammogram. This may be done every 1-2 years. Talk to your health care provider about when you should start having regular mammograms. This may depend on whether you have a family history of breast cancer. BRCA-related cancer screening. This may be done if you have a family history of breast, ovarian, tubal, or peritoneal cancers. Pelvic exam and Pap test. This may be done every 3 years  starting at age 44. Starting at age 55, this may be done every 5 years if you have a Pap test in combination with an HPV test. Bone  density scan. This is done to screen for osteoporosis. You may have this scan if you are at high risk for osteoporosis. Discuss your test results, treatment options, and if necessary, the need for more tests with your health care provider. Vaccines  Your health care provider may recommend certain vaccines, such as: Influenza vaccine. This is recommended every year. Tetanus, diphtheria, and acellular pertussis (Tdap, Td) vaccine. You may need a Td booster every 10 years. Zoster vaccine. You may need this after age 58. Pneumococcal 13-valent conjugate (PCV13) vaccine. You may need this if you have certain conditions and were not previously vaccinated. Pneumococcal polysaccharide (PPSV23) vaccine. You may need one or two doses if you smoke cigarettes or if you have certain conditions. Talk to your health care provider about which screenings and vaccines you need and how often you need them. This information is not intended to replace advice given to you by your health care provider. Make sure you discuss any questions you have with your health care provider. Document Released: 09/03/2015 Document Revised: 04/26/2016 Document Reviewed: 06/08/2015 Elsevier Interactive Patient Education  2017 [place] Prevention in the Home Falls can cause injuries. They can happen to people of all ages. There are many things you can do to make your home safe and to help prevent falls. What can I do on the outside of my home? Regularly fix the edges of walkways and driveways and fix any cracks. Remove anything that might make you trip as you walk through a door, such as a raised step or threshold. Trim any bushes or trees on the path to your home. Use bright outdoor lighting. Clear any walking paths of anything that might make someone trip, such as rocks or tools. Regularly check to see if handrails are loose or broken. Make sure that both sides of any steps have handrails. Any raised decks and  porches should have guardrails on the edges. Have any leaves, snow, or ice cleared regularly. Use sand or salt on walking paths during winter. Clean up any spills in your garage right away. This includes oil or grease spills. What can I do in the bathroom? Use night lights. Install grab bars by the toilet and in the tub and shower. Do not use towel bars as grab bars. Use non-skid mats or decals in the tub or shower. If you need to sit down in the shower, use a plastic, non-slip stool. Keep the floor dry. Clean up any water that spills on the floor as soon as it happens. Remove soap buildup in the tub or shower regularly. Attach bath mats securely with double-sided non-slip rug tape. Do not have throw rugs and other things on the floor that can make you trip. What can I do in the bedroom? Use night lights. Make sure that you have a light by your bed that is easy to reach. Do not use any sheets or blankets that are too big for your bed. They should not hang down onto the floor. Have a firm chair that has side arms. You can use this for support while you get dressed. Do not have throw rugs and other things on the floor that can make you trip. What can I do in the kitchen? Clean up any spills right away. Avoid walking on wet floors. Keep  items that you use a lot in easy-to-reach places. If you need to reach something above you, use a strong step stool that has a grab bar. Keep electrical cords out of the way. Do not use floor polish or wax that makes floors slippery. If you must use wax, use non-skid floor wax. Do not have throw rugs and other things on the floor that can make you trip. What can I do with my stairs? Do not leave any items on the stairs. Make sure that there are handrails on both sides of the stairs and use them. Fix handrails that are broken or loose. Make sure that handrails are as long as the stairways. Check any carpeting to make sure that it is firmly attached to the  stairs. Fix any carpet that is loose or worn. Avoid having throw rugs at the top or bottom of the stairs. If you do have throw rugs, attach them to the floor with carpet tape. Make sure that you have a light switch at the top of the stairs and the bottom of the stairs. If you do not have them, ask someone to add them for you. What else can I do to help prevent falls? Wear shoes that: Do not have high heels. Have rubber bottoms. Are comfortable and fit you well. Are closed at the toe. Do not wear sandals. If you use a stepladder: Make sure that it is fully opened. Do not climb a closed stepladder. Make sure that both sides of the stepladder are locked into place. Ask someone to hold it for you, if possible. Clearly mark and make sure that you can see: Any grab bars or handrails. First and last steps. Where the edge of each step is. Use tools that help you move around (mobility aids) if they are needed. These include: Canes. Walkers. Scooters. Crutches. Turn on the lights when you go into a dark area. Replace any light bulbs as soon as they burn out. Set up your furniture so you have a clear path. Avoid moving your furniture around. If any of your floors are uneven, fix them. If there are any pets around you, be aware of where they are. Review your medicines with your doctor. Some medicines can make you feel dizzy. This can increase your chance of falling. Ask your doctor what other things that you can do to help prevent falls. This information is not intended to replace advice given to you by your health care provider. Make sure you discuss any questions you have with your health care provider. Document Released: 06/03/2009 Document Revised: 01/13/2016 Document Reviewed: 09/11/2014 Elsevier Interactive Patient Education  2017 [employer]   COPD and Physical Activity Chronic obstructive pulmonary disease (COPD) is a long-term, or chronic, condition that affects the lungs. COPD is a  general term that can be used to describe many problems that cause inflammation of the lungs and limit airflow. These conditions include chronic bronchitis and emphysema. The main symptom of COPD is shortness of breath, which makes it harder to do even simple tasks. This can also make it harder to exercise and stay active. Talk with your health care provider about treatments to help you breathe better and actions you can take to prevent breathing problems during physical activity. What are the benefits of exercising when you have COPD? Exercising regularly is an important part of a healthy lifestyle. You can still exercise and do physical activities even though you have COPD. Exercise and physical activity improve your shortness  of breath by increasing blood flow (circulation). This causes your heart to pump more oxygen through your body. Moderate exercise can: Improve oxygen use. Increase your energy level. Help with shortness of breath. Strengthen your breathing muscles. Improve heart health. Help with sleep. Improve your self-esteem and feelings of self-worth. Lower depression, stress, and anxiety. Exercise can benefit everyone with COPD. The severity of your disease may affect how hard you can exercise, especially at first, but everyone can benefit. Talk with your health care provider about how much exercise is safe for you, and which activities and exercises are safe for you. What actions can I take to prevent breathing problems during physical activity? Sign up for a pulmonary rehabilitation program. This type of program may include: Education about lung diseases. Exercise classes that teach you how to exercise and be more active while improving your breathing. This usually involves: Exercise using your lower extremities, such as a stationary bicycle. About 30 minutes of exercise, 2 to 5 times per week, for 6 to 12 weeks. Strength training, such as push-ups or leg lifts. Nutrition  education. Group classes in which you can talk with others who also have COPD and learn ways to manage stress. If you use an oxygen tank, you should use it while you exercise. Work with your health care provider to adjust your oxygen for your physical activity. Your resting flow rate is different from your flow rate during physical activity. How to manage your breathing while exercising While you are exercising: Take slow breaths. Pace yourself, and do nottry to go too fast. Purse your lips while breathing out. Pursing your lips is similar to a kissing or whistling position. If doing exercise that uses a quick burst of effort, such as weight lifting: Breathe in before starting the exercise. Breathe out during the hardest part of the exercise, such as raising the weights. Where to find support You can find support for exercising with COPD from: Your health care provider. A pulmonary rehabilitation program. Your local health department or community health programs. Support groups, either online or in-person. Your health care provider may be able to recommend support groups. Where to find more information You can find more information about exercising with COPD from: American Lung Association: lung.org COPD Foundation: copdfoundation.org Contact a health care provider if: Your symptoms get worse. You have nausea. You have a fever. You want to start a new exercise program or a new activity. Get help right away if: You have chest pain. You cannot breathe. These symptoms may represent a serious problem that is an emergency. Do not wait to see if the symptoms will go away. Get medical help right away. Call your local emergency services (911 in the [place] Do not drive yourself to the hospital. Summary COPD is a general term that can be used to describe many different lung problems that cause lung inflammation and limit airflow. This includes chronic bronchitis and emphysema. Exercise and  physical activity improve your shortness of breath by increasing blood flow (circulation). This causes your heart to provide more oxygen to your body. Contact your health care provider before starting any exercise program or new activity. Ask your health care provider what exercises and activities are safe for you. This information is not intended to replace advice given to you by your health care provider. Make sure you discuss any questions you have with your health care provider. Document Revised: 06/15/2020 Document Reviewed: 06/15/2020 Elsevier Patient Education  2022 [employer].

## 2021-09-29 NOTE — Progress Notes (Signed)
Subjective:   Kimberly Obrien is a 61 y.o. female who presents for Medicare Annual (Subsequent) preventive examination.  Virtual Visit via Telephone Note  I connected with  Kimberly Obrien on 09/29/21 at  2:45 PM EST by telephone and verified that I am speaking with the correct person using two identifiers.  Location: Patient: [facility] Provider: [facility] Persons participating in the virtual visit: patient/Nurse Health Advisor   I discussed the limitations, risks, security and privacy concerns of performing an evaluation and management service by telephone and the availability of in person appointments. The patient expressed understanding and agreed to proceed.  Interactive audio and video telecommunications were attempted between this nurse and patient, however failed, due to patient having technical difficulties OR patient did not have access to video capability.  We continued and completed visit with audio only.  Some vital signs may be absent or patient reported.   Cuong Moorman E Carren Blakley, LPN   Review of Systems     Cardiac Risk Factors include: obesity (BMI >30kg/m2);sedentary lifestyle;diabetes mellitus;dyslipidemia;hypertension;Other (see comment), Risk factor comments: COPD, OSA no CPAP     Objective:    Today's Vitals   09/29/21 1435  Weight: 201 lb (91.2 kg)   Body mass index is 34.5 kg/m.  Advanced Directives 09/29/2021 06/24/2021 06/23/2021 09/28/2020 09/25/2019 07/22/2018 03/22/2018  Does Patient Have a Medical Advance Directive? No No No No No No No  Would patient like information on creating a medical advance directive? No - Patient declined No - Patient declined No - Patient declined No - Patient declined No - Patient declined No - Patient declined No - Patient declined    Current Medications (verified) Outpatient Encounter Medications as of 09/29/2021  Medication Sig   acetaminophen (TYLENOL) 500 MG tablet Take 500 mg by mouth every 6 (six) hours as needed.   albuterol  (VENTOLIN HFA) 108 (90 Base) MCG/ACT inhaler Inhale 2 puffs into the lungs every 6 (six) hours as needed for wheezing or shortness of breath.   Blood Glucose Monitoring Suppl (ONETOUCH VERIO REFLECT) w/Device KIT Test BS twice daily Dx e11.9   fluticasone (FLONASE) 50 MCG/ACT nasal spray Place 2 sprays into both nostrils daily.   furosemide (LASIX) 20 MG tablet Take 1 tablet (20 mg total) by mouth as needed.   glucose blood (ONETOUCH VERIO) test strip Test BS twice daily Dx e11.9   ipratropium-albuterol (DUONEB) 0.5-2.5 (3) MG/3ML SOLN Take 3 mLs by nebulization every 6 (six) hours as needed.   Lancets (ONETOUCH DELICA PLUS PQAESL75P) MISC Test BS twice daily Dx e11.9   loperamide (IMODIUM A-D) 2 MG tablet Take 1/2 tablet every morning and then titrate as needed   metFORMIN (GLUCOPHAGE-XR) 750 MG 24 hr tablet Take 1 tablet (750 mg total) by mouth daily with breakfast.   methylPREDNISolone (MEDROL DOSEPAK) 4 MG TBPK tablet Take by mouth.   No facility-administered encounter medications on file as of 09/29/2021.    Allergies (verified) Aciphex [rabeprazole sodium], Fluconazole, Lansoprazole, Septra [sulfamethoxazole-trimethoprim], Sucralfate, Macrobid [nitrofurantoin], Vancomycin, Claritin [loratadine], Crestor [rosuvastatin], Doxycycline, Ibuprofen, Lipitor [atorvastatin], Metoprolol, Metronidazole, Nsaids, Omeprazole, and Rabeprazole   History: Past Medical History:  Diagnosis Date   Anemia    Anticoagulation monitoring by pharmacist    Arthritis    Asthma    Blood transfusion without reported diagnosis    Cataract    removed both eyes   Cirrhosis ([place])    COPD (chronic obstructive pulmonary disease) ([place])    Diabetes mellitus without complication ([place])    Dyspnea  exertion   Elevated hemoglobin A1c 03/01/2018   Family history of Hodgkin's lymphoma    Family history of leukemia    Family history of lung cancer    Family history of stomach cancer    Family history of uterine  cancer    GERD (gastroesophageal reflux disease)    mild- diet related   Heart murmur    History of colon polyps    History of pulmonary embolism 02/04/2013   Hyperlipidemia    controlled   NASH (nonalcoholic steatohepatitis)    Neuromuscular disorder ([place])    back surgery   PE (pulmonary embolism)    Sleep apnea    no cpap   Past Surgical History:  Procedure Laterality Date   ABDOMINAL HYSTERECTOMY  2009   BREAST BIOPSY Left    CATARACT EXTRACTION, BILATERAL Bilateral    CHOLECYSTECTOMY     COLONOSCOPY WITH PROPOFOL N/A 05/07/2017   Procedure: COLONOSCOPY WITH PROPOFOL;  Surgeon: Laurence Spates, MD;  Location: [facility];  Service: Endoscopy;  Laterality: N/A;   ESOPHAGOGASTRODUODENOSCOPY (EGD) WITH PROPOFOL N/A 05/07/2017   Procedure: ESOPHAGOGASTRODUODENOSCOPY (EGD) WITH PROPOFOL;  Surgeon: Laurence Spates, MD;  Location: [facility];  Service: Endoscopy;  Laterality: N/A;   INCONTINENCE SURGERY  2009   with hysterectomy   IR TRANSCATHETER BX  03/15/2018   IR VENOGRAM HEPATIC W HEMODYNAMIC EVALUATION  03/15/2018   LEFT AND RIGHT HEART CATHETERIZATION WITH CORONARY ANGIOGRAM N/A 05/13/2014   Procedure: LEFT AND RIGHT HEART CATHETERIZATION WITH CORONARY ANGIOGRAM;  Surgeon: [name], MD;  Location: [facility] CATH LAB;  Service: Cardiovascular;  Laterality: N/A;   SPINE SURGERY     L5 S1 fusion x4   TUBAL LIGATION     WRIST SURGERY Right    Had surgery twice to shave bone for blood circulation improvement.   Family History  Problem Relation Age of Onset   Cirrhosis Mother        NASH   Irritable bowel syndrome Mother    Hyperlipidemia Father    Heart failure Father    Bipolar disorder Father    Heart disease Father    Obesity Daughter    Endometrial cancer Daughter 67   Hypertension Brother    Colon polyps Sister        unknown number but fewer than patient   Irritable bowel syndrome Sister    Heart failure Paternal Grandfather    Stomach cancer Maternal  Uncle        dx 20s/50s   Cancer Paternal Aunt        NOS   Cancer Paternal Uncle        NOS   Lung cancer Maternal Uncle        dx 40s/50s, smoker   Cancer Maternal Uncle        NOS   Cancer Paternal Aunt        NOS   Leukemia Cousin        dx 58s, died 2 weeks after diagnosis (maternal first cousin)   Cancer Cousin        NOS (paternal first cousin)   Hodgkin's lymphoma Other 25       4th degree relative (mother's half-sibling's son)   Heart attack Neg Hx    Miscarriages / Stillbirths Neg Hx    Colon cancer Neg Hx    Esophageal cancer Neg Hx    Rectal cancer Neg Hx    Social History   Socioeconomic History   Marital status: Married  Spouse name: Shanon Brow   Number of children: 4   Years of education: GED   Highest education level: GED or equivalent  Occupational History   Occupation: Unemployed  Tobacco Use   Smoking status: Former    Packs/day: 2.00    Years: 50.00    Pack years: 100.00    Types: Cigarettes    Start date: 08/22/1977    Quit date: 04/21/2012    Years since quitting: 9.4   Smokeless tobacco: Never  Vaping Use   Vaping Use: Never used  Substance and Sexual Activity   Alcohol use: No   Drug use: No   Sexual activity: Not Currently    Birth control/protection: Surgical    Comment: tubal and [facility]  Other Topics Concern   Not on file  Social History Narrative   Not on file   Social Determinants of Health   Financial Resource Strain: Low Risk    Difficulty of Paying Living Expenses: Not very hard  Food Insecurity: No Food Insecurity   Worried About Charity fundraiser in the Last Year: Never true   [place] in the Last Year: Never true  Transportation Needs: No Transportation Needs   Lack of Transportation (Medical): No   Lack of Transportation (Non-Medical): No  Physical Activity: Inactive   Days of Exercise per Week: 0 days   Minutes of Exercise per Session: 0 min  Stress: Stress Concern Present   Feeling of Stress : To some extent   Social Connections: Moderately Isolated   Frequency of Communication with Friends and Family: Once a week   Frequency of Social Gatherings with Friends and Family: Once a week   Attends Religious Services: Never   Marine scientist or Organizations: Yes   Attends Music therapist: 1 to 4 times per year   Marital Status: Married    Tobacco Counseling Counseling given: Not Answered   Clinical Intake:  Pre-visit preparation completed: Yes  Pain : No/denies pain     BMI - recorded: 34.5 Nutritional Status: BMI > 30  Obese Nutritional Risks: None Diabetes: Yes CBG done?: No Did pt. bring in CBG monitor from home?: No  How often do you need to have someone help you when you read instructions, pamphlets, or other written materials from your doctor or pharmacy?: 1 - Never  Diabetic? no  Interpreter Needed?: No  Information entered by :: Kinze Labo, LPN   Activities of Daily Living In your present state of health, do you have any difficulty performing the following activities: 09/29/2021  Hearing? N  Vision? N  Difficulty concentrating or making decisions? N  Walking or climbing stairs? Y  Comment SOB, hurts back  Dressing or bathing? N  Doing errands, shopping? N  Preparing Food and eating ? N  Using the Toilet? N  In the past six months, have you accidently leaked urine? Y  Comment seeing Urology  Do you have problems with loss of bowel control? Y  Comment seeing GI  Managing your Medications? N  Managing your Finances? N  Housekeeping or managing your Housekeeping? N  Some recent data might be hidden    Patient Care Team: Claretta Fraise, MD as PCP - General (Family Medicine) [name], MD as PCP - Cardiology (Cardiology) [name], MD as Consulting Physician (Cardiology) Ilean China, RN as Case Manager Estill Dooms, NP as Nurse Practitioner (Obstetrics and Gynecology) Armbruster, Carlota Raspberry, MD as  Consulting Physician (Gastroenterology) Felipa Eth,  Reece Leader., MD as Referring Physician (Urology)  Indicate any recent Medical Services you may have received from other than [facility] providers in the past year (date may be approximate).     Assessment:   This is a routine wellness examination for Kimberly Obrien.  Hearing/Vision screen Hearing Screening - Comments:: Denies hearing difficulties   Vision Screening - Comments:: Wears rx glasses prn only - up to date with routine eye exams with [facility]  Dietary issues and exercise activities discussed: Current Exercise Habits: The patient does not participate in regular exercise at present, Exercise limited by: respiratory conditions(s);orthopedic condition(s)   Goals Addressed             This Visit's Progress    Exercise 150 min/wk Moderate Activity   Not on track      Depression Screen PHQ 2/9 Scores 09/29/2021 06/21/2021 05/31/2021 05/19/2021 05/19/2021 11/17/2020 09/28/2020  PHQ - 2 Score 2 3 0 1 0 0 0  PHQ- 9 Score '8 12 9 11 ' - - -    Fall Risk Fall Risk  09/29/2021 09/12/2021 06/21/2021 05/31/2021 05/19/2021  Falls in the past year? 0 0 0 0 0  Number falls in past yr: 0 - 0 - -  Injury with Fall? 0 - 0 - -  Risk for fall due to : No Fall Risks - No Fall Risks - -  Follow up Falls prevention discussed - - - -    FALL RISK PREVENTION PERTAINING TO THE HOME:  Any stairs in or around the home? No  If so, are there any without handrails? No  Home free of loose throw rugs in walkways, pet beds, electrical cords, etc? Yes  Adequate lighting in your home to reduce risk of falls? Yes   ASSISTIVE DEVICES UTILIZED TO PREVENT FALLS:  Life alert? No  Use of a cane, walker or w/c? No  Grab bars in the bathroom? No  Shower chair or bench in shower? No  Elevated toilet seat or a handicapped toilet? Yes   TIMED UP AND GO:  Was the test performed? No . Telephonic visit  Cognitive Function: Normal cognitive status assessed by direct  observation by this Nurse Health Advisor. No abnormalities found.   MMSE - Mini Mental State Exam 12/20/2017 08/09/2016 07/19/2015  Orientation to time '5 5 5  ' Orientation to Place '5 5 5  ' Registration '3 3 3  ' Attention/ Calculation '5 5 5  ' Recall '3 3 3  ' Language- name 2 objects '2 2 2  ' Language- repeat '1 1 1  ' Language- follow 3 step command '3 3 3  ' Language- read & follow direction '1 1 1  ' Write a sentence '1 1 1  ' Copy design '1 1 1  ' Total score '30 30 30     ' 6CIT Screen 09/28/2020 09/25/2019  What Year? 0 points 0 points  What month? 0 points 0 points  What time? 0 points 0 points  Count back from 20 0 points 0 points  Months in reverse 0 points 0 points  Repeat phrase 0 points 0 points  Total Score 0 0    Immunizations Immunization History  Administered Date(s) Administered   Tdap 04/21/2011, 04/20/2017    TDAP status: Up to date  Flu Vaccine status: Declined, Education has been provided regarding the importance of this vaccine but patient still declined. Advised may receive this vaccine at local pharmacy or Health Dept. Aware to provide a copy of the vaccination record if obtained from local pharmacy or Health Dept. Verbalized  acceptance and understanding.  Pneumococcal vaccine status: Declined,  Education has been provided regarding the importance of this vaccine but patient still declined. Advised may receive this vaccine at local pharmacy or Health Dept. Aware to provide a copy of the vaccination record if obtained from local pharmacy or Health Dept. Verbalized acceptance and understanding.   Covid-19 vaccine status: Declined, Education has been provided regarding the importance of this vaccine but patient still declined. Advised may receive this vaccine at local pharmacy or Health Dept.or vaccine clinic. Aware to provide a copy of the vaccination record if obtained from local pharmacy or Health Dept. Verbalized acceptance and understanding.  Qualifies for Shingles Vaccine? Yes    Zostavax completed No   Shingrix Completed?: No.    Education has been provided regarding the importance of this vaccine. Patient has been advised to call insurance company to determine out of pocket expense if they have not yet received this vaccine. Advised may also receive vaccine at local pharmacy or Health Dept. Verbalized acceptance and understanding.  Screening Tests Health Maintenance  Topic Date Due   URINE MICROALBUMIN  08/18/2021   COVID-19 Vaccine (1) 10/15/2021 (Originally 02/19/1961)   INFLUENZA VACCINE  11/18/2021 (Originally 03/21/2021)   Zoster Vaccines- Shingrix (1 of 2) 12/27/2021 (Originally 08/22/2010)   MAMMOGRAM  08/17/2022   DEXA SCAN  11/19/2022   PAP SMEAR-Modifier  05/31/2024   COLONOSCOPY (Pts 45-59yr Insurance coverage will need to be confirmed)  09/06/2024   TETANUS/TDAP  04/21/2027   Hepatitis C Screening  Completed   HIV Screening  Completed   HPV VACCINES  Aged Out    Health Maintenance  Health Maintenance Due  Topic Date Due   URINE MICROALBUMIN  08/18/2021    Colorectal cancer screening: Type of screening: Colonoscopy. Completed 09/06/2021. Repeat every 3 years  Mammogram status: Completed 08/17/2021. Repeat every year  Bone Density status: Completed 11/18/2020. Results reflect: Bone density results: OSTEOPENIA. Repeat every 2 years.  Lung Cancer Screening: (Low Dose CT Chest recommended if Age 61-80years, 30 pack-year currently smoking OR have quit w/in 15years.) does not qualify.   Additional Screening:  Hepatitis C Screening: does qualify; Completed 12/17/2012  Vision Screening: Recommended annual ophthalmology exams for early detection of glaucoma and other disorders of the eye. Is the patient up to date with their annual eye exam?  Yes  Who is the provider or what is the name of the office in which the patient attends annual eye exams? [facility] pt is not established with a provider, would they like to be referred to a provider to  establish care? No .   Dental Screening: Recommended annual dental exams for proper oral hygiene  Community Resource Referral / Chronic Care Management: CRR required this visit?  No   CCM required this visit?  No      Plan:     I have personally reviewed and noted the following in the patients chart:   Medical and social history Use of alcohol, tobacco or illicit drugs  Current medications and supplements including opioid prescriptions.  Functional ability and status Nutritional status Physical activity Advanced directives List of other physicians Hospitalizations, surgeries, and ER visits in previous 12 months Vitals Screenings to include cognitive, depression, and falls Referrals and appointments  In addition, I have reviewed and discussed with patient certain preventive protocols, quality metrics, and best practice recommendations. A written personalized care plan for preventive services as well as general preventive health recommendations were provided to patient.   Due  to this being a telephonic visit, the after visit summary with patients personalized plan was offered to patient via mail or my-chart. Patient would like to access on my-chart  Sandrea Hammond, LPN   09/29/209   Nurse Notes: None

## 2021-10-11 ENCOUNTER — Telehealth: Payer: Self-pay | Admitting: Hematology

## 2021-10-11 NOTE — Telephone Encounter (Signed)
R/s per pt request per 2/20 inbasket, pt aware

## 2021-10-12 ENCOUNTER — Ambulatory Visit: Payer: Medicare Other | Admitting: Hematology

## 2021-10-12 ENCOUNTER — Other Ambulatory Visit: Payer: Medicare Other

## 2021-10-20 SURGERY — Surgical Case
Anesthesia: *Unknown

## 2021-10-21 ENCOUNTER — Ambulatory Visit: Payer: Medicare Other | Admitting: Hematology

## 2021-10-21 ENCOUNTER — Other Ambulatory Visit: Payer: Medicare Other

## 2021-10-31 ENCOUNTER — Other Ambulatory Visit: Payer: Self-pay

## 2021-10-31 DIAGNOSIS — I8500 Esophageal varices without bleeding: Secondary | ICD-10-CM

## 2021-10-31 DIAGNOSIS — K760 Fatty (change of) liver, not elsewhere classified: Secondary | ICD-10-CM

## 2021-10-31 DIAGNOSIS — K7460 Unspecified cirrhosis of liver: Secondary | ICD-10-CM

## 2021-10-31 NOTE — Progress Notes (Signed)
Patient due for RUQ u/s of liver. Scheduled her for Tuesday, 3-28 at 8:00am, to arr 7:45, NPO after midnight. MyChart message sent to pt ? ?

## 2021-11-15 ENCOUNTER — Ambulatory Visit (HOSPITAL_COMMUNITY)
Admission: RE | Admit: 2021-11-15 | Discharge: 2021-11-15 | Disposition: A | Discharge status: Home / Self Care | Payer: Medicare Other | Source: Ambulatory Visit | Attending: Gastroenterology | Admitting: Gastroenterology

## 2021-11-15 ENCOUNTER — Ambulatory Visit (HOSPITAL_COMMUNITY): Payer: Medicare Other

## 2021-11-15 ENCOUNTER — Other Ambulatory Visit: Payer: Self-pay

## 2021-11-15 DIAGNOSIS — K760 Fatty (change of) liver, not elsewhere classified: Secondary | ICD-10-CM | POA: Diagnosis not present

## 2021-11-15 DIAGNOSIS — K7460 Unspecified cirrhosis of liver: Secondary | ICD-10-CM | POA: Diagnosis not present

## 2021-11-15 DIAGNOSIS — Z9049 Acquired absence of other specified parts of digestive tract: Secondary | ICD-10-CM | POA: Diagnosis not present

## 2021-11-16 ENCOUNTER — Ambulatory Visit (INDEPENDENT_AMBULATORY_CARE_PROVIDER_SITE_OTHER): Payer: Medicare Other | Admitting: Family Medicine

## 2021-11-16 ENCOUNTER — Encounter: Payer: Self-pay | Admitting: Family Medicine

## 2021-11-16 VITALS — BP 119/60 | HR 77 | Temp 97.7°F | Ht 64.0 in | Wt 201.8 lb

## 2021-11-16 DIAGNOSIS — Z87891 Personal history of nicotine dependence: Secondary | ICD-10-CM

## 2021-11-16 DIAGNOSIS — E119 Type 2 diabetes mellitus without complications: Secondary | ICD-10-CM | POA: Diagnosis not present

## 2021-11-16 DIAGNOSIS — Z122 Encounter for screening for malignant neoplasm of respiratory organs: Secondary | ICD-10-CM | POA: Diagnosis not present

## 2021-11-16 DIAGNOSIS — E785 Hyperlipidemia, unspecified: Secondary | ICD-10-CM

## 2021-11-16 DIAGNOSIS — D500 Iron deficiency anemia secondary to blood loss (chronic): Secondary | ICD-10-CM

## 2021-11-16 DIAGNOSIS — G4733 Obstructive sleep apnea (adult) (pediatric): Secondary | ICD-10-CM

## 2021-11-16 LAB — BAYER DCA HB A1C WAIVED: HB A1C (BAYER DCA - WAIVED): 5.2 % (ref 4.8–5.6)

## 2021-11-16 MED ORDER — METFORMIN HCL ER 750 MG PO TB24: 750.0000 mg | ORAL_TABLET | Freq: Every day | ORAL | 3 refills | Status: DC

## 2021-11-16 NOTE — Progress Notes (Signed)
? ?Subjective:  ?Patient ID: Kimberly Obrien, female    DOB: May 03, 1961  Age: 61 y.o. MRN: [id number] ? ?CC: Medical Management of Chronic Issues ? ? ?HPI ?Kimberly Obrien presents for check up. ? ?Not sleeping well  Can't tolerate mask for sleep apnea. LAcks energy to do things. Wants to wait before getting a new referral.  ? ?presents forFollow-up of diabetes. Patient checks blood sugar at home. Doing pretty good.  ? 115-20 fasting and not checking postprandial ?Patient denies symptoms such as polyuria, polydipsia, excessive hunger, nausea ?No significant hypoglycemic spells noted. ?Medications reviewed. Pt reports taking them regularly without complication/adverse reaction being reported today.  ?Checking feet daily. ?Last eye appt was recent. Denies diabetic retinopathy ? ? ?Wants lung scan due to heavy smoking quit 9 years ago after 40 pack year hx. ? ? ?  11/16/2021  ? 12:58 PM 11/16/2021  ? 12:51 PM 09/29/2021  ?  2:39 PM  ?Depression screen PHQ 2/9  ?Decreased Interest 1 0 2  ?Down, Depressed, Hopeless 0 0 0  ?PHQ - 2 Score 1 0 2  ?Altered sleeping 3  3  ?Tired, decreased energy 3  2  ?Change in appetite 0  0  ?Feeling bad or failure about yourself  0  0  ?Trouble concentrating 3  1  ?Moving slowly or fidgety/restless 0  0  ?Suicidal thoughts 0  0  ?PHQ-9 Score 10  8  ?Difficult doing work/chores Somewhat difficult    ? ? ?History ?Kimberly Obrien has a past medical history of Anemia, Anticoagulation monitoring by pharmacist, Arthritis, Asthma, Blood transfusion without reported diagnosis, Cataract, Cirrhosis ([place]), COPD (chronic obstructive pulmonary disease) ([place]), Diabetes mellitus without complication ([place]), Dyspnea, Elevated hemoglobin A1c (03/01/2018), Family history of Hodgkin's lymphoma, Family history of leukemia, Family history of lung cancer, Family history of stomach cancer, Family history of uterine cancer, GERD (gastroesophageal reflux disease), Heart murmur, History of colon polyps, History of  pulmonary embolism (02/04/2013), Hyperlipidemia, NASH (nonalcoholic steatohepatitis), Neuromuscular disorder ([place]), PE (pulmonary embolism), and Sleep apnea.  ? ?She has a past surgical history that includes Tubal ligation; Wrist surgery (Right); Cholecystectomy; Spine surgery; Abdominal hysterectomy (2009); left and right heart catheterization with coronary angiogram (N/A, 05/13/2014); Incontinence surgery (2009); Cataract extraction, bilateral (Bilateral); Esophagogastroduodenoscopy (egd) with propofol (N/A, 05/07/2017); Colonoscopy with propofol (N/A, 05/07/2017); IR Venogram Hepatic W Hemodynamic Evaluation (03/15/2018); IR Transcatheter BX (03/15/2018); and Breast biopsy (Left).  ? ?Her family history includes Bipolar disorder in her father; Cancer in her cousin, maternal uncle, paternal aunt, paternal aunt, and paternal uncle; Cirrhosis in her mother; Colon polyps in her sister; Endometrial cancer (age of onset: 16) in her daughter; Heart disease in her father; Heart failure in her father and paternal grandfather; Hodgkin's lymphoma (age of onset: 62) in an other family member; Hyperlipidemia in her father; Hypertension in her brother; Irritable bowel syndrome in her mother and sister; Leukemia in her cousin; Lung cancer in her maternal uncle; Obesity in her daughter; Stomach cancer in her maternal uncle.She reports that she quit smoking about 9 years ago. Her smoking use included cigarettes. She started smoking about 44 years ago. She has a 100.00 pack-year smoking history. She has never used smokeless tobacco. She reports that she does not drink alcohol and does not use drugs. ? ? ? ?ROS ?Review of Systems  ?Constitutional: Negative.   ?HENT: Negative.    ?Eyes:  Negative for visual disturbance.  ?Respiratory:  Negative for shortness of breath.   ?Cardiovascular:  Negative for chest pain.  ?  Gastrointestinal:  Positive for abdominal pain (chronic, working with GI specialist).  ?Musculoskeletal:  Negative for  arthralgias.  ? ?Objective:  ?BP 119/60   Pulse 77   Temp 97.7 ?F (36.5 ?C)   Ht 5' 4"  (1.626 m)   Wt 201 lb 12.8 oz (91.5 kg)   SpO2 93%   BMI 34.64 kg/m?  ? ?BP Readings from Last 3 Encounters:  ?11/16/21 119/60  ?09/19/21 125/72  ?09/13/21 122/62  ? ? ?Wt Readings from Last 3 Encounters:  ?11/16/21 201 lb 12.8 oz (91.5 kg)  ?09/29/21 201 lb (91.2 kg)  ?09/19/21 203 lb 8 oz (92.3 kg)  ? ? ? ?Physical Exam ?Constitutional:   ?   General: She is not in acute distress. ?   Appearance: She is well-developed.  ?HENT:  ?   Head: Normocephalic and atraumatic.  ?Eyes:  ?   Conjunctiva/sclera: Conjunctivae normal.  ?   Pupils: Pupils are equal, round, and reactive to light.  ?Neck:  ?   Thyroid: No thyromegaly.  ?Cardiovascular:  ?   Rate and Rhythm: Normal rate and regular rhythm.  ?   Heart sounds: Normal heart sounds. No murmur heard. ?Pulmonary:  ?   Effort: Pulmonary effort is normal. No respiratory distress.  ?   Breath sounds: Normal breath sounds. No wheezing or rales.  ?Abdominal:  ?   General: Bowel sounds are normal. There is no distension.  ?   Palpations: Abdomen is soft.  ?   Tenderness: There is no abdominal tenderness.  ?Musculoskeletal:     ?   General: Normal range of motion.  ?   Cervical back: Normal range of motion and neck supple.  ?Lymphadenopathy:  ?   Cervical: No cervical adenopathy.  ?Skin: ?   General: Skin is warm and dry.  ?Neurological:  ?   Mental Status: She is alert and oriented to person, place, and time.  ?Psychiatric:     ?   Behavior: Behavior normal.     ?   Thought Content: Thought content normal.     ?   Judgment: Judgment normal.  ? ? ? ? ?Assessment & Plan:  ? ?Kimberly Obrien was seen today for medical management of chronic issues. ? ?Diagnoses and all orders for this visit: ? ?Diabetes mellitus without complication ([place]) ?-     CBC with Differential/Platelet ?-     CMP14+EGFR ?-     Bayer DCA Hb A1c Waived ?-     metFORMIN (GLUCOPHAGE-XR) 750 MG 24 hr tablet; Take 1 tablet (750  mg total) by mouth daily with breakfast. ?-     Microalbumin / creatinine urine ratio ? ?Iron deficiency anemia due to chronic blood loss ?-     CBC with Differential/Platelet ? ?Hyperlipidemia, unspecified hyperlipidemia type ?-     Lipid panel ? ?Encounter for screening for malignant neoplasm of lung in former smoker who quit in past 15 years with 30 pack year history or greater ?-     CT CHEST LUNG CANCER SCREENING LOW DOSE WO CONTRAST; Future ? ?OSA (obstructive sleep apnea) ? ? ? ? ? ? ?I am having Mardene Celeste B. Vey maintain her acetaminophen, OneTouch Verio Reflect, furosemide, ipratropium-albuterol, albuterol, fluticasone, OneTouch Delica Plus [id number], OneTouch Verio, loperamide, methylPREDNISolone, and metFORMIN. ? ?Allergies as of 11/16/2021   ? ?   Reactions  ? Aciphex [rabeprazole Sodium] Rash  ? Fluconazole Rash, Hives  ? Lansoprazole Hives  ? Septra [sulfamethoxazole-trimethoprim] Nausea And Vomiting  ? "made me bleed"  ? Sucralfate  Nausea Only  ? Macrobid [nitrofurantoin] Diarrhea  ? Vancomycin Diarrhea  ? Claritin [loratadine] Other (See Comments)  ? Tired  ? Crestor [rosuvastatin] Other (See Comments)  ? Elevated blood glucose and abdominal pain  ? Doxycycline Rash  ? Ibuprofen Other (See Comments)  ? Upset stomach  ? Lipitor [atorvastatin] Other (See Comments)  ? Myalgias, leg pain.  Problems with pancreas, sugars went up also.  ? Metoprolol Nausea And Vomiting  ? Metronidazole Other (See Comments), Nausea Only  ? Dizziness, nausea, dry mouth and some shortness of breath  ? Nsaids Other (See Comments)  ? Upset stomach  ? Omeprazole   ? bloating  ? Rabeprazole Rash  ? ?  ? ?  ?Medication List  ?  ? ?  ? Accurate as of November 16, 2021  1:32 PM. If you have any questions, ask your nurse or doctor.  ?  ?  ? ?  ? ?acetaminophen 500 MG tablet ?Commonly known as: TYLENOL ?Take 500 mg by mouth every 6 (six) hours as needed. ?  ?albuterol 108 (90 Base) MCG/ACT inhaler ?Commonly known as: VENTOLIN  HFA ?Inhale 2 puffs into the lungs every 6 (six) hours as needed for wheezing or shortness of breath. ?  ?fluticasone 50 MCG/ACT nasal spray ?Commonly known as: FLONASE ?Place 2 sprays into both nostrils daily. ?  ?furo

## 2021-11-17 LAB — CMP14+EGFR
ALT: 25 IU/L (ref 0–32)
AST: 42 IU/L — ABNORMAL HIGH (ref 0–40)
Albumin/Globulin Ratio: 1.9 (ref 1.2–2.2)
Albumin: 3.8 g/dL (ref 3.8–4.8)
Alkaline Phosphatase: 86 IU/L (ref 44–121)
BUN/Creatinine Ratio: 13 (ref 12–28)
BUN: 7 mg/dL — ABNORMAL LOW (ref 8–27)
Bilirubin Total: 2.4 mg/dL — ABNORMAL HIGH (ref 0.0–1.2)
CO2: 23 mmol/L (ref 20–29)
Calcium: 8.7 mg/dL (ref 8.7–10.3)
Chloride: 107 mmol/L — ABNORMAL HIGH (ref 96–106)
Creatinine, Ser: 0.56 mg/dL — ABNORMAL LOW (ref 0.57–1.00)
Globulin, Total: 2 g/dL (ref 1.5–4.5)
Glucose: 127 mg/dL — ABNORMAL HIGH (ref 70–99)
Potassium: 3.9 mmol/L (ref 3.5–5.2)
Sodium: 142 mmol/L (ref 134–144)
Total Protein: 5.8 g/dL — ABNORMAL LOW (ref 6.0–8.5)
eGFR: 104 mL/min/{1.73_m2} (ref >59)

## 2021-11-17 LAB — LIPID PANEL
Chol/HDL Ratio: 2.4 ratio (ref 0.0–4.4)
Cholesterol, Total: 146 mg/dL (ref 100–199)
HDL: 60 mg/dL (ref >39)
LDL Chol Calc (NIH): 65 mg/dL (ref 0–99)
Triglycerides: 116 mg/dL (ref 0–149)
VLDL Cholesterol Cal: 21 mg/dL (ref 5–40)

## 2021-11-17 LAB — CBC WITH DIFFERENTIAL/PLATELET
Basophils Absolute: 0 10*3/uL (ref 0.0–0.2)
Basos: 0 %
EOS (ABSOLUTE): 0 10*3/uL (ref 0.0–0.4)
Eos: 1 %
Hematocrit: 40.2 % (ref 34.0–46.6)
Hemoglobin: 13.6 g/dL (ref 11.1–15.9)
Immature Grans (Abs): 0 10*3/uL (ref 0.0–0.1)
Immature Granulocytes: 0 %
Lymphocytes Absolute: 0.8 10*3/uL (ref 0.7–3.1)
Lymphs: 16 %
MCH: 27.9 pg (ref 26.6–33.0)
MCHC: 33.8 g/dL (ref 31.5–35.7)
MCV: 82 fL (ref 79–97)
Monocytes Absolute: 0.3 10*3/uL (ref 0.1–0.9)
Monocytes: 6 %
Neutrophils Absolute: 3.7 10*3/uL (ref 1.4–7.0)
Neutrophils: 77 %
Platelets: 91 10*3/uL — CL (ref 150–450)
RBC: 4.88 x10E6/uL (ref 3.77–5.28)
RDW: 14 % (ref 11.7–15.4)
WBC: 4.8 10*3/uL (ref 3.4–10.8)

## 2021-12-06 ENCOUNTER — Ambulatory Visit (INDEPENDENT_AMBULATORY_CARE_PROVIDER_SITE_OTHER): Payer: Medicare Other | Admitting: Family Medicine

## 2021-12-06 ENCOUNTER — Telehealth: Payer: Self-pay | Admitting: Family Medicine

## 2021-12-06 ENCOUNTER — Encounter: Payer: Self-pay | Admitting: Family Medicine

## 2021-12-06 ENCOUNTER — Other Ambulatory Visit: Payer: Self-pay | Admitting: Family Medicine

## 2021-12-06 DIAGNOSIS — J0140 Acute pansinusitis, unspecified: Secondary | ICD-10-CM | POA: Diagnosis not present

## 2021-12-06 MED ORDER — AMOXICILLIN-POT CLAVULANATE 875-125 MG PO TABS: 1.0000 | ORAL_TABLET | Freq: Two times a day (BID) | ORAL | 0 refills | Status: AC

## 2021-12-06 MED ORDER — ONETOUCH VERIO VI STRP: ORAL_STRIP | 3 refills | Status: DC

## 2021-12-06 MED ORDER — ONETOUCH DELICA PLUS LANCET33G MISC: 3 refills | Status: DC

## 2021-12-06 NOTE — Telephone Encounter (Signed)
Pt seen 11/16/2021 and says that her Lancets (ONETOUCH DELICA PLUS [id number]) MISC and glucose blood (ONETOUCH VERIO) test strip have not been sent to [facility] mail order. Pt is also wanting an update on her lung scan. Please call back ?

## 2021-12-06 NOTE — Telephone Encounter (Signed)
I submitted prescriptions for her supplies. The scan has been authorized and its status is "waiting to be scheduled." She may want to call and ask for [facility] ?

## 2021-12-06 NOTE — Progress Notes (Signed)
? ?Virtual Visit via telephone Note ?Due to COVID-19 pandemic this visit was conducted virtually. This visit type was conducted due to national recommendations for restrictions regarding the COVID-19 Pandemic (e.g. social distancing, sheltering in place) in an effort to limit this patient's exposure and mitigate transmission in our community. All issues noted in this document were discussed and addressed.  A physical exam was not performed with this format.  ? ?I connected with Kimberly Obrien on 12/06/2021 at 1345 by telephone and verified that I am speaking with the correct person using two identifiers. Kimberly Obrien is currently located at home and family is currently with them during visit. The provider, Michelle Rakes, FNP is located in their office at time of visit. ? ?I discussed the limitations, risks, security and privacy concerns of performing an evaluation and management service by virtual visit and the availability of in person appointments. I also discussed with the patient that there may be a patient responsible charge related to this service. The patient expressed understanding and agreed to proceed. ? ?Subjective:  ?Patient ID: Kimberly Obrien, female    DOB: 05/22/1961, 61 y.o.   MRN: [id number] ? ?Chief Complaint:  Sinusitis ? ? ?HPI: ?Kimberly Obrien is a 61 y.o. female presenting on 12/06/2021 for Sinusitis ? ? ?Pt reports ongoing sinus pressure and pain with rhinorrhea, congestion, and postnasal drainage for over 2 weeks. She has been using sudafed and Flonase without relief of symptoms. No fever, chills, weakness, confusion, or decreased urine output. ? ?Sinusitis ?This is a new problem. The current episode started 1 to 4 weeks ago. The problem has been gradually worsening since onset. There has been no fever. Associated symptoms include congestion, coughing, headaches, sinus pressure and a sore throat. Pertinent negatives include no chills, diaphoresis, ear pain, hoarse voice,  neck pain, shortness of breath, sneezing or swollen glands. Past treatments include spray decongestants and oral decongestants. The treatment provided no relief.  ? ? ?Relevant past medical, surgical, family, and social history reviewed and updated as indicated.  ?Allergies and medications reviewed and updated. ? ? ?Past Medical History:  ?Diagnosis Date  ? Anemia   ? Anticoagulation monitoring by pharmacist   ? Arthritis   ? Asthma   ? Blood transfusion without reported diagnosis   ? Cataract   ? removed both eyes  ? Cirrhosis ([facility])   ? COPD (chronic obstructive pulmonary disease) ([facility])   ? Diabetes mellitus without complication ([facility])   ? Dyspnea   ? exertion  ? Elevated hemoglobin A1c 03/01/2018  ? Family history of Hodgkin's lymphoma   ? Family history of leukemia   ? Family history of lung cancer   ? Family history of stomach cancer   ? Family history of uterine cancer   ? GERD (gastroesophageal reflux disease)   ? mild- diet related  ? Heart murmur   ? History of colon polyps   ? History of pulmonary embolism 02/04/2013  ? Hyperlipidemia   ? controlled  ? NASH (nonalcoholic steatohepatitis)   ? Neuromuscular disorder ([facility])   ? back surgery  ? PE (pulmonary embolism)   ? Sleep apnea   ? no cpap  ? ? ?Past Surgical History:  ?Procedure Laterality Date  ? ABDOMINAL HYSTERECTOMY  2009  ? BREAST BIOPSY Left   ? CATARACT EXTRACTION, BILATERAL Bilateral   ? CHOLECYSTECTOMY    ? COLONOSCOPY WITH PROPOFOL N/A 05/07/2017  ? Procedure: COLONOSCOPY WITH PROPOFOL;  Surgeon: Edwards, James, MD;  Location: [facility] ENDOSCOPY;    Service: Endoscopy;  Laterality: N/A;  ? ESOPHAGOGASTRODUODENOSCOPY (EGD) WITH PROPOFOL N/A 05/07/2017  ? Procedure: ESOPHAGOGASTRODUODENOSCOPY (EGD) WITH PROPOFOL;  Surgeon: Laurence Spates, MD;  Location: [facility] ENDOSCOPY;  Service: Endoscopy;  Laterality: N/A;  ? INCONTINENCE SURGERY  2009  ? with hysterectomy  ? IR TRANSCATHETER BX  03/15/2018  ? IR VENOGRAM HEPATIC W HEMODYNAMIC EVALUATION  03/15/2018  ? LEFT  AND RIGHT HEART CATHETERIZATION WITH CORONARY ANGIOGRAM N/A 05/13/2014  ? Procedure: LEFT AND RIGHT HEART CATHETERIZATION WITH CORONARY ANGIOGRAM;  Surgeon: [name], MD;  Location: [facility] CATH LAB;  Service: Cardiovascular;  Laterality: N/A;  ? SPINE SURGERY    ? L5 S1 fusion x4  ? TUBAL LIGATION    ? WRIST SURGERY Right   ? Had surgery twice to shave bone for blood circulation improvement.  ? ? ?Social History  ? ?Socioeconomic History  ? Marital status: Married  ?  Spouse name: Shanon Brow  ? Number of children: 4  ? Years of education: GED  ? Highest education level: GED or equivalent  ?Occupational History  ? Occupation: Unemployed  ?Tobacco Use  ? Smoking status: Former  ?  Packs/day: 2.00  ?  Years: 50.00  ?  Pack years: 100.00  ?  Types: Cigarettes  ?  Start date: 08/22/1977  ?  Quit date: 04/21/2012  ?  Years since quitting: 9.6  ? Smokeless tobacco: Never  ?Vaping Use  ? Vaping Use: Never used  ?Substance and Sexual Activity  ? Alcohol use: No  ? Drug use: No  ? Sexual activity: Not Currently  ?  Birth control/protection: Surgical  ?  Comment: tubal and [place]  ?Other Topics Concern  ? Not on file  ?Social History Narrative  ? Not on file  ? ?Social Determinants of Health  ? ?Financial Resource Strain: Low Risk   ? Difficulty of Paying Living Expenses: Not very hard  ?Food Insecurity: No Food Insecurity  ? Worried About Charity fundraiser in the Last Year: Never true  ? Ran Out of Food in the Last Year: Never true  ?Transportation Needs: No Transportation Needs  ? Lack of Transportation (Medical): No  ? Lack of Transportation (Non-Medical): No  ?Physical Activity: Inactive  ? Days of Exercise per Week: 0 days  ? Minutes of Exercise per Session: 0 min  ?Stress: Stress Concern Present  ? Feeling of Stress : To some extent  ?Social Connections: Moderately Isolated  ? Frequency of Communication with Friends and Family: Once a week  ? Frequency of Social Gatherings with Friends and Family: Once a week  ? Attends  Religious Services: Never  ? Active Member of Clubs or Organizations: Yes  ? Attends Archivist Meetings: 1 to 4 times per year  ? Marital Status: Married  ?Intimate Partner Violence: Not At Risk  ? Fear of Current or Ex-Partner: No  ? Emotionally Abused: No  ? Physically Abused: No  ? Sexually Abused: No  ? ? ?Outpatient Encounter Medications as of 12/06/2021  ?Medication Sig  ? amoxicillin-clavulanate (AUGMENTIN) 875-125 MG tablet Take 1 tablet by mouth 2 (two) times daily for 10 days.  ? acetaminophen (TYLENOL) 500 MG tablet Take 500 mg by mouth every 6 (six) hours as needed.  ? albuterol (VENTOLIN HFA) 108 (90 Base) MCG/ACT inhaler Inhale 2 puffs into the lungs every 6 (six) hours as needed for wheezing or shortness of breath.  ? Blood Glucose Monitoring Suppl (ONETOUCH VERIO REFLECT) w/Device KIT Test BS twice daily Dx e11.9  ?  fluticasone (FLONASE) 50 MCG/ACT nasal spray Place 2 sprays into both nostrils daily.  ? furosemide (LASIX) 20 MG tablet Take 1 tablet (20 mg total) by mouth as needed.  ? glucose blood (ONETOUCH VERIO) test strip Test BS twice daily Dx e11.9  ? ipratropium-albuterol (DUONEB) 0.5-2.5 (3) MG/3ML SOLN Take 3 mLs by nebulization every 6 (six) hours as needed.  ? Lancets (ONETOUCH DELICA PLUS [id number]) MISC Test BS twice daily Dx e11.9  ? loperamide (IMODIUM A-D) 2 MG tablet Take 1/2 tablet every morning and then titrate as needed  ? metFORMIN (GLUCOPHAGE-XR) 750 MG 24 hr tablet Take 1 tablet (750 mg total) by mouth daily with breakfast.  ? methylPREDNISolone (MEDROL DOSEPAK) 4 MG TBPK tablet Take by mouth.  ? ?No facility-administered encounter medications on file as of 12/06/2021.  ? ? ?Allergies  ?Allergen Reactions  ? Aciphex [Rabeprazole Sodium] Rash  ? Fluconazole Rash and Hives  ? Lansoprazole Hives  ? Septra [Sulfamethoxazole-Trimethoprim] Nausea And Vomiting  ?  "made me bleed"  ? Sucralfate Nausea Only  ? Macrobid [Nitrofurantoin] Diarrhea  ? Vancomycin Diarrhea  ?  Claritin [Loratadine] Other (See Comments)  ?  Tired ?  ? Crestor [Rosuvastatin] Other (See Comments)  ?  Elevated blood glucose and abdominal pain  ? Doxycycline Rash  ? Ibuprofen Other (See Comments)  ?  Up

## 2021-12-06 NOTE — Telephone Encounter (Signed)
Patient aware.

## 2021-12-19 ENCOUNTER — Encounter: Payer: Self-pay | Admitting: Adult Health

## 2021-12-19 ENCOUNTER — Ambulatory Visit: Payer: Medicare Other | Admitting: Adult Health

## 2021-12-19 VITALS — BP 125/70 | HR 78 | Ht 64.0 in | Wt 199.0 lb

## 2021-12-19 DIAGNOSIS — N72 Inflammatory disease of cervix uteri: Secondary | ICD-10-CM | POA: Diagnosis not present

## 2021-12-19 NOTE — Progress Notes (Signed)
?  Subjective:  ?  ? Patient ID: Kimberly Obrien, female   DOB: 17-May-1961, 61 y.o.   MRN: [id number] ? ?HPI ?Matteson is a 61 year old white female, married sp [facility], in for 3 month follow up on having had cervicitis and nabothian cyst removed and she has been good. No pain or bleeding.  ?PCP is Dr Livia Snellen ? ?Lab Results  ?Component Value Date  ? DIAGPAP  05/31/2021  ?  - Negative for Intraepithelial Lesions or Malignancy (NILM)  ? DIAGPAP - Benign reactive/reparative changes 05/31/2021  ?  ?Review of Systems ?No pain or bleeding ?Reviewed past medical,surgical, social and family history. Reviewed medications and allergies.  ?   ?Objective:  ? Physical Exam ?BP 125/70 (BP Location: Left Arm, Patient Position: Sitting, Cuff Size: Normal)   Pulse 78   Ht 5' 4"  (1.626 m)   Wt 199 lb (90.3 kg)   BMI 34.16 kg/m?   ?  Skin warm and dry.Pelvic: external genitalia is normal in appearance no lesions, vagina: white discharge without odor,urethra has no lesions or masses noted, cervix: bulbous,no cervicitis and no return on nabothian cyst, no CMT uterus is absent, adnexa: no masses or tenderness noted. Bladder is non tender and no masses felt. ?Fall risk is low ?Examination chaperoned by Levy Pupa LPN ? ?Assessment:  ?   ?1. Cervicitis,resolved. ?Call if any problems ?Can resume sexual activity  if desired  ?   ?Plan:  ?   ?Follow up prn  ?   ?

## 2022-01-03 NOTE — Progress Notes (Signed)
? ? ?Chief Complaint  ?Patient presents with  ? Follow-up  ?  palpitations  ? ?History of Present Illness: 61 yo female with history of autoimmune hepatitis, prior tobacco abuse, COPD/asthma, arthritis and pulmonary embolism who is here today for cardiac follow up. She had been followed in our [facility] office by Dr. Bronson Ing. I met her in February 2017. She has been evaluated over the years for tachycardia/palpitations/fatigue/dyspnea. Echo April 2015 showed normal LV systolic and diastolic function with no valve issues. Right and left heart cath 2015 with no evidence of CAD and normal PA pressures. Cardiac monitor 2016 with rare PVCS, PACs. She has no history of atrial fibrillation. CTA chest 2015 with no PE. She did have a PE in 1999 with recurrence in 2003. She remained on coumadin because of history of recurrent PE but this was stopped in September 2018 due to GI bleeding.  Colonoscopy September 2018 with 4 polyps removed and non bleeding internal hemorrhoids as well as gastritis. She has been seen in the Hematology. She has a history of prior tobacco abuse and COPD. She has seen Pulmonary but she is only felt to have mild COPD per pt. She was seen in our office November 2019 and had c/o dyspnea and palpitations. Echo December 2019 with ZOXW=96-04%, grade 1 diastolic dysfunction. No valve disease. Normal PA pressures. No pericardial effusion. Cardiac monitor December 2019 was normal.  ? ?She is here today for follow up. The patient denies any chest pain, dyspnea, lower extremity edema, orthopnea, PND, dizziness, near syncope or syncope. She felt her heart "fluttering" for about 15 seconds one week ago.  ? ?Primary Care Physician: Claretta Fraise, MD [facility]) ? ?Past Medical History:  ?Diagnosis Date  ? Anemia   ? Anticoagulation monitoring by pharmacist   ? Arthritis   ? Asthma   ? Blood transfusion without reported diagnosis   ? Cataract   ? removed both eyes  ? Cirrhosis ([place])   ? COPD (chronic  obstructive pulmonary disease) ([place])   ? Diabetes mellitus without complication ([place])   ? Dyspnea   ? exertion  ? Elevated hemoglobin A1c 03/01/2018  ? Family history of Hodgkin's lymphoma   ? Family history of leukemia   ? Family history of lung cancer   ? Family history of stomach cancer   ? Family history of uterine cancer   ? GERD (gastroesophageal reflux disease)   ? mild- diet related  ? Heart murmur   ? History of colon polyps   ? History of pulmonary embolism 02/04/2013  ? Hyperlipidemia   ? controlled  ? NASH (nonalcoholic steatohepatitis)   ? Neuromuscular disorder ([place])   ? back surgery  ? PE (pulmonary embolism)   ? Sleep apnea   ? no cpap  ? ? ?Past Surgical History:  ?Procedure Laterality Date  ? ABDOMINAL HYSTERECTOMY  2009  ? BREAST BIOPSY Left   ? CATARACT EXTRACTION, BILATERAL Bilateral   ? CHOLECYSTECTOMY    ? COLONOSCOPY WITH PROPOFOL N/A 05/07/2017  ? Procedure: COLONOSCOPY WITH PROPOFOL;  Surgeon: Laurence Spates, MD;  Location: [facility] ENDOSCOPY;  Service: Endoscopy;  Laterality: N/A;  ? ESOPHAGOGASTRODUODENOSCOPY (EGD) WITH PROPOFOL N/A 05/07/2017  ? Procedure: ESOPHAGOGASTRODUODENOSCOPY (EGD) WITH PROPOFOL;  Surgeon: Laurence Spates, MD;  Location: [facility] ENDOSCOPY;  Service: Endoscopy;  Laterality: N/A;  ? INCONTINENCE SURGERY  2009  ? with hysterectomy  ? IR TRANSCATHETER BX  03/15/2018  ? IR VENOGRAM HEPATIC W HEMODYNAMIC EVALUATION  03/15/2018  ? LEFT AND RIGHT HEART CATHETERIZATION  WITH CORONARY ANGIOGRAM N/A 05/13/2014  ? Procedure: LEFT AND RIGHT HEART CATHETERIZATION WITH CORONARY ANGIOGRAM;  Surgeon: [name], MD;  Location: [facility] CATH LAB;  Service: Cardiovascular;  Laterality: N/A;  ? SPINE SURGERY    ? L5 S1 fusion x4  ? TUBAL LIGATION    ? WRIST SURGERY Right   ? Had surgery twice to shave bone for blood circulation improvement.  ? ? ?Current Outpatient Medications  ?Medication Sig Dispense Refill  ? acetaminophen (TYLENOL) 500 MG tablet Take 500 mg by mouth every 6 (six) hours  as needed.    ? albuterol (VENTOLIN HFA) 108 (90 Base) MCG/ACT inhaler Inhale 2 puffs into the lungs every 6 (six) hours as needed for wheezing or shortness of breath. 18 g 0  ? Blood Glucose Monitoring Suppl (ONETOUCH VERIO REFLECT) w/Device KIT Test BS twice daily Dx e11.9 1 kit 0  ? fluticasone (FLONASE) 50 MCG/ACT nasal spray Place 2 sprays into both nostrils daily. 16 g 11  ? furosemide (LASIX) 20 MG tablet Take 1 tablet (20 mg total) by mouth as needed. 30 tablet 3  ? glucose blood (ONETOUCH VERIO) test strip Test BS twice daily Dx e11.9 200 strip 3  ? ipratropium-albuterol (DUONEB) 0.5-2.5 (3) MG/3ML SOLN Take 3 mLs by nebulization every 6 (six) hours as needed. 360 mL 1  ? Lancets (ONETOUCH DELICA PLUS [id number]) MISC Test BS twice daily Dx e11.9 200 each 3  ? loperamide (IMODIUM A-D) 2 MG tablet Take 1/2 tablet every morning and then titrate as needed 30 tablet 0  ? metFORMIN (GLUCOPHAGE-XR) 750 MG 24 hr tablet Take 1 tablet (750 mg total) by mouth daily with breakfast. 90 tablet 3  ? ?No current facility-administered medications for this visit.  ? ? ?Allergies  ?Allergen Reactions  ? Aciphex [Rabeprazole Sodium] Rash  ? Fluconazole Rash and Hives  ? Lansoprazole Hives  ? Septra [Sulfamethoxazole-Trimethoprim] Nausea And Vomiting  ?  "made me bleed"  ? Sucralfate Nausea Only  ? Macrobid [Nitrofurantoin] Diarrhea  ? Vancomycin Diarrhea  ? Claritin [Loratadine] Other (See Comments)  ?  Tired ?  ? Crestor [Rosuvastatin] Other (See Comments)  ?  Elevated blood glucose and abdominal pain  ? Doxycycline Rash  ? Ibuprofen Other (See Comments)  ?  Upset stomach  ? Lipitor [Atorvastatin] Other (See Comments)  ?  Myalgias, leg pain.  Problems with pancreas, sugars went up also.  ? Metoprolol Nausea And Vomiting  ? Metronidazole Other (See Comments) and Nausea Only  ?  Dizziness, nausea, dry mouth and some shortness of breath  ? Nsaids Other (See Comments)  ?  Upset stomach  ? Omeprazole   ?  bloating  ? Rabeprazole  Rash  ? ? ?Social History  ? ?Socioeconomic History  ? Marital status: Married  ?  Spouse name: Shanon Brow  ? Number of children: 4  ? Years of education: GED  ? Highest education level: GED or equivalent  ?Occupational History  ? Occupation: Unemployed  ?Tobacco Use  ? Smoking status: Former  ?  Packs/day: 2.00  ?  Years: 50.00  ?  Pack years: 100.00  ?  Types: Cigarettes  ?  Start date: 08/22/1977  ?  Quit date: 04/21/2012  ?  Years since quitting: 9.7  ? Smokeless tobacco: Never  ?Vaping Use  ? Vaping Use: Never used  ?Substance and Sexual Activity  ? Alcohol use: No  ? Drug use: No  ? Sexual activity: Not Currently  ?  Birth control/protection: Surgical  ?  Comment: tubal and [place]  ?Other Topics Concern  ? Not on file  ?Social History Narrative  ? Not on file  ? ?Social Determinants of Health  ? ?Financial Resource Strain: Low Risk   ? Difficulty of Paying Living Expenses: Not very hard  ?Food Insecurity: No Food Insecurity  ? Worried About Charity fundraiser in the Last Year: Never true  ? Ran Out of Food in the Last Year: Never true  ?Transportation Needs: No Transportation Needs  ? Lack of Transportation (Medical): No  ? Lack of Transportation (Non-Medical): No  ?Physical Activity: Inactive  ? Days of Exercise per Week: 0 days  ? Minutes of Exercise per Session: 0 min  ?Stress: Stress Concern Present  ? Feeling of Stress : To some extent  ?Social Connections: Moderately Isolated  ? Frequency of Communication with Friends and Family: Once a week  ? Frequency of Social Gatherings with Friends and Family: Once a week  ? Attends Religious Services: Never  ? Active Member of Clubs or Organizations: Yes  ? Attends Archivist Meetings: 1 to 4 times per year  ? Marital Status: Married  ?Intimate Partner Violence: Not At Risk  ? Fear of Current or Ex-Partner: No  ? Emotionally Abused: No  ? Physically Abused: No  ? Sexually Abused: No  ? ? ?Family History  ?Problem Relation Age of Onset  ? Cirrhosis Mother   ?      NASH  ? Irritable bowel syndrome Mother   ? Hyperlipidemia Father   ? Heart failure Father   ? Bipolar disorder Father   ? Heart disease Father   ? Obesity Daughter   ? Endometrial cancer Daughter 75  ? Hypert

## 2022-01-04 ENCOUNTER — Ambulatory Visit (INDEPENDENT_AMBULATORY_CARE_PROVIDER_SITE_OTHER): Payer: Medicare Other

## 2022-01-04 ENCOUNTER — Encounter (HOSPITAL_COMMUNITY): Payer: Self-pay

## 2022-01-04 ENCOUNTER — Ambulatory Visit: Payer: Medicare Other | Admitting: Cardiovascular Disease

## 2022-01-04 ENCOUNTER — Encounter: Payer: Self-pay | Admitting: Cardiovascular Disease

## 2022-01-04 VITALS — BP 130/70 | HR 87 | Ht 64.0 in | Wt 205.0 lb

## 2022-01-04 DIAGNOSIS — R002 Palpitations: Secondary | ICD-10-CM | POA: Diagnosis not present

## 2022-01-04 DIAGNOSIS — R011 Cardiac murmur, unspecified: Secondary | ICD-10-CM

## 2022-01-04 NOTE — Patient Instructions (Signed)
Medication Instructions:  ?No changes today. ?*If you need a refill on your cardiac medications before your next appointment, please call your pharmacy* ? ? ?Lab Work: ?none ?If you have labs (blood work) drawn today and your tests are completely normal, you will receive your results only by: ?MyChart Message (if you have MyChart) OR ?A paper copy in the mail ?If you have any lab test that is abnormal or we need to change your treatment, we will call you to review the results. ? ? ?Testing/Procedures: ?Your physician has requested that you have an echocardiogram. Echocardiography is a painless test that uses sound waves to create images of your heart. It provides your doctor with information about the size and shape of your heart and how well your heart?s chambers and valves are working. This procedure takes approximately one hour. There are no restrictions for this procedure. ? ?3 Day Zio Heart Monitor (Patch) - applied in office ? ? ?Follow-Up: ?At [facility], you and your health needs are our priority.  As part of our continuing mission to provide you with exceptional heart care, we have created designated Provider Care Teams.  These Care Teams include your primary Cardiologist (physician) and Advanced Practice Providers (APPs -  Physician Assistants and Nurse Practitioners) who all work together to provide you with the care you need, when you need it. ? ?We recommend signing up for the patient portal called "MyChart".  Sign up information is provided on this After Visit Summary.  MyChart is used to connect with patients for Virtual Visits (Telemedicine).  Patients are able to view lab/test results, encounter notes, upcoming appointments, etc.  Non-urgent messages can be sent to your provider as well.   ?To learn more about what you can do with MyChart, go to [email or web address].   ? ?Your next appointment:   ?12 month(s) ? ?The format for your next appointment:   ?In Person ? ?Provider:   ?Lauree Chandler, MD   ? ? ? ?Important Information About Sugar ? ? ? ? ?  ?

## 2022-01-04 NOTE — Progress Notes (Signed)
[facility] referral received. Attempted to reach patient but was unable to do so. Detailed VM left asking that the patient return my call. ? ?

## 2022-01-04 NOTE — Progress Notes (Unsigned)
Applied a 3 day ZIo XT monitor to patient in the office  ?

## 2022-01-05 ENCOUNTER — Telehealth: Payer: Self-pay | Admitting: Family Medicine

## 2022-01-05 NOTE — Telephone Encounter (Signed)
REFERRAL REQUEST Telephone Note  Have you been seen at our office for this problem? yes (Advise that they may need an appointment with their PCP before a referral can be done)  Reason for Referral: lung cancer Referral discussed with patient: yes  Best contact number of patient for referral team: [phone]    Has patient been seen by a specialist for this issue before: yes  Patient provider preference for referral: no Patient location preference for referral: no   Patient notified that referrals can take up to a week or longer to process. If they haven't heard anything within a week they should call back and speak with the referral department.    Stacks' pt.

## 2022-01-06 ENCOUNTER — Encounter: Payer: Self-pay | Admitting: Family Medicine

## 2022-01-06 ENCOUNTER — Ambulatory Visit (INDEPENDENT_AMBULATORY_CARE_PROVIDER_SITE_OTHER): Payer: Medicare Other | Admitting: Family Medicine

## 2022-01-06 ENCOUNTER — Telehealth: Payer: Self-pay | Admitting: Family Medicine

## 2022-01-06 DIAGNOSIS — J069 Acute upper respiratory infection, unspecified: Secondary | ICD-10-CM | POA: Diagnosis not present

## 2022-01-06 DIAGNOSIS — U071 COVID-19: Secondary | ICD-10-CM | POA: Diagnosis not present

## 2022-01-06 MED ORDER — MOLNUPIRAVIR EUA 200MG CAPSULE: 4.0000 | ORAL_CAPSULE | Freq: Two times a day (BID) | ORAL | 0 refills | Status: AC

## 2022-01-06 MED ORDER — PREDNISONE 20 MG PO TABS: 40.0000 mg | ORAL_TABLET | Freq: Every day | ORAL | 0 refills | Status: AC

## 2022-01-06 NOTE — Addendum Note (Signed)
Addended by: Baruch Gouty on: 01/06/2022 02:03 PM   Modules accepted: Orders

## 2022-01-06 NOTE — Progress Notes (Signed)
Pt called back with reports of positive COVID testing at home. Will send in antiviral therapy due to age and multiple comorbidities.

## 2022-01-06 NOTE — Progress Notes (Signed)
Virtual Visit via telephone Note Due to COVID-19 pandemic this visit was conducted virtually. This visit type was conducted due to national recommendations for restrictions regarding the COVID-19 Pandemic (e.g. social distancing, sheltering in place) in an effort to limit this patient's exposure and mitigate transmission in our community. All issues noted in this document were discussed and addressed.  A physical exam was not performed with this format.   I connected with Kimberly Obrien on 01/06/2022 at 1048 by telephone and verified that I am speaking with the correct person using two identifiers. Kimberly Obrien is currently located at home and family is currently with them during visit. The provider, Monia Pouch, FNP is located in their office at time of visit.  I discussed the limitations, risks, security and privacy concerns of performing an evaluation and management service by virtual visit and the availability of in person appointments. I also discussed with the patient that there may be a patient responsible charge related to this service. The patient expressed understanding and agreed to proceed.  Subjective:  Patient ID: Kimberly Obrien, female    DOB: 1960-09-15, 61 y.o.   MRN: [id number]  Chief Complaint:  URI   HPI: [name] is a 61 y.o. female presenting on 01/06/2022 for URI   Pt reports URI symptoms for the last 3 days. States her husband has similar symptoms and was told he had influenza but was not tested. She would like to be tested as she has grandchildren she watches.   URI  This is a new problem. The current episode started in the past 7 days. The problem has been gradually improving. Associated symptoms include congestion, coughing, headaches, rhinorrhea and wheezing. Pertinent negatives include no abdominal pain, chest pain, diarrhea, dysuria, ear pain, joint pain, joint swelling, nausea, neck pain, plugged ear sensation, rash, sinus pain,  sneezing, sore throat, swollen glands or vomiting. She has tried acetaminophen and antihistamine for the symptoms. The treatment provided no relief.    Relevant past medical, surgical, family, and social history reviewed and updated as indicated.  Allergies and medications reviewed and updated.   Past Medical History:  Diagnosis Date   Anemia    Anticoagulation monitoring by pharmacist    Arthritis    Asthma    Blood transfusion without reported diagnosis    Cataract    removed both eyes   Cirrhosis ([place])    COPD (chronic obstructive pulmonary disease) ([place])    Diabetes mellitus without complication ([facility])    Dyspnea    exertion   Elevated hemoglobin A1c 03/01/2018   Family history of Hodgkin's lymphoma    Family history of leukemia    Family history of lung cancer    Family history of stomach cancer    Family history of uterine cancer    GERD (gastroesophageal reflux disease)    mild- diet related   Heart murmur    History of colon polyps    History of pulmonary embolism 02/04/2013   Hyperlipidemia    controlled   NASH (nonalcoholic steatohepatitis)    Neuromuscular disorder ([place])    back surgery   PE (pulmonary embolism)    Sleep apnea    no cpap    Past Surgical History:  Procedure Laterality Date   ABDOMINAL HYSTERECTOMY  2009   BREAST BIOPSY Left    CATARACT EXTRACTION, BILATERAL Bilateral    CHOLECYSTECTOMY     COLONOSCOPY WITH PROPOFOL N/A 05/07/2017   Procedure: COLONOSCOPY WITH PROPOFOL;  Surgeon: Oletta Lamas,  Jeneen Rinks, MD;  Location: [facility]OSCOPY;  Service: Endoscopy;  Laterality: N/A;   ESOPHAGOGASTRODUODENOSCOPY (EGD) WITH PROPOFOL N/A 05/07/2017   Procedure: ESOPHAGOGASTRODUODENOSCOPY (EGD) WITH PROPOFOL;  Surgeon: Laurence Spates, MD;  Location: [facility] ENDOSCOPY;  Service: Endoscopy;  Laterality: N/A;   INCONTINENCE SURGERY  2009   with hysterectomy   IR TRANSCATHETER BX  03/15/2018   IR VENOGRAM HEPATIC W HEMODYNAMIC EVALUATION  03/15/2018   LEFT AND RIGHT  HEART CATHETERIZATION WITH CORONARY ANGIOGRAM N/A 05/13/2014   Procedure: LEFT AND RIGHT HEART CATHETERIZATION WITH CORONARY ANGIOGRAM;  Surgeon: [name], MD;  Location: [facility] CATH LAB;  Service: Cardiovascular;  Laterality: N/A;   SPINE SURGERY     L5 S1 fusion x4   TUBAL LIGATION     WRIST SURGERY Right    Had surgery twice to shave bone for blood circulation improvement.    Social History   Socioeconomic History   Marital status: Married    Spouse name: Shanon Brow   Number of children: 4   Years of education: GED   Highest education level: GED or equivalent  Occupational History   Occupation: Unemployed  Tobacco Use   Smoking status: Former    Packs/day: 2.00    Years: 50.00    Pack years: 100.00    Types: Cigarettes    Start date: 08/22/1977    Quit date: 04/21/2012    Years since quitting: 9.7   Smokeless tobacco: Never  Vaping Use   Vaping Use: Never used  Substance and Sexual Activity   Alcohol use: No   Drug use: No   Sexual activity: Not Currently    Birth control/protection: Surgical    Comment: tubal and [facility]  Other Topics Concern   Not on file  Social History Narrative   Not on file   Social Determinants of Health   Financial Resource Strain: Low Risk    Difficulty of Paying Living Expenses: Not very hard  Food Insecurity: No Food Insecurity   Worried About Charity fundraiser in the Last Year: Never true   [place] in the Last Year: Never true  Transportation Needs: No Transportation Needs   Lack of Transportation (Medical): No   Lack of Transportation (Non-Medical): No  Physical Activity: Inactive   Days of Exercise per Week: 0 days   Minutes of Exercise per Session: 0 min  Stress: Stress Concern Present   Feeling of Stress : To some extent  Social Connections: Moderately Isolated   Frequency of Communication with Friends and Family: Once a week   Frequency of Social Gatherings with Friends and Family: Once a week   Attends Religious  Services: Never   Marine scientist or Organizations: Yes   Attends Music therapist: 1 to 4 times per year   Marital Status: Married  Human resources officer Violence: Not At Risk   Fear of Current or Ex-Partner: No   Emotionally Abused: No   Physically Abused: No   Sexually Abused: No    Outpatient Encounter Medications as of 01/06/2022  Medication Sig   predniSONE (DELTASONE) 20 MG tablet Take 2 tablets (40 mg total) by mouth daily with breakfast for 5 days.   acetaminophen (TYLENOL) 500 MG tablet Take 500 mg by mouth every 6 (six) hours as needed.   albuterol (VENTOLIN HFA) 108 (90 Base) MCG/ACT inhaler Inhale 2 puffs into the lungs every 6 (six) hours as needed for wheezing or shortness of breath.   Blood Glucose Monitoring Suppl (ONETOUCH VERIO  REFLECT) w/Device KIT Test BS twice daily Dx e11.9   fluticasone (FLONASE) 50 MCG/ACT nasal spray Place 2 sprays into both nostrils daily.   furosemide (LASIX) 20 MG tablet Take 1 tablet (20 mg total) by mouth as needed.   glucose blood (ONETOUCH VERIO) test strip Test BS twice daily Dx e11.9   ipratropium-albuterol (DUONEB) 0.5-2.5 (3) MG/3ML SOLN Take 3 mLs by nebulization every 6 (six) hours as needed.   Lancets (ONETOUCH DELICA PLUS [id number]) MISC Test BS twice daily Dx e11.9   loperamide (IMODIUM A-D) 2 MG tablet Take 1/2 tablet every morning and then titrate as needed   metFORMIN (GLUCOPHAGE-XR) 750 MG 24 hr tablet Take 1 tablet (750 mg total) by mouth daily with breakfast.   No facility-administered encounter medications on file as of 01/06/2022.    Allergies  Allergen Reactions   Aciphex [Rabeprazole Sodium] Rash   Fluconazole Rash and Hives   Lansoprazole Hives   Septra [Sulfamethoxazole-Trimethoprim] Nausea And Vomiting    "made me bleed"   Sucralfate Nausea Only   Macrobid [Nitrofurantoin] Diarrhea   Vancomycin Diarrhea   Claritin [Loratadine] Other (See Comments)    Tired    Crestor [Rosuvastatin] Other (See  Comments)    Elevated blood glucose and abdominal pain   Doxycycline Rash   Ibuprofen Other (See Comments)    Upset stomach   Lipitor [Atorvastatin] Other (See Comments)    Myalgias, leg pain.  Problems with pancreas, sugars went up also.   Metoprolol Nausea And Vomiting   Metronidazole Other (See Comments) and Nausea Only    Dizziness, nausea, dry mouth and some shortness of breath   Nsaids Other (See Comments)    Upset stomach   Omeprazole     bloating   Rabeprazole Rash    Review of Systems  Constitutional:  Positive for activity change and chills. Negative for appetite change, diaphoresis, fatigue, fever and unexpected weight change.  HENT:  Positive for congestion, postnasal drip and rhinorrhea. Negative for dental problem, drooling, ear discharge, ear pain, facial swelling, hearing loss, mouth sores, nosebleeds, sinus pressure, sinus pain, sneezing, sore throat, tinnitus, trouble swallowing and voice change.   Eyes:  Negative for photophobia and visual disturbance.  Respiratory:  Positive for cough and wheezing. Negative for apnea, choking, chest tightness, shortness of breath and stridor.   Cardiovascular:  Negative for chest pain, palpitations and leg swelling.  Gastrointestinal:  Negative for abdominal distention, abdominal pain, anal bleeding, blood in stool, constipation, diarrhea, nausea, rectal pain and vomiting.  Genitourinary:  Negative for decreased urine volume, difficulty urinating and dysuria.  Musculoskeletal:  Positive for myalgias. Negative for arthralgias, back pain, gait problem, joint pain, joint swelling and neck pain.  Skin:  Negative for rash.  Neurological:  Positive for headaches. Negative for dizziness, tremors, seizures, syncope, facial asymmetry, speech difficulty, weakness, light-headedness and numbness.  Psychiatric/Behavioral:  Negative for confusion.         Observations/Objective: No vital signs or physical exam, this was a virtual health  encounter.  Pt alert and oriented, answers all questions appropriately, and able to speak in full sentences.    Assessment and Plan: Shanika was seen today for uri.  Diagnoses and all orders for this visit:  URI with cough and congestion Would like to be tested for influenza as she cares for her grandchildren. Will place on prednisone due to slight wheezing. Continue symptomatic care at home. Report any new, worsening, or persistent symptoms.  -     predniSONE (DELTASONE) 20 MG  tablet; Take 2 tablets (40 mg total) by mouth daily with breakfast for 5 days. -     Veritor Flu A/B Waived     Follow Up Instructions: Return if symptoms worsen or fail to improve.    I discussed the assessment and treatment plan with the patient. The patient was provided an opportunity to ask questions and all were answered. The patient agreed with the plan and demonstrated an understanding of the instructions.   The patient was advised to call back or seek an in-person evaluation if the symptoms worsen or if the condition fails to improve as anticipated.  The above assessment and management plan was discussed with the patient. The patient verbalized understanding of and has agreed to the management plan. Patient is aware to call the clinic if they develop any new symptoms or if symptoms persist or worsen. Patient is aware when to return to the clinic for a follow-up visit. Patient educated on when it is appropriate to go to the emergency department.    I provided 15 minutes of time during this telephone encounter.   Monia Pouch, FNP-C [facility] [address] [place], [place] [address] [phone] 01/06/2022

## 2022-01-06 NOTE — Telephone Encounter (Signed)
Patient aware and verbalizes understanding. 

## 2022-01-10 ENCOUNTER — Other Ambulatory Visit (HOSPITAL_COMMUNITY): Payer: Self-pay

## 2022-01-10 ENCOUNTER — Encounter (HOSPITAL_COMMUNITY): Payer: Self-pay

## 2022-01-10 DIAGNOSIS — Z87891 Personal history of nicotine dependence: Secondary | ICD-10-CM

## 2022-01-10 DIAGNOSIS — Z122 Encounter for screening for malignant neoplasm of respiratory organs: Secondary | ICD-10-CM

## 2022-01-10 NOTE — Progress Notes (Signed)
Received referral for initial lung cancer screening scan. Contacted patient and obtained smoking history (started age 61, patient smoked 3PPD while smoking, former smoker having quit in 2013, 108 pack year) as well as answering questions related to the screening process. Patient denies signs/symptoms of lung cancer such as weight loss or hemoptysis. Patient denies comorbidity that would prevent curative treatment if lung cancer were to be found. Patient is scheduled for shared decision making visit and CT scan on 02/07/2022 at 1100.

## 2022-01-10 NOTE — Telephone Encounter (Signed)
It looks like she has been referred for Lung cancer screening, not for the diagnosis. The screening was authorized 2 months ago.

## 2022-01-11 ENCOUNTER — Telehealth: Payer: Self-pay | Admitting: Family Medicine

## 2022-01-12 ENCOUNTER — Other Ambulatory Visit: Payer: Self-pay | Admitting: Family Medicine

## 2022-01-12 DIAGNOSIS — U071 COVID-19: Secondary | ICD-10-CM

## 2022-01-12 DIAGNOSIS — J069 Acute upper respiratory infection, unspecified: Secondary | ICD-10-CM

## 2022-01-12 MED ORDER — BENZONATATE 100 MG PO CAPS: 200.0000 mg | ORAL_CAPSULE | Freq: Three times a day (TID) | ORAL | 0 refills | Status: DC | PRN

## 2022-01-12 NOTE — Telephone Encounter (Signed)
SPOKE WITH PATIENT, WANTS ANTIBIOTIC, ADVISED NOT INDICATED AT THIS TIME AND SHE REQUESTED TO SEE ANOTHER PROVIDER, APPOINTMENT SCHEDULED PER PATIENT REQUEST

## 2022-01-12 NOTE — Progress Notes (Signed)
Continued cough, will send in tessalon.

## 2022-01-13 ENCOUNTER — Ambulatory Visit: Payer: Medicare Other | Admitting: Family Medicine

## 2022-01-16 DIAGNOSIS — R011 Cardiac murmur, unspecified: Secondary | ICD-10-CM | POA: Diagnosis not present

## 2022-01-16 DIAGNOSIS — R002 Palpitations: Secondary | ICD-10-CM | POA: Diagnosis not present

## 2022-01-18 ENCOUNTER — Telehealth: Payer: Self-pay

## 2022-01-18 NOTE — Telephone Encounter (Signed)
Called and spoke with patient who states she was diagnosed with COVID the day after leaving office and was laying in bed the entire time she wore the monitor, so does not feel we got an accurate depiction of what her rhythm usually is. Moreover, pt expresses that she cannot walk from mailbox to house without being out of breath "due to my heart just racing." States "I think we need to do the test again down the road. A-fib runs in my family, my dad had it, brother had it, and grandpaw died from it, so it's there. But I guess if something's going to kill me, it might as well be this." Advised pt that regardless of her activity level, if she had a-fib the monitor would have picked it up and that increased HR is not associated with throwing someone into A-fib. Advised her to get well and completely healed from [place] which may be contributing to her SOB, and if she feels she needs to wear the monitor again, we can order it for her in the future.

## 2022-01-18 NOTE — Telephone Encounter (Signed)
From Dr. Angelena Form: Agree.   Thanks

## 2022-01-18 NOTE — Telephone Encounter (Signed)
-----   Message from [name], MD sent at 01/17/2022  1:13 PM EDT ----- Sinus with rare PACs, rare PVCs and 4 beat run of SVT. Can we reassure her that this is all benign? No evidence of atrial fib or flutter. Kimberly Obrien

## 2022-01-25 ENCOUNTER — Ambulatory Visit (HOSPITAL_COMMUNITY): Payer: Medicare Other | Attending: Cardiovascular Disease

## 2022-01-25 DIAGNOSIS — R011 Cardiac murmur, unspecified: Secondary | ICD-10-CM | POA: Diagnosis not present

## 2022-01-25 DIAGNOSIS — R002 Palpitations: Secondary | ICD-10-CM | POA: Insufficient documentation

## 2022-01-25 LAB — ECHOCARDIOGRAM COMPLETE
Area-P 1/2: 2.8 cm2
S' Lateral: 2.6 cm

## 2022-02-07 ENCOUNTER — Ambulatory Visit (HOSPITAL_COMMUNITY)
Admission: RE | Admit: 2022-02-07 | Discharge: 2022-02-07 | Disposition: A | Discharge status: Home / Self Care | Payer: Medicare Other | Source: Ambulatory Visit | Attending: Physician Assistant | Admitting: Physician Assistant

## 2022-02-07 ENCOUNTER — Inpatient Hospital Stay (HOSPITAL_COMMUNITY): Payer: Medicare Other | Attending: Physician Assistant | Admitting: Physician Assistant

## 2022-02-07 DIAGNOSIS — Z87891 Personal history of nicotine dependence: Secondary | ICD-10-CM | POA: Insufficient documentation

## 2022-02-07 DIAGNOSIS — Z122 Encounter for screening for malignant neoplasm of respiratory organs: Secondary | ICD-10-CM | POA: Diagnosis not present

## 2022-02-07 NOTE — Progress Notes (Signed)
[name]  Visit Date: 02/07/22   Visit Type: In-Person at [facility] SHARED DECISION-MAKING VISIT - Age: 61 y.o.  - Pack year smoking history: 108 pack-years  - Type of tobacco abuse: Cigarettes - Current smoker or < 15 years of cessation: Former smoker, quit in 2013 - No current symptoms of lung cancer:  Patient denies any hemoptysis, unintentional weight loss, and unexplained cough - Risks and benefits of lung cancer screening discussed: Negative: Over-diagnosis, radiation exposure, false positives, and additional testing Positive: Discover early stage lung cancer resulting in higher incidence of cure - Patient educated regarding the importance of adherence to continued lung cancer screening. - Currently, there are no co-morbidities to prevent treatment to therapy for lung cancer and the patient is agreeable to pursue treatment if a malignancy is discovered.  [place] Preventative Services Task Force recommend annual screening for lung cancer with low-dose CT in adults aged 29 - 68 years who have a 20+ pack year smoking history and currently smoke or have quit smoking within the past 15 years.  Screening should be discontinued once a person has not smoked for 15 years or develops a health problem that substantially limits life expectancy or the ability or willingness to have curative lung surgery.  It is a category B recommendation.  Similar stances are provided by CMS, NCCN, and AATS.  Social History   Tobacco Use   Smoking status: Former    Packs/day: 2.00    Years: 50.00    Total pack years: 100.00    Types: Cigarettes    Start date: 08/22/1977    Quit date: 04/21/2012    Years since quitting: 9.8   Smokeless tobacco: Never  Vaping Use   Vaping Use: Never used  Substance Use Topics   Alcohol use: No   Drug use: No     Personal history of tobacco use presenting hazards to health: - This patient meets criteria for low-dose CT lung cancer  screening  - The shared decision making visit discussion included risks and benefits of screening, potential for follow-up, diagnostic testing for abnormal scans, potential for false positive tests, overdiagnosis, discussion about total radiation exposure - Patient stated willingness to undergo diagnostics and treatment as needed - Patient was counseled on smoking cessation to decrease the  risk of lung cancer, pulmonary disease, heart disease, and stroke - Patient has been referred to Lung Cancer Screening Nurse Coordinator for further scheduling of LDCT and for further resources regarding free nicotine replacement therapy and information about smoking cessation classes - Counseling on the importance of adherence to annual lung cancer LDCT screening, impact of co-morbidities, and ability or willingness to undergo diagnosis and treatment is imperative for compliance of the program. - Counseling on the importance of continued smoking cessation for former smokers; the importance of smoking cessation for current smokers, and information about tobacco cessation interventions have been given to patient including [place] and 1-800-quit  programs.  Yearly follow up will be coordinated by Adonis Huguenin, RN, MSN [facility] Oncology Nurse Navigator.)  Harriett Rush, PA-C  02/07/22 12:08 PM

## 2022-02-13 ENCOUNTER — Encounter: Payer: Self-pay | Admitting: Family Medicine

## 2022-02-13 DIAGNOSIS — I700 Atherosclerosis of aorta: Secondary | ICD-10-CM | POA: Insufficient documentation

## 2022-02-20 IMAGING — CT CT ABD-PEL WO/W CM
3 of 12 series · 10 of 46 positions shown, 16 images · IV contrast (Omnipaque or Isovue)
Comparison: Multiple exams, including CT examinations from
06/23/2021 and 05/24/2020

CLINICAL DATA: Gross hematuria (although patient currently denies
hematuria). Frequent urinary tract infections. Lower pelvic pain the
last year. [place].

EXAM:
CT ABDOMEN AND PELVIS WITHOUT AND WITH CONTRAST
TECHNIQUE: Multidetector CT imaging of the abdomen and pelvis was performed
following the standard protocol before and following the bolus
administration of intravenous contrast.

[Series 7: axial post · axial · 0.82mm/px · z∈[+806,+1091]mm · 4 of 97 slices shown, 9 images]
[im 20/97  soft-tissue]
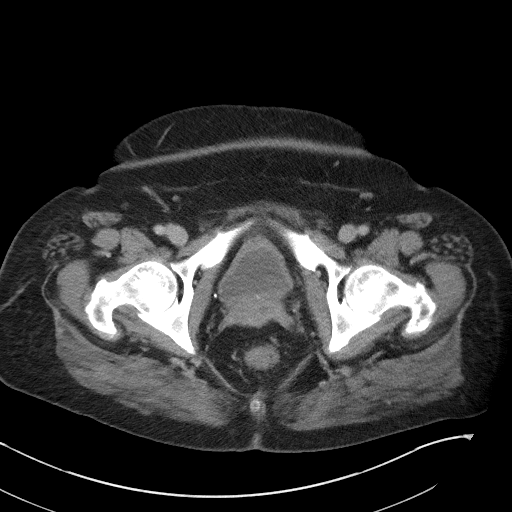
[im 20/97  lung]
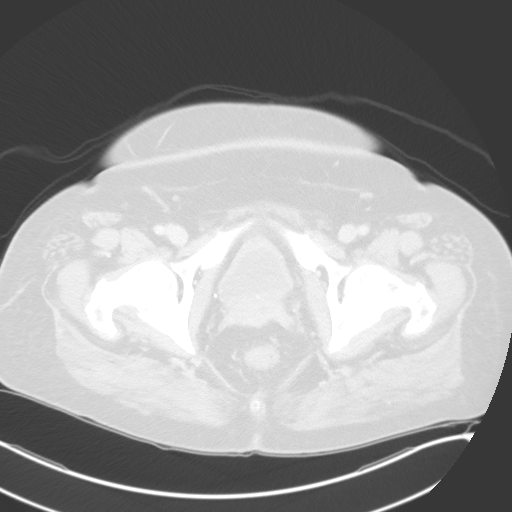
[im 20/97  bone]
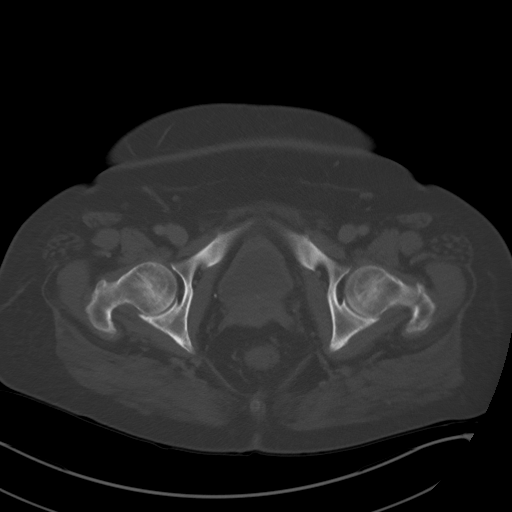
[im 39/97  soft-tissue]
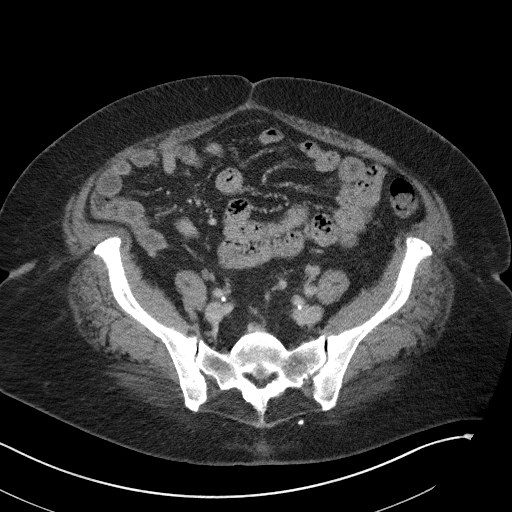
[im 39/97  lung]
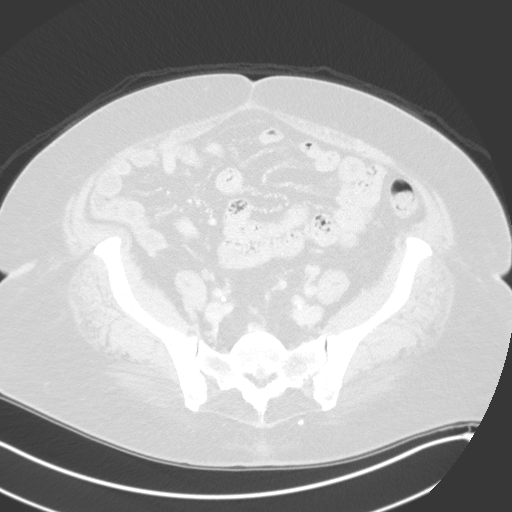
[im 58/97  soft-tissue]
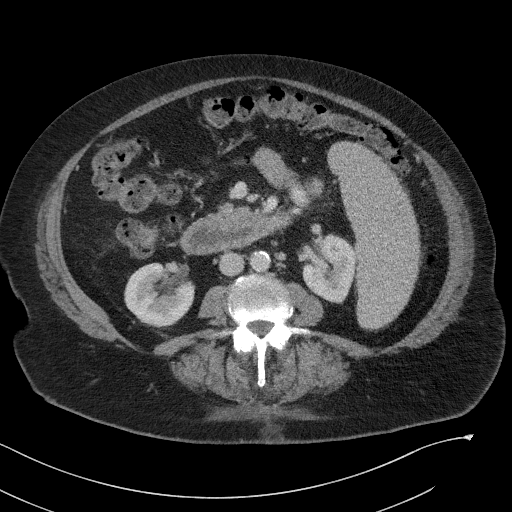
[im 58/97  lung]
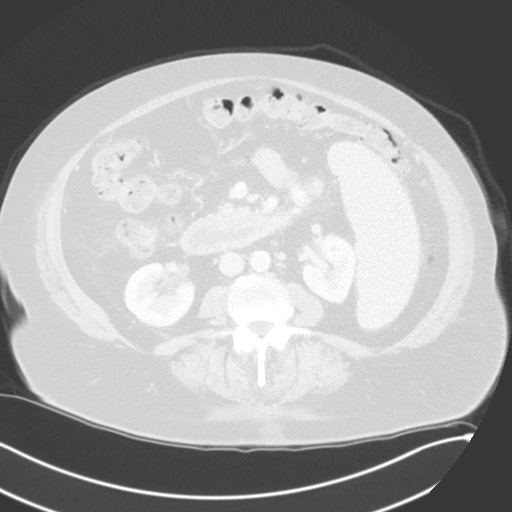
[im 77/97  soft-tissue]
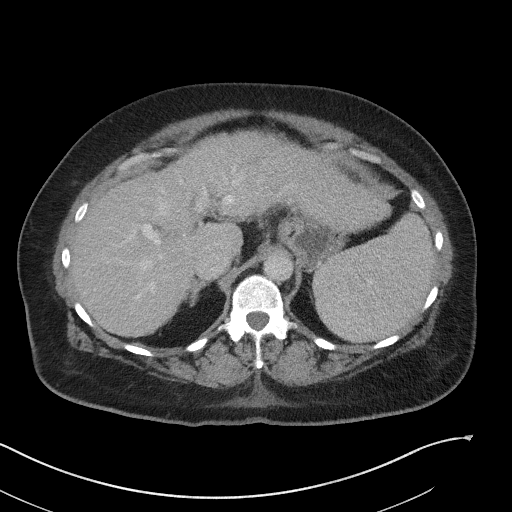
[im 77/97  lung]
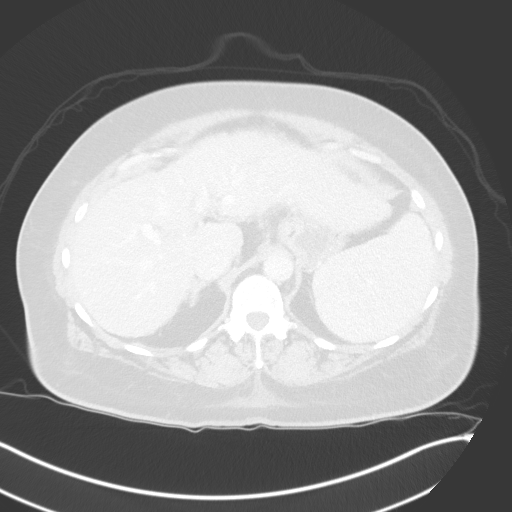

[Series 12: axial delay · axial · delayed · 0.82mm/px · z∈[+806,+1091]mm · 4 of 97 slices shown]
[im 20/97  soft-tissue]
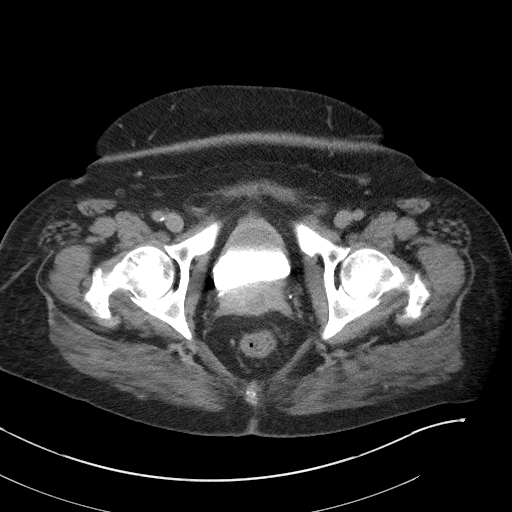
[im 39/97  soft-tissue]
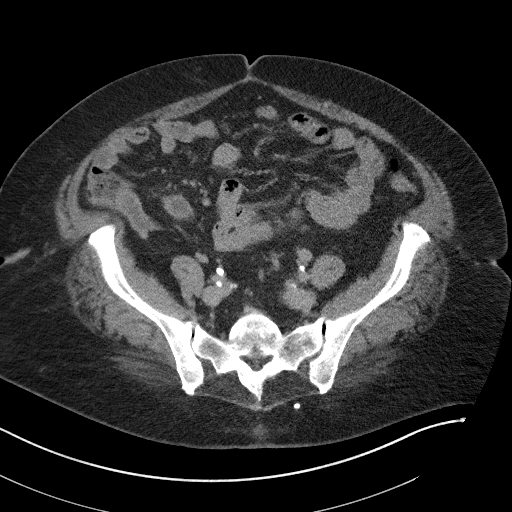
[im 58/97  soft-tissue]
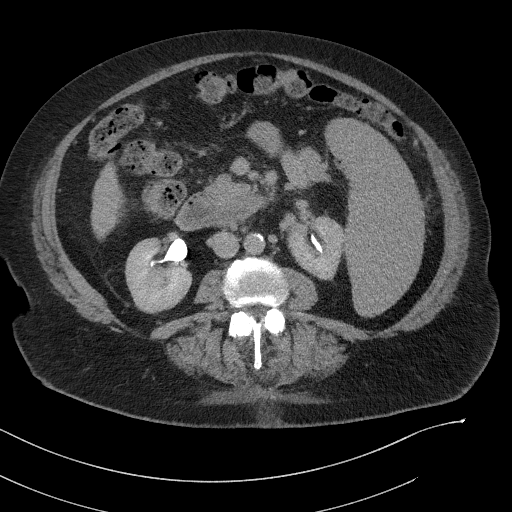
[im 77/97  soft-tissue]
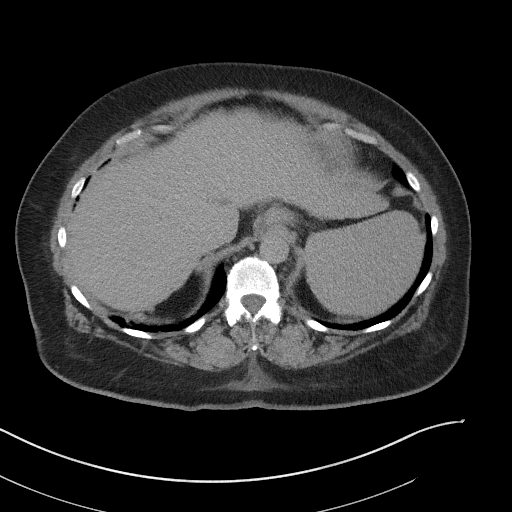

[Series 15: coronal delay · coronal · delayed · 1.00mm/px · 2 of 133 slices shown, 3 images]
[im 45/133  soft-tissue]
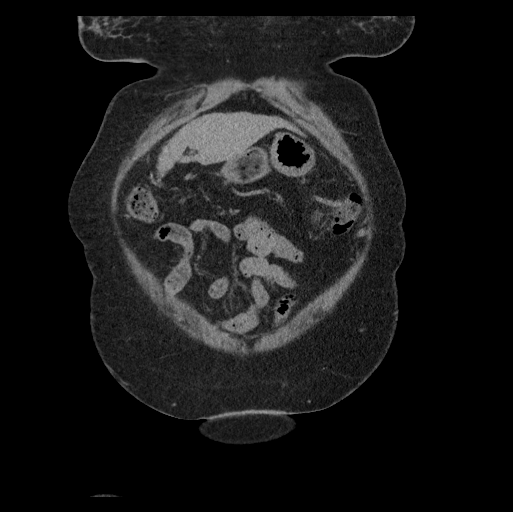
[im 45/133  bone]
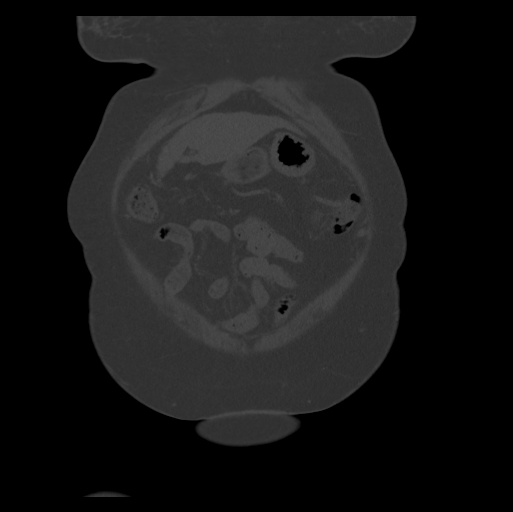
[im 89/133  soft-tissue]
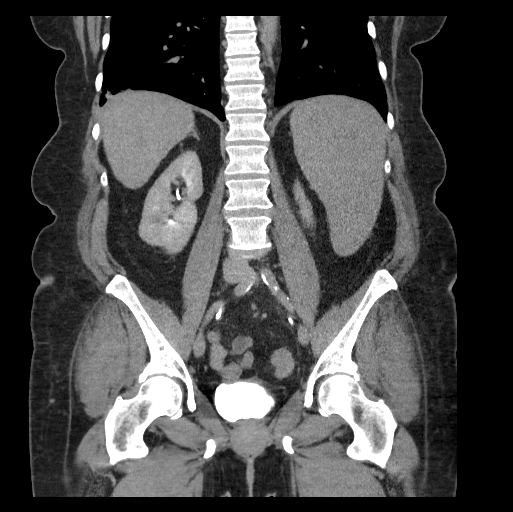

[10 of 46 positions shown; findings below may reference images not displayed]

RADIATION DOSE REDUCTION: This exam was performed according to the
departmental dose-optimization program which includes automated
exposure control, adjustment of the mA and/or kV according to
patient size and/or use of iterative reconstruction technique.

CONTRAST:  100mL OMNIPAQUE IOHEXOL 300 MG/ML  SOLN
FINDINGS: Lower chest: Centrilobular emphysema. 4 by 3 mm right lower lobe
pulmonary nodule anteriorly on image 27 series 4 common no change
from 08/10/2016, considered benign given the long-stability. Chronic
scarring posterolaterally at the right lung base. Mild chronic
lingular scarring. Mild subpleural nodularity laterally in the right
lower lobe on image 7 of series 14 and anteriorly in the right lower
lobe along the major fissure on image 10 of series 14, unchanged
from 05/23/2019, considered benign.

Hepatobiliary: Hepatic cirrhosis with nodular contour and relative
prominence the left hepatic lobe. Cholecystectomy. Stable mild
prominence of the common bile duct, probably a physiologic response
to cholecystectomy. I do not see a discrete liver mass.

Pancreas: Unremarkable

Spleen: Splenomegaly, with the spleen measuring 15.1 by 7.3 by
cm (volume = 9777 cm^3). No focal splenic mass identified.

Adrenals/Urinary Tract: The adrenal glands appear unremarkable. No
current urinary tract calculi or hydronephrosis. The prior left
proximal ureteral calculus is no longer seen.

0.9 by 0.5 by 0.9 cm fluid density lesion of the left posteromedial
mid kidney on image 67 series 10, also present on other exams such
as 05/25/2015, appearance strongly favors a benign cyst.

No appreciable abnormal enhancement or abnormal filling defect along
the urothelium. Small portions of the left ureter do not fill well
with contrast particularly distally.

No specific urinary bladder abnormality is observed.  No cystocele.

Stomach/Bowel: Unremarkable

Vascular/Lymphatic: Atherosclerosis is present, including aortoiliac
atherosclerotic disease. Porta hepatis/peripancreatic lymph node
cm in short axis on image 28 of series 7, stable back through
05/25/2015 and probably reactive to the patient's cirrhosis.

Reproductive: Hysterectomy, stable soft tissue prominence along the
vaginal cuff compared back through 9329 probably indicates partial
rather than complete hysterectomy. No adnexal abnormality observed.

Other: Stable 0.8 cm rim calcified structure along the right lower
quadrant omentum on image 55 series 7, likely from remote fat
necrosis common no change from 08/10/2016. No current ascites.

Musculoskeletal: There is some chronic laxity of the right posterior
pelvic floor for example on image 82 series 7.

Interbody fusion at L5-S1 posterior decompression at this level.
Grade 1 degenerative anterolisthesis at L4-5 potentially with mild
to moderate central narrowing of the thecal sac at this level.
IMPRESSION: 1. A specific cause for hematuria is not currently identified. Prior
left proximal ureteral calculus is no longer seen.
2. Other imaging findings of potential clinical significance: Aortic
Atherosclerosis (FD23H-G6C.C) and Emphysema (FD23H-7I8.O). Stable
small nodules at the right lung base, chronically unchanged,
considered benign. Hepatic cirrhosis with splenomegaly and portal
venous hypertension as well as chronic reactive lymph nodes along
the porta hepatis. Right posterior pelvic floor laxity but no
cystocele is present. Grade 1 degenerative anterolisthesis at L4-5
potentially with mild to moderate central narrowing of the thecal
sac at this level.

## 2022-03-01 ENCOUNTER — Ambulatory Visit: Payer: Self-pay | Admitting: *Deleted

## 2022-03-01 NOTE — Chronic Care Management (AMB) (Signed)
  Chronic Care Management   Note  03/01/2022 Name: Kimberly Obrien MRN: [id number] DOB: May 31, 1961   Patient is stable from [place] Management perspective or has not recently engaged with the [place]. I am removing RN Care Manager from Care Team and closing [place]. If patient is currently engaged with another CCM team member I will forward this encounter to inform them of my case closure. Patient may be eligible for re-engagement with RN Care Manager in the future if necessary and can discuss this with their PCP.  Chong Sicilian, BSN, RN-BC Embedded Chronic Care Manager [facility] [place] [facility] Direct Dial: [phone]

## 2022-03-13 ENCOUNTER — Encounter: Payer: Self-pay | Admitting: Gastroenterology

## 2022-05-02 ENCOUNTER — Telehealth: Payer: Self-pay

## 2022-05-02 DIAGNOSIS — K7460 Unspecified cirrhosis of liver: Secondary | ICD-10-CM

## 2022-05-02 NOTE — Telephone Encounter (Signed)
MyChart message sent to patient.

## 2022-05-02 NOTE — Telephone Encounter (Signed)
-----   Message from Kimberly Obrien, [place] sent at 11/16/2021  9:04 AM EDT ----- Regarding: due for RUQ in Sept Due for RUQ u/s for [facility] screening at the end of Sept

## 2022-05-05 NOTE — Telephone Encounter (Signed)
Message sent to radiology schedulers to call patient to get scheduled for RUQ U/S in September. MyChart message sent to pt

## 2022-05-05 NOTE — Telephone Encounter (Signed)
Schedulers to call patient to schedule RUQ U/S

## 2022-05-22 ENCOUNTER — Ambulatory Visit: Payer: Medicare Other | Admitting: Family Medicine

## 2022-05-30 ENCOUNTER — Ambulatory Visit (INDEPENDENT_AMBULATORY_CARE_PROVIDER_SITE_OTHER): Payer: Medicare Other | Admitting: Family Medicine

## 2022-05-30 ENCOUNTER — Encounter: Payer: Self-pay | Admitting: Family Medicine

## 2022-05-30 VITALS — BP 128/70 | HR 73 | Temp 98.8°F | Ht 64.0 in | Wt 204.0 lb

## 2022-05-30 DIAGNOSIS — D500 Iron deficiency anemia secondary to blood loss (chronic): Secondary | ICD-10-CM

## 2022-05-30 DIAGNOSIS — E119 Type 2 diabetes mellitus without complications: Secondary | ICD-10-CM

## 2022-05-30 DIAGNOSIS — D485 Neoplasm of uncertain behavior of skin: Secondary | ICD-10-CM

## 2022-05-30 LAB — BAYER DCA HB A1C WAIVED: HB A1C (BAYER DCA - WAIVED): 5.2 % (ref 4.8–5.6)

## 2022-05-30 MED ORDER — FEXOFENADINE-PSEUDOEPHED ER 180-240 MG PO TB24: 1.0000 | ORAL_TABLET | Freq: Every evening | ORAL | 11 refills | Status: DC

## 2022-05-30 MED ORDER — DICLOFENAC SODIUM 1 % EX GEL: 4.0000 g | Freq: Four times a day (QID) | CUTANEOUS | 5 refills | Status: DC

## 2022-05-30 MED ORDER — ONETOUCH VERIO VI STRP: ORAL_STRIP | 3 refills | Status: DC

## 2022-05-30 MED ORDER — ONETOUCH DELICA PLUS LANCET33G MISC: 3 refills | Status: DC

## 2022-05-30 NOTE — Progress Notes (Signed)
Subjective:  Patient ID: Kimberly Obrien, female    DOB: [birth year]  Age: 61 y.o. MRN: [id number]  CC: Medical Management of Chronic Issues (6 month f/u)   HPI ASHYAH QUIZON presents forFollow-up of diabetes. Patient denies symptoms such as polyuria, polydipsia, excessive hunger, nausea No significant hypoglycemic spells noted. Medications reviewed. Pt reports taking them regularly without complication/adverse reaction being reported today.   WAnts referral to derm for lesion. Also followed for iron deficiency. COPD stable with current regimen.    History Analy has a past medical history of Anemia, Anticoagulation monitoring by pharmacist, Arthritis, Asthma, Blood transfusion without reported diagnosis, Cataract, Cirrhosis ([place]), COPD (chronic obstructive pulmonary disease) ([place]), Diabetes mellitus without complication ([place]), Dyspnea, Elevated hemoglobin A1c (03/01/2018), Family history of Hodgkin's lymphoma, Family history of leukemia, Family history of lung cancer, Family history of stomach cancer, Family history of uterine cancer, GERD (gastroesophageal reflux disease), Heart murmur, History of colon polyps, History of pulmonary embolism (02/04/2013), Hyperlipidemia, NASH (nonalcoholic steatohepatitis), Neuromuscular disorder ([place]), PE (pulmonary embolism), Routine Papanicolaou smear (09/12/2021), Sleep apnea, Ureteral calculus, left (07/28/2021), and UTI symptoms (08/18/2021).   She has a past surgical history that includes Tubal ligation; Wrist surgery (Right); Cholecystectomy; Spine surgery; Abdominal hysterectomy (2009); left and right heart catheterization with coronary angiogram (N/A, 05/13/2014); Incontinence surgery (2009); Cataract extraction, bilateral (Bilateral); Esophagogastroduodenoscopy (egd) with propofol (N/A, 05/07/2017); Colonoscopy with propofol (N/A, 05/07/2017); IR Venogram Hepatic W Hemodynamic Evaluation (03/15/2018); IR Transcatheter BX (03/15/2018); and  Breast biopsy (Left).   Her family history includes Bipolar disorder in her father; Cancer in her cousin, maternal uncle, paternal aunt, paternal aunt, and paternal uncle; Cirrhosis in her mother; Colon polyps in her sister; Endometrial cancer (age of onset: 39) in her daughter; Heart disease in her father; Heart failure in her father and paternal grandfather; Hodgkin's lymphoma (age of onset: 83) in an other family member; Hyperlipidemia in her father; Hypertension in her brother; Irritable bowel syndrome in her mother and sister; Leukemia in her cousin; Lung cancer in her maternal uncle; Obesity in her daughter; Stomach cancer in her maternal uncle.She reports that she quit smoking about 10 years ago. Her smoking use included cigarettes. She started smoking about 44 years ago. She has a 100.00 pack-year smoking history. She has never used smokeless tobacco. She reports that she does not drink alcohol and does not use drugs.  Current Outpatient Medications on File Prior to Visit  Medication Sig Dispense Refill   acetaminophen (TYLENOL) 500 MG tablet Take 500 mg by mouth every 6 (six) hours as needed.     albuterol (VENTOLIN HFA) 108 (90 Base) MCG/ACT inhaler Inhale 2 puffs into the lungs every 6 (six) hours as needed for wheezing or shortness of breath. 18 g 0   Blood Glucose Monitoring Suppl (ONETOUCH VERIO REFLECT) w/Device KIT Test BS twice daily Dx e11.9 1 kit 0   fluticasone (FLONASE) 50 MCG/ACT nasal spray Place 2 sprays into both nostrils daily. 16 g 11   furosemide (LASIX) 20 MG tablet Take 1 tablet (20 mg total) by mouth as needed. 30 tablet 3   ipratropium-albuterol (DUONEB) 0.5-2.5 (3) MG/3ML SOLN Take 3 mLs by nebulization every 6 (six) hours as needed. 360 mL 1   metFORMIN (GLUCOPHAGE-XR) 750 MG 24 hr tablet Take 1 tablet (750 mg total) by mouth daily with breakfast. 90 tablet 3   No current facility-administered medications on file prior to visit.    ROS Review of Systems   Constitutional: Negative.   HENT: Negative.  Eyes:  Negative for visual disturbance.  Respiratory:  Negative for shortness of breath.   Cardiovascular:  Negative for chest pain.  Gastrointestinal:  Negative for abdominal pain.  Musculoskeletal:  Negative for arthralgias.    Objective:  BP 128/70   Pulse 73   Temp 98.8 F (37.1 C)   Ht 5' 4"  (1.626 m)   Wt 204 lb (92.5 kg)   SpO2 93%   BMI 35.02 kg/m   BP Readings from Last 3 Encounters:  05/30/22 128/70  01/04/22 130/70  12/19/21 125/70    Wt Readings from Last 3 Encounters:  05/30/22 204 lb (92.5 kg)  01/04/22 205 lb (93 kg)  12/19/21 199 lb (90.3 kg)     Physical Exam Constitutional:      General: She is not in acute distress.    Appearance: She is well-developed.  Cardiovascular:     Rate and Rhythm: Normal rate and regular rhythm.  Pulmonary:     Breath sounds: Normal breath sounds.  Musculoskeletal:        General: Normal range of motion.  Skin:    General: Skin is warm and dry.  Neurological:     Mental Status: She is alert and oriented to person, place, and time.       Assessment & Plan:   Mafalda was seen today for medical management of chronic issues.  Diagnoses and all orders for this visit:  Diabetes mellitus without complication ([place]) -     Bayer DCA Hb A1c Waived -     Microalbumin / creatinine urine ratio  Iron deficiency anemia due to chronic blood loss -     CBC with Differential/Platelet -     Lipid panel -     CMP14+EGFR -     Thyroid Panel With TSH  Neoplasm of uncertain behavior of skin -     Ambulatory referral to Dermatology  Other orders -     glucose blood (ONETOUCH VERIO) test strip; Test BS twice daily Dx e11.9 -     Lancets (ONETOUCH DELICA PLUS [id number]) MISC; Test BS twice daily Dx e11.9 -     fexofenadine-pseudoephedrine (ALLEGRA-D 24) 180-240 MG 24 hr tablet; Take 1 tablet by mouth every evening. For allergy and congestion -     diclofenac Sodium  (VOLTAREN) 1 % GEL; Apply 4 g topically 4 (four) times daily. To each knee      I have discontinued Mardene Celeste B. Rao's loperamide and benzonatate. I am also having her start on fexofenadine-pseudoephedrine and diclofenac Sodium. Additionally, I am having her maintain her acetaminophen, OneTouch Verio Reflect, furosemide, ipratropium-albuterol, albuterol, fluticasone, metFORMIN, OneTouch Verio, and OneTouch Delica Plus [id number].  Meds ordered this encounter  Medications   glucose blood (ONETOUCH VERIO) test strip    Sig: Test BS twice daily Dx e11.9    Dispense:  200 strip    Refill:  3    Requesting 1 year supply   Lancets (ONETOUCH DELICA PLUS [id number]) MISC    Sig: Test BS twice daily Dx e11.9    Dispense:  200 each    Refill:  3    Requesting 1 year supply   fexofenadine-pseudoephedrine (ALLEGRA-D 24) 180-240 MG 24 hr tablet    Sig: Take 1 tablet by mouth every evening. For allergy and congestion    Dispense:  30 tablet    Refill:  11   diclofenac Sodium (VOLTAREN) 1 % GEL    Sig: Apply 4 g topically 4 (four) times daily. To each knee  Dispense:  1050 g    Refill:  5     Follow-up: Return in about 6 months (around 11/29/2022).  Claretta Fraise, M.D.

## 2022-05-31 ENCOUNTER — Telehealth: Payer: Self-pay | Admitting: Family Medicine

## 2022-05-31 LAB — MICROALBUMIN / CREATININE URINE RATIO
Creatinine, Urine: 176.2 mg/dL
Microalb/Creat Ratio: 11 mg/g creat (ref 0–29)
Microalbumin, Urine: 18.7 ug/mL

## 2022-05-31 LAB — CMP14+EGFR
ALT: 27 IU/L (ref 0–32)
AST: 34 IU/L (ref 0–40)
Albumin/Globulin Ratio: 1.7 (ref 1.2–2.2)
Albumin: 3.9 g/dL (ref 3.9–4.9)
Alkaline Phosphatase: 85 IU/L (ref 44–121)
BUN/Creatinine Ratio: 15 (ref 12–28)
BUN: 9 mg/dL (ref 8–27)
Bilirubin Total: 3.4 mg/dL — ABNORMAL HIGH (ref 0.0–1.2)
CO2: 22 mmol/L (ref 20–29)
Calcium: 9 mg/dL (ref 8.7–10.3)
Chloride: 107 mmol/L — ABNORMAL HIGH (ref 96–106)
Creatinine, Ser: 0.59 mg/dL (ref 0.57–1.00)
Globulin, Total: 2.3 g/dL (ref 1.5–4.5)
Glucose: 96 mg/dL (ref 70–99)
Potassium: 3.6 mmol/L (ref 3.5–5.2)
Sodium: 144 mmol/L (ref 134–144)
Total Protein: 6.2 g/dL (ref 6.0–8.5)
eGFR: 102 mL/min/{1.73_m2} (ref >59)

## 2022-05-31 LAB — CBC WITH DIFFERENTIAL/PLATELET
Basophils Absolute: 0 10*3/uL (ref 0.0–0.2)
Basos: 1 %
EOS (ABSOLUTE): 0 10*3/uL (ref 0.0–0.4)
Eos: 1 %
Hematocrit: 39.3 % (ref 34.0–46.6)
Hemoglobin: 13.9 g/dL (ref 11.1–15.9)
Immature Grans (Abs): 0 10*3/uL (ref 0.0–0.1)
Immature Granulocytes: 0 %
Lymphocytes Absolute: 1 10*3/uL (ref 0.7–3.1)
Lymphs: 26 %
MCH: 29.6 pg (ref 26.6–33.0)
MCHC: 35.4 g/dL (ref 31.5–35.7)
MCV: 84 fL (ref 79–97)
Monocytes Absolute: 0.2 10*3/uL (ref 0.1–0.9)
Monocytes: 6 %
Neutrophils Absolute: 2.7 10*3/uL (ref 1.4–7.0)
Neutrophils: 66 %
Platelets: 78 10*3/uL — CL (ref 150–450)
RBC: 4.7 x10E6/uL (ref 3.77–5.28)
RDW: 14.3 % (ref 11.7–15.4)
WBC: 4 10*3/uL (ref 3.4–10.8)

## 2022-05-31 LAB — THYROID PANEL WITH TSH
Free Thyroxine Index: 2.3 (ref 1.2–4.9)
T3 Uptake Ratio: 30 % (ref 24–39)
T4, Total: 7.8 ug/dL (ref 4.5–12.0)
TSH: 0.746 u[IU]/mL (ref 0.450–4.500)

## 2022-05-31 LAB — LIPID PANEL
Chol/HDL Ratio: 2.3 ratio (ref 0.0–4.4)
Cholesterol, Total: 133 mg/dL (ref 100–199)
HDL: 58 mg/dL (ref >39)
LDL Chol Calc (NIH): 58 mg/dL (ref 0–99)
Triglycerides: 90 mg/dL (ref 0–149)
VLDL Cholesterol Cal: 17 mg/dL (ref 5–40)

## 2022-05-31 NOTE — Telephone Encounter (Signed)
Pt called stating that 2 of her medicines that Dr Livia Snellen sent in for her are not covered by insurance because they are cosidered OTC and also states that the antibiotic that Dr Livia Snellen was going to send in for pts ear was not sent in.  Please advise and call patient.

## 2022-05-31 NOTE — Progress Notes (Signed)
Hello Kimberly Obrien,  Your lab result is normal and/or stable.Some minor variations that are not significant are commonly marked abnormal, but do not represent any medical problem for you.  Best regards, Claretta Fraise, M.D.

## 2022-06-02 ENCOUNTER — Other Ambulatory Visit: Payer: Self-pay | Admitting: Family Medicine

## 2022-06-02 MED ORDER — AMOXICILLIN-POT CLAVULANATE 875-125 MG PO TABS: 1.0000 | ORAL_TABLET | Freq: Two times a day (BID) | ORAL | 0 refills | Status: DC

## 2022-06-02 NOTE — Telephone Encounter (Signed)
My apology for the delay. I sent the prescription for the antibiotic in. The over the counter meds do not have a prescription version, so She may want to just buy a small supply of the store brand.

## 2022-06-02 NOTE — Telephone Encounter (Signed)
She needs a visit because there is no note completed so I have no idea what the plan was.  Maybe offer her DOD phone?

## 2022-06-02 NOTE — Telephone Encounter (Signed)
Pt called again wanting to know if Dr Livia Snellen was going to send her antibiotic in because she hasnt heard anything. Explained to pt that we sent Dr Livia Snellen the message but he did not respond to it and is out of the office on Fridays. Pt wants to know if anyone else can send antibiotic in for her before the weekend?  Please advise and call patient.  Pt says Dr Livia Snellen was also supposed to send in refills for her Fluocinonide 0.05% Cream but didn't.

## 2022-06-02 NOTE — Telephone Encounter (Signed)
PT AWARE  

## 2022-06-03 ENCOUNTER — Encounter: Payer: Self-pay | Admitting: Family Medicine

## 2022-06-06 ENCOUNTER — Ambulatory Visit: Payer: Medicare Other | Admitting: Gastroenterology

## 2022-06-20 ENCOUNTER — Ambulatory Visit (HOSPITAL_COMMUNITY)
Admission: RE | Admit: 2022-06-20 | Discharge: 2022-06-20 | Disposition: A | Discharge status: Home / Self Care | Payer: Medicare Other | Source: Ambulatory Visit | Attending: Gastroenterology | Admitting: Gastroenterology

## 2022-06-20 DIAGNOSIS — K7460 Unspecified cirrhosis of liver: Secondary | ICD-10-CM | POA: Diagnosis not present

## 2022-06-30 ENCOUNTER — Encounter: Payer: Self-pay | Admitting: Gastroenterology

## 2022-06-30 ENCOUNTER — Ambulatory Visit: Payer: Medicare Other | Admitting: Gastroenterology

## 2022-06-30 ENCOUNTER — Other Ambulatory Visit (INDEPENDENT_AMBULATORY_CARE_PROVIDER_SITE_OTHER): Payer: Medicare Other

## 2022-06-30 VITALS — BP 136/78 | HR 70 | Ht 64.0 in | Wt 202.0 lb

## 2022-06-30 DIAGNOSIS — K529 Noninfective gastroenteritis and colitis, unspecified: Secondary | ICD-10-CM

## 2022-06-30 DIAGNOSIS — K7469 Other cirrhosis of liver: Secondary | ICD-10-CM | POA: Diagnosis not present

## 2022-06-30 DIAGNOSIS — I8500 Esophageal varices without bleeding: Secondary | ICD-10-CM

## 2022-06-30 LAB — PROTIME-INR
INR: 1.5 ratio — ABNORMAL HIGH (ref 0.8–1.0)
Prothrombin Time: 15.7 s — ABNORMAL HIGH (ref 9.6–13.1)

## 2022-06-30 MED ORDER — DICYCLOMINE HCL 10 MG PO CAPS: 10.0000 mg | ORAL_CAPSULE | Freq: Three times a day (TID) | ORAL | 1 refills | Status: DC | PRN

## 2022-06-30 NOTE — Progress Notes (Signed)
HPI :  61 year old female here for a follow up visit for NASH cirrhosis, chronic loose stools.   Cirrhosis history:  Previously followed at [place], [place] hepatology clinic (Dr. Lurlean Leyden), and [facility] with cirrhosis several years ago. She had a positive ANA and mildly positive smooth muscle antibody and it sounds like she was started empirically on steroids at one point in time as well as budesonide.  Her liver enzymes were fluctuating.  She was seen by [place] / Dr. Precious Gilding office in 2018 who reviewed her history and recommended a liver biopsy.  At that point time biopsy showed grade 2 of 3 steatohepatitis along with cirrhosis.  Biopsy was most suggestive of Karlene Lineman cirrhosis and not autoimmune hepatitis.  Her steroids were stopped at that time.  Her mother died of cirrhosis thought to be due to fatty liver disease.  The patient emphatically denies any significant alcohol use historically.    Recall she has also had a history of iron deficiency anemia and GI bleeding in the past.  In 2018 with [facility] she had EGD and colonoscopy which did not yield cause for bleeding, reportedly had a questionable vascular lesion on a capsule endoscopy at that time.  She does not tolerate oral iron very well, and had a reaction to IV iron in the past.   She is here for a follow-up visit today.  She has generally been doing pretty well since have last seen her.  She has a new grandchild and provides a lot of childcare and is quite busy.  She has remained compensated since have last seen her.  She does not endorse any jaundice, no encephalopathy, no ascites.  She is prescribed Lasix as needed but states she does not really use it, no significant lower extremity edema.  She had an EGD this past January showed stable small varices in her esophagus.  She had a right upper quadrant ultrasound at the end of October which showed stable changes of cirrhosis.  She has had labs in October as outlined below.  Recall she  has chronic loose stools often after bowel movement.  Is been ongoing for some time.  She has some associated abdominal cramping with this and bloating.  Recall she has a history of postcholecystectomy state, prior colonoscopies have not shown any inflammatory changes, biopsies negative for microscopic colitis.  We have tried her on Colestid in the past and she states she did not tolerate it very well.  She is tested negative for celiac in the past.  She previously had been using Imodium as needed.  She states Imodium can cause bloating, and while it helps treat the loose stools, she really does not like taking it.  She does take metformin for diabetes she does not think that is really related to her symptoms.  She inquires about other options.  She does not use any NSAIDs.   Not immune to hep A / B 05/2019 - she declined vaccination Alpha one, AMA nehgative, iron studies okay, celiac serology negative      Liver biopsy 03/15/18 -  Liver, needle/core biopsy, transjug - MILD TO MODERATELY ACTIVE STEATOHEPATITIS (GRADE 1-2 OF 3; IF DETERMINED TO BE NAFLD, THEN THE NAS = 3 OF 8). - CIRRHOSIS (STAGE 4 OF 4) The biopsy is adequate for review and consists of several cores of liver with a distorted architecture. Trichrome stain highlights the presence of septal fibrosis as well as a pericellular pattern of fibrosis. Hepatic nodules show mild  macrovesicular steatosis (approximately 20%) with mild to moderate ballooning degeneration and few poorly formed Mallory hyalins. Hepatic nodules do not show any significant inflammation. Definitive megamitochondria are not seen. The portal tracts and fibrous septa mild to moderate, mostly lymphocytic inflammation without significant interface hepatitis. Mild ductular reaction is present with associated nonspecific neutrophilic response. Iron stain shows moderate granular iron accumulation within Kupffer cells, consistent with secondary iron overload. PASD stain shows no  globular inclusions in hepatocytes. The findings are consistent with mold to moderately acive steatohepatitis, alcoholic and/or non-alcoholic, with cirrhosis.     EGD 07/29/20 -  - A 1 cm hiatal hernia was present. - Small (< 5 mm) varices were found in the lower third of the esophagus without any high risk stigmata for bleeding - The exam of the esophagus was otherwise normal. - Moderate suspected portal hypertensive gastropathy was found in the entire examined stomach - snakeskin appearance with erythema and adherent heme. Biopsies were taken with a cold forceps for Helicobacter pylori testing. - A single 4-5 mm sessile polyp was found in the gastric fundus with a yellowish hue. Biopsies were taken with a cold forceps for histology. - The exam of the stomach was otherwise normal. No gastric varices - The duodenal bulb and second portion of the duodenum were normal.       Colonoscopy 07/29/20 - One 5 mm polyp in the ascending colon, removed with a cold snare. Resected and retrieved. - Two 5 mm polyps at the hepatic flexure, removed with a cold snare. Resected and retrieved. - Five 4 to 8 mm polyps in the transverse colon, removed with a cold snare. Resected and retrieved. - Two 4 to 5 mm polyps in the descending colon, removed with a cold snare. Resected and retrieved. - Four 3 to 4 mm polyps in the sigmoid colon, removed with a cold snare. Resected and retrieved. - Two 3 to 4 mm polyps in the rectum, removed with a cold snare. Resected and retrieved. - A few recently bleeding colonic angiodysplastic lesions. - Internal hemorrhoids. - The examination was otherwise normal.   1. Surgical [P], stomach, gastric - MILD CHRONIC GASTRITIS. MILD REACTIVE GASTROPATHY. - WARTHIN-STARRY STAIN IS NEGATIVE FOR HELICOBACTER PYLORI. 2. Surgical [P], stomach, gastric polyp (1) - HYPERPLASTIC POLYP. - NEGATIVE FOR GOBLET CELL METAPLASIA. 3. Surgical [P], colon, sigmoid, hepatic flexure, ascending,  transverse, descending, rectal, polyps (16) - TUBULAR ADENOMA (X MULTIPLE). - NEGATIVE FOR HIGH GRADE DYSPLASIA.     Abdominal wall trigger point injection 01/11/21     Tested negative for C diff 07/11/21     EGD 09/06/21: - A 1 cm hiatal hernia was present. - Small varices were found in the middle third of the esophagus and in the lower third of the esophagus, unchanged from the prior exam. No high risk stigmata for bleeding. - The exam of the esophagus was otherwise normal. - Portal hypertensive gastropathy was found in the entire examined stomach (prior biopsies on last exam ruled out H pylori - no further biopsies taken today) - The exam of the stomach was otherwise normal. No gastric varices. - A single diminutive angiodysplastic lesion without bleeding was found in the second portion of the duodenum. - The exam of the duodenum was otherwise normal.     Colonoscopy 09/06/21: - The perianal and digital rectal examinations were normal. - A few small angiodysplastic lesions were found in the rectum, in the sigmoid colon, in the transverse colon and in the ascending colon. - Internal hemorrhoids  were found during retroflexion. - The exam was otherwise without abnormality. - Biopsies for histology were taken with a cold forceps from the right colon, left colon and transverse colon for evaluation of microscopic colitis given patient's incidental complaint of persistent loose stools.     Surgical [P], random colon - diarrhea BENIGN COLONIC MUCOSA WITH NO DIAGNOSTIC ABNORMALITY    Echocardiogram 01/25/22: EF 65-70%,    RUQ [place] 06/20/22: IMPRESSION: Morphologic changes compatible with cirrhosis. No focal lesion.     Past Medical History:  Diagnosis Date   Anemia    Anticoagulation monitoring by pharmacist    Arthritis    Asthma    Blood transfusion without reported diagnosis    Cataract    removed both eyes   Cirrhosis ([place])    COPD (chronic obstructive pulmonary  disease) ([place])    Diabetes mellitus without complication (HCC)    Dyspnea    exertion   Elevated hemoglobin A1c 03/01/2018   Family history of Hodgkin's lymphoma    Family history of leukemia    Family history of lung cancer    Family history of stomach cancer    Family history of uterine cancer    GERD (gastroesophageal reflux disease)    mild- diet related   Heart murmur    History of colon polyps    History of pulmonary embolism 02/04/2013   Hyperlipidemia    controlled   NASH (nonalcoholic steatohepatitis)    Neuromuscular disorder (HCC)    back surgery   PE (pulmonary embolism)    Routine Papanicolaou smear 09/12/2021   Sleep apnea    no cpap   Ureteral calculus, left 07/28/2021   UTI symptoms 08/18/2021     Past Surgical History:  Procedure Laterality Date   ABDOMINAL HYSTERECTOMY  2009   BREAST BIOPSY Left    CATARACT EXTRACTION, BILATERAL Bilateral    CHOLECYSTECTOMY     COLONOSCOPY WITH PROPOFOL N/A 05/07/2017   Procedure: COLONOSCOPY WITH PROPOFOL;  Surgeon: Laurence Spates, MD;  Location: [facility];  Service: Endoscopy;  Laterality: N/A;   ESOPHAGOGASTRODUODENOSCOPY (EGD) WITH PROPOFOL N/A 05/07/2017   Procedure: ESOPHAGOGASTRODUODENOSCOPY (EGD) WITH PROPOFOL;  Surgeon: Laurence Spates, MD;  Location: [facility];  Service: Endoscopy;  Laterality: N/A;   INCONTINENCE SURGERY  2009   with hysterectomy   IR TRANSCATHETER BX  03/15/2018   IR VENOGRAM HEPATIC W HEMODYNAMIC EVALUATION  03/15/2018   LEFT AND RIGHT HEART CATHETERIZATION WITH CORONARY ANGIOGRAM N/A 05/13/2014   Procedure: LEFT AND RIGHT HEART CATHETERIZATION WITH CORONARY ANGIOGRAM;  Surgeon: [name], MD;  Location: [facility] CATH LAB;  Service: Cardiovascular;  Laterality: N/A;   SPINE SURGERY     L5 S1 fusion x4   TUBAL LIGATION     WRIST SURGERY Right    Had surgery twice to shave bone for blood circulation improvement.   Family History  Problem Relation Age of Onset   Cirrhosis  Mother        NASH   Irritable bowel syndrome Mother    Hyperlipidemia Father    Heart failure Father    Bipolar disorder Father    Heart disease Father    Obesity Daughter    Endometrial cancer Daughter 85   Hypertension Brother    Colon polyps Sister        unknown number but fewer than patient   Irritable bowel syndrome Sister    Heart failure Paternal Grandfather    Stomach cancer Maternal Uncle        dx  40s/50s   Cancer Paternal Aunt        NOS   Cancer Paternal Uncle        NOS   Lung cancer Maternal Uncle        dx 40s/50s, smoker   Cancer Maternal Uncle        NOS   Cancer Paternal Aunt        NOS   Leukemia Cousin        dx 10s, died 2 weeks after diagnosis (maternal first cousin)   Cancer Cousin        NOS (paternal first cousin)   Hodgkin's lymphoma Other 57       4th degree relative (mother's half-sibling's son)   Heart attack Neg Hx    Miscarriages / Stillbirths Neg Hx    Colon cancer Neg Hx    Esophageal cancer Neg Hx    Rectal cancer Neg Hx    Social History   Tobacco Use   Smoking status: Former    Packs/day: 2.00    Years: 50.00    Total pack years: 100.00    Types: Cigarettes    Start date: 08/22/1977    Quit date: 04/21/2012    Years since quitting: 10.1   Smokeless tobacco: Never  Vaping Use   Vaping Use: Never used  Substance Use Topics   Alcohol use: No   Drug use: No   Current Outpatient Medications  Medication Sig Dispense Refill   metFORMIN (GLUCOPHAGE-XR) 750 MG 24 hr tablet Take 1 tablet (750 mg total) by mouth daily with breakfast. 90 tablet 3   acetaminophen (TYLENOL) 500 MG tablet Take 500 mg by mouth every 6 (six) hours as needed. (Patient not taking: Reported on 06/30/2022)     albuterol (VENTOLIN HFA) 108 (90 Base) MCG/ACT inhaler Inhale 2 puffs into the lungs every 6 (six) hours as needed for wheezing or shortness of breath. (Patient not taking: Reported on 06/30/2022) 18 g 0   amoxicillin-clavulanate (AUGMENTIN) 875-125 MG  tablet Take 1 tablet by mouth 2 (two) times daily. Take all of this medication (Patient not taking: Reported on 06/30/2022) 20 tablet 0   Blood Glucose Monitoring Suppl (ONETOUCH VERIO REFLECT) w/Device KIT Test BS twice daily Dx e11.9 (Patient not taking: Reported on 06/30/2022) 1 kit 0   diclofenac Sodium (VOLTAREN) 1 % GEL Apply 4 g topically 4 (four) times daily. To each knee (Patient not taking: Reported on 06/30/2022) 1050 g 5   fexofenadine-pseudoephedrine (ALLEGRA-D 24) 180-240 MG 24 hr tablet Take 1 tablet by mouth every evening. For allergy and congestion (Patient not taking: Reported on 06/30/2022) 30 tablet 11   fluticasone (FLONASE) 50 MCG/ACT nasal spray Place 2 sprays into both nostrils daily. (Patient not taking: Reported on 06/30/2022) 16 g 11   furosemide (LASIX) 20 MG tablet Take 1 tablet (20 mg total) by mouth as needed. (Patient not taking: Reported on 06/30/2022) 30 tablet 3   glucose blood (ONETOUCH VERIO) test strip Test BS twice daily Dx e11.9 (Patient not taking: Reported on 06/30/2022) 200 strip 3   ipratropium-albuterol (DUONEB) 0.5-2.5 (3) MG/3ML SOLN Take 3 mLs by nebulization every 6 (six) hours as needed. (Patient not taking: Reported on 06/30/2022) 360 mL 1   Lancets (ONETOUCH DELICA PLUS [id number]) MISC Test BS twice daily Dx e11.9 (Patient not taking: Reported on 06/30/2022) 200 each 3   No current facility-administered medications for this visit.   Allergies  Allergen Reactions   Aciphex [Rabeprazole Sodium] Rash   Fluconazole  Rash and Hives   Lansoprazole Hives   Septra [Sulfamethoxazole-Trimethoprim] Nausea And Vomiting    "made me bleed"   Sucralfate Nausea Only   Macrobid [Nitrofurantoin] Diarrhea   Vancomycin Diarrhea   Claritin [Loratadine] Other (See Comments)    Tired    Crestor [Rosuvastatin] Other (See Comments)    Elevated blood glucose and abdominal pain   Doxycycline Rash   Ibuprofen Other (See Comments)    Upset stomach   Lipitor  [Atorvastatin] Other (See Comments)    Myalgias, leg pain.  Problems with pancreas, sugars went up also.   Metoprolol Nausea And Vomiting   Metronidazole Other (See Comments) and Nausea Only    Dizziness, nausea, dry mouth and some shortness of breath   Nsaids Other (See Comments)    Upset stomach   Omeprazole     bloating   Rabeprazole Rash     Review of Systems: All systems reviewed and negative except where noted in HPI.    US Abdomen Limited RUQ (LIVER/GB)  Result Date: 06/20/2022 CLINICAL DATA:  Cirrhosis.  Screening for [place]. EXAM: ULTRASOUND ABDOMEN LIMITED RIGHT UPPER QUADRANT COMPARISON:  Ultrasound abdomen March 28 23 FINDINGS: Gallbladder: Surgically absent Common bile duct: Diameter: 6.6 mm Liver: Coarsened echogenicity and nodular contour. No focal lesion. Portal vein is patent on color Doppler imaging with normal direction of blood flow towards the liver. Other: None. IMPRESSION: Morphologic changes compatible with cirrhosis. No focal lesion. Electronically Signed   By: Lovey Newcomer M.D.   On: 06/20/2022 15:33    Lab Results  Component Value Date   WBC 4.0 05/30/2022   HGB 13.9 05/30/2022   HCT 39.3 05/30/2022   MCV 84 05/30/2022   PLT 78 (LL) 05/30/2022    Lab Results  Component Value Date   CREATININE 0.59 05/30/2022   BUN 9 05/30/2022   NA 144 05/30/2022   K 3.6 05/30/2022   CL 107 (H) 05/30/2022   CO2 22 05/30/2022    Lab Results  Component Value Date   ALT 27 05/30/2022   AST 34 05/30/2022   ALKPHOS 85 05/30/2022   BILITOT 3.4 (H) 05/30/2022    Lab Results  Component Value Date   INR 1.5 (H) 06/30/2022   INR 1.3 (H) 09/13/2021   INR 1.3 (H) 12/29/2020    Physical Exam: BP 136/78   Pulse 70   Ht _0  (1.626 m)   Wt 202 lb (91.6 kg)   BMI 34.67 kg/m  Constitutional: Pleasant,well-developed, female in no acute distress. Neurological: Alert and oriented to person place and time. Skin: Skin is warm and dry. No rashes noted. Psychiatric:  Normal mood and affect. Behavior is normal.   ASSESSMENT: 61 y.o. female here for assessment of the following  1. Other cirrhosis of liver ([place])   2. Esophageal varices without bleeding, unspecified esophageal varices type ([place])   3. Chronic diarrhea    NASH cirrhosis, compensated.  Right upper quadrant ultrasound shows stable changes.  We have discussed her cirrhosis in general, risks for decompensation over time and risks for [facility].  She does have chronic thrombocytopenia, bili has slightly risen, we will recheck an INR today as well as to check her AFP.  She is due for right upper quadrant ultrasound again in April.  She is due for another EGD for surveillance of small varices in January, we will schedule her for that.  I discussed risk benefits of the procedure and anesthesia and she wants to proceed.  Otherwise discussed her chronic  bowel dysfunction.  She really does not like the way Colestid and Imodium makes her feel but continues to have postprandial loose stools and cramping.  She does not want to use medications routinely, I will give her some Bentyl to use as needed for worsening symptoms of cramping and see if that helps.  Could also consider a course of rifaximin pending her course, although I am concerned about cost of this for her.  We will see how she does with some Bentyl first.  PLAN: - schedule EGD in January in the [facility] - labs for AFP and INR today - counseled on cirrhosis - risks of decompensation and HCC. Repeat RUQ [place] in April  - bentyl 42m every 8 hours PRN #30 RF1 - for bloating / abdominal cramps / loose stools - immodium PRN - consideration for Rifaximin if bentyl does not help, although concerned about cost - follow up in 6 months  SJolly Mango MD [facility]

## 2022-06-30 NOTE — Patient Instructions (Addendum)
_______________________________________________________  If you are age 61 or older, your body mass index should be between 23-30. Your Body mass index is 34.67 kg/m. If this is out of the aforementioned range listed, please consider follow up with your Primary Care Provider.  If you are age 34 or younger, your body mass index should be between 19-25. Your Body mass index is 34.67 kg/m. If this is out of the aformentioned range listed, please consider follow up with your Primary Care Provider.   ________________________________________________________  The [facility] providers would like to encourage you to use [facility] to communicate with providers for non-urgent requests or questions.  Due to long hold times on the telephone, sending your provider a message by [facility] may be a faster and more efficient way to get a response.  Please allow 48 business hours for a response.  Please remember that this is for non-urgent requests.  _______________________________________________________  Kimberly Obrien have been scheduled for an endoscopy. Please follow written instructions given to you at your visit today. If you use inhalers (even only as needed), please bring them with you on the day of your procedure.  Please go to the lab in the basement of our building to have lab work done as you leave today. Hit "B" for basement when you get on the elevator.  When the doors open the lab is on your left.  We will call you with the results. Thank you.  You will be due for an ultrasound of your liver in April of 2024.  We will remind you when it is time to schedule.  We have sent the following medications to your pharmacy for you to pick up at your convenience: Bentyl 10 mg: Take every 8 hours as needed  Please follow up in 6 months  Thank you for entrusting me with your care and for choosing [employer], Dr. Ellenton Cellar

## 2022-07-03 LAB — AFP TUMOR MARKER: AFP-Tumor Marker: 3.1 ng/mL

## 2022-08-22 ENCOUNTER — Other Ambulatory Visit: Payer: Self-pay | Admitting: Family Medicine

## 2022-08-22 DIAGNOSIS — Z1231 Encounter for screening mammogram for malignant neoplasm of breast: Secondary | ICD-10-CM

## 2022-08-24 NOTE — Progress Notes (Signed)
Attempted to reach patient regarding follow-up LDCT. Unable to reach patient at this time, detailed VM left asking that the patient return my call. °

## 2022-08-28 ENCOUNTER — Other Ambulatory Visit: Payer: Self-pay

## 2022-08-28 ENCOUNTER — Ambulatory Visit
Admission: RE | Admit: 2022-08-28 | Discharge: 2022-08-28 | Disposition: A | Discharge status: Home / Self Care | Payer: Medicare Other | Source: Ambulatory Visit | Attending: Family Medicine | Admitting: Family Medicine

## 2022-08-28 DIAGNOSIS — Z1231 Encounter for screening mammogram for malignant neoplasm of breast: Secondary | ICD-10-CM

## 2022-08-28 DIAGNOSIS — Z87891 Personal history of nicotine dependence: Secondary | ICD-10-CM

## 2022-08-28 DIAGNOSIS — Z122 Encounter for screening for malignant neoplasm of respiratory organs: Secondary | ICD-10-CM

## 2022-08-28 NOTE — Progress Notes (Signed)
Order placed for LDCT per protocol. Scan scheduled for 01/30 at 6p

## 2022-08-29 ENCOUNTER — Encounter: Payer: Self-pay | Admitting: Certified Registered Nurse Anesthetist

## 2022-09-01 ENCOUNTER — Encounter: Payer: Medicare Other | Admitting: Gastroenterology

## 2022-09-19 ENCOUNTER — Ambulatory Visit (HOSPITAL_COMMUNITY)
Admission: RE | Admit: 2022-09-19 | Discharge: 2022-09-19 | Disposition: A | Discharge status: Home / Self Care | Payer: Medicare Other | Source: Ambulatory Visit | Attending: Physician Assistant | Admitting: Physician Assistant

## 2022-09-19 ENCOUNTER — Other Ambulatory Visit: Payer: Self-pay | Admitting: Physician Assistant

## 2022-09-19 DIAGNOSIS — Z87891 Personal history of nicotine dependence: Secondary | ICD-10-CM | POA: Diagnosis not present

## 2022-09-19 DIAGNOSIS — Z122 Encounter for screening for malignant neoplasm of respiratory organs: Secondary | ICD-10-CM | POA: Diagnosis not present

## 2022-09-19 DIAGNOSIS — F17210 Nicotine dependence, cigarettes, uncomplicated: Secondary | ICD-10-CM | POA: Diagnosis not present

## 2022-09-21 NOTE — Progress Notes (Signed)
Patient notified of LDCT Lung Cancer Screening Results via mail with the recommendation to follow-up in 12 months. Patient's referring provider has been sent a copy of results. Results are as follows:   IMPRESSION: 1. Lung-RADS 2, benign appearance or behavior. Continue annual screening with low-dose chest CT without contrast in 12 months. 2. Cirrhotic liver morphology with splenomegaly. 3. Coronary artery calcifications, aortic Atherosclerosis (ICD10-I70.0) and Emphysema (ICD10-J43.9).

## 2022-10-01 ENCOUNTER — Encounter: Payer: Self-pay | Admitting: Certified Registered Nurse Anesthetist

## 2022-10-02 ENCOUNTER — Telehealth (INDEPENDENT_AMBULATORY_CARE_PROVIDER_SITE_OTHER): Payer: Medicare Other | Admitting: Nurse Practitioner

## 2022-10-02 ENCOUNTER — Encounter: Payer: Self-pay | Admitting: Nurse Practitioner

## 2022-10-02 DIAGNOSIS — J069 Acute upper respiratory infection, unspecified: Secondary | ICD-10-CM

## 2022-10-02 MED ORDER — AMOXICILLIN 875 MG PO TABS: 875.0000 mg | ORAL_TABLET | Freq: Two times a day (BID) | ORAL | 0 refills | Status: DC

## 2022-10-02 MED ORDER — PREDNISONE 20 MG PO TABS: 40.0000 mg | ORAL_TABLET | Freq: Every day | ORAL | 0 refills | Status: AC

## 2022-10-02 NOTE — Progress Notes (Signed)
Virtual Visit Consent   Kimberly Obrien, you are scheduled for a virtual visit with Mary-Margaret Hassell Done, [place], a [facility] provider, today.     Just as with appointments in the office, your consent must be obtained to participate.  Your consent will be active for this visit and any virtual visit you may have with one of our providers in the next 365 days.     If you have a MyChart account, a copy of this consent can be sent to you electronically.  All virtual visits are billed to your insurance company just like a traditional visit in the office.    As this is a virtual visit, video technology does not allow for your provider to perform a traditional examination.  This may limit your provider's ability to fully assess your condition.  If your provider identifies any concerns that need to be evaluated in person or the need to arrange testing (such as labs, EKG, etc.), we will make arrangements to do so.     Although advances in technology are sophisticated, we cannot ensure that it will always work on either your end or our end.  If the connection with a video visit is poor, the visit may have to be switched to a telephone visit.  With either a video or telephone visit, we are not always able to ensure that we have a secure connection.     I need to obtain your verbal consent now.   Are you willing to proceed with your visit today? YES   Kimberly Obrien has provided verbal consent on 10/02/2022 for a virtual visit (video or telephone).   Mary-Margaret Hassell Done, FNP   Date: 10/02/2022 10:40 AM   Virtual Visit via Video Note   I, Mary-Margaret Hassell Done, connected with Kimberly Obrien ([id number], [birth year]) on 10/02/22 at  5:30 PM EST by a video-enabled telemedicine application and verified that I am speaking with the correct person using two identifiers.  Location: Patient: Virtual Visit Location Patient: Home Provider: Virtual Visit Location Provider: Mobile   I discussed the  limitations of evaluation and management by telemedicine and the availability of in person appointments. The patient expressed understanding and agreed to proceed.    History of Present Illness: Kimberly Obrien is a 62 y.o. who identifies as a female who was assigned female at birth, and is being seen today for uri .  HPI: URI  This is a new problem. The current episode started in the past 7 days. The problem has been gradually worsening. There has been no fever. Associated symptoms include congestion, coughing, rhinorrhea and a sore throat. She has tried acetaminophen (flonase) for the symptoms. The treatment provided mild relief.    Review of Systems  HENT:  Positive for congestion, rhinorrhea and sore throat.   Respiratory:  Positive for cough.     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   OSA (obstructive sleep apnea) 05/22/2019   Iron deficiency anemia due to chronic blood loss 10/10/2017   Enterocolitis 10/17/2016   COPD (chronic obstructive pulmonary disease) ([place]) 09/23/2015   Autoimmune hepatitis ([place]) 09/23/2015   Irritable bowel syndrome (IBS) 07/19/2015   Tachycardia 12/04/2013   Vitamin D deficiency 02/04/2013  Chronic arthralgias of knees and hips 02/04/2013   Insomnia 02/04/2013   GAD (generalized anxiety disorder) 02/04/2013   HLD (hyperlipidemia) 02/04/2013    Allergies:  Allergies  Allergen Reactions   Aciphex [Rabeprazole Sodium] Rash   Fluconazole Rash and Hives   Lansoprazole Hives   Septra [Sulfamethoxazole-Trimethoprim] Nausea And Vomiting    "made me bleed"   Sucralfate Nausea Only   Macrobid [Nitrofurantoin] Diarrhea   Vancomycin Diarrhea   Claritin [Loratadine] Other (See Comments)     Tired    Crestor [Rosuvastatin] Other (See Comments)    Elevated blood glucose and abdominal pain   Doxycycline Rash   Ibuprofen Other (See Comments)    Upset stomach   Lipitor [Atorvastatin] Other (See Comments)    Myalgias, leg pain.  Problems with pancreas, sugars went up also.   Metoprolol Nausea And Vomiting   Metronidazole Other (See Comments) and Nausea Only    Dizziness, nausea, dry mouth and some shortness of breath   Nsaids Other (See Comments)    Upset stomach   Omeprazole     bloating   Rabeprazole Rash   Medications:  Current Outpatient Medications:    acetaminophen (TYLENOL) 500 MG tablet, Take 500 mg by mouth every 6 (six) hours as needed. (Patient not taking: Reported on 06/30/2022), Disp: , Rfl:    albuterol (VENTOLIN HFA) 108 (90 Base) MCG/ACT inhaler, Inhale 2 puffs into the lungs every 6 (six) hours as needed for wheezing or shortness of breath. (Patient not taking: Reported on 06/30/2022), Disp: 18 g, Rfl: 0   amoxicillin-clavulanate (AUGMENTIN) 875-125 MG tablet, Take 1 tablet by mouth 2 (two) times daily. Take all of this medication (Patient not taking: Reported on 06/30/2022), Disp: 20 tablet, Rfl: 0   Blood Glucose Monitoring Suppl (ONETOUCH VERIO REFLECT) w/Device KIT, Test BS twice daily Dx e11.9 (Patient not taking: Reported on 06/30/2022), Disp: 1 kit, Rfl: 0   diclofenac Sodium (VOLTAREN) 1 % GEL, Apply 4 g topically 4 (four) times daily. To each knee (Patient not taking: Reported on 06/30/2022), Disp: 1050 g, Rfl: 5   dicyclomine (BENTYL) 10 MG capsule, Take 1 capsule (10 mg total) by mouth every 8 (eight) hours as needed for spasms., Disp: 30 capsule, Rfl: 1   fexofenadine-pseudoephedrine (ALLEGRA-D 24) 180-240 MG 24 hr tablet, Take 1 tablet by mouth every evening. For allergy and congestion (Patient not taking: Reported on 06/30/2022), Disp: 30 tablet, Rfl: 11   fluticasone (FLONASE) 50 MCG/ACT nasal spray, Place 2 sprays into both nostrils daily. (Patient  not taking: Reported on 06/30/2022), Disp: 16 g, Rfl: 11   furosemide (LASIX) 20 MG tablet, Take 1 tablet (20 mg total) by mouth as needed. (Patient not taking: Reported on 06/30/2022), Disp: 30 tablet, Rfl: 3   glucose blood (ONETOUCH VERIO) test strip, Test BS twice daily Dx e11.9 (Patient not taking: Reported on 06/30/2022), Disp: 200 strip, Rfl: 3   ipratropium-albuterol (DUONEB) 0.5-2.5 (3) MG/3ML SOLN, Take 3 mLs by nebulization every 6 (six) hours as needed. (Patient not taking: Reported on 06/30/2022), Disp: 360 mL, Rfl: 1   Lancets (ONETOUCH DELICA PLUS 123XX123) MISC, Test BS twice daily Dx e11.9 (Patient not taking: Reported on 06/30/2022), Disp: 200 each, Rfl: 3   metFORMIN (GLUCOPHAGE-XR) 750 MG 24 hr tablet, Take 1 tablet (750 mg total) by mouth daily with breakfast., Disp: 90 tablet, Rfl: 3  Observations/Objective: Patient is well-developed, well-nourished in no acute distress.  Resting comfortably  at home.  Head is normocephalic, atraumatic.  No labored breathing.  Speech is clear and coherent with logical content.  Patient is alert and oriented at baseline.  Raspy voice Deep cough  Assessment and Plan:  Kimberly Obrien in today with chief complaint of URI   1. URI with cough and congestion 1. Take meds as prescribed 2. Use a cool mist humidifier especially during the winter months and when heat has been humid. 3. Use saline nose sprays frequently 4. Saline irrigations of the nose can be very helpful if done frequently.  * 4X daily for 1 week*  * Use of a nettie pot can be helpful with this. Follow directions with this* 5. Drink plenty of fluids 6. Keep thermostat turn down low 7.For any cough or congestion- mucinex 8. For fever or aces or pains- take tylenol or ibuprofen appropriate for age and weight.  * for fevers greater than 101 orally you may alternate ibuprofen and tylenol every  3 hours.    Meds ordered this encounter  Medications   amoxicillin  (AMOXIL) 875 MG tablet    Sig: Take 1 tablet (875 mg total) by mouth 2 (two) times daily. 1 po BID    Dispense:  20 tablet    Refill:  0    Order Specific Question:   Supervising Provider    Answer:   Caryl Pina A [[id number]]   predniSONE (DELTASONE) 20 MG tablet    Sig: Take 2 tablets (40 mg total) by mouth daily with breakfast for 5 days. 2 po daily for 5 days    Dispense:  10 tablet    Refill:  0    Order Specific Question:   Supervising Provider    Answer:   Caryl Pina A [id number]       Follow Up Instructions: I discussed the assessment and treatment plan with the patient. The patient was provided an opportunity to ask questions and all were answered. The patient agreed with the plan and demonstrated an understanding of the instructions.  A copy of instructions were sent to the patient via MyChart.  The patient was advised to call back or seek an in-person evaluation if the symptoms worsen or if the condition fails to improve as anticipated.  Time:  I spent 6 minutes with the patient via telehealth technology discussing the above problems/concerns.    Mary-Margaret Hassell Done, FNP

## 2022-10-02 NOTE — Patient Instructions (Signed)
Kimberly Obrien, thank you for joining Chevis Pretty, FNP for today's virtual visit.  While this provider is not your primary care provider (PCP), if your PCP is located in our provider database this encounter information will be shared with them immediately following your visit.   [facility] account gives you access to today's visit and all your visits, tests, and labs performed at [facility] " click here if you don't have a [facility] account or go to [email or web address]  Consent: (Patient) Kimberly Obrien provided verbal consent for this virtual visit at the beginning of the encounter.  Current Medications:  Current Outpatient Medications:    acetaminophen (TYLENOL) 500 MG tablet, Take 500 mg by mouth every 6 (six) hours as needed. (Patient not taking: Reported on 06/30/2022), Disp: , Rfl:    albuterol (VENTOLIN HFA) 108 (90 Base) MCG/ACT inhaler, Inhale 2 puffs into the lungs every 6 (six) hours as needed for wheezing or shortness of breath. (Patient not taking: Reported on 06/30/2022), Disp: 18 g, Rfl: 0   amoxicillin (AMOXIL) 875 MG tablet, Take 1 tablet (875 mg total) by mouth 2 (two) times daily. 1 po BID, Disp: 20 tablet, Rfl: 0   amoxicillin-clavulanate (AUGMENTIN) 875-125 MG tablet, Take 1 tablet by mouth 2 (two) times daily. Take all of this medication (Patient not taking: Reported on 06/30/2022), Disp: 20 tablet, Rfl: 0   Blood Glucose Monitoring Suppl (ONETOUCH VERIO REFLECT) w/Device KIT, Test BS twice daily Dx e11.9 (Patient not taking: Reported on 06/30/2022), Disp: 1 kit, Rfl: 0   diclofenac Sodium (VOLTAREN) 1 % GEL, Apply 4 g topically 4 (four) times daily. To each knee (Patient not taking: Reported on 06/30/2022), Disp: 1050 g, Rfl: 5   dicyclomine (BENTYL) 10 MG capsule, Take 1 capsule (10 mg total) by mouth every 8 (eight) hours as needed for spasms., Disp: 30 capsule, Rfl: 1   fexofenadine-pseudoephedrine (ALLEGRA-D 24)  180-240 MG 24 hr tablet, Take 1 tablet by mouth every evening. For allergy and congestion (Patient not taking: Reported on 06/30/2022), Disp: 30 tablet, Rfl: 11   fluticasone (FLONASE) 50 MCG/ACT nasal spray, Place 2 sprays into both nostrils daily. (Patient not taking: Reported on 06/30/2022), Disp: 16 g, Rfl: 11   furosemide (LASIX) 20 MG tablet, Take 1 tablet (20 mg total) by mouth as needed. (Patient not taking: Reported on 06/30/2022), Disp: 30 tablet, Rfl: 3   glucose blood (ONETOUCH VERIO) test strip, Test BS twice daily Dx e11.9 (Patient not taking: Reported on 06/30/2022), Disp: 200 strip, Rfl: 3   ipratropium-albuterol (DUONEB) 0.5-2.5 (3) MG/3ML SOLN, Take 3 mLs by nebulization every 6 (six) hours as needed. (Patient not taking: Reported on 06/30/2022), Disp: 360 mL, Rfl: 1   Lancets (ONETOUCH DELICA PLUS 123XX123) MISC, Test BS twice daily Dx e11.9 (Patient not taking: Reported on 06/30/2022), Disp: 200 each, Rfl: 3   metFORMIN (GLUCOPHAGE-XR) 750 MG 24 hr tablet, Take 1 tablet (750 mg total) by mouth daily with breakfast., Disp: 90 tablet, Rfl: 3   predniSONE (DELTASONE) 20 MG tablet, Take 2 tablets (40 mg total) by mouth daily with breakfast for 5 days. 2 po daily for 5 days, Disp: 10 tablet, Rfl: 0   Medications ordered in this encounter:  Meds ordered this encounter  Medications   amoxicillin (AMOXIL) 875 MG tablet    Sig: Take 1 tablet (875 mg total) by mouth 2 (two) times daily. 1 po BID    Dispense:  20 tablet    Refill:  0    Order Specific Question:   Supervising Provider    Answer:   Caryl Pina A [[id number]]   predniSONE (DELTASONE) 20 MG tablet    Sig: Take 2 tablets (40 mg total) by mouth daily with breakfast for 5 days. 2 po daily for 5 days    Dispense:  10 tablet    Refill:  0    Order Specific Question:   Supervising Provider    Answer:   Caryl Pina A [id number]     *If you need refills on other medications prior to your next appointment, please  contact your pharmacy*  Follow-Up: Call back or seek an in-person evaluation if the symptoms worsen or if the condition fails to improve as anticipated.  [place]  Other Instructions 1. Take meds as prescribed 2. Use a cool mist humidifier especially during the winter months and when heat has been humid. 3. Use saline nose sprays frequently 4. Saline irrigations of the nose can be very helpful if done frequently.  * 4X daily for 1 week*  * Use of a nettie pot can be helpful with this. Follow directions with this* 5. Drink plenty of fluids 6. Keep thermostat turn down low 7.For any cough or congestion- mucinex OTC 8. For fever or aces or pains- take tylenol or ibuprofen appropriate for age and weight.  * for fevers greater than 101 orally you may alternate ibuprofen and tylenol every  3 hours.      If you have been instructed to have an in-person evaluation today at a local Urgent Care facility, please use the link below. It will take you to a list of all of our available [facility], including address, phone number and hours of operation. Please do not delay care.  [facility]  If you or a family member do not have a primary care provider, use the link below to schedule a visit and establish care. When you choose a [facility] primary care physician or advanced practice provider, you gain a long-term partner in health. Find a Primary Care Provider  Learn more about [facility]'s in-office and virtual care options: [facility]

## 2022-10-04 ENCOUNTER — Encounter: Payer: Self-pay | Admitting: Gastroenterology

## 2022-10-06 ENCOUNTER — Encounter: Payer: Medicare Other | Admitting: Gastroenterology

## 2022-10-16 ENCOUNTER — Ambulatory Visit (INDEPENDENT_AMBULATORY_CARE_PROVIDER_SITE_OTHER): Payer: Medicare Other

## 2022-10-16 VITALS — Ht 64.0 in | Wt 200.0 lb

## 2022-10-16 DIAGNOSIS — Z0000 Encounter for general adult medical examination without abnormal findings: Secondary | ICD-10-CM

## 2022-10-16 NOTE — Progress Notes (Signed)
Subjective:   Kimberly Obrien is a 62 y.o. female who presents for Medicare Annual (Subsequent) preventive examination. I connected with  [name] on 10/16/22 by a audio enabled telemedicine application and verified that I am speaking with the correct person using two identifiers.  Patient Location: Home  Provider Location: Home Office  I discussed the limitations of evaluation and management by telemedicine. The patient expressed understanding and agreed to proceed.  Review of Systems     Cardiac Risk Factors include: advanced age (>68mn, >>104women);hypertension;diabetes mellitus;dyslipidemia     Objective:    Today's Vitals   10/16/22 0846  Weight: 200 lb (90.7 kg)  Height: '5\' 4"'$  (1.626 m)   Body mass index is 34.33 kg/m.     10/16/2022    8:50 AM 09/29/2021    2:49 PM 06/24/2021    2:11 PM 06/23/2021    6:23 AM 09/28/2020    2:36 PM 09/25/2019    1:56 PM 07/22/2018   11:00 AM  Advanced Directives  Does Patient Have a Medical Advance Directive? No No No No No No No  Would patient like information on creating a medical advance directive? No - Patient declined No - Patient declined No - Patient declined No - Patient declined No - Patient declined No - Patient declined No - Patient declined    Current Medications (verified) Outpatient Encounter Medications as of 10/16/2022  Medication Sig   acetaminophen (TYLENOL) 500 MG tablet Take 500 mg by mouth every 6 (six) hours as needed.   albuterol (VENTOLIN HFA) 108 (90 Base) MCG/ACT inhaler Inhale 2 puffs into the lungs every 6 (six) hours as needed for wheezing or shortness of breath.   amoxicillin-clavulanate (AUGMENTIN) 875-125 MG tablet Take 1 tablet by mouth 2 (two) times daily. Take all of this medication   Blood Glucose Monitoring Suppl (ONETOUCH VERIO REFLECT) w/Device KIT Test BS twice daily Dx e11.9   diclofenac Sodium (VOLTAREN) 1 % GEL Apply 4 g topically 4 (four) times daily. To each knee   dicyclomine  (BENTYL) 10 MG capsule Take 1 capsule (10 mg total) by mouth every 8 (eight) hours as needed for spasms.   fexofenadine-pseudoephedrine (ALLEGRA-D 24) 180-240 MG 24 hr tablet Take 1 tablet by mouth every evening. For allergy and congestion   fluticasone (FLONASE) 50 MCG/ACT nasal spray Place 2 sprays into both nostrils daily.   furosemide (LASIX) 20 MG tablet Take 1 tablet (20 mg total) by mouth as needed.   glucose blood (ONETOUCH VERIO) test strip Test BS twice daily Dx e11.9   ipratropium-albuterol (DUONEB) 0.5-2.5 (3) MG/3ML SOLN Take 3 mLs by nebulization every 6 (six) hours as needed.   Lancets (ONETOUCH DELICA PLUS [id number] MISC Test BS twice daily Dx e11.9   metFORMIN (GLUCOPHAGE-XR) 750 MG 24 hr tablet Take 1 tablet (750 mg total) by mouth daily with breakfast.   amoxicillin (AMOXIL) 875 MG tablet Take 1 tablet (875 mg total) by mouth 2 (two) times daily. 1 po BID (Patient not taking: Reported on 10/16/2022)   No facility-administered encounter medications on file as of 10/16/2022.    Allergies (verified) Aciphex [rabeprazole sodium], Fluconazole, Lansoprazole, Septra [sulfamethoxazole-trimethoprim], Sucralfate, Macrobid [nitrofurantoin], Vancomycin, Claritin [loratadine], Crestor [rosuvastatin], Doxycycline, Ibuprofen, Lipitor [atorvastatin], Metoprolol, Metronidazole, Nsaids, Omeprazole, and Rabeprazole   History: Past Medical History:  Diagnosis Date   Anemia    Anticoagulation monitoring by pharmacist    Arthritis    Asthma    Blood transfusion without reported diagnosis    Cataract  removed both eyes   Cirrhosis ([place])    COPD (chronic obstructive pulmonary disease) ([place])    Diabetes mellitus without complication (HCC)    Dyspnea    exertion   Elevated hemoglobin A1c 03/01/2018   Family history of Hodgkin's lymphoma    Family history of leukemia    Family history of lung cancer    Family history of stomach cancer    Family history of uterine cancer    GERD  (gastroesophageal reflux disease)    mild- diet related   Heart murmur    History of colon polyps    History of pulmonary embolism 02/04/2013   Hyperlipidemia    controlled   NASH (nonalcoholic steatohepatitis)    Neuromuscular disorder (HCC)    back surgery   PE (pulmonary embolism)    Routine Papanicolaou smear 09/12/2021   Sleep apnea    no cpap   Ureteral calculus, left 07/28/2021   UTI symptoms 08/18/2021   Past Surgical History:  Procedure Laterality Date   ABDOMINAL HYSTERECTOMY  2009   BREAST BIOPSY Left    CATARACT EXTRACTION, BILATERAL Bilateral    CHOLECYSTECTOMY     COLONOSCOPY WITH PROPOFOL N/A 05/07/2017   Procedure: COLONOSCOPY WITH PROPOFOL;  Surgeon: Laurence Spates, MD;  Location: [facility];  Service: Endoscopy;  Laterality: N/A;   ESOPHAGOGASTRODUODENOSCOPY (EGD) WITH PROPOFOL N/A 05/07/2017   Procedure: ESOPHAGOGASTRODUODENOSCOPY (EGD) WITH PROPOFOL;  Surgeon: Laurence Spates, MD;  Location: [facility];  Service: Endoscopy;  Laterality: N/A;   INCONTINENCE SURGERY  2009   with hysterectomy   IR TRANSCATHETER BX  03/15/2018   IR VENOGRAM HEPATIC W HEMODYNAMIC EVALUATION  03/15/2018   LEFT AND RIGHT HEART CATHETERIZATION WITH CORONARY ANGIOGRAM N/A 05/13/2014   Procedure: LEFT AND RIGHT HEART CATHETERIZATION WITH CORONARY ANGIOGRAM;  Surgeon: [name], MD;  Location: [facility] CATH LAB;  Service: Cardiovascular;  Laterality: N/A;   SPINE SURGERY     L5 S1 fusion x4   TUBAL LIGATION     WRIST SURGERY Right    Had surgery twice to shave bone for blood circulation improvement.   Family History  Problem Relation Age of Onset   Cirrhosis Mother        NASH   Irritable bowel syndrome Mother    Hyperlipidemia Father    Heart failure Father    Bipolar disorder Father    Heart disease Father    Colon polyps Sister        unknown number but fewer than patient   Irritable bowel syndrome Sister    Obesity Daughter    Endometrial cancer Daughter 64    Stomach cancer Maternal Uncle        dx 53s/50s   Lung cancer Maternal Uncle        dx 40s/50s, smoker   Cancer Maternal Uncle        NOS   Cancer Paternal Aunt        NOS   Cancer Paternal Aunt        NOS   Cancer Paternal Uncle        NOS   Heart failure Paternal Grandfather    Leukemia Cousin        dx 73s, died 2 weeks after diagnosis (maternal first cousin)   Cancer Cousin        NOS (paternal first cousin)   Hypertension Brother    Hodgkin's lymphoma Other 14       4th degree relative (mother's half-sibling's son)   Heart attack  Neg Hx    Miscarriages / Stillbirths Neg Hx    Colon cancer Neg Hx    Esophageal cancer Neg Hx    Rectal cancer Neg Hx    Breast cancer Neg Hx    Social History   Socioeconomic History   Marital status: Married    Spouse name: Shanon Brow   Number of children: 4   Years of education: GED   Highest education level: GED or equivalent  Occupational History   Occupation: Unemployed  Tobacco Use   Smoking status: Former    Packs/day: 2.00    Years: 50.00    Total pack years: 100.00    Types: Cigarettes    Start date: 08/22/1977    Quit date: 04/21/2012    Years since quitting: 10.4   Smokeless tobacco: Never  Vaping Use   Vaping Use: Never used  Substance and Sexual Activity   Alcohol use: No   Drug use: No   Sexual activity: Not Currently    Birth control/protection: Surgical    Comment: tubal and [facility]  Other Topics Concern   Not on file  Social History Narrative   Not on file   Social Determinants of Health   Financial Resource Strain: Low Risk  (10/16/2022)   Overall Financial Resource Strain (CARDIA)    Difficulty of Paying Living Expenses: Not hard at all  Food Insecurity: No Food Insecurity (10/16/2022)   Hunger Vital Sign    Worried About Running Out of Food in the Last Year: Never true    [place] in the Last Year: Never true  Transportation Needs: No Transportation Needs (10/16/2022)   PRAPARE - Civil engineer, contracting (Medical): No    Lack of Transportation (Non-Medical): No  Physical Activity: Insufficiently Active (10/16/2022)   Exercise Vital Sign    Days of Exercise per Week: 3 days    Minutes of Exercise per Session: 30 min  Stress: No Stress Concern Present (10/16/2022)   [place]    Feeling of Stress : Not at all  Social Connections: Moderately Isolated (10/16/2022)   Social Connection and Isolation Panel [NHANES]    Frequency of Communication with Friends and Family: More than three times a week    Frequency of Social Gatherings with Friends and Family: More than three times a week    Attends Religious Services: Never    Marine scientist or Organizations: No    Attends Music therapist: Never    Marital Status: Married    Tobacco Counseling Counseling given: Not Answered   Clinical Intake:  Pre-visit preparation completed: Yes  Pain : No/denies pain     Nutritional Risks: None Diabetes: Yes CBG done?: No Did pt. bring in CBG monitor from home?: No  How often do you need to have someone help you when you read instructions, pamphlets, or other written materials from your doctor or pharmacy?: 1 - Never  Diabetic?yes  Nutrition Risk Assessment:  Has the patient had any N/V/D within the last 2 months?  No  Does the patient have any non-healing wounds?  No  Has the patient had any unintentional weight loss or weight gain?  No   Diabetes:  Is the patient diabetic?  Yes  If diabetic, was a CBG obtained today?  No  Did the patient bring in their glucometer from home?  No  How often do you monitor your CBG's? Daily .  Financial Strains and Diabetes Management:  Are you having any financial strains with the device, your supplies or your medication? No .  Does the patient want to be seen by Chronic Care Management for management of their diabetes?  No  Would the patient like  to be referred to a Nutritionist or for Diabetic Management?  No   Diabetic Exams:  Diabetic Eye Exam: Completed 09/2022 Diabetic Foot Exam: Overdue, Pt has been advised about the importance in completing this exam. Pt is scheduled for diabetic foot exam on next office visit .   Interpreter Needed?: No  Information entered by :: Jadene Pierini, LPN   Activities of Daily Living    10/16/2022    8:50 AM  In your present state of health, do you have any difficulty performing the following activities:  Hearing? 0  Vision? 0  Difficulty concentrating or making decisions? 0  Walking or climbing stairs? 0  Dressing or bathing? 0  Doing errands, shopping? 0  Preparing Food and eating ? N  Using the Toilet? N  In the past six months, have you accidently leaked urine? N  Do you have problems with loss of bowel control? N  Managing your Medications? N  Managing your Finances? N  Housekeeping or managing your Housekeeping? N    Patient Care Team: Claretta Fraise, MD as PCP - General (Family Medicine) [name], MD as PCP - Cardiology (Cardiology) [name], MD as Consulting Physician (Cardiology) Estill Dooms, NP as Nurse Practitioner (Obstetrics and Gynecology) Armbruster, Carlota Raspberry, MD as Consulting Physician (Gastroenterology) Felipa Eth, Reece Leader., MD as Referring Physician (Urology)  Indicate any recent Medical Services you may have received from other than [facility] providers in the past year (date may be approximate).     Assessment:   This is a routine wellness examination for Marjorie.  Hearing/Vision screen Vision Screening - Comments:: Wears rx glasses - up to date with routine eye exams with  Dr.Johnson   Dietary issues and exercise activities discussed: Current Exercise Habits: Home exercise routine, Type of exercise: walking, Time (Minutes): 30, Frequency (Times/Week): 3, Weekly Exercise (Minutes/Week): 90, Intensity: Mild, Exercise limited  by: None identified   Goals Addressed             This Visit's Progress    DIET - INCREASE WATER INTAKE   On track    Try to drink 6-8 glasses of water daily     Exercise 150 min/wk Moderate Activity   On track      Depression Screen    10/16/2022    8:49 AM 05/30/2022    3:46 PM 11/16/2021   12:58 PM 11/16/2021   12:51 PM 09/29/2021    2:39 PM 06/21/2021   11:20 AM 05/31/2021   12:19 PM  PHQ 2/9 Scores  PHQ - 2 Score 0 1 1 0 2 3 0  PHQ- 9 Score  '10 10  8 12 9    '$ Fall Risk    10/16/2022    8:47 AM 05/30/2022    3:45 PM 12/19/2021   11:23 AM 11/16/2021   12:51 PM 09/29/2021    2:47 PM  Fall Risk   Falls in the past year? 0 0 0 0 0  Number falls in past yr: 0    0  Injury with Fall? 0    0  Risk for fall due to : No Fall Risks    No Fall Risks  Follow up Falls prevention discussed  Falls prevention discussed    FALL RISK PREVENTION PERTAINING TO THE HOME:  Any stairs in or around the home? No  If so, are there any without handrails? No  Home free of loose throw rugs in walkways, pet beds, electrical cords, etc? Yes  Adequate lighting in your home to reduce risk of falls? Yes   ASSISTIVE DEVICES UTILIZED TO PREVENT FALLS:  Life alert? No  Use of a cane, walker or w/c? No  Grab bars in the bathroom? No  Shower chair or bench in shower? No  Elevated toilet seat or a handicapped toilet? No       12/20/2017   12:18 PM 08/09/2016   11:43 AM 07/19/2015    4:06 PM  MMSE - Mini Mental State Exam  Orientation to time '5 5 5  '$ Orientation to Place '5 5 5  '$ Registration '3 3 3  '$ Attention/ Calculation '5 5 5  '$ Recall '3 3 3  '$ Language- name 2 objects '2 2 2  '$ Language- repeat '1 1 1  '$ Language- follow 3 step command '3 3 3  '$ Language- read & follow direction '1 1 1  '$ Write a sentence '1 1 1  '$ Copy design '1 1 1  '$ Total score '30 30 30        '$ 10/16/2022    8:51 AM 09/28/2020    2:40 PM 09/25/2019    2:01 PM  6CIT Screen  What Year? 0 points 0 points 0 points  What month? 0 points 0  points 0 points  What time? 0 points 0 points 0 points  Count back from 20 0 points 0 points 0 points  Months in reverse 0 points 0 points 0 points  Repeat phrase 0 points 0 points 0 points  Total Score 0 points 0 points 0 points    Immunizations Immunization History  Administered Date(s) Administered   Tdap 04/21/2011, 04/20/2017    TDAP status: Up to date  Flu Vaccine status: Declined, Education has been provided regarding the importance of this vaccine but patient still declined. Advised may receive this vaccine at local pharmacy or Health Dept. Aware to provide a copy of the vaccination record if obtained from local pharmacy or Health Dept. Verbalized acceptance and understanding.  Pneumococcal vaccine status: Declined,  Education has been provided regarding the importance of this vaccine but patient still declined. Advised may receive this vaccine at local pharmacy or Health Dept. Aware to provide a copy of the vaccination record if obtained from local pharmacy or Health Dept. Verbalized acceptance and understanding.   Covid-19 vaccine status: Declined, Education has been provided regarding the importance of this vaccine but patient still declined. Advised may receive this vaccine at local pharmacy or Health Dept.or vaccine clinic. Aware to provide a copy of the vaccination record if obtained from local pharmacy or Health Dept. Verbalized acceptance and understanding.  Qualifies for Shingles Vaccine? Yes   Zostavax completed No   Shingrix Completed?: No.    Education has been provided regarding the importance of this vaccine. Patient has been advised to call insurance company to determine out of pocket expense if they have not yet received this vaccine. Advised may also receive vaccine at local pharmacy or Health Dept. Verbalized acceptance and understanding.  Screening Tests Health Maintenance  Topic Date Due   COVID-19 Vaccine (1) Never done   Zoster Vaccines- Shingrix (1 of 2)  Never done   INFLUENZA VACCINE  11/19/2022 (Originally 03/21/2022)   DEXA SCAN  11/19/2022   Diabetic kidney evaluation -  eGFR measurement  05/31/2023   Diabetic kidney evaluation - Urine ACR  05/31/2023   MAMMOGRAM  08/29/2023   Lung Cancer Screening  09/20/2023   Medicare Annual Wellness (AWV)  10/17/2023   PAP SMEAR-Modifier  05/31/2024   COLONOSCOPY (Pts 45-70yr Insurance coverage will need to be confirmed)  09/06/2024   DTaP/Tdap/Td (3 - Td or Tdap) 04/21/2027   Hepatitis C Screening  Completed   HIV Screening  Completed   HPV VACCINES  Aged Out    Health Maintenance  Health Maintenance Due  Topic Date Due   COVID-19 Vaccine (1) Never done   Zoster Vaccines- Shingrix (1 of 2) Never done    Colorectal cancer screening: Type of screening: Colonoscopy. Completed 09/06/2021. Repeat every 3 years  Mammogram status: Completed 08/28/2022. Repeat every year  Bone Density status: Completed 11/18/2020. Results reflect: Bone density results: OSTEOPOROSIS. Repeat every 2 years.  Lung Cancer Screening: (Low Dose CT Chest recommended if Age 62-80years, 30 pack-year currently smoking OR have quit w/in 15years.) does qualify.   Lung Cancer Screening Referral: completed 09/19/2022  Additional Screening:  Hepatitis C Screening: does not qualify; Completed 12/17/2012  Vision Screening: Recommended annual ophthalmology exams for early detection of glaucoma and other disorders of the eye. Is the patient up to date with their annual eye exam?  Yes  Who is the provider or what is the name of the office in which the patient attends annual eye exams? Dr.Johnson  If pt is not established with a provider, would they like to be referred to a provider to establish care? No .   Dental Screening: Recommended annual dental exams for proper oral hygiene  Community Resource Referral / Chronic Care Management: CRR required this visit?  No   CCM required this visit?  No      Plan:     I have  personally reviewed and noted the following in the patient's chart:   Medical and social history Use of alcohol, tobacco or illicit drugs  Current medications and supplements including opioid prescriptions. Patient is not currently taking opioid prescriptions. Functional ability and status Nutritional status Physical activity Advanced directives List of other physicians Hospitalizations, surgeries, and ER visits in previous 12 months Vitals Screenings to include cognitive, depression, and falls Referrals and appointments  In addition, I have reviewed and discussed with patient certain preventive protocols, quality metrics, and best practice recommendations. A written personalized care plan for preventive services as well as general preventive health recommendations were provided to patient.     LDaphane Shepherd LPN   [id number]  Nurse Notes: Declined all Vaccine , states she is allergic and unable to complete -L.Wilson,LPN

## 2022-10-16 NOTE — Patient Instructions (Signed)
Kimberly Obrien , Thank you for taking time to come for your Medicare Wellness Visit. I appreciate your ongoing commitment to your health goals. Please review the following plan we discussed and let me know if I can assist you in the future.   These are the goals we discussed:  Goals       DIET - INCREASE WATER INTAKE      Try to drink 6-8 glasses of water daily      Exercise 150 min/wk Moderate Activity      Patient Stated (pt-stated)      I would like to be able to get the pain under control so I can move around more.        This is a list of the screening recommended for you and due dates:  Health Maintenance  Topic Date Due   COVID-19 Vaccine (1) Never done   Zoster (Shingles) Vaccine (1 of 2) Never done   Flu Shot  11/19/2022*   DEXA scan (bone density measurement)  11/19/2022   Yearly kidney function blood test for diabetes  05/31/2023   Yearly kidney health urinalysis for diabetes  05/31/2023   Mammogram  08/29/2023   Screening for Lung Cancer  09/20/2023   Medicare Annual Wellness Visit  10/17/2023   Pap Smear  05/31/2024   Colon Cancer Screening  09/06/2024   DTaP/Tdap/Td vaccine (3 - Td or Tdap) 04/21/2027   Hepatitis C Screening: USPSTF Recommendation to screen - Ages 5-79 yo.  Completed   HIV Screening  Completed   HPV Vaccine  Aged Out  *Topic was postponed. The date shown is not the original due date.    Advanced directives: Advance directive discussed with you today. I have provided a copy for you to complete at home and have notarized. Once this is complete please bring a copy in to our office so we can scan it into your chart.   Conditions/risks identified: Aim for 30 minutes of exercise or brisk walking, 6-8 glasses of water, and 5 servings of fruits and vegetables each day.   Next appointment: Follow up in one year for your annual wellness visit.   Preventive Care 40-64 Years, Female Preventive care refers to lifestyle choices and visits with your health  care provider that can promote health and wellness. What does preventive care include? A yearly physical exam. This is also called an annual well check. Dental exams once or twice a year. Routine eye exams. Ask your health care provider how often you should have your eyes checked. Personal lifestyle choices, including: Daily care of your teeth and gums. Regular physical activity. Eating a healthy diet. Avoiding tobacco and drug use. Limiting alcohol use. Practicing safe sex. Taking low-dose aspirin daily starting at age 95. Taking vitamin and mineral supplements as recommended by your health care provider. What happens during an annual well check? The services and screenings done by your health care provider during your annual well check will depend on your age, overall health, lifestyle risk factors, and family history of disease. Counseling  Your health care provider may ask you questions about your: Alcohol use. Tobacco use. Drug use. Emotional well-being. Home and relationship well-being. Sexual activity. Eating habits. Work and work Statistician. Method of birth control. Menstrual cycle. Pregnancy history. Screening  You may have the following tests or measurements: Height, weight, and BMI. Blood pressure. Lipid and cholesterol levels. These may be checked every 5 years, or more frequently if you are over 7 years old. Skin check.  Lung cancer screening. You may have this screening every year starting at age 73 if you have a 30-pack-year history of smoking and currently smoke or have quit within the past 15 years. Fecal occult blood test (FOBT) of the stool. You may have this test every year starting at age 2. Flexible sigmoidoscopy or colonoscopy. You may have a sigmoidoscopy every 5 years or a colonoscopy every 10 years starting at age 63. Hepatitis C blood test. Hepatitis B blood test. Sexually transmitted disease (STD) testing. Diabetes screening. This is done by  checking your blood sugar (glucose) after you have not eaten for a while (fasting). You may have this done every 1-3 years. Mammogram. This may be done every 1-2 years. Talk to your health care provider about when you should start having regular mammograms. This may depend on whether you have a family history of breast cancer. BRCA-related cancer screening. This may be done if you have a family history of breast, ovarian, tubal, or peritoneal cancers. Pelvic exam and Pap test. This may be done every 3 years starting at age 25. Starting at age 47, this may be done every 5 years if you have a Pap test in combination with an HPV test. Bone density scan. This is done to screen for osteoporosis. You may have this scan if you are at high risk for osteoporosis. Discuss your test results, treatment options, and if necessary, the need for more tests with your health care provider. Vaccines  Your health care provider may recommend certain vaccines, such as: Influenza vaccine. This is recommended every year. Tetanus, diphtheria, and acellular pertussis (Tdap, Td) vaccine. You may need a Td booster every 10 years. Zoster vaccine. You may need this after age 84. Pneumococcal 13-valent conjugate (PCV13) vaccine. You may need this if you have certain conditions and were not previously vaccinated. Pneumococcal polysaccharide (PPSV23) vaccine. You may need one or two doses if you smoke cigarettes or if you have certain conditions. Talk to your health care provider about which screenings and vaccines you need and how often you need them. This information is not intended to replace advice given to you by your health care provider. Make sure you discuss any questions you have with your health care provider. Document Released: 09/03/2015 Document Revised: 04/26/2016 Document Reviewed: 06/08/2015 Elsevier Interactive Patient Education  2017 [place] Prevention in the Home Falls can cause injuries. They  can happen to people of all ages. There are many things you can do to make your home safe and to help prevent falls. What can I do on the outside of my home? Regularly fix the edges of walkways and driveways and fix any cracks. Remove anything that might make you trip as you walk through a door, such as a raised step or threshold. Trim any bushes or trees on the path to your home. Use bright outdoor lighting. Clear any walking paths of anything that might make someone trip, such as rocks or tools. Regularly check to see if handrails are loose or broken. Make sure that both sides of any steps have handrails. Any raised decks and porches should have guardrails on the edges. Have any leaves, snow, or ice cleared regularly. Use sand or salt on walking paths during winter. Clean up any spills in your garage right away. This includes oil or grease spills. What can I do in the bathroom? Use night lights. Install grab bars by the toilet and in the tub and shower. Do not  use towel bars as grab bars. Use non-skid mats or decals in the tub or shower. If you need to sit down in the shower, use a plastic, non-slip stool. Keep the floor dry. Clean up any water that spills on the floor as soon as it happens. Remove soap buildup in the tub or shower regularly. Attach bath mats securely with double-sided non-slip rug tape. Do not have throw rugs and other things on the floor that can make you trip. What can I do in the bedroom? Use night lights. Make sure that you have a light by your bed that is easy to reach. Do not use any sheets or blankets that are too big for your bed. They should not hang down onto the floor. Have a firm chair that has side arms. You can use this for support while you get dressed. Do not have throw rugs and other things on the floor that can make you trip. What can I do in the kitchen? Clean up any spills right away. Avoid walking on wet floors. Keep items that you use a lot in  easy-to-reach places. If you need to reach something above you, use a strong step stool that has a grab bar. Keep electrical cords out of the way. Do not use floor polish or wax that makes floors slippery. If you must use wax, use non-skid floor wax. Do not have throw rugs and other things on the floor that can make you trip. What can I do with my stairs? Do not leave any items on the stairs. Make sure that there are handrails on both sides of the stairs and use them. Fix handrails that are broken or loose. Make sure that handrails are as long as the stairways. Check any carpeting to make sure that it is firmly attached to the stairs. Fix any carpet that is loose or worn. Avoid having throw rugs at the top or bottom of the stairs. If you do have throw rugs, attach them to the floor with carpet tape. Make sure that you have a light switch at the top of the stairs and the bottom of the stairs. If you do not have them, ask someone to add them for you. What else can I do to help prevent falls? Wear shoes that: Do not have high heels. Have rubber bottoms. Are comfortable and fit you well. Are closed at the toe. Do not wear sandals. If you use a stepladder: Make sure that it is fully opened. Do not climb a closed stepladder. Make sure that both sides of the stepladder are locked into place. Ask someone to hold it for you, if possible. Clearly mark and make sure that you can see: Any grab bars or handrails. First and last steps. Where the edge of each step is. Use tools that help you move around (mobility aids) if they are needed. These include: Canes. Walkers. Scooters. Crutches. Turn on the lights when you go into a dark area. Replace any light bulbs as soon as they burn out. Set up your furniture so you have a clear path. Avoid moving your furniture around. If any of your floors are uneven, fix them. If there are any pets around you, be aware of where they are. Review your medicines  with your doctor. Some medicines can make you feel dizzy. This can increase your chance of falling. Ask your doctor what other things that you can do to help prevent falls. This information is not intended to replace advice  given to you by your health care provider. Make sure you discuss any questions you have with your health care provider. Document Released: 06/03/2009 Document Revised: 01/13/2016 Document Reviewed: 09/11/2014 Elsevier Interactive Patient Education  2017 [employer].

## 2022-10-17 ENCOUNTER — Encounter: Payer: Self-pay | Admitting: Gastroenterology

## 2022-10-27 ENCOUNTER — Ambulatory Visit (AMBULATORY_SURGERY_CENTER): Payer: Medicare Other

## 2022-10-27 ENCOUNTER — Encounter: Payer: Self-pay | Admitting: Gastroenterology

## 2022-10-27 VITALS — Ht 64.0 in | Wt 200.0 lb

## 2022-10-27 DIAGNOSIS — I8500 Esophageal varices without bleeding: Secondary | ICD-10-CM

## 2022-10-27 DIAGNOSIS — K7460 Unspecified cirrhosis of liver: Secondary | ICD-10-CM

## 2022-10-27 NOTE — Progress Notes (Signed)

## 2022-11-13 ENCOUNTER — Telehealth: Payer: Self-pay

## 2022-11-13 DIAGNOSIS — K7460 Unspecified cirrhosis of liver: Secondary | ICD-10-CM

## 2022-11-13 NOTE — Telephone Encounter (Signed)
-----   Message from Roetta Sessions, [place] sent at 06/30/2022  3:45 PM EST ----- Regarding: RUQ U/S due Will be due for RUQ in April. She would like to have the morning of April 12th (or 10th? ) message patient to confirm

## 2022-11-13 NOTE — Telephone Encounter (Signed)
Placed order for RUQ U/S in April for [facility] screening.  Scheduled patient at [facility] on Wed, 4-10th at 9:30am, arr 9am NPO after midnight. MyChart message sent to patient with number to resch if needed.

## 2022-11-16 ENCOUNTER — Encounter: Payer: Self-pay | Admitting: Family Medicine

## 2022-11-16 ENCOUNTER — Ambulatory Visit (INDEPENDENT_AMBULATORY_CARE_PROVIDER_SITE_OTHER): Payer: Medicare Other | Admitting: Family Medicine

## 2022-11-16 VITALS — BP 96/52 | HR 69 | Temp 97.9°F | Ht <= 58 in | Wt 199.8 lb

## 2022-11-16 DIAGNOSIS — J329 Chronic sinusitis, unspecified: Secondary | ICD-10-CM

## 2022-11-16 DIAGNOSIS — J40 Bronchitis, not specified as acute or chronic: Secondary | ICD-10-CM

## 2022-11-16 MED ORDER — LEVOFLOXACIN 500 MG PO TABS: 500.0000 mg | ORAL_TABLET | Freq: Every day | ORAL | 0 refills | Status: DC

## 2022-11-16 MED ORDER — CHERATUSSIN AC 100-10 MG/5ML PO SOLN: 5.0000 mL | ORAL | 0 refills | Status: DC | PRN

## 2022-11-16 MED ORDER — ALBUTEROL SULFATE HFA 108 (90 BASE) MCG/ACT IN AERS: 2.0000 | INHALATION_SPRAY | Freq: Four times a day (QID) | RESPIRATORY_TRACT | 0 refills | Status: DC | PRN

## 2022-11-16 MED ORDER — BETAMETHASONE SOD PHOS & ACET 6 (3-3) MG/ML IJ SUSP
6.0000 mg | Freq: Once | INTRAMUSCULAR | Status: AC
  Administered 2022-11-16: 6 mg via INTRAMUSCULAR

## 2022-11-16 NOTE — Progress Notes (Signed)
Chief Complaint  Patient presents with   Cough   Nasal Congestion   Ear Pain   Shortness of Breath    HPI  Patient presents today for Patient presents with upper respiratory congestion. Rhinorrhea that is clear. There is moderate sore throat.  Ears hurst  Patient reports coughing frequently as well.  No sputum noted. There is no fever, chills or sweats. The patient reports  being short of breath. Onset was 6 days ago. Gradually worsening. Tried OTCs without improvement.  PMH: Smoking status noted ROS: Per HPI  Objective: BP (!) 96/52   Pulse 69   Temp 97.9 F (36.6 C)   Ht 1' (0.305 m)   Wt 199 lb 12.8 oz (90.6 kg)   SpO2 96%   BMI 975.52 kg/m  Gen: NAD, alert, cooperative with exam HEENT: NCAT, Nasal passages swollen, red TMS RED CV: RRR, good S1/S2, no murmur Resp: Bronchitis changes with scattered wheezes, non-labored Ext: No edema, warm Neuro: Alert and oriented, No gross deficits  Assessment and plan:  1. Sinobronchitis     Meds ordered this encounter  Medications   albuterol (VENTOLIN HFA) 108 (90 Base) MCG/ACT inhaler    Sig: Inhale 2 puffs into the lungs every 6 (six) hours as needed for wheezing or shortness of breath.    Dispense:  18 g    Refill:  0   betamethasone acetate-betamethasone sodium phosphate (CELESTONE) injection 6 mg   levofloxacin (LEVAQUIN) 500 MG tablet    Sig: Take 1 tablet (500 mg total) by mouth daily. For 10 days    Dispense:  10 tablet    Refill:  0   guaiFENesin-codeine (CHERATUSSIN AC) 100-10 MG/5ML syrup    Sig: Take 5 mLs by mouth every 4 (four) hours as needed for cough.    Dispense:  180 mL    Refill:  0      Follow up as needed.  Claretta Fraise, MD

## 2022-11-16 NOTE — Telephone Encounter (Signed)
Called and left detailed message for patient re: liver Ultrasound at [facility] on 4-10 to arrive at 9:00am. NPO after midnight. She can call [phone] to reschedule if needed

## 2022-11-19 ENCOUNTER — Other Ambulatory Visit: Payer: Self-pay | Admitting: Family

## 2022-11-19 MED ORDER — ALBUTEROL SULFATE HFA 108 (90 BASE) MCG/ACT IN AERS: 2.0000 | INHALATION_SPRAY | Freq: Four times a day (QID) | RESPIRATORY_TRACT | 0 refills | Status: AC | PRN

## 2022-11-19 MED ORDER — BENZONATATE 200 MG PO CAPS: 200.0000 mg | ORAL_CAPSULE | Freq: Three times a day (TID) | ORAL | 1 refills | Status: DC | PRN

## 2022-11-19 NOTE — Progress Notes (Signed)
Pt calls and requesting cough medication and inhaler be sent to pharmacy.  Evelina Dun, FNP

## 2022-11-29 ENCOUNTER — Ambulatory Visit (INDEPENDENT_AMBULATORY_CARE_PROVIDER_SITE_OTHER): Payer: Medicare Other | Admitting: Family Medicine

## 2022-11-29 ENCOUNTER — Encounter: Payer: Self-pay | Admitting: Family Medicine

## 2022-11-29 ENCOUNTER — Ambulatory Visit (HOSPITAL_COMMUNITY)
Admission: RE | Admit: 2022-11-29 | Discharge: 2022-11-29 | Disposition: A | Discharge status: Home / Self Care | Payer: Medicare Other | Source: Ambulatory Visit | Attending: Gastroenterology | Admitting: Gastroenterology

## 2022-11-29 VITALS — BP 113/57 | HR 82 | Temp 97.7°F | Wt 197.6 lb

## 2022-11-29 DIAGNOSIS — E785 Hyperlipidemia, unspecified: Secondary | ICD-10-CM | POA: Diagnosis not present

## 2022-11-29 DIAGNOSIS — R41840 Attention and concentration deficit: Secondary | ICD-10-CM

## 2022-11-29 DIAGNOSIS — K754 Autoimmune hepatitis: Secondary | ICD-10-CM | POA: Diagnosis not present

## 2022-11-29 DIAGNOSIS — J449 Chronic obstructive pulmonary disease, unspecified: Secondary | ICD-10-CM

## 2022-11-29 DIAGNOSIS — G8929 Other chronic pain: Secondary | ICD-10-CM | POA: Diagnosis not present

## 2022-11-29 DIAGNOSIS — K7460 Unspecified cirrhosis of liver: Secondary | ICD-10-CM | POA: Insufficient documentation

## 2022-11-29 DIAGNOSIS — M79645 Pain in left finger(s): Secondary | ICD-10-CM | POA: Diagnosis not present

## 2022-11-29 DIAGNOSIS — E119 Type 2 diabetes mellitus without complications: Secondary | ICD-10-CM | POA: Diagnosis not present

## 2022-11-29 DIAGNOSIS — J432 Centrilobular emphysema: Secondary | ICD-10-CM | POA: Diagnosis not present

## 2022-11-29 DIAGNOSIS — M79644 Pain in right finger(s): Secondary | ICD-10-CM | POA: Diagnosis not present

## 2022-11-29 LAB — URINALYSIS
Bilirubin, UA: NEGATIVE
Glucose, UA: NEGATIVE
Nitrite, UA: NEGATIVE
RBC, UA: NEGATIVE
Specific Gravity, UA: >1.03 — ABNORMAL HIGH (ref 1.005–1.030)
Urobilinogen, Ur: 1 mg/dL (ref 0.2–1.0)
pH, UA: 5.5 (ref 5.0–7.5)

## 2022-11-29 MED ORDER — METFORMIN HCL ER 750 MG PO TB24: 750.0000 mg | ORAL_TABLET | Freq: Every day | ORAL | 3 refills | Status: DC

## 2022-11-29 MED ORDER — IPRATROPIUM-ALBUTEROL 0.5-2.5 (3) MG/3ML IN SOLN: 3.0000 mL | Freq: Four times a day (QID) | RESPIRATORY_TRACT | 1 refills | Status: AC | PRN

## 2022-11-29 MED ORDER — PREGABALIN 50 MG PO CAPS: ORAL_CAPSULE | ORAL | 0 refills | Status: DC

## 2022-11-29 NOTE — Progress Notes (Signed)
Subjective:  Patient ID: Kimberly Obrien, female    DOB: 08/25/1960  Age: 62 y.o. MRN: [id number]  CC: Medical Management of Chronic Issues   HPI Kimberly Neighboratricia B Burlison presents forFollow-up of diabetes. Patient checks blood sugar at home.   Usually under 120 fasting and  postprandial Patient denies symptoms such as polyuria, polydipsia, excessive hunger, nausea No significant hypoglycemic spells noted. Medications reviewed. Pt reports taking them regularly without complication/adverse reaction being reported today.   Hx of hepatitis, autoimmune, with elevated bilirubin also has had GI bleeds so cannot tolerate NSAIDs. However having pain in multiple joints especially thumbs.  COPD is stable with current regimen.  Additionally she is concerned for ADD. Has difficulty with focus. Distractale and impulsive.  History Elease Hashimotoatricia has a past medical history of Anemia, Anticoagulation monitoring by pharmacist, Arthritis, Asthma, Blood transfusion without reported diagnosis, Cataract, Cirrhosis, COPD (chronic obstructive pulmonary disease), Diabetes mellitus without complication, Dyspnea, Elevated hemoglobin A1c (03/01/2018), Family history of Hodgkin's lymphoma, Family history of leukemia, Family history of lung cancer, Family history of stomach cancer, Family history of uterine cancer, GERD (gastroesophageal reflux disease), Heart murmur, History of colon polyps, History of pulmonary embolism (02/04/2013), Hyperlipidemia, NASH (nonalcoholic steatohepatitis), Neuromuscular disorder, PE (pulmonary embolism), Routine Papanicolaou smear (09/12/2021), Sleep apnea, Ureteral calculus, left (07/28/2021), and UTI symptoms (08/18/2021).   She has a past surgical history that includes Tubal ligation; Wrist surgery (Right); Cholecystectomy; Spine surgery; Abdominal hysterectomy (2009); left and right heart catheterization with coronary angiogram (N/A, 05/13/2014); Incontinence surgery (2009); Cataract extraction,  bilateral (Bilateral); Esophagogastroduodenoscopy (egd) with propofol (N/A, 05/07/2017); Colonoscopy with propofol (N/A, 05/07/2017); IR Venogram Hepatic W Hemodynamic Evaluation (03/15/2018); IR Transcatheter BX (03/15/2018); and Breast biopsy (Left).   Her family history includes Bipolar disorder in her father; Cancer in her cousin, maternal uncle, paternal aunt, paternal aunt, and paternal uncle; Cirrhosis in her mother; Colon polyps in her sister; Endometrial cancer (age of onset: 1634) in her daughter; Heart disease in her father; Heart failure in her father and paternal grandfather; Hodgkin's lymphoma (age of onset: 6714) in an other family member; Hyperlipidemia in her father; Hypertension in her brother; Irritable bowel syndrome in her mother and sister; Leukemia in her cousin; Lung cancer in her maternal uncle; Obesity in her daughter; Stomach cancer in her maternal uncle.She reports that she quit smoking about 10 years ago. Her smoking use included cigarettes. She started smoking about 45 years ago. She has a 100.00 pack-year smoking history. She has never used smokeless tobacco. She reports that she does not drink alcohol and does not use drugs.  Current Outpatient Medications on File Prior to Visit  Medication Sig Dispense Refill   acetaminophen (TYLENOL) 500 MG tablet Take 500 mg by mouth every 6 (six) hours as needed.     albuterol (VENTOLIN HFA) 108 (90 Base) MCG/ACT inhaler Inhale 2 puffs into the lungs every 6 (six) hours as needed for wheezing or shortness of breath. 18 g 0   benzonatate (TESSALON) 200 MG capsule Take 1 capsule (200 mg total) by mouth 3 (three) times daily as needed. 30 capsule 1   Blood Glucose Monitoring Suppl (ONETOUCH VERIO REFLECT) w/Device KIT Test BS twice daily Dx e11.9 1 kit 0   diclofenac Sodium (VOLTAREN) 1 % GEL Apply 4 g topically 4 (four) times daily. To each knee 1050 g 5   fluticasone (FLONASE) 50 MCG/ACT nasal spray Place 2 sprays into both nostrils daily.  16 g 11   glucose blood (ONETOUCH VERIO) test strip Test BS  twice daily Dx e11.9 200 strip 3   Lancets (ONETOUCH DELICA PLUS LANCET33G) MISC Test BS twice daily Dx e11.9 200 each 3   No current facility-administered medications on file prior to visit.    ROS Review of Systems  Constitutional: Negative.   HENT: Negative.    Eyes:  Negative for visual disturbance.  Respiratory:  Negative for shortness of breath.   Cardiovascular:  Negative for chest pain.  Gastrointestinal:  Negative for abdominal pain.  Musculoskeletal:  Positive for arthralgias (thumbs aweful).    Objective:  BP (!) 113/57   Pulse 82   Temp 97.7 F (36.5 C)   Wt 197 lb 9.6 oz (89.6 kg)   SpO2 93%   BMI 964.78 kg/m   BP Readings from Last 3 Encounters:  11/29/22 (!) 113/57  11/16/22 (!) 96/52  06/30/22 136/78    Wt Readings from Last 3 Encounters:  11/29/22 197 lb 9.6 oz (89.6 kg)  11/16/22 199 lb 12.8 oz (90.6 kg)  10/27/22 200 lb (90.7 kg)     Physical Exam Constitutional:      General: She is not in acute distress.    Appearance: She is well-developed.  Cardiovascular:     Rate and Rhythm: Normal rate and regular rhythm.  Pulmonary:     Breath sounds: Normal breath sounds.  Musculoskeletal:        General: Normal range of motion.  Skin:    General: Skin is warm and dry.  Neurological:     Mental Status: She is alert and oriented to person, place, and time.       Assessment & Plan:   Iolanda was seen today for medical management of chronic issues.  Diagnoses and all orders for this visit:  Hyperlipidemia, unspecified hyperlipidemia type -     CBC with Differential/Platelet -     CMP14+EGFR -     Lipid panel  Diabetes mellitus without complication -     metFORMIN (GLUCOPHAGE-XR) 750 MG 24 hr tablet; Take 1 tablet (750 mg total) by mouth daily with breakfast.  Centrilobular emphysema -     ipratropium-albuterol (DUONEB) 0.5-2.5 (3) MG/3ML SOLN; Take 3 mLs by nebulization  every 6 (six) hours as needed.  Autoimmune hepatitis -     Urinalysis  Chronic thumb pain, bilateral -     Ambulatory referral to Hand Surgery  Impaired concentration -     Ambulatory referral to Psychiatry  Other orders -     pregabalin (LYRICA) 50 MG capsule; 1 qhs X7 days , then 2 qhs X 7d, then 3 qhs X 7d, then 4 qhs      I have discontinued Elease Hashimoto B. Hellickson's levofloxacin and Cheratussin AC. I am also having her start on pregabalin. Additionally, I am having her maintain her acetaminophen, OneTouch Verio Reflect, fluticasone, OneTouch Verio, OneTouch Delica Plus Lancet33G, diclofenac Sodium, albuterol, benzonatate, metFORMIN, and ipratropium-albuterol.  Meds ordered this encounter  Medications   metFORMIN (GLUCOPHAGE-XR) 750 MG 24 hr tablet    Sig: Take 1 tablet (750 mg total) by mouth daily with breakfast.    Dispense:  90 tablet    Refill:  3   ipratropium-albuterol (DUONEB) 0.5-2.5 (3) MG/3ML SOLN    Sig: Take 3 mLs by nebulization every 6 (six) hours as needed.    Dispense:  360 mL    Refill:  1   pregabalin (LYRICA) 50 MG capsule    Sig: 1 qhs X7 days , then 2 qhs X 7d, then 3 qhs X 7d,  then 4 qhs    Dispense:  120 capsule    Refill:  0     Follow-up: Return in about 1 month (around 12/29/2022).  Mechele Claude, M.D.

## 2022-11-30 ENCOUNTER — Telehealth: Payer: Self-pay | Admitting: Family Medicine

## 2022-11-30 LAB — LIPID PANEL
Chol/HDL Ratio: 2.5 ratio (ref 0.0–4.4)
Cholesterol, Total: 172 mg/dL (ref 100–199)
HDL: 69 mg/dL (ref >39)
LDL Chol Calc (NIH): 80 mg/dL (ref 0–99)
Triglycerides: 131 mg/dL (ref 0–149)
VLDL Cholesterol Cal: 23 mg/dL (ref 5–40)

## 2022-11-30 LAB — CMP14+EGFR
ALT: 31 IU/L (ref 0–32)
AST: 37 IU/L (ref 0–40)
Albumin/Globulin Ratio: 1.7 (ref 1.2–2.2)
Albumin: 3.9 g/dL (ref 3.9–4.9)
Alkaline Phosphatase: 89 IU/L (ref 44–121)
BUN/Creatinine Ratio: 17 (ref 12–28)
BUN: 11 mg/dL (ref 8–27)
Bilirubin Total: 3.7 mg/dL — ABNORMAL HIGH (ref 0.0–1.2)
CO2: 22 mmol/L (ref 20–29)
Calcium: 9.2 mg/dL (ref 8.7–10.3)
Chloride: 105 mmol/L (ref 96–106)
Creatinine, Ser: 0.63 mg/dL (ref 0.57–1.00)
Globulin, Total: 2.3 g/dL (ref 1.5–4.5)
Glucose: 131 mg/dL — ABNORMAL HIGH (ref 70–99)
Potassium: 3.5 mmol/L (ref 3.5–5.2)
Sodium: 144 mmol/L (ref 134–144)
Total Protein: 6.2 g/dL (ref 6.0–8.5)
eGFR: 100 mL/min/{1.73_m2} (ref >59)

## 2022-11-30 LAB — CBC WITH DIFFERENTIAL/PLATELET
Basophils Absolute: 0 10*3/uL (ref 0.0–0.2)
Basos: 1 %
EOS (ABSOLUTE): 0.1 10*3/uL (ref 0.0–0.4)
Eos: 1 %
Hematocrit: 44.1 % (ref 34.0–46.6)
Hemoglobin: 15 g/dL (ref 11.1–15.9)
Immature Grans (Abs): 0 10*3/uL (ref 0.0–0.1)
Immature Granulocytes: 0 %
Lymphocytes Absolute: 1.3 10*3/uL (ref 0.7–3.1)
Lymphs: 16 %
MCH: 28.8 pg (ref 26.6–33.0)
MCHC: 34 g/dL (ref 31.5–35.7)
MCV: 85 fL (ref 79–97)
Monocytes Absolute: 0.4 10*3/uL (ref 0.1–0.9)
Monocytes: 5 %
Neutrophils Absolute: 6.4 10*3/uL (ref 1.4–7.0)
Neutrophils: 77 %
Platelets: 87 10*3/uL — CL (ref 150–450)
RBC: 5.21 x10E6/uL (ref 3.77–5.28)
RDW: 13.1 % (ref 11.7–15.4)
WBC: 8.3 10*3/uL (ref 3.4–10.8)

## 2022-11-30 NOTE — Telephone Encounter (Signed)
Patient calling about urinalysis from 4/10 and would like to discuss results. Please review and call back

## 2022-12-04 NOTE — Telephone Encounter (Signed)
Pt aware of provider feedback and voiced understanding. 

## 2022-12-04 NOTE — Telephone Encounter (Signed)
The specific gravity shows she hasn't been drinking enough water. The other changes are not sufficinet to be of any concern.

## 2022-12-11 ENCOUNTER — Telehealth: Payer: Self-pay | Admitting: Family Medicine

## 2022-12-11 DIAGNOSIS — J432 Centrilobular emphysema: Secondary | ICD-10-CM

## 2022-12-11 NOTE — Telephone Encounter (Signed)
  Prescription Request  12/11/2022   What is the name of the medication or equipment? FLONASE  Have you contacted your pharmacy to request a refill? YES  Which pharmacy would you like this sent to? [facility]

## 2022-12-12 MED ORDER — FLUTICASONE PROPIONATE 50 MCG/ACT NA SUSP: 2.0000 | Freq: Every day | NASAL | 1 refills | Status: AC

## 2022-12-12 NOTE — Telephone Encounter (Signed)
LMOVM refill sent to pharmacy 

## 2022-12-13 ENCOUNTER — Other Ambulatory Visit (HOSPITAL_COMMUNITY): Payer: Medicare Other

## 2022-12-14 ENCOUNTER — Telehealth: Payer: Self-pay

## 2022-12-14 ENCOUNTER — Encounter: Payer: Self-pay | Admitting: Certified Registered Nurse Anesthetist

## 2022-12-14 DIAGNOSIS — T466X5A Adverse effect of antihyperlipidemic and antiarteriosclerotic drugs, initial encounter: Secondary | ICD-10-CM

## 2022-12-14 NOTE — Progress Notes (Signed)
[facility]) [facility] Quality Pharmacy Team Statin Quality Measure Assessment  12/14/2022  [name] 19-Feb-1961 [id number]  I am a [facility] clinical pharmacist that reviews patients for statin quality initiatives.     Per review of chart and payor information, patient has a diagnosis of Diabetes & ASCVD. This places patient into the Statin Use in Patients with Diabetes (SUPD) & Statin use for Patients with Cardiovascular Disease (SPC) measure for CMS. The patient is not currently filling a statin prescription as a result of cirrhosis. There is no antihyperlipidemic medication on file.  Last year, dx code cirrhosis (K74.69) was added to the patient's encounter at [facility] 06/30/2022. Next appointment is on 12/15/2022. If deemed therapeutically appropriate, please consider associating exclusion code (see options below) to remove the patient from this year's measure.  Drug Induced Myopathy G72.0 Myopathy, unspecified G72.9 Myositis, unspecified M60.9 Rhabdomyolysis M62.82 Cirrhosis of liver K74.69 Prediabetes R73.03 PCOS E28.2  Thank you for allowing [facility] pharmacy to be a part of this patient's care.   Harlon Flor, PharmD Clinical Pharmacist  [facility] [phone]

## 2022-12-15 ENCOUNTER — Encounter: Payer: Self-pay | Admitting: Gastroenterology

## 2022-12-15 ENCOUNTER — Ambulatory Visit (AMBULATORY_SURGERY_CENTER): Payer: Medicare Other | Admitting: Gastroenterology

## 2022-12-15 VITALS — BP 115/46 | HR 80 | Temp 99.1°F | Resp 15 | Wt 200.0 lb

## 2022-12-15 DIAGNOSIS — K7460 Unspecified cirrhosis of liver: Secondary | ICD-10-CM

## 2022-12-15 DIAGNOSIS — I8500 Esophageal varices without bleeding: Secondary | ICD-10-CM

## 2022-12-15 DIAGNOSIS — I8510 Secondary esophageal varices without bleeding: Secondary | ICD-10-CM | POA: Diagnosis not present

## 2022-12-15 DIAGNOSIS — K766 Portal hypertension: Secondary | ICD-10-CM

## 2022-12-15 DIAGNOSIS — E119 Type 2 diabetes mellitus without complications: Secondary | ICD-10-CM | POA: Diagnosis not present

## 2022-12-15 MED ORDER — SODIUM CHLORIDE 0.9 % IV SOLN: 500.0000 mL | Freq: Once | INTRAVENOUS | Status: DC

## 2022-12-15 MED ORDER — CARVEDILOL 3.125 MG PO TABS
3.1250 mg | ORAL_TABLET | Freq: Two times a day (BID) | ORAL | 3 refills | Status: DC
Start: 2022-12-15 — End: 2023-12-21

## 2022-12-15 NOTE — Patient Instructions (Signed)
Thank you for coming in today! Resume previous diet and medications today. Return to regular daily activities tomorrow.  New Prescription:  Coreg (Carvedilol) 3.125 mg by mouth once daily.  If well tolerated after one week, increase to 2 times per day.      YOU HAD AN ENDOSCOPIC PROCEDURE TODAY AT THE [facility]:   Refer to the procedure report that was given to you for any specific questions about what was found during the examination.  If the procedure report does not answer your questions, please call your gastroenterologist to clarify.  If you requested that your care partner not be given the details of your procedure findings, then the procedure report has been included in a sealed envelope for you to review at your convenience later.  YOU SHOULD EXPECT: Some feelings of bloating in the abdomen. Passage of more gas than usual.  Walking can help get rid of the air that was put into your GI tract during the procedure and reduce the bloating. If you had a lower endoscopy (such as a colonoscopy or flexible sigmoidoscopy) you may notice spotting of blood in your stool or on the toilet paper. If you underwent a bowel prep for your procedure, you may not have a normal bowel movement for a few days.  Please Note:  You might notice some irritation and congestion in your nose or some drainage.  This is from the oxygen used during your procedure.  There is no need for concern and it should clear up in a day or so.  SYMPTOMS TO REPORT IMMEDIATELY:  Following upper endoscopy (EGD)  Vomiting of blood or coffee ground material  New chest pain or pain under the shoulder blades  Painful or persistently difficult swallowing  New shortness of breath  Fever of 100F or higher  Black, tarry-looking stools  For urgent or emergent issues, a gastroenterologist can be reached at any hour by calling [phone]. Do not use MyChart messaging for urgent concerns.    DIET:  We do recommend a  small meal at first, but then you may proceed to your regular diet.  Drink plenty of fluids but you should avoid alcoholic beverages for 24 hours.  ACTIVITY:  You should plan to take it easy for the rest of today and you should NOT DRIVE or use heavy machinery until tomorrow (because of the sedation medicines used during the test).    FOLLOW UP: Our staff will call the number listed on your records the next business day following your procedure.  We will call around 7:15- 8:00 am to check on you and address any questions or concerns that you may have regarding the information given to you following your procedure. If we do not reach you, we will leave a message.     If any biopsies were taken you will be contacted by phone or by letter within the next 1-3 weeks.  Please call us at [phone] if you have not heard about the biopsies in 3 weeks.    SIGNATURES/CONFIDENTIALITY: You and/or your care partner have signed paperwork which will be entered into your electronic medical record.  These signatures attest to the fact that that the information above on your After Visit Summary has been reviewed and is understood.  Full responsibility of the confidentiality of this discharge information lies with you and/or your care-partner.

## 2022-12-15 NOTE — Op Note (Signed)
[facility] Patient Name: Kimberly Obrien Procedure Date: 12/15/2022 3:02 PM MRN: [id number] Endoscopist: Viviann Spare P. Adela Lank , MD, [id number] Age: 62 Referring MD:  Date of Birth: 11/25/1960 Gender: Female Account #: [id number] Procedure:                Upper GI endoscopy Indications:              history of small esophageal varices monitored over                            time - last exam January 2023 Medicines:                Monitored Anesthesia Care Procedure:                Pre-Anesthesia Assessment:                           - Prior to the procedure, a History and Physical                            was performed, and patient medications and                            allergies were reviewed. The patient's tolerance of                            previous anesthesia was also reviewed. The risks                            and benefits of the procedure and the sedation                            options and risks were discussed with the patient.                            All questions were answered, and informed consent                            was obtained. Prior Anticoagulants: The patient has                            taken no anticoagulant or antiplatelet agents. ASA                            Grade Assessment: III - A patient with severe                            systemic disease. After reviewing the risks and                            benefits, the patient was deemed in satisfactory                            condition to undergo the procedure.  After obtaining informed consent, the endoscope was                            passed under direct vision. Throughout the                            procedure, the patient's blood pressure, pulse, and                            oxygen saturations were monitored continuously. The                            Olympus Scope [id number] was introduced through the                            mouth, and  advanced to the second part of duodenum.                            The upper GI endoscopy was accomplished without                            difficulty. The patient tolerated the procedure                            well. Scope In: Scope Out: Findings:                 Esophagogastric landmarks were identified: the                            Z-line was found at 38 cm, the gastroesophageal                            junction was found at 38 cm and the upper extent of                            the gastric folds was found at 40 cm from the                            incisors.                           A 2 cm hiatal hernia was present.                           Grade II varices were found in the middle third of                            the esophagus and in the lower third of the                            esophagus. They were small to medium in size - no  high risk stigmata for bleeding noted, but slightly                            increased in sized compared to the last exam.                           The exam of the esophagus was otherwise normal.                           Moderate portal hypertensive gastropathy was found                            in the entire examined stomach. Previously has                            tested negative for H pylori.                           The exam of the stomach was otherwise normal. No                            gastric varices.                           The examined duodenum was normal. Complications:            No immediate complications. Estimated blood loss:                            Minimal. Estimated Blood Loss:     Estimated blood loss was minimal. Impression:               - Esophagogastric landmarks identified.                           - 2 cm hiatal hernia.                           - Grade II esophageal varices - slightly increased                            in size since the last exam, but no high risk                             stigmata for bleeding                           - Portal hypertensive gastropathy.                           - Normal stomach otherwise - no gastric varices                           - Normal examined duodenum. Recommendation:           - Patient has a contact number available for  emergencies. The signs and symptoms of potential                            delayed complications were discussed with the                            patient. Return to normal activities tomorrow.                            Written discharge instructions were provided to the                            patient.                           - Resume previous diet.                           - Continue present medications.                           - Will discuss with the patient if she is willing                            to try Coreg or Nadolol to treat varices / portal                            HTN gastritis. Listed as having "intolerance" to                            metoprolol in the past which caused nausea /                            vomiting. If she does not go on nonspecific beta                            blockade would repeat EGD in 1 year for further                            surveillance, sooner if any decompensation of her                            liver disease in the interim Viviann Spare P. Ailin Rochford, MD 12/15/2022 3:18:41 PM This report has been signed electronically.

## 2022-12-15 NOTE — Progress Notes (Signed)
Report given to PACU, vss 

## 2022-12-15 NOTE — Progress Notes (Signed)
[facility] History and Physical   Primary Care Physician:  Mechele Claude, MD   Reason for Procedure:   Cirrhosis, esophageal varies  Plan:    EGD     HPI: Kimberly Obrien is a 62 y.o. female  here for  EGD to survey esophageal varices. Last exam January 2023 - small varices. Compensated cirrhosis historically, does have some thrombocytopenia. Otherwise feels well without any cardiopulmonary symptoms.   I have discussed risks / benefits of anesthesia and endoscopic procedure with [name] and they wish to proceed with the exams as outlined today.    Past Medical History:  Diagnosis Date   Anemia    Anticoagulation monitoring by pharmacist    Arthritis    Asthma    Blood transfusion without reported diagnosis    Cataract    removed both eyes   Cirrhosis (HCC)    COPD (chronic obstructive pulmonary disease) (HCC)    Diabetes mellitus without complication (HCC)    Dyspnea    exertion   Elevated hemoglobin A1c 03/01/2018   Family history of Hodgkin's lymphoma    Family history of leukemia    Family history of lung cancer    Family history of stomach cancer    Family history of uterine cancer    GERD (gastroesophageal reflux disease)    mild- diet related   Heart murmur    History of colon polyps    History of pulmonary embolism 02/04/2013   Hyperlipidemia    controlled   NASH (nonalcoholic steatohepatitis)    Neuromuscular disorder (HCC)    back surgery   PE (pulmonary embolism)    Routine Papanicolaou smear 09/12/2021   Sleep apnea    no cpap   Ureteral calculus, left 07/28/2021   UTI symptoms 08/18/2021    Past Surgical History:  Procedure Laterality Date   ABDOMINAL HYSTERECTOMY  2009   BREAST BIOPSY Left    CATARACT EXTRACTION, BILATERAL Bilateral    CHOLECYSTECTOMY     COLONOSCOPY WITH PROPOFOL N/A 05/07/2017   Procedure: COLONOSCOPY WITH PROPOFOL;  Surgeon: Carman Ching, MD;  Location: [facility];  Service: Endoscopy;   Laterality: N/A;   ESOPHAGOGASTRODUODENOSCOPY (EGD) WITH PROPOFOL N/A 05/07/2017   Procedure: ESOPHAGOGASTRODUODENOSCOPY (EGD) WITH PROPOFOL;  Surgeon: Carman Ching, MD;  Location: [facility];  Service: Endoscopy;  Laterality: N/A;   INCONTINENCE SURGERY  2009   with hysterectomy   IR TRANSCATHETER BX  03/15/2018   IR VENOGRAM HEPATIC W HEMODYNAMIC EVALUATION  03/15/2018   LEFT AND RIGHT HEART CATHETERIZATION WITH CORONARY ANGIOGRAM N/A 05/13/2014   Procedure: LEFT AND RIGHT HEART CATHETERIZATION WITH CORONARY ANGIOGRAM;  Surgeon: Kathleene Hazel, MD;  Location: [facility] CATH LAB;  Service: Cardiovascular;  Laterality: N/A;   SPINE SURGERY     L5 S1 fusion x4   TUBAL LIGATION     WRIST SURGERY Right    Had surgery twice to shave bone for blood circulation improvement.    Prior to Admission medications   Medication Sig Start Date End Date Taking? Authorizing Provider  acetaminophen (TYLENOL) 500 MG tablet Take 500 mg by mouth every 6 (six) hours as needed.    [provider]  albuterol (VENTOLIN HFA) 108 (90 Base) MCG/ACT inhaler Inhale 2 puffs into the lungs every 6 (six) hours as needed for wheezing or shortness of breath. 11/19/22   Jannifer Rodney A, FNP  benzonatate (TESSALON) 200 MG capsule Take 1 capsule (200 mg total) by mouth 3 (three) times daily as needed. 11/19/22  Jannifer Rodney A, FNP  Blood Glucose Monitoring Suppl (ONETOUCH VERIO REFLECT) w/Device KIT Test BS twice daily Dx e11.9 05/21/20   Mechele Claude, MD  diclofenac Sodium (VOLTAREN) 1 % GEL Apply 4 g topically 4 (four) times daily. To each knee 05/30/22   Mechele Claude, MD  fluticasone [facility]) 50 MCG/ACT nasal spray Place 2 sprays into both nostrils daily. 12/12/22   Mechele Claude, MD  glucose blood (ONETOUCH VERIO) test strip Test BS twice daily Dx e11.9 05/30/22   Mechele Claude, MD  ipratropium-albuterol (DUONEB) 0.5-2.5 (3) MG/3ML SOLN Take 3 mLs by nebulization every 6 (six) hours as needed. 11/29/22    Mechele Claude, MD  Lancets [facility] DELICA PLUS [place]) MISC Test BS twice daily Dx e11.9 05/30/22   Mechele Claude, MD  metFORMIN (GLUCOPHAGE-XR) 750 MG 24 hr tablet Take 1 tablet (750 mg total) by mouth daily with breakfast. 11/29/22   Mechele Claude, MD  pregabalin (LYRICA) 50 MG capsule 1 qhs X7 days , then 2 qhs X 7d, then 3 qhs X 7d, then 4 qhs 11/29/22   Mechele Claude, MD    Current Outpatient Medications  Medication Sig Dispense Refill   acetaminophen (TYLENOL) 500 MG tablet Take 500 mg by mouth every 6 (six) hours as needed.     albuterol (VENTOLIN HFA) 108 (90 Base) MCG/ACT inhaler Inhale 2 puffs into the lungs every 6 (six) hours as needed for wheezing or shortness of breath. 18 g 0   benzonatate (TESSALON) 200 MG capsule Take 1 capsule (200 mg total) by mouth 3 (three) times daily as needed. 30 capsule 1   Blood Glucose Monitoring Suppl (ONETOUCH VERIO REFLECT) w/Device KIT Test BS twice daily Dx e11.9 1 kit 0   diclofenac Sodium (VOLTAREN) 1 % GEL Apply 4 g topically 4 (four) times daily. To each knee 1050 g 5   fluticasone (FLONASE) 50 MCG/ACT nasal spray Place 2 sprays into both nostrils daily. 48 g 1   glucose blood (ONETOUCH VERIO) test strip Test BS twice daily Dx e11.9 200 strip 3   ipratropium-albuterol (DUONEB) 0.5-2.5 (3) MG/3ML SOLN Take 3 mLs by nebulization every 6 (six) hours as needed. 360 mL 1   Lancets (ONETOUCH DELICA PLUS LANCET33G) MISC Test BS twice daily Dx e11.9 200 each 3   metFORMIN (GLUCOPHAGE-XR) 750 MG 24 hr tablet Take 1 tablet (750 mg total) by mouth daily with breakfast. 90 tablet 3   pregabalin (LYRICA) 50 MG capsule 1 qhs X7 days , then 2 qhs X 7d, then 3 qhs X 7d, then 4 qhs 120 capsule 0   Current Facility-Administered Medications  Medication Dose Route Frequency Provider Last Rate Last Admin   0.9 %  sodium chloride infusion  500 mL Intravenous Once Gurshan Settlemire, Willaim Rayas, MD        Allergies as of 12/15/2022 - Review Complete 12/15/2022   Allergen Reaction Noted   Aciphex [rabeprazole sodium] Rash 12/10/2012   Fluconazole Rash and Hives 12/10/2012   Lansoprazole Hives 08/26/2016   Septra [sulfamethoxazole-trimethoprim] Nausea And Vomiting 12/10/2012   Sucralfate Nausea Only 03/07/2017   Macrobid [nitrofurantoin] Diarrhea 09/19/2021   Vancomycin Diarrhea 09/08/2016   Claritin [loratadine] Other (See Comments) 12/10/2012   Crestor [rosuvastatin] Other (See Comments) 01/19/2014   Doxycycline Rash 12/10/2012   Ibuprofen Other (See Comments) 12/10/2012   Lipitor [atorvastatin] Other (See Comments) 03/23/2015   Metoprolol Nausea And Vomiting 05/08/2014   Metronidazole Other (See Comments) and Nausea Only 08/24/2016   Nsaids Other (See Comments) 12/10/2012   Omeprazole  02/23/2016   Rabeprazole Rash     Family History  Problem Relation Age of Onset   Cirrhosis Mother        NASH   Irritable bowel syndrome Mother    Hyperlipidemia Father    Heart failure Father    Bipolar disorder Father    Heart disease Father    Colon polyps Sister        unknown number but fewer than patient   Irritable bowel syndrome Sister    Obesity Daughter    Endometrial cancer Daughter 71   Stomach cancer Maternal Uncle        dx 40s/50s   Lung cancer Maternal Uncle        dx 40s/50s, smoker   Cancer Maternal Uncle        NOS   Cancer Paternal Aunt        NOS   Cancer Paternal Aunt        NOS   Cancer Paternal Uncle        NOS   Heart failure Paternal Grandfather    Leukemia Cousin        dx 47s, died 2 weeks after diagnosis (maternal first cousin)   Cancer Cousin        NOS (paternal first cousin)   Hypertension Brother    Hodgkin's lymphoma Other 14       4th degree relative (mother's half-sibling's son)   Heart attack Neg Hx    Miscarriages / Stillbirths Neg Hx    Colon cancer Neg Hx    Esophageal cancer Neg Hx    Rectal cancer Neg Hx    Breast cancer Neg Hx     Social History   Socioeconomic History   Marital  status: Married    Spouse name: Onalee Hua   Number of children: 4   Years of education: GED   Highest education level: GED or equivalent  Occupational History   Occupation: Unemployed  Tobacco Use   Smoking status: Former    Packs/day: 2.00    Years: 50.00    Additional pack years: 0.00    Total pack years: 100.00    Types: Cigarettes    Start date: 08/22/1977    Quit date: 04/21/2012    Years since quitting: 10.6   Smokeless tobacco: Never  Vaping Use   Vaping Use: Never used  Substance and Sexual Activity   Alcohol use: No   Drug use: No   Sexual activity: Not Currently    Birth control/protection: Surgical    Comment: tubal and [facility]  Other Topics Concern   Not on file  Social History Narrative   Not on file   Social Determinants of Health   Financial Resource Strain: Low Risk  (10/16/2022)   Overall Financial Resource Strain (CARDIA)    Difficulty of Paying Living Expenses: Not hard at all  Food Insecurity: No Food Insecurity (10/16/2022)   Hunger Vital Sign    Worried About Running Out of Food in the Last Year: Never true    Ran Out of Food in the Last Year: Never true  Transportation Needs: No Transportation Needs (10/16/2022)   PRAPARE - Administrator, Civil Service (Medical): No    Lack of Transportation (Non-Medical): No  Physical Activity: Insufficiently Active (10/16/2022)   Exercise Vital Sign    Days of Exercise per Week: 3 days    Minutes of Exercise per Session: 30 min  Stress: No Stress Concern Present (10/16/2022)   [place]  [employer]stitute of Occupational [employer] - Occupational Stress Questionnaire    Feeling of Stress : Not at all  Social Connections: Moderately Isolated (10/16/2022)   Social Connection and Isolation Panel [NHANES]    Frequency of Communication with Friends and Family: More than three times a week    Frequency of Social Gatherings with Friends and Family: More than three times a week    Attends Religious Services: Never    Loss adjuster, chartered or Organizations: No    Attends Banker Meetings: Never    Marital Status: Married  Catering manager Violence: Not At Risk (10/16/2022)   Humiliation, Afraid, Rape, and Kick questionnaire    Fear of Current or Ex-Partner: No    Emotionally Abused: No    Physically Abused: No    Sexually Abused: No    Review of Systems: All other review of systems negative except as mentioned in the HPI.  Physical Exam: Vital signs BP 125/67 (BP Location: Right Arm, Patient Position: Sitting, Cuff Size: Normal)   Pulse 74   Temp 99.1 F (37.3 C) (Temporal)   Wt 200 lb (90.7 kg)   SpO2 94%   BMI 976.50 kg/m   General:   Alert,  Well-developed, pleasant and cooperative in NAD Lungs:  Clear throughout to auscultation.   Heart:  Regular rate and rhythm Abdomen:  Soft, nontender and nondistended.   Neuro/Psych:  Alert and cooperative. Normal mood and affect. A and O x 3  Harlin Rain, MD [facility]

## 2022-12-15 NOTE — Progress Notes (Signed)
1459 Robinul 0.1 mg IV given due large amount of secretions upon assessment.  MD made aware, vss  

## 2022-12-15 NOTE — Progress Notes (Signed)
vITALS-dt   Pt's states no medical or surgical changes since previsit or office visit.

## 2022-12-18 ENCOUNTER — Telehealth: Payer: Self-pay

## 2022-12-18 NOTE — Telephone Encounter (Signed)
No answer, left message to call if having any issues or concerns, B.Alexsandra Shontz RN 

## 2022-12-27 ENCOUNTER — Ambulatory Visit: Payer: Medicare Other | Admitting: Family Medicine

## 2023-01-23 DIAGNOSIS — M79645 Pain in left finger(s): Secondary | ICD-10-CM | POA: Diagnosis not present

## 2023-01-23 DIAGNOSIS — M79644 Pain in right finger(s): Secondary | ICD-10-CM | POA: Diagnosis not present

## 2023-05-02 ENCOUNTER — Ambulatory Visit: Payer: Medicare Other

## 2023-05-02 ENCOUNTER — Other Ambulatory Visit: Payer: Self-pay | Admitting: Family Medicine

## 2023-05-02 ENCOUNTER — Ambulatory Visit (INDEPENDENT_AMBULATORY_CARE_PROVIDER_SITE_OTHER): Payer: Medicare Other | Admitting: Family Medicine

## 2023-05-02 ENCOUNTER — Encounter: Payer: Self-pay | Admitting: Family Medicine

## 2023-05-02 VITALS — BP 115/60 | HR 72 | Temp 97.8°F | Ht 64.0 in | Wt 201.0 lb

## 2023-05-02 DIAGNOSIS — E119 Type 2 diabetes mellitus without complications: Secondary | ICD-10-CM

## 2023-05-02 DIAGNOSIS — Z780 Asymptomatic menopausal state: Secondary | ICD-10-CM

## 2023-05-02 DIAGNOSIS — K754 Autoimmune hepatitis: Secondary | ICD-10-CM

## 2023-05-02 DIAGNOSIS — Z7984 Long term (current) use of oral hypoglycemic drugs: Secondary | ICD-10-CM

## 2023-05-02 DIAGNOSIS — E785 Hyperlipidemia, unspecified: Secondary | ICD-10-CM

## 2023-05-02 LAB — BAYER DCA HB A1C WAIVED: HB A1C (BAYER DCA - WAIVED): 4.8 % (ref 4.8–5.6)

## 2023-05-02 MED ORDER — FLUOCINONIDE EMULSIFIED BASE 0.05 % EX CREA: 1.0000 | TOPICAL_CREAM | Freq: Two times a day (BID) | CUTANEOUS | 2 refills | Status: AC

## 2023-05-02 MED ORDER — ECONAZOLE NITRATE 1 % EX CREA: TOPICAL_CREAM | Freq: Two times a day (BID) | CUTANEOUS | 5 refills | Status: DC

## 2023-05-02 MED ORDER — EMPAGLIFLOZIN 10 MG PO TABS: 10.0000 mg | ORAL_TABLET | Freq: Every day | ORAL | 2 refills | Status: DC

## 2023-05-02 NOTE — Progress Notes (Signed)
Subjective:  Patient ID: Kimberly Obrien, female    DOB: 1961-05-17  Age: 62 y.o. MRN: [id number]  CC: Medical Management of Chronic Issues   HPI [name] presents for presents forFollow-up of diabetes. Patient checks blood sugar at home.   118 fasting and 130 postprandial Patient denies symptoms such as polyuria, polydipsia, excessive hunger, nausea No significant hypoglycemic spells noted. Medications reviewed. Pt reports taking them regularly. Having diarrhea and stool urgency. Wants to DC & try something else.   Lab Results  Component Value Date   HGBA1C 5.2 05/30/2022   HGBA1C 5.2 11/16/2021   HGBA1C 5.0 05/19/2021         05/02/2023    3:01 PM 11/29/2022    4:07 PM 11/29/2022    3:11 PM  Depression screen PHQ 2/9  Decreased Interest 1 1 0  Down, Depressed, Hopeless 1 0 0  PHQ - 2 Score 2 1 0  Altered sleeping 3 3   Tired, decreased energy 3 3   Change in appetite 0 0   Feeling bad or failure about yourself  0 0   Trouble concentrating 3 3   Moving slowly or fidgety/restless 0 1   Suicidal thoughts 0 0   PHQ-9 Score 11 11   Difficult doing work/chores Somewhat difficult Somewhat difficult     History Sivana has a past medical history of Anemia, Anticoagulation monitoring by pharmacist, Arthritis, Asthma, Blood transfusion without reported diagnosis, Cataract, Cirrhosis (HCC), COPD (chronic obstructive pulmonary disease) (HCC), Diabetes mellitus without complication (HCC), Dyspnea, Elevated hemoglobin A1c (03/01/2018), Family history of Hodgkin's lymphoma, Family history of leukemia, Family history of lung cancer, Family history of stomach cancer, Family history of uterine cancer, GERD (gastroesophageal reflux disease), Heart murmur, History of colon polyps, History of pulmonary embolism (02/04/2013), Hyperlipidemia, NASH (nonalcoholic steatohepatitis), Neuromuscular disorder (HCC), PE (pulmonary embolism), Routine Papanicolaou smear (09/12/2021), Sleep  apnea, Ureteral calculus, left (07/28/2021), and UTI symptoms (08/18/2021).   She has a past surgical history that includes Tubal ligation; Wrist surgery (Right); Cholecystectomy; Spine surgery; Abdominal hysterectomy (2009); left and right heart catheterization with coronary angiogram (N/A, 05/13/2014); Incontinence surgery (2009); Cataract extraction, bilateral (Bilateral); Esophagogastroduodenoscopy (egd) with propofol (N/A, 05/07/2017); Colonoscopy with propofol (N/A, 05/07/2017); IR Venogram Hepatic W Hemodynamic Evaluation (03/15/2018); IR Transcatheter BX (03/15/2018); and Breast biopsy (Left).   Her family history includes Bipolar disorder in her father; Cancer in her cousin, maternal uncle, paternal aunt, paternal aunt, and paternal uncle; Cirrhosis in her mother; Colon polyps in her sister; Endometrial cancer (age of onset: 83) in her daughter; Heart disease in her father; Heart failure in her father and paternal grandfather; Hodgkin's lymphoma (age of onset: 4) in an other family member; Hyperlipidemia in her father; Hypertension in her brother; Irritable bowel syndrome in her mother and sister; Leukemia in her cousin; Lung cancer in her maternal uncle; Obesity in her daughter; Stomach cancer in her maternal uncle.She reports that she quit smoking about 11 years ago. Her smoking use included cigarettes. She started smoking about 45 years ago. She has a 99.9 pack-year smoking history. She has never used smokeless tobacco. She reports that she does not drink alcohol and does not use drugs.    ROS Review of Systems  Constitutional: Negative.   HENT: Negative.    Eyes:  Negative for visual disturbance.  Respiratory:  Negative for shortness of breath.   Cardiovascular:  Negative for chest pain.  Gastrointestinal:  Negative for abdominal pain.  Musculoskeletal:  Negative for arthralgias.  Objective:  BP 115/60   Pulse 72   Temp 97.8 F (36.6 C)   Ht 5\' 4"  (1.626 m)   Wt 201 lb (91.2  kg)   SpO2 92%   BMI 34.50 kg/m   BP Readings from Last 3 Encounters:  05/02/23 115/60  12/15/22 (!) 115/46  11/29/22 (!) 113/57    Wt Readings from Last 3 Encounters:  05/02/23 201 lb (91.2 kg)  12/15/22 200 lb (90.7 kg)  11/29/22 197 lb 9.6 oz (89.6 kg)     Physical Exam Constitutional:      General: She is not in acute distress.    Appearance: She is well-developed.  Cardiovascular:     Rate and Rhythm: Normal rate and regular rhythm.  Pulmonary:     Breath sounds: Normal breath sounds.  Musculoskeletal:        General: Normal range of motion.  Skin:    General: Skin is warm and dry.  Neurological:     Mental Status: She is alert and oriented to person, place, and time.       Assessment & Plan:   Yvon was seen today for medical management of chronic issues.  Diagnoses and all orders for this visit:  Diabetes mellitus without complication (HCC) -     Microalbumin / creatinine urine ratio -     Bayer DCA Hb A1c Waived -     CBC with Differential/Platelet  Hyperlipidemia, unspecified hyperlipidemia type -     Lipid panel  Autoimmune hepatitis (HCC) -     CMP14+EGFR  Other orders -     fluocinonide-emollient (LIDEX-E) 0.05 % cream; Apply 1 Application topically 2 (two) times daily. -     econazole nitrate 1 % cream; Apply topically 2 (two) times daily. To affected areas -     empagliflozin (JARDIANCE) 10 MG TABS tablet; Take 1 tablet (10 mg total) by mouth daily.       I have discontinued [employer]'s diclofenac Sodium, benzonatate, metFORMIN, and pregabalin. I am also having her start on fluocinonide-emollient, econazole nitrate, and empagliflozin. Additionally, I am having her maintain her acetaminophen, OneTouch Verio Reflect, OneTouch Verio, OneTouch Delica Plus Lancet33G, albuterol, ipratropium-albuterol, fluticasone, and carvedilol.  Allergies as of 05/02/2023       Reactions   Aciphex [rabeprazole Sodium] Rash   Fluconazole  Rash, Hives   Lansoprazole Hives   Septra [sulfamethoxazole-trimethoprim] Nausea And Vomiting   "made me bleed"   Sucralfate Nausea Only   Macrobid [nitrofurantoin] Diarrhea   Vancomycin Diarrhea   Claritin [loratadine] Other (See Comments)   Tired   Crestor [rosuvastatin] Other (See Comments)   Elevated blood glucose and abdominal pain   Doxycycline Rash   Ibuprofen Other (See Comments)   Upset stomach   Lipitor [atorvastatin] Other (See Comments)   Myalgias, leg pain.  Problems with pancreas, sugars went up also.   Metoprolol Nausea And Vomiting   Metronidazole Other (See Comments), Nausea Only   Dizziness, nausea, dry mouth and some shortness of breath   Nsaids Other (See Comments)   Upset stomach   Omeprazole    bloating   Rabeprazole Rash        Medication List        Accurate as of May 02, 2023  3:22 PM. If you have any questions, ask your nurse or doctor.          STOP taking these medications    benzonatate 200 MG capsule Commonly known as: TESSALON Stopped by: [employer]  diclofenac Sodium 1 % Gel Commonly known as: VOLTAREN Stopped by: Broadus John Julitza Rickles   metFORMIN 750 MG 24 hr tablet Commonly known as: GLUCOPHAGE-XR Stopped by: Daivion Pape   pregabalin 50 MG capsule Commonly known as: Lyrica Stopped by: Franca Stakes       TAKE these medications    acetaminophen 500 MG tablet Commonly known as: TYLENOL Take 500 mg by mouth every 6 (six) hours as needed.   albuterol 108 (90 Base) MCG/ACT inhaler Commonly known as: VENTOLIN HFA Inhale 2 puffs into the lungs every 6 (six) hours as needed for wheezing or shortness of breath.   carvedilol 3.125 MG tablet Commonly known as: Coreg Take 1 tablet (3.125 mg total) by mouth 2 (two) times daily with a meal.   econazole nitrate 1 % cream Apply topically 2 (two) times daily. To affected areas Started by: Beryl Balz   empagliflozin 10 MG Tabs tablet Commonly known as:  Jardiance Take 1 tablet (10 mg total) by mouth daily. Started by: Lolitha Tortora   fluocinonide-emollient 0.05 % cream Commonly known as: LIDEX-E Apply 1 Application topically 2 (two) times daily. Started by: Asli Tokarski   fluticasone 50 MCG/ACT nasal spray Commonly known as: FLONASE Place 2 sprays into both nostrils daily.   ipratropium-albuterol 0.5-2.5 (3) MG/3ML Soln Commonly known as: DUONEB Take 3 mLs by nebulization every 6 (six) hours as needed.   OneTouch Delica Plus Lancet33G Misc Test BS twice daily Dx e11.9   OneTouch Verio Reflect w/Device Kit Test BS twice daily Dx e11.9   OneTouch Verio test strip Generic drug: glucose blood Test BS twice daily Dx e11.9         Follow-up: Return in about 3 months (around 08/01/2023).  Mechele Claude, M.D.

## 2023-05-03 LAB — LIPID PANEL
Chol/HDL Ratio: 2.3 ratio (ref 0.0–4.4)
Cholesterol, Total: 147 mg/dL (ref 100–199)
HDL: 64 mg/dL (ref >39)
LDL Chol Calc (NIH): 65 mg/dL (ref 0–99)
Triglycerides: 100 mg/dL (ref 0–149)
VLDL Cholesterol Cal: 18 mg/dL (ref 5–40)

## 2023-05-03 LAB — CMP14+EGFR
ALT: 23 IU/L (ref 0–32)
AST: 34 IU/L (ref 0–40)
Albumin: 3.8 g/dL — ABNORMAL LOW (ref 3.9–4.9)
Alkaline Phosphatase: 91 IU/L (ref 44–121)
BUN/Creatinine Ratio: 15 (ref 12–28)
BUN: 8 mg/dL (ref 8–27)
Bilirubin Total: 2.3 mg/dL — ABNORMAL HIGH (ref 0.0–1.2)
CO2: 23 mmol/L (ref 20–29)
Calcium: 9 mg/dL (ref 8.7–10.3)
Chloride: 106 mmol/L (ref 96–106)
Creatinine, Ser: 0.54 mg/dL — ABNORMAL LOW (ref 0.57–1.00)
Globulin, Total: 2.1 g/dL (ref 1.5–4.5)
Glucose: 104 mg/dL — ABNORMAL HIGH (ref 70–99)
Potassium: 3.6 mmol/L (ref 3.5–5.2)
Sodium: 143 mmol/L (ref 134–144)
Total Protein: 5.9 g/dL — ABNORMAL LOW (ref 6.0–8.5)
eGFR: 104 mL/min/{1.73_m2} (ref >59)

## 2023-05-03 LAB — MICROALBUMIN / CREATININE URINE RATIO
Creatinine, Urine: 191 mg/dL
Microalb/Creat Ratio: 9 mg/g{creat} (ref 0–29)
Microalbumin, Urine: 17.2 ug/mL

## 2023-05-03 LAB — CBC WITH DIFFERENTIAL/PLATELET
Basophils Absolute: 0 10*3/uL (ref 0.0–0.2)
Basos: 0 %
EOS (ABSOLUTE): 0 10*3/uL (ref 0.0–0.4)
Eos: 1 %
Hematocrit: 42 % (ref 34.0–46.6)
Hemoglobin: 14.4 g/dL (ref 11.1–15.9)
Immature Grans (Abs): 0 10*3/uL (ref 0.0–0.1)
Immature Granulocytes: 0 %
Lymphocytes Absolute: 0.9 10*3/uL (ref 0.7–3.1)
Lymphs: 19 %
MCH: 29.8 pg (ref 26.6–33.0)
MCHC: 34.3 g/dL (ref 31.5–35.7)
MCV: 87 fL (ref 79–97)
Monocytes Absolute: 0.3 10*3/uL (ref 0.1–0.9)
Monocytes: 6 %
Neutrophils Absolute: 3.3 10*3/uL (ref 1.4–7.0)
Neutrophils: 74 %
Platelets: 68 10*3/uL — CL (ref 150–450)
RBC: 4.84 x10E6/uL (ref 3.77–5.28)
RDW: 13.4 % (ref 11.7–15.4)
WBC: 4.5 10*3/uL (ref 3.4–10.8)

## 2023-05-06 ENCOUNTER — Other Ambulatory Visit: Payer: Self-pay | Admitting: Family Medicine

## 2023-05-07 NOTE — Telephone Encounter (Signed)
  Name from pharmacy: ECONAZOLE 1%        CRE    Pharmacy comment: Conflict with other medication, disease or allergy.  patient has had allergic reaction to fluconazole.  Please advise,  thanks, [facility].

## 2023-05-14 ENCOUNTER — Telehealth: Payer: Self-pay

## 2023-05-14 DIAGNOSIS — K7460 Unspecified cirrhosis of liver: Secondary | ICD-10-CM

## 2023-05-14 NOTE — Telephone Encounter (Signed)
-----   Message from [facility] sent at 11/30/2022  8:59 AM EDT ----- Regarding: RUQ U/S RUQ U/S due around 05-31-23 - hcc screening

## 2023-05-14 NOTE — Telephone Encounter (Signed)
Order for RUQ ultrasound placed.  Message to schedulers to call patient to schedule. MyChart message to patient

## 2023-07-05 ENCOUNTER — Encounter: Payer: Self-pay | Admitting: Nurse Practitioner

## 2023-07-05 ENCOUNTER — Telehealth: Payer: Medicare Other | Admitting: Nurse Practitioner

## 2023-07-05 DIAGNOSIS — J0110 Acute frontal sinusitis, unspecified: Secondary | ICD-10-CM

## 2023-07-05 MED ORDER — AZITHROMYCIN 250 MG PO TABS
ORAL_TABLET | ORAL | 0 refills | Status: DC
Start: 2023-07-05 — End: 2023-09-05

## 2023-07-05 MED ORDER — METHYLPREDNISOLONE 4 MG PO TBPK
ORAL_TABLET | ORAL | 0 refills | Status: DC
Start: 2023-07-05 — End: 2023-09-05

## 2023-07-05 NOTE — Patient Instructions (Signed)

## 2023-07-05 NOTE — Progress Notes (Signed)
Virtual Visit via video Note Due to COVID-19 pandemic this visit was conducted virtually. This visit type was conducted due to national recommendations for restrictions regarding the COVID-19 Pandemic (e.g. social distancing, sheltering in place) in an effort to limit this patient's exposure and mitigate transmission in our community. All issues noted in this document were discussed and addressed.  A physical exam was not performed with this format.   I connected with Kimberly Obrien on 07/05/2023 at 0915 by name and date of birth and verified that I am speaking with the correct person using two identifiers. Kimberly Obrien is currently located at [facility] is currently with them during visit. The provider, Martina Sinner, DNP is located in their office at time of visit.  I discussed the limitations, risks, security and privacy concerns of performing an evaluation and management service by virtual visit and the availability of in person appointments. I also discussed with the patient that there may be a patient responsible charge related to this service. The patient expressed understanding and agreed to proceed.  Subjective:  Patient ID: Kimberly Obrien, female    DOB: 04-22-1961, 62 y.o.   MRN: [id number]  Chief Complaint:  Cough (Purulent sputum for over 7 days)   HPI: Kimberly Obrien is a 62 y.o. female presenting on 07/05/2023 for Cough (Purulent sputum for over 7 days)  Client reports that she has been around sick people for the last week has been, grandchildren were sick she developed cough that has not been relieved despite taking over-the-counter medication.  Reports green sputum with nasal congestion she has past history of COPD with emphysema.  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   Thrombocytopenia (HCC)    History of colon polyps    Cervicitis 06/21/2021   H/O vaginal bleeding 06/21/2021   Gross hematuria 07/28/2021   Aortic atherosclerosis (HCC) 02/13/2022   Acute non-recurrent frontal sinusitis 07/05/2023   Resolved Ambulatory Problems    Diagnosis Date Noted   Chronic pulmonary edema 11/21/2012   History of pulmonary embolism 02/04/2013   Rash and nonspecific skin eruption 02/04/2013   Other fatigue 02/04/2013   Fatty liver 05/25/2013   Pulmonary embolism (HCC) 04/17/2016   RUQ pain 01/30/2017   Current use of long term anticoagulation 01/30/2017   Elevated hemoglobin A1c 03/01/2018   Family history of uterine cancer    Family history of stomach cancer    Family history of lung cancer    Family history of Hodgkin's lymphoma    Family history of leukemia    Genetic testing 10/22/2020   Ureteral calculus, left 07/28/2021   UTI symptoms 08/18/2021   Routine Papanicolaou smear 09/12/2021   Past Medical History:  Diagnosis Date   Anemia    Anticoagulation monitoring by pharmacist    Arthritis    Asthma    Blood transfusion without reported diagnosis    Cataract    Cirrhosis (  HCC)    Diabetes mellitus without complication (HCC)    Dyspnea    GERD (gastroesophageal reflux disease)    Heart murmur    Hyperlipidemia    NASH (nonalcoholic steatohepatitis)    Neuromuscular disorder (HCC)    PE (pulmonary embolism)    Sleep apnea     Relevant past medical, surgical, family, and social history reviewed and updated as indicated.   Allergies and medications reviewed and updated.   Past Medical History:  Diagnosis Date   Anemia    Anticoagulation monitoring by pharmacist    Arthritis    Asthma    Blood transfusion without reported diagnosis    Cataract    removed both eyes   Cirrhosis (HCC)    COPD (chronic obstructive pulmonary disease) (HCC)    Diabetes mellitus without complication (HCC)    Dyspnea    exertion   Elevated hemoglobin A1c 03/01/2018   Family history of Hodgkin's lymphoma    Family history of leukemia    Family history of lung cancer    Family history of stomach cancer    Family history of uterine cancer    GERD (gastroesophageal reflux disease)    mild- diet related   Heart murmur    History of colon polyps    History of pulmonary embolism 02/04/2013   Hyperlipidemia    controlled   NASH (nonalcoholic steatohepatitis)    Neuromuscular disorder (HCC)    back surgery   PE (pulmonary embolism)    Routine Papanicolaou smear 09/12/2021   Sleep apnea    no cpap   Ureteral calculus, left 07/28/2021   UTI symptoms 08/18/2021    Past Surgical History:  Procedure Laterality Date   ABDOMINAL HYSTERECTOMY  2009   BREAST BIOPSY Left    CATARACT EXTRACTION, BILATERAL Bilateral    CHOLECYSTECTOMY     COLONOSCOPY WITH PROPOFOL N/A 05/07/2017   Procedure: COLONOSCOPY WITH PROPOFOL;  Surgeon: Carman Ching, MD;  Location: [facility];  Service: Endoscopy;  Laterality: N/A;   ESOPHAGOGASTRODUODENOSCOPY (EGD) WITH PROPOFOL N/A 05/07/2017   Procedure: ESOPHAGOGASTRODUODENOSCOPY (EGD) WITH PROPOFOL;  Surgeon: Carman Ching, MD;  Location: [facility];  Service: Endoscopy;  Laterality: N/A;   INCONTINENCE SURGERY  2009   with hysterectomy   IR TRANSCATHETER BX  03/15/2018   IR VENOGRAM HEPATIC W HEMODYNAMIC EVALUATION  03/15/2018   LEFT AND RIGHT HEART CATHETERIZATION WITH CORONARY ANGIOGRAM N/A 05/13/2014   Procedure: LEFT AND RIGHT HEART CATHETERIZATION WITH CORONARY ANGIOGRAM;  Surgeon:  Kathleene Hazel, MD;  Location[facility] CATH LAB;  Service: Cardiovascular;  Laterality: N/A;   SPINE SURGERY     L5 S1 fusion x4   TUBAL LIGATION     WRIST SURGERY Right    Had surgery twice to shave bone for blood circulation improvement.    Social History   Socioeconomic History   Marital status: Married    Spouse name: Onalee Hua   Number of children: 4   Years of education: GED   Highest education level: GED or equivalent  Occupational History   Occupation: Unemployed  Tobacco Use   Smoking status: Former    Current packs/day: 0.00    Average packs/day: 2.0 packs/day for 50.0 years (99.9 ttl pk-yrs)    Types: Cigarettes    Start date: 08/22/1977    Quit date: 04/21/2012    Years since quitting: 11.2   Smokeless tobacco: Never  Vaping Use   Vaping status: Never Used  Substance and Sexual Activity   Alcohol use: No  Drug use: No   Sexual activity: Not Currently    Birth control/protection: Surgical    Comment: tubal and [facility]  Other Topics Concern   Not on file  Social History Narrative   Not on file   Social Determinants of Health   Financial Resource Strain: Low Risk  (10/16/2022)   Overall Financial Resource Strain (CARDIA)    Difficulty of Paying Living Expenses: Not hard at all  Food Insecurity: No Food Insecurity (10/16/2022)   Hunger Vital Sign    Worried About Running Out of Food in the Last Year: Never true    Ran Out of Food in the Last Year: Never true  Transportation Needs: No Transportation Needs (10/16/2022)   PRAPARE - Transportation    Lack of Transportation (Medical): No    Lack of Transportation (Non-Medical): No  Physical Activity: Insufficiently Active (10/16/2022)   Exercise Vital Sign    Days of Exercise per Week: 3 days    Minutes of Exercise per Session: 30 min  Stress: No Stress Concern Present (10/16/2022)   [employer] of Occupational Health - Occupational Stress Questionnaire    Feeling of Stress : Not at all  Social Connections:  Moderately Isolated (10/16/2022)   Social Connection and Isolation Panel [NHANES]    Frequency of Communication with Friends and Family: More than three times a week    Frequency of Social Gatherings with Friends and Family: More than three times a week    Attends Religious Services: Never    Database administrator or Organizations: No    Attends Banker Meetings: Never    Marital Status: Married  Catering manager Violence: Not At Risk (10/16/2022)   Humiliation, Afraid, Rape, and Kick questionnaire    Fear of Current or Ex-Partner: No    Emotionally Abused: No    Physically Abused: No    Sexually Abused: No    Outpatient Encounter Medications as of 07/05/2023  Medication Sig   azithromycin (ZITHROMAX Z-PAK) 250 MG tablet 2-tabs on day one and 1-tab until done   methylPREDNISolone (MEDROL DOSEPAK) 4 MG TBPK tablet Follow instructions on the box   acetaminophen (TYLENOL) 500 MG tablet Take 500 mg by mouth every 6 (six) hours as needed.   albuterol (VENTOLIN HFA) 108 (90 Base) MCG/ACT inhaler Inhale 2 puffs into the lungs every 6 (six) hours as needed for wheezing or shortness of breath.   Blood Glucose Monitoring Suppl (ONETOUCH VERIO REFLECT) w/Device KIT Test BS twice daily Dx e11.9   carvedilol (COREG) 3.125 MG tablet Take 1 tablet (3.125 mg total) by mouth 2 (two) times daily with a meal.   econazole nitrate 1 % cream Apply topically 2 (two) times daily. To affected areas   empagliflozin (JARDIANCE) 10 MG TABS tablet Take 1 tablet (10 mg total) by mouth daily.   fluocinonide-emollient (LIDEX-E) 0.05 % cream Apply 1 Application topically 2 (two) times daily.   fluticasone (FLONASE) 50 MCG/ACT nasal spray Place 2 sprays into both nostrils daily.   glucose blood (ONETOUCH VERIO) test strip Test BS twice daily Dx e11.9   ipratropium-albuterol (DUONEB) 0.5-2.5 (3) MG/3ML SOLN Take 3 mLs by nebulization every 6 (six) hours as needed.   Lancets (ONETOUCH DELICA PLUS LANCET33G)  MISC Test BS twice daily Dx e11.9   No facility-administered encounter medications on file as of 07/05/2023.    Allergies  Allergen Reactions   Aciphex [Rabeprazole Sodium] Rash   Fluconazole Rash and Hives   Lansoprazole Hives   Septra [Sulfamethoxazole-Trimethoprim]  Nausea And Vomiting    "made me bleed"   Sucralfate Nausea Only   Macrobid [Nitrofurantoin] Diarrhea   Vancomycin Diarrhea   Claritin [Loratadine] Other (See Comments)    Tired    Crestor [Rosuvastatin] Other (See Comments)    Elevated blood glucose and abdominal pain   Doxycycline Rash   Ibuprofen Other (See Comments)    Upset stomach   Lipitor [Atorvastatin] Other (See Comments)    Myalgias, leg pain.  Problems with pancreas, sugars went up also.   Metoprolol Nausea And Vomiting   Metronidazole Other (See Comments) and Nausea Only    Dizziness, nausea, dry mouth and some shortness of breath   Nsaids Other (See Comments)    Upset stomach   Omeprazole     bloating   Rabeprazole Rash    Review of Systems  Constitutional:  Negative for activity change, appetite change, chills and fever.  HENT:  Positive for congestion and postnasal drip.   Eyes:  Negative for pain.  Respiratory:  Positive for cough. Negative for choking.        Productive cough with green sputum  Cardiovascular:  Negative for chest pain, palpitations and leg swelling.  Endocrine: Negative for cold intolerance and heat intolerance.  Musculoskeletal:  Negative for myalgias.  Skin:  Negative for color change, rash and wound.  Neurological:  Negative for dizziness and headaches.    Observations/Objective: No vital signs or physical exam, this was a virtual health encounter.  Pt alert and oriented, answers all questions appropriately, and able to speak in full sentences.    Assessment and Plan: Bena was seen today for cough.  Diagnoses and all orders for this visit:  Acute non-recurrent frontal sinusitis -     azithromycin  (ZITHROMAX Z-PAK) 250 MG tablet; 2-tabs on day one and 1-tab until done -     methylPREDNISolone (MEDROL DOSEPAK) 4 MG TBPK tablet; Follow instructions on the box   Nary is a 62 year old Caucasian female seen via video visit for URI symptoms, no acute distress Purulent cough: Close RTC 2 months #6 dispense, and short course of prednisone Medrol Dosepak hide instructed to follow the instruction and will start Z-Pak and Medrol Dosepak Increase hydration Tylenol and ibuprofen for the fever Okay to get Coricidin over-the-counter to help with the cough All question answered Follow Up Instructions: Return if symptoms worsen or fail to improve.    I discussed the assessment and treatment plan with the patient. The patient was provided an opportunity to ask questions and all were answered. The patient agreed with the plan and demonstrated an understanding of the instructions.   The patient was advised to call back or seek an in-person evaluation if the symptoms worsen or if the condition fails to improve as anticipated.  The above assessment and management plan was discussed with the patient. The patient verbalized understanding of and has agreed to the management plan. Patient is aware to call the clinic if they develop any new symptoms or if symptoms persist or worsen. Patient is aware when to return to the clinic for a follow-up visit. Patient educated on when it is appropriate to go to the emergency department.    I provided 10 minutes of time during this video encounter.   Arrie Aran Santa Lighter, DNP [facility] [address] [place], [place] [address] [phone] 07/05/2023  It appears that you have a viral upper respiratory infection (cold).  Cold symptoms can last up to 2 weeks.  I  recommend that you only use cold medications that are safe in high blood pressure like Coricidin (generic is fine).  Other cold medications can increase your blood  pressure.    - Get plenty of rest and drink plenty of fluids. - Try to breathe moist air. Use a cold mist humidifier. - Consume warm fluids (soup or tea) to provide relief for a stuffy nose and to loosen phlegm. - For nasal stuffiness, try saline nasal spray or a Neti Pot.  Afrin nasal spray can also be used but this product should not be used longer than 3 days or it will cause rebound nasal stuffiness (worsening nasal congestion). - For sore throat pain relief: use chloraseptic spray, suck on throat lozenges, hard candy or popsicles; gargle with warm salt water (1/4 tsp. salt per 8 oz. of water); and eat soft, bland foods. - Eat a well-balanced diet. If you cannot, ensure you are getting enough nutrients by taking a daily multivitamin. - Avoid dairy products, as they can thicken phlegm. - Avoid alcohol, as it impairs your body's immune system.  CONTACT YOUR DOCTOR IF YOU EXPERIENCE ANY OF THE FOLLOWING: - High fever - Ear pain - Sinus-type headache - Unusually severe cold symptoms - Cough that gets worse while other cold symptoms improve - Flare up of any chronic lung problem, such as asthma - Your symptoms persist longer than 2 weeks

## 2023-07-06 ENCOUNTER — Other Ambulatory Visit: Payer: Self-pay | Admitting: Family Medicine

## 2023-08-03 ENCOUNTER — Other Ambulatory Visit: Payer: Self-pay | Admitting: Family Medicine

## 2023-08-13 ENCOUNTER — Ambulatory Visit: Payer: Self-pay | Admitting: Family Medicine

## 2023-08-13 NOTE — Telephone Encounter (Signed)
Copied from CRM [id number]. Topic: Clinical - Red Word Triage >> Aug 13, 2023 12:02 PM Antony Haste wrote: Red Word that prompted transfer to Nurse Triage: fell last night - fell out in the yard in the dark did not see object  fell on face - face hit into the ground, nose/neck pain  Chief Complaint: fall Symptoms: fell on face, "face planted" and injured nose.  Unsure if pain is sinus or if she fractured something.  Frequency: constant Pertinent Negatives: Patient denies fever, sob, loc during fall.  Disposition: [x] ED /[] Urgent Care (no appt availability in office) / [] Appointment(In office/virtual)/ []  [facility]/ [] Home Care/ [] Refused Recommended Disposition /[] [facility]/ []  Follow-up with PCP Additional Notes: Paient called with c/o fall yesterday evening where she tripped over an old roll of carpet and landed on her face injuring her nose.  Instructed to go to ER.  Patient states pain is bad but is unsure if it is sinus or if she fractured something.   Reason for Disposition  Injury (or injuries) that need emergency care  Answer Assessment - Initial Assessment Questions 1. MECHANISM: "How did the fall happen?"     Tripped over old carpet 2. DOMESTIC VIOLENCE AND ELDER ABUSE SCREENING: "Did you fall because someone pushed you or tried to hurt you?" If Yes, ask: "Are you safe now?"     NO 3. ONSET: "When did the fall happen?" (e.g., minutes, hours, or days ago)     Last night around 8 pm 4. LOCATION: "What part of the body hit the ground?" (e.g., back, buttocks, head, hips, knees, hands, head, stomach)     Face planted 5. INJURY: "Did you hurt (injure) yourself when you fell?" If Yes, ask: "What did you injure? Tell me more about this?" (e.g., body area; type of injury; pain severity)"     Nose, and bruise on inside of lip, neck and shoulders and lower back. 6. PAIN: "Is there any pain?" If Yes, ask: "How bad is the pain?" (e.g., Scale 1-10; or mild,  moderate,  severe)   - NONE (0): No pain   - MILD (1-3): Doesn't interfere with normal activities    - MODERATE (4-7): Interferes with normal activities or awakens from sleep    - SEVERE (8-10): Excruciating pain, unable to do any normal activities      2 9. OTHER SYMPTOMS: "Do you have any other symptoms?" (e.g., dizziness, fever, weakness; new onset or worsening).      "When I get up a little woozie."  Protocols used: Falls and [facility]

## 2023-08-16 ENCOUNTER — Telehealth: Payer: Self-pay | Admitting: Family Medicine

## 2023-08-16 NOTE — Telephone Encounter (Signed)
Copied from CRM [id number]. Topic: General - Other >> Aug 14, 2023 11:37 AM Gildardo Pounds wrote: Reason for CRM: Patient is upset on the scheduling. Dr Darlyn Read does not have any appointments available until 09/11/2022. Patient states that she will walk into the office to speak with someone about scheduling. Callback number is [phone].

## 2023-08-19 NOTE — Progress Notes (Deleted)
Cardiology Office Note    Patient Name: Kimberly Obrien Date of Encounter: 08/19/2023  Primary Care Provider:  Mechele Claude, MD Primary Cardiologist:  Verne Carrow, MD Primary Electrophysiologist: None   Past Medical History    Past Medical History:  Diagnosis Date   Anemia    Anticoagulation monitoring by pharmacist    Arthritis    Asthma    Blood transfusion without reported diagnosis    Cataract    removed both eyes   Cirrhosis (HCC)    COPD (chronic obstructive pulmonary disease) (HCC)    Diabetes mellitus without complication (HCC)    Dyspnea    exertion   Elevated hemoglobin A1c 03/01/2018   Family history of Hodgkin's lymphoma    Family history of leukemia    Family history of lung cancer    Family history of stomach cancer    Family history of uterine cancer    GERD (gastroesophageal reflux disease)    mild- diet related   Heart murmur    History of colon polyps    History of pulmonary embolism 02/04/2013   Hyperlipidemia    controlled   NASH (nonalcoholic steatohepatitis)    Neuromuscular disorder (HCC)    back surgery   PE (pulmonary embolism)    Routine Papanicolaou smear 09/12/2021   Sleep apnea    no cpap   Ureteral calculus, left 07/28/2021   UTI symptoms 08/18/2021    History of Present Illness  Kimberly Obrien is a 62 y.o. female with a PMH of diastolic dysfunction, recurrent PE  autoimmune hepatitis, tobacco abuse, COPD/asthma who presents today for 1 year follow-up.  Kimberly Obrien has been followed by Dr. Clifton James since 2017 and had previously been followed by Dr. Darl Householder in the [facility] office in [facility].  She was previously evaluated for tachycardia along with fatigue and palpitations with dyspnea.  She underwent R/LHC in 2015 that showed no evidence of CAD with normal PA pressures and also completed an event monitor in 2016 that showed rare PVCs.  She completed a CT of the chest in 2015 that showed no  evidence of PE as well.  She has a history of recurrent PEs diagnosed in 1999 with recurrence in 2003 that was treated with Coumadin.  She developed GI bleeding in 2018 and decision was made to stop Coumadin and patient is currently followed by hematology.  She has been followed by pulmonology for mild COPD.  She was last seen by Dr. Clifton James on 12/2021 when she noted fluttering that lasted around 15 seconds.  She underwent 2D echo in 01/2022 that showed normal EF with no valvular abnormalities.  She also completed an event monitor to rule out arrhythmias that showed 4 beats of SVT but no evidence of AF.  During today's visit the patient reports*** .  Patient denies chest pain, palpitations, dyspnea, PND, orthopnea, nausea, vomiting, dizziness, syncope, edema, weight gain, or early satiety.  ***Notes: -Last ischemic evaluation: -Last echo: -Interim ED visits: Review of Systems  Please see the history of present illness.    All other systems reviewed and are otherwise negative except as noted above.  Physical Exam    Wt Readings from Last 3 Encounters:  05/02/23 201 lb (91.2 kg)  12/15/22 200 lb (90.7 kg)  11/29/22 197 lb 9.6 oz (89.6 kg)   QM:VHQIO were no vitals filed for this visit.,There is no height or weight on file to calculate BMI. GEN: Well nourished, well developed in no acute distress Neck: No  JVD; No carotid bruits Pulmonary: Clear to auscultation without rales, wheezing or rhonchi  Cardiovascular: Normal rate. Regular rhythm. Normal S1. Normal S2.   Murmurs: There is no murmur.  ABDOMEN: Soft, non-tender, non-distended EXTREMITIES:  No edema; No deformity   EKG/LABS/ Recent Cardiac Studies   ECG personally reviewed by me today - ***  Risk Assessment/Calculations:   {Does this patient have ATRIAL FIBRILLATION?:[id number]}      Lab Results  Component Value Date   WBC 4.5 05/02/2023   HGB 14.4 05/02/2023   HCT 42.0 05/02/2023   MCV 87 05/02/2023   PLT 68 (LL)  05/02/2023   Lab Results  Component Value Date   CREATININE 0.54 (L) 05/02/2023   BUN 8 05/02/2023   NA 143 05/02/2023   K 3.6 05/02/2023   CL 106 05/02/2023   CO2 23 05/02/2023   Lab Results  Component Value Date   CHOL 147 05/02/2023   HDL 64 05/02/2023   LDLCALC 65 05/02/2023   TRIG 100 05/02/2023   CHOLHDL 2.3 05/02/2023    Lab Results  Component Value Date   HGBA1C 4.8 05/02/2023   Assessment & Plan    1.  Diastolic dysfunction:  2.  Palpitations:  3.  History of COPD:  4.  Murmur:      Disposition: Follow-up with Verne Carrow, MD or APP in *** months {Are you ordering a CV Procedure (e.g. stress test, cath, DCCV, TEE, etc)?   Press F2        :[id number]}   Signed, Napoleon Form, Leodis Rains, NP 08/19/2023, 1:01 PM [facility]

## 2023-08-20 ENCOUNTER — Other Ambulatory Visit: Payer: Self-pay | Admitting: Family Medicine

## 2023-08-20 ENCOUNTER — Ambulatory Visit: Payer: Medicare Other | Admitting: Nurse Practitioner

## 2023-08-20 DIAGNOSIS — Z1231 Encounter for screening mammogram for malignant neoplasm of breast: Secondary | ICD-10-CM

## 2023-08-20 DIAGNOSIS — R011 Cardiac murmur, unspecified: Secondary | ICD-10-CM

## 2023-08-20 DIAGNOSIS — J432 Centrilobular emphysema: Secondary | ICD-10-CM

## 2023-08-20 DIAGNOSIS — R002 Palpitations: Secondary | ICD-10-CM

## 2023-08-20 DIAGNOSIS — I5189 Other ill-defined heart diseases: Secondary | ICD-10-CM

## 2023-08-23 NOTE — Progress Notes (Signed)
 Attempted to reach patient regarding follow-up LDCT. Patient unable to discuss at this time and requests a call back at a later date.

## 2023-08-29 ENCOUNTER — Other Ambulatory Visit: Payer: Self-pay

## 2023-08-29 DIAGNOSIS — Z87891 Personal history of nicotine dependence: Secondary | ICD-10-CM

## 2023-08-29 DIAGNOSIS — Z122 Encounter for screening for malignant neoplasm of respiratory organs: Secondary | ICD-10-CM

## 2023-08-31 ENCOUNTER — Other Ambulatory Visit: Payer: Self-pay | Admitting: Family Medicine

## 2023-09-05 ENCOUNTER — Ambulatory Visit (INDEPENDENT_AMBULATORY_CARE_PROVIDER_SITE_OTHER): Payer: Medicare Other | Admitting: Family Medicine

## 2023-09-05 ENCOUNTER — Encounter: Payer: Self-pay | Admitting: Family Medicine

## 2023-09-05 VITALS — BP 114/61 | HR 87 | Temp 97.4°F | Ht 64.0 in | Wt 200.0 lb

## 2023-09-05 DIAGNOSIS — E756 Lipid storage disorder, unspecified: Secondary | ICD-10-CM

## 2023-09-05 DIAGNOSIS — E785 Hyperlipidemia, unspecified: Secondary | ICD-10-CM

## 2023-09-05 DIAGNOSIS — E119 Type 2 diabetes mellitus without complications: Secondary | ICD-10-CM

## 2023-09-05 DIAGNOSIS — E13620 Other specified diabetes mellitus with diabetic dermatitis: Secondary | ICD-10-CM

## 2023-09-05 LAB — BAYER DCA HB A1C WAIVED: HB A1C (BAYER DCA - WAIVED): 5 % (ref 4.8–5.6)

## 2023-09-05 MED ORDER — TERBINAFINE HCL 1 % EX CREA: 1.0000 | TOPICAL_CREAM | Freq: Two times a day (BID) | CUTANEOUS | 0 refills | Status: DC

## 2023-09-05 NOTE — Progress Notes (Addendum)
Subjective:  Patient ID: Kimberly Obrien,  female    DOB: 1961/05/15  Age: 63 y.o.    CC: Medical Management of Chronic Issues ([place] additional refills/Sample?)   HPI Kimberly Obrien presents for  follow-up of hypertension. Patient has no history of headache chest pain or shortness of breath or recent cough. Patient also denies symptoms of TIA such as numbness weakness lateralizing. Patient denies side effects from medication. States taking it regularly.  Patient also  in for follow-up of elevated cholesterol. Doing well without complaints on current medication. Denies side effects  including myalgia and arthralgia and nausea. Also in today for liver function testing. Currently no chest pain, shortness of breath or other cardiovascular related symptoms noted.  Follow-up of diabetes. Patient does check blood sugar at home. Readings run between 110 and 120 fasting Patient denies symptoms such as excessive hunger or urinary frequency, excessive hunger, nausea No significant hypoglycemic spells noted. Medications reviewed. Pt reports taking them regularly. Pt. denies complication/adverse reaction today.    History Kimberly Obrien has a past medical history of Anemia, Anticoagulation monitoring by pharmacist, Arthritis, Asthma, Blood transfusion without reported diagnosis, Cataract, Cirrhosis (HCC), COPD (chronic obstructive pulmonary disease) (HCC), Diabetes mellitus without complication (HCC), Dyspnea, Elevated hemoglobin A1c (03/01/2018), Family history of Hodgkin's lymphoma, Family history of leukemia, Family history of lung cancer, Family history of stomach cancer, Family history of uterine cancer, GERD (gastroesophageal reflux disease), Heart murmur, History of colon polyps, History of pulmonary embolism (02/04/2013), Hyperlipidemia, NASH (nonalcoholic steatohepatitis), Neuromuscular disorder (HCC), PE (pulmonary embolism), Routine Papanicolaou smear (09/12/2021), Sleep apnea, Ureteral  calculus, left (07/28/2021), and UTI symptoms (08/18/2021).   She has a past surgical history that includes Tubal ligation; Wrist surgery (Right); Cholecystectomy; Spine surgery; Abdominal hysterectomy (2009); left and right heart catheterization with coronary angiogram (N/A, 05/13/2014); Incontinence surgery (2009); Cataract extraction, bilateral (Bilateral); Esophagogastroduodenoscopy (egd) with propofol (N/A, 05/07/2017); Colonoscopy with propofol (N/A, 05/07/2017); IR Venogram Hepatic W Hemodynamic Evaluation (03/15/2018); IR Transcatheter BX (03/15/2018); and Breast biopsy (Left).   Her family history includes Bipolar disorder in her father; Cancer in her cousin, maternal uncle, paternal aunt, paternal aunt, and paternal uncle; Cirrhosis in her mother; Colon polyps in her sister; Endometrial cancer (age of onset: 64) in her daughter; Heart disease in her father; Heart failure in her father and paternal grandfather; Hodgkin's lymphoma (age of onset: 38) in an other family member; Hyperlipidemia in her father; Hypertension in her brother; Irritable bowel syndrome in her mother and sister; Leukemia in her cousin; Lung cancer in her maternal uncle; Obesity in her daughter; Stomach cancer in her maternal uncle.She reports that she quit smoking about 11 years ago. Her smoking use included cigarettes. She started smoking about 46 years ago. She has a 99.9 pack-year smoking history. She has never used smokeless tobacco. She reports that she does not drink alcohol and does not use drugs.  Current Outpatient Medications on File Prior to Visit  Medication Sig Dispense Refill   acetaminophen (TYLENOL) 500 MG tablet Take 500 mg by mouth every 6 (six) hours as needed.     albuterol (VENTOLIN HFA) 108 (90 Base) MCG/ACT inhaler Inhale 2 puffs into the lungs every 6 (six) hours as needed for wheezing or shortness of breath. 18 g 0   Blood Glucose Monitoring Suppl (ONETOUCH VERIO REFLECT) w/Device KIT Test BS twice  daily Dx e11.9 1 kit 0   econazole nitrate 1 % cream Apply topically 2 (two) times daily. To affected areas 30 g 5  empagliflozin (JARDIANCE) 10 MG TABS tablet Take 1 tablet (10 mg total) by mouth daily. 90 tablet 0   fluocinonide-emollient (LIDEX-E) 0.05 % cream Apply 1 Application topically 2 (two) times daily. 60 g 2   fluticasone (FLONASE) 50 MCG/ACT nasal spray Place 2 sprays into both nostrils daily. 48 g 1   glucose blood (ONETOUCH VERIO) test strip Test BS twice daily Dx e11.9 200 strip 3   ipratropium-albuterol (DUONEB) 0.5-2.5 (3) MG/3ML SOLN Take 3 mLs by nebulization every 6 (six) hours as needed. 360 mL 1   Lancets (ONETOUCH DELICA PLUS LANCET33G) MISC Test BS twice daily Dx e11.9 200 each 3   carvedilol (COREG) 3.125 MG tablet Take 1 tablet (3.125 mg total) by mouth 2 (two) times daily with a meal. (Patient not taking: Reported on 09/05/2023) 60 tablet 3   No current facility-administered medications on file prior to visit.    ROS Review of Systems  Constitutional: Negative.   HENT: Negative.    Eyes:  Negative for visual disturbance.  Respiratory:  Negative for shortness of breath.   Cardiovascular:  Negative for chest pain.  Gastrointestinal:  Negative for abdominal pain.  Musculoskeletal:  Negative for arthralgias.    Objective:  BP 114/61   Pulse 87   Temp (!) 97.4 F (36.3 C) (Temporal)   Ht 5\' 4"  (1.626 m)   Wt 200 lb (90.7 kg)   SpO2 93%   BMI 34.33 kg/m   BP Readings from Last 3 Encounters:  09/05/23 114/61  05/02/23 115/60  12/15/22 (!) 115/46    Wt Readings from Last 3 Encounters:  09/05/23 200 lb (90.7 kg)  05/02/23 201 lb (91.2 kg)  12/15/22 200 lb (90.7 kg)     Physical Exam Constitutional:      General: She is not in acute distress.    Appearance: She is well-developed.  Cardiovascular:     Rate and Rhythm: Normal rate and regular rhythm.  Pulmonary:     Breath sounds: Normal breath sounds.  Musculoskeletal:        General: Normal  range of motion.  Skin:    General: Skin is warm and dry.  Neurological:     Mental Status: She is alert and oriented to person, place, and time.     Diabetic Foot Exam - Simple   No data filed     Lab Results  Component Value Date   HGBA1C 5.0 09/05/2023   HGBA1C 4.8 05/02/2023   HGBA1C 5.2 05/30/2022    Assessment & Plan:   Breshae was seen today for medical management of chronic issues.  Diagnoses and all orders for this visit:  Diabetic lipidosis (HCC) -     Bayer DCA Hb A1c Waived -     CBC with Differential/Platelet -     CMP14+EGFR  Hyperlipidemia, unspecified hyperlipidemia type -     Lipid panel  Other orders -     terbinafine (LAMISIL) 1 % cream; Apply 1 Application topically 2 (two) times daily.   I have discontinued Cheney B. Carby's azithromycin and methylPREDNISolone. I am also having her start on terbinafine. Additionally, I am having her maintain her acetaminophen, OneTouch Verio Reflect, albuterol, ipratropium-albuterol, fluticasone, carvedilol, fluocinonide-emollient, econazole nitrate, OneTouch Verio, OneTouch Delica Plus Lancet33G, and empagliflozin.  Meds ordered this encounter  Medications   terbinafine (LAMISIL) 1 % cream    Sig: Apply 1 Application topically 2 (two) times daily.    Dispense:  30 g    Refill:  0  Follow-up: Return in about 3 months (around 12/04/2023).  Mechele Claude, M.D.

## 2023-09-07 ENCOUNTER — Encounter: Payer: Self-pay | Admitting: Family Medicine

## 2023-09-07 LAB — LIPID PANEL
Chol/HDL Ratio: 2.6 {ratio} (ref 0.0–4.4)
Cholesterol, Total: 148 mg/dL (ref 100–199)
HDL: 57 mg/dL (ref >39)
LDL Chol Calc (NIH): 70 mg/dL (ref 0–99)
Triglycerides: 116 mg/dL (ref 0–149)
VLDL Cholesterol Cal: 21 mg/dL (ref 5–40)

## 2023-09-07 LAB — CBC WITH DIFFERENTIAL/PLATELET
Basophils Absolute: 0 10*3/uL (ref 0.0–0.2)
Basos: 1 %
EOS (ABSOLUTE): 0 10*3/uL (ref 0.0–0.4)
Eos: 1 %
Hematocrit: 44.7 % (ref 34.0–46.6)
Hemoglobin: 15.2 g/dL (ref 11.1–15.9)
Immature Grans (Abs): 0 10*3/uL (ref 0.0–0.1)
Immature Granulocytes: 1 %
Lymphocytes Absolute: 0.8 10*3/uL (ref 0.7–3.1)
Lymphs: 19 %
MCH: 29.9 pg (ref 26.6–33.0)
MCHC: 34 g/dL (ref 31.5–35.7)
MCV: 88 fL (ref 79–97)
Monocytes Absolute: 0.2 10*3/uL (ref 0.1–0.9)
Monocytes: 5 %
Neutrophils Absolute: 3.3 10*3/uL (ref 1.4–7.0)
Neutrophils: 73 %
Platelets: 80 10*3/uL — CL (ref 150–450)
RBC: 5.09 x10E6/uL (ref 3.77–5.28)
RDW: 13.4 % (ref 11.7–15.4)
WBC: 4.4 10*3/uL (ref 3.4–10.8)

## 2023-09-07 LAB — CMP14+EGFR
ALT: 26 [IU]/L (ref 0–32)
AST: 41 [IU]/L — ABNORMAL HIGH (ref 0–40)
Albumin: 3.7 g/dL — ABNORMAL LOW (ref 3.9–4.9)
Alkaline Phosphatase: 97 [IU]/L (ref 44–121)
BUN/Creatinine Ratio: 15 (ref 12–28)
BUN: 9 mg/dL (ref 8–27)
Bilirubin Total: 2.9 mg/dL — ABNORMAL HIGH (ref 0.0–1.2)
CO2: 20 mmol/L (ref 20–29)
Calcium: 9.3 mg/dL (ref 8.7–10.3)
Chloride: 108 mmol/L — ABNORMAL HIGH (ref 96–106)
Creatinine, Ser: 0.6 mg/dL (ref 0.57–1.00)
Globulin, Total: 2.1 g/dL (ref 1.5–4.5)
Glucose: 182 mg/dL — ABNORMAL HIGH (ref 70–99)
Potassium: 3.8 mmol/L (ref 3.5–5.2)
Sodium: 146 mmol/L — ABNORMAL HIGH (ref 134–144)
Total Protein: 5.8 g/dL — ABNORMAL LOW (ref 6.0–8.5)
eGFR: 101 mL/min/{1.73_m2} (ref >59)

## 2023-09-07 NOTE — Progress Notes (Signed)
Hello Kimberly Obrien, ? ?Your lab result is normal and/or stable.Some minor variations that are not significant are commonly marked abnormal, but do not represent any medical problem for you. ? ?Best regards, ?Yader Criger, M.D.

## 2023-10-17 ENCOUNTER — Ambulatory Visit (INDEPENDENT_AMBULATORY_CARE_PROVIDER_SITE_OTHER): Payer: Medicare Other

## 2023-10-17 ENCOUNTER — Ambulatory Visit
Admission: RE | Admit: 2023-10-17 | Discharge: 2023-10-17 | Disposition: A | Discharge status: Home / Self Care | Payer: Medicare Other | Source: Ambulatory Visit | Attending: Family Medicine | Admitting: Family Medicine

## 2023-10-17 DIAGNOSIS — Z1231 Encounter for screening mammogram for malignant neoplasm of breast: Secondary | ICD-10-CM

## 2023-10-17 DIAGNOSIS — Z780 Asymptomatic menopausal state: Secondary | ICD-10-CM | POA: Diagnosis not present

## 2023-10-18 ENCOUNTER — Ambulatory Visit: Payer: Medicare Other

## 2023-10-18 VITALS — Ht 64.0 in | Wt 200.0 lb

## 2023-10-18 DIAGNOSIS — Z0000 Encounter for general adult medical examination without abnormal findings: Secondary | ICD-10-CM

## 2023-10-18 DIAGNOSIS — Z780 Asymptomatic menopausal state: Secondary | ICD-10-CM | POA: Diagnosis not present

## 2023-10-18 DIAGNOSIS — M85852 Other specified disorders of bone density and structure, left thigh: Secondary | ICD-10-CM | POA: Diagnosis not present

## 2023-10-18 NOTE — Patient Instructions (Signed)
 [name] , Thank you for taking time to come for your Medicare Wellness Visit. I appreciate your ongoing commitment to your health goals. Please review the following plan we discussed and let me know if I can assist you in the future.   Referrals/Orders/Follow-Ups/Clinician Recommendations: Aim for 30 minutes of exercise or brisk walking, 6-8 glasses of water, and 5 servings of fruits and vegetables each day.  This is a list of the screening recommended for you and due dates:  Health Maintenance  Topic Date Due   COVID-19 Vaccine (1) Never done   DEXA scan (bone density measurement)  11/19/2022   Mammogram  08/29/2023   Screening for Lung Cancer  09/20/2023   Flu Shot  11/19/2023*   Zoster (Shingles) Vaccine (1 of 2) 12/04/2023*   Pneumococcal Vaccination (1 of 2 - PCV) 09/04/2024*   Yearly kidney health urinalysis for diabetes  05/01/2024   Pap with HPV screening  05/31/2024   Yearly kidney function blood test for diabetes  09/04/2024   Colon Cancer Screening  09/06/2024   Medicare Annual Wellness Visit  10/17/2024   DTaP/Tdap/Td vaccine (3 - Td or Tdap) 04/21/2027   Hepatitis C Screening  Completed   HIV Screening  Completed   HPV Vaccine  Aged Out  *Topic was postponed. The date shown is not the original due date.    Advanced directives: (ACP Link)Information on Advanced Care Planning can be found at [facility] [place] Advance Health Care Directives Advance Health Care Directives ([email or web address]   Next Medicare Annual Wellness Visit scheduled for next year: Yes

## 2023-10-18 NOTE — Progress Notes (Signed)
 Subjective:   Kimberly Obrien is a 63 y.o. who presents for a Medicare Wellness preventive visit.  Visit Complete: Virtual I connected with  Kimberly Obrien on 10/18/23 by a audio enabled telemedicine application and verified that I am speaking with the correct person using two identifiers.  Patient Location: Home  Provider Location: Home Office  I discussed the limitations of evaluation and management by telemedicine. The patient expressed understanding and agreed to proceed.  Vital Signs: Because this visit was a virtual/telehealth visit, some criteria may be missing or patient reported. Any vitals not documented were not able to be obtained and vitals that have been documented are patient reported.  VideoDeclined- This patient declined Librarian, academic. Therefore the visit was completed with audio only.  AWV Questionnaire: No: Patient Medicare AWV questionnaire was not completed prior to this visit.  Cardiac Risk Factors include: advanced age (>30men, >39 women);diabetes mellitus;hypertension;dyslipidemia     Objective:    Today's Vitals   10/18/23 1049  Weight: 200 lb (90.7 kg)  Height: 5\' 4"  (1.626 m)   Body mass index is 34.33 kg/m.     10/18/2023   10:55 AM 10/16/2022    8:50 AM 09/29/2021    2:49 PM 06/24/2021    2:11 PM 06/23/2021    6:23 AM 09/28/2020    2:36 PM 09/25/2019    1:56 PM  Advanced Directives  Does Patient Have a Medical Advance Directive? No No No No No No No  Would patient like information on creating a medical advance directive? Yes (MAU/Ambulatory/Procedural Areas - Information given) No - Patient declined No - Patient declined No - Patient declined No - Patient declined No - Patient declined No - Patient declined    Current Medications (verified) Outpatient Encounter Medications as of 10/18/2023  Medication Sig   acetaminophen (TYLENOL) 500 MG tablet Take 500 mg by mouth every 6 (six) hours as needed.    albuterol (VENTOLIN HFA) 108 (90 Base) MCG/ACT inhaler Inhale 2 puffs into the lungs every 6 (six) hours as needed for wheezing or shortness of breath.   Blood Glucose Monitoring Suppl (ONETOUCH VERIO REFLECT) w/Device KIT Test BS twice daily Dx e11.9   econazole nitrate 1 % cream Apply topically 2 (two) times daily. To affected areas   empagliflozin (JARDIANCE) 10 MG TABS tablet Take 1 tablet (10 mg total) by mouth daily.   fluocinonide-emollient (LIDEX-E) 0.05 % cream Apply 1 Application topically 2 (two) times daily.   fluticasone (FLONASE) 50 MCG/ACT nasal spray Place 2 sprays into both nostrils daily.   glucose blood (ONETOUCH VERIO) test strip Test BS twice daily Dx e11.9   ipratropium-albuterol (DUONEB) 0.5-2.5 (3) MG/3ML SOLN Take 3 mLs by nebulization every 6 (six) hours as needed.   Lancets (ONETOUCH DELICA PLUS LANCET33G) MISC Test BS twice daily Dx e11.9   terbinafine (LAMISIL) 1 % cream Apply 1 Application topically 2 (two) times daily.   carvedilol (COREG) 3.125 MG tablet Take 1 tablet (3.125 mg total) by mouth 2 (two) times daily with a meal. (Patient not taking: Reported on 10/18/2023)   No facility-administered encounter medications on file as of 10/18/2023.    Allergies (verified) Aciphex [rabeprazole sodium], Fluconazole, Lansoprazole, Septra [sulfamethoxazole-trimethoprim], Sucralfate, Macrobid [nitrofurantoin], Vancomycin, Claritin [loratadine], Crestor [rosuvastatin], Doxycycline, Ibuprofen, Lipitor [atorvastatin], Metoprolol, Metronidazole, Nsaids, Omeprazole, and Rabeprazole   History: Past Medical History:  Diagnosis Date   Anemia    Anticoagulation monitoring by pharmacist    Arthritis    Asthma  Blood transfusion without reported diagnosis    Cataract    removed both eyes   Cirrhosis (HCC)    COPD (chronic obstructive pulmonary disease) (HCC)    Diabetes mellitus without complication (HCC)    Dyspnea    exertion   Elevated hemoglobin A1c 03/01/2018    Family history of Hodgkin's lymphoma    Family history of leukemia    Family history of lung cancer    Family history of stomach cancer    Family history of uterine cancer    GERD (gastroesophageal reflux disease)    mild- diet related   Heart murmur    History of colon polyps    History of pulmonary embolism 02/04/2013   Hyperlipidemia    controlled   NASH (nonalcoholic steatohepatitis)    Neuromuscular disorder (HCC)    back surgery   PE (pulmonary embolism)    Routine Papanicolaou smear 09/12/2021   Sleep apnea    no cpap   Ureteral calculus, left 07/28/2021   UTI symptoms 08/18/2021   Past Surgical History:  Procedure Laterality Date   ABDOMINAL HYSTERECTOMY  2009   BREAST BIOPSY Left    CATARACT EXTRACTION, BILATERAL Bilateral    CHOLECYSTECTOMY     COLONOSCOPY WITH PROPOFOL N/A 05/07/2017   Procedure: COLONOSCOPY WITH PROPOFOL;  Surgeon: Carman Ching, MD;  Location: [facility];  Service: Endoscopy;  Laterality: N/A;   ESOPHAGOGASTRODUODENOSCOPY (EGD) WITH PROPOFOL N/A 05/07/2017   Procedure: ESOPHAGOGASTRODUODENOSCOPY (EGD) WITH PROPOFOL;  Surgeon: Carman Ching, MD;  Location: [facility];  Service: Endoscopy;  Laterality: N/A;   INCONTINENCE SURGERY  2009   with hysterectomy   IR TRANSCATHETER BX  03/15/2018   IR VENOGRAM HEPATIC W HEMODYNAMIC EVALUATION  03/15/2018   LEFT AND RIGHT HEART CATHETERIZATION WITH CORONARY ANGIOGRAM N/A 05/13/2014   Procedure: LEFT AND RIGHT HEART CATHETERIZATION WITH CORONARY ANGIOGRAM;  Surgeon: Kathleene Hazel, MD;  Location: [facility] CATH LAB;  Service: Cardiovascular;  Laterality: N/A;   SPINE SURGERY     L5 S1 fusion x4   TUBAL LIGATION     WRIST SURGERY Right    Had surgery twice to shave bone for blood circulation improvement.   Family History  Problem Relation Age of Onset   Cirrhosis Mother        NASH   Irritable bowel syndrome Mother    Hyperlipidemia Father    Heart failure Father    Bipolar disorder Father     Heart disease Father    Colon polyps Sister        unknown number but fewer than patient   Irritable bowel syndrome Sister    Obesity Daughter    Endometrial cancer Daughter 33   Stomach cancer Maternal Uncle        dx 40s/50s   Lung cancer Maternal Uncle        dx 40s/50s, smoker   Cancer Maternal Uncle        NOS   Cancer Paternal Aunt        NOS   Cancer Paternal Aunt        NOS   Cancer Paternal Uncle        NOS   Heart failure Paternal Grandfather    Leukemia Cousin        dx 48s, died 2 weeks after diagnosis (maternal first cousin)   Cancer Cousin        NOS (paternal first cousin)   Hypertension Brother    Hodgkin's lymphoma Other 14  4th degree relative (mother's half-sibling's son)   Heart attack Neg Hx    Miscarriages / Stillbirths Neg Hx    Colon cancer Neg Hx    Esophageal cancer Neg Hx    Rectal cancer Neg Hx    Breast cancer Neg Hx    Social History   Socioeconomic History   Marital status: Married    Spouse name: Onalee Hua   Number of children: 4   Years of education: GED   Highest education level: GED or equivalent  Occupational History   Occupation: Unemployed  Tobacco Use   Smoking status: Former    Current packs/day: 0.00    Average packs/day: 2.0 packs/day for 50.0 years (99.9 ttl pk-yrs)    Types: Cigarettes    Start date: 08/22/1977    Quit date: 04/21/2012    Years since quitting: 11.4   Smokeless tobacco: Never  Vaping Use   Vaping status: Never Used  Substance and Sexual Activity   Alcohol use: No   Drug use: No   Sexual activity: Not Currently    Birth control/protection: Surgical    Comment: tubal and [facility]  Other Topics Concern   Not on file  Social History Narrative   Not on file   Social Drivers of Health   Financial Resource Strain: Low Risk  (10/18/2023)   Overall Financial Resource Strain (CARDIA)    Difficulty of Paying Living Expenses: Not hard at all  Food Insecurity: No Food Insecurity (10/18/2023)   Hunger Vital  Sign    Worried About Running Out of Food in the Last Year: Never true    Ran Out of Food in the Last Year: Never true  Transportation Needs: No Transportation Needs (10/18/2023)   PRAPARE - Administrator, Civil Service (Medical): No    Lack of Transportation (Non-Medical): No  Physical Activity: Sufficiently Active (10/18/2023)   Exercise Vital Sign    Days of Exercise per Week: 5 days    Minutes of Exercise per Session: 30 min  Stress: No Stress Concern Present (10/18/2023)   [employer] of Occupational Health - Occupational Stress Questionnaire    Feeling of Stress : Not at all  Social Connections: Moderately Isolated (10/18/2023)   Social Connection and Isolation Panel [NHANES]    Frequency of Communication with Friends and Family: More than three times a week    Frequency of Social Gatherings with Friends and Family: Three times a week    Attends Religious Services: Never    Active Member of Clubs or Organizations: No    Attends Engineer, structural: Never    Marital Status: Married    Tobacco Counseling Counseling given: Not Answered    Clinical Intake:  Pre-visit preparation completed: Yes  Pain : No/denies pain     Diabetes: No  How often do you need to have someone help you when you read instructions, pamphlets, or other written materials from your doctor or pharmacy?: 1 - Never  Interpreter Needed?: No  Information entered by :: Kandis Fantasia LPN   Activities of Daily Living     10/18/2023   10:55 AM  In your present state of health, do you have any difficulty performing the following activities:  Hearing? 0  Vision? 0  Difficulty concentrating or making decisions? 0  Walking or climbing stairs? 0  Dressing or bathing? 0  Doing errands, shopping? 0  Preparing Food and eating ? N  Using the Toilet? N  In the past six months, have  you accidently leaked urine? N  Do you have problems with loss of bowel control? N  Managing  your Medications? N  Managing your Finances? N  Housekeeping or managing your Housekeeping? N    Patient Care Team: Mechele Claude, MD as PCP - General (Family Medicine) Kathleene Hazel, MD as PCP - Cardiology (Cardiology) Kathleene Hazel, MD as Consulting Physician (Cardiology) Adline Potter, NP as Nurse Practitioner (Obstetrics and Gynecology) Armbruster, Willaim Rayas, MD as Consulting Physician (Gastroenterology) Pete Glatter, Danford Bad., MD as Referring Physician (Urology)  Indicate any recent Medical Services you may have received from other than [facility] providers in the past year (date may be approximate).     Assessment:   This is a routine wellness examination for Janara.  Hearing/Vision screen Hearing Screening - Comments:: Denies hearing difficulties   Vision Screening - Comments:: Wears rx glasses - up to date with routine eye exams with [facility] [place]     Goals Addressed   None    Depression Screen     10/18/2023   10:53 AM 09/05/2023   11:27 AM 09/05/2023   11:14 AM 05/02/2023    3:01 PM 11/29/2022    4:07 PM 11/29/2022    3:11 PM 11/16/2022    4:06 PM  PHQ 2/9 Scores  PHQ - 2 Score 0 0 0 2 1 0 1  PHQ- 9 Score 6  9 11 11  10     Fall Risk     10/18/2023   10:54 AM 09/05/2023   11:27 AM 11/29/2022    3:11 PM 10/16/2022    8:47 AM 05/30/2022    3:45 PM  Fall Risk   Falls in the past year? 0 0 0 0 0  Number falls in past yr: 0   0   Injury with Fall? 0   0   Risk for fall due to : No Fall Risks   No Fall Risks   Follow up Falls prevention discussed;Education provided;Falls evaluation completed   Falls prevention discussed     MEDICARE RISK AT HOME:  Medicare Risk at Home Any stairs in or around the home?: No If so, are there any without handrails?: No Home free of loose throw rugs in walkways, pet beds, electrical cords, etc?: Yes Adequate lighting in your home to reduce risk of falls?: Yes Life alert?: No Use of a cane, walker or w/c?:  No Grab bars in the bathroom?: Yes Shower chair or bench in shower?: No Elevated toilet seat or a handicapped toilet?: Yes  TIMED UP AND GO:  Was the test performed?  No  Cognitive Function: 6CIT completed    12/20/2017   12:18 PM 08/09/2016   11:43 AM 07/19/2015    4:06 PM  MMSE - Mini Mental State Exam  Orientation to time 5 5 5   Orientation to Place 5 5 5   Registration 3 3 3   Attention/ Calculation 5 5 5   Recall 3 3 3   Language- name 2 objects 2 2 2   Language- repeat 1 1 1   Language- follow 3 step command 3 3 3   Language- read & follow direction 1 1 1   Write a sentence 1 1 1   Copy design 1 1 1   Total score 30 30 30         10/18/2023   10:55 AM 10/16/2022    8:51 AM 09/28/2020    2:40 PM 09/25/2019    2:01 PM  6CIT Screen  What Year? 0 points 0 points 0 points 0  points  What month? 0 points 0 points 0 points 0 points  What time? 0 points 0 points 0 points 0 points  Count back from 20 0 points 0 points 0 points 0 points  Months in reverse 0 points 0 points 0 points 0 points  Repeat phrase 0 points 0 points 0 points 0 points  Total Score 0 points 0 points 0 points 0 points    Immunizations Immunization History  Administered Date(s) Administered   Tdap 04/21/2011, 04/20/2017    Screening Tests Health Maintenance  Topic Date Due   COVID-19 Vaccine (1) Never done   DEXA SCAN  11/19/2022   MAMMOGRAM  08/29/2023   Lung Cancer Screening  09/20/2023   INFLUENZA VACCINE  11/19/2023 (Originally 03/22/2023)   Zoster Vaccines- Shingrix (1 of 2) 12/04/2023 (Originally 08/23/1979)   Pneumococcal Vaccine 59-63 Years old (1 of 2 - PCV) 09/04/2024 (Originally 08/22/1966)   Diabetic kidney evaluation - Urine ACR  05/01/2024   Cervical Cancer Screening (HPV/Pap Cotest)  05/31/2024   Diabetic kidney evaluation - eGFR measurement  09/04/2024   Colonoscopy  09/06/2024   Medicare Annual Wellness (AWV)  10/17/2024   DTaP/Tdap/Td (3 - Td or Tdap) 04/21/2027   Hepatitis C Screening   Completed   HIV Screening  Completed   HPV VACCINES  Aged Out    Health Maintenance  Health Maintenance Due  Topic Date Due   COVID-19 Vaccine (1) Never done   DEXA SCAN  11/19/2022   MAMMOGRAM  08/29/2023   Lung Cancer Screening  09/20/2023   Health Maintenance Items Addressed: Mammogram and dexa done 10/17/23; lung cancer scan scheduled for 10/26/23  Additional Screening:  Vision Screening: Recommended annual ophthalmology exams for early detection of glaucoma and other disorders of the eye.  Dental Screening: Recommended annual dental exams for proper oral hygiene  Community Resource Referral / Chronic Care Management: CRR required this visit?  No   CCM required this visit?  No     Plan:     I have personally reviewed and noted the following in the patient's chart:   Medical and social history Use of alcohol, tobacco or illicit drugs  Current medications and supplements including opioid prescriptions. Patient is not currently taking opioid prescriptions. Functional ability and status Nutritional status Physical activity Advanced directives List of other physicians Hospitalizations, surgeries, and ER visits in previous 12 months Vitals Screenings to include cognitive, depression, and falls Referrals and appointments  In addition, I have reviewed and discussed with patient certain preventive protocols, quality metrics, and best practice recommendations. A written personalized care plan for preventive services as well as general preventive health recommendations were provided to patient.     Kandis Fantasia [place], [place]   1[birth year]60   After Visit Summary: (MyChart) Due to this being a telephonic visit, the after visit summary with patients personalized plan was offered to patient via MyChart   Notes: Nothing significant to report at this time.

## 2023-10-19 ENCOUNTER — Ambulatory Visit: Payer: Medicare Other | Admitting: Cardiology

## 2023-10-19 ENCOUNTER — Encounter: Payer: Self-pay | Admitting: Family Medicine

## 2023-10-19 ENCOUNTER — Telehealth: Payer: Self-pay | Admitting: Gastroenterology

## 2023-10-19 ENCOUNTER — Telehealth: Payer: Self-pay | Admitting: Family Medicine

## 2023-10-19 DIAGNOSIS — K7460 Unspecified cirrhosis of liver: Secondary | ICD-10-CM

## 2023-10-19 NOTE — Telephone Encounter (Signed)
 Copied from CRM [id number]. Topic: Clinical - Lab/Test Results >> Oct 19, 2023  4:25 PM Hamdi H wrote: Reason for CRM: Patient was speaking with a nurse but the call dropped. It was about her test results and getting a rx sent for her.

## 2023-10-19 NOTE — Telephone Encounter (Signed)
 Patient called and stated that she would like to know if she is needing a follow up appointment for her 6 month liver check. Patient is requesting a call back. Please advise.

## 2023-10-19 NOTE — Progress Notes (Signed)
DEXA shows osteopenia. I recommend weekly fosamax. ?Nurse, if pt. Is agreeable, send in Fosamax 70 mg weekly, #13. ? ?Thanks, ?WS ?

## 2023-10-22 ENCOUNTER — Other Ambulatory Visit: Payer: Self-pay

## 2023-10-22 DIAGNOSIS — M8580 Other specified disorders of bone density and structure, unspecified site: Secondary | ICD-10-CM

## 2023-10-22 MED ORDER — ALENDRONATE SODIUM 70 MG PO TABS
70.0000 mg | ORAL_TABLET | ORAL | 0 refills | Status: DC
Start: 2023-10-22 — End: 2024-01-10

## 2023-10-22 NOTE — Telephone Encounter (Signed)
 Thanks [employer]. Yes I see she just had CBC and c-Met done in January.  She needs an INR and AFP at this time and await her ultrasound.  She is also overdue for routine office visit if you can help coordinate that for her.  Thanks

## 2023-10-22 NOTE — Telephone Encounter (Signed)
 Dr. Adela Lank, would you like to order updated labs on patient? Last AFP, PT/INR was 06/2022. CMET last ordered by PCP on 1/15 (in Epic). Patient is scheduled for her routine RUQ [place] on 10/26/23 at 12:30 pm. Please advise, thanks.

## 2023-10-22 NOTE — Telephone Encounter (Signed)
 Spoke to pt and medication called in for pt.

## 2023-10-22 NOTE — Telephone Encounter (Signed)
 Called and provided patient with recommendations. Pt requested a Friday afternoon follow up appt, appt is scheduled for 12/21/23 at 2:30 pm. Pt will stop by at her convenience to have labs completed, she is aware that no appt is necessary for labs.

## 2023-10-26 ENCOUNTER — Ambulatory Visit (HOSPITAL_COMMUNITY)
Admission: RE | Admit: 2023-10-26 | Discharge: 2023-10-26 | Disposition: A | Discharge status: Home / Self Care | Payer: Medicare Other | Source: Ambulatory Visit | Attending: Physician Assistant | Admitting: Physician Assistant

## 2023-10-26 ENCOUNTER — Ambulatory Visit (HOSPITAL_COMMUNITY)
Admission: RE | Admit: 2023-10-26 | Discharge: 2023-10-26 | Disposition: A | Discharge status: Home / Self Care | Payer: Medicare Other | Source: Ambulatory Visit | Attending: Gastroenterology | Admitting: Gastroenterology

## 2023-10-26 DIAGNOSIS — K7460 Unspecified cirrhosis of liver: Secondary | ICD-10-CM | POA: Diagnosis not present

## 2023-10-26 DIAGNOSIS — Z87891 Personal history of nicotine dependence: Secondary | ICD-10-CM | POA: Insufficient documentation

## 2023-10-26 DIAGNOSIS — K7689 Other specified diseases of liver: Secondary | ICD-10-CM | POA: Diagnosis not present

## 2023-10-26 DIAGNOSIS — Z122 Encounter for screening for malignant neoplasm of respiratory organs: Secondary | ICD-10-CM | POA: Diagnosis not present

## 2023-10-26 DIAGNOSIS — Z9049 Acquired absence of other specified parts of digestive tract: Secondary | ICD-10-CM | POA: Diagnosis not present

## 2023-10-29 ENCOUNTER — Encounter: Payer: Self-pay | Admitting: Gastroenterology

## 2023-11-01 ENCOUNTER — Ambulatory Visit (INDEPENDENT_AMBULATORY_CARE_PROVIDER_SITE_OTHER): Payer: Medicare Other | Admitting: Family Medicine

## 2023-11-01 ENCOUNTER — Encounter: Payer: Self-pay | Admitting: Family Medicine

## 2023-11-01 VITALS — BP 114/57 | HR 78 | Temp 97.5°F | Ht 64.0 in | Wt 200.0 lb

## 2023-11-01 DIAGNOSIS — E785 Hyperlipidemia, unspecified: Secondary | ICD-10-CM

## 2023-11-01 DIAGNOSIS — I700 Atherosclerosis of aorta: Secondary | ICD-10-CM

## 2023-11-01 DIAGNOSIS — R7303 Prediabetes: Secondary | ICD-10-CM

## 2023-11-01 DIAGNOSIS — L304 Erythema intertrigo: Secondary | ICD-10-CM | POA: Diagnosis not present

## 2023-11-01 MED ORDER — EMPAGLIFLOZIN 10 MG PO TABS: 10.0000 mg | ORAL_TABLET | Freq: Every day | ORAL | 0 refills | Status: DC

## 2023-11-01 NOTE — Progress Notes (Unsigned)
 Cardiology Office Note:  .   Date:  11/02/2023  ID:  Kimberly Obrien, DOB Aug 01, 1961, MRN [id number] PCP: Kimberly Claude, MD  [facility] Providers Cardiologist:  Kimberly Carrow, MD {  History of Present Illness: .   Kimberly Obrien is a 63 y.o. female with a past medical history of autoimmune hepatitis, prior tobacco abuse, COPD/asthma, arthritis and pulmonary embolism who is here for overdue cardiac follow-up.  She for started seeing Dr. Clifton Obrien February 2017.  Evaluated over the years for tachycardia, palpitations, fatigue, and dyspnea.  Echo April 2015 showed normal LV systolic and diastolic function with no valve issues.  Right and left heart cath in 2015 with no evidence of CAD and normal PA pressures.  Cardiac monitor 2016 with rare PVCs, PACs.  No history of A-fib.  CTA of the chest in 2015 with no PE but was supposed to stop in September 2018 due to GI bleed.  Colonoscopy September 2018 with 4 polyps removed and nonbleeding internal hemorrhoids as well as gastritis.  Seen by hematology.  Has a history of prior tobacco abuse and COPD.  Sees pulmonary and is felt to have mild COPD per patient.  Seen in our office November 2019 and had complained of dyspnea and palpitations.  Echo December 2019 with LVEF 65 to 70%, grade 1 DD, no valvular disease and normal PA pressures.  No pericardial effusion.  Cardiac monitor December 2019 was normal.  At her last visit May 2023 she denied any chest pain, dyspnea, lower extremity edema, orthopnea, PND, dizziness, near-syncope or syncope.  She did feel her heart fluttering for about 15 seconds a week ago but nothing sustained.  Today, she presents with a history of heart issues, COPD, and emphysema,  with intermittent episodes of heart fluttering and a sensation of passing out when lying down. The episodes of passing out are brief, lasting only a few seconds, and are not associated with dizziness. The patient has not noticed any  correlation between these episodes and her heart rate. The patient also reports shortness of breath, particularly with activity, such as going up and down stairs. The patient has had multiple heart monitors in the past, but no significant findings have been reported. The patient is not currently taking carvedilol or Jardiance due to side effects. The patient also reports a recent injury to her big toe after dropping a chair on it.  Reports no chest pain, pressure, or tightness. No edema, orthopnea, PND.   Discussed the use of AI scribe software for clinical note transcription with the patient, who gave verbal consent to proceed.   ROS: Pertinent ROS in HPI  Studies Reviewed: Marland Kitchen   EKG Interpretation Date/Time:  Friday November 02 2023 09:52:15 EDT Ventricular Rate:  69 PR Interval:  174 QRS Duration:  72 QT Interval:  360 QTC Calculation: 385 R Axis:   5  Text Interpretation: Normal sinus rhythm Possible Anterior infarct , age undetermined When compared with ECG of 23-Jun-2021 06:42, PREVIOUS ECG IS PRESENT Confirmed by Jari Favre [phone]) on 11/02/2023 10:08:30 AM    Echo 07/24/18: Left ventricle: The cavity size was normal. There was mild focal   basal hypertrophy of the septum. Systolic function was vigorous.   The estimated ejection fraction was in the range of 65% to 70%.   Wall motion was normal; there were no regional wall motion   abnormalities. Doppler parameters are consistent with abnormal   left ventricular relaxation (grade 1 diastolic dysfunction).   There was  no evidence of elevated ventricular filling pressure by   Doppler parameters. - Mitral valve: There was no regurgitation. - Right ventricle: The cavity size was normal. Wall thickness was   normal. Systolic function was normal. - Right atrium: The atrium was normal in size. - Tricuspid valve: There was no regurgitation. - Pulmonic valve: There was no regurgitation. - Pulmonary arteries: Systolic pressure was within  the normal   range. - Inferior vena cava: The vessel was normal in size. - Pericardium, extracardiac: There was no pericardial effusion.       Physical Exam:   VS:  BP 138/68   Pulse 71   Ht 5\' 4"  (1.626 m)   Wt 199 lb 12.8 oz (90.6 kg)   SpO2 93%   BMI 34.30 kg/m    Wt Readings from Last 3 Encounters:  11/02/23 199 lb 12.8 oz (90.6 kg)  11/01/23 200 lb (90.7 kg)  10/18/23 200 lb (90.7 kg)    GEN: Well nourished, well developed in no acute distress NECK: No JVD; No carotid bruits CARDIAC: RRR, no murmurs, rubs, gallops RESPIRATORY:  Clear to auscultation without rales, wheezing or rhonchi  ABDOMEN: Soft, non-tender, non-distended EXTREMITIES:  No edema; No deformity   ASSESSMENT AND PLAN: .   Palpitations Intermittent heart fluttering with rare extra beats and a four-beat run of SVT noted on previous monitoring. Not on carvedilol due to side effects and multiple drug allergies. - Order heart monitor to assess for arrhythmias and SVT. - Consider stress test if pulmonary evaluation does not explain symptoms.  Positional Syncope Episodes of near syncope when lying down, likely positional and possibly related to nerve compression due to neck issues or BPPV. Recommend follow-up with PCP  Heart Murmur Intermittent murmur with previous echocardiograms showing no significant valvular issues, suggesting benign causes.  Chronic Obstructive Pulmonary Disease (COPD) and Emphysema COPD and emphysema contribute to exertional dyspnea and palpitations. Symptoms may be more pulmonary in nature. - Refer to pulmonary specialist Kimberly Obrien for further evaluation and management.  Follow-up Not taking carvedilol or Jardiance due to side effects and allergies. Open to further cardiac evaluation and pulmonary referral. - Follow up with results of heart monitor and pulmonary consultation.      Dispo: She an follow-up in 6 weeks  Signed, Sharlene Dory, PA-C

## 2023-11-01 NOTE — Progress Notes (Signed)
 Subjective:  Patient ID: Kimberly Obrien, female    DOB: 11/25/1960  Age: 63 y.o. MRN: [id number]  CC: Medication Problem (States we were supposed to call a a cream in for yeast and never did./Also can not not afford jardiance, can Kimberly Obrien discontinue?)   HPI Kimberly Obrien presents forFollow-up of prediabetes. Patient denies symptoms such as polyuria, polydipsia, excessive hunger, nausea No significant hypoglycemic spells noted. Medications reviewed. Kimberly Obrien cannot afford Jardiance. Recent A1c noted to be 5.0.  Yeast cream for intertrigo requested. Rash persists.      11/01/2023    3:54 PM 09/05/2023   11:15 AM 05/02/2023    3:01 PM 11/29/2022    4:07 PM  GAD 7 : Generalized Anxiety Score  Nervous, Anxious, on Edge 0 0 1 1  Control/stop worrying 0 1 1 1   Worry too much - different things 1 1 1 1   Trouble relaxing 0 1 1 1   Restless 0 0 0 0  Easily annoyed or irritable 1 1 2 1   Afraid - awful might happen 0 0 0 0  Total GAD 7 Score 2 4 6 5   Anxiety Difficulty Not difficult at all Somewhat difficult Somewhat difficult Not difficult at all       11/01/2023    3:54 PM 10/18/2023   10:53 AM 09/05/2023   11:27 AM 09/05/2023   11:14 AM 05/02/2023    3:01 PM  Depression screen PHQ 2/9  Decreased Interest 1 0 0 0 1  Down, Depressed, Hopeless 0 0 0 0 1  PHQ - 2 Score 1 0 0 0 2  Altered sleeping 3 2  3 3   Tired, decreased energy 3 2  3 3   Change in appetite 0 0  0 0  Feeling bad or failure about yourself  0 0  0 0  Trouble concentrating 3 2  3 3   Moving slowly or fidgety/restless 0 0  0 0  Suicidal thoughts 0 0  0 0  PHQ-9 Score 10 6  9 11   Difficult doing work/chores Not difficult at all Not difficult at all  Somewhat difficult Somewhat difficult    History Kimberly Obrien has a past medical history of Anemia, Anticoagulation monitoring by pharmacist, Arthritis, Asthma, Blood transfusion without reported diagnosis, Cataract, Cirrhosis (HCC), COPD (chronic obstructive pulmonary disease)  (HCC), Diabetes mellitus without complication (HCC), Dyspnea, Elevated hemoglobin A1c (03/01/2018), Family history of Hodgkin's lymphoma, Family history of leukemia, Family history of lung cancer, Family history of stomach cancer, Family history of uterine cancer, GERD (gastroesophageal reflux disease), Heart murmur, History of colon polyps, History of pulmonary embolism (02/04/2013), Hyperlipidemia, NASH (nonalcoholic steatohepatitis), Neuromuscular disorder (HCC), PE (pulmonary embolism), Routine Papanicolaou smear (09/12/2021), Sleep apnea, Ureteral calculus, left (07/28/2021), and UTI symptoms (08/18/2021).   Kimberly Obrien has a past surgical history that includes Tubal ligation; Wrist surgery (Right); Cholecystectomy; Spine surgery; Abdominal hysterectomy (2009); left and right heart catheterization with coronary angiogram (N/A, 05/13/2014); Incontinence surgery (2009); Cataract extraction, bilateral (Bilateral); Esophagogastroduodenoscopy (egd) with propofol (N/A, 05/07/2017); Colonoscopy with propofol (N/A, 05/07/2017); IR Venogram Hepatic W Hemodynamic Evaluation (03/15/2018); IR Transcatheter BX (03/15/2018); and Breast biopsy (Left).   Her family history includes Bipolar disorder in her father; Cancer in her cousin, maternal uncle, paternal aunt, paternal aunt, and paternal uncle; Cirrhosis in her mother; Colon polyps in her sister; Endometrial cancer (age of onset: 57) in her daughter; Heart disease in her father; Heart failure in her father and paternal grandfather; Hodgkin's lymphoma (age of onset: 56) in an other family  member; Hyperlipidemia in her father; Hypertension in her brother; Irritable bowel syndrome in her mother and sister; Leukemia in her cousin; Lung cancer in her maternal uncle; Obesity in her daughter; Stomach cancer in her maternal uncle.Kimberly Obrien reports that Kimberly Obrien quit smoking about 11 years ago. Her smoking use included cigarettes. Kimberly Obrien started smoking about 46 years ago. Kimberly Obrien has a 99.9 pack-year  smoking history. Kimberly Obrien has never used smokeless tobacco. Kimberly Obrien reports that Kimberly Obrien does not drink alcohol and does not use drugs.  Current Outpatient Medications on File Prior to Visit  Medication Sig Dispense Refill   acetaminophen (TYLENOL) 500 MG tablet Take 500 mg by mouth every 6 (six) hours as needed.     albuterol (VENTOLIN HFA) 108 (90 Base) MCG/ACT inhaler Inhale 2 puffs into the lungs every 6 (six) hours as needed for wheezing or shortness of breath. 18 g 0   alendronate (FOSAMAX) 70 MG tablet Take 1 tablet (70 mg total) by mouth every 7 (seven) days. Take with a full glass of water on an empty stomach. 13 tablet 0   econazole nitrate 1 % cream Apply topically 2 (two) times daily. To affected areas 30 g 5   fluocinonide-emollient (LIDEX-E) 0.05 % cream Apply 1 Application topically 2 (two) times daily. 60 g 2   fluticasone (FLONASE) 50 MCG/ACT nasal spray Place 2 sprays into both nostrils daily. 48 g 1   glucose blood (ONETOUCH VERIO) test strip Test BS twice daily Dx e11.9 200 strip 3   ipratropium-albuterol (DUONEB) 0.5-2.5 (3) MG/3ML SOLN Take 3 mLs by nebulization every 6 (six) hours as needed. 360 mL 1   Lancets (ONETOUCH DELICA PLUS LANCET33G) MISC Test BS twice daily Dx e11.9 200 each 3   terbinafine (LAMISIL) 1 % cream Apply 1 Application topically 2 (two) times daily. 30 g 0   Blood Glucose Monitoring Suppl (ONETOUCH VERIO REFLECT) w/Device KIT Test BS twice daily Dx e11.9 1 kit 0   carvedilol (COREG) 3.125 MG tablet Take 1 tablet (3.125 mg total) by mouth 2 (two) times daily with a meal. (Patient not taking: Reported on 10/18/2023) 60 tablet 3   No current facility-administered medications on file prior to visit.    ROS Review of Systems  Constitutional: Negative.   HENT: Negative.    Eyes:  Negative for visual disturbance.  Respiratory:  Negative for shortness of breath.   Cardiovascular:  Negative for chest pain.  Gastrointestinal:  Negative for abdominal pain.   Musculoskeletal:  Negative for arthralgias.    Objective:  BP (!) 114/57   Pulse 78   Temp (!) 97.5 F (36.4 C)   Ht 5\' 4"  (1.626 m)   Wt 200 lb (90.7 kg)   SpO2 94%   BMI 34.33 kg/m   BP Readings from Last 3 Encounters:  11/01/23 (!) 114/57  09/05/23 114/61  05/02/23 115/60    Wt Readings from Last 3 Encounters:  11/01/23 200 lb (90.7 kg)  10/18/23 200 lb (90.7 kg)  09/05/23 200 lb (90.7 kg)     Physical Exam Constitutional:      General: Kimberly Obrien is not in acute distress.    Appearance: Kimberly Obrien is well-developed.  Cardiovascular:     Rate and Rhythm: Normal rate and regular rhythm.  Pulmonary:     Breath sounds: Normal breath sounds.  Musculoskeletal:        General: Normal range of motion.  Skin:    General: Skin is warm and dry.  Neurological:     Mental Status: Kimberly Obrien  is alert and oriented to person, place, and time.       Assessment & Plan:   There are no diagnoses linked to this encounter.    I am having Kimberly Obrien maintain her acetaminophen, OneTouch Verio Reflect, albuterol, ipratropium-albuterol, fluticasone, carvedilol, fluocinonide-emollient, econazole nitrate, OneTouch Verio, OneTouch Delica Plus Lancet33G, terbinafine, alendronate, and empagliflozin.  Meds ordered this encounter  Medications   empagliflozin (JARDIANCE) 10 MG TABS tablet    Sig: Take 1 tablet (10 mg total) by mouth daily.    Dispense:  90 tablet    Refill:  0     Follow-up: Return in about 3 months (around 02/01/2024).  Mechele Claude, M.D.

## 2023-11-02 ENCOUNTER — Encounter: Payer: Self-pay | Admitting: Physician Assistant

## 2023-11-02 ENCOUNTER — Ambulatory Visit: Payer: Medicare Other | Attending: Physician Assistant | Admitting: Physician Assistant

## 2023-11-02 ENCOUNTER — Ambulatory Visit: Attending: Physician Assistant

## 2023-11-02 VITALS — BP 138/68 | HR 71 | Ht 64.0 in | Wt 199.8 lb

## 2023-11-02 DIAGNOSIS — R011 Cardiac murmur, unspecified: Secondary | ICD-10-CM | POA: Diagnosis not present

## 2023-11-02 DIAGNOSIS — R002 Palpitations: Secondary | ICD-10-CM

## 2023-11-02 DIAGNOSIS — I5042 Chronic combined systolic (congestive) and diastolic (congestive) heart failure: Secondary | ICD-10-CM | POA: Diagnosis not present

## 2023-11-02 DIAGNOSIS — J449 Chronic obstructive pulmonary disease, unspecified: Secondary | ICD-10-CM | POA: Diagnosis not present

## 2023-11-02 NOTE — Progress Notes (Unsigned)
Enrolled for [employer] to mail a ZIO XT long term holter monitor to the patients address on file.   Dr. McAlhany to read. 

## 2023-11-02 NOTE — Patient Instructions (Signed)
 Medication Instructions:  NONE *If you need a refill on your cardiac medications before your next appointment, please call your pharmacy*   Lab Work: NONE If you have labs (blood work) drawn today and your tests are completely normal, you will receive your results only by: MyChart Message (if you have MyChart) OR A paper copy in the mail If you have any lab test that is abnormal or we need to change your treatment, we will call you to review the results.   Testing/Procedures: [facility] Monitor Your physician has requested that you wear a Zio heart monitor for 14 days. This will be mailed to your home with instructions on how to apply the monitor and how to return it when finished. Please allow 2 weeks after returning the heart monitor before our office calls you with the results.    Follow-Up: At [facility], you and your health needs are our priority.  As part of our continuing mission to provide you with exceptional heart care, we have created designated Provider Care Teams.  These Care Teams include your primary Cardiologist (physician) and Advanced Practice Providers (APPs -  Physician Assistants and Nurse Practitioners) who all work together to provide you with the care you need, when you need it.  We recommend signing up for the patient portal called "MyChart".  Sign up information is provided on this After Visit Summary.  MyChart is used to connect with patients for Virtual Visits (Telemedicine).  Patients are able to view lab/test results, encounter notes, upcoming appointments, etc.  Non-urgent messages can be sent to your provider as well.   To learn more about what you can do with MyChart, go to [email or web address].    Your next appointment:   6 week(s)  Provider:   APP  Other Instructions ZIO XT- Long Term Monitor Instructions  Your physician has requested you wear a ZIO patch monitor for 14 days.  This is a single patch monitor. [employer] supplies one patch  monitor per enrollment. Additional stickers are not available. Please do not apply patch if you will be having a Nuclear Stress Test,  Echocardiogram, Cardiac CT, MRI, or Chest Xray during the period you would be wearing the  monitor. The patch cannot be worn during these tests. You cannot remove and re-apply the  ZIO XT patch monitor.  Your ZIO patch monitor will be mailed 3 day USPS to your address on file. It may take 3-5 days  to receive your monitor after you have been enrolled.  Once you have received your monitor, please review the enclosed instructions. Your monitor  has already been registered assigning a specific monitor serial # to you.  Billing and Patient Assistance Program Information  We have supplied [employer] with any of your insurance information on file for billing purposes. [employer] offers a sliding scale Patient Assistance Program for patients that do not have  insurance, or whose insurance does not completely cover the cost of the ZIO monitor.  You must apply for the Patient Assistance Program to qualify for this discounted rate.  To apply, please call [employer] at [phone], select option 4, select option 2, ask to apply for  Patient Assistance Program. Meredeth Ide will ask your household income, and how many people  are in your household. They will quote your out-of-pocket cost based on that information.  [employer] will also be able to set up a 4-month, interest-free payment plan if needed.  Applying the monitor   Shave hair from upper left chest.  Hold abrader disc by orange tab. Rub abrader in 40 strokes over the upper left chest as  indicated in your monitor instructions.  Clean area with 4 enclosed alcohol pads. Let dry.  Apply patch as indicated in monitor instructions. Patch will be placed under collarbone on left  side of chest with arrow pointing upward.  Rub patch adhesive wings for 2 minutes. Remove white label marked "1". Remove the white  label marked "2".  Rub patch adhesive wings for 2 additional minutes.  While looking in a mirror, press and release button in center of patch. A small green light will  flash 3-4 times. This will be your only indicator that the monitor has been turned on.  Do not shower for the first 24 hours. You may shower after the first 24 hours.  Press the button if you feel a symptom. You will hear a small click. Record Date, Time and  Symptom in the Patient Logbook.  When you are ready to remove the patch, follow instructions on the last 2 pages of Patient  Logbook. Stick patch monitor onto the last page of Patient Logbook.  Place Patient Logbook in the blue and white box. Use locking tab on box and tape box closed  securely. The blue and white box has prepaid postage on it. Please place it in the mailbox as  soon as possible. Your physician should have your test results approximately 7 days after the  monitor has been mailed back to [facility].  Call [facility] Customer Care at [phone] if you have questions regarding  your ZIO XT patch monitor. Call them immediately if you see an orange light blinking on your  monitor.  If your monitor falls off in less than 4 days, contact our Monitor department at [phone].  If your monitor becomes loose or falls off after 4 days call [employer] at [phone] for  suggestions on securing your monitor

## 2023-11-03 ENCOUNTER — Encounter: Payer: Self-pay | Admitting: Family Medicine

## 2023-11-26 NOTE — Progress Notes (Signed)
 Patient notified of LDCT Lung Cancer Screening Results via mail with the recommendation to follow-up in 12 months. Patient's referring provider has been sent a copy of results. Results are as follows:  IMPRESSION: 1. Lung-RADS 2, benign appearance or behavior. Continue annual screening with low-dose chest CT without contrast in 12 months. 2. Coronary artery calcifications. 3. Cirrhotic appearing liver.  Splenomegaly. 4. Aortic Atherosclerosis (ICD10-I70.0) and Emphysema (ICD10-J43.9).

## 2023-12-03 DIAGNOSIS — R002 Palpitations: Secondary | ICD-10-CM | POA: Diagnosis not present

## 2023-12-10 DIAGNOSIS — R002 Palpitations: Secondary | ICD-10-CM

## 2023-12-21 ENCOUNTER — Encounter: Payer: Self-pay | Admitting: Gastroenterology

## 2023-12-21 ENCOUNTER — Ambulatory Visit: Admitting: Gastroenterology

## 2023-12-21 VITALS — BP 122/60 | HR 65 | Ht 64.0 in | Wt 199.0 lb

## 2023-12-21 DIAGNOSIS — K7460 Unspecified cirrhosis of liver: Secondary | ICD-10-CM | POA: Diagnosis not present

## 2023-12-21 DIAGNOSIS — I8500 Esophageal varices without bleeding: Secondary | ICD-10-CM

## 2023-12-21 DIAGNOSIS — K580 Irritable bowel syndrome with diarrhea: Secondary | ICD-10-CM | POA: Diagnosis not present

## 2023-12-21 MED ORDER — DICYCLOMINE HCL 10 MG PO CAPS: 10.0000 mg | ORAL_CAPSULE | Freq: Three times a day (TID) | ORAL | 1 refills | Status: AC | PRN

## 2023-12-21 MED ORDER — RIFAXIMIN 550 MG PO TABS: 550.0000 mg | ORAL_TABLET | Freq: Three times a day (TID) | ORAL | 0 refills | Status: AC

## 2023-12-21 MED ORDER — PROPRANOLOL HCL 10 MG PO TABS: 10.0000 mg | ORAL_TABLET | Freq: Two times a day (BID) | ORAL | 3 refills | Status: DC

## 2023-12-21 NOTE — Patient Instructions (Addendum)
 We have sent the following medications to your pharmacy for you to pick up at your convenience: Propranolol 10 mg : Take twice a day   After 2 weeks, if you are tolerating the propranolol well, then you can start the Xifaxan  and Bentyl   We have sent the following medications to your pharmacy for you to pick up at your convenience: Xifaxan  550 mg : Take three times a day for 14 days (please let us  know if this is not covered well) Bentyl  10 mg: Take once every 8 hours as needed  You will be due for a recall colonoscopy in 08-2024. We will send you a reminder in the mail when it gets closer to that time.  Thank you for entrusting me with your care and for choosing [facility], Dr. Alvester Johnson  If your blood pressure at your visit was 140/90 or greater, please contact your primary care physician to follow up on this. ______________________________________________________  If you are age 63 or older, your body mass index should be between 23-30. Your Body mass index is 34.16 kg/m. If this is out of the aforementioned range listed, please consider follow up with your Primary Care Provider.  If you are age 56 or younger, your body mass index should be between 19-25. Your Body mass index is 34.16 kg/m. If this is out of the aformentioned range listed, please consider follow up with your Primary Care Provider.  ________________________________________________________  The [place] [facility] providers would like to encourage you to use MYCHART to communicate with providers for non-urgent requests or questions.  Due to long hold times on the telephone, sending your provider a message by [facility] may be a faster and more efficient way to get a response.  Please allow 48 business hours for a response.  Please remember that this is for non-urgent requests.  _______________________________________________________  Due to recent changes in healthcare laws, you may see the results of your imaging and  laboratory studies on MyChart before your provider has had a chance to review them.  We understand that in some cases there may be results that are confusing or concerning to you. Not all laboratory results come back in the same time frame and the provider may be waiting for multiple results in order to interpret others.  Please give us  48 hours in order for your provider to thoroughly review all the results before contacting the office for clarification of your results.

## 2023-12-21 NOTE — Progress Notes (Signed)
 HPI :  63 year old female here for a follow up visit for NASH cirrhosis, chronic loose stools.    Cirrhosis history:  Previously followed at [facility], [place] [facility] (Dr. Albertina Alpers), and [facility] with cirrhosis several years ago. She had a positive ANA and mildly positive smooth muscle antibody and it sounds like she was started empirically on steroids at one point in time as well as budesonide .  Her liver enzymes were fluctuating.  She was seen by [facility] / Dr. Noni Beach office in 2018 who reviewed her history and recommended a liver biopsy.  At that point time biopsy showed grade 2 of 3 steatohepatitis along with cirrhosis.  Biopsy was most suggestive of Florentina Huntsman cirrhosis and not autoimmune hepatitis.  Her steroids were stopped at that time.  Her mother died of cirrhosis thought to be due to fatty liver disease.  The patient emphatically denies any significant alcohol use historically.   She has esophageal varices but no history of bleeding or other decompensations.    Recall she has also had a history of iron  deficiency anemia and GI bleeding in the past.  In 2018 with [facility] she had EGD and colonoscopy which did not yield cause for bleeding, reportedly had a questionable vascular lesion on a capsule endoscopy at that time.  She does not tolerate oral iron  very well, and had a reaction to IV iron  in the past.   SINCE LAST VISIT  Patient has been doing well in regards to her cirrhosis.  She has not had any overt decompensations I am aware of.  She was last seen about a year ago for her upper endoscopy.  She was noted to have varices on that exam that were increased in size from previous.  I had recommended she started Coreg  at the time.  I thought she had been taking this over the past year, she states she took a few doses and felt that it "altered her bowels" and so she did not want to take it anymore.  Thus she has not been taking anything for her varices over the past year.  She has  had no jaundice, no encephalopathy, no ascites.  She does not drink alcohol.  She had HCC screening with an ultrasound in March which showed no new changes.  She has had last set of labs in January.  Her main complaint has been ongoing issues with her bowels.  She has chronic loose stools, often postprandial as well as some bloating.  She has been tried on a few regimens for this.  Prior colonoscopies have not shown any inflammatory changes, biopsies negative for microscopic colitis.  Recall I previously gave her a trial of Colestid  which she states made her bloated and she did not want to take it.  She states she does not like taking Imodium .  I gave her Bentyl  at the last visit and she does not think she ever took it.  She has tried taking "digestive advantage" over-the-counter which is a probiotic, she is not sure if that is provided relief or not.  Her bowel symptoms are stable since I last seen her and this is not new, chronic issue.  We discussed options  Prior workup:  Not immune to hep A / B 05/2019 - she declined vaccination Alpha one, AMA nehgative, iron  studies okay, celiac serology negative      Liver biopsy 03/15/18 -  Liver, needle/core biopsy, transjug - MILD TO MODERATELY ACTIVE STEATOHEPATITIS (GRADE 1-2 OF 3;  IF DETERMINED TO BE NAFLD, THEN THE NAS = 3 OF 8). - CIRRHOSIS (STAGE 4 OF 4) The biopsy is adequate for review and consists of several cores of liver with a distorted architecture. Trichrome stain highlights the presence of septal fibrosis as well as a pericellular pattern of fibrosis. Hepatic nodules show mild macrovesicular steatosis (approximately 20%) with mild to moderate ballooning degeneration and few poorly formed Mallory hyalins. Hepatic nodules do not show any significant inflammation. Definitive megamitochondria are not seen. The portal tracts and fibrous septa mild to moderate, mostly lymphocytic inflammation without significant interface hepatitis. Mild ductular  reaction is present with associated nonspecific neutrophilic response. Iron  stain shows moderate granular iron  accumulation within Kupffer cells, consistent with secondary iron  overload. PASD stain shows no globular inclusions in hepatocytes. The findings are consistent with mold to moderately acive steatohepatitis, alcoholic and/or non-alcoholic, with cirrhosis.     EGD 07/29/20 -  - A 1 cm hiatal hernia was present. - Small (< 5 mm) varices were found in the lower third of the esophagus without any high risk stigmata for bleeding - The exam of the esophagus was otherwise normal. - Moderate suspected portal hypertensive gastropathy was found in the entire examined stomach - snakeskin appearance with erythema and adherent heme. Biopsies were taken with a cold forceps for Helicobacter pylori testing. - A single 4-5 mm sessile polyp was found in the gastric fundus with a yellowish hue. Biopsies were taken with a cold forceps for histology. - The exam of the stomach was otherwise normal. No gastric varices - The duodenal bulb and second portion of the duodenum were normal.       Colonoscopy 07/29/20 - One 5 mm polyp in the ascending colon, removed with a cold snare. Resected and retrieved. - Two 5 mm polyps at the hepatic flexure, removed with a cold snare. Resected and retrieved. - Five 4 to 8 mm polyps in the transverse colon, removed with a cold snare. Resected and retrieved. - Two 4 to 5 mm polyps in the descending colon, removed with a cold snare. Resected and retrieved. - Four 3 to 4 mm polyps in the sigmoid colon, removed with a cold snare. Resected and retrieved. - Two 3 to 4 mm polyps in the rectum, removed with a cold snare. Resected and retrieved. - A few recently bleeding colonic angiodysplastic lesions. - Internal hemorrhoids. - The examination was otherwise normal.   1. Surgical [P], stomach, gastric - MILD CHRONIC GASTRITIS. MILD REACTIVE GASTROPATHY. - WARTHIN-STARRY STAIN  IS NEGATIVE FOR HELICOBACTER PYLORI. 2. Surgical [P], stomach, gastric polyp (1) - HYPERPLASTIC POLYP. - NEGATIVE FOR GOBLET CELL METAPLASIA. 3. Surgical [P], colon, sigmoid, hepatic flexure, ascending, transverse, descending, rectal, polyps (16) - TUBULAR ADENOMA (X MULTIPLE). - NEGATIVE FOR HIGH GRADE DYSPLASIA.     Abdominal wall trigger point injection 01/11/21     Tested negative for C diff 07/11/21     EGD 09/06/21: - A 1 cm hiatal hernia was present. - Small varices were found in the middle third of the esophagus and in the lower third of the esophagus, unchanged from the prior exam. No high risk stigmata for bleeding. - The exam of the esophagus was otherwise normal. - Portal hypertensive gastropathy was found in the entire examined stomach (prior biopsies on last exam ruled out H pylori - no further biopsies taken today) - The exam of the stomach was otherwise normal. No gastric varices. - A single diminutive angiodysplastic lesion without bleeding was found in the second  portion of the duodenum. - The exam of the duodenum was otherwise normal.     Colonoscopy 09/06/21: - The perianal and digital rectal examinations were normal. - A few small angiodysplastic lesions were found in the rectum, in the sigmoid colon, in the transverse colon and in the ascending colon. - Internal hemorrhoids were found during retroflexion. - The exam was otherwise without abnormality. - Biopsies for histology were taken with a cold forceps from the right colon, left colon and transverse colon for evaluation of microscopic colitis given patient's incidental complaint of persistent loose stools.     Surgical [P], random colon - diarrhea BENIGN COLONIC MUCOSA WITH NO DIAGNOSTIC ABNORMALITY    Echocardiogram 01/25/22: EF 65-70%,      RUQ US  06/20/22: IMPRESSION: Morphologic changes compatible with cirrhosis. No focal lesion.   EGD 12/15/22: - Esophagogastric landmarks were identified:  the Z-line was found at 38 cm, the gastroesophageal junction was found at 38 cm and the upper extent of the gastric folds was found at 40 cm from the incisors. Findings: - A 2 cm hiatal hernia was present. - Grade II varices were found in the middle third of the esophagus and in the lower third of the esophagus. They were small to medium in size - no high risk stigmata for bleeding noted, but slightly increased in sized compared to the last exam. - The exam of the esophagus was otherwise normal. - Moderate portal hypertensive gastropathy was found in the entire examined stomach. Previously has tested negative for H pylori. - The exam of the stomach was otherwise normal. No gastric varices. - The examined duodenum was normal.   RUQ US  10/28/23: IMPRESSION: Cirrhotic liver morphology. No focal lesion identified.   Past Medical History:  Diagnosis Date   Anemia    Anticoagulation monitoring by pharmacist    Arthritis    Asthma    Blood transfusion without reported diagnosis    Cataract    removed both eyes   Cirrhosis (HCC)    COPD (chronic obstructive pulmonary disease) (HCC)    Diabetes mellitus without complication (HCC)    Dyspnea    exertion   Elevated hemoglobin A1c 03/01/2018   Family history of Hodgkin's lymphoma    Family history of leukemia    Family history of lung cancer    Family history of stomach cancer    Family history of uterine cancer    GERD (gastroesophageal reflux disease)    mild- diet related   Heart murmur    History of colon polyps    History of pulmonary embolism 02/04/2013   Hyperlipidemia    controlled   NASH (nonalcoholic steatohepatitis)    Neuromuscular disorder (HCC)    back surgery   PE (pulmonary embolism)    Routine Papanicolaou smear 09/12/2021   Sleep apnea    no cpap   Ureteral calculus, left 07/28/2021   UTI symptoms 08/18/2021     Past Surgical History:  Procedure Laterality Date   ABDOMINAL HYSTERECTOMY  2009   BREAST BIOPSY Left     CATARACT EXTRACTION, BILATERAL Bilateral    CHOLECYSTECTOMY     COLONOSCOPY WITH PROPOFOL  N/A 05/07/2017   Procedure: COLONOSCOPY WITH PROPOFOL ;  Surgeon: Jolinda Necessary, MD;  Location: [facility];  Service: Endoscopy;  Laterality: N/A;   ESOPHAGOGASTRODUODENOSCOPY (EGD) WITH PROPOFOL  N/A 05/07/2017   Procedure: ESOPHAGOGASTRODUODENOSCOPY (EGD) WITH PROPOFOL ;  Surgeon: Jolinda Necessary, MD;  Location: [facility];  Service: Endoscopy;  Laterality: N/A;   INCONTINENCE SURGERY  2009  with hysterectomy   IR TRANSCATHETER BX  03/15/2018   IR VENOGRAM HEPATIC W HEMODYNAMIC EVALUATION  03/15/2018   LEFT AND RIGHT HEART CATHETERIZATION WITH CORONARY ANGIOGRAM N/A 05/13/2014   Procedure: LEFT AND RIGHT HEART CATHETERIZATION WITH CORONARY ANGIOGRAM;  Surgeon: Odie Benne, MD;  Location: [facility] CATH LAB;  Service: Cardiovascular;  Laterality: N/A;   SPINE SURGERY     L5 S1 fusion x4   TUBAL LIGATION     WRIST SURGERY Right    Had surgery twice to shave bone for blood circulation improvement.   Family History  Problem Relation Age of Onset   Cirrhosis Mother        NASH   Irritable bowel syndrome Mother    Hyperlipidemia Father    Heart failure Father    Bipolar disorder Father    Heart disease Father    Colon polyps Sister        unknown number but fewer than patient   Irritable bowel syndrome Sister    Obesity Daughter    Endometrial cancer Daughter 18   Stomach cancer Maternal Uncle        dx 40s/50s   Lung cancer Maternal Uncle        dx 40s/50s, smoker   Cancer Maternal Uncle        NOS   Cancer Paternal Aunt        NOS   Cancer Paternal Aunt        NOS   Cancer Paternal Uncle        NOS   Heart failure Paternal Grandfather    Leukemia Cousin        dx 30s, died 2 weeks after diagnosis (maternal first cousin)   Cancer Cousin        NOS (paternal first cousin)   Hypertension Brother    Hodgkin's lymphoma Other 14       4th degree relative (mother's  half-sibling's son)   Heart attack Neg Hx    Miscarriages / Stillbirths Neg Hx    Colon cancer Neg Hx    Esophageal cancer Neg Hx    Rectal cancer Neg Hx    Breast cancer Neg Hx    Social History   Tobacco Use   Smoking status: Former    Current packs/day: 0.00    Average packs/day: 2.0 packs/day for 50.0 years (99.9 ttl pk-yrs)    Types: Cigarettes    Start date: 08/22/1977    Quit date: 04/21/2012    Years since quitting: 11.6   Smokeless tobacco: Never  Vaping Use   Vaping status: Never Used  Substance Use Topics   Alcohol use: No   Drug use: No   Current Outpatient Medications  Medication Sig Dispense Refill   acetaminophen  (TYLENOL ) 500 MG tablet Take 500 mg by mouth every 6 (six) hours as needed.     albuterol  (VENTOLIN  HFA) 108 (90 Base) MCG/ACT inhaler Inhale 2 puffs into the lungs every 6 (six) hours as needed for wheezing or shortness of breath. 18 g 0   alendronate  (FOSAMAX ) 70 MG tablet Take 1 tablet (70 mg total) by mouth every 7 (seven) days. Take with a full glass of water on an empty stomach. 13 tablet 0   Blood Glucose Monitoring Suppl (ONETOUCH VERIO REFLECT) w/Device KIT Test BS twice daily Dx e11.9 1 kit 0   carvedilol  (COREG ) 3.125 MG tablet Take 1 tablet (3.125 mg total) by mouth 2 (two) times daily with a meal. 60 tablet 3  econazole nitrate  1 % cream Apply topically 2 (two) times daily. To affected areas 30 g 5   fluocinonide -emollient (LIDEX -E) 0.05 % cream Apply 1 Application topically 2 (two) times daily. 60 g 2   fluticasone  (FLONASE ) 50 MCG/ACT nasal spray Place 2 sprays into both nostrils daily. 48 g 1   glucose blood (ONETOUCH VERIO) test strip Test BS twice daily Dx e11.9 200 strip 3   ipratropium-albuterol  (DUONEB) 0.5-2.5 (3) MG/3ML SOLN Take 3 mLs by nebulization every 6 (six) hours as needed. 360 mL 1   Lancets (ONETOUCH DELICA PLUS LANCET33G) MISC Test BS twice daily Dx e11.9 200 each 3   terbinafine  (LAMISIL ) 1 % cream Apply 1 Application  topically 2 (two) times daily. 30 g 0   empagliflozin  (JARDIANCE ) 10 MG TABS tablet Take 1 tablet (10 mg total) by mouth daily. (Patient not taking: Reported on 12/21/2023) 90 tablet 0   No current facility-administered medications for this visit.   Allergies  Allergen Reactions   Aciphex [Rabeprazole Sodium] Rash   Fluconazole Rash and Hives   Lansoprazole Hives   Septra [Sulfamethoxazole-Trimethoprim] Nausea And Vomiting    "made me bleed"   Sucralfate  Nausea Only   Macrobid  [Nitrofurantoin ] Diarrhea   Vancomycin  Diarrhea   Claritin [Loratadine] Other (See Comments)    Tired    Crestor  [Rosuvastatin ] Other (See Comments)    Elevated blood glucose and abdominal pain   Doxycycline Rash   Ibuprofen Other (See Comments)    Upset stomach   Lipitor [Atorvastatin] Other (See Comments)    Myalgias, leg pain.  Problems with pancreas, sugars went up also.   Metoprolol  Nausea And Vomiting   Metronidazole  Other (See Comments) and Nausea Only    Dizziness, nausea, dry mouth and some shortness of breath   Nsaids Other (See Comments)    Upset stomach   Omeprazole      bloating   Rabeprazole Rash     Review of Systems: All systems reviewed and negative except where noted in HPI.   Lab Results  Component Value Date   WBC 4.4 09/05/2023   HGB 15.2 09/05/2023   HCT 44.7 09/05/2023   MCV 88 09/05/2023   PLT 80 (LL) 09/05/2023   Lab Results  Component Value Date   NA 146 (H) 09/05/2023   CL 108 (H) 09/05/2023   K 3.8 09/05/2023   CO2 20 09/05/2023   BUN 9 09/05/2023   CREATININE 0.60 09/05/2023   EGFR 101 09/05/2023   CALCIUM  9.3 09/05/2023   PHOS 1.5 (L) 04/11/2019   ALBUMIN 3.7 (L) 09/05/2023   GLUCOSE 182 (H) 09/05/2023    Lab Results  Component Value Date   ALT 26 09/05/2023   AST 41 (H) 09/05/2023   ALKPHOS 97 09/05/2023   BILITOT 2.9 (H) 09/05/2023    Lab Results  Component Value Date   INR 1.5 (H) 06/30/2022   INR 1.3 (H) 09/13/2021   INR 1.3 (H) 12/29/2020     Physical Exam: BP 122/60   Pulse 65   Ht 5\' 4"  (1.626 m)   Wt 199 lb (90.3 kg)   BMI 34.16 kg/m  Constitutional: Pleasant,well-developed, female in no acute distress. Neurological: Alert and oriented to person place and time. Psychiatric: Normal mood and affect. Behavior is normal.   ASSESSMENT: 63 y.o. female here for assessment of the following  1. Cirrhosis of liver without ascites, unspecified hepatic cirrhosis type (HCC)   2. Esophageal varices without bleeding, unspecified esophageal varices type (HCC)   3. Irritable bowel  syndrome with diarrhea    Compensated MASH cirrhosis, she does have varices but has not had bleeding.  I had thought she was on Coreg  since her last visit, she did not tell me she did not tolerate it and just stopped it.  We discussed varices, interval growth over time and risk for bleeding moving forward.  We discussed if she want to try another medication regimen for this, either propranolol or nadolol, versus repeat EGD and potentially doing banding if any high risk stigmata.  We discussed each of these modalities.  She is willing to start a trial of low-dose propranolol just to see if she tolerates it, and can titrate up if she tolerates it.  Would start 10 mg twice daily.  I asked her to only start this initially to make sure if she has any side effects she knows what is causing it.  If she does not tolerate this I asked her to contact me so we did can discuss other options.  Otherwise we discussed risk for decompensation and HCC.  She is due in September for [facility] screening with an ultrasound.  I will repeat her labs in August.  Otherwise discussed her bowel dysfunction, these are chronic changes.  I had thought Colestid  may really help her given her postcholecystectomy state however she had intolerance to it.  She has a lot of bloating at baseline.  I suspect she probably has IBS-D.  We discussed other options given predominant bloating we will try her on a  course of rifaximin  550 mg 3 times daily for 2 weeks.  Hopefully insurance will cover this, if not she will let me know.  I told her not to try this until she is at least try propranolol to make sure she tolerates it.  I also gave her Bentyl  to use as needed if she wants to try that, she did not use it the last time I gave it to her.  She inquires about probiotic.  If she finds it helpful she can certainly take that.  I would not take it right now while we are starting her new medications, especially with the rifaximin .  That being said I do not necessarily recommend probiotic for her current symptoms, up to her if she really wants to try that in the future or not if she finds it beneficial.   PLAN: - start propranolol 10mg  BID - titrate up as needed - 1 month supply to start with refills, goal 20mg  BID. Will reach out in 1 month to see if she is taking it - RUQ US  in September - labs in August - CBC, CMET, INR, AFP - start rifaximin  550mg  TID for 2 weeks - ordered to pharmacy if covered. Don't start this until we know she tolerates propranolol - bentyl  10mg  every 8 hours PRN  - up to her if she wants to take probiotic - if it helps she can take it, however would not take if using rifaximin  - due for colonoscopy Jan 2026 - f/u in 6 months or sooner with issues  Christi Coward, MD [facility]

## 2023-12-24 ENCOUNTER — Telehealth: Payer: Self-pay

## 2023-12-24 ENCOUNTER — Other Ambulatory Visit (HOSPITAL_COMMUNITY): Payer: Self-pay

## 2023-12-24 ENCOUNTER — Encounter: Payer: Self-pay | Admitting: Hematology

## 2023-12-24 NOTE — Telephone Encounter (Signed)
 Pharmacy Patient Advocate Encounter   Received notification from CoverMyMeds that prior authorization for Xifaxan  550MG  tablets is required/requested.   Insurance verification completed.   The patient is insured through [facility] Medicare Part D .   Per test claim: PA required; PA submitted to above mentioned insurance via CoverMyMeds Key/confirmation #/EOC [id number] Status is pending

## 2023-12-25 ENCOUNTER — Other Ambulatory Visit (HOSPITAL_COMMUNITY): Payer: Self-pay

## 2023-12-25 NOTE — Telephone Encounter (Signed)
 Jan I don't think there is anyway this patient could afford this without significant assistance.  Gurney Lefort can this patient be put on the wait list for a free sample? Thanks

## 2023-12-25 NOTE — Telephone Encounter (Signed)
 Looks like co-pay is [id number]  Patient may have deductible to meet. Patient is also likely to be eligible for the Medicare Prescription Payment Plan and will need to contact her insurance for additional information.

## 2023-12-25 NOTE — Telephone Encounter (Signed)
 Pharmacy Patient Advocate Encounter  Received notification from [employer] Medicare Part D that Prior Authorization for Xifaxan  550MG  tablets has been APPROVED from 12-24-2023 to 01-07-2024   PA #/Case ID/Reference #: [id number]

## 2023-12-26 NOTE — Telephone Encounter (Signed)
 Called and spoke to patient.  She will come get the Xifaxan  samples on Friday after her doctor's appointment. She understands to come to the 2nd floor reception.  And to take 1 tablet, three times a day, for 14 days

## 2023-12-26 NOTE — Telephone Encounter (Signed)
 A sample of Xifaxan  550 mg TID for 14 days was secured for patient.

## 2024-01-03 NOTE — Progress Notes (Unsigned)
 Cardiology Office Note:    Date:  01/04/2024  ID:  Arther Larve, DOB 1960-09-12, MRN [id number] PCP: Roise Cleaver, MD  [facility] Providers Cardiologist:  Antoinette Batman, MD       Patient Profile:      Palpitations Monitor 10/2023: NSR, average heart rate 74; 6 SVT runs (longest 9 beats), PVCs, PACs <1% Coronary artery calcification by CT (05/2019) LHC 05/13/2014: no CAD TTE 01/25/22: EF 65-70, no RWMA, GLS -25.9, normal RVSF, trivial MR Aortic atherosclerosis  Diabetes mellitus Autoimmune hepatitis Chronic Obstructive Pulmonary Disease/Asthma Multifactorial, chronic shortness of breath (COPD, anemia, deconditioning) Hx of recurrent pulmonary embolism (1999, 2003) OSA Hx of GI bleed anticoagulation DC'd  Cirrhosis/NASH Ex-smoker            Discussed the use of AI scribe software for clinical note transcription with the patient, who gave verbal consent to proceed.  History of Present Illness Kimberly Obrien is a 63 y.o. female who returns for follow up of palpitations. She was evaluated by Lovette Rud, PA-C in 10/2023 with symptoms of intermittent palpitations and positional near syncope (occurring when laying down). She also noted dyspnea on exertion. She was referred back to Pulmonology. Her appt is June 20 with Dr. Baldwin Levee. Notes indicate she was not taking Carvedilol  and Jardiance  due to "side effects." A monitor was obtained and demonstrated no sustained arrhythmias. There were a few brief SVT runs and rare PVCs and PACs.  She is here alone. She experiences intermittent palpitations.  Her gastroenterologist recently placed on propranolol  due to esophageal varices.  She notes occasional positional dizziness.  She also notes fairly chronic, stable shortness of breath with exertion. She experiences shortness of breath with exertion such as climbing hills or stairs, which improves with rest.  Her shortness of breath has been consistent over the years, with  some variability depending on weather conditions. She describes episodes of dizziness, particularly when lying down, which she likens to a 'merry-go-round' sensation. This has been occurring for the past few months and is sometimes triggered by quick movements or turning corners.     ROS-See HPI       Studies Reviewed:        Results    Risk Assessment/Calculations:             Physical Exam:   VS:  BP (!) 121/50   Pulse 78   Ht 5\' 4"  (1.626 m)   Wt 200 lb 12.8 oz (91.1 kg)   SpO2 93%   BMI 34.47 kg/m    Wt Readings from Last 3 Encounters:  01/04/24 200 lb 12.8 oz (91.1 kg)  12/21/23 199 lb (90.3 kg)  11/02/23 199 lb 12.8 oz (90.6 kg)    Constitutional:      Appearance: Healthy appearance. Not in distress.  Neck:     Vascular: JVD normal.  Pulmonary:     Breath sounds: Normal breath sounds. No wheezing. No rales.  Cardiovascular:     Normal rate. Regular rhythm.     Murmurs: There is a grade 1/6 early systolic murmur at the ULSB.  Edema:    Peripheral edema absent.  Abdominal:     Palpations: Abdomen is soft.        Assessment and Plan:  Assessment and Plan Assessment & Plan Palpitations Intermittent palpitations. She mainly notes tachycardia with exertion associated with shortness of breath.  Her recent monitor showed sinus rhythm with six SVT runs, longest nine beats, and rare PVCs and PACs. Propranolol   was prescribed for esophageal varices by her gastroenterologist.  This should help improve her palpitations as well. - Continue propranolol  as prescribed.  COPD and asthma Dyspnea is likely multifactorial and related to COPD/asthma, deconditioning.  She has follow-up pending with pulmonology.  Aortic atherosclerosis She has thrombocytopenia, likely related to her liver disease.  Therefore, I would not start her on aspirin  therapy.  Cardiac murmur Echocardiogram in 2023 demonstrated trivial MR normal EF.  No further testing needed at this time.  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  I offered to refer her to vestibular rehab.  She prefers to discuss this with her PCP.           Dispo:  Return in about 1 year (around 01/03/2025) for Routine Follow Up, w/ Dr. Abel Hoe.  Signed, Marlyse Single, PA-C

## 2024-01-04 ENCOUNTER — Ambulatory Visit: Attending: Physician Assistant | Admitting: Physician Assistant

## 2024-01-04 ENCOUNTER — Encounter: Payer: Self-pay | Admitting: Physician Assistant

## 2024-01-04 VITALS — BP 121/50 | HR 78 | Ht 64.0 in | Wt 200.8 lb

## 2024-01-04 DIAGNOSIS — R002 Palpitations: Secondary | ICD-10-CM

## 2024-01-04 DIAGNOSIS — R0602 Shortness of breath: Secondary | ICD-10-CM | POA: Diagnosis not present

## 2024-01-04 NOTE — Patient Instructions (Signed)
 Medication Instructions:  Your physician recommends that you continue on your current medications as directed. Please refer to the Current Medication list given to you today.  *If you need a refill on your cardiac medications before your next appointment, please call your pharmacy*  Lab Work: None ordered  If you have labs (blood work) drawn today and your tests are completely normal, you will receive your results only by: MyChart Message (if you have MyChart) OR A paper copy in the mail If you have any lab test that is abnormal or we need to change your treatment, we will call you to review the results.  Testing/Procedures: None ordered  Follow-Up: At [facility], you and your health needs are our priority.  As part of our continuing mission to provide you with exceptional heart care, our providers are all part of one team.  This team includes your primary Cardiologist (physician) and Advanced Practice Providers or APPs (Physician Assistants and Nurse Practitioners) who all work together to provide you with the care you need, when you need it.  Your next appointment:   12 month(s)  Provider:   Antoinette Batman, MD    We recommend signing up for the patient portal called "MyChart".  Sign up information is provided on this After Visit Summary.  MyChart is used to connect with patients for Virtual Visits (Telemedicine).  Patients are able to view lab/test results, encounter notes, upcoming appointments, etc.  Non-urgent messages can be sent to your provider as well.   To learn more about what you can do with MyChart, go to [email or web address].   Other Instructions

## 2024-01-10 ENCOUNTER — Other Ambulatory Visit: Payer: Self-pay | Admitting: Family Medicine

## 2024-01-10 DIAGNOSIS — M8580 Other specified disorders of bone density and structure, unspecified site: Secondary | ICD-10-CM

## 2024-01-11 ENCOUNTER — Telehealth: Payer: Self-pay | Admitting: Gastroenterology

## 2024-01-11 DIAGNOSIS — K7460 Unspecified cirrhosis of liver: Secondary | ICD-10-CM

## 2024-01-11 DIAGNOSIS — I8500 Esophageal varices without bleeding: Secondary | ICD-10-CM

## 2024-01-11 NOTE — Telephone Encounter (Signed)
 Patient called and stated that she is currently taking INDERAL  10 MG and some side effects that she is having are Dizziness and Drowsiness. Patient stated that if she were to take her medication at 7 am she will feel dizzy and sleepy until 7 PM. Patient is requesting a call back to discuss this type of medication further with the nurse. Please advise.

## 2024-01-11 NOTE — Telephone Encounter (Signed)
 I have spoken to patient to advise that she hold propranolol  since she is not tolerating this. We will go ahead and schedule her for endoscopy for updated variceal screen since she is unable to tolerate the beta blockers. Patient verbalizes understanding of this information. She has scheduled endoscopy on 02/29/24 at 230 pm. Patient has been advised of time/date/location for upcoming procedure and has been given generalized verbal prep instructions. Discussed that a care partner 18 years or older should bring her, stay for the procedure and drive home due to sedation. Written instructions have been made available to the patient for additional review.

## 2024-01-11 NOTE — Telephone Encounter (Signed)
 Dr. General Kenner, I called patient and she sated she is feeling much better at this time, no longer feels so sleepy or light headed. She took Propanolol 10mg  one tab bid as prescribed and felt sleepy and dizzy. She reduced Propanolol to 10mg  once daily and still had he same symptoms, took 24 hours off of Propanolol before she felt better. Last took Propanolol 2 days ago. Her base line HR was 65 prior to starting Propanolol. Last EGD to assess esophageal varices was 11/2022, not sure if you plan on doing an EGD at this juncture since she was not tolerant to Propanolol?  I suspect she would have the same response if she was prescribed Nadolol or Carvedilol . Defer further recommendations to you. She feels fine at this time.

## 2024-01-11 NOTE — Telephone Encounter (Signed)
 Thanks [employer]. Agree would stop all beta blockade, sounds like she does not tolerate it at all. In this light, would do an EGD, it has been over a year since her last exam.  If small or medium sized varices may  monitor, if they have enlarged would consider banding.  Can schedule at the [facility] with me, nonurgent.   POD A RN - can you please schedule this patient for an EGD with me in the [facility] of note I am currently traveling, just happened to see this message. I may not have access to epic for the next 10 days or so, in regards to future messages.

## 2024-02-07 ENCOUNTER — Encounter: Payer: Self-pay | Admitting: Family Medicine

## 2024-02-07 ENCOUNTER — Ambulatory Visit: Admitting: Family Medicine

## 2024-02-07 VITALS — BP 110/62 | HR 69 | Temp 98.5°F | Ht 64.0 in | Wt 201.0 lb

## 2024-02-07 DIAGNOSIS — I700 Atherosclerosis of aorta: Secondary | ICD-10-CM | POA: Diagnosis not present

## 2024-02-07 DIAGNOSIS — J0100 Acute maxillary sinusitis, unspecified: Secondary | ICD-10-CM | POA: Diagnosis not present

## 2024-02-07 DIAGNOSIS — E756 Lipid storage disorder, unspecified: Secondary | ICD-10-CM

## 2024-02-07 DIAGNOSIS — E559 Vitamin D deficiency, unspecified: Secondary | ICD-10-CM | POA: Diagnosis not present

## 2024-02-07 DIAGNOSIS — R5383 Other fatigue: Secondary | ICD-10-CM | POA: Diagnosis not present

## 2024-02-07 DIAGNOSIS — E1169 Type 2 diabetes mellitus with other specified complication: Secondary | ICD-10-CM | POA: Insufficient documentation

## 2024-02-07 DIAGNOSIS — E785 Hyperlipidemia, unspecified: Secondary | ICD-10-CM

## 2024-02-07 MED ORDER — AMOXICILLIN-POT CLAVULANATE 875-125 MG PO TABS: 1.0000 | ORAL_TABLET | Freq: Two times a day (BID) | ORAL | 0 refills | Status: DC

## 2024-02-07 MED ORDER — FEXOFENADINE-PSEUDOEPHED ER 180-240 MG PO TB24: 1.0000 | ORAL_TABLET | Freq: Every evening | ORAL | 11 refills | Status: AC

## 2024-02-07 NOTE — Progress Notes (Unsigned)
 Subjective:  Patient ID: Kimberly Obrien,  female    DOB: 06/27/1961  Age: 63 y.o.    CC: Medical Management of Chronic Issues (3 month)   HPI Kimberly Obrien presents for  follow-up of elevated cholesterol. Doing well without complaints on current medication. Denies side effects  including myalgia and arthralgia and nausea. Also in today for liver function testing. Currently no chest pain, shortness of breath or other cardiovascular related symptoms noted.  Follow-up of diabetes. Patient does check blood sugar at home. Readings run between *** and *** Patient denies symptoms such as excessive hunger or urinary frequency, excessive hunger, nausea No significant hypoglycemic spells noted. Medications reviewed. Pt reports taking them regularly. Pt. denies complication/adverse reaction today.    History Kimberly Obrien has a past medical history of Anemia, Anticoagulation monitoring by pharmacist, Arthritis, Asthma, Blood transfusion without reported diagnosis, Cataract, Cirrhosis (HCC), COPD (chronic obstructive pulmonary disease) (HCC), Diabetes mellitus without complication (HCC), Dyspnea, Elevated hemoglobin A1c (03/01/2018), Family history of Hodgkin's lymphoma, Family history of leukemia, Family history of lung cancer, Family history of stomach cancer, Family history of uterine cancer, GERD (gastroesophageal reflux disease), Heart murmur, History of colon polyps, History of pulmonary embolism (02/04/2013), Hyperlipidemia, NASH (nonalcoholic steatohepatitis), Neuromuscular disorder (HCC), PE (pulmonary embolism), Routine Papanicolaou smear (09/12/2021), Sleep apnea, Ureteral calculus, left (07/28/2021), and UTI symptoms (08/18/2021).   She has a past surgical history that includes Tubal ligation; Wrist surgery (Right); Cholecystectomy; Spine surgery; Abdominal hysterectomy (2009); left and right heart catheterization with coronary angiogram (N/A, 05/13/2014); Incontinence surgery (2009);  Cataract extraction, bilateral (Bilateral); Esophagogastroduodenoscopy (egd) with propofol  (N/A, 05/07/2017); Colonoscopy with propofol  (N/A, 05/07/2017); IR Venogram Hepatic W Hemodynamic Evaluation (03/15/2018); IR Transcatheter BX (03/15/2018); and Breast biopsy (Left).   Her family history includes Bipolar disorder in her father; Cancer in her cousin, maternal uncle, paternal aunt, paternal aunt, and paternal uncle; Cirrhosis in her mother; Colon polyps in her sister; Endometrial cancer (age of onset: 53) in her daughter; Heart disease in her father; Heart failure in her father and paternal grandfather; Hodgkin's lymphoma (age of onset: 56) in an other family member; Hyperlipidemia in her father; Hypertension in her brother; Irritable bowel syndrome in her mother and sister; Leukemia in her cousin; Lung cancer in her maternal uncle; Obesity in her daughter; Stomach cancer in her maternal uncle.She reports that she quit smoking about 11 years ago. Her smoking use included cigarettes. She started smoking about 46 years ago. She has a 99.9 pack-year smoking history. She has never used smokeless tobacco. She reports that she does not drink alcohol and does not use drugs.  Current Outpatient Medications on File Prior to Visit  Medication Sig Dispense Refill   acetaminophen  (TYLENOL ) 500 MG tablet Take 500 mg by mouth every 6 (six) hours as needed.     albuterol  (VENTOLIN  HFA) 108 (90 Base) MCG/ACT inhaler Inhale 2 puffs into the lungs every 6 (six) hours as needed for wheezing or shortness of breath. 18 g 0   alendronate  (FOSAMAX ) 70 MG tablet TAKE 1 TABLET BY MOUTH EVERY 7 DAYS. TAKE WITH A FULL GLASS OF WATER ON AN EMPTY STOMACH. 12 tablet 0   Blood Glucose Monitoring Suppl (ONETOUCH VERIO REFLECT) w/Device KIT Test BS twice daily Dx e11.9 1 kit 0   dicyclomine  (BENTYL ) 10 MG capsule Take 1 capsule (10 mg total) by mouth every 8 (eight) hours as needed for spasms. 60 capsule 1   fluocinonide -emollient  (LIDEX -E) 0.05 % cream Apply 1 Application topically 2 (two)  times daily. 60 g 2   fluticasone  (FLONASE ) 50 MCG/ACT nasal spray Place 2 sprays into both nostrils daily. 48 g 1   glucose blood (ONETOUCH VERIO) test strip Test BS twice daily Dx e11.9 200 strip 3   ipratropium-albuterol  (DUONEB) 0.5-2.5 (3) MG/3ML SOLN Take 3 mLs by nebulization every 6 (six) hours as needed. 360 mL 1   Lancets (ONETOUCH DELICA PLUS LANCET33G) MISC Test BS twice daily Dx e11.9 200 each 3   propranolol  (INDERAL ) 10 MG tablet Take 1 tablet (10 mg total) by mouth 2 (two) times daily. 60 tablet 3   No current facility-administered medications on file prior to visit.    ROS Review of Systems  Objective:  BP 110/62   Pulse 69   Temp 98.5 F (36.9 C)   Ht 5' 4 (1.626 m)   Wt 201 lb (91.2 kg)   SpO2 94%   BMI 34.50 kg/m   BP Readings from Last 3 Encounters:  02/07/24 110/62  01/04/24 (!) 121/50  12/21/23 122/60    Wt Readings from Last 3 Encounters:  02/07/24 201 lb (91.2 kg)  01/04/24 200 lb 12.8 oz (91.1 kg)  12/21/23 199 lb (90.3 kg)    Lab Results  Component Value Date   HGBA1C 5.0 09/05/2023   HGBA1C 4.8 05/02/2023   HGBA1C 5.2 05/30/2022    Physical Exam  {Perform Simple Foot Exam  Perform Detailed exam:1} {Insert foot Exam (Optional):30965}   Assessment & Plan:  Diabetic lipidosis (HCC) -     Bayer DCA Hb A1c Waived -     CBC with Differential/Platelet -     CMP14+EGFR -     Lipid panel -     TSH  Aortic atherosclerosis (HCC)  Hyperlipidemia, unspecified hyperlipidemia type -     Lipid panel  Vitamin D  deficiency  Fatigue, unspecified type -     Magnesium  -     Phosphorus -     CBC with Differential/Platelet -     CMP14+EGFR -     Lipid panel -     TSH    Follow-up: No follow-ups on file.  Roise Cleaver, M.D.

## 2024-02-08 ENCOUNTER — Ambulatory Visit: Admitting: Emergency Medicine

## 2024-02-08 ENCOUNTER — Encounter: Payer: Self-pay | Admitting: Emergency Medicine

## 2024-02-08 VITALS — BP 117/64 | HR 73 | Ht 64.0 in | Wt 200.0 lb

## 2024-02-08 DIAGNOSIS — R0609 Other forms of dyspnea: Secondary | ICD-10-CM | POA: Diagnosis not present

## 2024-02-08 DIAGNOSIS — J432 Centrilobular emphysema: Secondary | ICD-10-CM | POA: Diagnosis not present

## 2024-02-08 LAB — CBC WITH DIFFERENTIAL/PLATELET
Basophils Absolute: 0 10*3/uL (ref 0.0–0.2)
Basos: 0 %
EOS (ABSOLUTE): 0.1 10*3/uL (ref 0.0–0.4)
Eos: 1 %
Hematocrit: 43.3 % (ref 34.0–46.6)
Hemoglobin: 14.7 g/dL (ref 11.1–15.9)
Immature Grans (Abs): 0 10*3/uL (ref 0.0–0.1)
Immature Granulocytes: 0 %
Lymphocytes Absolute: 0.9 10*3/uL (ref 0.7–3.1)
Lymphs: 18 %
MCH: 30.9 pg (ref 26.6–33.0)
MCHC: 33.9 g/dL (ref 31.5–35.7)
MCV: 91 fL (ref 79–97)
Monocytes Absolute: 0.3 10*3/uL (ref 0.1–0.9)
Monocytes: 6 %
Neutrophils Absolute: 3.5 10*3/uL (ref 1.4–7.0)
Neutrophils: 75 %
Platelets: 66 10*3/uL — CL (ref 150–450)
RBC: 4.75 x10E6/uL (ref 3.77–5.28)
RDW: 13.6 % (ref 11.7–15.4)
WBC: 4.8 10*3/uL (ref 3.4–10.8)

## 2024-02-08 LAB — CMP14+EGFR
ALT: 24 IU/L (ref 0–32)
AST: 37 IU/L (ref 0–40)
Albumin: 3.7 g/dL — ABNORMAL LOW (ref 3.9–4.9)
Alkaline Phosphatase: 98 IU/L (ref 44–121)
BUN/Creatinine Ratio: 20 (ref 12–28)
BUN: 11 mg/dL (ref 8–27)
Bilirubin Total: 2.3 mg/dL — ABNORMAL HIGH (ref 0.0–1.2)
CO2: 23 mmol/L (ref 20–29)
Calcium: 8.7 mg/dL (ref 8.7–10.3)
Chloride: 109 mmol/L — ABNORMAL HIGH (ref 96–106)
Creatinine, Ser: 0.55 mg/dL — ABNORMAL LOW (ref 0.57–1.00)
Globulin, Total: 2.2 g/dL (ref 1.5–4.5)
Glucose: 94 mg/dL (ref 70–99)
Potassium: 4.1 mmol/L (ref 3.5–5.2)
Sodium: 145 mmol/L — ABNORMAL HIGH (ref 134–144)
Total Protein: 5.9 g/dL — ABNORMAL LOW (ref 6.0–8.5)
eGFR: 103 mL/min/{1.73_m2} (ref >59)

## 2024-02-08 LAB — LIPID PANEL
Chol/HDL Ratio: 2.3 ratio (ref 0.0–4.4)
Cholesterol, Total: 146 mg/dL (ref 100–199)
HDL: 63 mg/dL (ref >39)
LDL Chol Calc (NIH): 67 mg/dL (ref 0–99)
Triglycerides: 87 mg/dL (ref 0–149)
VLDL Cholesterol Cal: 16 mg/dL (ref 5–40)

## 2024-02-08 LAB — PHOSPHORUS: Phosphorus: 2.4 mg/dL — ABNORMAL LOW (ref 3.0–4.3)

## 2024-02-08 LAB — MAGNESIUM: Magnesium: 1.8 mg/dL (ref 1.6–2.3)

## 2024-02-08 LAB — BAYER DCA HB A1C WAIVED: HB A1C (BAYER DCA - WAIVED): 5.5 % (ref 4.8–5.6)

## 2024-02-08 LAB — TSH: TSH: 0.931 u[IU]/mL (ref 0.450–4.500)

## 2024-02-08 MED ORDER — STIOLTO RESPIMAT 2.5-2.5 MCG/ACT IN AERS: 2.0000 | INHALATION_SPRAY | Freq: Every day | RESPIRATORY_TRACT | 11 refills | Status: AC

## 2024-02-08 NOTE — Assessment & Plan Note (Addendum)
 Currently untreated.  She does use some albuterol  as needed.  I will start her on Stiolto to see if she gets benefit.  If so we will continue it going forward.  She is worried about cost.  Also will need to ensure this does not contribute to her palpitations which have given her trouble in the past.  Recheck full PFT to compare with 2016

## 2024-02-08 NOTE — Assessment & Plan Note (Addendum)
 Multifactorial shortness of breath with PFTs and history consistent with some COPD, also deconditioning, probable impact of restrictive disease from obesity.  She has untreated pulmonary embolism and untreated OSA which puts her at risk for secondary pulmonary hypertension although none seen on her TTE from 01/2022.  Optimally we would treat all contributing factors including obstructive lung disease, her OSA (with BiPAP), chronic anticoagulation (stopped due to GI blood loss).  Also any potential cardiac causes although she has had a reassuring cardiac evaluation and does follow with cardiology.  She is not interested in a repeat sleep study or BiPAP at this time.  She does not believe she is a good candidate to be back on anticoagulation and this may be the case.  Would have to discuss this with gastroenterology.  For now I will repeat her PFT, quantify her degree of obstruction and try her on bronchodilator therapy.  We will have to ensure that she does not develop tachycardia, PACs/PVCs that might contribute to symptoms.  Finally check walking oximetry today to ensure she does not have occult desaturation.

## 2024-02-08 NOTE — Patient Instructions (Signed)
 VISIT SUMMARY:  Today, you were seen for shortness of breath, which has been worsening over the past five years, especially during physical activities. We discussed your history of COPD, severe obstructive sleep apnea, and past pulmonary embolism events. We reviewed your current symptoms and past medical history to develop a plan to improve your breathing and overall health.  YOUR PLAN:  -CHRONIC OBSTRUCTIVE PULMONARY DISEASE (COPD): COPD is a chronic lung disease that makes it hard to breathe due to long-term damage to the lungs, often from smoking. We will start you on a daily inhaler to help improve your breathing and ability to perform daily activities. We will also repeat your pulmonary function tests to see how your lung function has changed since 2016 and conduct a walk test to evaluate your oxygen  levels and how well you can handle physical activity.  -OBSTRUCTIVE SLEEP APNEA (OSA): OSA is a condition where your breathing repeatedly stops and starts during sleep due to blocked airways. Your sleep study showed severe OSA, and you had difficulty tolerating CPAP and BiPAP machines in the past. We discussed trying different mask options if you decide to reconsider using CPAP or BiPAP.  -OBESITY: Obesity can make it harder to breathe and can worsen your COPD symptoms by affecting your diaphragm and lung expansion. We discussed the importance of weight management to help improve your breathing and overall health.  -PULMONARY EMBOLISM: A pulmonary embolism is a blockage in one of the pulmonary arteries in your lungs, often caused by blood clots that travel from the legs. You had significant events in the past, likely related to smoking and hormone use. We discussed the need to balance the risk of another embolism with the risk of bleeding, given your history of GI bleeds.  INSTRUCTIONS:  Please start using the prescribed daily inhaler as directed. We will schedule repeat pulmonary function tests and  a walk test to assess your current lung function and oxygen  levels. If you are open to it, consider trying different mask options for CPAP or BiPAP to manage your sleep apnea. Focus on weight management strategies to help improve your breathing and overall health. Follow up with us  as scheduled for further evaluation and management.

## 2024-02-08 NOTE — Progress Notes (Signed)
 Subjective:    Patient ID: Kimberly Obrien, female    DOB: 09/25/1960, 63 y.o.   MRN: [id number]  HPI Kimberly Obrien is a 63 year old female with COPD and severe OSA who presents with shortness of breath.  She has experienced shortness of breath for several years, worsening over the past five years. The shortness of breath is primarily exertional, occurring during activities such as walking long distances, climbing stairs, or bending over. She needs to stop and rest when walking far, even on flat surfaces. No chest pain or wheezing is present, but there is occasional coughing when short of breath. She does not experience shortness of breath while sitting still.  Her sleep is poor, requiring her to be propped up to sleep. She has severe obstructive sleep apnea, confirmed by a sleep study in September 2017 with an AHI of 38 per hour. Initial treatment with BiPAP was intolerable due to throat swelling and discomfort, leading to discontinuation. She has not attempted BiPAP use since then.  She has a significant smoking history of 100 pack years but quit smoking in 2013. She has a history of pulmonary embolism, with the first major event occurring approximately 20 years ago, attributed to smoking and birth control use. A second event involved three smaller emboli, possibly related to hormone-containing diet medication. She is not currently on anticoagulation due to a GI bleed, which required blood transfusions.  Her past medical history includes diabetes, GERD, hyperlipidemia, and NASH. She is not on any scheduled bronchodilator therapy but uses albuterol  as needed, which provides relief during episodes of shortness of breath. She has Duoneb available but uses it less frequently. Pulmonary function testing from 2016 showed no bronchodilator response, a decreased FEV1, decreased diffusion capacity, and lung volumes confirming restriction. An echocardiogram from June 2023 showed normal LVEF and RV  size and function, with inadequate tricuspid regurgitation signal for PA pressure measurement.   DIAGNOSTIC Pulmonary function testing: Mixed obstruction and restriction without bronchodilator response. FEV1 moderately severely decreased. Lung volumes confirm restriction. Diffusion capacity decreased and does not fully correct when adjusted for alveolar volume. (2016)  Sleep study: Obstructive sleep apnea with AHI of 38 per hour. Corrected with BiPAP 12/8. Central apneas while on CPAP. (04/2016)  Echocardiogram: Normal LVEF 65-70%. Normal diastolic parameters. Normal RV size and function. Inadequate tricuspid regurgitation signal for measuring PA pressures. (01/25/2022)  Lung cancer screening CT chest 10/26/2023 was a RADS 2 study, showed moderate to severe emphysema with bronchial wall thickening, multiple small scattered nodules that were stable from priors, up to 7 mm.  No suspicious nodules seen.  Review of Systems As per HPI  Past Medical History:  Diagnosis Date   Anemia    Anticoagulation monitoring by pharmacist    Arthritis    Asthma    Blood transfusion without reported diagnosis    Cataract    removed both eyes   Cirrhosis (HCC)    COPD (chronic obstructive pulmonary disease) (HCC)    Diabetes mellitus without complication (HCC)    Dyspnea    exertion   Elevated hemoglobin A1c 03/01/2018   Family history of Hodgkin's lymphoma    Family history of leukemia    Family history of lung cancer    Family history of stomach cancer    Family history of uterine cancer    GERD (gastroesophageal reflux disease)    mild- diet related   Heart murmur    History of colon polyps    History of pulmonary  embolism 02/04/2013   Hyperlipidemia    controlled   NASH (nonalcoholic steatohepatitis)    Neuromuscular disorder (HCC)    back surgery   PE (pulmonary embolism)    Routine Papanicolaou smear 09/12/2021   Sleep apnea    no cpap   Ureteral calculus, left 07/28/2021   UTI  symptoms 08/18/2021     Family History  Problem Relation Age of Onset   Cirrhosis Mother        NASH   Irritable bowel syndrome Mother    Hyperlipidemia Father    Heart failure Father    Bipolar disorder Father    Heart disease Father    Colon polyps Sister        unknown number but fewer than patient   Irritable bowel syndrome Sister    Obesity Daughter    Endometrial cancer Daughter 55   Stomach cancer Maternal Uncle        dx 40s/50s   Lung cancer Maternal Uncle        dx 40s/50s, smoker   Cancer Maternal Uncle        NOS   Cancer Paternal Aunt        NOS   Cancer Paternal Aunt        NOS   Cancer Paternal Uncle        NOS   Heart failure Paternal Grandfather    Leukemia Cousin        dx 2s, died 2 weeks after diagnosis (maternal first cousin)   Cancer Cousin        NOS (paternal first cousin)   Hypertension Brother    Hodgkin's lymphoma Other 14       4th degree relative (mother's half-sibling's son)   Heart attack Neg Hx    Miscarriages / Stillbirths Neg Hx    Colon cancer Neg Hx    Esophageal cancer Neg Hx    Rectal cancer Neg Hx    Breast cancer Neg Hx      Social History   Socioeconomic History   Marital status: Married    Spouse name: Myrtie Atkinson   Number of children: 4   Years of education: GED   Highest education level: GED or equivalent  Occupational History   Occupation: Unemployed  Tobacco Use   Smoking status: Former    Current packs/day: 0.00    Average packs/day: 2.0 packs/day for 50.0 years (99.9 ttl pk-yrs)    Types: Cigarettes    Start date: 08/22/1977    Quit date: 04/21/2012    Years since quitting: 11.8   Smokeless tobacco: Never  Vaping Use   Vaping status: Never Used  Substance and Sexual Activity   Alcohol use: No   Drug use: No   Sexual activity: Not Currently    Birth control/protection: Surgical    Comment: tubal and [facility]  Other Topics Concern   Not on file  Social History Narrative   Not on file   Social Drivers of  Health   Financial Resource Strain: Medium Risk (02/05/2024)   Overall Financial Resource Strain (CARDIA)    Difficulty of Paying Living Expenses: Somewhat hard  Food Insecurity: No Food Insecurity (02/05/2024)   Hunger Vital Sign    Worried About Running Out of Food in the Last Year: Never true    Ran Out of Food in the Last Year: Never true  Transportation Needs: No Transportation Needs (02/05/2024)   PRAPARE - Administrator, Civil Service (Medical): No  Lack of Transportation (Non-Medical): No  Physical Activity: Insufficiently Active (02/05/2024)   Exercise Vital Sign    Days of Exercise per Week: 1 day    Minutes of Exercise per Session: 10 min  Stress: Stress Concern Present (02/05/2024)   [employer] of Occupational Health - Occupational Stress Questionnaire    Feeling of Stress: To some extent  Social Connections: Moderately Isolated (02/05/2024)   Social Connection and Isolation Panel    Frequency of Communication with Friends and Family: Three times a week    Frequency of Social Gatherings with Friends and Family: Patient declined    Attends Religious Services: Patient declined    Active Member of Clubs or Organizations: No    Attends Engineer, structural: Not on file    Marital Status: Married  Catering manager Violence: Not At Risk (10/18/2023)   Humiliation, Afraid, Rape, and Kick questionnaire    Fear of Current or Ex-Partner: No    Emotionally Abused: No    Physically Abused: No    Sexually Abused: No     Allergies  Allergen Reactions   Aciphex [Rabeprazole Sodium] Rash   Fluconazole Rash and Hives   Lansoprazole Hives   Septra [Sulfamethoxazole-Trimethoprim] Nausea And Vomiting    made me bleed   Sucralfate  Nausea Only   Macrobid  [Nitrofurantoin ] Diarrhea   Vancomycin  Diarrhea   Claritin [Loratadine] Other (See Comments)    Tired    Crestor  [Rosuvastatin ] Other (See Comments)    Elevated blood glucose and abdominal pain    Doxycycline Rash   Ibuprofen Other (See Comments)    Upset stomach   Lipitor [Atorvastatin] Other (See Comments)    Myalgias, leg pain.  Problems with pancreas, sugars went up also.   Metoprolol  Nausea And Vomiting   Metronidazole  Other (See Comments) and Nausea Only    Dizziness, nausea, dry mouth and some shortness of breath   Nsaids Other (See Comments)    Upset stomach   Omeprazole      bloating   Rabeprazole Rash     Outpatient Medications Prior to Visit  Medication Sig Dispense Refill   acetaminophen  (TYLENOL ) 500 MG tablet Take 500 mg by mouth every 6 (six) hours as needed.     albuterol  (VENTOLIN  HFA) 108 (90 Base) MCG/ACT inhaler Inhale 2 puffs into the lungs every 6 (six) hours as needed for wheezing or shortness of breath. 18 g 0   alendronate  (FOSAMAX ) 70 MG tablet TAKE 1 TABLET BY MOUTH EVERY 7 DAYS. TAKE WITH A FULL GLASS OF WATER ON AN EMPTY STOMACH. 12 tablet 0   amoxicillin -clavulanate (AUGMENTIN ) 875-125 MG tablet Take 1 tablet by mouth 2 (two) times daily. Take all of this medication 20 tablet 0   Blood Glucose Monitoring Suppl (ONETOUCH VERIO REFLECT) w/Device KIT Test BS twice daily Dx e11.9 1 kit 0   dicyclomine  (BENTYL ) 10 MG capsule Take 1 capsule (10 mg total) by mouth every 8 (eight) hours as needed for spasms. 60 capsule 1   fexofenadine -pseudoephedrine (ALLEGRA -D 24) 180-240 MG 24 hr tablet Take 1 tablet by mouth every evening. For allergy and congestion 30 tablet 11   fluocinonide -emollient (LIDEX -E) 0.05 % cream Apply 1 Application topically 2 (two) times daily. 60 g 2   fluticasone  (FLONASE ) 50 MCG/ACT nasal spray Place 2 sprays into both nostrils daily. 48 g 1   glucose blood (ONETOUCH VERIO) test strip Test BS twice daily Dx e11.9 200 strip 3   ipratropium-albuterol  (DUONEB) 0.5-2.5 (3) MG/3ML SOLN Take 3 mLs by nebulization  every 6 (six) hours as needed. 360 mL 1   Lancets (ONETOUCH DELICA PLUS LANCET33G) MISC Test BS twice daily Dx e11.9 200 each 3    propranolol  (INDERAL ) 10 MG tablet Take 1 tablet (10 mg total) by mouth 2 (two) times daily. 60 tablet 3   No facility-administered medications prior to visit.        Objective:   Physical Exam Vitals:   02/08/24 0919  BP: 117/64  Pulse: 73  SpO2: 93%  Weight: 200 lb (90.7 kg)  Height: 5' 4 (1.626 m)   Gen: Pleasant, overweight woman, in no distress,  normal affect  ENT: No lesions,  mouth clear,  oropharynx clear, no postnasal drip  Neck: No JVD, no stridor  Lungs: No use of accessory muscles, decreased at both bases but no wheezing or crackles  Cardiovascular: RRR, heart sounds normal, no murmur or gallops, no peripheral edema  Musculoskeletal: No deformities, no cyanosis or clubbing  Neuro: alert, awake, non focal  Skin: Warm, no lesions or rash      Assessment & Plan:   DOE (dyspnea on exertion) Multifactorial shortness of breath with PFTs and history consistent with some COPD, also deconditioning, probable impact of restrictive disease from obesity.  She has untreated pulmonary embolism and untreated OSA which puts her at risk for secondary pulmonary hypertension although none seen on her TTE from 01/2022.  Optimally we would treat all contributing factors including obstructive lung disease, her OSA (with BiPAP), chronic anticoagulation (stopped due to GI blood loss).  Also any potential cardiac causes although she has had a reassuring cardiac evaluation and does follow with cardiology.  She is not interested in a repeat sleep study or BiPAP at this time.  She does not believe she is a good candidate to be back on anticoagulation and this may be the case.  Would have to discuss this with gastroenterology.  For now I will repeat her PFT, quantify her degree of obstruction and try her on bronchodilator therapy.  We will have to ensure that she does not develop tachycardia, PACs/PVCs that might contribute to symptoms.  Finally check walking oximetry today to ensure she does  not have occult desaturation.  COPD (chronic obstructive pulmonary disease) (HCC) Currently untreated.  She does use some albuterol  as needed.  I will start her on Stiolto to see if she gets benefit.  If so we will continue it going forward.  She is worried about cost.  Also will need to ensure this does not contribute to her palpitations which have given her trouble in the past.  Recheck full PFT to compare with 2016    Racheal Buddle, MD, PhD 02/08/2024, 10:03 AM [facility] [phone] or if no answer before 7:00PM call [phone] For any issues after 7:00PM please call eLink [phone]

## 2024-02-19 ENCOUNTER — Ambulatory Visit: Payer: Self-pay | Admitting: Family Medicine

## 2024-02-21 ENCOUNTER — Encounter: Payer: Self-pay | Admitting: Gastroenterology

## 2024-02-29 ENCOUNTER — Encounter: Payer: Self-pay | Admitting: Gastroenterology

## 2024-02-29 ENCOUNTER — Ambulatory Visit (AMBULATORY_SURGERY_CENTER): Admitting: Gastroenterology

## 2024-02-29 ENCOUNTER — Other Ambulatory Visit (INDEPENDENT_AMBULATORY_CARE_PROVIDER_SITE_OTHER)

## 2024-02-29 VITALS — BP 106/58 | HR 77 | Temp 97.2°F | Resp 16 | Ht 64.0 in | Wt 199.0 lb

## 2024-02-29 DIAGNOSIS — K449 Diaphragmatic hernia without obstruction or gangrene: Secondary | ICD-10-CM

## 2024-02-29 DIAGNOSIS — K7460 Unspecified cirrhosis of liver: Secondary | ICD-10-CM

## 2024-02-29 DIAGNOSIS — I8500 Esophageal varices without bleeding: Secondary | ICD-10-CM | POA: Diagnosis not present

## 2024-02-29 DIAGNOSIS — K3189 Other diseases of stomach and duodenum: Secondary | ICD-10-CM | POA: Diagnosis not present

## 2024-02-29 DIAGNOSIS — K31819 Angiodysplasia of stomach and duodenum without bleeding: Secondary | ICD-10-CM | POA: Diagnosis not present

## 2024-02-29 DIAGNOSIS — G4730 Sleep apnea, unspecified: Secondary | ICD-10-CM | POA: Diagnosis not present

## 2024-02-29 DIAGNOSIS — E119 Type 2 diabetes mellitus without complications: Secondary | ICD-10-CM | POA: Diagnosis not present

## 2024-02-29 DIAGNOSIS — K766 Portal hypertension: Secondary | ICD-10-CM

## 2024-02-29 LAB — PROTIME-INR
INR: 1.5 ratio — ABNORMAL HIGH (ref 0.8–1.0)
Prothrombin Time: 15.6 s — ABNORMAL HIGH (ref 9.6–13.1)

## 2024-02-29 MED ORDER — SODIUM CHLORIDE 0.9 % IV SOLN: 500.0000 mL | INTRAVENOUS | Status: DC

## 2024-02-29 NOTE — Patient Instructions (Addendum)
 Office will call you to schedule EGD for band ligation of varices    YOU HAD AN ENDOSCOPIC PROCEDURE TODAY AT THE [facility]:   Refer to the procedure report that was given to you for any specific questions about what was found during the examination.  If the procedure report does not answer your questions, please call your gastroenterologist to clarify.  If you requested that your care partner not be given the details of your procedure findings, then the procedure report has been included in a sealed envelope for you to review at your convenience later.  YOU SHOULD EXPECT: Some feelings of bloating in the abdomen. Passage of more gas than usual.  Walking can help get rid of the air that was put into your GI tract during the procedure and reduce the bloating. If you had a lower endoscopy (such as a colonoscopy or flexible sigmoidoscopy) you may notice spotting of blood in your stool or on the toilet paper. If you underwent a bowel prep for your procedure, you may not have a normal bowel movement for a few days.  Please Note:  You might notice some irritation and congestion in your nose or some drainage.  This is from the oxygen  used during your procedure.  There is no need for concern and it should clear up in a day or so.  SYMPTOMS TO REPORT IMMEDIATELY:  Following upper endoscopy (EGD)  Vomiting of blood or coffee ground material  New chest pain or pain under the shoulder blades  Painful or persistently difficult swallowing  New shortness of breath  Fever of 100F or higher  Black, tarry-looking stools  For urgent or emergent issues, a gastroenterologist can be reached at any hour by calling [phone]. Do not use MyChart messaging for urgent concerns.    DIET:  We do recommend a small meal at first, but then you may proceed to your regular diet.  Drink plenty of fluids but you should avoid alcoholic beverages for 24 hours.  ACTIVITY:  You should plan to take it easy for  the rest of today and you should NOT DRIVE or use heavy machinery until tomorrow (because of the sedation medicines used during the test).    FOLLOW UP: Our staff will call the number listed on your records the next business day following your procedure.  We will call around 7:15- 8:00 am to check on you and address any questions or concerns that you may have regarding the information given to you following your procedure. If we do not reach you, we will leave a message.     If any biopsies were taken you will be contacted by phone or by letter within the next 1-3 weeks.  Please call us  at [phone] if you have not heard about the biopsies in 3 weeks.    SIGNATURES/CONFIDENTIALITY: You and/or your care partner have signed paperwork which will be entered into your electronic medical record.  These signatures attest to the fact that that the information above on your After Visit Summary has been reviewed and is understood.  Full responsibility of the confidentiality of this discharge information lies with you and/or your care-partner.

## 2024-02-29 NOTE — Progress Notes (Signed)
 Sedate, gd SR, tolerated procedure well, VSS, report to RN

## 2024-02-29 NOTE — Progress Notes (Signed)
 [facility] History and Physical   Primary Care Physician:  Zollie Lowers, MD   Reason for Procedure:   Cirrhosis - screening for varices  Plan:    EGD     HPI: Kimberly Obrien is a 63 y.o. female  here for EGD to reassess varices. She had moderate varices on EGD one year ago. Placed on Coreg  and propranolol , did not tolerate either due to side effects. Repeat EGD to reassess and see if she warrants banding.    Otherwise feels well without any cardiopulmonary symptoms.   I have discussed risks / benefits of anesthesia and endoscopic procedure with Kimberly Obrien and they wish to proceed with the exams as outlined today.    Past Medical History:  Diagnosis Date   Anemia    Anticoagulation monitoring by pharmacist    Arthritis    Asthma    Blood transfusion without reported diagnosis    Cataract    removed both eyes   Cirrhosis (HCC)    COPD (chronic obstructive pulmonary disease) (HCC)    Diabetes mellitus without complication (HCC)    Dyspnea    exertion   Elevated hemoglobin A1c 03/01/2018   Family history of Hodgkin's lymphoma    Family history of leukemia    Family history of lung cancer    Family history of stomach cancer    Family history of uterine cancer    GERD (gastroesophageal reflux disease)    mild- diet related   Heart murmur    History of colon polyps    History of pulmonary embolism 02/04/2013   Hyperlipidemia    controlled   NASH (nonalcoholic steatohepatitis)    Neuromuscular disorder (HCC)    back surgery   PE (pulmonary embolism)    Routine Papanicolaou smear 09/12/2021   Sleep apnea    no cpap   Ureteral calculus, left 07/28/2021   UTI symptoms 08/18/2021    Past Surgical History:  Procedure Laterality Date   ABDOMINAL HYSTERECTOMY  2009   BREAST BIOPSY Left    CATARACT EXTRACTION, BILATERAL Bilateral    CHOLECYSTECTOMY     COLONOSCOPY WITH PROPOFOL  N/A 05/07/2017   Procedure: COLONOSCOPY WITH PROPOFOL ;   Surgeon: Celestia Agent, MD;  Location: [facility];  Service: Endoscopy;  Laterality: N/A;   ESOPHAGOGASTRODUODENOSCOPY (EGD) WITH PROPOFOL  N/A 05/07/2017   Procedure: ESOPHAGOGASTRODUODENOSCOPY (EGD) WITH PROPOFOL ;  Surgeon: Celestia Agent, MD;  Location: [facility];  Service: Endoscopy;  Laterality: N/A;   INCONTINENCE SURGERY  2009   with hysterectomy   IR TRANSCATHETER BX  03/15/2018   IR VENOGRAM HEPATIC W HEMODYNAMIC EVALUATION  03/15/2018   LEFT AND RIGHT HEART CATHETERIZATION WITH CORONARY ANGIOGRAM N/A 05/13/2014   Procedure: LEFT AND RIGHT HEART CATHETERIZATION WITH CORONARY ANGIOGRAM;  Surgeon: Lonni JONETTA Cash, MD;  Location: [facility] CATH LAB;  Service: Cardiovascular;  Laterality: N/A;   SPINE SURGERY     L5 S1 fusion x4   TUBAL LIGATION     WRIST SURGERY Right    Had surgery twice to shave bone for blood circulation improvement.    Prior to Admission medications   Medication Sig Start Date End Date Taking? Authorizing Provider  acetaminophen  (TYLENOL ) 500 MG tablet Take 500 mg by mouth every 6 (six) hours as needed.   Yes [provider]  Blood Glucose Monitoring Suppl (ONETOUCH VERIO REFLECT) w/Device KIT Test BS twice daily Dx e11.9 05/21/20  Yes Zollie Lowers, MD  glucose blood (ONETOUCH VERIO) test strip Test BS twice daily Dx e11.9 07/09/23  Yes Zollie Lowers, MD  Lancets [facility] DELICA PLUS [place]) MISC Test BS twice daily Dx e11.9 07/09/23  Yes Zollie Lowers, MD  albuterol  (VENTOLIN  HFA) 108 (90 Base) MCG/ACT inhaler Inhale 2 puffs into the lungs every 6 (six) hours as needed for wheezing or shortness of breath. 11/19/22   Lavell Lye A, FNP  alendronate  (FOSAMAX ) 70 MG tablet TAKE 1 TABLET BY MOUTH EVERY 7 DAYS. TAKE WITH A FULL GLASS OF WATER ON AN EMPTY STOMACH. 01/10/24   Zollie Lowers, MD  dicyclomine  (BENTYL ) 10 MG capsule Take 1 capsule (10 mg total) by mouth every 8 (eight) hours as needed for spasms. 12/21/23   Jacy Howat, Elspeth SQUIBB, MD   fexofenadine -pseudoephedrine (ALLEGRA -D 24) 180-240 MG 24 hr tablet Take 1 tablet by mouth every evening. For allergy and congestion Patient not taking: Reported on 02/29/2024 02/07/24   Zollie Lowers, MD  fluocinonide -emollient (LIDEX -E) 0.05 % cream Apply 1 Application topically 2 (two) times daily. 05/02/23   Zollie Lowers, MD  fluticasone  (FLONASE ) 50 MCG/ACT nasal spray Place 2 sprays into both nostrils daily. 12/12/22   Zollie Lowers, MD  ipratropium-albuterol  (DUONEB) 0.5-2.5 (3) MG/3ML SOLN Take 3 mLs by nebulization every 6 (six) hours as needed. 11/29/22   Zollie Lowers, MD  Tiotropium Bromide-Olodaterol (STIOLTO RESPIMAT ) 2.5-2.5 MCG/ACT AERS Inhale 2 puffs into the lungs daily. 02/08/24   Shelah Lamar RAMAN, MD    Current Outpatient Medications  Medication Sig Dispense Refill   acetaminophen  (TYLENOL ) 500 MG tablet Take 500 mg by mouth every 6 (six) hours as needed.     Blood Glucose Monitoring Suppl (ONETOUCH VERIO REFLECT) w/Device KIT Test BS twice daily Dx e11.9 1 kit 0   glucose blood (ONETOUCH VERIO) test strip Test BS twice daily Dx e11.9 200 strip 3   Lancets (ONETOUCH DELICA PLUS LANCET33G) MISC Test BS twice daily Dx e11.9 200 each 3   albuterol  (VENTOLIN  HFA) 108 (90 Base) MCG/ACT inhaler Inhale 2 puffs into the lungs every 6 (six) hours as needed for wheezing or shortness of breath. 18 g 0   alendronate  (FOSAMAX ) 70 MG tablet TAKE 1 TABLET BY MOUTH EVERY 7 DAYS. TAKE WITH A FULL GLASS OF WATER ON AN EMPTY STOMACH. 12 tablet 0   dicyclomine  (BENTYL ) 10 MG capsule Take 1 capsule (10 mg total) by mouth every 8 (eight) hours as needed for spasms. 60 capsule 1   fexofenadine -pseudoephedrine (ALLEGRA -D 24) 180-240 MG 24 hr tablet Take 1 tablet by mouth every evening. For allergy and congestion (Patient not taking: Reported on 02/29/2024) 30 tablet 11   fluocinonide -emollient (LIDEX -E) 0.05 % cream Apply 1 Application topically 2 (two) times daily. 60 g 2   fluticasone  (FLONASE ) 50  MCG/ACT nasal spray Place 2 sprays into both nostrils daily. 48 g 1   ipratropium-albuterol  (DUONEB) 0.5-2.5 (3) MG/3ML SOLN Take 3 mLs by nebulization every 6 (six) hours as needed. 360 mL 1   Tiotropium Bromide-Olodaterol (STIOLTO RESPIMAT ) 2.5-2.5 MCG/ACT AERS Inhale 2 puffs into the lungs daily. 4 g 11   Current Facility-Administered Medications  Medication Dose Route Frequency Provider Last Rate Last Admin   0.9 %  sodium chloride  infusion  500 mL Intravenous Continuous Siara Gorder, Elspeth SQUIBB, MD        Allergies as of 02/29/2024 - Review Complete 02/29/2024  Allergen Reaction Noted   Aciphex [rabeprazole sodium] Rash 12/10/2012   Fluconazole Rash and Hives 12/10/2012   Lansoprazole Hives 08/26/2016   Septra [sulfamethoxazole-trimethoprim] Nausea And Vomiting 12/10/2012   Sucralfate  Nausea Only 03/07/2017  Claritin [loratadine] Other (See Comments) 12/10/2012   Crestor  [rosuvastatin ] Other (See Comments) 01/19/2014   Doxycycline Rash 12/10/2012   Ibuprofen Other (See Comments) 12/10/2012   Lipitor [atorvastatin] Other (See Comments) 03/23/2015   Macrobid  [nitrofurantoin ] Diarrhea 09/19/2021   Metoprolol  Nausea And Vomiting 05/08/2014   Metronidazole  Other (See Comments) and Nausea Only 08/24/2016   Nsaids Other (See Comments) 12/10/2012   Omeprazole   02/23/2016   Rabeprazole Rash    Vancomycin  Diarrhea 09/08/2016    Family History  Problem Relation Age of Onset   Cirrhosis Mother        NASH   Irritable bowel syndrome Mother    Hyperlipidemia Father    Heart failure Father    Bipolar disorder Father    Heart disease Father    Colon polyps Sister        unknown number but fewer than patient   Irritable bowel syndrome Sister    Obesity Daughter    Endometrial cancer Daughter 44   Stomach cancer Maternal Uncle        dx 40s/50s   Lung cancer Maternal Uncle        dx 40s/50s, smoker   Cancer Maternal Uncle        NOS   Cancer Paternal Aunt        NOS   Cancer  Paternal Aunt        NOS   Cancer Paternal Uncle        NOS   Heart failure Paternal Grandfather    Leukemia Cousin        dx 19s, died 2 weeks after diagnosis (maternal first cousin)   Cancer Cousin        NOS (paternal first cousin)   Hypertension Brother    Hodgkin's lymphoma Other 14       4th degree relative (mother's half-sibling's son)   Heart attack Neg Hx    Miscarriages / Stillbirths Neg Hx    Colon cancer Neg Hx    Esophageal cancer Neg Hx    Rectal cancer Neg Hx    Breast cancer Neg Hx     Social History   Socioeconomic History   Marital status: Married    Spouse name: Alm   Number of children: 4   Years of education: GED   Highest education level: GED or equivalent  Occupational History   Occupation: Unemployed  Tobacco Use   Smoking status: Former    Current packs/day: 0.00    Average packs/day: 2.0 packs/day for 50.0 years (99.9 ttl pk-yrs)    Types: Cigarettes    Start date: 08/22/1977    Quit date: 04/21/2012    Years since quitting: 11.8   Smokeless tobacco: Never  Vaping Use   Vaping status: Never Used  Substance and Sexual Activity   Alcohol use: No   Drug use: No   Sexual activity: Not Currently    Birth control/protection: Surgical    Comment: tubal and [facility]  Other Topics Concern   Not on file  Social History Narrative   Not on file   Social Drivers of Health   Financial Resource Strain: Medium Risk (02/05/2024)   Overall Financial Resource Strain (CARDIA)    Difficulty of Paying Living Expenses: Somewhat hard  Food Insecurity: No Food Insecurity (02/05/2024)   Hunger Vital Sign    Worried About Running Out of Food in the Last Year: Never true    Ran Out of Food in the Last Year: Never true  Transportation Needs: No  Transportation Needs (02/05/2024)   PRAPARE - Administrator, Civil Service (Medical): No    Lack of Transportation (Non-Medical): No  Physical Activity: Insufficiently Active (02/05/2024)   Exercise Vital Sign     Days of Exercise per Week: 1 day    Minutes of Exercise per Session: 10 min  Stress: Stress Concern Present (02/05/2024)   [employer] of Occupational Health - Occupational Stress Questionnaire    Feeling of Stress: To some extent  Social Connections: Moderately Isolated (02/05/2024)   Social Connection and Isolation Panel    Frequency of Communication with Friends and Family: Three times a week    Frequency of Social Gatherings with Friends and Family: Patient declined    Attends Religious Services: Patient declined    Active Member of Clubs or Organizations: No    Attends Engineer, structural: Not on file    Marital Status: Married  Catering manager Violence: Not At Risk (10/18/2023)   Humiliation, Afraid, Rape, and Kick questionnaire    Fear of Current or Ex-Partner: No    Emotionally Abused: No    Physically Abused: No    Sexually Abused: No    Review of Systems: All other review of systems negative except as mentioned in the HPI.  Physical Exam: Vital signs BP (!) 147/74   Pulse 67   Temp (!) 97.2 F (36.2 C) (Temporal)   Ht 5' 4 (1.626 m)   Wt 199 lb (90.3 kg)   SpO2 95%   BMI 34.16 kg/m   General:   Alert,  Well-developed, pleasant and cooperative in NAD Lungs:  Clear throughout to auscultation.   Heart:  Regular rate and rhythm Abdomen:  Soft, nontender and nondistended.   Neuro/Psych:  Alert and cooperative. Normal mood and affect. A and O x 3  Marcey Naval, MD [facility]

## 2024-02-29 NOTE — Op Note (Signed)
 [facility] Patient Name: Kimberly Obrien Procedure Date: 02/29/2024 3:05 PM MRN: [id number] Endoscopist: Elspeth P. Leigh , MD, [id number] Age: 63 Referring MD:  Date of Birth: Feb 21, 1961 Gender: Female Account #: [id number] Procedure:                Upper GI endoscopy Indications:              Follow-up of esophageal varices - medium sized                            varices 4/24 - placed on Coreg  and propranolol  in                            the past with intolerance. Reassess varices for                            interval growth and determine if banding is                            warranted given intolerance of nonspecific beta                            blockade Medicines:                Monitored Anesthesia Care Procedure:                Pre-Anesthesia Assessment:                           - Prior to the procedure, a History and Physical                            was performed, and patient medications and                            allergies were reviewed. The patient's tolerance of                            previous anesthesia was also reviewed. The risks                            and benefits of the procedure and the sedation                            options and risks were discussed with the patient.                            All questions were answered, and informed consent                            was obtained. Prior Anticoagulants: The patient has                            taken no anticoagulant or antiplatelet agents. ASA  Grade Assessment: III - A patient with severe                            systemic disease. After reviewing the risks and                            benefits, the patient was deemed in satisfactory                            condition to undergo the procedure.                           After obtaining informed consent, the endoscope was                            passed under direct vision. Throughout  the                            procedure, the patient's blood pressure, pulse, and                            oxygen  saturations were monitored continuously. The                            Olympus Scope [id number] was introduced through the                            mouth, and advanced to the second part of duodenum.                            The upper GI endoscopy was accomplished without                            difficulty. The patient tolerated the procedure                            well. Scope In: Scope Out: Findings:                 Esophagogastric landmarks were identified: the                            Z-line was found at 40 cm, the gastroesophageal                            junction was found at 40 cm and the upper extent of                            the gastric folds was found at 41 cm from the                            incisors.                           A 1 cm  hiatal hernia was present.                           Varices were found in the middle third of the                            esophagus and in the lower third of the esophagus.                            They were small to medium in size in the mid                            esophagus and medium to large in the very distal                            esophagus. No red whale signs or stigmata for                            bleeding but appear to be slightly larger than                            previously noted..                           The exam of the esophagus was otherwise normal.                           Moderate portal hypertensive gastropathy was found                            in the stomach. No biopsies taken, previously                            tested negative for H pylori.                           The exam of the stomach was otherwise normal. No                            gastric varices.                           A single diminutive angiodysplastic lesion was                            found in  the second portion of the duodenum.                           The exam of the duodenum was otherwise normal. Complications:            No immediate complications. Estimated blood loss:                            Minimal. Estimated Blood Loss:     Estimated blood  loss was minimal. Impression:               - Esophagogastric landmarks identified.                           - 1 cm hiatal hernia.                           - Esophageal varices - moderate to large in the                            distal esophagus.                           - Portal hypertensive gastropathy.                           - A single angiodysplastic lesion in the duodenum.                           Patient would benefit from variceal band ligation                            to help reduce risk for bleeding given interval                            enlargement of varices and her intolerance to                            nonspecific beta blockade. I will discuss with her. Recommendation:           - Patient has a contact number available for                            emergencies. The signs and symptoms of potential                            delayed complications were discussed with the                            patient. Return to normal activities tomorrow.                            Written discharge instructions were provided to the                            patient.                           - Resume previous diet.                           - Continue present medications.                           - Repeat EGD at the hospital for band ligation of  varices, if she is willing.                           - Go to the lab for INR and AFP (overdue), orders                            placed. Elspeth P. Linkoln Alkire, MD 02/29/2024 3:44:32 PM This report has been signed electronically.

## 2024-03-03 ENCOUNTER — Telehealth: Payer: Self-pay

## 2024-03-03 LAB — AFP TUMOR MARKER: AFP-Tumor Marker: 3.9 ng/mL

## 2024-03-03 NOTE — Telephone Encounter (Signed)
  Follow up Call-     02/29/2024    2:47 PM 12/15/2022    2:43 PM 12/15/2022    2:30 PM 09/06/2021    3:37 PM  Call back number  Post procedure Call Back phone  # [phone]  [phone]  Permission to leave phone message Yes  Yes Yes     Patient questions:  Do you have a fever, pain , or abdominal swelling? No. Pain Score  0 *  Have you tolerated food without any problems? Yes.    Have you been able to return to your normal activities? Yes.    Do you have any questions about your discharge instructions: Diet   No. Medications  No. Follow up visit  No.  Do you have questions or concerns about your Care? No.  Actions: * If pain score is 4 or above: No action needed, pain <4.

## 2024-03-04 ENCOUNTER — Ambulatory Visit: Payer: Self-pay | Admitting: Gastroenterology

## 2024-03-04 ENCOUNTER — Other Ambulatory Visit: Payer: Self-pay

## 2024-03-04 DIAGNOSIS — K7460 Unspecified cirrhosis of liver: Secondary | ICD-10-CM

## 2024-03-04 DIAGNOSIS — I8500 Esophageal varices without bleeding: Secondary | ICD-10-CM

## 2024-03-04 NOTE — Progress Notes (Signed)
 Patient scheduled for EGD with banding of varices on Thursday, 8-14 at 7:30 am.  Patient to arrive at 6am. Instructions sent to patient via MyChart and mailed.  Amb Ref placed.

## 2024-03-11 NOTE — Telephone Encounter (Signed)
 Dr. Leigh has an opening at the hospital this Thursday, 7-24 at 11:00 am.  Called Patient to see if she can move up her EGD currently scheduled for 8-14.  She would need to arrive on Thursday at 9:00am to [facility].  Asked patient to call back to confirm ASAP. (If patient calls back please have her confirm if she can move to Thursday AT 9am at [facility]).

## 2024-03-11 NOTE — Telephone Encounter (Signed)
 Called patient.  She is able to come on Thursday, 7-24 and knows to arrive at 9:30am to [facility].  NPO after 5:30am.  New instructions were sent to her MyChart.  Scheduling is closed at this time, but I will call in the morning to move patient from 8-14 to 7-24.

## 2024-03-13 ENCOUNTER — Ambulatory Visit (HOSPITAL_COMMUNITY)
Admission: RE | Admit: 2024-03-13 | Discharge: 2024-03-13 | Disposition: A | Discharge status: Home / Self Care | Attending: Gastroenterology | Admitting: Gastroenterology

## 2024-03-13 ENCOUNTER — Encounter (HOSPITAL_COMMUNITY): Payer: Self-pay | Admitting: Gastroenterology

## 2024-03-13 ENCOUNTER — Ambulatory Visit (HOSPITAL_COMMUNITY): Admitting: Anesthesiology

## 2024-03-13 ENCOUNTER — Encounter (HOSPITAL_COMMUNITY)
Admission: RE | Disposition: A | Discharge status: Home / Self Care | Payer: Self-pay | Source: Home / Self Care | Attending: Gastroenterology

## 2024-03-13 ENCOUNTER — Other Ambulatory Visit: Payer: Self-pay

## 2024-03-13 ENCOUNTER — Other Ambulatory Visit: Payer: Self-pay | Admitting: *Deleted

## 2024-03-13 DIAGNOSIS — Z87891 Personal history of nicotine dependence: Secondary | ICD-10-CM | POA: Diagnosis not present

## 2024-03-13 DIAGNOSIS — K3189 Other diseases of stomach and duodenum: Secondary | ICD-10-CM | POA: Insufficient documentation

## 2024-03-13 DIAGNOSIS — I8510 Secondary esophageal varices without bleeding: Secondary | ICD-10-CM | POA: Diagnosis not present

## 2024-03-13 DIAGNOSIS — K449 Diaphragmatic hernia without obstruction or gangrene: Secondary | ICD-10-CM | POA: Diagnosis not present

## 2024-03-13 DIAGNOSIS — K766 Portal hypertension: Secondary | ICD-10-CM

## 2024-03-13 DIAGNOSIS — I8500 Esophageal varices without bleeding: Secondary | ICD-10-CM

## 2024-03-13 DIAGNOSIS — K31819 Angiodysplasia of stomach and duodenum without bleeding: Secondary | ICD-10-CM | POA: Insufficient documentation

## 2024-03-13 DIAGNOSIS — G4733 Obstructive sleep apnea (adult) (pediatric): Secondary | ICD-10-CM | POA: Diagnosis not present

## 2024-03-13 DIAGNOSIS — J4489 Other specified chronic obstructive pulmonary disease: Secondary | ICD-10-CM | POA: Diagnosis not present

## 2024-03-13 DIAGNOSIS — K7460 Unspecified cirrhosis of liver: Secondary | ICD-10-CM

## 2024-03-13 DIAGNOSIS — E119 Type 2 diabetes mellitus without complications: Secondary | ICD-10-CM | POA: Insufficient documentation

## 2024-03-13 DIAGNOSIS — J449 Chronic obstructive pulmonary disease, unspecified: Secondary | ICD-10-CM | POA: Diagnosis not present

## 2024-03-13 DIAGNOSIS — G4730 Sleep apnea, unspecified: Secondary | ICD-10-CM | POA: Insufficient documentation

## 2024-03-13 HISTORY — PX: ESOPHAGOGASTRODUODENOSCOPY: SHX5428

## 2024-03-13 HISTORY — PX: ESOPHAGEAL BANDING: SHX5518

## 2024-03-13 SURGERY — EGD (ESOPHAGOGASTRODUODENOSCOPY)
Anesthesia: Monitor Anesthesia Care

## 2024-03-13 MED ORDER — ACCU-CHEK GUIDE ME W/DEVICE KIT: PACK | 0 refills | Status: AC

## 2024-03-13 MED ORDER — ACCU-CHEK SOFTCLIX LANCETS MISC: 3 refills | Status: AC

## 2024-03-13 MED ORDER — FENTANYL CITRATE (PF) 100 MCG/2ML IJ SOLN
INTRAMUSCULAR | Status: AC
Start: 2024-03-13 — End: 2024-03-13
  Filled 2024-03-13: qty 2

## 2024-03-13 MED ORDER — PROPOFOL 500 MG/50ML IV EMUL
INTRAVENOUS | Status: DC | PRN
  Administered 2024-03-13: 175 ug/kg/min via INTRAVENOUS

## 2024-03-13 MED ORDER — LIDOCAINE 2% (20 MG/ML) 5 ML SYRINGE
INTRAMUSCULAR | Status: DC | PRN
Start: 2024-03-13 — End: 2024-03-13
  Administered 2024-03-13: 50 mg via INTRAVENOUS

## 2024-03-13 MED ORDER — ACCU-CHEK GUIDE TEST VI STRP: ORAL_STRIP | 3 refills | Status: AC

## 2024-03-13 MED ORDER — PROPOFOL 10 MG/ML IV BOLUS
INTRAVENOUS | Status: DC | PRN
  Administered 2024-03-13: 40 mg via INTRAVENOUS

## 2024-03-13 MED ORDER — FENTANYL CITRATE (PF) 100 MCG/2ML IJ SOLN
25.0000 ug | Freq: Once | INTRAMUSCULAR | Status: AC
  Administered 2024-03-13: 25 ug via INTRAVENOUS

## 2024-03-13 MED ORDER — SODIUM CHLORIDE 0.9 % IV SOLN: INTRAVENOUS | Status: DC

## 2024-03-13 MED ORDER — OXYCODONE HCL 5 MG PO TABS: 5.0000 mg | ORAL_TABLET | ORAL | 0 refills | Status: AC | PRN

## 2024-03-13 NOTE — Transfer of Care (Signed)
 Immediate Anesthesia Transfer of Care Note  Patient: Kimberly Obrien  Procedure(s) Performed: EGD (ESOPHAGOGASTRODUODENOSCOPY) ESOPHAGOSCOPY, WITH ESOPHAGEAL VARICES BAND LIGATION  Patient Location: Endoscopy Unit  Anesthesia Type:MAC  Level of Consciousness: drowsy  Airway & Oxygen  Therapy: Patient Spontanous Breathing and Patient connected to face mask  Post-op Assessment: Report given to RN and Post -op Vital signs reviewed and stable  Post vital signs: Reviewed and stable  Last Vitals:  Vitals Value Taken Time  BP    Temp    Pulse    Resp    SpO2      Last Pain:  Vitals:   03/13/24 0939  TempSrc: Temporal  PainSc: 0-No pain      Patients Stated Pain Goal: 0 (03/13/24 0939)  Complications: No notable events documented.

## 2024-03-13 NOTE — Anesthesia Procedure Notes (Signed)
 Procedure Name: MAC Date/Time: 03/13/2024 11:00 AM  Performed by: Judythe Tanda Aran, CRNAPre-anesthesia Checklist: Patient identified, Emergency Drugs available, Suction available and Patient being monitored Patient Re-evaluated:Patient Re-evaluated prior to induction Oxygen  Delivery Method: Simple face mask

## 2024-03-13 NOTE — H&P (Signed)
 [facility] History and Physical   Primary Care Physician:  Zollie Lowers, MD   Reason for Procedure:   Esophageal varices  Plan:    EGD with banding of varices     HPI: Kimberly Obrien is a 63 y.o. female  here for EGD to band esophageal varices. Recently had EGD in the office. Varices appear to be enlarging over time, had moderate to large varices in the distal esophagus. She has not tolerated 2 forms of nonspecific beta blockade, thus using EGD to band varices and prevent future bleeding. I have discussed risks of this with her, including post banding bleeding, chest pain, etc. She understands and wishes to proceed. Otherwise feels well without any cardiopulmonary symptoms.   I have discussed risks / benefits of anesthesia and endoscopic procedure with [name] and they wish to proceed with the exams as outlined today.    Past Medical History:  Diagnosis Date   Anemia    Anticoagulation monitoring by pharmacist    Arthritis    Asthma    Blood transfusion without reported diagnosis    Cataract    removed both eyes   Cirrhosis (HCC)    COPD (chronic obstructive pulmonary disease) (HCC)    Diabetes mellitus without complication (HCC)    Dyspnea    exertion   Elevated hemoglobin A1c 03/01/2018   Family history of Hodgkin's lymphoma    Family history of leukemia    Family history of lung cancer    Family history of stomach cancer    Family history of uterine cancer    GERD (gastroesophageal reflux disease)    mild- diet related   Heart murmur    History of colon polyps    History of pulmonary embolism 02/04/2013   Hyperlipidemia    controlled   NASH (nonalcoholic steatohepatitis)    Neuromuscular disorder (HCC)    back surgery   PE (pulmonary embolism)    Routine Papanicolaou smear 09/12/2021   Sleep apnea    no cpap   Ureteral calculus, left 07/28/2021   UTI symptoms 08/18/2021    Past Surgical History:  Procedure Laterality Date    ABDOMINAL HYSTERECTOMY  2009   BREAST BIOPSY Left    CATARACT EXTRACTION, BILATERAL Bilateral    CHOLECYSTECTOMY     COLONOSCOPY WITH PROPOFOL  N/A 05/07/2017   Procedure: COLONOSCOPY WITH PROPOFOL ;  Surgeon: Celestia Agent, MD;  Location: [facility];  Service: Endoscopy;  Laterality: N/A;   ESOPHAGOGASTRODUODENOSCOPY (EGD) WITH PROPOFOL  N/A 05/07/2017   Procedure: ESOPHAGOGASTRODUODENOSCOPY (EGD) WITH PROPOFOL ;  Surgeon: Celestia Agent, MD;  Location: [facility];  Service: Endoscopy;  Laterality: N/A;   INCONTINENCE SURGERY  2009   with hysterectomy   IR TRANSCATHETER BX  03/15/2018   IR VENOGRAM HEPATIC W HEMODYNAMIC EVALUATION  03/15/2018   LEFT AND RIGHT HEART CATHETERIZATION WITH CORONARY ANGIOGRAM N/A 05/13/2014   Procedure: LEFT AND RIGHT HEART CATHETERIZATION WITH CORONARY ANGIOGRAM;  Surgeon: Lonni JONETTA Cash, MD;  Location: [facility] CATH LAB;  Service: Cardiovascular;  Laterality: N/A;   SPINE SURGERY     L5 S1 fusion x4   TUBAL LIGATION     WRIST SURGERY Right    Had surgery twice to shave bone for blood circulation improvement.    Prior to Admission medications   Medication Sig Start Date End Date Taking? Authorizing Provider  acetaminophen  (TYLENOL ) 500 MG tablet Take 500 mg by mouth every 6 (six) hours as needed.   Yes [provider]  Accu-Chek Softclix Lancets lancets Test  BS twice daily Dx e11.9 03/13/24   Zollie Lowers, MD  albuterol  (VENTOLIN  HFA) 108 (90 Base) MCG/ACT inhaler Inhale 2 puffs into the lungs every 6 (six) hours as needed for wheezing or shortness of breath. 11/19/22   Lavell Lye A, FNP  alendronate  (FOSAMAX ) 70 MG tablet TAKE 1 TABLET BY MOUTH EVERY 7 DAYS. TAKE WITH A FULL GLASS OF WATER ON AN EMPTY STOMACH. 01/10/24   Zollie Lowers, MD  Blood Glucose Monitoring Suppl (ACCU-CHEK GUIDE ME) w/Device KIT Test BS twice daily Dx e11.9 03/13/24   Zollie Lowers, MD  dicyclomine  (BENTYL ) 10 MG capsule Take 1 capsule (10 mg total) by mouth every 8  (eight) hours as needed for spasms. 12/21/23   Mitzie Marlar, Elspeth SQUIBB, MD  fexofenadine -pseudoephedrine (ALLEGRA -D 24) 180-240 MG 24 hr tablet Take 1 tablet by mouth every evening. For allergy and congestion Patient not taking: Reported on 02/29/2024 02/07/24   Zollie Lowers, MD  fluocinonide -emollient (LIDEX -E) 0.05 % cream Apply 1 Application topically 2 (two) times daily. 05/02/23   Zollie Lowers, MD  fluticasone  (FLONASE ) 50 MCG/ACT nasal spray Place 2 sprays into both nostrils daily. 12/12/22   Zollie Lowers, MD  glucose blood (ACCU-CHEK GUIDE TEST) test strip Test BS twice daily Dx e11.9 03/13/24   Zollie Lowers, MD  ipratropium-albuterol  (DUONEB) 0.5-2.5 (3) MG/3ML SOLN Take 3 mLs by nebulization every 6 (six) hours as needed. 11/29/22   Zollie Lowers, MD  Tiotropium Bromide-Olodaterol (STIOLTO RESPIMAT ) 2.5-2.5 MCG/ACT AERS Inhale 2 puffs into the lungs daily. 02/08/24   Shelah Lamar RAMAN, MD    No current facility-administered medications for this encounter.    Allergies as of 03/04/2024 - Review Complete 02/29/2024  Allergen Reaction Noted   Aciphex [rabeprazole sodium] Rash 12/10/2012   Fluconazole Rash and Hives 12/10/2012   Lansoprazole Hives 08/26/2016   Septra [sulfamethoxazole-trimethoprim] Nausea And Vomiting 12/10/2012   Sucralfate  Nausea Only 03/07/2017   Claritin [loratadine] Other (See Comments) 12/10/2012   Crestor  [rosuvastatin ] Other (See Comments) 01/19/2014   Doxycycline Rash 12/10/2012   Ibuprofen Other (See Comments) 12/10/2012   Lipitor [atorvastatin] Other (See Comments) 03/23/2015   Macrobid  [nitrofurantoin ] Diarrhea 09/19/2021   Metoprolol  Nausea And Vomiting 05/08/2014   Metronidazole  Other (See Comments) and Nausea Only 08/24/2016   Nsaids Other (See Comments) 12/10/2012   Omeprazole   02/23/2016   Rabeprazole Rash    Vancomycin  Diarrhea 09/08/2016    Family History  Problem Relation Age of Onset   Cirrhosis Mother        NASH   Irritable bowel syndrome  Mother    Hyperlipidemia Father    Heart failure Father    Bipolar disorder Father    Heart disease Father    Colon polyps Sister        unknown number but fewer than patient   Irritable bowel syndrome Sister    Obesity Daughter    Endometrial cancer Daughter 106   Stomach cancer Maternal Uncle        dx 40s/50s   Lung cancer Maternal Uncle        dx 40s/50s, smoker   Cancer Maternal Uncle        NOS   Cancer Paternal Aunt        NOS   Cancer Paternal Aunt        NOS   Cancer Paternal Uncle        NOS   Heart failure Paternal Grandfather    Leukemia Cousin        dx 91s, died 2  weeks after diagnosis (maternal first cousin)   Cancer Cousin        NOS (paternal first cousin)   Hypertension Brother    Hodgkin's lymphoma Other 14       4th degree relative (mother's half-sibling's son)   Heart attack Neg Hx    Miscarriages / Stillbirths Neg Hx    Colon cancer Neg Hx    Esophageal cancer Neg Hx    Rectal cancer Neg Hx    Breast cancer Neg Hx     Social History   Socioeconomic History   Marital status: Married    Spouse name: Alm   Number of children: 4   Years of education: GED   Highest education level: GED or equivalent  Occupational History   Occupation: Unemployed  Tobacco Use   Smoking status: Former    Current packs/day: 0.00    Average packs/day: 2.0 packs/day for 50.0 years (99.9 ttl pk-yrs)    Types: Cigarettes    Start date: 08/22/1977    Quit date: 04/21/2012    Years since quitting: 11.9   Smokeless tobacco: Never  Vaping Use   Vaping status: Never Used  Substance and Sexual Activity   Alcohol use: No   Drug use: No   Sexual activity: Not Currently    Birth control/protection: Surgical    Comment: tubal and [facility]  Other Topics Concern   Not on file  Social History Narrative   Not on file   Social Drivers of Health   Financial Resource Strain: Medium Risk (02/05/2024)   Overall Financial Resource Strain (CARDIA)    Difficulty of Paying Living  Expenses: Somewhat hard  Food Insecurity: No Food Insecurity (02/05/2024)   Hunger Vital Sign    Worried About Running Out of Food in the Last Year: Never true    Ran Out of Food in the Last Year: Never true  Transportation Needs: No Transportation Needs (02/05/2024)   PRAPARE - Administrator, Civil Service (Medical): No    Lack of Transportation (Non-Medical): No  Physical Activity: Insufficiently Active (02/05/2024)   Exercise Vital Sign    Days of Exercise per Week: 1 day    Minutes of Exercise per Session: 10 min  Stress: Stress Concern Present (02/05/2024)   [employer] of Occupational Health - Occupational Stress Questionnaire    Feeling of Stress: To some extent  Social Connections: Moderately Isolated (02/05/2024)   Social Connection and Isolation Panel    Frequency of Communication with Friends and Family: Three times a week    Frequency of Social Gatherings with Friends and Family: Patient declined    Attends Religious Services: Patient declined    Active Member of Clubs or Organizations: No    Attends Engineer, structural: Not on file    Marital Status: Married  Catering manager Violence: Not At Risk (10/18/2023)   Humiliation, Afraid, Rape, and Kick questionnaire    Fear of Current or Ex-Partner: No    Emotionally Abused: No    Physically Abused: No    Sexually Abused: No    Review of Systems: All other review of systems negative except as mentioned in the HPI.  Physical Exam: Vital signs BP (!) 146/51   Resp 16   Ht 5' 4 (1.626 m)   Wt 90.3 kg   SpO2 95%   BMI 34.16 kg/m   General:   Alert,  Well-developed, pleasant and cooperative in NAD Lungs:  Clear throughout to auscultation.   Heart:  Regular rate and rhythm Abdomen:  Soft, nontender and nondistended.   Neuro/Psych:  Alert and cooperative. Normal mood and affect. A and O x 3  Marcey Naval, MD [facility]

## 2024-03-13 NOTE — Anesthesia Preprocedure Evaluation (Signed)
 Anesthesia Evaluation  Patient identified by MRN, date of birth, ID band Patient awake    Reviewed: Allergy & Precautions, NPO status , Patient's Chart, lab work & pertinent test results  Airway Mallampati: II  TM Distance: >3 FB Neck ROM: Full    Dental no notable dental hx.    Pulmonary asthma , sleep apnea , COPD,  COPD inhaler, former smoker   Pulmonary exam normal        Cardiovascular + DOE  negative cardio ROS  Rhythm:Regular Rate:Normal     Neuro/Psych   Anxiety     negative neurological ROS     GI/Hepatic ,GERD  ,,(+) Cirrhosis   Esophageal Varices      Endo/Other  diabetes    Renal/GU   negative genitourinary   Musculoskeletal  (+) Arthritis , Osteoarthritis,    Abdominal Normal abdominal exam  (+)   Peds  Hematology  (+) Blood dyscrasia, anemia Lab Results      Component                Value               Date                      WBC                      4.8                 02/07/2024                HGB                      14.7                02/07/2024                HCT                      43.3                02/07/2024                MCV                      91                  02/07/2024                PLT                      66 (LL)             02/07/2024              Anesthesia Other Findings   Reproductive/Obstetrics                              Anesthesia Physical Anesthesia Plan  ASA: 3  Anesthesia Plan: MAC   Post-op Pain Management:    Induction: Intravenous  PONV Risk Score and Plan: 2 and Propofol  infusion and Treatment may vary due to age or medical condition  Airway Management Planned: Simple Face Mask and Nasal Cannula  Additional Equipment: None  Intra-op Plan:   Post-operative Plan:   Informed Consent: I have reviewed the patients History and Physical, chart, labs  and discussed the procedure including the risks, benefits and  alternatives for the proposed anesthesia with the patient or authorized representative who has indicated his/her understanding and acceptance.     Dental advisory given  Plan Discussed with: CRNA  Anesthesia Plan Comments:         Anesthesia Quick Evaluation

## 2024-03-13 NOTE — Telephone Encounter (Signed)
 RF on diabetic supplies but changed from OneTouch to Accu-Chek d/t ins

## 2024-03-13 NOTE — Anesthesia Postprocedure Evaluation (Signed)
 Anesthesia Post Note  Patient: Kimberly Obrien  Procedure(s) Performed: EGD (ESOPHAGOGASTRODUODENOSCOPY) ESOPHAGOSCOPY, WITH ESOPHAGEAL VARICES BAND LIGATION     Patient location during evaluation: PACU Anesthesia Type: MAC Level of consciousness: awake and alert Pain management: pain level controlled Vital Signs Assessment: post-procedure vital signs reviewed and stable Respiratory status: spontaneous breathing, nonlabored ventilation, respiratory function stable and patient connected to nasal cannula oxygen  Cardiovascular status: stable and blood pressure returned to baseline Postop Assessment: no apparent nausea or vomiting Anesthetic complications: no   No notable events documented.  Last Vitals:  Vitals:   03/13/24 1210 03/13/24 1220  BP: (!) 125/44 (!) 111/46  Pulse: 60 67  Resp: 13 20  Temp:    SpO2: 95% 94%    Last Pain:  Vitals:   03/13/24 1220  TempSrc:   PainSc: 4                  Kerrick Miler P Aasha Dina

## 2024-03-13 NOTE — Op Note (Signed)
 [facility] Patient Name: Kimberly Obrien Procedure Date: 03/13/2024 MRN: [id number] Attending MD: Elspeth SQUIBB. Leigh , MD, [id number] Date of Birth: 04/16/1961 CSN: [id number] Age: 63 Admit Type: Outpatient Procedure:                Upper GI endoscopy Indications:              therapy of esophageal varices - intolerant of beta                            blockade, varices have enlarged over time. EGD with                            banding as primary prophylactic therapy Providers:                Elspeth P. Leigh, MD, Ozell Pouch, Corene Southgate, Technician Referring MD:              Medicines:                Monitored Anesthesia Care Complications:            No immediate complications. Estimated blood loss:                            Minimal. Estimated Blood Loss:     Estimated blood loss was minimal. Procedure:                Pre-Anesthesia Assessment:                           - Prior to the procedure, a History and Physical                            was performed, and patient medications and                            allergies were reviewed. The patient's tolerance of                            previous anesthesia was also reviewed. The risks                            and benefits of the procedure and the sedation                            options and risks were discussed with the patient.                            All questions were answered, and informed consent                            was obtained. Prior Anticoagulants: The patient has  taken no anticoagulant or antiplatelet agents. ASA                            Grade Assessment: III - A patient with severe                            systemic disease. After reviewing the risks and                            benefits, the patient was deemed in satisfactory                            condition to undergo the procedure.                            After obtaining informed consent, the endoscope was                            passed under direct vision. Throughout the                            procedure, the patient's blood pressure, pulse, and                            oxygen  saturations were monitored continuously. The                            [id number]-H190 ([id number]) Olympus endoscope was introduced                            through the mouth, and advanced to the second part                            of duodenum. The upper GI endoscopy was                            accomplished without difficulty. The patient                            tolerated the procedure well. Scope In: Scope Out: Findings:      Esophagogastric landmarks were identified: the Z-line was found at 41       cm, the gastroesophageal junction was found at 41 cm and the upper       extent of the gastric folds was found at 42 cm from the incisors.      A 1 cm hiatal hernia was present.      Varices were found in the middle third of the esophagus and in the lower       third of the esophagus. They were small to medium in the upper       esophagus, and medium to large in size in the distal esophagus. No red       whale signs or stigmata for bleeding noted. Three bands were       successfully placed in the distal esophagus, resulting in deflation of  varices.      The exam of the esophagus was otherwise normal.      Portal hypertensive gastropathy was found in the entire examined stomach.      The stomach was otherwise without abnormality. No gastric varices.      A single angiodysplastic lesion was found in the second portion of the       duodenum.      The exam of the duodenum was otherwise normal. Impression:               - Esophagogastric landmarks identified.                           - 1 cm hiatal hernia.                           - Esophageal varices - banded x 3.                           - Portal hypertensive gastropathy.                            - A single angiodysplastic lesion in the duodenum.                           - The exam was otherwise normal. Moderate Sedation:      No moderate sedation, case performed with MAC Recommendation:           - Patient has a contact number available for                            emergencies. The signs and symptoms of potential                            delayed complications were discussed with the                            patient. Return to normal activities tomorrow.                            Written discharge instructions were provided to the                            patient.                           - Liquid diet okay now, then soft diet tonight and                            the next few days                           - Continue present medications.                           - Repeat upper endoscopy in 1-2 months for  reassessment and treatment if needed. Procedure Code(s):        --- Professional ---                           [phone], Esophagogastroduodenoscopy, flexible,                            transoral; with band ligation of esophageal/gastric                            varices Diagnosis Code(s):        --- Professional ---                           K44.9, Diaphragmatic hernia without obstruction or                            gangrene                           I85.00, Esophageal varices without bleeding                           K76.6, Portal hypertension                           K31.89, Other diseases of stomach and duodenum                           K31.819, Angiodysplasia of stomach and duodenum                            without bleeding CPT copyright 2022 American Medical Association. All rights reserved. The codes documented in this report are preliminary and upon coder review may  be revised to meet current compliance requirements. Elspeth P. Amantha Sklar, MD 03/13/2024 11:20:20 AM This report has been signed electronically. Number of  Addenda: 0

## 2024-03-13 NOTE — Discharge Instructions (Signed)
 YOU HAD AN ENDOSCOPIC PROCEDURE TODAY: Refer to the procedure report and other information in the discharge instructions given to you for any specific questions about what was found during the examination. If this information does not answer your questions, please call [place] office at [phone] to clarify.   YOU SHOULD EXPECT: Some feelings of bloating in the abdomen. Passage of more gas than usual. Walking can help get rid of the air that was put into your GI tract during the procedure and reduce the bloating.   DIET: Your first meal following the procedure should be a light meal and then it is ok to progress to your normal diet. A half-sandwich or bowl of soup is an example of a good first meal. Heavy or fried foods are harder to digest and may make you feel nauseous or bloated. Drink plenty of fluids but you should avoid alcoholic beverages for 24 hours. Clear liquids today and advance to soft diet this evening as tolerated.  ACTIVITY: Your care partner should take you home directly after the procedure. You should plan to take it easy, moving slowly for the rest of the day. You can resume normal activity the day after the procedure however YOU SHOULD NOT DRIVE, use power tools, machinery or perform tasks that involve climbing or major physical exertion for 24 hours (because of the sedation medicines used during the test).   SYMPTOMS TO REPORT IMMEDIATELY: A gastroenterologist can be reached at any hour. Please call [phone]  for any of the following symptoms:   Following upper endoscopy (EGD, EUS, ERCP, esophageal dilation) Vomiting of blood or coffee ground material  New, significant abdominal pain  New, significant chest pain or pain under the shoulder blades  Painful or persistently difficult swallowing  New shortness of breath  Black, tarry-looking or red, bloody stools  FOLLOW UP:  If any biopsies were taken you will be contacted by phone or by letter within the next 1-3 weeks.  Call [phone]  if you have not heard about the biopsies in 3 weeks.  Please also call with any specific questions about appointments or follow up tests.

## 2024-03-14 ENCOUNTER — Telehealth: Payer: Self-pay

## 2024-03-14 NOTE — Telephone Encounter (Signed)
 Copied from CRM [id number]. Topic: Clinical - Prescription Issue >> Mar 12, 2024  3:45 PM Russell PARAS wrote: Reason for CRM:   Pt contacted clinic concerning Stiolto prescription. Is expensive and she is unable to afford, and is requesting alternate medication be called into [facility] on file.  CB#  [phone], please call on Friday, pt will be in surgery on Thurs.

## 2024-03-16 ENCOUNTER — Encounter (HOSPITAL_COMMUNITY): Payer: Self-pay | Admitting: Gastroenterology

## 2024-03-17 ENCOUNTER — Telehealth: Payer: Self-pay | Admitting: Gastroenterology

## 2024-03-17 ENCOUNTER — Other Ambulatory Visit: Payer: Self-pay

## 2024-03-17 ENCOUNTER — Telehealth: Payer: Self-pay

## 2024-03-17 DIAGNOSIS — K7460 Unspecified cirrhosis of liver: Secondary | ICD-10-CM

## 2024-03-17 DIAGNOSIS — I8500 Esophageal varices without bleeding: Secondary | ICD-10-CM

## 2024-03-17 MED ORDER — FAMOTIDINE 20 MG PO TABS: 20.0000 mg | ORAL_TABLET | Freq: Two times a day (BID) | ORAL | 0 refills | Status: AC

## 2024-03-17 MED ORDER — SUCRALFATE 1 GM/10ML PO SUSP: 1.0000 g | Freq: Three times a day (TID) | ORAL | 0 refills | Status: DC

## 2024-03-17 NOTE — Telephone Encounter (Signed)
 Inbound call from patient states she has something stuck in herr throat.

## 2024-03-17 NOTE — Telephone Encounter (Signed)
 Prescriptions sent to pharmacy as requested

## 2024-03-17 NOTE — Telephone Encounter (Signed)
 Patient had EGD on 7-24. Per Dr. Leigh she will be due for recheck varices in 1 to 2 months.  She had some soreness from procedure on 7-24 so scheduling her for Sept 8th at 9:30am. Case #[id number].  Scheduled patient for telephone previsit on 8-18.  She may elect to have instructions sent via MyChart instead.  Will call patient in a couple of weeks when she will hopefully be feeling better.

## 2024-03-17 NOTE — Telephone Encounter (Signed)
 I called the patient.  She is having some discomfort in her chest where she had it after the procedure although it is much better than it was before and is improving.  She did require some oxycodone  after the procedure for tightness.  She is now having a bit of odynophagia and some soreness when she swallows something it is sore at the site.  I counseled her this can happen after esophageal variceal banding, she is not having any bleeding, I suspect this will resolve within the next week and a half.  We can start her on an antacid and give her some liquid Carafate  to take over the next week I think that will help with the odynophagia.  I see that Carafate  is listed as an allergy but she had just nausea from this I do not think it is a true allergy, we could try her on liquid Carafate  to see if tolerated better.  I will also give her Pepcid  20 mg twice daily.  She has intolerance to multiple PPIs listed in her chart.  Rock can you give her a prescription for liquid Carafate  10 cc every 8 hours as needed, and Pepcid  20 mg twice daily.  You can give her 1 bottle of Carafate  and 2-week course of Pepcid .  Thanks

## 2024-03-17 NOTE — Telephone Encounter (Signed)
 Pt had egd with banding and reports she is still having discomfort. She reports pain in her chest and when she moves around her chest hurts and it feels as she is sob. She is having pain between her breast bone and still having to eat soft foods. Feels like she has to lean back so the food does not hit the spot that hurts. She wants to know what Dr Leigh recommends. Please advise.

## 2024-03-18 ENCOUNTER — Other Ambulatory Visit (HOSPITAL_COMMUNITY): Payer: Self-pay

## 2024-03-18 ENCOUNTER — Telehealth: Payer: Self-pay

## 2024-03-18 NOTE — Telephone Encounter (Signed)
 Called and spoke with the patient. Advised that she has a deductible to meet causing prices to be so high. Pt states she is unable to meet her deductible and will not be able to use the inhaler due to the expensive prices.  Dr. Shelah, do you have any other recommendations.

## 2024-03-18 NOTE — Telephone Encounter (Signed)
 Patient has a deductible to meet before any prices will go down. Once deductible is met the cost will be $47.00

## 2024-03-18 NOTE — Telephone Encounter (Signed)
 Unfortunately not many options here.  We can look at her insurance formulary and try to find the lowest tier rescue inhaler, likely generic albuterol  HFA.  She can use this up to every 4 hours as needed for shortness of breath.  This is really not an equivalent substitution for her maintenance medication, but it should help with symptoms.

## 2024-03-18 NOTE — Telephone Encounter (Signed)
 Per test claims patient has a deductible to meet causing high prices:  Stiolto: $298.48 Anoro Ellipta: $298.48 Bevespi Aerosphere: $298.48  After deductible is met- price shows as $47.00

## 2024-03-19 NOTE — Telephone Encounter (Signed)
 Called and tried to speak with the patient. Phone got disconnected.  Tried calling the patient back, no answer- left vm to call back.

## 2024-03-20 ENCOUNTER — Encounter: Payer: Self-pay | Admitting: Gastroenterology

## 2024-03-24 ENCOUNTER — Telehealth: Payer: Self-pay

## 2024-03-24 NOTE — Telephone Encounter (Signed)
 Copied from CRM #[id number]. Topic: Clinical - Prescription Issue >> Mar 24, 2024  3:43 PM Celestine FALCON wrote: Reason for CRM: Pt is calling back after having trouble speaking with Marcus last [place]. She stated she is already using a rescue inhaler called albuterol  (VENTOLIN  HFA) 108 (90 Base) MCG/ACT inhaler [[id number]]. She wanted to know if that was the same inhaler that Dr. Shelah is suggesting as alternative to stiolto respimat . Pt's phone number is [phone] ok to leave a vm.     Spoke with patient rely message    Verbalized understanding - NFN

## 2024-03-26 ENCOUNTER — Telehealth: Payer: Self-pay

## 2024-03-26 DIAGNOSIS — I8500 Esophageal varices without bleeding: Secondary | ICD-10-CM

## 2024-03-26 DIAGNOSIS — K7460 Unspecified cirrhosis of liver: Secondary | ICD-10-CM

## 2024-03-26 NOTE — Telephone Encounter (Signed)
 Patient returning call. Requesting call back. Please advise.   Thank you

## 2024-03-26 NOTE — Telephone Encounter (Signed)
 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  She will discuss with her son and daughter in law and get back to me.  She wants to see if there are other options like doing it at the [facility] or with another provider

## 2024-03-26 NOTE — Telephone Encounter (Signed)
 Called patient.  She said she will take Monday 9-22 at [facility] but would like to be advised if a Thursday or Friday comes available. Patient does not need a PV as she has had a recent EGD.  Sent instructions to MyChart and mailed to patient.  Amb ref placed.

## 2024-03-26 NOTE — Telephone Encounter (Signed)
-----   Message from [facility] sent at 03/17/2024  4:52 PM EDT ----- Regarding: repeat EGD with banding of varices at [facility] Advise patient that she has been scheduled for repeat EGD for esophageal varices on 04-28-24 at 9:30 am. Patient has a telephone previsit on 8-18.  She may prefer instructions to be just sent to her MyChart.

## 2024-04-07 ENCOUNTER — Encounter

## 2024-04-22 ENCOUNTER — Other Ambulatory Visit: Payer: Self-pay | Admitting: *Deleted

## 2024-04-22 DIAGNOSIS — K7460 Unspecified cirrhosis of liver: Secondary | ICD-10-CM

## 2024-04-22 NOTE — Progress Notes (Signed)
 Patient has been scheduled for RUQ abdominal ultrasound (dx cirrhosis, HCC screen) at [facility]. Appointment is Thursday, 9/11 at 8:30 am, 8:00 am arrival. NPO midnight before procedure.  Left message for patient to call back.

## 2024-04-23 NOTE — Progress Notes (Signed)
 Spoke to patient and advised her of abdominal ultrasound 05/01/24. Unfortunately, this date will not work well for her. She has been given the number to radiology scheduling and says she will call them to schedule to a date that works better for her.

## 2024-05-01 ENCOUNTER — Ambulatory Visit (HOSPITAL_COMMUNITY)

## 2024-05-05 ENCOUNTER — Other Ambulatory Visit: Payer: Self-pay

## 2024-05-05 ENCOUNTER — Encounter (HOSPITAL_COMMUNITY): Payer: Self-pay | Admitting: Gastroenterology

## 2024-05-05 NOTE — Progress Notes (Signed)
 Anesthesia Review:  PCP: Butler Der LVO 02/07/2024  Cardiologist : Verlin Hamilton Weaver LVO 01/04/24  Pulm0- DR byrum- lov 02/08/2024   PPM/ ICD: Device Orders: Rep Notified:  Chest x-ray : EKG : 11/02/2023  Monitor- 12/10/23  Echo : 2023  Stress test: Cardiac Cath :   Activity level:  does get slightly sob per pt  Sleep Study/ CPAP : Fasting Blood Sugar :      / Checks Blood Sugar -- times a day:     PT is no longer a diabetic. Not since 08/2023 per pt   Blood Thinner/ Instructions Cherre Dose: ASA / Instructions/ Last Dose :    03/13/24- EGD

## 2024-05-07 ENCOUNTER — Telehealth: Payer: Self-pay

## 2024-05-07 NOTE — Telephone Encounter (Signed)
 Procedure:EGD Procedure date: 05/12/24 Procedure location: [facility] Arrival Time: 7:00 Spoke with the patient Y/N: Y Any prep concerns? N  Has the patient obtained the prep from the pharmacy ? N Do you have a care partner and transportation: Y Any additional concerns? N

## 2024-05-12 ENCOUNTER — Ambulatory Visit (HOSPITAL_COMMUNITY): Admitting: Certified Registered Nurse Anesthetist

## 2024-05-12 ENCOUNTER — Other Ambulatory Visit: Payer: Self-pay

## 2024-05-12 ENCOUNTER — Encounter (HOSPITAL_COMMUNITY): Payer: Self-pay | Admitting: Gastroenterology

## 2024-05-12 ENCOUNTER — Encounter (HOSPITAL_COMMUNITY)
Admission: RE | Disposition: A | Discharge status: Home / Self Care | Payer: Self-pay | Source: Home / Self Care | Attending: Gastroenterology

## 2024-05-12 ENCOUNTER — Ambulatory Visit (HOSPITAL_BASED_OUTPATIENT_CLINIC_OR_DEPARTMENT_OTHER): Admitting: Certified Registered Nurse Anesthetist

## 2024-05-12 ENCOUNTER — Ambulatory Visit (HOSPITAL_COMMUNITY)
Admission: RE | Admit: 2024-05-12 | Discharge: 2024-05-12 | Disposition: A | Discharge status: Home / Self Care | Attending: Gastroenterology | Admitting: Gastroenterology

## 2024-05-12 DIAGNOSIS — I8500 Esophageal varices without bleeding: Secondary | ICD-10-CM

## 2024-05-12 DIAGNOSIS — Z87891 Personal history of nicotine dependence: Secondary | ICD-10-CM | POA: Insufficient documentation

## 2024-05-12 DIAGNOSIS — G4730 Sleep apnea, unspecified: Secondary | ICD-10-CM

## 2024-05-12 DIAGNOSIS — K3189 Other diseases of stomach and duodenum: Secondary | ICD-10-CM

## 2024-05-12 DIAGNOSIS — Z5986 Financial insecurity: Secondary | ICD-10-CM | POA: Diagnosis not present

## 2024-05-12 DIAGNOSIS — Z9889 Other specified postprocedural states: Secondary | ICD-10-CM | POA: Diagnosis not present

## 2024-05-12 DIAGNOSIS — E119 Type 2 diabetes mellitus without complications: Secondary | ICD-10-CM | POA: Diagnosis not present

## 2024-05-12 DIAGNOSIS — J4489 Other specified chronic obstructive pulmonary disease: Secondary | ICD-10-CM | POA: Insufficient documentation

## 2024-05-12 DIAGNOSIS — F419 Anxiety disorder, unspecified: Secondary | ICD-10-CM | POA: Insufficient documentation

## 2024-05-12 DIAGNOSIS — K219 Gastro-esophageal reflux disease without esophagitis: Secondary | ICD-10-CM | POA: Diagnosis not present

## 2024-05-12 DIAGNOSIS — K766 Portal hypertension: Secondary | ICD-10-CM

## 2024-05-12 DIAGNOSIS — J449 Chronic obstructive pulmonary disease, unspecified: Secondary | ICD-10-CM | POA: Diagnosis not present

## 2024-05-12 DIAGNOSIS — Z560 Unemployment, unspecified: Secondary | ICD-10-CM | POA: Insufficient documentation

## 2024-05-12 DIAGNOSIS — I8510 Secondary esophageal varices without bleeding: Secondary | ICD-10-CM | POA: Insufficient documentation

## 2024-05-12 DIAGNOSIS — K449 Diaphragmatic hernia without obstruction or gangrene: Secondary | ICD-10-CM

## 2024-05-12 DIAGNOSIS — K7460 Unspecified cirrhosis of liver: Secondary | ICD-10-CM

## 2024-05-12 HISTORY — PX: GASTRIC VARICES BANDING: SHX5519

## 2024-05-12 HISTORY — DX: Personal history of urinary calculi: Z87.442

## 2024-05-12 HISTORY — PX: ESOPHAGOGASTRODUODENOSCOPY: SHX5428

## 2024-05-12 HISTORY — DX: Other complications of anesthesia, initial encounter: T88.59XA

## 2024-05-12 LAB — GLUCOSE, CAPILLARY: Glucose-Capillary: 122 mg/dL — ABNORMAL HIGH (ref 70–99)

## 2024-05-12 SURGERY — EGD (ESOPHAGOGASTRODUODENOSCOPY)
Anesthesia: Monitor Anesthesia Care

## 2024-05-12 MED ORDER — FENTANYL CITRATE (PF) 100 MCG/2ML IJ SOLN
INTRAMUSCULAR | Status: AC
  Filled 2024-05-12: qty 2

## 2024-05-12 MED ORDER — PROPOFOL 1000 MG/100ML IV EMUL
INTRAVENOUS | Status: AC
  Filled 2024-05-12: qty 100

## 2024-05-12 MED ORDER — PROPOFOL 500 MG/50ML IV EMUL
INTRAVENOUS | Status: AC
  Filled 2024-05-12: qty 100

## 2024-05-12 MED ORDER — PROPOFOL 500 MG/50ML IV EMUL
INTRAVENOUS | Status: DC | PRN
  Administered 2024-05-12: 30 mg via INTRAVENOUS
  Administered 2024-05-12: 20 mg via INTRAVENOUS
  Administered 2024-05-12: 200 ug/kg/min via INTRAVENOUS
  Administered 2024-05-12: 20 mg via INTRAVENOUS
  Administered 2024-05-12: 30 mg via INTRAVENOUS

## 2024-05-12 MED ORDER — PROPOFOL 500 MG/50ML IV EMUL
INTRAVENOUS | Status: AC
  Filled 2024-05-12: qty 50

## 2024-05-12 MED ORDER — LACTATED RINGERS IV SOLN
INTRAVENOUS | Status: AC | PRN
  Administered 2024-05-12: 1000 mL via INTRAVENOUS

## 2024-05-12 MED ORDER — FENTANYL CITRATE (PF) 100 MCG/2ML IJ SOLN
25.0000 ug | Freq: Once | INTRAMUSCULAR | Status: AC
  Administered 2024-05-12: 25 ug via INTRAVENOUS

## 2024-05-12 MED ORDER — SUCRALFATE 1 G PO TABS: 1.0000 g | ORAL_TABLET | Freq: Three times a day (TID) | ORAL | 0 refills | Status: AC | PRN

## 2024-05-12 MED ORDER — LIDOCAINE 2% (20 MG/ML) 5 ML SYRINGE
INTRAMUSCULAR | Status: DC | PRN
  Administered 2024-05-12: 80 mg via INTRAVENOUS

## 2024-05-12 MED ORDER — PROPOFOL 10 MG/ML IV BOLUS
INTRAVENOUS | Status: AC
  Filled 2024-05-12: qty 20

## 2024-05-12 MED ORDER — SODIUM CHLORIDE 0.9 % IV SOLN: INTRAVENOUS | Status: DC

## 2024-05-12 NOTE — Discharge Instructions (Addendum)
 YOU HAD AN ENDOSCOPIC PROCEDURE TODAY: Refer to the procedure report and other information in the discharge instructions given to you for any specific questions about what was found during the examination. If this information does not answer your questions, please call [place] office at [phone] to clarify.   YOU SHOULD EXPECT: Some feelings of bloating in the abdomen. Passage of more gas than usual. Walking can help get rid of the air that was put into your GI tract during the procedure and reduce the bloating.  DIET: Take a liquid diet today. Starting tomorrow, you may start a soft diet for the next few days then advance to normal. Your first meal following the procedure should be a light meal and then it is ok to progress to your normal diet. A half-sandwich or bowl of soup is an example of a good first meal. Heavy or fried foods are harder to digest and may make you feel nauseous or bloated. Drink plenty of fluids but you should avoid alcoholic beverages for 24 hours.    ACTIVITY: Your care partner should take you home directly after the procedure. You should plan to take it easy, moving slowly for the rest of the day. You can resume normal activity the day after the procedure however YOU SHOULD NOT DRIVE, use power tools, machinery or perform tasks that involve climbing or major physical exertion for 24 hours (because of the sedation medicines used during the test).   SYMPTOMS TO REPORT IMMEDIATELY: A gastroenterologist can be reached at any hour. Please call [phone]  for any of the following symptoms:  Following upper endoscopy (EGD, EUS, ERCP, esophageal dilation) Vomiting of blood or coffee ground material  New, significant abdominal pain  New, significant chest pain or pain under the shoulder blades  Painful or persistently difficult swallowing  New shortness of breath  Black, tarry-looking or red, bloody stools  FOLLOW UP:  If any biopsies were taken you will be contacted by  phone or by letter within the next 1-3 weeks. Call [phone]  if you have not heard about the biopsies in 3 weeks.  Please also call with any specific questions about appointments or follow up tests.

## 2024-05-12 NOTE — Transfer of Care (Signed)
 Immediate Anesthesia Transfer of Care Note  Patient: Kimberly Obrien  Procedure(s) Performed: EGD (ESOPHAGOGASTRODUODENOSCOPY) BAND LIGATION, GASTRIC VARICES  Patient Location: PACU  Anesthesia Type:MAC  Level of Consciousness: drowsy and responds to stimulation  Airway & Oxygen  Therapy: Patient Spontanous Breathing and Patient connected to face mask oxygen   Post-op Assessment: Report given to RN and Post -op Vital signs reviewed and stable  Post vital signs: Reviewed and stable  Last Vitals:  Vitals Value Taken Time  BP 161/57 (93)   Temp    Pulse 77 05/12/24 08:02  Resp 11 05/12/24 08:02  SpO2 100 % 05/12/24 08:02  Vitals shown include unfiled device data.  Last Pain:  Vitals:   05/12/24 0703  TempSrc: Temporal  PainSc: 0-No pain         Complications: No notable events documented.

## 2024-05-12 NOTE — Anesthesia Postprocedure Evaluation (Signed)
 Anesthesia Post Note  Patient: Kimberly Obrien  Procedure(s) Performed: EGD (ESOPHAGOGASTRODUODENOSCOPY) BAND LIGATION, GASTRIC VARICES     Patient location during evaluation: PACU Anesthesia Type: MAC Level of consciousness: awake and alert Pain management: pain level controlled Vital Signs Assessment: post-procedure vital signs reviewed and stable Respiratory status: spontaneous breathing, nonlabored ventilation, respiratory function stable and patient connected to nasal cannula oxygen  Cardiovascular status: stable and blood pressure returned to baseline Postop Assessment: no apparent nausea or vomiting Anesthetic complications: no   No notable events documented.  Last Vitals:  Vitals:   05/12/24 0900 05/12/24 0910  BP: (!) 113/50 (!) 112/46  Pulse: (!) 59 (!) 59  Resp: 11 12  Temp:    SpO2: 92% 91%    Last Pain:  Vitals:   05/12/24 0910  TempSrc:   PainSc: 6                  Epifanio Lamar BRAVO

## 2024-05-12 NOTE — Op Note (Signed)
 [facility] Patient Name: Kimberly Obrien Procedure Date: 05/12/2024 MRN: [id number] Attending MD: Elspeth SQUIBB. Leigh , MD, [id number] Date of Birth: 1961/01/11 CSN: [id number] Age: 63 Admit Type: Outpatient Procedure:                Upper GI endoscopy Indications:              treatment of esophageal varices - intolerant to                            beta blockade with enlarging varices over time.                            Banding x 3 done in July, here for reassessment and                            additional banding as needede Providers:                Elspeth SQUIBB. Leigh, MD, Curtistine Bishop,                            Technician, Ozell Pouch Referring MD:              Medicines:                Monitored Anesthesia Care Complications:            No immediate complications. Estimated blood loss:                            Minimal. Estimated Blood Loss:     Estimated blood loss was minimal. Procedure:                Pre-Anesthesia Assessment:                           - Prior to the procedure, a History and Physical                            was performed, and patient medications and                            allergies were reviewed. The patient's tolerance of                            previous anesthesia was also reviewed. The risks                            and benefits of the procedure and the sedation                            options and risks were discussed with the patient.                            All questions were answered, and informed consent  was obtained. Prior Anticoagulants: The patient has                            taken no anticoagulant or antiplatelet agents. ASA                            Grade Assessment: III - A patient with severe                            systemic disease. After reviewing the risks and                            benefits, the patient was deemed in satisfactory                             condition to undergo the procedure.                           After obtaining informed consent, the endoscope was                            passed under direct vision. Throughout the                            procedure, the patient's blood pressure, pulse, and                            oxygen  saturations were monitored continuously. The                            [id number]-H190 ([id number] Olympus endoscope was introduced                            through the mouth, and advanced to the second part                            of duodenum. The upper GI endoscopy was                            accomplished without difficulty. The patient                            tolerated the procedure well. Scope In: Scope Out: Findings:      Esophagogastric landmarks were identified: the Z-line was found at 39       cm, the gastroesophageal junction was found at 39 cm and the upper       extent of the gastric folds was found at 40 cm from the incisors.      A 1 cm hiatal hernia was present.      Varices were found in the middle third of the esophagus and in the lower       third of the esophagus. There was stigmata of previous banding noted.       Overall, improvement compared to prior exam. There was a moderate column  of varix in between two previously banded areas (around 36-37cm from the       incisors) and a large varix roughly 27-28cm from the incisors, that       persisted. No red spots or stigmata of bleeding noted. Two bands were       successfully placed (one in each area) resulting in deflation of varices.      The exam of the esophagus was otherwise normal.      Portal hypertensive gastropathy was found in the entire examined stomach.      The exam of the stomach was otherwise normal.      The examined duodenum was normal. Impression:               - Esophagogastric landmarks identified.                           - 1 cm hiatal hernia.                           - Improvement in varices  with scarring from prior                            banding noted - 2 areas with residual varix tissue                            that were treated today as outlined above - 2 bands                            placed in total                           - Portal hypertensive gastropathy.                           - Normal examined duodenum. Moderate Sedation:      No moderate sedation, case performed with MAC Recommendation:           - Patient has a contact number available for                            emergencies. The signs and symptoms of potential                            delayed complications were discussed with the                            patient. Return to normal activities tomorrow.                            Written discharge instructions were provided to the                            patient.                           - Full liquid diet today, advance to soft tomorrow  for the next few days.                           - Continue present medications.                           - Take pepcid  twice daily (intolerant to multiple                            PPIs in the past)                           - Liquid carfate PRN for discomfort                           - Repeat upper endoscopy within 2 months for                            surveillance for varices and retreatment if needed Procedure Code(s):        --- Professional ---                           [phone], Esophagogastroduodenoscopy, flexible,                            transoral; with band ligation of esophageal/gastric                            varices Diagnosis Code(s):        --- Professional ---                           K44.9, Diaphragmatic hernia without obstruction or                            gangrene                           I85.00, Esophageal varices without bleeding                           K76.6, Portal hypertension                           K31.89, Other diseases of stomach and  duodenum CPT copyright 2022 [employer]. All rights reserved. The codes documented in this report are preliminary and upon coder review may  be revised to meet current compliance requirements. Elspeth P. Leigh, MD 05/12/2024 8:09:05 AM This report has been signed electronically. Number of Addenda: 0

## 2024-05-12 NOTE — Anesthesia Preprocedure Evaluation (Signed)
 Anesthesia Evaluation  Patient identified by MRN, date of birth, ID band Patient awake    Reviewed: Allergy & Precautions, NPO status , Patient's Chart, lab work & pertinent test results  Airway Mallampati: II  TM Distance: >3 FB Neck ROM: Full    Dental no notable dental hx.    Pulmonary asthma , sleep apnea , COPD,  COPD inhaler, former smoker   Pulmonary exam normal        Cardiovascular + DOE  negative cardio ROS  Rhythm:Regular Rate:Normal     Neuro/Psych   Anxiety     negative neurological ROS     GI/Hepatic ,GERD  ,,(+) Cirrhosis   Esophageal Varices      Endo/Other  diabetes    Renal/GU   negative genitourinary   Musculoskeletal  (+) Arthritis , Osteoarthritis,    Abdominal Normal abdominal exam  (+)   Peds  Hematology  (+) Blood dyscrasia, anemia Lab Results      Component                Value               Date                      WBC                      4.8                 02/07/2024                HGB                      14.7                02/07/2024                HCT                      43.3                02/07/2024                MCV                      91                  02/07/2024                PLT                      66 (LL)             02/07/2024              Anesthesia Other Findings   Reproductive/Obstetrics                              Anesthesia Physical Anesthesia Plan  ASA: 3  Anesthesia Plan: MAC   Post-op Pain Management:    Induction: Intravenous  PONV Risk Score and Plan: 2 and Propofol  infusion and Treatment may vary due to age or medical condition  Airway Management Planned: Simple Face Mask and Nasal Cannula  Additional Equipment: None  Intra-op Plan:   Post-operative Plan:   Informed Consent: I have reviewed the patients History and Physical, chart, labs  and discussed the procedure including the risks, benefits and  alternatives for the proposed anesthesia with the patient or authorized representative who has indicated his/her understanding and acceptance.     Dental advisory given  Plan Discussed with: CRNA  Anesthesia Plan Comments:         Anesthesia Quick Evaluation

## 2024-05-12 NOTE — Anesthesia Procedure Notes (Signed)
 Procedure Name: MAC Date/Time: 05/12/2024 7:34 AM  Performed by: Franchot Delon RAMAN, CRNAPre-anesthesia Checklist: Patient identified, Emergency Drugs available, Suction available and Patient being monitored Oxygen  Delivery Method: Simple face mask Airway Equipment and Method: Bite block Placement Confirmation: positive ETCO2 Dental Injury: Teeth and Oropharynx as per pre-operative assessment

## 2024-05-12 NOTE — Progress Notes (Signed)
 Pt presented for EGD with esophageal varices banding today with MD Armbruster. Post-procedurally, pt c/o substernal/subxiphoid post-banding chest pain. Initially rated pain 5/10 then worsening to 8/10. MD Armbruster notified and assessed pt at bedside. VO for 25 mcg fentanyl  IV received. Medication given as ordered (see e-MAR).   Addendum 9165: Pt endorses some interval improvement in pain post-fentanyl . Rates pain 7/10. MD Armbruster notified. No new orders at this time.  Addendum 9148: Pt continues to rate pain 7/10. Pt states pain is not at a manageable level at this time. MD Armbruster notified. VO for 25 mcg fentanyl  IV. Medication given as ordered (see e-MAR). Pt's spouse, Alm, updated by phone.  Addendum 9097: Pt endorses mild interval improvement in pain after additional 25 mcg of fentanyl . Pt currently rates pain 6/10.   Addendum 9073: Pt continues to rate pain 6/10. Pt notably in less distress at this time. Pt discharged to home with instructions to return if pain worsens or does not improve.  Ozell VEAR Pouch, RN 05/12/24 9:27 AM

## 2024-05-12 NOTE — OR Nursing (Signed)
 Pt recovered by MW, ENDO RN; I went over pts AVS with her and she has no questions. Kimberly Obrien

## 2024-05-12 NOTE — H&P (Signed)
 [facility] History and Physical   Primary Care Physician:  Zollie Lowers, MD   Reason for Procedure:   Treatment / reassessment of esophageal varices  Plan:    EGD with possible banding of varices.     HPI: Kimberly Obrien is a 63 y.o. female  with cirrhosis and esophageal varices - she has been intolerant to beta blockade with enlargement in varices over time. Had an EGD in July with 3 bands placed for primary therapy. Here for EGD to assess response to therapy and treat with additional bands if needed. We discussed risks / benefits of this, she understands. Otherwise feels at baseline. She had some chest tightness / discomfort from the prior banding which resolved after several days.  Otherwise feels well without any cardiopulmonary symptoms today.   I have discussed risks / benefits of anesthesia and endoscopic procedure with Kimberly Obrien and they wish to proceed with the exams as outlined today.    Past Medical History:  Diagnosis Date   Anemia    Anticoagulation monitoring by pharmacist    Arthritis    Asthma    Blood transfusion without reported diagnosis    Cataract    removed both eyes   Cirrhosis (HCC)    Complication of anesthesia    hypotension   COPD (chronic obstructive pulmonary disease) (HCC)    Diabetes mellitus without complication (HCC)    no diabets since 08/2023   Dyspnea    exertion   Elevated hemoglobin A1c 03/01/2018   Family history of Hodgkin's lymphoma    Family history of leukemia    Family history of lung cancer    Family history of stomach cancer    Family history of uterine cancer    GERD (gastroesophageal reflux disease)    mild- diet related   Heart murmur    History of colon polyps    History of kidney stones    History of pulmonary embolism 02/04/2013   Hyperlipidemia    controlled   NASH (nonalcoholic steatohepatitis)    Neuromuscular disorder (HCC)    back surgery   PE (pulmonary embolism)    Routine  Papanicolaou smear 09/12/2021   Sleep apnea    no cpap   Ureteral calculus, left 07/28/2021   UTI symptoms 08/18/2021    Past Surgical History:  Procedure Laterality Date   ABDOMINAL HYSTERECTOMY  2009   BREAST BIOPSY Left    CATARACT EXTRACTION, BILATERAL Bilateral    CHOLECYSTECTOMY     COLONOSCOPY WITH PROPOFOL  N/A 05/07/2017   Procedure: COLONOSCOPY WITH PROPOFOL ;  Surgeon: Celestia Agent, MD;  Location: [facility];  Service: Endoscopy;  Laterality: N/A;   ESOPHAGEAL BANDING N/A 03/13/2024   Procedure: ESOPHAGOSCOPY, WITH ESOPHAGEAL VARICES BAND LIGATION;  Surgeon: Leigh Elspeth SQUIBB, MD;  Location: [facility];  Service: Gastroenterology;  Laterality: N/A;   ESOPHAGOGASTRODUODENOSCOPY N/A 03/13/2024   Procedure: EGD (ESOPHAGOGASTRODUODENOSCOPY);  Surgeon: Leigh Elspeth SQUIBB, MD;  Location: [facility];  Service: Gastroenterology;  Laterality: N/A;   ESOPHAGOGASTRODUODENOSCOPY (EGD) WITH PROPOFOL  N/A 05/07/2017   Procedure: ESOPHAGOGASTRODUODENOSCOPY (EGD) WITH PROPOFOL ;  Surgeon: Celestia Agent, MD;  Location: [facility];  Service: Endoscopy;  Laterality: N/A;   INCONTINENCE SURGERY  2009   with hysterectomy   IR TRANSCATHETER BX  03/15/2018   IR VENOGRAM HEPATIC W HEMODYNAMIC EVALUATION  03/15/2018   LEFT AND RIGHT HEART CATHETERIZATION WITH CORONARY ANGIOGRAM N/A 05/13/2014   Procedure: LEFT AND RIGHT HEART CATHETERIZATION WITH CORONARY ANGIOGRAM;  Surgeon: Lonni JONETTA Cash, MD;  Location: [facility]  CATH LAB;  Service: Cardiovascular;  Laterality: N/A;   SPINE SURGERY     L5 S1 fusion x4   TUBAL LIGATION     WRIST SURGERY Right    Had surgery twice to shave bone for blood circulation improvement.    Prior to Admission medications   Medication Sig Start Date End Date Taking? Authorizing Provider  acetaminophen  (TYLENOL ) 500 MG tablet Take 500 mg by mouth every 6 (six) hours as needed.   Yes [provider]  albuterol  (VENTOLIN  HFA) 108 (90 Base) MCG/ACT  inhaler Inhale 2 puffs into the lungs every 6 (six) hours as needed for wheezing or shortness of breath. 11/19/22  Yes Hawks, Christy A, FNP  alendronate  (FOSAMAX ) 70 MG tablet TAKE 1 TABLET BY MOUTH EVERY 7 DAYS. TAKE WITH A FULL GLASS OF WATER ON AN EMPTY STOMACH. 01/10/24  Yes Zollie Lowers, MD  Accu-Chek Softclix Lancets lancets Test BS twice daily Dx e11.9 03/13/24   Zollie Lowers, MD  Blood Glucose Monitoring Suppl (ACCU-CHEK GUIDE ME) w/Device KIT Test BS twice daily Dx e11.9 03/13/24   Zollie Lowers, MD  dicyclomine  (BENTYL ) 10 MG capsule Take 1 capsule (10 mg total) by mouth every 8 (eight) hours as needed for spasms. 12/21/23   Nylani Michetti, Elspeth SQUIBB, MD  famotidine  (PEPCID ) 20 MG tablet Take 1 tablet (20 mg total) by mouth 2 (two) times daily. 03/17/24   Sharnae Winfree, Elspeth SQUIBB, MD  fexofenadine -pseudoephedrine (ALLEGRA -D 24) 180-240 MG 24 hr tablet Take 1 tablet by mouth every evening. For allergy and congestion Patient not taking: Reported on 02/29/2024 02/07/24   Zollie Lowers, MD  fluocinonide -emollient (LIDEX -E) 0.05 % cream Apply 1 Application topically 2 (two) times daily. 05/02/23   Zollie Lowers, MD  fluticasone  (FLONASE ) 50 MCG/ACT nasal spray Place 2 sprays into both nostrils daily. 12/12/22   Zollie Lowers, MD  glucose blood (ACCU-CHEK GUIDE TEST) test strip Test BS twice daily Dx e11.9 03/13/24   Zollie Lowers, MD  ipratropium-albuterol  (DUONEB) 0.5-2.5 (3) MG/3ML SOLN Take 3 mLs by nebulization every 6 (six) hours as needed. 11/29/22   Zollie Lowers, MD  oxyCODONE  (OXY IR/ROXICODONE ) 5 MG immediate release tablet Take 1 tablet (5 mg total) by mouth every 4 (four) hours as needed for severe pain (pain score 7-10). 03/13/24   Noha Milberger, Elspeth SQUIBB, MD  sucralfate  (CARAFATE ) 1 GM/10ML suspension Take 10 mLs (1 g total) by mouth every 8 (eight) hours. 03/17/24   Olufemi Mofield, Elspeth SQUIBB, MD  Tiotropium Bromide-Olodaterol (STIOLTO RESPIMAT ) 2.5-2.5 MCG/ACT AERS Inhale 2 puffs into the lungs daily.  02/08/24   Shelah Lamar RAMAN, MD    Current Facility-Administered Medications  Medication Dose Route Frequency Provider Last Rate Last Admin   0.9 %  sodium chloride  infusion   Intravenous Continuous Keone Kamer, Elspeth SQUIBB, MD       lactated ringers  infusion    Continuous PRN Leigh Elspeth SQUIBB, MD 10 mL/hr at 05/12/24 0713 1,000 mL at 05/12/24 0713    Allergies as of 03/17/2024 - Review Complete 03/13/2024  Allergen Reaction Noted   Aciphex [rabeprazole sodium] Rash 12/10/2012   Fluconazole Rash and Hives 12/10/2012   Lansoprazole Hives 08/26/2016   Septra [sulfamethoxazole-trimethoprim] Nausea And Vomiting 12/10/2012   Sucralfate  Nausea Only 03/07/2017   Claritin [loratadine] Other (See Comments) 12/10/2012   Crestor  [rosuvastatin ] Other (See Comments) 01/19/2014   Doxycycline Rash 12/10/2012   Ibuprofen Other (See Comments) 12/10/2012   Lipitor [atorvastatin] Other (See Comments) 03/23/2015   Macrobid  [nitrofurantoin ] Diarrhea 09/19/2021   Metoprolol  Nausea And Vomiting  05/08/2014   Metronidazole  Other (See Comments) and Nausea Only 08/24/2016   Nsaids Other (See Comments) 12/10/2012   Omeprazole   02/23/2016   Rabeprazole Rash    Vancomycin  Diarrhea 09/08/2016    Family History  Problem Relation Age of Onset   Cirrhosis Mother        NASH   Irritable bowel syndrome Mother    Hyperlipidemia Father    Heart failure Father    Bipolar disorder Father    Heart disease Father    Colon polyps Sister        unknown number but fewer than patient   Irritable bowel syndrome Sister    Obesity Daughter    Endometrial cancer Daughter 27   Stomach cancer Maternal Uncle        dx 40s/50s   Lung cancer Maternal Uncle        dx 40s/50s, smoker   Cancer Maternal Uncle        NOS   Cancer Paternal Aunt        NOS   Cancer Paternal Aunt        NOS   Cancer Paternal Uncle        NOS   Heart failure Paternal Grandfather    Leukemia Cousin        dx 61s, died 2 weeks after diagnosis  (maternal first cousin)   Cancer Cousin        NOS (paternal first cousin)   Hypertension Brother    Hodgkin's lymphoma Other 14       4th degree relative (mother's half-sibling's son)   Heart attack Neg Hx    Miscarriages / Stillbirths Neg Hx    Colon cancer Neg Hx    Esophageal cancer Neg Hx    Rectal cancer Neg Hx    Breast cancer Neg Hx     Social History   Socioeconomic History   Marital status: Married    Spouse name: Alm   Number of children: 4   Years of education: GED   Highest education level: GED or equivalent  Occupational History   Occupation: Unemployed  Tobacco Use   Smoking status: Former    Current packs/day: 0.00    Average packs/day: 2.0 packs/day for 50.0 years (99.9 ttl pk-yrs)    Types: Cigarettes    Start date: 08/22/1977    Quit date: 04/21/2012    Years since quitting: 12.0   Smokeless tobacco: Never  Vaping Use   Vaping status: Never Used  Substance and Sexual Activity   Alcohol use: No   Drug use: No   Sexual activity: Not Currently    Birth control/protection: Surgical    Comment: tubal and [facility]  Other Topics Concern   Not on file  Social History Narrative   Not on file   Social Drivers of Health   Financial Resource Strain: Medium Risk (02/05/2024)   Overall Financial Resource Strain (CARDIA)    Difficulty of Paying Living Expenses: Somewhat hard  Food Insecurity: No Food Insecurity (02/05/2024)   Hunger Vital Sign    Worried About Running Out of Food in the Last Year: Never true    Ran Out of Food in the Last Year: Never true  Transportation Needs: No Transportation Needs (02/05/2024)   PRAPARE - Administrator, Civil Service (Medical): No    Lack of Transportation (Non-Medical): No  Physical Activity: Insufficiently Active (02/05/2024)   Exercise Vital Sign    Days of Exercise per Week: 1 day  Minutes of Exercise per Session: 10 Obrien  Stress: Stress Concern Present (02/05/2024)   [employer] of Occupational  Health - Occupational Stress Questionnaire    Feeling of Stress: To some extent  Social Connections: Moderately Isolated (02/05/2024)   Social Connection and Isolation Panel    Frequency of Communication with Friends and Family: Three times a week    Frequency of Social Gatherings with Friends and Family: Patient declined    Attends Religious Services: Patient declined    Active Member of Clubs or Organizations: No    Attends Engineer, structural: Not on file    Marital Status: Married  Catering manager Violence: Not At Risk (10/18/2023)   Humiliation, Afraid, Rape, and Kick questionnaire    Fear of Current or Ex-Partner: No    Emotionally Abused: No    Physically Abused: No    Sexually Abused: No    Review of Systems: All other review of systems negative except as mentioned in the HPI.  Physical Exam: Vital signs BP 139/65   Pulse 76   Temp 97.8 F (36.6 C) (Temporal)   Resp 12   Ht 5' 4 (1.626 m)   Wt 90.3 kg   SpO2 94%   BMI 34.17 kg/m   General:   Alert,  Well-developed, pleasant and cooperative in NAD Lungs:  Clear throughout to auscultation.   Heart:  Regular rate and rhythm Abdomen:  Soft, nontender and nondistended.   Neuro/Psych:  Alert and cooperative. Normal mood and affect. A and O x 3  Marcey Naval, MD [facility]

## 2024-05-14 ENCOUNTER — Encounter (HOSPITAL_COMMUNITY): Payer: Self-pay | Admitting: Gastroenterology

## 2024-05-15 ENCOUNTER — Telehealth: Payer: Self-pay

## 2024-05-15 NOTE — Telephone Encounter (Signed)
 Attempted to call patient to schedule pft at 10:00 prior to appointment

## 2024-05-16 ENCOUNTER — Encounter: Payer: Self-pay | Admitting: Emergency Medicine

## 2024-05-16 ENCOUNTER — Ambulatory Visit: Admitting: Emergency Medicine

## 2024-05-16 VITALS — BP 161/78 | HR 67 | Temp 97.8°F | Resp 18 | Ht 64.0 in | Wt 201.4 lb

## 2024-05-16 DIAGNOSIS — G4733 Obstructive sleep apnea (adult) (pediatric): Secondary | ICD-10-CM | POA: Diagnosis not present

## 2024-05-16 DIAGNOSIS — J432 Centrilobular emphysema: Secondary | ICD-10-CM | POA: Diagnosis not present

## 2024-05-16 DIAGNOSIS — Z86711 Personal history of pulmonary embolism: Secondary | ICD-10-CM

## 2024-05-16 MED ORDER — STIOLTO RESPIMAT 2.5-2.5 MCG/ACT IN AERS: 2.0000 | INHALATION_SPRAY | Freq: Every day | RESPIRATORY_TRACT | Status: AC

## 2024-05-16 NOTE — Assessment & Plan Note (Signed)
 Significant COPD but she was unable to get on Stiolto due to cost and her medication insurance deductible.  She is using albuterol  as needed.  We will try to supplement her with samples for now, get the Stiolto started.  Hopefully she would be able to obtain the medication if her medication deductible is no longer in effect after January 1.  She has not had her pulmonary function testing and we will work on getting this scheduled.    We need to schedule your pulmonary function testing We will try again to start Stiolto 2 puffs once daily.  Keep track of how this medication impacts her breathing and also for any side effects/palpitations.  We will give you samples of this so that you could do a real trial without the cost burden Keep your albuterol  available to use 2 puffs when needed for shortness of breath, chest tightness, wheezing. Continue to work on your exercise, stamina and conditioning. Follow in our office in January 2026.  At that time we will review your medications and see if Stiolto (or an alternative) is more affordable without an insurance medication deductible.

## 2024-05-16 NOTE — Patient Instructions (Addendum)
 We will continue to hold off on any anticoagulation medication at this time given the potential bleeding risk We need to schedule your pulmonary function testing We will try again to start Stiolto 2 puffs once daily.  Keep track of how this medication impacts her breathing and also for any side effects/palpitations.  We will give you samples of this so that you could do a real trial without the cost burden Keep your albuterol  available to use 2 puffs when needed for shortness of breath, chest tightness, wheezing. Continue to work on your exercise, stamina and conditioning. Follow in our office in January 2026.  At that time we will review your medications and see if Stiolto (or an alternative) is more affordable without an insurance medication deductible.

## 2024-05-16 NOTE — Assessment & Plan Note (Signed)
 Optimally she would be on chronic maintenance anticoagulation.  This has been stopped due to GI bleeding.  Risks and benefits discussed with her today.  For now makes the most sense to stay off anticoagulation.

## 2024-05-16 NOTE — Progress Notes (Signed)
 Subjective:    Patient ID: Kimberly Obrien, female    DOB: 1960-11-16, 63 y.o.   MRN: [id number]  HPI  ROV 05/16/2024 --follow-up visit 63 year old woman with remote pulm emboli (not on anticoagulation due to GIB) COPD, OSA, diabetes, GERD, hyperlipidemia, NASH with esophageal varices.  I saw her in June for multifactorial dyspnea with components of restriction from obesity, COPD, OSA previously on BiPAP but not now, deconditioning.  We performed pulmonary function testing (not done) and I started her on Stiolto to see if she would tolerate, benefit. She reports today that she couldn't start Stiolto because she has a medication deductible. She is using albuterol  about 1-2 times daily. She gets SOB w activity, walking. Has to stop to rest w hills or longer distances. No cough. She did not desat on walking oximetry last visit. Does not use BiPAP and does not want to restart     Review of Systems As per HPI      Objective:   Physical Exam Vitals:   05/16/24 1134  BP: (!) 161/78  Pulse: 67  Resp: 18  Temp: 97.8 F (36.6 C)  TempSrc: Oral  SpO2: 93%  Weight: 201 lb 6.4 oz (91.4 kg)  Height: 5' 4 (1.626 m)    Gen: Pleasant, overweight woman, in no distress,  normal affect  ENT: No lesions,  mouth clear,  oropharynx clear, no postnasal drip  Neck: No JVD, no stridor  Lungs: No use of accessory muscles, decreased at both bases but no wheezing or crackles  Cardiovascular: RRR, heart sounds normal, no murmur or gallops, no peripheral edema  Musculoskeletal: No deformities, no cyanosis or clubbing  Neuro: alert, awake, non focal  Skin: Warm, no lesions or rash      Assessment & Plan:   COPD (chronic obstructive pulmonary disease) (HCC) Significant COPD but she was unable to get on Stiolto due to cost and her medication insurance deductible.  She is using albuterol  as needed.  We will try to supplement her with samples for now, get the Stiolto started.  Hopefully she  would be able to obtain the medication if her medication deductible is no longer in effect after January 1.  She has not had her pulmonary function testing and we will work on getting this scheduled.    We need to schedule your pulmonary function testing We will try again to start Stiolto 2 puffs once daily.  Keep track of how this medication impacts her breathing and also for any side effects/palpitations.  We will give you samples of this so that you could do a real trial without the cost burden Keep your albuterol  available to use 2 puffs when needed for shortness of breath, chest tightness, wheezing. Continue to work on your exercise, stamina and conditioning. Follow in our office in January 2026.  At that time we will review your medications and see if Stiolto (or an alternative) is more affordable without an insurance medication deductible.  OSA (obstructive sleep apnea) Unable to tolerate BiPAP, no longer using.  Discussed this with her and she is not interested in restarting.  History of pulmonary embolism Optimally she would be on chronic maintenance anticoagulation.  This has been stopped due to GI bleeding.  Risks and benefits discussed with her today.  For now makes the most sense to stay off anticoagulation.  Time spent 51 minutes reviewing chart, studies, hospitalization/ER visit, medications, discussing therapeutic options including risks and benefits of anticoagulation, planning treatment   Lamar Chris, MD,  PhD 05/16/2024, 8:31 PM [facility] [phone] or if no answer before 7:00PM call [phone] For any issues after 7:00PM please call eLink [phone]

## 2024-05-16 NOTE — Assessment & Plan Note (Signed)
 Unable to tolerate BiPAP, no longer using.  Discussed this with her and she is not interested in restarting.

## 2024-05-22 ENCOUNTER — Other Ambulatory Visit: Payer: Self-pay

## 2024-05-22 DIAGNOSIS — I8500 Esophageal varices without bleeding: Secondary | ICD-10-CM

## 2024-05-23 ENCOUNTER — Encounter (HOSPITAL_COMMUNITY): Admission: RE | Payer: Self-pay | Source: Home / Self Care

## 2024-05-23 ENCOUNTER — Ambulatory Visit (HOSPITAL_COMMUNITY): Admission: RE | Admit: 2024-05-23 | Source: Home / Self Care | Admitting: Gastroenterology

## 2024-05-23 SURGERY — ESOPHAGOSCOPY, WITH ESOPHAGEAL VARICES BAND LIGATION
Anesthesia: Monitor Anesthesia Care

## 2024-05-30 ENCOUNTER — Ambulatory Visit (HOSPITAL_COMMUNITY)
Admission: RE | Admit: 2024-05-30 | Discharge: 2024-05-30 | Disposition: A | Discharge status: Home / Self Care | Source: Ambulatory Visit | Attending: Gastroenterology | Admitting: Gastroenterology

## 2024-05-30 DIAGNOSIS — Z9049 Acquired absence of other specified parts of digestive tract: Secondary | ICD-10-CM | POA: Diagnosis not present

## 2024-05-30 DIAGNOSIS — K7460 Unspecified cirrhosis of liver: Secondary | ICD-10-CM | POA: Insufficient documentation

## 2024-06-03 ENCOUNTER — Ambulatory Visit: Payer: Self-pay | Admitting: Gastroenterology

## 2024-06-06 ENCOUNTER — Telehealth: Payer: Self-pay | Admitting: Gastroenterology

## 2024-06-06 NOTE — Telephone Encounter (Signed)
 Pt called wanting to schedule her hosp procedure. Pt was supposed to have next procedure in 2 mth. Discussed with her we were waiting on the Jan schedule to come out as Dr Leigh did not have a date for December. She was confused because she thought she was going to have it done 12/18 but that was an error on the schedule. She states no one told her the 18th was not an option. Let her know message would be sent to Dr Leigh to see if perhaps he knew of a slot that I am not aware of, pt kept stating she really would like it on a Friday. Let her know we don't usually have hospital dates on a Friday. Offered to look for another MD slot to get her procedure done but she does not want to have a different doctor. Pt stated there had to be some give and take from each side when scheduling her procedure. Hard for her to do M-Thur as her Children would have to take off work to bring her and they have already had to do that 2 times. Please advise.

## 2024-06-06 NOTE — Telephone Encounter (Signed)
 Inbound call from patient stating she would like to schedule her esophagoscopy at the hospital since her last two were. Patient requesting a call back. Please advise  Thank you

## 2024-06-08 NOTE — Telephone Encounter (Signed)
 Sorry to hear this. I have no open dates for the rest of 2025 for procedures. I have one date in November which is already overbooked and no room.  I do have a hospital week where I am covering [facility] the week of December 8th where I will be at [facility]. If they will allow me to do a outpatient the AM of Friday 12/7 at 730 AM I could do it then. Can you check with the hospital to see if that is an option? Please let her know if she does this, it will be at [facility] hospital, not [facility], and I am trying my best to accommodate her as there are currently no open outpatient slots otherwise. Thanks

## 2024-06-09 NOTE — Telephone Encounter (Signed)
 Dr Leigh Dec 7th is a Sunday. Do you mean 12/12?

## 2024-06-10 ENCOUNTER — Other Ambulatory Visit: Payer: Self-pay

## 2024-06-10 DIAGNOSIS — I8500 Esophageal varices without bleeding: Secondary | ICD-10-CM

## 2024-06-10 DIAGNOSIS — K7460 Unspecified cirrhosis of liver: Secondary | ICD-10-CM

## 2024-06-10 NOTE — Telephone Encounter (Signed)
 Thanks Rock, sorry that was a typo. Yes, could do 730 AM on 12/12 if room in endo at [facility] and if the patient is willing. Thanks

## 2024-06-10 NOTE — Telephone Encounter (Signed)
 Orders in epic, spoke with [facility] endo and pt is scheduled for 08/01/24 at 7:30am, she would need to arrive at 6am and be npo after midnight. Procedure is scheduled at [facility]. Left message for pt to call back.

## 2024-06-11 ENCOUNTER — Other Ambulatory Visit: Payer: Self-pay

## 2024-06-11 DIAGNOSIS — I8500 Esophageal varices without bleeding: Secondary | ICD-10-CM

## 2024-06-11 NOTE — Telephone Encounter (Signed)
 Spoke with pt and she is aware of appt. She knows when to arrive and to be npo after midnight. Amb ref entered in epic.

## 2024-06-11 NOTE — Progress Notes (Signed)
 Kimberly Obrien                                          MRN: [id number]   06/11/2024   The [facility] Quality Team Specialist reviewed this patient medical record for the purposes of chart review for care gap closure. The following were reviewed: chart review for care gap closure-kidney health evaluation for diabetes:eGFR  and uACR.    [facility] Quality Team

## 2024-06-11 NOTE — Progress Notes (Signed)
 Kimberly Obrien                                          MRN: [id number]   06/11/2024   The [facility] Quality Team Specialist reviewed this patient medical record for the purposes of chart review for care gap closure. The following were reviewed: chart review for care gap closure-diabetic eye exam.    [facility]BCI Quality Team

## 2024-06-11 NOTE — Progress Notes (Signed)
 Kimberly Obrien                                          MRN: [id number]   06/11/2024   The [facility] Quality Team Specialist reviewed this patient medical record for the purposes of chart review for care gap closure. The following were reviewed: abstraction for care gap closure-glycemic status assessment.    [facility] Quality Team

## 2024-07-29 ENCOUNTER — Telehealth: Payer: Self-pay

## 2024-07-29 DIAGNOSIS — K7460 Unspecified cirrhosis of liver: Secondary | ICD-10-CM

## 2024-07-29 NOTE — Telephone Encounter (Signed)
 Orders entered.  MyChart message to patient to go to the lab

## 2024-07-29 NOTE — Telephone Encounter (Signed)
-----   Message from [facility] sent at 03/17/2024  4:50 PM EDT ----- Regarding: FW: due for labs Per Dr. Leigh (03-17-24) she will be due in December/January for  CBC, CMET, INR, AFP ----- Message ----- From: Edmundo Clarita HERO, CMA Sent: 03/17/2024  12:00 AM EDT To: Clarita HERO Edmundo, CMA Subject: due for labs in August                         Due for cbc, cmet, Inr and Afp in August

## 2024-07-30 ENCOUNTER — Telehealth: Payer: Self-pay | Admitting: Gastroenterology

## 2024-07-30 NOTE — Telephone Encounter (Signed)
 Spoke w pt about upcoming procedure at hospital. Pt states throat is sore and draining. Pt states she needs help with prep instructions and what to eat. Please advise thank you.

## 2024-07-30 NOTE — Telephone Encounter (Signed)
 Spoke with pt, she has a little sore throat and wanted to make sure she could still have the banding done. Discussed with her if she has not had a fever and no productive cough she should be fine to proceed with the procedure as scheduled.

## 2024-08-01 ENCOUNTER — Ambulatory Visit (HOSPITAL_COMMUNITY)
Admission: RE | Admit: 2024-08-01 | Discharge: 2024-08-01 | Disposition: A | Attending: Gastroenterology | Admitting: Gastroenterology

## 2024-08-01 ENCOUNTER — Encounter (HOSPITAL_COMMUNITY): Payer: Self-pay | Admitting: Gastroenterology

## 2024-08-01 ENCOUNTER — Ambulatory Visit (HOSPITAL_COMMUNITY): Admitting: Anesthesiology

## 2024-08-01 ENCOUNTER — Telehealth: Payer: Self-pay

## 2024-08-01 ENCOUNTER — Other Ambulatory Visit: Payer: Self-pay

## 2024-08-01 ENCOUNTER — Encounter (HOSPITAL_COMMUNITY)
Admission: RE | Disposition: A | Discharge status: Home / Self Care | Payer: Self-pay | Source: Home / Self Care | Attending: Gastroenterology

## 2024-08-01 DIAGNOSIS — I8500 Esophageal varices without bleeding: Secondary | ICD-10-CM | POA: Diagnosis not present

## 2024-08-01 DIAGNOSIS — K3189 Other diseases of stomach and duodenum: Secondary | ICD-10-CM | POA: Diagnosis not present

## 2024-08-01 DIAGNOSIS — K219 Gastro-esophageal reflux disease without esophagitis: Secondary | ICD-10-CM | POA: Diagnosis not present

## 2024-08-01 DIAGNOSIS — K449 Diaphragmatic hernia without obstruction or gangrene: Secondary | ICD-10-CM

## 2024-08-01 DIAGNOSIS — Z87891 Personal history of nicotine dependence: Secondary | ICD-10-CM | POA: Insufficient documentation

## 2024-08-01 DIAGNOSIS — K7460 Unspecified cirrhosis of liver: Secondary | ICD-10-CM | POA: Insufficient documentation

## 2024-08-01 DIAGNOSIS — I8510 Secondary esophageal varices without bleeding: Secondary | ICD-10-CM | POA: Diagnosis not present

## 2024-08-01 DIAGNOSIS — Z59868 Other specified financial insecurity: Secondary | ICD-10-CM | POA: Diagnosis not present

## 2024-08-01 DIAGNOSIS — J4489 Other specified chronic obstructive pulmonary disease: Secondary | ICD-10-CM | POA: Insufficient documentation

## 2024-08-01 DIAGNOSIS — J449 Chronic obstructive pulmonary disease, unspecified: Secondary | ICD-10-CM | POA: Diagnosis not present

## 2024-08-01 DIAGNOSIS — E119 Type 2 diabetes mellitus without complications: Secondary | ICD-10-CM | POA: Diagnosis not present

## 2024-08-01 DIAGNOSIS — Z8249 Family history of ischemic heart disease and other diseases of the circulatory system: Secondary | ICD-10-CM | POA: Insufficient documentation

## 2024-08-01 DIAGNOSIS — K766 Portal hypertension: Secondary | ICD-10-CM | POA: Insufficient documentation

## 2024-08-01 DIAGNOSIS — M1990 Unspecified osteoarthritis, unspecified site: Secondary | ICD-10-CM | POA: Diagnosis not present

## 2024-08-01 DIAGNOSIS — G709 Myoneural disorder, unspecified: Secondary | ICD-10-CM | POA: Diagnosis not present

## 2024-08-01 DIAGNOSIS — Z560 Unemployment, unspecified: Secondary | ICD-10-CM | POA: Insufficient documentation

## 2024-08-01 HISTORY — PX: ESOPHAGOGASTRODUODENOSCOPY: SHX5428

## 2024-08-01 SURGERY — EGD (ESOPHAGOGASTRODUODENOSCOPY)
Anesthesia: Monitor Anesthesia Care

## 2024-08-01 MED ORDER — PROPOFOL 500 MG/50ML IV EMUL
INTRAVENOUS | Status: AC
  Filled 2024-08-01: qty 200

## 2024-08-01 MED ORDER — PROPOFOL 10 MG/ML IV BOLUS
INTRAVENOUS | Status: DC | PRN
  Administered 2024-08-01: 200 ug/kg/min via INTRAVENOUS
  Administered 2024-08-01: 30 mg via INTRAVENOUS

## 2024-08-01 MED ORDER — LIDOCAINE 2% (20 MG/ML) 5 ML SYRINGE
INTRAMUSCULAR | Status: DC | PRN
  Administered 2024-08-01: 60 mg via INTRAVENOUS

## 2024-08-01 MED ORDER — SODIUM CHLORIDE 0.9 % IV SOLN: INTRAVENOUS | Status: DC

## 2024-08-01 NOTE — Anesthesia Preprocedure Evaluation (Signed)
 Anesthesia Evaluation  Patient identified by MRN, date of birth, ID band Patient awake    Reviewed: Allergy & Precautions, NPO status , Patient's Chart, lab work & pertinent test results, reviewed documented beta blocker date and time   History of Anesthesia Complications (+) history of anesthetic complications  Airway Mallampati: II       Dental  (+) Upper Dentures, Poor Dentition,    Pulmonary shortness of breath and with exertion, asthma , sleep apnea , COPD,  COPD inhaler, former smoker   breath sounds clear to auscultation       Cardiovascular (-) angina + DOE  (-) CAD, (-) Past MI and (-) Cardiac Stents (-) dysrhythmias + Valvular Problems/Murmurs  Rhythm:Regular Rate:Normal  IMPRESSIONS     1. Left ventricular ejection fraction, by estimation, is 65 to 70%. Left  ventricular ejection fraction by 3D volume is 66 %. The left ventricle has  normal function. The left ventricle has no regional wall motion  abnormalities. Left ventricular diastolic   parameters were normal. The average left ventricular global longitudinal  strain is -25.9 %. The global longitudinal strain is normal.   2. Right ventricular systolic function is normal. The right ventricular  size is normal. Tricuspid regurgitation signal is inadequate for assessing  PA pressure.   3. The mitral valve is grossly normal. Trivial mitral valve  regurgitation. No evidence of mitral stenosis.   4. The aortic valve is tricuspid. Aortic valve regurgitation is not  visualized. No aortic stenosis is present.   5. The inferior vena cava is normal in size with greater than 50%  respiratory variability, suggesting right atrial pressure of 3 mmHg.     Neuro/Psych neg Seizures PSYCHIATRIC DISORDERS Anxiety      Neuromuscular disease    GI/Hepatic ,GERD  Medicated,,(+) Cirrhosis   Esophageal Varices    , Hepatitis -  Endo/Other  diabetes    Renal/GU       Musculoskeletal  (+) Arthritis ,    Abdominal   Peds  Hematology  (+) Blood dyscrasia, anemia   Anesthesia Other Findings   Reproductive/Obstetrics                              Anesthesia Physical Anesthesia Plan  ASA: 3  Anesthesia Plan: MAC   Post-op Pain Management:    Induction: Intravenous  PONV Risk Score and Plan: 2 and Ondansetron  and Propofol  infusion  Airway Management Planned: Natural Airway and Simple Face Mask  Additional Equipment:   Intra-op Plan:   Post-operative Plan:   Informed Consent: I have reviewed the patients History and Physical, chart, labs and discussed the procedure including the risks, benefits and alternatives for the proposed anesthesia with the patient or authorized representative who has indicated his/her understanding and acceptance.     Dental advisory given  Plan Discussed with: CRNA  Anesthesia Plan Comments:          Anesthesia Quick Evaluation

## 2024-08-01 NOTE — Discharge Instructions (Signed)

## 2024-08-01 NOTE — Transfer of Care (Signed)
 Immediate Anesthesia Transfer of Care Note  Patient: Kimberly Obrien  Procedure(s) Performed: EGD (ESOPHAGOGASTRODUODENOSCOPY)  Patient Location: PACU  Anesthesia Type:MAC  Level of Consciousness: sedated  Airway & Oxygen  Therapy: Patient Spontanous Breathing  Post-op Assessment: Report given to RN  Post vital signs: Reviewed and stable  Last Vitals:  Vitals Value Taken Time  BP 108/56 08/01/24 07:42  Temp 36.6 C 08/01/24 07:41  Pulse 69 08/01/24 07:43  Resp 11 08/01/24 07:43  SpO2 95 % 08/01/24 07:43  Vitals shown include unfiled device data.  Last Pain:  Vitals:   08/01/24 0741  TempSrc: Temporal  PainSc:       Patients Stated Pain Goal: 0 (08/01/24 9351)  Complications: No notable events documented.

## 2024-08-01 NOTE — Telephone Encounter (Signed)
-----   Message from Elspeth Naval, MD sent at 08/01/2024  8:01 AM EST ----- Regarding: RE: due for labs Thanks Jan. Yes I told her she is due for that and routine office visit. Can you help coordinate routine office visit? We can do labs when she comes in then. Thanks ----- Message ----- From: Edmundo Clarita HERO, CMA Sent: 07/31/2024   5:40 PM EST To: Elspeth SHAUNNA Naval, MD Subject: due for labs                                   I have a reminder that patient is due for labs. I sent her a message but she hasn't  read it. Can you remind her she is due for labs when you see her tomorrow, assuming you still want her to have those at this time.  Thanks, Jan

## 2024-08-01 NOTE — Op Note (Signed)
 [facility] Patient Name: Kimberly Obrien Procedure Date : 08/01/2024 MRN: [id number] Attending MD: Elspeth SQUIBB. Leigh , MD, [id number] Date of Birth: 04/24/1961 CSN: [id number] Age: 63 Admit Type: Outpatient Procedure:                Upper GI endoscopy Indications:              For therapy of esophageal varices - intolerant to                            beta blockade - had enlarging varices over time.                            Treated with band ligation in July and Sept 2025 -                            5 bands placed total - here for surveillance Providers:                Elspeth P. Leigh, MD, Ozell Pouch,                            Haskel Chris, Technician Referring MD:              Medicines:                Monitored Anesthesia Care Complications:            No immediate complications. Estimated blood loss:                            None. Estimated Blood Loss:     Estimated blood loss: none. Procedure:                Pre-Anesthesia Assessment:                           - Prior to the procedure, a History and Physical                            was performed, and patient medications and                            allergies were reviewed. The patient's tolerance of                            previous anesthesia was also reviewed. The risks                            and benefits of the procedure and the sedation                            options and risks were discussed with the patient.                            All questions were answered, and informed consent  was obtained. Prior Anticoagulants: The patient has                            taken no anticoagulant or antiplatelet agents. ASA                            Grade Assessment: III - A patient with severe                            systemic disease. After reviewing the risks and                            benefits, the patient was deemed in satisfactory                             condition to undergo the procedure.                           After obtaining informed consent, the endoscope was                            passed under direct vision. Throughout the                            procedure, the patient's blood pressure, pulse, and                            oxygen  saturations were monitored continuously. The                            [id number]-[id number] ([id number]) Olympus endoscope was introduced                            through the mouth, and advanced to the second part                            of duodenum. The upper GI endoscopy was                            accomplished without difficulty. The patient                            tolerated the procedure well. Scope In: Scope Out: Findings:      The Z-line was regular.      A 1 cm hiatal hernia was present.      Grade I / small varices were found in the lower third of the esophagus.       Stigmata of prior banding and scarring noted in the distal esophagus. No       high risk lesions. No varices amenable to banding today - significant       improvement over the last 2 exams.      The exam of the esophagus was otherwise normal.      Portal hypertensive gastropathy was found in the entire examined stomach.  The exam of the stomach was otherwise normal.      The examined duodenum was normal. Impression:               - Z-line regular.                           - 1 cm hiatal hernia.                           - Small varices with scarring / stigmata of prior                            banding - no further banding performed today.                           - Portal hypertensive gastropathy.                           - Normal examined duodenum. Recommendation:           - Patient has a contact number available for                            emergencies. The signs and symptoms of potential                            delayed complications were discussed with the                            patient.  Return to normal activities tomorrow.                            Written discharge instructions were provided to the                            patient.                           - Advance diet as tolerated.                           - Continue present medications.                           - Repeat upper endoscopy in 6 to 9 months for                            surveillance and additional banding as needed.                           - Follow up in the office for routine cirrhosis                            care, our office can coordinate Procedure Code(s):        --- Professional ---  56764, Esophagogastroduodenoscopy, flexible,                            transoral; diagnostic, including collection of                            specimen(s) by brushing or washing, when performed                            (separate procedure) Diagnosis Code(s):        --- Professional ---                           K44.9, Diaphragmatic hernia without obstruction or                            gangrene                           I85.00, Esophageal varices without bleeding                           K76.6, Portal hypertension                           K31.89, Other diseases of stomach and duodenum CPT copyright 2022 American Medical Association. All rights reserved. The codes documented in this report are preliminary and upon coder review may  be revised to meet current compliance requirements. Elspeth P. Wesson Stith, MD 08/01/2024 7:50:12 AM This report has been signed electronically. Number of Addenda: 0

## 2024-08-01 NOTE — Telephone Encounter (Signed)
 Informed patient that Dr. Leigh wanted me to schedule her for a follow up visit and she can have her labs done at that time. Patient scheduled for 10/03/24 at 10:30am.

## 2024-08-01 NOTE — H&P (Signed)
 [facility] History and Physical   Primary Care Physician:  Zollie Lowers, MD   Reason for Procedure:   Esophageal varices - possible banding  Plan:    EGD with possible banding of varices     HPI: Kimberly Obrien is a 63 y.o. female  with cirrhosis here for EGD - she has esophageal varices that have enlarged over time. She does not tolerate beta blockade. She has had an EGD in July and Sept with total of 5 bands placed to eradicate varices. HEre for surveillance and further banding as appropriate. Otherwise feels well without any cardiopulmonary symptoms.   I have discussed risks / benefits of anesthesia and endoscopic procedure with Kimberly Obrien and they wish to proceed with the exams as outlined today.   The patient was provided an opportunity to ask questions and all were answered. The patient agreed with the plan.    Past Medical History:  Diagnosis Date   Anemia    Anticoagulation monitoring by pharmacist    Arthritis    Asthma    Blood transfusion without reported diagnosis    Cataract    removed both eyes   Cirrhosis (HCC)    Complication of anesthesia    hypotension   COPD (chronic obstructive pulmonary disease) (HCC)    Diabetes mellitus without complication (HCC)    no diabets since 08/2023   Dyspnea    exertion   Elevated hemoglobin A1c 03/01/2018   Family history of Hodgkin's lymphoma    Family history of leukemia    Family history of lung cancer    Family history of stomach cancer    Family history of uterine cancer    GERD (gastroesophageal reflux disease)    mild- diet related   Heart murmur    History of colon polyps    History of kidney stones    History of pulmonary embolism 02/04/2013   Hyperlipidemia    controlled   NASH (nonalcoholic steatohepatitis)    Neuromuscular disorder (HCC)    back surgery   PE (pulmonary embolism)    Routine Papanicolaou smear 09/12/2021   Sleep apnea    no cpap   Ureteral calculus, left  07/28/2021   UTI symptoms 08/18/2021    Past Surgical History:  Procedure Laterality Date   ABDOMINAL HYSTERECTOMY  2009   BREAST BIOPSY Left    CATARACT EXTRACTION, BILATERAL Bilateral    CHOLECYSTECTOMY     COLONOSCOPY WITH PROPOFOL  N/A 05/07/2017   Procedure: COLONOSCOPY WITH PROPOFOL ;  Surgeon: Celestia Agent, MD;  Location: [facility];  Service: Endoscopy;  Laterality: N/A;   ESOPHAGEAL BANDING N/A 03/13/2024   Procedure: ESOPHAGOSCOPY, WITH ESOPHAGEAL VARICES BAND LIGATION;  Surgeon: Leigh Elspeth SQUIBB, MD;  Location: [facility];  Service: Gastroenterology;  Laterality: N/A;   ESOPHAGOGASTRODUODENOSCOPY N/A 03/13/2024   Procedure: EGD (ESOPHAGOGASTRODUODENOSCOPY);  Surgeon: Leigh Elspeth SQUIBB, MD;  Location: [facility];  Service: Gastroenterology;  Laterality: N/A;   ESOPHAGOGASTRODUODENOSCOPY N/A 05/12/2024   Procedure: EGD (ESOPHAGOGASTRODUODENOSCOPY);  Surgeon: Leigh Elspeth SQUIBB, MD;  Location: [facility];  Service: Gastroenterology;  Laterality: N/A;   ESOPHAGOGASTRODUODENOSCOPY (EGD) WITH PROPOFOL  N/A 05/07/2017   Procedure: ESOPHAGOGASTRODUODENOSCOPY (EGD) WITH PROPOFOL ;  Surgeon: Celestia Agent, MD;  Location: [facility];  Service: Endoscopy;  Laterality: N/A;   GASTRIC VARICES BANDING N/A 05/12/2024   Procedure: BAND LIGATION, GASTRIC VARICES;  Surgeon: Leigh Elspeth SQUIBB, MD;  Location: [facility];  Service: Gastroenterology;  Laterality: N/A;   INCONTINENCE SURGERY  2009   with hysterectomy   IR  TRANSCATHETER BX  03/15/2018   IR VENOGRAM HEPATIC W HEMODYNAMIC EVALUATION  03/15/2018   LEFT AND RIGHT HEART CATHETERIZATION WITH CORONARY ANGIOGRAM N/A 05/13/2014   Procedure: LEFT AND RIGHT HEART CATHETERIZATION WITH CORONARY ANGIOGRAM;  Surgeon: Lonni JONETTA Cash, MD;  Location: [facility] CATH LAB;  Service: Cardiovascular;  Laterality: N/A;   SPINE SURGERY     L5 S1 fusion x4   TUBAL LIGATION     WRIST SURGERY Right    Had surgery twice to shave bone for  blood circulation improvement.    Prior to Admission medications  Medication Sig Start Date End Date Taking? Authorizing Provider  fluticasone  (FLONASE ) 50 MCG/ACT nasal spray Place 2 sprays into both nostrils daily. 12/12/22  Yes Zollie Lowers, MD  Accu-Chek Softclix Lancets lancets Test BS twice daily Dx e11.9 03/13/24   Zollie Lowers, MD  acetaminophen  (TYLENOL ) 500 MG tablet Take 500 mg by mouth every 6 (six) hours as needed.    [provider]  albuterol  (VENTOLIN  HFA) 108 (90 Base) MCG/ACT inhaler Inhale 2 puffs into the lungs every 6 (six) hours as needed for wheezing or shortness of breath. 11/19/22   Lavell Lye A, FNP  alendronate  (FOSAMAX ) 70 MG tablet TAKE 1 TABLET BY MOUTH EVERY 7 DAYS. TAKE WITH A FULL GLASS OF WATER ON AN EMPTY STOMACH. 01/10/24   Zollie Lowers, MD  Blood Glucose Monitoring Suppl (ACCU-CHEK GUIDE ME) w/Device KIT Test BS twice daily Dx e11.9 03/13/24   Zollie Lowers, MD  dicyclomine  (BENTYL ) 10 MG capsule Take 1 capsule (10 mg total) by mouth every 8 (eight) hours as needed for spasms. 12/21/23   Charlen Bakula, Elspeth SQUIBB, MD  famotidine  (PEPCID ) 20 MG tablet Take 1 tablet (20 mg total) by mouth 2 (two) times daily. 03/17/24   Napoleon Monacelli, Elspeth SQUIBB, MD  fexofenadine -pseudoephedrine (ALLEGRA -D 24) 180-240 MG 24 hr tablet Take 1 tablet by mouth every evening. For allergy and congestion Patient not taking: Reported on 02/29/2024 02/07/24   Zollie Lowers, MD  fluocinonide -emollient (LIDEX -E) 0.05 % cream Apply 1 Application topically 2 (two) times daily. 05/02/23   Zollie Lowers, MD  glucose blood (ACCU-CHEK GUIDE TEST) test strip Test BS twice daily Dx e11.9 03/13/24   Zollie Lowers, MD  ipratropium-albuterol  (DUONEB) 0.5-2.5 (3) MG/3ML SOLN Take 3 mLs by nebulization every 6 (six) hours as needed. 11/29/22   Zollie Lowers, MD  oxyCODONE  (OXY IR/ROXICODONE ) 5 MG immediate release tablet Take 1 tablet (5 mg total) by mouth every 4 (four) hours as needed for severe pain  (pain score 7-10). 03/13/24   Leeyah Heather, Elspeth SQUIBB, MD  sucralfate  (CARAFATE ) 1 g tablet Take 1 tablet (1 g total) by mouth every 8 (eight) hours as needed. Dissolve 1 tablet in a small amount of water and drink, every 8 hours PRN for chest discomfort 05/12/24 05/12/25  Diontre Harps, Elspeth SQUIBB, MD  Tiotropium Bromide-Olodaterol (STIOLTO RESPIMAT ) 2.5-2.5 MCG/ACT AERS Inhale 2 puffs into the lungs daily. 02/08/24   Byrum, Robert S, MD  Tiotropium Bromide-Olodaterol (STIOLTO RESPIMAT ) 2.5-2.5 MCG/ACT AERS Inhale 2 puffs into the lungs daily. 05/16/24   Shelah Lamar RAMAN, MD    Current Facility-Administered Medications  Medication Dose Route Frequency Provider Last Rate Last Admin   0.9 %  sodium chloride  infusion   Intravenous Continuous Wilbert Hayashi, Elspeth SQUIBB, MD 20 mL/hr at 08/01/24 0651 New Bag at 08/01/24 0651    Allergies as of 06/10/2024 - Review Complete 05/16/2024  Allergen Reaction Noted   Aciphex [rabeprazole sodium] Rash 12/10/2012   Fluconazole Rash and  Hives 12/10/2012   Lansoprazole Hives 08/26/2016   Septra [sulfamethoxazole-trimethoprim] Nausea And Vomiting 12/10/2012   Sucralfate  Nausea Only 03/07/2017   Claritin [loratadine] Other (See Comments) 12/10/2012   Crestor  [rosuvastatin ] Other (See Comments) 01/19/2014   Doxycycline Rash 12/10/2012   Ibuprofen Other (See Comments) 12/10/2012   Lipitor [atorvastatin] Other (See Comments) 03/23/2015   Macrobid  [nitrofurantoin ] Diarrhea 09/19/2021   Metoprolol  Nausea And Vomiting 05/08/2014   Metronidazole  Other (See Comments) and Nausea Only 08/24/2016   Nsaids Other (See Comments) 12/10/2012   Omeprazole   02/23/2016   Rabeprazole Rash    Vancomycin  Diarrhea 09/08/2016    Family History  Problem Relation Age of Onset   Cirrhosis Mother        NASH   Irritable bowel syndrome Mother    Hyperlipidemia Father    Heart failure Father    Bipolar disorder Father    Heart disease Father    Colon polyps Sister        unknown number but  fewer than patient   Irritable bowel syndrome Sister    Obesity Daughter    Endometrial cancer Daughter 27   Stomach cancer Maternal Uncle        dx 40s/50s   Lung cancer Maternal Uncle        dx 40s/50s, smoker   Cancer Maternal Uncle        NOS   Cancer Paternal Aunt        NOS   Cancer Paternal Aunt        NOS   Cancer Paternal Uncle        NOS   Heart failure Paternal Grandfather    Leukemia Cousin        dx 72s, died 2 weeks after diagnosis (maternal first cousin)   Cancer Cousin        NOS (paternal first cousin)   Hypertension Brother    Hodgkin's lymphoma Other 14       4th degree relative (mother's half-sibling's son)   Heart attack Neg Hx    Miscarriages / Stillbirths Neg Hx    Colon cancer Neg Hx    Esophageal cancer Neg Hx    Rectal cancer Neg Hx    Breast cancer Neg Hx     Social History   Socioeconomic History   Marital status: Married    Spouse name: Alm   Number of children: 4   Years of education: GED   Highest education level: GED or equivalent  Occupational History   Occupation: Unemployed  Tobacco Use   Smoking status: Former    Current packs/day: 0.00    Average packs/day: 2.0 packs/day for 50.0 years (99.9 ttl pk-yrs)    Types: Cigarettes    Start date: 08/22/1977    Quit date: 04/21/2012    Years since quitting: 12.2   Smokeless tobacco: Never  Vaping Use   Vaping status: Never Used  Substance and Sexual Activity   Alcohol use: No   Drug use: No   Sexual activity: Not Currently    Birth control/protection: Surgical    Comment: tubal and [facility]  Other Topics Concern   Not on file  Social History Narrative   Not on file   Social Drivers of Health   Tobacco Use: Medium Risk (08/01/2024)   Patient History    Smoking Tobacco Use: Former    Smokeless Tobacco Use: Never    Passive Exposure: Not on file  Financial Resource Strain: Medium Risk (02/05/2024)   Overall Financial Resource Strain (  CARDIA)    Difficulty of Paying Living  Expenses: Somewhat hard  Food Insecurity: No Food Insecurity (02/05/2024)   Epic    Worried About Programme Researcher, Broadcasting/film/video in the Last Year: Never true    Ran Out of Food in the Last Year: Never true  Transportation Needs: No Transportation Needs (02/05/2024)   Epic    Lack of Transportation (Medical): No    Lack of Transportation (Non-Medical): No  Physical Activity: Insufficiently Active (02/05/2024)   Exercise Vital Sign    Days of Exercise per Week: 1 day    Minutes of Exercise per Session: 10 min  Stress: Stress Concern Present (02/05/2024)   [employer] of Occupational Health - Occupational Stress Questionnaire    Feeling of Stress: To some extent  Social Connections: Moderately Isolated (02/05/2024)   Social Connection and Isolation Panel    Frequency of Communication with Friends and Family: Three times a week    Frequency of Social Gatherings with Friends and Family: Patient declined    Attends Religious Services: Patient declined    Active Member of Clubs or Organizations: No    Attends Engineer, Structural: Not on file    Marital Status: Married  Catering Manager Violence: Not At Risk (10/18/2023)   Humiliation, Afraid, Rape, and Kick questionnaire    Fear of Current or Ex-Partner: No    Emotionally Abused: No    Physically Abused: No    Sexually Abused: No  Depression (PHQ2-9): Medium Risk (02/07/2024)   Depression (PHQ2-9)    PHQ-2 Score: 10  Alcohol Screen: Low Risk (10/18/2023)   Alcohol Screen    Last Alcohol Screening Score (AUDIT): 0  Housing: Low Risk (02/05/2024)   Epic    Unable to Pay for Housing in the Last Year: No    Number of Times Moved in the Last Year: 0    Homeless in the Last Year: No  Utilities: Not At Risk (10/18/2023)   [employer] Utilities    Threatened with loss of utilities: No  Health Literacy: Adequate Health Literacy (10/18/2023)   B1300 Health Literacy    Frequency of need for help with medical instructions: Never    Review of  Systems: All other review of systems negative except as mentioned in the HPI.  Physical Exam: Vital signs BP (!) 126/58   Temp 97.8 F (36.6 C) (Temporal)   Resp 11   Ht 5' 4 (1.626 m)   Wt 90.7 kg   SpO2 95%   BMI 34.33 kg/m   General:   Alert,  Well-developed, pleasant and cooperative in NAD Lungs:  Clear throughout to auscultation.   Heart:  Regular rate and rhythm Abdomen:  Soft, nontender and nondistended.   Neuro/Psych:  Alert and cooperative. Normal mood and affect. A and O x 3  Marcey Naval, MD [facility]

## 2024-08-01 NOTE — Anesthesia Postprocedure Evaluation (Signed)
 Anesthesia Post Note  Patient: Kimberly Obrien  Procedure(s) Performed: EGD (ESOPHAGOGASTRODUODENOSCOPY)     Patient location during evaluation: PACU Anesthesia Type: MAC Level of consciousness: awake and alert Pain management: pain level controlled Vital Signs Assessment: post-procedure vital signs reviewed and stable Respiratory status: spontaneous breathing, nonlabored ventilation, respiratory function stable and patient connected to nasal cannula oxygen  Cardiovascular status: blood pressure returned to baseline and stable Postop Assessment: no apparent nausea or vomiting Anesthetic complications: no   No notable events documented.  Last Vitals:  Vitals:   08/01/24 0750 08/01/24 0800  BP: (!) 114/53 (!) 102/49  Pulse: 65 68  Resp: 12 18  Temp:    SpO2: 95% 95%    Last Pain:  Vitals:   08/01/24 0800  TempSrc:   PainSc: 0-No pain                 Lynwood MARLA Cornea

## 2024-08-03 ENCOUNTER — Encounter (HOSPITAL_COMMUNITY): Payer: Self-pay | Admitting: Gastroenterology

## 2024-08-08 ENCOUNTER — Encounter: Payer: Self-pay | Admitting: Primary Care

## 2024-08-08 ENCOUNTER — Telehealth: Payer: Self-pay | Admitting: Gastroenterology

## 2024-08-08 ENCOUNTER — Ambulatory Visit: Admitting: Primary Care

## 2024-08-08 ENCOUNTER — Other Ambulatory Visit: Payer: Self-pay

## 2024-08-08 VITALS — BP 118/74 | HR 76 | Ht 64.5 in | Wt 204.0 lb

## 2024-08-08 DIAGNOSIS — K7460 Unspecified cirrhosis of liver: Secondary | ICD-10-CM

## 2024-08-08 DIAGNOSIS — G4733 Obstructive sleep apnea (adult) (pediatric): Secondary | ICD-10-CM | POA: Diagnosis not present

## 2024-08-08 DIAGNOSIS — R0602 Shortness of breath: Secondary | ICD-10-CM | POA: Diagnosis not present

## 2024-08-08 DIAGNOSIS — R002 Palpitations: Secondary | ICD-10-CM

## 2024-08-08 DIAGNOSIS — Z6834 Body mass index (BMI) 34.0-34.9, adult: Secondary | ICD-10-CM

## 2024-08-08 DIAGNOSIS — J449 Chronic obstructive pulmonary disease, unspecified: Secondary | ICD-10-CM | POA: Diagnosis not present

## 2024-08-08 DIAGNOSIS — E669 Obesity, unspecified: Secondary | ICD-10-CM | POA: Diagnosis not present

## 2024-08-08 NOTE — Telephone Encounter (Signed)
 Left message for pt regarding Dr. Hassan recommendations. Orders placed for labs and pt knows to come for labs.

## 2024-08-08 NOTE — Patient Instructions (Addendum)
" °  VISIT SUMMARY: Today, we discussed your ongoing issues with COPD and sleep apnea, as well as your challenges with weight management. We reviewed your symptoms, current treatments, and potential new strategies to help manage your conditions more effectively.  YOUR PLAN: -CHRONIC OBSTRUCTIVE PULMONARY DISEASE (COPD): COPD is a chronic lung disease that makes it hard to breathe, especially during physical activities. We will schedule a pulmonary function test after your insurance changes in January. Continue using your albuterol  inhaler as needed for shortness of breath or chest tightness, and consider using it before activities that cause these symptoms. We have also ordered an exercise tolerance test with pulmonary and cardiac monitoring to better understand your condition.  -OBSTRUCTIVE SLEEP APNEA: Obstructive sleep apnea is a condition where your breathing stops and starts during sleep, leading to poor sleep quality and daytime fatigue. We discussed potential treatment options, including weight loss medication and the Inspire device. You should discuss the weight loss medication (Zepbound) with your GI doctor at your upcoming appointment. If you are interested in the [place] device, we can consider a repeat home sleep study and refer you to an ENT specialist for further evaluation. Positional therapy with a wedge pillow may also help.  -OBESITY: Obesity is a condition where excess body fat negatively affects your health. Your BMI is 34, which is considered obese. Losing weight could improve your COPD and sleep apnea symptoms. We discussed the possibility of using a weight loss medication called Zepbound, pending clearance from your GI doctor due to your history of varices and GI issues. If cleared, we will consider starting Zepbound at 2.5 mg.  INSTRUCTIONS: Please follow up with your GI doctor to discuss the weight loss medication (Zepbound). We will schedule a pulmonary function test after your  insurance changes in January. If you are interested in the Inspire device for sleep apnea, let us  know so we can arrange a repeat home sleep study and refer you to an ENT specialist for further evaluation.   Follow-up:  3 months with Dr. Byrum or sooner if needed                            Contains text generated by [employer].   "

## 2024-08-08 NOTE — Telephone Encounter (Signed)
 I have some hesitation about prescribing GLP-1 agonist for her. For patients with compensated cirrhosis, use of this is okay in some circumstances but to be used with caution. Use of GLP-1 agonist for patients with decompensated cirrhosis is not recommended due to limited safety data in this circumstance. She has esophageal varices that has have been banded recently.  She has coagulopathy and mild elevation in bilirubin.  In fact she is due for basic labs, CBC, CMET, INR, AFP - will calculate MELD, if you can ask her to go to the lab and I can recheck her liver function studies.  Long-term, again because of her cirrhosis, that she has varices, will calculate her MELD, I am just concerned about long-term safety data in regards to the use of her case. I'd avoid it if possible. If using it for weight loss and she has not been to a weight loss center to have counseling on a calorie restricted diet etc. for goal weight loss I would recommend that in its place, at least first prior to pursuing a GLP-1 agonist

## 2024-08-08 NOTE — Telephone Encounter (Signed)
 Inbound call from patient stating that she was seen with her Pulmonologist  and needs the okay from Dr. Leigh to take Zepbound. Requesting a call back to discuss. Please advise.

## 2024-08-08 NOTE — Telephone Encounter (Signed)
 Pt states her pulmonologist wants to start her on zepbound  and they told her it can cause some gi issues and wanted her to check with Dr. Leigh to make sure he feels it is ok for her to take. Please advise.

## 2024-08-08 NOTE — Progress Notes (Unsigned)
 "  @Patient  ID: Kimberly Obrien, female    DOB: [birth year], 63 y.o.   MRN: [id number]  Chief Complaint  Patient presents with   Medical Management of Chronic Issues    Pt states no new concerns     Referring provider: Zollie Lowers, MD    08/08/2024 Discussed the use of AI scribe software for clinical note transcription with the patient, who gave verbal consent to proceed.  History of Present Illness Kimberly Obrien is a 63 year old female with COPD and sleep apnea who presents with difficulty breathing and sleep disturbances.  She has a history of COPD with difficulty breathing, particularly with exertion. She manages her symptoms at rest but experiences significant shortness of breath and chest tightness when bending down, carrying heavy objects, or walking up hills or stairs. She rates her breathlessness as a five when walking up a hill or a flight of stairs and reports being limited in her activities at home, rating this limitation as a three to four. She uses a rescue inhaler as needed, but not frequently.  She was previously prescribed Stiolto for her COPD but was unable to continue due to cost, only using samples for a couple of months without finding it helpful. She is currently out of the medication. Her last breathing test in 2016 showed a lung function of 65% predicted.  She also has sleep apnea and struggles with CPAP therapy. She experiences nocturnal symptoms and daytime fatigue. She has tried different CPAP masks and has undergone titration at a sleep lab. Her BMI is 34, placing her in the obese category.  She quit smoking in 2013. No cough or phlegm in her chest but experiences chest tightness with exertion, rating it as a three. No nocturnal coughing or mucus production that wakes her up. Her energy level is low, which she attributes to her sleep apnea and breathing difficulties.  Significant COPD, she was unable to get inhaler due to cost at her last visit in  september she was restarted on stiolto  Hx PE, optimally she would be on chronic anticoagulation but blood thinner was stopped due to GI bleeding. She remains off anticoagulation.  OSA unable to tolerate BIPAP and is no longer using. She is not interested in restarting.    Allergies[1]  Immunization History  Administered Date(s) Administered   Tdap 04/21/2011, 04/20/2017    Past Medical History:  Diagnosis Date   Anemia    Anticoagulation monitoring by pharmacist    Arthritis    Asthma    Blood transfusion without reported diagnosis    Cataract    removed both eyes   Cirrhosis (HCC)    Complication of anesthesia    hypotension   COPD (chronic obstructive pulmonary disease) (HCC)    Diabetes mellitus without complication (HCC)    no diabets since 08/2023   Dyspnea    exertion   Elevated hemoglobin A1c 03/01/2018   Family history of Hodgkin's lymphoma    Family history of leukemia    Family history of lung cancer    Family history of stomach cancer    Family history of uterine cancer    GERD (gastroesophageal reflux disease)    mild- diet related   Heart murmur    History of colon polyps    History of kidney stones    History of pulmonary embolism 02/04/2013   Hyperlipidemia    controlled   NASH (nonalcoholic steatohepatitis)    Neuromuscular disorder (HCC)    back surgery  PE (pulmonary embolism)    Routine Papanicolaou smear 09/12/2021   Sleep apnea    no cpap   Ureteral calculus, left 07/28/2021   UTI symptoms 08/18/2021    Tobacco History: Tobacco Use History[2] Counseling given: Not Answered   Outpatient Medications Prior to Visit  Medication Sig Dispense Refill   Accu-Chek Softclix Lancets lancets Test BS twice daily Dx e11.9 200 each 3   acetaminophen  (TYLENOL ) 500 MG tablet Take 500 mg by mouth every 6 (six) hours as needed.     albuterol  (VENTOLIN  HFA) 108 (90 Base) MCG/ACT inhaler Inhale 2 puffs into the lungs every 6 (six) hours as needed for  wheezing or shortness of breath. 18 g 0   alendronate  (FOSAMAX ) 70 MG tablet TAKE 1 TABLET BY MOUTH EVERY 7 DAYS. TAKE WITH A FULL GLASS OF WATER ON AN EMPTY STOMACH. 12 tablet 0   Blood Glucose Monitoring Suppl (ACCU-CHEK GUIDE ME) w/Device KIT Test BS twice daily Dx e11.9 1 kit 0   dicyclomine  (BENTYL ) 10 MG capsule Take 1 capsule (10 mg total) by mouth every 8 (eight) hours as needed for spasms. 60 capsule 1   famotidine  (PEPCID ) 20 MG tablet Take 1 tablet (20 mg total) by mouth 2 (two) times daily. 28 tablet 0   fexofenadine -pseudoephedrine (ALLEGRA -D 24) 180-240 MG 24 hr tablet Take 1 tablet by mouth every evening. For allergy and congestion 30 tablet 11   fluocinonide -emollient (LIDEX -E) 0.05 % cream Apply 1 Application topically 2 (two) times daily. 60 g 2   fluticasone  (FLONASE ) 50 MCG/ACT nasal spray Place 2 sprays into both nostrils daily. 48 g 1   glucose blood (ACCU-CHEK GUIDE TEST) test strip Test BS twice daily Dx e11.9 200 each 3   ipratropium-albuterol  (DUONEB) 0.5-2.5 (3) MG/3ML SOLN Take 3 mLs by nebulization every 6 (six) hours as needed. 360 mL 1   oxyCODONE  (OXY IR/ROXICODONE ) 5 MG immediate release tablet Take 1 tablet (5 mg total) by mouth every 4 (four) hours as needed for severe pain (pain score 7-10). 8 tablet 0   sucralfate  (CARAFATE ) 1 g tablet Take 1 tablet (1 g total) by mouth every 8 (eight) hours as needed. Dissolve 1 tablet in a small amount of water and drink, every 8 hours PRN for chest discomfort 42 tablet 0   Tiotropium Bromide-Olodaterol (STIOLTO RESPIMAT ) 2.5-2.5 MCG/ACT AERS Inhale 2 puffs into the lungs daily. 4 g 11   Tiotropium Bromide-Olodaterol (STIOLTO RESPIMAT ) 2.5-2.5 MCG/ACT AERS Inhale 2 puffs into the lungs daily.     No facility-administered medications prior to visit.      Review of Systems  Review of Systems  Constitutional:  Positive for fatigue.  HENT: Negative.    Respiratory:         Exertional dyspnea  Cardiovascular:  Positive for  palpitations.  Psychiatric/Behavioral:  Positive for sleep disturbance.      Physical Exam  BP 118/74   Pulse 76   Ht 5' 4.5 (1.638 m) Comment: per pt  Wt 204 lb (92.5 kg)   SpO2 96%   BMI 34.48 kg/m  Physical Exam Constitutional:      Appearance: Normal appearance. She is well-developed.  HENT:     Head: Normocephalic and atraumatic.     Mouth/Throat:     Mouth: Mucous membranes are moist.     Pharynx: Oropharynx is clear.  Eyes:     Pupils: Pupils are equal, round, and reactive to light.  Cardiovascular:     Rate and Rhythm: Normal rate and  regular rhythm.     Heart sounds: Normal heart sounds. No murmur heard. Pulmonary:     Effort: Pulmonary effort is normal. No respiratory distress.     Breath sounds: Normal breath sounds. No wheezing or rhonchi.     Comments: CTA Musculoskeletal:        General: Normal range of motion.     Cervical back: Normal range of motion and neck supple.  Skin:    General: Skin is warm and dry.     Findings: No erythema or rash.  Neurological:     General: No focal deficit present.     Mental Status: She is alert and oriented to person, place, and time. Mental status is at baseline.  Psychiatric:        Mood and Affect: Mood normal.        Behavior: Behavior normal.        Thought Content: Thought content normal.        Judgment: Judgment normal.      Lab Results:  CBC    Component Value Date/Time   WBC 4.8 02/07/2024 1636   WBC 5.7 06/23/2021 0645   RBC 4.75 02/07/2024 1636   RBC 4.74 06/23/2021 0645   HGB 14.7 02/07/2024 1636   HGB 15.0 04/24/2007 0925   HCT 43.3 02/07/2024 1636   HCT 41.7 04/24/2007 0925   PLT 66 (LL) 02/07/2024 1636   MCV 91 02/07/2024 1636   MCV 86.7 04/24/2007 0925   MCH 30.9 02/07/2024 1636   MCH 29.1 06/23/2021 0645   MCHC 33.9 02/07/2024 1636   MCHC 33.6 06/23/2021 0645   RDW 13.6 02/07/2024 1636   RDW 14.9 (H) 04/24/2007 0925   LYMPHSABS 0.9 02/07/2024 1636   LYMPHSABS 2.7 04/24/2007 0925    MONOABS 0.2 04/13/2021 1105   MONOABS 0.3 04/24/2007 0925   EOSABS 0.1 02/07/2024 1636   BASOSABS 0.0 02/07/2024 1636   BASOSABS 0.0 04/24/2007 0925    BMET    Component Value Date/Time   NA 145 (H) 02/07/2024 1636   K 4.1 02/07/2024 1636   CL 109 (H) 02/07/2024 1636   CO2 23 02/07/2024 1636   GLUCOSE 94 02/07/2024 1636   GLUCOSE 155 (H) 06/23/2021 0645   BUN 11 02/07/2024 1636   CREATININE 0.55 (L) 02/07/2024 1636   CREATININE 0.64 04/13/2021 1105   CREATININE 0.80 02/04/2013 1317   CALCIUM  8.7 02/07/2024 1636   GFRNONAA >60 06/23/2021 0645   GFRNONAA >60 04/13/2021 1105   GFRNONAA 85 02/04/2013 1317   GFRAA 117 08/18/2020 1210   GFRAA >60 04/07/2020 0919   GFRAA >89 02/04/2013 1317    BNP No results found for: BNP  ProBNP No results found for: PROBNP  Imaging: No results found.   Assessment & Plan:   No problem-specific Assessment & Plan notes found for this encounter.   1. Chronic obstructive pulmonary disease, unspecified COPD type (HCC) (Primary)  2. OSA (obstructive sleep apnea)   Assessment and Plan Assessment & Plan Chronic obstructive pulmonary disease Moderate obstructive lung disease with lung function at 65% predicted. Symptoms include exertional dyspnea, chest tightness, and limited activity tolerance. Previous trial of Stiolto was ineffective, possibly due to cost or seasonal factors. No recent pulmonary function tests since 2016. Rescue inhaler used infrequently. - Will schedule pulmonary function test after insurance change in January. - Continue using albuterol  as needed for dyspnea or chest tightness. - Consider using albuterol  before activities that cause dyspnea. - Ordered exercise tolerance test with pulmonary and cardiac  monitoring.  Obstructive sleep apnea Previous intolerance to CPAP therapy. Symptoms include nocturnal respiratory symptoms and daytime fatigue. BMI is 34, indicating obesity. Discussed potential treatment options  including weight loss medication and Inspire device. Weight loss could improve symptoms and reduce apnea events. Inspire device requires repeat sleep study and ENT evaluation. Discussed potential side effects of weight loss medication, including nausea, vomiting, diarrhea, and constipation. She prefers to attempt weight loss with medication before considering surgical options. - Discuss weight loss medication (Zepbound) with GI doctor at upcoming appointment. - Will consider repeat home sleep study if interested in [place] device. - Will refer to ENT for sleep endoscopy if pursuing Inspire device. - Consider positional therapy with wedge pillow.  Obesity BMI of 34. Discussed potential benefits of weight loss on COPD and sleep apnea symptoms. Consideration of GLP-1 receptor agonist (Zepbound) for weight loss, pending GI clearance due to history of varices and GI issues. She is unable to exercise due to dyspnea, making medication-assisted weight loss a viable option. - Discuss GLP-1 receptor agonist (Zepbound) with GI doctor at upcoming appointment. - Will consider starting Zepbound at 2.5 mg if GI clearance is obtained.  Recording duration: 21 minutes      I personally spent a total of *** minutes in the care of the patient today including {Time Based Coding:[id number]}.   Almarie LELON Ferrari, NP 08/08/2024     [1]  Allergies Allergen Reactions   Aciphex [Rabeprazole Sodium] Rash   Fluconazole Rash and Hives   Lansoprazole Hives   Septra [Sulfamethoxazole-Trimethoprim] Nausea And Vomiting    made me bleed   Sucralfate  Nausea Only   Claritin [Loratadine] Other (See Comments)    Tired    Crestor  [Rosuvastatin ] Other (See Comments)    Elevated blood glucose and abdominal pain   Doxycycline Rash   Ibuprofen Other (See Comments)    Upset stomach   Lipitor [Atorvastatin] Other (See Comments)    Myalgias, leg pain.  Problems with pancreas, sugars went up also.   Macrobid   [Nitrofurantoin ] Diarrhea   Metoprolol  Nausea And Vomiting   Metronidazole  Other (See Comments) and Nausea Only    Dizziness, nausea, dry mouth and some shortness of breath   Nsaids Other (See Comments)    Upset stomach   Omeprazole      bloating   Rabeprazole Rash   Vancomycin  Diarrhea  [2]  Social History Tobacco Use  Smoking Status Former   Current packs/day: 0.00   Average packs/day: 2.0 packs/day for 50.0 years (99.9 ttl pk-yrs)   Types: Cigarettes   Start date: 08/22/1977   Quit date: 04/21/2012   Years since quitting: 12.3  Smokeless Tobacco Never   "

## 2024-08-11 ENCOUNTER — Telehealth: Payer: Self-pay

## 2024-08-11 MED ORDER — ZEPBOUND 2.5 MG/0.5ML ~~LOC~~ SOAJ: 2.5000 mg | SUBCUTANEOUS | 1 refills | Status: AC

## 2024-08-11 NOTE — Telephone Encounter (Signed)
 Copied from CRM [id number]. Topic: Clinical - Medical Advice >> Aug 11, 2024  2:01 PM Devaughn RAMAN wrote: Reason for CRM: Pt stated she was advised by NP Hope to inform her of speaking with her GI provider regarding (Zepbound ) medication. Pt stated her GI provider advised her it is okay to take (Zepbound ) medication.   Per lov  We discussed the possibility of using a weight loss medication called Zepbound , pending clearance from your GI doctor due to your history of varices and GI issues. If cleared, we will consider starting Zepbound  at 2.5 mg.  Please advise [employer]

## 2024-08-11 NOTE — Telephone Encounter (Signed)
 I will send if, if covered start 2.5mg  [place] weekly on the same day. Needs follow-up in 4-6 weeks to see how she is tolerating medication and increase dose   Please provide patient with education via mychart or mail

## 2024-08-12 NOTE — Telephone Encounter (Signed)
 Sent via Mychart.

## 2024-08-12 NOTE — Telephone Encounter (Signed)
ATC x1.  LMTCB. 

## 2024-08-25 ENCOUNTER — Telehealth: Payer: Self-pay | Admitting: Primary Care

## 2024-08-25 DIAGNOSIS — R0602 Shortness of breath: Secondary | ICD-10-CM

## 2024-08-25 DIAGNOSIS — R002 Palpitations: Secondary | ICD-10-CM

## 2024-08-25 NOTE — Telephone Encounter (Signed)
-----   Message from Olam ORN sent at 08/18/2024  8:13 AM EST ----- Please create an Order for an ATTESTATION ( [id number]) for ordered Myoview/GXT/Stress Echocardiogram.  This must be signed by ordering Provider.  We do not accept verbal cosign.  This test will not be scheduled until ATT is obtained.

## 2024-08-25 NOTE — Telephone Encounter (Signed)
" ° °  Authorizing Provider [address] [place]  Date/Time Authorizing Provider Changed by  08/08/2024 11:14 AM Hope Almarie ORN, NP Hope Almarie ORN, NP   Order History Outpatient Date/Time Action Taken User Additional Information  08/08/24 1114 E-Sign Hope Almarie ORN, NP     I ordered and signed this already on 08/08/24 "

## 2024-08-25 NOTE — Telephone Encounter (Signed)
 Kimberly Obrien, Kimberly Obrien needs for you to do a Attestation before this patient can be scheduled for their Exercise Tolerance Test. Please Advise. Thanks !

## 2024-08-25 NOTE — Telephone Encounter (Signed)
 An attestation of what? What are they needing me to say

## 2024-08-28 ENCOUNTER — Encounter: Payer: Self-pay | Admitting: Hematology

## 2024-08-29 ENCOUNTER — Other Ambulatory Visit: Payer: Self-pay

## 2024-08-29 DIAGNOSIS — Z87891 Personal history of nicotine dependence: Secondary | ICD-10-CM

## 2024-08-29 DIAGNOSIS — Z122 Encounter for screening for malignant neoplasm of respiratory organs: Secondary | ICD-10-CM

## 2024-08-29 NOTE — Telephone Encounter (Signed)
 Im not sure the process for Attestation. It's new for me aswell. I would communicate with Olam since she's more aware. All I know is that it's an order that needs to be made.

## 2024-08-29 NOTE — Telephone Encounter (Signed)
 Can you send this to lisa and ask

## 2024-08-30 ENCOUNTER — Encounter: Payer: Self-pay | Admitting: Gastroenterology

## 2024-09-01 ENCOUNTER — Other Ambulatory Visit (HOSPITAL_COMMUNITY): Payer: Self-pay

## 2024-09-01 ENCOUNTER — Telehealth: Payer: Self-pay

## 2024-09-01 ENCOUNTER — Encounter: Payer: Self-pay | Admitting: Hematology

## 2024-09-01 NOTE — Telephone Encounter (Signed)
*  Pulm  Pharmacy Patient Advocate Encounter   Received notification from Fax that prior authorization for Zepbound  2.5mg  pen is required/requested.   Insurance verification completed.   The patient is insured through [facility].   Per test claim: The current 28 day co-pay is, $1,046.81.  No PA needed at this time. This test claim was processed through [facility]- copay amounts may vary at other pharmacies due to pharmacy/plan contracts, or as the patient moves through the different stages of their insurance plan.

## 2024-09-02 ENCOUNTER — Ambulatory Visit: Payer: Self-pay

## 2024-09-02 ENCOUNTER — Other Ambulatory Visit: Payer: Self-pay | Admitting: Primary Care

## 2024-09-02 DIAGNOSIS — R002 Palpitations: Secondary | ICD-10-CM

## 2024-09-02 DIAGNOSIS — R0602 Shortness of breath: Secondary | ICD-10-CM

## 2024-09-02 DIAGNOSIS — R0609 Other forms of dyspnea: Secondary | ICD-10-CM

## 2024-09-02 NOTE — Telephone Encounter (Signed)
 Appointment scheduled.

## 2024-09-02 NOTE — Telephone Encounter (Signed)
 Olam I'm not sure what you need me to do, I have placed the order. My [facility] should be able to help you. Thank you. I have placed order and signed.

## 2024-09-02 NOTE — Telephone Encounter (Signed)
 I entered the order myself and signed it. Please figure this out. There is nothing on my end

## 2024-09-02 NOTE — Telephone Encounter (Signed)
 I want an exercise stress test (treadmill)

## 2024-09-02 NOTE — Telephone Encounter (Signed)
 Advised home care but pt is requesting to be seen by Dr. Zollie.   FYI Only or Action Required?: FYI only for provider: appointment scheduled on 1/19.  Patient was last seen in primary care on 02/07/2024 by Zollie Lowers, MD.  Called Nurse Triage reporting URI.  Symptoms began a week ago.  Interventions attempted: OTC medications: sudafed, flonase  and Prescription medications: inhaler.  Symptoms are: stable.  Triage Disposition: Home Care  Patient/caregiver understands and will follow disposition?: No, wishes to speak with PCP   Copied from CRM #[id number]. Topic: Clinical - Red Word Triage >> Sep 02, 2024  3:52 PM Miquel SAILOR wrote: Red Word that prompted transfer to Nurse Triage: PT has severe Cold/COPD as well/can not sleep/Cogestion/going on for 1 week Reason for Disposition  Cough with cold symptoms (e.g., runny nose, postnasal drip, throat clearing)  Answer Assessment - Initial Assessment Questions 1. ONSET: When did the cough begin?      1 week   2. SEVERITY: How bad is the cough today?      It's not terrible  3. SPUTUM: Describe the color of your sputum (e.g., none, dry cough; clear, white, yellow, green)     Yeah but it's not yellow or nothing, thick but clear   4. HEMOPTYSIS: Are you coughing up any blood? If Yes, ask: How much? (e.g., flecks, streaks, tablespoons, etc.)     Denies  5. DIFFICULTY BREATHING: Are you having difficulty breathing? If Yes, ask: How bad is it? (e.g., mild, moderate, severe)      Yeah a little but I have COPD and emphysema  6. FEVER: Do you have a fever? If Yes, ask: What is your temperature, how was it measured, and when did it start? Denies  8. LUNG HISTORY: Do you have any history of lung disease?  (e.g., pulmonary embolus, asthma, emphysema)     COPD and emphysema   10. OTHER SYMPTOMS: Do you have any other symptoms? (e.g., runny nose, wheezing, chest pain)       Sinus drainage  Taking Flonase  and using  inhaler, sudafed  Protocols used: Cough - Acute Productive-A-AH

## 2024-09-02 NOTE — Telephone Encounter (Signed)
 I'm at a loss for what you need from me. I'm sending this to [place] [facility] [place]. I placed the order for exercise stress test and signed it.

## 2024-09-02 NOTE — Telephone Encounter (Signed)
 Order has been placed in a separate encounter

## 2024-09-02 NOTE — Progress Notes (Signed)
 Myoview/GXT/Stress Echocardiogram ordered

## 2024-09-03 NOTE — Telephone Encounter (Signed)
 There is a self pay option for zepbound  starting at 299 to $449, if patient wanted to consider this prescription would be [facility].   Otherwise, she should follow up with primary care, they could try prescribing a different GLP medication under weight management which may be more affordable in the Spring. There is a new oral GLP medication which may be more affordable.

## 2024-09-03 NOTE — Telephone Encounter (Signed)
 Lebron was able to researched this and helped me put attestation order in. All set, should be able to schedule. Thanks

## 2024-09-03 NOTE — Telephone Encounter (Signed)
 I called and spoke to pt. I informed pt of the pharmacy's note and pt verbalized understanding. I informed pt that I could route this to her provider and see what is better recommended since pt is not willing to pay the co-pay of over $1,000.

## 2024-09-03 NOTE — Addendum Note (Signed)
 Addended by: HOPE ALMARIE ORN on: 09/03/2024 03:46 PM   Modules accepted: Orders

## 2024-09-03 NOTE — Telephone Encounter (Signed)
 I have research this. It appears that a consent form from the provider is needed.  I will discuss further with Beth this afternoon after clinic.

## 2024-09-08 ENCOUNTER — Encounter: Payer: Self-pay | Admitting: Family Medicine

## 2024-09-08 ENCOUNTER — Ambulatory Visit: Admitting: Family Medicine

## 2024-09-08 VITALS — BP 132/71 | HR 73 | Temp 98.0°F | Ht 64.5 in | Wt 201.0 lb

## 2024-09-08 DIAGNOSIS — J40 Bronchitis, not specified as acute or chronic: Secondary | ICD-10-CM

## 2024-09-08 DIAGNOSIS — E1169 Type 2 diabetes mellitus with other specified complication: Secondary | ICD-10-CM

## 2024-09-08 DIAGNOSIS — E756 Lipid storage disorder, unspecified: Secondary | ICD-10-CM

## 2024-09-08 DIAGNOSIS — J329 Chronic sinusitis, unspecified: Secondary | ICD-10-CM | POA: Diagnosis not present

## 2024-09-08 DIAGNOSIS — E785 Hyperlipidemia, unspecified: Secondary | ICD-10-CM | POA: Diagnosis not present

## 2024-09-08 DIAGNOSIS — I700 Atherosclerosis of aorta: Secondary | ICD-10-CM | POA: Diagnosis not present

## 2024-09-08 MED ORDER — ALBUTEROL SULFATE (2.5 MG/3ML) 0.083% IN NEBU: 2.5000 mg | INHALATION_SOLUTION | Freq: Four times a day (QID) | RESPIRATORY_TRACT | 1 refills | Status: AC | PRN

## 2024-09-08 MED ORDER — HYDROCODONE BIT-HOMATROP MBR 5-1.5 MG/5ML PO SOLN: 5.0000 mL | Freq: Four times a day (QID) | ORAL | 0 refills | Status: AC | PRN

## 2024-09-08 MED ORDER — MOXIFLOXACIN HCL 400 MG PO TABS: 400.0000 mg | ORAL_TABLET | Freq: Every day | ORAL | 0 refills | Status: AC

## 2024-09-08 NOTE — Progress Notes (Signed)
 "  Subjective:  Patient ID: Kimberly Obrien, female    DOB: 05-06-1961  Age: 64 y.o. MRN: [id number]  CC: sick (Started with allergies on minor cold two weeks ago. Now has chest pressure, hard cough, thick mucous,sinus pressure and SOB. Right lung hurts. Tried flonase , 3 days of prednisone , Vick's, and sudafed. )   HPI  Discussed the use of AI scribe software for clinical note transcription with the patient, who gave verbal consent to proceed.  History of Present Illness Kimberly Obrien is a 64 year old female who presents with cough and right-sided chest pain.  She has experienced a cough for two weeks, with yellowish-green sputum production for the past week. The cough is associated with pain at the base of the right lung, which began one to two weeks ago. The pain is localized around the ribs and worsens with coughing. No rash is present in the area of pain.  She experiences shortness of breath upon exertion but not at rest. She also mentions an intermittent mild earache.  She has a history of using an inhaler and has previously been on prednisone , which she is currently out of. She also has a hernia that may be aggravated by the coughing. She possesses a nebulizer but notes that the medication for it is out of date.  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          09/08/2024    3:29 PM 02/07/2024    3:41 PM 02/07/2024    3:35 PM  Depression screen PHQ 2/9  Decreased Interest 0 1 0  Down, Depressed, Hopeless 0 0 0  PHQ - 2 Score 0 1 0  Altered sleeping 3 3 0  Tired, decreased energy 3 3 0  Change in appetite 0 0 0  Feeling bad or failure about yourself  0 0 0  Trouble concentrating 3 3 0  Moving slowly or fidgety/restless 0 0 0  Suicidal thoughts 0 0 0  PHQ-9 Score 9 10  0   Difficult doing work/chores Somewhat difficult Somewhat difficult Not difficult at all      Data saved with a previous flowsheet row definition    History Kimberly Obrien has a past medical history of Anemia, Anticoagulation monitoring by pharmacist, Arthritis, Asthma, Blood transfusion without reported diagnosis, Cataract, Cirrhosis (HCC), Complication of anesthesia, COPD (chronic obstructive pulmonary disease) (HCC), Diabetes mellitus without complication (HCC), Dyspnea, Elevated hemoglobin A1c (03/01/2018), Family history of Hodgkin's lymphoma, Family history of leukemia, Family history of lung cancer, Family history of stomach cancer, Family history of uterine cancer, GERD (gastroesophageal reflux disease), Heart murmur, History of colon polyps, History of kidney stones, History of pulmonary embolism (02/04/2013), Hyperlipidemia, NASH (nonalcoholic steatohepatitis), Neuromuscular disorder (HCC), PE (pulmonary embolism), Routine Papanicolaou smear (09/12/2021), Sleep apnea, Ureteral calculus, left (07/28/2021), and UTI symptoms (08/18/2021).   She has a past surgical history that includes Tubal ligation; Wrist surgery (Right); Cholecystectomy; Spine surgery; Abdominal hysterectomy (2009); left and right heart catheterization with coronary angiogram (N/A, 05/13/2014); Incontinence surgery (2009); Cataract extraction, bilateral (Bilateral); Esophagogastroduodenoscopy (egd) with propofol  (N/A, 05/07/2017); Colonoscopy with propofol  (N/A, 05/07/2017); IR Venogram Hepatic W Hemodynamic Evaluation (03/15/2018); IR Transcatheter BX (03/15/2018); Breast biopsy (Left); Esophagogastroduodenoscopy (N/A, 03/13/2024); esophageal banding (N/A, 03/13/2024); Esophagogastroduodenoscopy (N/A, 05/12/2024); gastric varices banding (N/A, 05/12/2024); and Esophagogastroduodenoscopy (N/A, 08/01/2024).   Her family history includes Bipolar disorder in her father; Cancer in her cousin, maternal uncle,  paternal aunt, paternal aunt, and paternal uncle; Cirrhosis in her mother; Colon polyps in her sister; Endometrial cancer (age of  onset: 71) in her daughter; Heart disease in her father; Heart failure in her father and paternal grandfather; Hodgkin's lymphoma (age of onset: 73) in an other family member; Hyperlipidemia in her father; Hypertension in her brother; Irritable bowel syndrome in her mother and sister; Leukemia in her cousin; Lung cancer in her maternal uncle; Obesity in her daughter; Stomach cancer in her maternal uncle.She reports that she quit smoking about 12 years ago. Her smoking use included cigarettes. She started smoking about 47 years ago. She has a 99.9 pack-year smoking history. She has never used smokeless tobacco. She reports that she does not drink alcohol and does not use drugs.    ROS Review of Systems  Constitutional:  Negative for activity change, appetite change, chills and fever.  HENT:  Positive for congestion, postnasal drip, rhinorrhea and sinus pressure. Negative for ear discharge, ear pain, hearing loss, nosebleeds, sneezing and trouble swallowing.   Respiratory:  Positive for cough. Negative for chest tightness and shortness of breath.   Cardiovascular:  Negative for chest pain and palpitations.  Skin:  Negative for rash.    Objective:  BP 132/71   Pulse 73   Temp 98 F (36.7 C)   Ht 5' 4.5 (1.638 m)   Wt 201 lb (91.2 kg)   SpO2 92%   BMI 33.97 kg/m   BP Readings from Last 3 Encounters:  09/08/24 132/71  08/08/24 118/74  08/01/24 (!) 102/49    Wt Readings from Last 3 Encounters:  09/08/24 201 lb (91.2 kg)  08/08/24 204 lb (92.5 kg)  08/01/24 200 lb (90.7 kg)     Physical Exam Constitutional:      General: She is not in acute distress.    Appearance: Normal appearance. She is well-developed.  HENT:     Head: Normocephalic and atraumatic.     Right Ear: External ear normal.     Left Ear: External ear normal.     Nose: Nose normal.  Eyes:     Conjunctiva/sclera: Conjunctivae normal.     Pupils: Pupils are equal, round, and reactive to light.  Neck:      Thyroid : No thyromegaly.  Cardiovascular:     Rate and Rhythm: Normal rate and regular rhythm.     Heart sounds: Normal heart sounds. No murmur heard. Pulmonary:     Effort: Pulmonary effort is normal. No respiratory distress.     Breath sounds: Normal breath sounds. No wheezing or rales.  Chest:  Breasts:    Breasts are symmetrical.     Right: No inverted nipple, mass or tenderness.     Left: No inverted nipple, mass or tenderness.  Abdominal:     General: Bowel sounds are normal. There is no distension or abdominal bruit.     Palpations: Abdomen is soft. There is no hepatomegaly, splenomegaly or mass.     Tenderness: There is no abdominal tenderness. Negative signs include Murphy's sign and McBurney's sign.  Musculoskeletal:        General: No tenderness. Normal range of motion.     Cervical back: Normal range of motion and neck supple.  Lymphadenopathy:     Cervical: No cervical adenopathy.  Skin:    General: Skin is warm and dry.     Findings: No rash.  Neurological:     Mental Status: She is alert and oriented to person, place, and time.  Deep Tendon Reflexes: Reflexes are normal and symmetric.  Psychiatric:        Behavior: Behavior normal.        Thought Content: Thought content normal.        Judgment: Judgment normal.    Physical Exam GENERAL: Alert, cooperative, well developed, no acute distress. HEENT: Normocephalic, normal oropharynx, moist mucous membranes. CHEST: Clear to auscultation bilaterally, no wheezes, rhonchi, or crackles. CARDIOVASCULAR: Normal heart rate and rhythm, S1 and S2 normal without murmurs. ABDOMEN: Soft, non-distended, tenderness in right rib area on palpation, without organomegaly, normal bowel sounds. EXTREMITIES: No cyanosis or edema. NEUROLOGICAL: Cranial nerves grossly intact, moves all extremities without gross motor or sensory deficit.   Assessment & Plan:  Diabetic lipidosis (HCC)  Aortic atherosclerosis  Hyperlipidemia,  unspecified hyperlipidemia type  Sinobronchitis -     DG Chest 2 View; Future -     CBC with Differential/Platelet -     CMP14+EGFR  Other orders -     Moxifloxacin  HCl; Take 1 tablet (400 mg total) by mouth daily.  Dispense: 10 tablet; Refill: 0 -     HYDROcodone  Bit-Homatrop MBr; Take 5 mLs by mouth every 6 (six) hours as needed for up to 5 days for cough.  Dispense: 100 mL; Refill: 0 -     Albuterol  Sulfate; Take 3 mLs (2.5 mg total) by nebulization every 6 (six) hours as needed for wheezing or shortness of breath.  Dispense: 150 mL; Refill: 1    Assessment and Plan Assessment & Plan Acute sinobronchitis   She presents with a two-week history of cough with yellow-green sputum, now resolved for about a week. She reports pain at the base of the right lung for one to two weeks, exacerbated by coughing, and experiences dyspnea with exertion. Pain is localized around the ribs, possibly related to a hernia. Differential includes pneumonia, with coughing potentially aggravating existing conditions. Ordered a chest x-ray to evaluate lung condition and rule out pneumonia, and blood work to assess liver function and other relevant parameters. Prescribed antibiotics for potential bacterial infection and strong cough syrup for symptomatic relief, with caution due to potential drowsiness. Advised use of inhaler as needed for dyspnea and prescribed nebulizer medication for respiratory support. Instructed to pick up medications from pharmacy and start treatment immediately.       Follow-up: No follow-ups on file.  Butler Der, M.D. "

## 2024-09-09 ENCOUNTER — Other Ambulatory Visit

## 2024-09-09 ENCOUNTER — Ambulatory Visit

## 2024-09-09 ENCOUNTER — Other Ambulatory Visit: Payer: Self-pay | Admitting: Family Medicine

## 2024-09-09 ENCOUNTER — Telehealth: Payer: Self-pay

## 2024-09-09 DIAGNOSIS — J329 Chronic sinusitis, unspecified: Secondary | ICD-10-CM | POA: Diagnosis not present

## 2024-09-09 DIAGNOSIS — J40 Bronchitis, not specified as acute or chronic: Secondary | ICD-10-CM | POA: Diagnosis not present

## 2024-09-09 LAB — CMP14+EGFR
ALT: 39 IU/L — ABNORMAL HIGH (ref 0–32)
AST: 43 IU/L — ABNORMAL HIGH (ref 0–40)
Albumin: 3.7 g/dL — ABNORMAL LOW (ref 3.9–4.9)
Alkaline Phosphatase: 93 IU/L (ref 49–135)
BUN/Creatinine Ratio: 12 (ref 12–28)
BUN: 9 mg/dL (ref 8–27)
Bilirubin Total: 3.3 mg/dL — ABNORMAL HIGH (ref 0.0–1.2)
CO2: 24 mmol/L (ref 20–29)
Calcium: 8.5 mg/dL — ABNORMAL LOW (ref 8.7–10.3)
Chloride: 106 mmol/L (ref 96–106)
Creatinine, Ser: 0.73 mg/dL (ref 0.57–1.00)
Globulin, Total: 2.3 g/dL (ref 1.5–4.5)
Glucose: 115 mg/dL — ABNORMAL HIGH (ref 70–99)
Potassium: 3.6 mmol/L (ref 3.5–5.2)
Sodium: 145 mmol/L — ABNORMAL HIGH (ref 134–144)
Total Protein: 6 g/dL (ref 6.0–8.5)
eGFR: 92 mL/min/1.73

## 2024-09-09 LAB — CBC WITH DIFFERENTIAL/PLATELET
Basophils Absolute: 0 x10E3/uL (ref 0.0–0.2)
Basos: 1 %
EOS (ABSOLUTE): 0.1 x10E3/uL (ref 0.0–0.4)
Eos: 1 %
Hematocrit: 45.6 % (ref 34.0–46.6)
Hemoglobin: 15.2 g/dL (ref 11.1–15.9)
Immature Grans (Abs): 0 x10E3/uL (ref 0.0–0.1)
Immature Granulocytes: 0 %
Lymphocytes Absolute: 1.1 x10E3/uL (ref 0.7–3.1)
Lymphs: 15 %
MCH: 29.9 pg (ref 26.6–33.0)
MCHC: 33.3 g/dL (ref 31.5–35.7)
MCV: 90 fL (ref 79–97)
Monocytes Absolute: 0.4 x10E3/uL (ref 0.1–0.9)
Monocytes: 6 %
Neutrophils Absolute: 6 x10E3/uL (ref 1.4–7.0)
Neutrophils: 77 %
Platelets: 68 x10E3/uL — CL (ref 150–450)
RBC: 5.08 x10E6/uL (ref 3.77–5.28)
RDW: 13.2 % (ref 11.7–15.4)
WBC: 7.7 x10E3/uL (ref 3.4–10.8)

## 2024-09-09 MED ORDER — AMOXICILLIN 875 MG PO TABS: 875.0000 mg | ORAL_TABLET | Freq: Two times a day (BID) | ORAL | 0 refills | Status: AC

## 2024-09-09 NOTE — Telephone Encounter (Signed)
I sent in a replacement 

## 2024-09-09 NOTE — Telephone Encounter (Signed)
 I called and spoke to pt. Pt informed of of Beth's note and verbalized understanding. Pt states she still could not afford the self pay option and states that her PCP would most likely not prescribe the medication to her as pt stated  her PCP thinks she should  run marathons I informed pt that it still may be beneficial that she speaks to her PCP and we could go from there. Pt verbalized understanding. NFN

## 2024-09-09 NOTE — Telephone Encounter (Signed)
 Pt reports Moxifloxacin  caused pt to be very drowsy and sleep for a long period of time. Can you call in a alternative?  Also pt got x rays today so be on the look out for those results.

## 2024-09-09 NOTE — Telephone Encounter (Signed)
 I did not see a pneumonia on XR. Please try the avelox  again. Drowsiness is not a typical side effect. It is more likely the illness itself. If it happens again let me know and I will make a change

## 2024-09-10 NOTE — Telephone Encounter (Signed)
Pt notified.    LS

## 2024-09-11 ENCOUNTER — Ambulatory Visit: Payer: Self-pay | Admitting: Family Medicine

## 2024-09-15 NOTE — Progress Notes (Signed)
 Your chest x-ray looked normal. Thanks, WS.

## 2024-09-19 ENCOUNTER — Telehealth (HOSPITAL_COMMUNITY): Payer: Self-pay | Admitting: *Deleted

## 2024-09-19 NOTE — Telephone Encounter (Signed)
 Spoke to patient about her GXT that is scheduled for 09/26/24 at 11;30. She said she would try to come if her roads were cleared. I did ask if she was not able to come to please give us  a call and reschedule.

## 2024-09-26 ENCOUNTER — Ambulatory Visit (HOSPITAL_COMMUNITY): Admission: RE | Admit: 2024-09-26

## 2024-09-26 DIAGNOSIS — R002 Palpitations: Secondary | ICD-10-CM

## 2024-09-26 DIAGNOSIS — R0602 Shortness of breath: Secondary | ICD-10-CM

## 2024-09-26 DIAGNOSIS — J449 Chronic obstructive pulmonary disease, unspecified: Secondary | ICD-10-CM

## 2024-09-26 LAB — EXERCISE TOLERANCE TEST
Angina Index: 0
Duke Treadmill Score: 3
Estimated workload: 5
Exercise duration (min): 3 min
Exercise duration (sec): 21 s
MPHR: 156 {beats}/min
Peak HR: 155 {beats}/min
Percent HR: 99 %
RPE: 18
Rest HR: 71 {beats}/min
ST Depression (mm): 0 mm

## 2024-10-03 ENCOUNTER — Ambulatory Visit: Admitting: Gastroenterology

## 2024-10-21 ENCOUNTER — Ambulatory Visit: Payer: Self-pay

## 2024-10-27 ENCOUNTER — Encounter

## 2024-11-06 ENCOUNTER — Ambulatory Visit: Admitting: Emergency Medicine

## 2024-11-07 ENCOUNTER — Ambulatory Visit: Admitting: Emergency Medicine

## 2024-11-14 ENCOUNTER — Ambulatory Visit (HOSPITAL_COMMUNITY)
